# Patient Record
Sex: Female | Born: 1945 | Race: Black or African American | Hispanic: No | Smoking: Former smoker
Health system: Southern US, Community
[De-identification: ages and names within clinical notes are randomized; demographics above are authoritative.]

## PROBLEM LIST (undated history)

## (undated) DIAGNOSIS — J40 Bronchitis, not specified as acute or chronic: Secondary | ICD-10-CM

## (undated) DIAGNOSIS — E785 Hyperlipidemia, unspecified: Secondary | ICD-10-CM

## (undated) DIAGNOSIS — N186 End stage renal disease: Secondary | ICD-10-CM

## (undated) DIAGNOSIS — Z531 Procedure and treatment not carried out because of patient's decision for reasons of belief and group pressure: Secondary | ICD-10-CM

## (undated) DIAGNOSIS — E039 Hypothyroidism, unspecified: Secondary | ICD-10-CM

## (undated) DIAGNOSIS — M79606 Pain in leg, unspecified: Secondary | ICD-10-CM

## (undated) DIAGNOSIS — C801 Malignant (primary) neoplasm, unspecified: Secondary | ICD-10-CM

## (undated) DIAGNOSIS — I10 Essential (primary) hypertension: Secondary | ICD-10-CM

## (undated) DIAGNOSIS — R58 Hemorrhage, not elsewhere classified: Secondary | ICD-10-CM

## (undated) DIAGNOSIS — IMO0001 Reserved for inherently not codable concepts without codable children: Secondary | ICD-10-CM

## (undated) DIAGNOSIS — E079 Disorder of thyroid, unspecified: Secondary | ICD-10-CM

## (undated) DIAGNOSIS — I739 Peripheral vascular disease, unspecified: Secondary | ICD-10-CM

## (undated) DIAGNOSIS — J449 Chronic obstructive pulmonary disease, unspecified: Secondary | ICD-10-CM

## (undated) DIAGNOSIS — I639 Cerebral infarction, unspecified: Secondary | ICD-10-CM

## (undated) DIAGNOSIS — R197 Diarrhea, unspecified: Secondary | ICD-10-CM

## (undated) DIAGNOSIS — M1990 Unspecified osteoarthritis, unspecified site: Secondary | ICD-10-CM

## (undated) DIAGNOSIS — Z992 Dependence on renal dialysis: Secondary | ICD-10-CM

## (undated) HISTORY — PX: DG AV DIALYSIS SHUNT ACCESS EXIST*R* OR: HXRAD911

## (undated) HISTORY — DX: Cerebral infarction, unspecified: I63.9

## (undated) HISTORY — DX: Bronchitis, not specified as acute or chronic: J40

## (undated) HISTORY — DX: Pain in leg, unspecified: M79.606

## (undated) HISTORY — PX: CHOLECYSTECTOMY: SHX55

## (undated) HISTORY — DX: Hyperlipidemia, unspecified: E78.5

---

## 2000-09-28 ENCOUNTER — Ambulatory Visit (HOSPITAL_COMMUNITY): Admission: RE | Admit: 2000-09-28 | Discharge: 2000-09-28 | Payer: Self-pay | Admitting: Internal Medicine

## 2000-09-28 ENCOUNTER — Encounter (INDEPENDENT_AMBULATORY_CARE_PROVIDER_SITE_OTHER): Payer: Self-pay | Admitting: Internal Medicine

## 2000-09-29 ENCOUNTER — Encounter: Payer: Self-pay | Admitting: Internal Medicine

## 2000-10-16 ENCOUNTER — Ambulatory Visit (HOSPITAL_COMMUNITY): Admission: RE | Admit: 2000-10-16 | Discharge: 2000-10-16 | Payer: Self-pay | Admitting: Internal Medicine

## 2000-10-16 ENCOUNTER — Encounter: Payer: Self-pay | Admitting: Internal Medicine

## 2001-07-31 ENCOUNTER — Encounter: Payer: Self-pay | Admitting: Internal Medicine

## 2001-07-31 ENCOUNTER — Ambulatory Visit (HOSPITAL_COMMUNITY): Admission: RE | Admit: 2001-07-31 | Discharge: 2001-07-31 | Payer: Self-pay | Admitting: Internal Medicine

## 2004-12-29 ENCOUNTER — Ambulatory Visit (HOSPITAL_COMMUNITY): Admission: RE | Admit: 2004-12-29 | Discharge: 2004-12-29 | Payer: Self-pay | Admitting: Ophthalmology

## 2005-05-30 ENCOUNTER — Encounter (INDEPENDENT_AMBULATORY_CARE_PROVIDER_SITE_OTHER): Payer: Self-pay | Admitting: Internal Medicine

## 2005-05-30 LAB — CONVERTED CEMR LAB: Pap Smear: NORMAL

## 2005-06-02 ENCOUNTER — Encounter (INDEPENDENT_AMBULATORY_CARE_PROVIDER_SITE_OTHER): Payer: Self-pay | Admitting: Internal Medicine

## 2005-06-02 ENCOUNTER — Ambulatory Visit (HOSPITAL_COMMUNITY): Admission: RE | Admit: 2005-06-02 | Discharge: 2005-06-02 | Payer: Self-pay | Admitting: Obstetrics & Gynecology

## 2005-06-14 ENCOUNTER — Encounter (INDEPENDENT_AMBULATORY_CARE_PROVIDER_SITE_OTHER): Payer: Self-pay | Admitting: Internal Medicine

## 2005-06-14 ENCOUNTER — Ambulatory Visit (HOSPITAL_COMMUNITY): Admission: RE | Admit: 2005-06-14 | Discharge: 2005-06-14 | Payer: Self-pay | Admitting: Nephrology

## 2005-06-16 ENCOUNTER — Ambulatory Visit: Payer: Self-pay | Admitting: *Deleted

## 2005-06-21 ENCOUNTER — Ambulatory Visit (HOSPITAL_COMMUNITY): Admission: RE | Admit: 2005-06-21 | Discharge: 2005-06-22 | Payer: Self-pay | Admitting: Ophthalmology

## 2005-06-21 ENCOUNTER — Ambulatory Visit: Payer: Self-pay | Admitting: Cardiovascular Disease

## 2005-06-21 ENCOUNTER — Encounter (INDEPENDENT_AMBULATORY_CARE_PROVIDER_SITE_OTHER): Payer: Self-pay | Admitting: Internal Medicine

## 2005-06-24 ENCOUNTER — Ambulatory Visit: Payer: Self-pay | Admitting: *Deleted

## 2005-06-24 ENCOUNTER — Encounter (INDEPENDENT_AMBULATORY_CARE_PROVIDER_SITE_OTHER): Payer: Self-pay | Admitting: Internal Medicine

## 2005-06-24 ENCOUNTER — Encounter (HOSPITAL_COMMUNITY): Admission: RE | Admit: 2005-06-24 | Discharge: 2005-07-24 | Payer: Self-pay | Admitting: *Deleted

## 2005-07-04 ENCOUNTER — Encounter (INDEPENDENT_AMBULATORY_CARE_PROVIDER_SITE_OTHER): Payer: Self-pay | Admitting: Internal Medicine

## 2005-07-07 ENCOUNTER — Ambulatory Visit: Payer: Self-pay | Admitting: *Deleted

## 2005-07-07 ENCOUNTER — Encounter (INDEPENDENT_AMBULATORY_CARE_PROVIDER_SITE_OTHER): Payer: Self-pay | Admitting: Internal Medicine

## 2005-07-13 ENCOUNTER — Ambulatory Visit: Payer: Self-pay | Admitting: Internal Medicine

## 2005-07-14 ENCOUNTER — Telehealth (INDEPENDENT_AMBULATORY_CARE_PROVIDER_SITE_OTHER): Payer: Self-pay | Admitting: Internal Medicine

## 2005-07-15 ENCOUNTER — Ambulatory Visit: Payer: Self-pay | Admitting: Internal Medicine

## 2005-07-18 ENCOUNTER — Ambulatory Visit: Payer: Self-pay | Admitting: Internal Medicine

## 2005-07-19 ENCOUNTER — Ambulatory Visit (HOSPITAL_COMMUNITY): Admission: RE | Admit: 2005-07-19 | Discharge: 2005-07-20 | Payer: Self-pay | Admitting: Ophthalmology

## 2005-07-28 ENCOUNTER — Ambulatory Visit: Payer: Self-pay | Admitting: Internal Medicine

## 2005-07-29 ENCOUNTER — Encounter (INDEPENDENT_AMBULATORY_CARE_PROVIDER_SITE_OTHER): Payer: Self-pay | Admitting: Internal Medicine

## 2005-08-02 ENCOUNTER — Encounter (INDEPENDENT_AMBULATORY_CARE_PROVIDER_SITE_OTHER): Payer: Self-pay | Admitting: Internal Medicine

## 2005-08-03 ENCOUNTER — Encounter (INDEPENDENT_AMBULATORY_CARE_PROVIDER_SITE_OTHER): Payer: Self-pay | Admitting: Internal Medicine

## 2005-08-11 ENCOUNTER — Ambulatory Visit: Payer: Self-pay | Admitting: Internal Medicine

## 2005-08-12 ENCOUNTER — Encounter (INDEPENDENT_AMBULATORY_CARE_PROVIDER_SITE_OTHER): Payer: Self-pay | Admitting: Internal Medicine

## 2005-09-07 ENCOUNTER — Encounter (INDEPENDENT_AMBULATORY_CARE_PROVIDER_SITE_OTHER): Payer: Self-pay | Admitting: Internal Medicine

## 2005-09-08 ENCOUNTER — Ambulatory Visit: Payer: Self-pay | Admitting: Internal Medicine

## 2005-09-22 ENCOUNTER — Ambulatory Visit: Payer: Self-pay | Admitting: Internal Medicine

## 2005-09-27 ENCOUNTER — Encounter (INDEPENDENT_AMBULATORY_CARE_PROVIDER_SITE_OTHER): Payer: Self-pay | Admitting: Internal Medicine

## 2005-09-27 LAB — CONVERTED CEMR LAB: Hgb A1c MFr Bld: 8.8 %

## 2005-09-28 ENCOUNTER — Ambulatory Visit: Payer: Self-pay | Admitting: Internal Medicine

## 2005-09-28 ENCOUNTER — Encounter (INDEPENDENT_AMBULATORY_CARE_PROVIDER_SITE_OTHER): Payer: Self-pay | Admitting: Internal Medicine

## 2005-09-28 ENCOUNTER — Ambulatory Visit (HOSPITAL_COMMUNITY): Admission: RE | Admit: 2005-09-28 | Discharge: 2005-09-28 | Payer: Self-pay | Admitting: Internal Medicine

## 2005-10-06 ENCOUNTER — Ambulatory Visit: Payer: Self-pay | Admitting: Internal Medicine

## 2005-10-13 ENCOUNTER — Ambulatory Visit (HOSPITAL_COMMUNITY): Admission: RE | Admit: 2005-10-13 | Discharge: 2005-10-13 | Payer: Self-pay | Admitting: Internal Medicine

## 2005-10-13 ENCOUNTER — Ambulatory Visit: Payer: Self-pay | Admitting: Internal Medicine

## 2005-10-28 ENCOUNTER — Ambulatory Visit: Payer: Self-pay | Admitting: Internal Medicine

## 2005-11-01 ENCOUNTER — Ambulatory Visit: Payer: Self-pay | Admitting: Internal Medicine

## 2005-11-11 ENCOUNTER — Ambulatory Visit: Payer: Self-pay | Admitting: Internal Medicine

## 2005-11-25 ENCOUNTER — Ambulatory Visit: Payer: Self-pay | Admitting: Internal Medicine

## 2005-12-30 ENCOUNTER — Ambulatory Visit: Payer: Self-pay | Admitting: Internal Medicine

## 2006-01-03 ENCOUNTER — Encounter (INDEPENDENT_AMBULATORY_CARE_PROVIDER_SITE_OTHER): Payer: Self-pay | Admitting: Internal Medicine

## 2006-01-04 ENCOUNTER — Encounter (INDEPENDENT_AMBULATORY_CARE_PROVIDER_SITE_OTHER): Payer: Self-pay | Admitting: Internal Medicine

## 2006-01-27 ENCOUNTER — Ambulatory Visit: Payer: Self-pay | Admitting: Internal Medicine

## 2006-01-31 ENCOUNTER — Encounter (INDEPENDENT_AMBULATORY_CARE_PROVIDER_SITE_OTHER): Payer: Self-pay | Admitting: Internal Medicine

## 2006-02-03 ENCOUNTER — Encounter (INDEPENDENT_AMBULATORY_CARE_PROVIDER_SITE_OTHER): Payer: Self-pay | Admitting: Internal Medicine

## 2006-02-24 ENCOUNTER — Ambulatory Visit: Payer: Self-pay | Admitting: Internal Medicine

## 2006-03-08 ENCOUNTER — Encounter (INDEPENDENT_AMBULATORY_CARE_PROVIDER_SITE_OTHER): Payer: Self-pay | Admitting: Internal Medicine

## 2006-03-24 ENCOUNTER — Ambulatory Visit: Payer: Self-pay | Admitting: Internal Medicine

## 2006-03-30 ENCOUNTER — Ambulatory Visit: Payer: Self-pay | Admitting: Internal Medicine

## 2006-04-28 ENCOUNTER — Ambulatory Visit: Payer: Self-pay | Admitting: Internal Medicine

## 2006-05-03 ENCOUNTER — Ambulatory Visit (HOSPITAL_COMMUNITY): Admission: RE | Admit: 2006-05-03 | Discharge: 2006-05-03 | Payer: Self-pay | Admitting: Internal Medicine

## 2006-05-03 ENCOUNTER — Encounter: Payer: Self-pay | Admitting: Internal Medicine

## 2006-05-03 DIAGNOSIS — I10 Essential (primary) hypertension: Secondary | ICD-10-CM | POA: Insufficient documentation

## 2006-05-03 DIAGNOSIS — D509 Iron deficiency anemia, unspecified: Secondary | ICD-10-CM

## 2006-05-03 DIAGNOSIS — M129 Arthropathy, unspecified: Secondary | ICD-10-CM | POA: Insufficient documentation

## 2006-05-03 DIAGNOSIS — K5900 Constipation, unspecified: Secondary | ICD-10-CM | POA: Insufficient documentation

## 2006-05-03 DIAGNOSIS — R809 Proteinuria, unspecified: Secondary | ICD-10-CM | POA: Insufficient documentation

## 2006-05-03 DIAGNOSIS — I739 Peripheral vascular disease, unspecified: Secondary | ICD-10-CM | POA: Insufficient documentation

## 2006-05-03 DIAGNOSIS — G2581 Restless legs syndrome: Secondary | ICD-10-CM | POA: Insufficient documentation

## 2006-05-03 DIAGNOSIS — E042 Nontoxic multinodular goiter: Secondary | ICD-10-CM

## 2006-05-03 DIAGNOSIS — E785 Hyperlipidemia, unspecified: Secondary | ICD-10-CM | POA: Insufficient documentation

## 2006-05-03 DIAGNOSIS — Z8719 Personal history of other diseases of the digestive system: Secondary | ICD-10-CM

## 2006-05-26 ENCOUNTER — Ambulatory Visit: Payer: Self-pay | Admitting: Internal Medicine

## 2006-06-12 ENCOUNTER — Encounter (INDEPENDENT_AMBULATORY_CARE_PROVIDER_SITE_OTHER): Payer: Self-pay | Admitting: Internal Medicine

## 2006-06-16 ENCOUNTER — Encounter (INDEPENDENT_AMBULATORY_CARE_PROVIDER_SITE_OTHER): Payer: Self-pay | Admitting: Internal Medicine

## 2006-06-30 ENCOUNTER — Ambulatory Visit: Payer: Self-pay | Admitting: Internal Medicine

## 2006-07-01 ENCOUNTER — Encounter (INDEPENDENT_AMBULATORY_CARE_PROVIDER_SITE_OTHER): Payer: Self-pay | Admitting: Internal Medicine

## 2006-07-01 LAB — CONVERTED CEMR LAB
AST: 14 units/L (ref 0–37)
BUN: 30 mg/dL — ABNORMAL HIGH (ref 6–23)
Basophils Relative: 1 % (ref 0–1)
Calcium: 8.3 mg/dL — ABNORMAL LOW (ref 8.4–10.5)
Chloride: 103 meq/L (ref 96–112)
Creatinine, Ser: 1.48 mg/dL — ABNORMAL HIGH (ref 0.40–1.20)
Eosinophils Relative: 2 % (ref 0–5)
Ferritin: 82 ng/mL (ref 10–291)
HCT: 33.9 % — ABNORMAL LOW (ref 36.0–46.0)
Hemoglobin: 10.1 g/dL — ABNORMAL LOW (ref 12.0–15.0)
MCHC: 29.8 g/dL — ABNORMAL LOW (ref 30.0–36.0)
MCV: 74.5 fL — ABNORMAL LOW (ref 78.0–100.0)
Monocytes Absolute: 0.5 10*3/uL (ref 0.2–0.7)
Monocytes Relative: 6 % (ref 3–11)
Neutro Abs: 5.2 10*3/uL (ref 1.7–7.7)
RBC: 4.55 M/uL (ref 3.87–5.11)

## 2006-07-28 ENCOUNTER — Ambulatory Visit: Payer: Self-pay | Admitting: Internal Medicine

## 2006-07-28 DIAGNOSIS — N6019 Diffuse cystic mastopathy of unspecified breast: Secondary | ICD-10-CM | POA: Insufficient documentation

## 2006-07-28 DIAGNOSIS — M25519 Pain in unspecified shoulder: Secondary | ICD-10-CM

## 2006-08-02 ENCOUNTER — Encounter (INDEPENDENT_AMBULATORY_CARE_PROVIDER_SITE_OTHER): Payer: Self-pay | Admitting: Internal Medicine

## 2006-08-07 ENCOUNTER — Ambulatory Visit (HOSPITAL_COMMUNITY): Admission: RE | Admit: 2006-08-07 | Discharge: 2006-08-07 | Payer: Self-pay | Admitting: Internal Medicine

## 2006-08-08 ENCOUNTER — Encounter (INDEPENDENT_AMBULATORY_CARE_PROVIDER_SITE_OTHER): Payer: Self-pay | Admitting: Internal Medicine

## 2006-08-08 DIAGNOSIS — M719 Bursopathy, unspecified: Secondary | ICD-10-CM

## 2006-08-08 DIAGNOSIS — M67919 Unspecified disorder of synovium and tendon, unspecified shoulder: Secondary | ICD-10-CM | POA: Insufficient documentation

## 2006-08-14 ENCOUNTER — Ambulatory Visit (HOSPITAL_COMMUNITY): Admission: RE | Admit: 2006-08-14 | Discharge: 2006-08-14 | Payer: Self-pay | Admitting: Ophthalmology

## 2006-08-16 ENCOUNTER — Encounter (INDEPENDENT_AMBULATORY_CARE_PROVIDER_SITE_OTHER): Payer: Self-pay | Admitting: Internal Medicine

## 2006-08-17 ENCOUNTER — Ambulatory Visit: Payer: Self-pay | Admitting: Orthopedic Surgery

## 2006-08-22 ENCOUNTER — Encounter (HOSPITAL_COMMUNITY): Admission: RE | Admit: 2006-08-22 | Discharge: 2006-09-21 | Payer: Self-pay | Admitting: Orthopedic Surgery

## 2006-09-07 ENCOUNTER — Encounter (INDEPENDENT_AMBULATORY_CARE_PROVIDER_SITE_OTHER): Payer: Self-pay | Admitting: Internal Medicine

## 2006-09-20 ENCOUNTER — Encounter: Payer: Self-pay | Admitting: Internal Medicine

## 2006-09-22 ENCOUNTER — Ambulatory Visit: Payer: Self-pay | Admitting: Internal Medicine

## 2006-09-22 DIAGNOSIS — J209 Acute bronchitis, unspecified: Secondary | ICD-10-CM | POA: Insufficient documentation

## 2006-09-26 ENCOUNTER — Encounter (HOSPITAL_COMMUNITY): Admission: RE | Admit: 2006-09-26 | Discharge: 2006-10-26 | Payer: Self-pay | Admitting: Orthopedic Surgery

## 2006-10-05 ENCOUNTER — Ambulatory Visit: Payer: Self-pay | Admitting: Orthopedic Surgery

## 2006-10-05 ENCOUNTER — Encounter (INDEPENDENT_AMBULATORY_CARE_PROVIDER_SITE_OTHER): Payer: Self-pay | Admitting: Internal Medicine

## 2006-10-20 ENCOUNTER — Ambulatory Visit: Payer: Self-pay | Admitting: Internal Medicine

## 2006-10-20 DIAGNOSIS — R609 Edema, unspecified: Secondary | ICD-10-CM

## 2006-11-17 ENCOUNTER — Ambulatory Visit: Payer: Self-pay | Admitting: Internal Medicine

## 2006-12-29 ENCOUNTER — Ambulatory Visit: Payer: Self-pay | Admitting: Internal Medicine

## 2007-01-08 ENCOUNTER — Ambulatory Visit (HOSPITAL_COMMUNITY): Admission: RE | Admit: 2007-01-08 | Discharge: 2007-01-08 | Payer: Self-pay | Admitting: Ophthalmology

## 2007-02-14 ENCOUNTER — Ambulatory Visit: Payer: Self-pay | Admitting: Internal Medicine

## 2007-02-16 ENCOUNTER — Encounter (INDEPENDENT_AMBULATORY_CARE_PROVIDER_SITE_OTHER): Payer: Self-pay | Admitting: Internal Medicine

## 2007-02-16 DIAGNOSIS — N189 Chronic kidney disease, unspecified: Secondary | ICD-10-CM | POA: Insufficient documentation

## 2007-02-16 LAB — CONVERTED CEMR LAB
Albumin: 3.2 g/dL — ABNORMAL LOW (ref 3.5–5.2)
CO2: 24 meq/L (ref 19–32)
Cholesterol: 189 mg/dL (ref 0–200)
Glucose, Bld: 161 mg/dL — ABNORMAL HIGH (ref 70–99)
Iron: 46 ug/dL (ref 42–145)
LDL Cholesterol: 101 mg/dL — ABNORMAL HIGH (ref 0–99)
Lymphocytes Relative: 39 % (ref 12–46)
Lymphs Abs: 2.2 10*3/uL (ref 0.7–3.3)
Neutro Abs: 3 10*3/uL (ref 1.7–7.7)
Neutrophils Relative %: 54 % (ref 43–77)
Platelets: 224 10*3/uL (ref 150–400)
Potassium: 4.3 meq/L (ref 3.5–5.3)
Saturation Ratios: 17 % — ABNORMAL LOW (ref 20–55)
Sodium: 141 meq/L (ref 135–145)
Total Protein: 6.3 g/dL (ref 6.0–8.3)
Triglycerides: 264 mg/dL — ABNORMAL HIGH (ref <150)
UIBC: 223 ug/dL
WBC: 5.6 10*3/uL (ref 4.0–10.5)

## 2007-02-19 ENCOUNTER — Telehealth (INDEPENDENT_AMBULATORY_CARE_PROVIDER_SITE_OTHER): Payer: Self-pay | Admitting: *Deleted

## 2007-02-26 ENCOUNTER — Encounter (INDEPENDENT_AMBULATORY_CARE_PROVIDER_SITE_OTHER): Payer: Self-pay | Admitting: Internal Medicine

## 2007-02-28 LAB — CONVERTED CEMR LAB
BUN: 32 mg/dL — ABNORMAL HIGH (ref 6–23)
Chloride: 110 meq/L (ref 96–112)
Glucose, Bld: 112 mg/dL — ABNORMAL HIGH (ref 70–99)
Phosphorus: 4.8 mg/dL — ABNORMAL HIGH (ref 2.3–4.6)
Potassium: 3.9 meq/L (ref 3.5–5.3)

## 2007-03-14 ENCOUNTER — Telehealth (INDEPENDENT_AMBULATORY_CARE_PROVIDER_SITE_OTHER): Payer: Self-pay | Admitting: *Deleted

## 2007-03-21 ENCOUNTER — Ambulatory Visit: Payer: Self-pay | Admitting: Internal Medicine

## 2007-03-30 ENCOUNTER — Encounter (INDEPENDENT_AMBULATORY_CARE_PROVIDER_SITE_OTHER): Payer: Self-pay | Admitting: Internal Medicine

## 2007-04-02 ENCOUNTER — Telehealth (INDEPENDENT_AMBULATORY_CARE_PROVIDER_SITE_OTHER): Payer: Self-pay | Admitting: *Deleted

## 2007-04-02 ENCOUNTER — Ambulatory Visit: Payer: Self-pay | Admitting: Internal Medicine

## 2007-04-02 DIAGNOSIS — R9431 Abnormal electrocardiogram [ECG] [EKG]: Secondary | ICD-10-CM

## 2007-04-02 DIAGNOSIS — R252 Cramp and spasm: Secondary | ICD-10-CM

## 2007-04-02 LAB — CONVERTED CEMR LAB
CO2: 26 meq/L (ref 19–32)
Chloride: 97 meq/L (ref 96–112)
Collection Interval-CRCL: 24 hr
Creatinine 24 HR UR: 712 mg/24hr (ref 700–1800)
Creatinine Clearance: 25 mL/min — ABNORMAL LOW (ref 75–115)
Creatinine, Ser: 2.03 mg/dL — ABNORMAL HIGH (ref 0.40–1.20)
Creatinine, Urine: 43.2 mg/dL
Potassium: 4.1 meq/L (ref 3.5–5.3)
Protein, Ur: 3614 mg/24hr — ABNORMAL HIGH (ref 50–100)

## 2007-04-06 ENCOUNTER — Ambulatory Visit: Payer: Self-pay | Admitting: Cardiovascular Disease

## 2007-04-09 ENCOUNTER — Ambulatory Visit: Payer: Self-pay | Admitting: Cardiology

## 2007-04-09 ENCOUNTER — Ambulatory Visit (HOSPITAL_COMMUNITY): Admission: RE | Admit: 2007-04-09 | Discharge: 2007-04-09 | Payer: Self-pay | Admitting: Cardiovascular Disease

## 2007-04-09 ENCOUNTER — Ambulatory Visit: Payer: Self-pay | Admitting: Internal Medicine

## 2007-04-09 DIAGNOSIS — R55 Syncope and collapse: Secondary | ICD-10-CM | POA: Insufficient documentation

## 2007-04-10 ENCOUNTER — Encounter: Payer: Self-pay | Admitting: Internal Medicine

## 2007-04-14 ENCOUNTER — Other Ambulatory Visit: Payer: Self-pay | Admitting: Emergency Medicine

## 2007-04-14 ENCOUNTER — Inpatient Hospital Stay (HOSPITAL_COMMUNITY): Admission: AD | Admit: 2007-04-14 | Discharge: 2007-04-17 | Payer: Self-pay | Admitting: Cardiology

## 2007-04-14 ENCOUNTER — Other Ambulatory Visit: Payer: Self-pay

## 2007-04-14 ENCOUNTER — Ambulatory Visit: Payer: Self-pay | Admitting: *Deleted

## 2007-04-14 ENCOUNTER — Encounter (INDEPENDENT_AMBULATORY_CARE_PROVIDER_SITE_OTHER): Payer: Self-pay | Admitting: Internal Medicine

## 2007-04-14 LAB — CONVERTED CEMR LAB
Lymphs Abs: 2.1 10*3/uL
Monocytes Relative: 5 %
Neutro Abs: 3.7 10*3/uL
Neutrophils Relative %: 60 %
Platelets: 187 10*3/uL
RBC: 5.1 M/uL
WBC: 6.3 10*3/uL

## 2007-04-16 ENCOUNTER — Encounter: Payer: Self-pay | Admitting: Cardiology

## 2007-04-16 ENCOUNTER — Encounter (INDEPENDENT_AMBULATORY_CARE_PROVIDER_SITE_OTHER): Payer: Self-pay | Admitting: Internal Medicine

## 2007-04-17 ENCOUNTER — Encounter (INDEPENDENT_AMBULATORY_CARE_PROVIDER_SITE_OTHER): Payer: Self-pay | Admitting: Internal Medicine

## 2007-04-17 LAB — CONVERTED CEMR LAB
BUN: 63 mg/dL
Calcium: 8.6 mg/dL
Creatinine, Ser: 1.85 mg/dL
Glucose, Bld: 232 mg/dL

## 2007-04-18 ENCOUNTER — Ambulatory Visit: Payer: Self-pay | Admitting: Internal Medicine

## 2007-04-19 ENCOUNTER — Ambulatory Visit: Payer: Self-pay

## 2007-04-19 ENCOUNTER — Encounter (INDEPENDENT_AMBULATORY_CARE_PROVIDER_SITE_OTHER): Payer: Self-pay | Admitting: Internal Medicine

## 2007-04-24 ENCOUNTER — Encounter (INDEPENDENT_AMBULATORY_CARE_PROVIDER_SITE_OTHER): Payer: Self-pay | Admitting: Internal Medicine

## 2007-05-02 ENCOUNTER — Ambulatory Visit: Payer: Self-pay | Admitting: Internal Medicine

## 2007-05-03 ENCOUNTER — Encounter (INDEPENDENT_AMBULATORY_CARE_PROVIDER_SITE_OTHER): Payer: Self-pay | Admitting: Internal Medicine

## 2007-05-03 LAB — CONVERTED CEMR LAB
Albumin: 3 g/dL — ABNORMAL LOW (ref 3.5–5.2)
Basophils Relative: 1 % (ref 0–1)
CO2: 22 meq/L (ref 19–32)
Calcium: 8.3 mg/dL — ABNORMAL LOW (ref 8.4–10.5)
Ferritin: 76 ng/mL (ref 10–291)
HCT: 31 % — ABNORMAL LOW (ref 36.0–46.0)
HDL: 34 mg/dL — ABNORMAL LOW (ref >39)
Iron: 45 ug/dL (ref 42–145)
LDL Cholesterol: 63 mg/dL (ref 0–99)
MCHC: 31.3 g/dL (ref 30.0–36.0)
Monocytes Absolute: 0.3 10*3/uL (ref 0.1–1.0)
Monocytes Relative: 5 % (ref 3–12)
Neutro Abs: 3.2 10*3/uL (ref 1.7–7.7)
Phosphorus: 5.5 mg/dL — ABNORMAL HIGH (ref 2.3–4.6)
Saturation Ratios: 17 % — ABNORMAL LOW (ref 20–55)
Sodium: 139 meq/L (ref 135–145)
TSH: 0.465 microintl units/mL (ref 0.350–5.50)
Total CHOL/HDL Ratio: 3.9
UIBC: 227 ug/dL
WBC: 6.1 10*3/uL (ref 4.0–10.5)

## 2007-05-09 ENCOUNTER — Ambulatory Visit: Payer: Self-pay | Admitting: Cardiovascular Disease

## 2007-05-10 ENCOUNTER — Ambulatory Visit: Payer: Self-pay

## 2007-05-28 ENCOUNTER — Telehealth (INDEPENDENT_AMBULATORY_CARE_PROVIDER_SITE_OTHER): Payer: Self-pay | Admitting: *Deleted

## 2007-05-28 ENCOUNTER — Encounter (INDEPENDENT_AMBULATORY_CARE_PROVIDER_SITE_OTHER): Payer: Self-pay | Admitting: Internal Medicine

## 2007-05-30 ENCOUNTER — Ambulatory Visit: Payer: Self-pay | Admitting: Internal Medicine

## 2007-05-30 DIAGNOSIS — E1165 Type 2 diabetes mellitus with hyperglycemia: Secondary | ICD-10-CM

## 2007-06-13 ENCOUNTER — Encounter (HOSPITAL_COMMUNITY): Admission: RE | Admit: 2007-06-13 | Discharge: 2007-09-11 | Payer: Self-pay | Admitting: Nephrology

## 2007-06-14 ENCOUNTER — Encounter (INDEPENDENT_AMBULATORY_CARE_PROVIDER_SITE_OTHER): Payer: Self-pay | Admitting: Internal Medicine

## 2007-06-18 ENCOUNTER — Ambulatory Visit: Payer: Self-pay | Admitting: Internal Medicine

## 2007-06-18 DIAGNOSIS — L02419 Cutaneous abscess of limb, unspecified: Secondary | ICD-10-CM

## 2007-06-18 DIAGNOSIS — L03119 Cellulitis of unspecified part of limb: Secondary | ICD-10-CM

## 2007-06-19 ENCOUNTER — Telehealth (INDEPENDENT_AMBULATORY_CARE_PROVIDER_SITE_OTHER): Payer: Self-pay | Admitting: Internal Medicine

## 2007-06-29 ENCOUNTER — Encounter (INDEPENDENT_AMBULATORY_CARE_PROVIDER_SITE_OTHER): Payer: Self-pay | Admitting: Internal Medicine

## 2007-07-11 ENCOUNTER — Ambulatory Visit: Payer: Self-pay | Admitting: Internal Medicine

## 2007-08-03 ENCOUNTER — Encounter (INDEPENDENT_AMBULATORY_CARE_PROVIDER_SITE_OTHER): Payer: Self-pay | Admitting: Internal Medicine

## 2007-08-09 ENCOUNTER — Encounter (INDEPENDENT_AMBULATORY_CARE_PROVIDER_SITE_OTHER): Payer: Self-pay | Admitting: Internal Medicine

## 2007-08-14 ENCOUNTER — Telehealth (INDEPENDENT_AMBULATORY_CARE_PROVIDER_SITE_OTHER): Payer: Self-pay | Admitting: Internal Medicine

## 2007-08-22 ENCOUNTER — Ambulatory Visit: Payer: Self-pay | Admitting: Internal Medicine

## 2007-08-23 ENCOUNTER — Telehealth (INDEPENDENT_AMBULATORY_CARE_PROVIDER_SITE_OTHER): Payer: Self-pay | Admitting: Internal Medicine

## 2007-08-29 ENCOUNTER — Ambulatory Visit (HOSPITAL_COMMUNITY): Admission: RE | Admit: 2007-08-29 | Discharge: 2007-08-29 | Payer: Self-pay | Admitting: Internal Medicine

## 2007-08-31 ENCOUNTER — Telehealth (INDEPENDENT_AMBULATORY_CARE_PROVIDER_SITE_OTHER): Payer: Self-pay | Admitting: *Deleted

## 2007-09-04 ENCOUNTER — Encounter (INDEPENDENT_AMBULATORY_CARE_PROVIDER_SITE_OTHER): Payer: Self-pay | Admitting: Internal Medicine

## 2007-09-14 ENCOUNTER — Ambulatory Visit: Payer: Self-pay | Admitting: Internal Medicine

## 2007-09-14 DIAGNOSIS — M545 Low back pain: Secondary | ICD-10-CM

## 2007-09-14 DIAGNOSIS — J310 Chronic rhinitis: Secondary | ICD-10-CM

## 2007-09-27 ENCOUNTER — Encounter (HOSPITAL_COMMUNITY): Admission: RE | Admit: 2007-09-27 | Discharge: 2007-12-26 | Payer: Self-pay | Admitting: Nephrology

## 2007-10-03 ENCOUNTER — Ambulatory Visit: Payer: Self-pay | Admitting: Internal Medicine

## 2007-10-03 DIAGNOSIS — R05 Cough: Secondary | ICD-10-CM

## 2007-10-03 DIAGNOSIS — R059 Cough, unspecified: Secondary | ICD-10-CM | POA: Insufficient documentation

## 2007-11-12 ENCOUNTER — Ambulatory Visit: Payer: Self-pay | Admitting: Internal Medicine

## 2007-11-12 DIAGNOSIS — R5381 Other malaise: Secondary | ICD-10-CM | POA: Insufficient documentation

## 2007-11-12 DIAGNOSIS — L851 Acquired keratosis [keratoderma] palmaris et plantaris: Secondary | ICD-10-CM

## 2007-12-04 ENCOUNTER — Encounter (INDEPENDENT_AMBULATORY_CARE_PROVIDER_SITE_OTHER): Payer: Self-pay | Admitting: Internal Medicine

## 2007-12-12 ENCOUNTER — Ambulatory Visit (HOSPITAL_COMMUNITY): Admission: RE | Admit: 2007-12-12 | Discharge: 2007-12-12 | Payer: Self-pay | Admitting: Internal Medicine

## 2007-12-12 ENCOUNTER — Ambulatory Visit: Payer: Self-pay | Admitting: Internal Medicine

## 2007-12-28 ENCOUNTER — Telehealth (INDEPENDENT_AMBULATORY_CARE_PROVIDER_SITE_OTHER): Payer: Self-pay | Admitting: *Deleted

## 2008-01-01 ENCOUNTER — Encounter (HOSPITAL_COMMUNITY): Admission: RE | Admit: 2008-01-01 | Discharge: 2008-03-31 | Payer: Self-pay | Admitting: Nephrology

## 2008-01-07 ENCOUNTER — Encounter (INDEPENDENT_AMBULATORY_CARE_PROVIDER_SITE_OTHER): Payer: Self-pay | Admitting: Internal Medicine

## 2008-02-15 ENCOUNTER — Telehealth (INDEPENDENT_AMBULATORY_CARE_PROVIDER_SITE_OTHER): Payer: Self-pay | Admitting: *Deleted

## 2008-03-04 ENCOUNTER — Ambulatory Visit: Payer: Self-pay | Admitting: Internal Medicine

## 2008-03-06 ENCOUNTER — Encounter (INDEPENDENT_AMBULATORY_CARE_PROVIDER_SITE_OTHER): Payer: Self-pay | Admitting: Internal Medicine

## 2008-04-02 ENCOUNTER — Ambulatory Visit: Payer: Self-pay | Admitting: Internal Medicine

## 2008-04-02 DIAGNOSIS — R109 Unspecified abdominal pain: Secondary | ICD-10-CM | POA: Insufficient documentation

## 2008-04-02 DIAGNOSIS — J069 Acute upper respiratory infection, unspecified: Secondary | ICD-10-CM | POA: Insufficient documentation

## 2008-04-03 ENCOUNTER — Telehealth (INDEPENDENT_AMBULATORY_CARE_PROVIDER_SITE_OTHER): Payer: Self-pay | Admitting: *Deleted

## 2008-04-03 ENCOUNTER — Encounter (INDEPENDENT_AMBULATORY_CARE_PROVIDER_SITE_OTHER): Payer: Self-pay | Admitting: Internal Medicine

## 2008-04-03 LAB — CONVERTED CEMR LAB
ALT: 17 units/L (ref 0–35)
AST: 15 units/L (ref 0–37)
BUN: 41 mg/dL — ABNORMAL HIGH (ref 6–23)
Basophils Absolute: 0 10*3/uL (ref 0.0–0.1)
Basophils Relative: 1 % (ref 0–1)
CO2: 22 meq/L (ref 19–32)
Creatinine, Ser: 2.13 mg/dL — ABNORMAL HIGH (ref 0.40–1.20)
Eosinophils Relative: 2 % (ref 0–5)
HCT: 36.2 % (ref 36.0–46.0)
Hemoglobin: 11.3 g/dL — ABNORMAL LOW (ref 12.0–15.0)
MCHC: 31.2 g/dL (ref 30.0–36.0)
Monocytes Absolute: 0.5 10*3/uL (ref 0.1–1.0)
RDW: 15.8 % — ABNORMAL HIGH (ref 11.5–15.5)
Total Bilirubin: 0.4 mg/dL (ref 0.3–1.2)

## 2008-04-08 LAB — CONVERTED CEMR LAB
Ferritin: 363 ng/mL — ABNORMAL HIGH (ref 10–291)
Iron: 47 ug/dL (ref 42–145)
Saturation Ratios: 18 % — ABNORMAL LOW (ref 20–55)
TIBC: 268 ug/dL (ref 250–470)

## 2008-04-09 ENCOUNTER — Encounter (INDEPENDENT_AMBULATORY_CARE_PROVIDER_SITE_OTHER): Payer: Self-pay | Admitting: Internal Medicine

## 2008-04-10 ENCOUNTER — Encounter (INDEPENDENT_AMBULATORY_CARE_PROVIDER_SITE_OTHER): Payer: Self-pay | Admitting: Internal Medicine

## 2008-04-14 ENCOUNTER — Ambulatory Visit: Payer: Self-pay | Admitting: Internal Medicine

## 2008-04-14 DIAGNOSIS — D631 Anemia in chronic kidney disease: Secondary | ICD-10-CM | POA: Insufficient documentation

## 2008-04-14 DIAGNOSIS — N039 Chronic nephritic syndrome with unspecified morphologic changes: Secondary | ICD-10-CM

## 2008-04-16 LAB — CONVERTED CEMR LAB
ALT: 17 units/L (ref 0–35)
AST: 13 units/L (ref 0–37)
Albumin: 3.2 g/dL — ABNORMAL LOW (ref 3.5–5.2)
Alkaline Phosphatase: 94 units/L (ref 39–117)
Basophils Absolute: 0 10*3/uL (ref 0.0–0.1)
Basophils Relative: 0 % (ref 0–1)
Eosinophils Absolute: 0.1 10*3/uL (ref 0.0–0.7)
Eosinophils Relative: 2 % (ref 0–5)
Glucose, Bld: 102 mg/dL — ABNORMAL HIGH (ref 70–99)
LDL Cholesterol: 61 mg/dL (ref 0–99)
Lymphs Abs: 2.1 10*3/uL (ref 0.7–4.0)
MCV: 75.7 fL — ABNORMAL LOW (ref 78.0–100.0)
Neutrophils Relative %: 54 % (ref 43–77)
Platelets: 184 10*3/uL (ref 150–400)
Potassium: 4.2 meq/L (ref 3.5–5.3)
RDW: 16 % — ABNORMAL HIGH (ref 11.5–15.5)
Sodium: 141 meq/L (ref 135–145)
Total Bilirubin: 0.3 mg/dL (ref 0.3–1.2)
Total Protein: 6.6 g/dL (ref 6.0–8.3)
VLDL: 74 mg/dL — ABNORMAL HIGH (ref 0–40)
WBC: 5.7 10*3/uL (ref 4.0–10.5)

## 2008-04-17 ENCOUNTER — Encounter (INDEPENDENT_AMBULATORY_CARE_PROVIDER_SITE_OTHER): Payer: Self-pay | Admitting: Internal Medicine

## 2008-04-18 ENCOUNTER — Encounter (INDEPENDENT_AMBULATORY_CARE_PROVIDER_SITE_OTHER): Payer: Self-pay | Admitting: Internal Medicine

## 2008-04-18 ENCOUNTER — Telehealth (INDEPENDENT_AMBULATORY_CARE_PROVIDER_SITE_OTHER): Payer: Self-pay | Admitting: *Deleted

## 2008-04-29 ENCOUNTER — Ambulatory Visit: Payer: Self-pay | Admitting: Internal Medicine

## 2008-04-29 LAB — CONVERTED CEMR LAB: Hemoglobin: 10.1 g/dL

## 2008-05-02 ENCOUNTER — Ambulatory Visit (HOSPITAL_COMMUNITY): Payer: Self-pay | Admitting: Internal Medicine

## 2008-05-02 ENCOUNTER — Encounter (HOSPITAL_COMMUNITY): Admission: RE | Admit: 2008-05-02 | Discharge: 2008-05-27 | Payer: Self-pay | Admitting: Internal Medicine

## 2008-05-19 ENCOUNTER — Encounter (INDEPENDENT_AMBULATORY_CARE_PROVIDER_SITE_OTHER): Payer: Self-pay | Admitting: Internal Medicine

## 2008-05-27 ENCOUNTER — Telehealth (INDEPENDENT_AMBULATORY_CARE_PROVIDER_SITE_OTHER): Payer: Self-pay | Admitting: Internal Medicine

## 2008-06-03 ENCOUNTER — Ambulatory Visit: Payer: Self-pay | Admitting: Internal Medicine

## 2008-06-09 ENCOUNTER — Encounter (INDEPENDENT_AMBULATORY_CARE_PROVIDER_SITE_OTHER): Payer: Self-pay | Admitting: Internal Medicine

## 2008-06-23 ENCOUNTER — Encounter (INDEPENDENT_AMBULATORY_CARE_PROVIDER_SITE_OTHER): Payer: Self-pay | Admitting: Internal Medicine

## 2008-07-01 ENCOUNTER — Ambulatory Visit: Payer: Self-pay | Admitting: Internal Medicine

## 2008-07-29 ENCOUNTER — Ambulatory Visit: Payer: Self-pay | Admitting: Internal Medicine

## 2008-07-29 DIAGNOSIS — N185 Chronic kidney disease, stage 5: Secondary | ICD-10-CM | POA: Insufficient documentation

## 2008-07-29 LAB — CONVERTED CEMR LAB
Blood Glucose, Fingerstick: 248
Hemoglobin: 11.8 g/dL

## 2008-08-21 ENCOUNTER — Encounter (INDEPENDENT_AMBULATORY_CARE_PROVIDER_SITE_OTHER): Payer: Self-pay | Admitting: Internal Medicine

## 2008-09-01 ENCOUNTER — Ambulatory Visit: Payer: Self-pay | Admitting: Internal Medicine

## 2008-09-01 ENCOUNTER — Telehealth (INDEPENDENT_AMBULATORY_CARE_PROVIDER_SITE_OTHER): Payer: Self-pay | Admitting: Internal Medicine

## 2008-09-04 ENCOUNTER — Encounter (INDEPENDENT_AMBULATORY_CARE_PROVIDER_SITE_OTHER): Payer: Self-pay | Admitting: Internal Medicine

## 2008-09-17 ENCOUNTER — Telehealth (INDEPENDENT_AMBULATORY_CARE_PROVIDER_SITE_OTHER): Payer: Self-pay | Admitting: Internal Medicine

## 2008-09-22 ENCOUNTER — Encounter (INDEPENDENT_AMBULATORY_CARE_PROVIDER_SITE_OTHER): Payer: Self-pay | Admitting: Internal Medicine

## 2008-09-29 ENCOUNTER — Ambulatory Visit: Payer: Self-pay | Admitting: Internal Medicine

## 2008-10-03 ENCOUNTER — Encounter (INDEPENDENT_AMBULATORY_CARE_PROVIDER_SITE_OTHER): Payer: Self-pay | Admitting: Internal Medicine

## 2008-10-07 ENCOUNTER — Encounter (INDEPENDENT_AMBULATORY_CARE_PROVIDER_SITE_OTHER): Payer: Self-pay | Admitting: Internal Medicine

## 2008-10-21 LAB — CONVERTED CEMR LAB
ALT: 17 units/L
Albumin: 3.6 g/dL
Alkaline Phosphatase: 100 units/L
CO2: 25 meq/L
Cholesterol: 214 mg/dL
Glucose, Bld: 184 mg/dL
HDL: 31 mg/dL
Lymphocytes Relative: 37 %
MCHC: 31.9 g/dL
MCV: 72 fL
Magnesium: 1.9 mg/dL
Neutrophils Relative %: 55 %
Phosphorus: 4 mg/dL
Platelets: 205 10*3/uL
Potassium: 4.6 meq/L
RBC: 42 M/uL
Saturation Ratios: 29 %
Sodium: 141 meq/L
TIBC: 248 ug/dL
Total Bilirubin: 0.2 mg/dL
Total Protein: 7.4 g/dL
Triglycerides: 514 mg/dL

## 2008-10-28 ENCOUNTER — Ambulatory Visit: Payer: Self-pay | Admitting: Internal Medicine

## 2008-10-28 LAB — CONVERTED CEMR LAB: Hgb A1c MFr Bld: 8 %

## 2008-11-05 ENCOUNTER — Encounter (INDEPENDENT_AMBULATORY_CARE_PROVIDER_SITE_OTHER): Payer: Self-pay | Admitting: Internal Medicine

## 2008-11-10 ENCOUNTER — Encounter (INDEPENDENT_AMBULATORY_CARE_PROVIDER_SITE_OTHER): Payer: Self-pay | Admitting: Internal Medicine

## 2008-11-17 ENCOUNTER — Telehealth (INDEPENDENT_AMBULATORY_CARE_PROVIDER_SITE_OTHER): Payer: Self-pay | Admitting: Internal Medicine

## 2008-11-25 ENCOUNTER — Encounter (INDEPENDENT_AMBULATORY_CARE_PROVIDER_SITE_OTHER): Payer: Self-pay | Admitting: Internal Medicine

## 2008-11-26 ENCOUNTER — Ambulatory Visit: Payer: Self-pay | Admitting: Internal Medicine

## 2008-12-24 ENCOUNTER — Ambulatory Visit: Payer: Self-pay | Admitting: Internal Medicine

## 2009-01-07 ENCOUNTER — Ambulatory Visit: Payer: Self-pay | Admitting: Internal Medicine

## 2009-01-07 LAB — CONVERTED CEMR LAB: Hemoglobin: 9.7 g/dL

## 2009-01-12 ENCOUNTER — Emergency Department (HOSPITAL_COMMUNITY): Admission: EM | Admit: 2009-01-12 | Discharge: 2009-01-12 | Payer: Self-pay | Admitting: Emergency Medicine

## 2009-02-03 ENCOUNTER — Encounter (INDEPENDENT_AMBULATORY_CARE_PROVIDER_SITE_OTHER): Payer: Self-pay | Admitting: Internal Medicine

## 2009-02-04 ENCOUNTER — Ambulatory Visit: Payer: Self-pay | Admitting: Internal Medicine

## 2009-02-04 LAB — CONVERTED CEMR LAB
Blood Glucose, Fingerstick: 84
Hemoglobin: 9.5 g/dL
Hgb A1c MFr Bld: 8.2 %

## 2009-02-06 ENCOUNTER — Telehealth (INDEPENDENT_AMBULATORY_CARE_PROVIDER_SITE_OTHER): Payer: Self-pay | Admitting: *Deleted

## 2009-02-12 ENCOUNTER — Encounter (INDEPENDENT_AMBULATORY_CARE_PROVIDER_SITE_OTHER): Payer: Self-pay | Admitting: Internal Medicine

## 2009-03-06 ENCOUNTER — Encounter (HOSPITAL_COMMUNITY): Admission: RE | Admit: 2009-03-06 | Discharge: 2009-06-04 | Payer: Self-pay | Admitting: Nephrology

## 2009-03-16 ENCOUNTER — Emergency Department (HOSPITAL_COMMUNITY): Admission: EM | Admit: 2009-03-16 | Discharge: 2009-03-16 | Payer: Self-pay | Admitting: Emergency Medicine

## 2009-04-15 ENCOUNTER — Ambulatory Visit (HOSPITAL_COMMUNITY): Admission: RE | Admit: 2009-04-15 | Discharge: 2009-04-15 | Payer: Self-pay | Admitting: Internal Medicine

## 2009-06-30 ENCOUNTER — Encounter (HOSPITAL_COMMUNITY): Admission: RE | Admit: 2009-06-30 | Discharge: 2009-09-28 | Payer: Self-pay | Admitting: Nephrology

## 2009-07-01 ENCOUNTER — Ambulatory Visit (HOSPITAL_COMMUNITY): Admission: RE | Admit: 2009-07-01 | Discharge: 2009-07-01 | Payer: Self-pay | Admitting: Nephrology

## 2009-07-23 ENCOUNTER — Ambulatory Visit: Payer: Self-pay | Admitting: Vascular Surgery

## 2009-07-31 ENCOUNTER — Ambulatory Visit: Payer: Self-pay | Admitting: Vascular Surgery

## 2009-07-31 ENCOUNTER — Ambulatory Visit (HOSPITAL_COMMUNITY): Admission: RE | Admit: 2009-07-31 | Discharge: 2009-07-31 | Payer: Self-pay | Admitting: Vascular Surgery

## 2009-08-19 ENCOUNTER — Emergency Department (HOSPITAL_COMMUNITY): Admission: EM | Admit: 2009-08-19 | Discharge: 2009-08-19 | Payer: Self-pay | Admitting: Emergency Medicine

## 2009-09-08 ENCOUNTER — Ambulatory Visit: Payer: Self-pay | Admitting: Vascular Surgery

## 2009-10-20 ENCOUNTER — Encounter (HOSPITAL_COMMUNITY): Admission: RE | Admit: 2009-10-20 | Discharge: 2010-01-18 | Payer: Self-pay | Admitting: Nephrology

## 2009-11-25 ENCOUNTER — Ambulatory Visit: Payer: Self-pay | Admitting: Vascular Surgery

## 2009-11-26 ENCOUNTER — Ambulatory Visit (HOSPITAL_COMMUNITY): Admission: RE | Admit: 2009-11-26 | Discharge: 2009-11-26 | Payer: Self-pay | Admitting: Vascular Surgery

## 2009-11-26 ENCOUNTER — Ambulatory Visit: Payer: Self-pay | Admitting: Vascular Surgery

## 2009-12-04 ENCOUNTER — Ambulatory Visit (HOSPITAL_COMMUNITY): Admission: RE | Admit: 2009-12-04 | Discharge: 2009-12-04 | Payer: Self-pay | Admitting: Vascular Surgery

## 2010-01-13 ENCOUNTER — Ambulatory Visit (HOSPITAL_COMMUNITY): Admission: RE | Admit: 2010-01-13 | Discharge: 2010-01-13 | Payer: Self-pay | Admitting: Nephrology

## 2010-01-14 ENCOUNTER — Ambulatory Visit: Payer: Self-pay | Admitting: Vascular Surgery

## 2010-01-26 ENCOUNTER — Ambulatory Visit: Payer: Self-pay | Admitting: Vascular Surgery

## 2010-01-26 ENCOUNTER — Ambulatory Visit (HOSPITAL_COMMUNITY)
Admission: RE | Admit: 2010-01-26 | Discharge: 2010-01-26 | Payer: Self-pay | Source: Home / Self Care | Admitting: Vascular Surgery

## 2010-02-11 ENCOUNTER — Ambulatory Visit: Payer: Self-pay | Admitting: Vascular Surgery

## 2010-04-08 ENCOUNTER — Ambulatory Visit (HOSPITAL_COMMUNITY): Admission: RE | Admit: 2010-04-08 | Discharge: 2010-04-08 | Payer: Self-pay | Admitting: Vascular Surgery

## 2010-04-29 ENCOUNTER — Ambulatory Visit (HOSPITAL_COMMUNITY)
Admission: RE | Admit: 2010-04-29 | Discharge: 2010-04-29 | Payer: Self-pay | Source: Home / Self Care | Admitting: Nephrology

## 2010-04-29 ENCOUNTER — Emergency Department (HOSPITAL_COMMUNITY)
Admission: EM | Admit: 2010-04-29 | Discharge: 2010-04-30 | Payer: Self-pay | Source: Home / Self Care | Admitting: Emergency Medicine

## 2010-05-11 ENCOUNTER — Ambulatory Visit: Payer: Self-pay | Admitting: Vascular Surgery

## 2010-05-18 ENCOUNTER — Ambulatory Visit (HOSPITAL_COMMUNITY)
Admission: RE | Admit: 2010-05-18 | Discharge: 2010-05-18 | Payer: Self-pay | Source: Home / Self Care | Attending: Vascular Surgery | Admitting: Vascular Surgery

## 2010-05-18 HISTORY — PX: AV FISTULA PLACEMENT: SHX1204

## 2010-06-17 ENCOUNTER — Ambulatory Visit: Admission: RE | Admit: 2010-06-17 | Discharge: 2010-06-17 | Payer: Self-pay | Source: Home / Self Care

## 2010-06-18 NOTE — Assessment & Plan Note (Signed)
OFFICE VISIT  Froning, Karisha S DOB:  [birth year]                                       06/17/2010 ZOXWR#:[id number]  HISTORY OF PRESENT ILLNESS:  The patient returns today for followup after placement of a right brachiocephalic AV fistula on 05/18/2010. She denies any symptoms of numbness, tingling or aching in her right hand.  She has had no fever or chills and states that the wound has healed.  PHYSICAL EXAM:  Blood pressure is 171/69 in the left arm, heart rate is 96 and regular.  Temperature is 98.2.  She has an easily palpable thrill over the upper arm fistula.  The vein is easily palpable throughout its entire course, has an audible bruit.  She currently is dialyzing Monday, Wednesday and Friday through a dialysis catheter.  Hopefully the fistula will be ready for use in March.  [name] continuing to exercise it.  She will follow up with me on an as-needed basis.    Janetta Hora. Fields, MD Electronically Signed  CEF/MEDQ  D:  06/17/2010  T:  06/18/2010  Job:  [id number]  cc:   [facility] Dyke Maes, M.D.

## 2010-06-19 ENCOUNTER — Encounter: Payer: Self-pay | Admitting: Nephrology

## 2010-06-20 ENCOUNTER — Encounter: Payer: Self-pay | Admitting: Nephrology

## 2010-06-27 LAB — CONVERTED CEMR LAB
Albumin: 2.7 g/dL — ABNORMAL LOW (ref 3.5–5.2)
BUN: 52 mg/dL — ABNORMAL HIGH (ref 6–23)
Blood Glucose, Fingerstick: 194
CO2: 28 meq/L (ref 19–32)
Calcium: 8.8 mg/dL (ref 8.4–10.5)
Chloride: 102 meq/L (ref 96–112)
Cholesterol: 169 mg/dL
Creatinine, Ser: 1.95 mg/dL — ABNORMAL HIGH (ref 0.40–1.20)
Glucose, Bld: 219 mg/dL — ABNORMAL HIGH (ref 70–99)
HDL: 38 mg/dL
Hemoglobin: 11.9 g/dL
Hgb A1c MFr Bld: 7.9 %
Hgb A1c MFr Bld: 9.1 %
LDL Cholesterol: 71 mg/dL
Phosphorus: 5.2 mg/dL — ABNORMAL HIGH (ref 2.3–4.6)
Potassium: 4.4 meq/L (ref 3.5–5.3)
RBC count: 4.6 10*6/uL
Sodium: 136 meq/L (ref 135–145)
Total CHOL/HDL Ratio: 4.4
Triglycerides: 298 mg/dL
VLDL: 60 mg/dL
WBC, blood: 4.6 10*3/uL

## 2010-08-09 LAB — BASIC METABOLIC PANEL
BUN: 44 mg/dL — ABNORMAL HIGH (ref 6–23)
Chloride: 94 mEq/L — ABNORMAL LOW (ref 96–112)
GFR calc non Af Amer: 6 mL/min — ABNORMAL LOW (ref >60)
Potassium: 3.6 mEq/L (ref 3.5–5.1)
Sodium: 138 mEq/L (ref 135–145)

## 2010-08-09 LAB — DIFFERENTIAL
Eosinophils Relative: 1 % (ref 0–5)
Lymphocytes Relative: 14 % (ref 12–46)
Lymphs Abs: 1.1 10*3/uL (ref 0.7–4.0)
Monocytes Absolute: 0.4 10*3/uL (ref 0.1–1.0)
Monocytes Relative: 5 % (ref 3–12)
Neutro Abs: 6.5 10*3/uL (ref 1.7–7.7)

## 2010-08-09 LAB — GLUCOSE, CAPILLARY

## 2010-08-09 LAB — POCT I-STAT 4, (NA,K, GLUC, HGB,HCT)
Potassium: 4.3 mEq/L (ref 3.5–5.1)
Sodium: 137 mEq/L (ref 135–145)

## 2010-08-09 LAB — SURGICAL PCR SCREEN: Staphylococcus aureus: NEGATIVE

## 2010-08-09 LAB — CBC
HCT: 35.9 % — ABNORMAL LOW (ref 36.0–46.0)
Hemoglobin: 11.4 g/dL — ABNORMAL LOW (ref 12.0–15.0)
MCV: 82 fL (ref 78.0–100.0)
RBC: 4.38 MIL/uL (ref 3.87–5.11)
RDW: 18.7 % — ABNORMAL HIGH (ref 11.5–15.5)
WBC: 8.2 10*3/uL (ref 4.0–10.5)

## 2010-08-13 LAB — POCT I-STAT 4, (NA,K, GLUC, HGB,HCT)
Glucose, Bld: 169 mg/dL — ABNORMAL HIGH (ref 70–99)
HCT: 44 % (ref 36.0–46.0)
Hemoglobin: 15 g/dL (ref 12.0–15.0)

## 2010-08-13 LAB — GLUCOSE, CAPILLARY: Glucose-Capillary: 199 mg/dL — ABNORMAL HIGH (ref 70–99)

## 2010-08-13 LAB — SURGICAL PCR SCREEN
MRSA, PCR: NEGATIVE
Staphylococcus aureus: NEGATIVE

## 2010-08-15 LAB — GLUCOSE, CAPILLARY: Glucose-Capillary: 135 mg/dL — ABNORMAL HIGH (ref 70–99)

## 2010-08-15 LAB — SURGICAL PCR SCREEN: Staphylococcus aureus: NEGATIVE

## 2010-08-15 LAB — FERRITIN
Ferritin: 350 ng/mL — ABNORMAL HIGH (ref 10–291)
Ferritin: 596 ng/mL — ABNORMAL HIGH (ref 10–291)

## 2010-08-15 LAB — IRON AND TIBC
Iron: 76 ug/dL (ref 42–135)
TIBC: 231 ug/dL — ABNORMAL LOW (ref 250–470)
TIBC: 229 ug/dL — ABNORMAL LOW (ref 250–470)
UIBC: 153 ug/dL

## 2010-08-15 LAB — POCT I-STAT 4, (NA,K, GLUC, HGB,HCT)
HCT: 34 % — ABNORMAL LOW (ref 36.0–46.0)
Hemoglobin: 11.6 g/dL — ABNORMAL LOW (ref 12.0–15.0)
Potassium: 4.1 mEq/L (ref 3.5–5.1)
Sodium: 142 mEq/L (ref 135–145)

## 2010-08-16 LAB — BASIC METABOLIC PANEL
CO2: 25 mEq/L (ref 19–32)
Chloride: 102 mEq/L (ref 96–112)
GFR calc Af Amer: 7 mL/min — ABNORMAL LOW (ref >60)
Glucose, Bld: 74 mg/dL (ref 70–99)
Sodium: 137 mEq/L (ref 135–145)

## 2010-08-17 LAB — POCT HEMOGLOBIN-HEMACUE: Hemoglobin: 9.7 g/dL — ABNORMAL LOW (ref 12.0–15.0)

## 2010-08-22 LAB — URINALYSIS, ROUTINE W REFLEX MICROSCOPIC
Glucose, UA: 100 mg/dL — AB
Leukocytes, UA: NEGATIVE
Nitrite: NEGATIVE
Specific Gravity, Urine: 1.02 (ref 1.005–1.030)
pH: 7 (ref 5.0–8.0)

## 2010-08-22 LAB — POCT I-STAT 4, (NA,K, GLUC, HGB,HCT)
Glucose, Bld: 208 mg/dL — ABNORMAL HIGH (ref 70–99)
HCT: 36 % (ref 36.0–46.0)
Hemoglobin: 12.2 g/dL (ref 12.0–15.0)
Potassium: 3.8 mEq/L (ref 3.5–5.1)
Sodium: 140 mEq/L (ref 135–145)

## 2010-08-22 LAB — DIFFERENTIAL
Basophils Absolute: 0 10*3/uL (ref 0.0–0.1)
Lymphocytes Relative: 35 % (ref 12–46)
Monocytes Absolute: 0.6 10*3/uL (ref 0.1–1.0)
Neutro Abs: 3.5 10*3/uL (ref 1.7–7.7)

## 2010-08-22 LAB — LIPASE, BLOOD: Lipase: 54 U/L (ref 11–59)

## 2010-08-22 LAB — URINE MICROSCOPIC-ADD ON

## 2010-08-22 LAB — POCT CARDIAC MARKERS
CKMB, poc: 4.3 ng/mL (ref 1.0–8.0)
Myoglobin, poc: >500 ng/mL (ref 12–200)

## 2010-08-22 LAB — CBC
MCHC: 33 g/dL (ref 30.0–36.0)
MCV: 71.9 fL — ABNORMAL LOW (ref 78.0–100.0)
Platelets: 203 10*3/uL (ref 150–400)
RDW: 18 % — ABNORMAL HIGH (ref 11.5–15.5)

## 2010-08-22 LAB — COMPREHENSIVE METABOLIC PANEL
Albumin: 2.6 g/dL — ABNORMAL LOW (ref 3.5–5.2)
BUN: 66 mg/dL — ABNORMAL HIGH (ref 6–23)
Chloride: 102 mEq/L (ref 96–112)
Creatinine, Ser: 5.43 mg/dL — ABNORMAL HIGH (ref 0.4–1.2)
GFR calc non Af Amer: 8 mL/min — ABNORMAL LOW (ref >60)
Total Bilirubin: 0.4 mg/dL (ref 0.3–1.2)

## 2010-08-22 LAB — FERRITIN: Ferritin: 450 ng/mL — ABNORMAL HIGH (ref 10–291)

## 2010-08-22 LAB — POCT HEMOGLOBIN-HEMACUE: Hemoglobin: 9.2 g/dL — ABNORMAL LOW (ref 12.0–15.0)

## 2010-08-31 LAB — POCT HEMOGLOBIN-HEMACUE: Hemoglobin: 11.3 g/dL — ABNORMAL LOW (ref 12.0–15.0)

## 2010-09-01 LAB — POCT HEMOGLOBIN-HEMACUE
Hemoglobin: 11.2 g/dL — ABNORMAL LOW (ref 12.0–15.0)
Hemoglobin: 10.7 g/dL — ABNORMAL LOW (ref 12.0–15.0)

## 2010-09-02 LAB — POCT HEMOGLOBIN-HEMACUE: Hemoglobin: 10.3 g/dL — ABNORMAL LOW (ref 12.0–15.0)

## 2010-09-04 LAB — GLUCOSE, CAPILLARY: Glucose-Capillary: 187 mg/dL — ABNORMAL HIGH (ref 70–99)

## 2010-09-04 LAB — URINE MICROSCOPIC-ADD ON

## 2010-09-04 LAB — URINALYSIS, ROUTINE W REFLEX MICROSCOPIC
Glucose, UA: NEGATIVE mg/dL
Ketones, ur: NEGATIVE mg/dL
Specific Gravity, Urine: 1.025 (ref 1.005–1.030)
pH: 5.5 (ref 5.0–8.0)

## 2010-09-04 LAB — BASIC METABOLIC PANEL
BUN: 100 mg/dL — ABNORMAL HIGH (ref 6–23)
Calcium: 8.1 mg/dL — ABNORMAL LOW (ref 8.4–10.5)
GFR calc non Af Amer: 10 mL/min — ABNORMAL LOW (ref >60)
Potassium: 3.6 mEq/L (ref 3.5–5.1)
Sodium: 133 mEq/L — ABNORMAL LOW (ref 135–145)

## 2010-09-04 LAB — POCT CARDIAC MARKERS
Myoglobin, poc: >500 ng/mL (ref 12–200)
Myoglobin, poc: >500 ng/mL (ref 12–200)
Troponin i, poc: <0.05 ng/mL (ref 0.00–0.09)

## 2010-09-04 LAB — DIFFERENTIAL
Eosinophils Relative: 1 % (ref 0–5)
Lymphocytes Relative: 25 % (ref 12–46)
Lymphs Abs: 1.5 10*3/uL (ref 0.7–4.0)
Neutro Abs: 3.9 10*3/uL (ref 1.7–7.7)

## 2010-09-04 LAB — URINE CULTURE

## 2010-09-04 LAB — CBC
HCT: 32 % — ABNORMAL LOW (ref 36.0–46.0)
Platelets: 166 10*3/uL (ref 150–400)
WBC: 5.9 10*3/uL (ref 4.0–10.5)

## 2010-09-16 ENCOUNTER — Encounter (INDEPENDENT_AMBULATORY_CARE_PROVIDER_SITE_OTHER): Payer: Medicare Other

## 2010-09-16 ENCOUNTER — Ambulatory Visit (INDEPENDENT_AMBULATORY_CARE_PROVIDER_SITE_OTHER): Payer: Medicare Other

## 2010-09-16 DIAGNOSIS — T82898A Other specified complication of vascular prosthetic devices, implants and grafts, initial encounter: Secondary | ICD-10-CM

## 2010-09-16 DIAGNOSIS — N186 End stage renal disease: Secondary | ICD-10-CM

## 2010-09-16 NOTE — H&P (Signed)
HISTORY AND PHYSICAL EXAMINATION  September 16, 2010  Re:  [name]               DOB:  April 27, 1946  HISTORY OF PRESENT ILLNESS:  This is a 65 year old woman who had a right brachiocephalic fistula placed on May 26, 2010.  They had begun to dialyze her through this fistula and were having trouble cannulating the area and she developed a large hematoma in the midportion of the AV fistula.  She was sent for evaluation of the fistula for possible superficialization.  PAST MEDICAL HISTORY:  Significant for diabetes, high blood pressure, end-stage renal disease, hypothyroidism, hypercholesterolemia.  SOCIAL HISTORY:  [name] married and has 2 children.  She smokes a half- pack of cigarettes per day.  She does not consume alcohol regularly.  PHYSICAL EXAM:  General:  This is a well-developed, well-nourished woman in no acute distress.  HEENT:  Grossly normal limits.  Her lungs are clear without wheezes, rales, or rhonchi.  Heart:  Rate and rhythm is regular without murmur or rub.  Bilateral upper extremities:  She has good and equal strength.  She has palpable radial pulses.  She has a well-healed scar in the right wrist as well as the right antecubital fossa.  She has a good thrill and bruit in the right upper arm brachiocephalic fistula.  She has a small hematoma lateral to the midportion of her AV fistula.  Vital signs:  Her heart rate was 92, her saturations were 94%, and her respiratory rate was 12.  Skin had no ulcers or rashes.  Musculoskeletal:  No major deformities or cyanosis.  VASCULAR LAB:  Fistula evaluation showed that the distal portion of the fistula was 1.4 cm deep and the upper portion of the fistula also shows she had thrombosed pseudoaneurysm.  ASSESSMENT/PLAN:  Functioning right upper arm arteriovenous fistula in this patient who has an indwelling tunneled catheter.  This has been difficult to cannulate especially in the upper portion or  more distal portion of the fistula which proved by ultrasound to be fairly deep in the arm.  The plan is to do a superficialization and revision of the brachial cephalic fistula on May 1st by Dr. Darrick Penna.  Della Goo, PA-C  Charles E. Fields, MD Electronically Signed  RR/MEDQ  D:  09/16/2010  T:  09/16/2010  Job:  [id number]

## 2010-09-23 NOTE — Procedures (Unsigned)
VASCULAR LAB EXAM  INDICATION:  Nonmaturing right AV fistula.  HISTORY: Diabetes: Cardiac: Hypertension:  EXAM:  Duplex of right brachiocephalic fistula.  IMPRESSION:  Patent fistula with what appears to be a thrombosed pseudoaneurysm observed in the mid upper arm with thrombus extending slightly into the vein causing elevated velocities of >700 cm/s.  Just proximal to this, there is a patent branch measuring 0.28 cm. Incidentally, the brachial arteries are duplicated in the arm.  The artery that becomes the ulnar is within normal limits.  The artery that becomes the radial artery which feeds the fistula is to/fro in the forearm.  ___________________________________________ Janetta Hora Fields, MD  LT/MEDQ  D:  09/16/2010  T:  09/16/2010  Job:  [id number]

## 2010-09-28 ENCOUNTER — Ambulatory Visit (HOSPITAL_COMMUNITY)
Admission: RE | Admit: 2010-09-28 | Discharge: 2010-09-28 | Disposition: A | Discharge status: Home / Self Care | Payer: Medicare Other | Source: Ambulatory Visit | Attending: Vascular Surgery | Admitting: Vascular Surgery

## 2010-09-28 DIAGNOSIS — Z01812 Encounter for preprocedural laboratory examination: Secondary | ICD-10-CM | POA: Insufficient documentation

## 2010-09-28 DIAGNOSIS — F17200 Nicotine dependence, unspecified, uncomplicated: Secondary | ICD-10-CM | POA: Insufficient documentation

## 2010-09-28 DIAGNOSIS — I721 Aneurysm of artery of upper extremity: Secondary | ICD-10-CM | POA: Insufficient documentation

## 2010-09-28 DIAGNOSIS — N186 End stage renal disease: Secondary | ICD-10-CM | POA: Insufficient documentation

## 2010-09-28 DIAGNOSIS — Z992 Dependence on renal dialysis: Secondary | ICD-10-CM | POA: Insufficient documentation

## 2010-09-28 DIAGNOSIS — T82898A Other specified complication of vascular prosthetic devices, implants and grafts, initial encounter: Secondary | ICD-10-CM

## 2010-09-28 DIAGNOSIS — T82598A Other mechanical complication of other cardiac and vascular devices and implants, initial encounter: Secondary | ICD-10-CM | POA: Insufficient documentation

## 2010-09-28 DIAGNOSIS — Y832 Surgical operation with anastomosis, bypass or graft as the cause of abnormal reaction of the patient, or of later complication, without mention of misadventure at the time of the procedure: Secondary | ICD-10-CM | POA: Insufficient documentation

## 2010-09-28 DIAGNOSIS — Z531 Procedure and treatment not carried out because of patient's decision for reasons of belief and group pressure: Secondary | ICD-10-CM | POA: Insufficient documentation

## 2010-09-28 DIAGNOSIS — I120 Hypertensive chronic kidney disease with stage 5 chronic kidney disease or end stage renal disease: Secondary | ICD-10-CM

## 2010-09-28 DIAGNOSIS — E119 Type 2 diabetes mellitus without complications: Secondary | ICD-10-CM | POA: Insufficient documentation

## 2010-09-28 LAB — SURGICAL PCR SCREEN
MRSA, PCR: NEGATIVE
Staphylococcus aureus: NEGATIVE

## 2010-09-28 LAB — POCT I-STAT 4, (NA,K, GLUC, HGB,HCT)
Hemoglobin: 12.6 g/dL (ref 12.0–15.0)
Sodium: 137 mEq/L (ref 135–145)

## 2010-09-28 LAB — GLUCOSE, CAPILLARY
Glucose-Capillary: 178 mg/dL — ABNORMAL HIGH (ref 70–99)
Glucose-Capillary: 154 mg/dL — ABNORMAL HIGH (ref 70–99)

## 2010-10-04 NOTE — Op Note (Signed)
NAMEDANARIA, LARSEN               ACCOUNT NO.:  [id number]  MEDICAL RECORD NO.:  [id number]           PATIENT TYPE:  O  LOCATION:  [facility]                         FACILITY:  [facility]  PHYSICIAN:  Ramadan Couey E. Jocob Dambach, MD  DATE OF BIRTH:  Mar 29, 1946  DATE OF PROCEDURE:  09/28/2010 DATE OF DISCHARGE:  09/28/2010                              OPERATIVE REPORT   PROCEDURE: 1. Superficialization on right brachiocephalic AV fistula. 2. Patch angioplasty of midsection of right brachiocephalic AV     fistula.  PREOPERATIVE DIAGNOSES: 1. Nonmaturing atrioventricular fistula, right brachiocephalic. 2. Pseudoaneurysm, right brachiocephalic atrioventricular fistula.  SURGEON:  Janetta Hora. Devonne Lalani, MD  ASSISTANT:  Della Goo, PA-C  ANESTHESIA:  General.  OPERATIVE FINDINGS: 1. Greater than 90% stenosis mid segment right brachiocephalic AV     fistula, repaired with bovine pericardial patch angioplasty. 2. Fairly uniform right brachiocephalic AV fistula excepting up to a 5-     mm dilator.  OPERATIVE DETAILS:  After obtaining informed consent, the patient was taken to the operating room.  The patient was placed in the supine position on the operating table.  After induction of general anesthesia, the patient's entire right upper extremity was prepped and draped in usual sterile fashion.  A longitudinal incision was made approximately 3 cm above the antecubital crease.  Incision was carried down through the subcutaneous tissues down the level of the preexisting right brachiocephalic AV fistula.  This has a palpable thrill within it. Fistula was dissected free circumferentially.  There were multiple side branches along the course of the fistula and these were all ligated and divided between silk ties.  The vein was of good quality.  The vein was circumferentially mobilized almost down to the level of the anastomosis. I then proceeded to mobilize this more proximally.  Preoperative  duplex imaging had shown a pseudoaneurysm in the midportion of the fistula and there were dense adhesions in this area.  Therefore, the incision was extended longitudinally more towards the shoulder to fully expose this area.  All the adhesions were taken down and there was a small pseudoaneurysm cavity which had been thrombosed.  This had also caused near total occlusion of the fistula.  However, there was some flow distally.  I then proceeded to mobilize the entire fistula through the lower portion of the incision which was approximately 8 cm in length.  An additional skip incision was then made just above this to fully mobilize the cephalic vein all the way to the level of the shoulder.  In the upper arm incision, the subcutaneous tissues were debrided away to raise skin flaps so that there was only a thin covering of skin over the upper portion of the fistula and this was closed with a running 4-0 Vicryl subcuticular stitch.  One 2-0 Vicryl stitch was placed posterior to the fistula for elevate this up to the level of the skin.  Attention was then turned to the midportion of the fistula.  The patient was given 5000 units of intravenous heparin.  Small bulldog clamps were used to control the fistula proximally and distally just above and below the level  of the pseudoaneurysm.  After 2 minutes of circulation time, the fistula was occluded and a longitudinal opening made in the fistula. There was a high-grade greater than 90% stenosis of the section of the fistula.  The venotomy was extended proximally and distally to a more normal segment of vein.  The segment of vein was probed with serial dilators and found to accept up to a 5-mm dilator.  A bovine pericardial patch was then brought up in the operative field and sewn on as a patch angioplasty.  The patch was approximately 4 cm in length.  This was done with running 6-0 Prolene suture.  Just prior to completion of anastomosis, this  was forebled, backbled, and thoroughly flushed. Anastomosis was secured, clamps were released, there was a palpable thrill in the fistula immediately.  Hemostasis was obtained with the assistance of 50 mg of protamine.  Again, more subcutaneous tissues were debrided away from the skin creating skin flaps that were fairly thin. Two 3-0 Vicryl tacking sutures were placed over the area of the patch angioplasty and then the entire skin was then closed with a running 4-0 Vicryl subcuticular stitch.  Dermabond was applied to all incisions. The patient tolerated the procedure well, and there were no complications.  The patient had a palpable thrill in the fistula at the end of the case.  The patient was taken to the recovery room in stable condition.     Janetta Hora. Modell Fendrick, MD     CEF/MEDQ  D:  09/28/2010  T:  09/29/2010  Job:  [id number]  Electronically Signed by Fabienne Bruns MD on 10/04/2010 07:58:53 AM

## 2010-10-05 ENCOUNTER — Other Ambulatory Visit (HOSPITAL_COMMUNITY): Payer: Self-pay | Admitting: Internal Medicine

## 2010-10-05 DIAGNOSIS — Z139 Encounter for screening, unspecified: Secondary | ICD-10-CM

## 2010-10-07 ENCOUNTER — Ambulatory Visit (HOSPITAL_COMMUNITY)
Admission: RE | Admit: 2010-10-07 | Discharge: 2010-10-07 | Disposition: A | Discharge status: Home / Self Care | Payer: Medicare Other | Source: Ambulatory Visit | Attending: Internal Medicine | Admitting: Internal Medicine

## 2010-10-07 DIAGNOSIS — Z1231 Encounter for screening mammogram for malignant neoplasm of breast: Secondary | ICD-10-CM | POA: Insufficient documentation

## 2010-10-07 DIAGNOSIS — Z139 Encounter for screening, unspecified: Secondary | ICD-10-CM

## 2010-10-12 NOTE — Assessment & Plan Note (Signed)
[facility]                       [facility] OFFICE NOTE   NAME:[name], [name]                      MRN:          [id number]  DATE:05/09/2007                            DOB:          January 28, 1946    [name] was seen today in follow-up.  [name] new to me.  She was  hospitalized from November 15-18 and seen by Dr. Jens Som.  The patient  was admitted for orthostasis and dehydration.  She has previously been  seen by Dr. Dorethea Clan back in 2007.  He saw her for primarily  hyperlipidemia, atypical chest pain and hypertension.  She has had two  nonischemic Myoviews.  The most recent was in January 2007 for atypical  chest pain.   The patient was on Demadex and Avapro when she was admitted.  She has  mild chronic renal insufficiency and is followed by Dr. Briant Cedar.   When she was admitted in November her BUN was 66 and her creatinine was  2.3.  On discharge her creatinine was 0.5 and her BUN was still  elevated.   She ruled out for myocardial infarction.  Her BNP was 252.  Her sugars  were not well-controlled with a hemoglobin A1c of 10.8.   I believe her primary care doctor is Dr. Despina Hidden, and she needs much better  control of her sugars.  She still has not been monitoring them regularly  at home.  We had a long discussion regarding this and the importance of  it.  I suspect that part of her dehydration was osmotic due to her  hyperglycemia.   The patient was discharged on half the normal dose of Demadex and  Avapro.  She feels stronger.  There has been no chest pain, PND or  orthopnea, no palpitations or presyncope.   It took me quite some time to read through her hospital notes and chart  as I had never seen her before.  Her review of systems is otherwise  negative.   PAST MEDICAL HISTORY:  1. Hyperlipidemia.  2. Diabetes.  3. Multinodular goiter.  4. Hypertension.  5. She had a carotid Doppler in January 2007, which showed no  significant stenosis.  Her echo in January 2007 showed an EF of 60-      65% with mild LVH and trace TR.   CURRENT MEDICATIONS:  1. Lipitor 80 mg a day.  2. An aspirin a day.  3. Lantus insulin 20 mg at night.  4. Avapro 150 mg a day.  5. Doxazosin 8 mg two tablets q.h.s.  6. Synthroid 75 mcg a day.  7. Glipizide 10 mg b.i.d.  8. Plavix 75 mg a day.  9. Furosemide 10 mg a day.  10.Iron.   EXAM:  Remarkable for an elderly black female in no distress.  Affect is  appropriate.  Blood pressure 140/70, pulse 72 and regular, weight 160, which is 10  pounds higher than November.  Respiratory rate 14.  Afebrile.  HEENT:  Unremarkable.  She has a left carotid bruit.  NECK:  Supple.  No lymphadenopathy, thyromegaly or JVP elevation.  LUNGS:  Clear with  good diaphragmatic motion, no wheezing.  There is an S1, S2, with normal heart sounds.  PMI is normal.  ABDOMEN:  Benign.  Bowel sounds positive.  No renal bruits.  No AAA.  No  tenderness.  No hepatosplenomegaly or hepatojugular reflux.  Distal pulses are intact.  No edema.  NEUROLOGIC:  Nonfocal.  No muscular weakness.  SKIN:  Warm and dry.   Baseline EKG shows sinus rhythm with voltage criteria for LVH.   IMPRESSION:  1. Hypertension in the setting of mild renal insufficiency.  Low-      protein, low-salt diet.  Follow up with Dr. Briant Cedar to check      BMET.  She has an appointment the first week in January.  Check      renal duplex to rule out renal artery stenosis.  2. Recent hospitalization for dehydration in the setting of      hyperglycemia.  She needs much better sugar control.  She      understands the osmotic properties of hyperglycemia now.  Her      furosemide and Avapro were cut back.  She will follow up with the      primary care MD for following up her sugars.  3. History of hypertension, currently well-controlled.  Continue      current dosages of medication with only mild left ventricular      hypertrophy on  echo.  4. Hyperlipidemia.  Continue Lipitor 80 mg a day.  Lipid and liver      profile in 6 months.  5. History of multinodular goiter with thyroid replacement.  TSH, T4      in 6 months.  6. Left carotid bruit.  Follow up carotid duplex.  Since she is having      a renal duplex tomorrow in [place], we will add this on to rule      out significant carotid disease.     Noralyn Pick. Eden Emms, MD, [facility]  Electronically Signed    PCN/MedQ  DD: 05/09/2007  DT: 05/10/2007  Job #: [id number]

## 2010-10-12 NOTE — Assessment & Plan Note (Signed)
OFFICE VISIT   Spinner, [name]  DOB:  [birth year]                                       01/14/2010  XLKGM#:[id number]   The patient returns for followup today.  She had a left brachiocephalic  AV fistula placed by Dr. Hart Rochester in March of 2011.  She has failed to  mature.  She recently had a fistulogram which showed at least one large  side branch at the proximal aspect of the fistula.  [name] currently  dialyzing via catheter.  She denies any steal symptoms in the arm.   PHYSICAL EXAM:  Today blood pressure is 180/64 in the right arm, heart  rate is 76 and regular.  Temperature is 98.1.  Left brachiocephalic AV  fistula is fairly deep in character overall.  There is at least one side  branch palpable in the proximal third.  There is an easily palpable  thrill and audible bruit throughout the fistula.   REVIEW OF SYSTEMS:  She denies any shortness of breath or chest pain.  Overall she seems to be tolerating dialysis fairly well.   In summary, I believe the best option for the patient at this point  would be to ligate any side branches in her left arm AV fistula.  I  believe also she would benefit from superficializing the fistula to make  it an easier target to stick from the skin if it does look mature  throughout its course at the time of expiration.  We have scheduled this  procedure for her on 01/26/2010 as she had a previous commitment next  Tuesday.     Janetta Hora. Fields, MD  Electronically Signed   CEF/MEDQ  D:  01/14/2010  T:  01/15/2010  Job:  [id number]   cc:   Dyke Maes, M.D.

## 2010-10-12 NOTE — Procedures (Signed)
CEPHALIC VEIN MAPPING   INDICATION:  Evidence for an AV fistula.   HISTORY:  Renal failure.   EXAM:  The right cephalic vein was compressible.   Diameter measurements ranged from 0.13 to 0.32 cm.   The basilic vein is compressible with diameter measurements ranging from  0.35 to 0.48 cm in the upper arm.   The left cephalic vein is compressible.   Diameter measurements ranged from 0.23 to 0.32 cm.   The basilic vein is compressible with diameter measurements from 0.22 to  0.58 cm in the upper arm.   See attached worksheet for all measurements.   IMPRESSION:  The patient's bilateral basilic and cephalic vein which are  of questionable diameter for use as dialysis access site.   ___________________________________________  Di Kindle. Edilia Bo, M.D.   CJ/MEDQ  D:  07/23/2009  T:  07/23/2009  Job:  [id number]

## 2010-10-12 NOTE — Procedures (Signed)
NAMEDAVINE, SWENEY               ACCOUNT NO.:  [id number]   MEDICAL RECORD NO.:  [id number]          PATIENT TYPE:  OUT   LOCATION:  RAD                           FACILITY:  [facility]   PHYSICIAN:  Gerrit Friends. Dietrich Pates, MD, FACCDATE OF BIRTH:  [birth year]   DATE OF PROCEDURE:  04/09/2007  DATE OF DISCHARGE:                                ECHOCARDIOGRAM   REFERRING:  Erle Crocker, MD, and Noralyn Pick. Eden Emms, MD   CLINICAL DATA:  A 65 year old woman with diabetes, hypertension, EKG  abnormalities and chest pain.   M-mode:  Aorta 2.4, left atrium 3.8, septum 1.4, posterior wall 1.3, LV  diastole 4.4, LV systole 3.6.   1. Technically adequate echocardiographic study.  2. Left atrial size at the upper limit of normal; normal right atrium.  3. Normal right ventricular size and function; borderline RVH.  4. Trileaflet aortic valve with minimal sclerosis and normal mobility.  5. Borderline mitral valve thickening; normal tricuspid and pulmonic      valve; normal proximal pulmonary artery.  6. Normal left ventricular size; mild hypertrophy; normal regional and      global function.  7. Normal IVC.  8. Comparison with prior study of June 24, 2005:  No significant      interval change.      Gerrit Friends. Dietrich Pates, MD, [facility]  Electronically Signed     RMR/MEDQ  D:  04/09/2007  T:  04/10/2007  Job:  [id number]

## 2010-10-12 NOTE — Assessment & Plan Note (Signed)
OFFICE VISIT   [name], Sheila ESPINOZA  DOB:  Feb 28, 1946                                       02/11/2010  ZDGLO#:[id number]   The patient returns for followup today.  She underwent  superficialization of her left brachiocephalic AV fistula with patch  angioplasty of the proximal anastomosis on 01/26/2010.  I also ligated  multiple side branches.  She returns today for further followup.   She denies any symptoms of steal in her left upper extremity.  She had  one area of an incision that was oozing some blood but this stopped  after a couple of days.   PHYSICAL EXAM:  Today blood pressure is 127/67 in the right arm, heart  rate is 89 and regular.  Temperature is 97.9.  She has an easily  palpable thrill and audible bruit in the left upper arm AV fistula.  The  incisions are healing well at this point.  The fistula is much easier to  palpate at this point as well.   The patient will follow up on an as-needed basis.  Her fistula should be  ready for cannulation in the next few weeks.  Hopefully this will be a  durable access for her.     Janetta Hora. Fields, MD  Electronically Signed   CEF/MEDQ  D:  02/11/2010  T:  02/12/2010  Job:  [id number]   cc:   Dyke Maes, M.D.

## 2010-10-12 NOTE — Assessment & Plan Note (Signed)
OFFICE VISIT   [name], [name]  DOB:  [birth year]                                       05/11/2010  ZOXWR#:[id number]   The patient is a 65 year old female with end-stage renal disease  currently on hemodialysis Monday, Wednesday, and Friday.  She had a left  upper arm AV fistula created by me at the end of March 2011.  This was  revised by Dr. Darrick Penna to bring it more superficially located in the arm  in August and the fistula had continued to have problems requiring  recent thrombolysis by the radiologist.  This revealed that the fistula  is non-revisable with very dense thickened sclerotic vein up to the  axilla.  He returns today for evaluation for vascular access.   CHRONIC MEDICAL PROBLEMS:  1. End-stage renal disease on hemodialysis.  2. Type 2 diabetes mellitus.  3. Hypertension.  4. Hypercholesterolemia.   FAMILY HISTORY:  No history of premature cardiovascular disease.   SOCIAL HISTORY:  [name] single, has 2 children, and smokes a half-pack  of cigarettes per day for 30-plus years.   REVIEW OF SYSTEMS:  Denies any chest pain, dyspnea on exertion, PND,  orthopnea.  Has had some weight gain.  No chronic cough, bronchitis.  Does have joint pain.  All other systems are negative review of systems.   PHYSICAL EXAMINATION:  Blood pressure 130/80, heart rate 84,  respirations 14, temperature 98.2.  Generally, a well-developed, well-  nourished female in no apparent distress, alert and oriented x3.  HEENT  exam is normal for age.  EOMs intact.  Lungs are clear to auscultation.  No rhonchi or wheezing.  Cardiovascular exam is regular rhythm, no  murmurs.  Carotid pulses are 3+.  There is a soft bruit on the left; no  bruit on the right.  Abdomen is obese.  No palpable masses.  Musculoskeletal exam is free of major deformities.  Lower extremity exam  reveals 3+ femoral pulses.  Neurologic exam is normal.  Skin is free of  rashes.  Upper extremity exam  reveals left arm to have an occluded AV  fistula in the left upper arm.  Right upper extremity exam reveals upper  arm cephalic and forearm cephalic vein to be of good caliber.   I have reviewed the previous vein mapping performed at our office on  February 2011 which reveals the right arm cephalic vein to be patent but  it does taper down near the wrist.  Today, I performed a bedside  SonoSite exam and it does appear that the forearm cephalic vein is of  good quality down to the last 6 cm.  I think she would be a good  candidate for right forearm AV fistula if this is not feasible at the  time surgery, then a right upper arm AV fistula.  Schedule that for  Tuesday, December 20th, by Dr. Darrick Penna at [facility] as an outpatient  and hopefully this will provide a satisfactory vascular access for this  nice lady.     [name] Rochester, M.D.  Electronically Signed   JDL/MEDQ  D:  05/11/2010  T:  05/12/2010  Job:  [id number]   cc:    [facility]

## 2010-10-12 NOTE — Assessment & Plan Note (Signed)
OFFICE VISIT   [name], Sheila GIANNOTTI  DOB:  Jun 24, 1945                                       09/08/2009  ZOXWR#:[id number]   This is a patient with end-stage renal disease not on hemodialysis who  had creation of left brachial artery to cephalic vein AV fistula  performed by me on March 4.  Fistula is functioning nicely with good  pulse and palpable thrill.  The vein was 3 to 3.5 mm in size at the time  of surgery.  She has a palpable radial pulse distally with no symptoms  or signs of distal steal.  Incision in the antecubital area has healed  nicely.   I think the fistula does have excellent flow and hopefully will be an  adequate site for access when and if she needs it for hemodialysis.  She  will return to see [place] on a p.r.n. basis.     Quita Skye Hart Rochester, M.D.  Electronically Signed   JDL/MEDQ  D:  09/08/2009  T:  09/09/2009  Job:  [id number]

## 2010-10-12 NOTE — H&P (Signed)
NAMEREMY, DIA               ACCOUNT NO.:  [id number]   MEDICAL RECORD NO.:  [id number]          PATIENT TYPE:  INP   LOCATION:  2033                         FACILITY:  [facility]   PHYSICIAN:  Satira Anis, MDDATE OF BIRTH:  Apr 15, 1946   DATE OF ADMISSION:  04/14/2007  DATE OF DISCHARGE:                              HISTORY & PHYSICAL   REFERRING PHYSICIAN:  Doug Sou, M.D.   HISTORY OF PRESENT ILLNESS:  The patient is a 65 year old female who  presented with generalized weakness, and she also has been having  episodes of nausea and neck pain and sweating.  The patient was  evaluated by the [facility] Cardiology team and an outpatient workup has  been scheduled for coronary artery disease.  However, in the interim she  presents to the ER with the above-mentioned complaints.  The patient  does not perceive any palpitations, near or frank syncope.  She has a  significant history of diabetes and chronic hypertension as well as  chronic renal insufficiency.  She does not offer any exertional chest  pain.  She does not have any orthopnea.  She had leg edema, which was  being treated with diuretics.  She does not complain of any calf  tenderness, though she has chronic leg cramps.   PAST MEDICAL HISTORY:  1. Chronic hypertension.  2. Diabetes.   PAST SURGICAL HISTORY:  1. Cholecystectomy.  2. Knee surgery.   SOCIAL HISTORY:  Nondrinker, no drug abuse.  Continues to smoke  cigarettes.  Lives with her relatives.   DRUG ALLERGIES:  NONE.   HOME MEDICATIONS:  1. Doxazosin 8 mg at bedtime.  2. Avapro 300 mg daily.  3. Synthroid 75 mcg every morning.  4. Aspirin 81 mg daily.  5. Glipizide 10 mg twice a day.  6. Torsemide 20 mg twice a day.  7. Lantus insulin 10 units at bedtime.   REVIEW OF SYSTEMS:  CONSTITUTIONAL:  Generalized weakness.  Negative for  weight loss or weight gain.  HEENT:  Negative for discharge.  CARDIOVASCULAR:  As mentioned in HPI.  RESPIRATORY:   Negative for cough  or hemoptysis.  GASTROINTESTINAL:  Negative for hematochezia or  hematemesis.  MUSCULOSKELETAL:  Leg cramps.  SKIN:  Negative for lumps  or bumps.  NEUROLOGICAL:  Negative.  PSYCHIATRIC:  Negative.  ENDOCRINE:  Diabetes.  No heat or cold intolerance.  All other systems were reviewed  and are negative.   FAMILY HISTORY:  Significant for CVA and CAD.   PHYSICAL EXAMINATION:  VITAL SIGNS:  Blood pressure is 140/85, pulse  rate is 77 and regular, respiratory rate is 12, temperature is 98.6.  HEAD/NECK:  Anicteric sclerae.  No pallor of the conjunctivae.  Mucous  membranes are moist.  JVP is normal.  No nuchal rigidity.  No spinal  tenderness in the area of the neck.  Carotid upstroke is brisk with  normal contour.  No bruit.  Thyroid is normal in size.  Trachea is  midline.  CHEST:  Symmetric equal air entry.  Normal breath sounds.  S1 and S2  regular.  No murmurs, no gallops.  No peripheral friction rub.  ABDOMEN:  Soft.  No abdominal bruit.  EXTREMITIES:  No edema.  Normal pulses.  No radio-femoral delay.  NEUROLOGICAL:  Nonfocal.  Motor is normal examination.  Grip strength is  normal in both upper extremities.  Sensory examination is grossly  normal.   LABORATORY STUDIES:  Electrocardiogram:  Normal ST-T segments.  Normal  sinus rhythm.  Essentially it is a normal EKG.   LABORATORY VALUES:  Sodium 135, potassium 4.1, BUN 66, creatinine 2.3.  WBCs 6300, hemoglobin 11.6, hematocrit 35.6%, platelet count is 187,000.  Troponin I is less than 0.05.  MB is 2.2.   ASSESSMENT AND PLAN:  This is a 65 year old African American female with  significant cardiovascular risk factors of diabetes mellitus, chronic  hypertension and chronic renal insufficiency presents with atypical  symptoms.  These may be considered anginal, equal; however, there is no  objective evidence of myocardial ischemia or injury by electrocardiogram  or enzymes.  We will obtain 3 sets of cardiac  enzymes and daily EKGs.  She was scheduled for an outpatient workup for coronary artery disease.  There is no clinical constellation of symptoms and signs to suggest any  other acute chest emergency.  This is likely acute pulmonary  thromboembolism or aortic dissection.  The patient is not in acute  cardiopulmonary distress.  She will be monitored in the telemetry unit.  All her home medications will be continued.  Further management as  decided by Dr. Jens Som and his colleagues.      Satira Anis, MD  Electronically Signed     RN/MEDQ  D:  04/14/2007  T:  04/14/2007  Job:  [id number]

## 2010-10-12 NOTE — Assessment & Plan Note (Signed)
OFFICE VISIT   [name], [name]  DOB:  1946/03/25                                       11/25/2009  SWNIO#:[id number]   The patient is a 65 year old female who recently had a left  brachiocephalic AV fistula created by Dr. Hart Rochester in March 2011.  She has  recently started dialysis.  They began to try to cannulate her fistula  yesterday but were unsuccessful.  She returns today for further  evaluation of her fistula.   She currently has no other dialysis access other than this fistula.   She states that they were able to get one needle in the proximal aspect  of the fistula, but they had difficulty placing the second needle.   CHRONIC MEDICAL PROBLEMS:  Diabetes, hypertension, elevated cholesterol.  These are all currently controlled and followed by [facility].   SOCIAL HISTORY:  [name] married and has 2 children.  Currently smokes a  half-pack of cigarettes per day.  She does not consume alcohol  regularly.   PHYSICAL EXAMINATION:  Blood pressure 162/73 in the right arm, heart  rate is 80 and regular, temperature is 97.9.  Left upper extremity:  She  has an easily palpable thrill, and the fistula is fairly deep and  difficult to palpate as you proceed up the arm.  She has a 1+ left  radial pulse.   I believe the best option for the patient at this point would be to get  a fistulogram to see whether or not there are any technical problems  with the fistula or side branches that might be fixable.  Otherwise we  may need to consider her for superficializing the fistula, as it is  fairly deep.  In the meanwhile, we will place a Diatek catheter in the  operating room tomorrow so that she will have temporary access.  She  will follow up with me after her fistulogram for further planning of her  left arm.     Janetta Hora. Fields, MD  Electronically Signed   CEF/MEDQ  D:  11/26/2009  T:  11/26/2009  Job:  [id number]   cc:   Zetta Bills, MD

## 2010-10-12 NOTE — Assessment & Plan Note (Signed)
HEALTHCARE                       [place] CARDIOLOGY OFFICE NOTE   NAME:[name], Sheila SLIGAR                      MRN:          [id number]  DATE:04/06/2007                            DOB:          January 03, 1946    [name] is a 65 year old patient previously seen by Dr. Dorethea Clan back  in February 2007.   The patient has coronary risk factors including hypertension,  hyperlipidemia, diabetes.  She had a nonischemic Myoview in June 24, 2005 with an EF of 63%.  At that time, she also had an echo, which  showed mild LVH with good EF, and no valvular disease.  Last week she  had some chest pain.  It was atypical.  It radiated to the neck.  It  actually sounded more like a cervical spine problem.  There was  associated dyspnea and diaphoresis.  This seems to have cleared.   She works at [employer].  She has been on vacation this week.   Apparently, she saw Dr. Jen Mow, who was concerned about her EKG.  I  reviewed her EKG, and saw no significant abnormalities.  She had some  minor T wave inversion in lead 3, which is nonspecific, and poor R wave  progression due lead placement.  There were no acute changes.  There was  borderline limb voltage for LVH.   In talking to the patient, she currently is not having significant chest  pain.  There have been no palpitations or syncope.  Her review of  systems, otherwise, negative.   CURRENT MEDICATIONS:  Include:  1. Glipizide 10 b.i.d.  2. Synthroid 75 mcg daily.  3. Coreg 12.5 b.i.d.  4. Furosemide 20 b.i.d.  5. Metolazone 5 mg a day.  6. Doxazosin 16 mg nightly.  7. Avapro 300 a day.  8. Aspirin a day.  9. Lipitor 80 a day.   EXAMINATION:  Remarkable for a healthy-appearing middle-aged black  female in no distress.  Weight is 150.  Blood pressure is 130/66.  Pulse 84 and regular.  Afebrile.  Respiratory rate 14.  Affect appropriate.  HEENT:  Unremarkable.  No carotid bruits.  No lymphadenopathy,  thyromegaly, or JVP elevation.  LUNGS:  Clear with good diaphragmatic motion.  No wheezing.  S1 and S2 with no murmurs.  PMI normal.  ABDOMEN:  Benign.  Bowel sounds positive.  No tenderness.  No  hepatosplenomegaly.  No hepatojugular reflux.  No AAA.  Femorals were +2 bilaterally without bruits.  PTs are +2.  There is  trace lower extremity edema.   IMPRESSION:  1. Atypical chest pain.  Previously normal Myoview in January.  I do      not think she needs further workup for coronary disease at this      time.  She will continue aspirin and beta blocker.  2. Question abnormal EKG.  In my mind, this is just poor R wave      progression with lead placement, and an isolated T wave inversion      in lead 3.  We will check a 2-D echocardiogram since we have a  baseline, and make sure there is no new regional wall motion      abnormalities.  3. Hypertension.  Currently well controlled on quite a lot of      medicine.  I will have to look back through her chart and make sure      that she has had renal artery stenosis excluded.  4. Questionable history of peripheral vascular disease.  Patient has      no limitations on walking right now.  I hear no femoral bruits.  In      the future, if she were to develop symptoms, she could have ABIs.      I suspect that part of her cramping in the past has been due to her      diuretics, and possible restless leg syndrome.  5. Hyperlipidemia in the setting of multiple coronary risk factors and      diabetes.  Continue Lipitor 80 mg a day.  Follow up lipid and liver      profile in 6 months.  6. Diabetes.  Hemoglobin A1c quarterly per Dr. Jen Mow.  Patient has no      chronic renal insufficiency or retinal problems.  7. 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  Continue replacement.  TSH and      T4 in 6 months.     [name]. Eden Emms, MD, [facility]  Electronically Signed    PCN/MedQ  DD: 04/06/2007  DT: 04/06/2007  Job #: [id number]

## 2010-10-12 NOTE — Discharge Summary (Signed)
[name], [name]               ACCOUNT NO.:  [id number]   MEDICAL RECORD NO.:  [id number]          PATIENT TYPE:  INP   LOCATION:  2033                         FACILITY:  [facility]   PHYSICIAN:  Noralyn Pick. Eden Emms, MD, FACCDATE OF BIRTH:  1945-07-18   DATE OF ADMISSION:  04/14/2007  DATE OF DISCHARGE:  04/17/2007                               DISCHARGE SUMMARY   ADDENDUM:  [id number]   Please refer to that dictation for complete details.   The patient has informed me that she lives in [place] and prefers to  follow up there.  We have therefore changed her cardiology follow-up  appointment to Dr. Charlton Haws in our regional office on May 09, 2007, at 2:15 p.m.      [name], ANP      Noralyn Pick. Eden Emms, MD, [facility]  Electronically Signed    CB/MEDQ  D:  04/17/2007  T:  04/17/2007  Job:  [id number]

## 2010-10-12 NOTE — Consult Note (Signed)
[name], [name]               ACCOUNT NO.:  [id number]   MEDICAL RECORD NO.:  [id number]          PATIENT TYPE:  INP   LOCATION:  2033                         FACILITY:  [facility]   PHYSICIAN:  Dyke Maes, M.D.DATE OF BIRTH:  10/24/1945   DATE OF CONSULTATION:  04/17/2007  DATE OF DISCHARGE:  04/17/2007                                 CONSULTATION   REFERRING PHYSICIAN:  Theron Arista C. Eden Emms, MD.   REASON FOR CONSULTATION:  Chronic renal insufficiency.   HISTORY OF PRESENT ILLNESS:  A 65 year old black female admitted on  April 14, 2007, for nausea, neck pain and diaphoresis, with thoughts  that this could be an anginal equivalent.  Cardiac workup has been  unremarkable with an echo that showed normal EF and negative enzymes.  [name] on her way to be discharged today but a renal consult was called  prior to discharge so she could be followed up as an outpatient.  She  has a history of chronic kidney disease and has seen Dr. Kristian Covey in  [place] before, approximately a year ago.  The details of this are  unknown.  She had a renal ultrasound in February 2007 as per this  workup, which showed the right kidney 11.5, the left kidney at 12.5 cm,  with a cyst on the left.  Serum creatinines in February 2007 were 1.33,  March 2008 1.1, August 2008 1.4, November 17 2.1, November 18 1.8.  Her  blood pressure was low on admission though it is better now.  She says  she was on lisinopril and Avapro at home, though this is not mentioned  in the admitting H&P.  She has had diabetes for 30 years, complicated by  retinopathy and neuropathy, has had high blood pressure as well of  uncertain duration.  She denies any history of gross hematuria, kidney  stones or use of nonsteroidal medications.   PAST MEDICAL HISTORY:  1. Diabetes x30 years, complicated by retinopathy and neuropathy.  2. Hypertension of uncertain duration.  3. History of hypothyroidism.  4. Status post  cholecystectomy.   ALLERGIES:  None.   Medications currently include:  1. Aspirin 81 mg a day.  2. Cardura 8 mg q.h.s.  3. Glipizide 10 mg b.i.d.  4. Lantus 20 units q.h.s.  5. Avapro 300 mg a day, though this was held yesterday and resumed      today at 150 mg a day.  6. Synthroid 75 mcg a day.  7. Demadex 20 mg b.i.d.   SOCIAL HISTORY:  One pack per day smoker for 40 years and she continues  to smoke.  She denies alcohol use.  [name] married and lives with her  husband in [place].  She has two children.   FAMILY HISTORY:  Mother died of complications of diabetes.  Father died  of an MI.  No family history of renal disease.   REVIEW OF SYSTEMS:  Appetite is good.  Energy level is good.  No  shortness of breath, PND, orthopnea.  No chest pains or chest pressures.  No new arthritic complaints.  She has numbness  in her hands and feet.  No dysuria.  No obstructive-type symptoms.   PHYSICAL EXAM:  Blood pressure 135/71, pulse 68, temperature 97.7.  GENERAL:  A 65 year old black female in no acute stress.  HEENT:  Sclerae are nonicteric.  Extraocular muscles are intact.  NECK:  No JVD.  No lymphadenopathy.  No bruits.  LUNGS:  A few crackles in the bases.  HEART:  Regular rate and rhythm without murmur, rub or gallop.  ABDOMEN:  Positive bowel sounds, nontender, nondistended.  No  hepatosplenomegaly.  I do hear a soft bruit over the right renal artery.  I do not appreciate one on the left side, though her bowel sounds were  active.  EXTREMITIES:  No clubbing, cyanosis or edema.  Pulses are 2/4 in the  carotids, radials, femorals, 1/4 in the dorsalis pedis, 0/4 in the  posterior tibial.  NEUROLOGIC:  The cranial nerves are intact.  Decreased sensation in both  feet.  Motor intact.  No asterixis.   LABORATORY:  Sodium 137, potassium 3.7, bicarb 27, BUN 63, creatinine  1.8.  Hemoglobin is 11.6, white count 6.3, platelet count 187,000.  Hemoglobin A1c was 10.8.   IMPRESSION:   1. Chronic kidney disease, most likely secondary to a combination of      diabetes and hypertension with a slight increase in serum      creatinine related to her hypotension, which is now improving.  2. Possible renal artery stenosis.  3. Diabetes.  4. Hypertension.   PLAN:  1. Will check a urinalysis and serum protein electrophoresis prior to      discharge.  I agree with obtaining renal artery Dopplers as an      outpatient, and I would appreciate getting a copy of those.  2. I would continue the Avapro but would hold off on the lisinopril.  3. There is some confusion in regards to her outpatient medicines.      Her son will fax me a list of her medications.  4. [name] okay to go from my standpoint.  I will make arrangements for      her to follow up with me in 1-2 months.   Thank you very much for the consult.  I will follow the patient with  you.           ______________________________  Dyke Maes, M.D.     MTM/MEDQ  D:  04/17/2007  T:  04/18/2007  Job:  [id number]

## 2010-10-12 NOTE — Discharge Summary (Signed)
[name], [name]               ACCOUNT NO.:  [id number]   MEDICAL RECORD NO.:  [id number]          PATIENT TYPE:  INP   LOCATION:  2033                         FACILITY:  [facility]   PHYSICIAN:  Sheila Pick. Eden Emms, MD, FACCDATE OF BIRTH:  19-Mar-1946   DATE OF ADMISSION:  04/14/2007  DATE OF DISCHARGE:  04/17/2007                               DISCHARGE SUMMARY   PRIMARY CARDIOLOGIST:  Sheila Frieze. Jens Som, MD, [facility]   NEPHROLOGIST:  [name], M.D.   DISCHARGE DIAGNOSIS:  Orthostasis in the setting of dehydration.   SECONDARY DIAGNOSES:  1. Acute on chronic renal insufficiency.  2. Hypertension.  3. Type 2 diabetes mellitus.  4. Generalized weakness.  5. Status post cholecystectomy.  6. History of knee surgery.   ALLERGIES:  NO KNOWN DRUG ALLERGIES.   PROCEDURES:  None.   CONSULTANT:  Nephrology, Dr. Briant [name].   HISTORY OF PRESENT ILLNESS:  A 65 year old female who presented to the  emergency room on April 14, 2007, with complaints of generalized  weakness, diaphoresis, nausea and neck pain.  Symptoms were most likely  to occur while standing.  In the ED, her ECG showed no acute changes and  cardiac markers were negative.  She was noted to have elevated BUN and  creatinine 66 and 2.3 which was significantly above baseline.  She was  admitted for further evaluation.   HOSPITAL COURSE:  The patient ruled out for MI.  She was felt to be  dehydrated with prerenal azotemia.  Therefore her ARB and diuretics were  held while she was gently hydrated.  With hydration, she had symptomatic  improvement as well as improvement in BUN and creatinine.  Nephrology  was consulted and she was seen by Dr. Briant [name] who recommended  urinalysis and SPEP prior to discharge along with outpatient nephrology  follow-up.  We have arranged for outpatient renal artery ultrasound to  rule out stenosis as well as an outpatient adenosine Myoview and follow-  up with Dr. Jens [name].  Patient is  being discharged home today in  satisfactory condition.   DISCHARGE LABORATORY DATA:  Hemoglobin 11.6, hematocrit 35.6, WBC 6.3,  platelets 187, MCV 69.8, neutrophils 60.  Sodium 137, potassium  3.7,  chloride 104, CO2 27, BUN 63, creatinine 0.85, glucose 232.  Cardiac  markers negative x3.  Calcium 8.6. BNP 252.  Hemoglobin A1c 10.8.  SPEP  and urinalysis are pending.   DISPOSITION:  The patient is being discharged home today in good  condition.   FOLLOW-UP PLANS AND APPOINTMENTS:  She has an adenosine Myoview  scheduled for April 19, 2007, at 11:45 a.m. at our [place] office.  She will have a renal artery ultrasound on May 10, 2007, at 10:00  a.m.  Finally, she was to follow up with Dr. Jens [name] on May 11, 2007, at 9:45 a.m.  She will also follow up with Dr. Briant [name], per  nephrology arrangements.   DISCHARGE MEDICATIONS:  1. Avapro 150 mg daily.  2. Demadex 10 mg daily.  3. Cardura 8 mg nightly.  4. Glipizide 10 mg b.i.d.  5. Synthroid 75 mcg daily.  6. Lantus 10 units nightly.  7. Aspirin 81 mg daily.   OUTSTANDING LABORATORY STUDIES:  None.   DURATION OF DISCHARGE ENCOUNTER:  40 minutes including physician time.      [name], ANP      Sheila Pick. Eden Emms, MD, [facility]  Electronically Signed    CB/MEDQ  D:  04/17/2007  T:  04/18/2007  Job:  [id number]   cc:   [name], M.D.

## 2010-10-12 NOTE — Op Note (Signed)
NAME:  [name], [name]               ACCOUNT NO.:  [id number]   MEDICAL RECORD NO.:  [id number]          PATIENT TYPE:  AMB   LOCATION:  SDS                          FACILITY:  [facility]   PHYSICIAN:  Jillyn Hidden A. Rankin, M.D.   DATE OF BIRTH:  [birth year]   DATE OF PROCEDURE:  01/08/2007  DATE OF DISCHARGE:                               OPERATIVE REPORT   PREOPERATIVE DIAGNOSES:  1. 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  2. Clinically insignificant macular edema of the right eye.  3. History of proliferative diabetic retinopathy, right eye.   POSTOPERATIVE DIAGNOSES:  1. 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  2. Clinically insignificant macular edema of the right eye.  3. History of proliferative diabetic retinopathy, right eye.  4. Macular pucker, internal limiting membrane.   PROCEDURES:  1. Posterior vitrectomy and membrane peel, 25 gauge, internal limiting      membrane, with topographic distortion release.  2. Endolaser panphotocoagulation, right eye.   SURGEON:  Alford Highland. Rankin, M.D.   ANESTHESIA:  Local retrobulbar with monitored anesthetic control.   INDICATIONS FOR PROCEDURE:  The patient is a 65 year old woman, who has  profound blurred vision in the right eye on the basis of chronic cystoid  macular edema, which has persisted despite maximally tolerated medical  therapy.  The patient understands this is an attempt to release the  tractional forces of vitreomacular traction syndrome, as well as the  hyaloid base, which contributes to progression of this disorder in this  setting.  The patient understands the risks of anesthesia, including the  rare occurrence of death, but also to the eye from the condition, as  well as the surgical repair, including but not limited to hemorrhage,  infection, scarring, need for further surgery, no change in vision, loss  of vision and progression of disease despite intervention.  Appropriate  signed consent was obtained and  the patient was taken to the operating  room.   DESCRIPTION OF PROCEDURES:  In the operating room, appropriate  monitoring was followed by mild sedation.  Marcaine 0.75% was delivered,  5 mL retrobulbar, followed by an additional 5 mL laterally in the  fashion of a modified [employer]  The right  periocular region was  sterilely prepped and draped in the usual ophthalmic fashion.  A lid  speculum was applied.  A 25-gauge trocar was placed in the  inferotemporal quadrant.  The infusion was then turned on.  A superior  trocar was applied.  Core vitrectomy was then begun.  Physician-induced  posterior vitreous detachment was created after core vitrectomy was  completed.  The hyaloid was moved off the posterior pole and anterior to  the equator 360 degrees.  Thereafter, the vitreous base was trimmed.  Endolaser photocoagulation was placed peripherally, and then in areas  that had previously not been treated.  Forceps were then used to engage  the internal limiting membrane, which was moved off the parafoveal  region.   No complications occurred.  Significant CME was noted.   At this time, the instruments were removed from the eye.  The trocars  were removed.  Subconjunctival Decadron applied.  A sterile patch and a  Fox shield were applied.  The patient was taken to the Short Stay area  and discharged home as an outpatient.      Alford Highland Rankin, M.D.  Electronically Signed     GAR/MEDQ  D:  01/08/2007  T:  01/08/2007  Job:  [id number]

## 2010-10-12 NOTE — Consult Note (Signed)
NEW PATIENT CONSULTATION   [name], Sheila S  DOB:  June 06, 1945                                       07/23/2009  ZOXWR#:[id number]   I saw the patient in the office today in consultation for hemodialysis  access.  She was referred by Dr. Briant Cedar.  This is a pleasant 65-year-  old right-handed woman with progressive chronic renal insufficiency who  is not yet on dialysis.  I believe her renal insufficiency is secondary  to diabetes and hypertension.  She has had no recent uremic symptoms  except for some generalized fluid retention and weight gain and  occasional shortness of breath with exertion.  She has had no nausea or  vomiting.   PAST MEDICAL HISTORY:  1. Significant for chronic kidney disease.  2. Type 2 diabetes which has been stable on her current medications.  3. Hypertension.  4. Hypercholesterolemia, both of which are stable on her current      medications.   Her past medical history is otherwise unremarkable.  She denies any  history of previous myocardial infarction, history of congestive heart  failure.   FAMILY HISTORY:  There is no history of premature cardiovascular  disease.   SOCIAL HISTORY:  [name] single.  She has two children.  She smokes half  a pack per day of cigarettes and has been smoking for 30 years.   MEDICATIONS:  Medications are documented on the medical history form in  her chart.   ALLERGIES:  No known drug allergies.   REVIEW OF SYSTEMS:  GENERAL:  She has had some weight gain.  [name] 195  pounds.  She has had no loss of appetite or fever.  CARDIOVASCULAR:  She has had no chest pain, chest pressure, palpitations  or arrhythmias.  She does admit to some dyspnea on exertion.  She has  had no claudication, rest pain or nonhealing ulcers.  She has had no  history of stroke or TIAs.  She has had no history of DVT or phlebitis.  GI:  She has had occasional constipation.  She has had no other change  in her bowel habits  and has no history of peptic ulcer disease.  PULMONARY:  She has had occasional wheezing.  MUSCULOSKELETAL:  She has had joint pain.  GU, neurologic, psychiatric, ENT, hematologic and integumentary review  of systems is unremarkable and is documented on the medical history form  in her chart.   PHYSICAL EXAMINATION:  General:  This is a pleasant 65 year old woman  who appears her stated age.  Vital signs:  Her blood pressure is 138/66,  heart rate is 76, temperature is 98.  HEENT:  Unremarkable.  Lungs:  Clear bilaterally to auscultation without rales, rhonchi or wheezing.  Cardiovascular:  I do not detect any carotid bruits.  She has a regular  rate and rhythm.  She has some mild peripheral edema.  Both feet are  warm and well-perfused without ischemic ulcers.  Abdomen:  Soft and  nontender with normal pitched bowel sounds.  No masses appreciated.  Musculoskeletal:  There are no major deformities or cyanosis.  Neurological:  She has no focal weakness or paresthesias.  Skin:  There  are no ulcers or rashes.   I did independently interpret her vein mapping today which shows that  her forearm cephalic vein bilaterally is fairly small  at the wrist but  the upper arm looks reasonable size.  The basilic vein is patent  bilaterally and is also reasonable size.   I have also reviewed her labs which show a parathyroid hormone of 179.  Hemoglobin is 11.4.   IMPRESSION:  I have recommended that we explore her left forearm  cephalic vein and if this is adequate place a Cimino fistula.  If this  is not adequate we would explore upper arm cephalic vein on the left for  an upper arm brachial cephalic fistula.  If neither are adequate she  could potentially be a candidate for basilic transposition.  Finally if  there was no chance for a fistula on the left side we have discussed  placing an AV graft.  I have discussed the indications for surgery and  the potential complications including but not  limited to failure of the  fistula to mature, graft thrombosis, graft infection and steal syndrome.  I have also discussed the risk of wound healing problems and arm  swelling.  All of her questions were answered and she is agreeable to  proceed.  Her surgery has been scheduled for 07/31/2009.     Di Kindle. Edilia Bo, M.D.  Electronically Signed   CSD/MEDQ  D:  07/23/2009  T:  07/24/2009  Job:  [id number]   cc:   Dyke Maes, M.D.

## 2010-10-14 ENCOUNTER — Ambulatory Visit: Payer: Medicare Other | Admitting: Vascular Surgery

## 2010-10-15 NOTE — Op Note (Signed)
NAME:  [name], [name]               ACCOUNT NO.:  [id number]   MEDICAL RECORD NO.:  [id number]          PATIENT TYPE:  AMB   LOCATION:  DAY                           FACILITY:  [facility]   PHYSICIAN:  R. Roetta Sessions, M.D. DATE OF BIRTH:  Dec 15, 1945   DATE OF PROCEDURE:  09/28/2005  DATE OF DISCHARGE:                                 OPERATIVE REPORT   PROCEDURE:  Diagnostic colonoscopy.   INDICATION FOR PROCEDURE:  The patient is a 65 year old lady with a mixed  anemia of chronic disease and iron deficiency with no known GI bleeding.  [name] pretty much devoid of any GI symptoms.  She reports having a  colonoscopy many years ago without significant findings.  She has been  referred by Dr. Jen Mow for colonoscopy at this time.  There is no family  history of colorectal neoplasia.  Colonoscopy is now being done.  This  approach has been discussed with the patient at length, the potential risks,  benefits, and alternatives have been reviewed, questions answered.  [name]  agreeable.  Please see the documentation in the medical record.   PROCEDURE NOTE:  O2 saturation, blood pressure, pulse, and respiration were  monitored throughout the entire procedure.   CONSCIOUS SEDATION:  Versed 4 mg IV, Demerol 75 mg IV in divided doses.   INSTRUMENT USED:  Olympus video chip system.   FINDINGS:  Digital rectal exam revealed no abnormalities.  Endoscopic  findings:  Prep was adequate.   Rectum:  Examination of the rectal mucosa including retroflexed view of the  anal verge revealed some internal hemorrhoids.   Colon:  The colonic mucosa was surveyed from the rectosigmoid junction  through the left, transverse and right colon to the area of the appendiceal  orifice, ileocecal valve and cecum.  These structures were well-seen and  photographed for the record.  From this level the scope was slowly withdrawn  and all previously-mentioned mucosal surfaces were again seen.  The patient  had scattered  left-sided diverticula.  The remainder of the colonic mucosa  appeared normal.  The patient tolerated the procedure well and was reacted  in endoscopy.   IMPRESSION:  1.  Internal hemorrhoids, otherwise normal rectum.  2.  Scattered left-sided diverticula.  The remainder of the colonic mucosa      appeared normal.   RECOMMENDATIONS:  1.  Diverticulosis literature given to [name].  2.  Should this lady have future GI symptoms, we will be happy to see her in      consultation.  3.  Recommend repeat screening colonoscopy 10 years.      Jonathon Bellows, M.D.  Electronically Signed     RMR/MEDQ  D:  09/28/2005  T:  09/29/2005  Job:  [id number]   cc:   Dr. Jen Mow

## 2010-10-15 NOTE — Op Note (Signed)
[name], [name]               ACCOUNT NO.:  [id number]   MEDICAL RECORD NO.:  [id number]          PATIENT TYPE:  AMB   LOCATION:  DAY                           FACILITY:  [facility]   PHYSICIAN:  Trish Fountain, MD    DATE OF BIRTH:  07-06-1945   DATE OF PROCEDURE:  12/29/2004  DATE OF DISCHARGE:                                 OPERATIVE REPORT   /PREOPERATIVE DIAGNOSIS:  Cataract, left eye.   POSTOPERATIVE DIAGNOSIS:  Cataract, left eye.   SURGERY:  Kelman phacoemulsification, left eye, with posterior chamber  intraocular lens, left eye.   ANESTHESIA:  MAC with topical anesthesia of the left eye.   SURGEON:  Trish Fountain, MD   SPECIMENS:  None.   COMPLICATIONS:  None.   HISTORY:  This is a 65 year old female who has progressive decrease in  vision in the left eye.   Lens model is AMO CLRFLXC 22.0 diopter lens, serial # [id number].   DESCRIPTION OF PROCEDURE:  In the preoperative area, the patient had  Cyclogyl and Neo-Synephrine drops in the left eye in order to dilate the eye  along with Tetracaine to help anesthetize the eye.  Once the patient's left  eye was dilated, the patient was taken to the operating room and prepped.  The left eye was prepped and draped in the usual sterile manner.  A lid  speculum was placed in the left eye, and 2% Xylocaine jelly was placed in  the left eye as well.  A paracentesis was made through clear cornea at the  limbus at approximately the 5 o'clock position of the left eye.  Nonpreserved Xylocaine 1% 1 cc was placed into the anterior chamber for one  minute.  Viscoat was then used to fill the anterior chamber.  Using a 2.75  mm blade at the 3 o'clock position, an incision into the anterior chamber  was made through clear cornea near the limbus.  Viscoat was again used to  reform the anterior chamber.  A 25 gauge bent capsulotomy needle was used to  begin the capsulorrhexis through the anterior capsule of the lens.  Utrata  forceps  were used to make a 360 degree anterior capsulorrhexis.  A Chang 27  gauge irrigating cannula was used to hydrodissect and hydrodelineate the  nucleus.  Once hydrodissection and hydrodelineation was carried out, [facility]  phacoemulsification was used to make a deep groove in the lens nucleus.  The  lens was rotated 360 degrees and divided into four quadrants using deep  grooves made by phacoemulsification with the [facility] phacoemulsification tip.  The nucleus was then divided using the phaco tip and the nucleus  manipulator.  The nuclear quadrants were then removed using  phacoemulsification.  The irrigation aspiration was then used to remove most  of the remainder of the cortex, but as the cortex beneath the incision site  was being removed a small tear occurred in the posterior capsule.  Viscoat  was used to plug this tear and as much cortex was removed as could safely be  removed with the irrigation in the lowest position.  A small amount of  cortical material remained at the 3:00 and 9:00 positions.  The anterior  chamber and posterior capsule was filled with Provisc, and the 3 o'clock  position incision was slightly widened, using the same 2.75 mm blade that  was initially used to make the incision.  An intraocular lens was placed in  the shooter, and this was placed in the eye. Using the [place], the haptics  were placed in the sulcus, on top of the intact anterior capsule. The  intraocular lens was well-centered. Miochol was placed in the anterior  chamber to constrict the pupil and the wound was checked with Weck-cels to  make certain that no vitreous was present in the anterior chamber or in the  wound.  Irrigation/aspiration was then used to remove Provisc from the  anterior chamber.  Two 10-0 nylon sutures were used to close the 3:00  incision site.  The incision site was then checked for water tightness,  using a Weck-cel.  Half-strength Betadine solution was placed, 1 drop, in   the inner canthus, and 1 drop in the outer canthus.  After one minute, this  was rinsed from the eye.  Drops were placed in the eye, Vigamox, followed by  Nevanac followed by Econopred.  A shield was placed over the patient's left  eye, and the patient was sent to the recovery room in satisfactory  condition.       PVK/MEDQ  D:  12/30/2004  T:  12/31/2004  Job:  [id number]

## 2010-10-15 NOTE — Procedures (Signed)
[name], [name]               ACCOUNT NO.:  [id number]   MEDICAL RECORD NO.:  [id number]          PATIENT TYPE:  OUT   LOCATION:  RAD                           FACILITY:  [facility]   PHYSICIAN:  Vida Roller, M.D.   DATE OF BIRTH:  1946-05-16   DATE OF PROCEDURE:  06/24/2005  DATE OF DISCHARGE:                                  ECHOCARDIOGRAM   Tape number [id number], tape count 0 through 397.   This is a 65 year old female with chest pain and hypertension. Technical  quality of this study is adequate.   M-MODE TRACINGS:  Aorta is 25 mm.   Left atrium is 39 mm.   The septum is 13 mm.   Posterior wall is 14 mm.   Left ventricular diastolic dimension is 38 mm.   Left ventricular systolic dimension is 30 mm.   TWO-D AND DOPPLER IMAGING:  The left ventricle is normal size with normal  systolic function. Estimated ejection fraction is 60 to 65%. There is mild  concentric left ventricular hypertrophy. There are no wall motion  abnormalities seen.   The right ventricle is normal size with normal systolic function.   Both atria appear to be normal size.   The aortic valve is without stenosis or regurgitation.   The mitral valve is without stenosis or regurgitation.   The tricuspid valve has trace regurgitation.   There is no pericardial effusion.   The ascending aorta and inferior vena cava appear to be normal size.      Vida Roller, M.D.  Electronically Signed     JH/MEDQ  D:  06/24/2005  T:  06/24/2005  Job:  [id number]

## 2010-10-15 NOTE — Op Note (Signed)
[name], [name]               ACCOUNT NO.:  [id number]   MEDICAL RECORD NO.:  [id number]          PATIENT TYPE:  OIB   LOCATION:  5707                         FACILITY:  [facility]   PHYSICIAN:  Alford Highland. Rankin, M.D.   DATE OF BIRTH:  02-08-1946   DATE OF PROCEDURE:  07/19/2005  DATE OF DISCHARGE:                                 OPERATIVE REPORT   PREOPERATIVE DIAGNOSES:  1.  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  2.  Progressive clinically significant macular edema of left eye.  3.  Progressive proliferative diabetic retinopathy, left eye.   POSTOPERATIVE DIAGNOSES:  1.  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  2.  Progressive clinically significant macular edema of left eye.  3.  Progressive proliferative diabetic retinopathy, left eye.   PROCEDURES:  1.  Posterior vitrectomy with panretinal photocoagulation, left eye, with      #2.  2.  Membrane peel, left eye.   SURGEON:  Alford Highland. Rankin, M.D.   ANESTHESIA:  General endotracheal anesthesia.   INDICATION FOR PROCEDURE:  The patient is a 65 year old woman who has  profound vision and visual difficulties on the basis of recurrent macular  edema as well as vitreo-macular interface issues.  The patient understands  the risks of anesthesia and the rare occurrence of death, but also to the  eye including, but not limited to, hemorrhage, infection, scarring, need for  further surgery, no change in vision, loss of vision and progression of  disease despite intervention.  Appropriate signed consent was obtained and  the patient was taken to the operating room.   DESCRIPTION OF PROCEDURE:  Initially, appropriate monitoring was then  followed by mild sedation.  Successful placement of __________  retrobulbar  was applied.  The patient, however, had some difficulty with persistent  awake, but nonetheless coughing and on discussion with the patient, decision  was made to place general anesthesia.  At this time, general  endotracheal  anesthesia was instituted without difficulty.  The left periocular region  was sterilely prepped and draped in the usual ophthalmic fashion.  A lid  speculum was applied.  A 25-gauge vitrectomy was then carried out with the  infusion placed in the inferotemporal quadrant.  Superior trocars were  applied.  Core vitrectomy was then begun with a BIOM guidance using the  microscope.   At this time, a posterior vitreous detachment was created with active  suctioning nasal to the optic nerve.  Vitreous was then elevated into the  equator 360 degrees and trimmed.  Endolaser photocoagulation was placed  peripherally and posteriorly.  At this time, no complications occurred.  The  superior trocars were removed under infusion pressures.  The infusion wound  was similarly closed.  Subconjunctival injections of Decadron and antibiotic  were  applied.  A sterile patch and Fox shield were applied to the left eye.  The  patient tolerated the procedure without complication.  The patient was  awakened from anesthesia and taken to the recovery room initially in good  and stable condition.      Alford Highland Rankin,  M.D.  Electronically Signed     GAR/MEDQ  D:  07/19/2005  T:  07/20/2005  Job:  [id number]

## 2010-10-15 NOTE — Consult Note (Signed)
NAMEANTHA, NIDAY NO.:  [id number]   MEDICAL RECORD NO.:  [id number]          PATIENT TYPE:  OIB   LOCATION:  5707                         FACILITY:  [facility]   PHYSICIAN:  Hettie Holstein, D.O.    DATE OF BIRTH:  Oct 13, 1945   DATE OF CONSULTATION:  07/19/2005  DATE OF DISCHARGE:                                   CONSULTATION   PRIMARY CARE PHYSICIAN:  Valla Leaver, Ph.D.   REFERRING PHYSICIAN:  Jillyn Hidden A. Rankin, M.D.   REASON FOR CONSULTATION:  Postoperative/post general anesthesia persistent  sedation, obtundation.   HISTORY OF PRESENT ILLNESS:  Ms. Koranda is a 65 year old female who resides  in [place] who has just undergone a pars vitrectomy for retinal  detachment and macular edema, who had to be transitioned to general  anesthesia during the procedure today due to inability to sedate her with  noninvasive means. In any event, we were asked to see in consultation  because post anesthesia she continued to be obtunded in postanesthesia care  unit requiring a period of continued bagging. She did receive neostigmine,  Narcan, and Romazicon. Subsequently, she did become fully awake and alert.  She had gone down to the CT scanner to rule out acute bleed. Now this was  negative. There was no evidence of bleed in the postanesthesia care unit.  [name] now, at my assessment at 11:45 p.m., fully alert, awake, answering  questions appropriately, moving all four extremities without difficulty and  in no respiratory distress.   On review of the operative and perioperative record, hemodynamically she did  have labile episodes with a drop in the heart rates, blood pressures, and  administration of ephedrine with increase of blood pressure to 220/140.  Postoperatively she received labetalol with adequate control. Dr. Katrinka Blazing  followed her post anesthesia and as noted above received the reversal agents  with slow response. In any event, she appears to be doing fairly  well at  this time.   PAST MEDICAL HISTORY:  Longstanding diabetes with retinopathy as well as  proteinuria and nephropathy.  She does have history of longstanding  hypertension. She has a prior history of multinodular goiter, carotid  stenosis, hypercholesterolemia, hypertriglyceridemia. She does have a  cardiologist and has undergone cardiac evaluation in January due to an  episode of bradycardia that postponed her surgery at that time. Her ejection  fraction on that study revealed an EF of 60% to 65% with mild concentric LVH  most probably consistent with diastolic dysfunction.   MEDICATIONS AT HOME:  1.  Metformin 1 mg b.i.d.  2.  Glipizide 10 mg p.o. b.i.d.  3.  Lipitor 80 mg daily.  4.  Aspirin 81 mg daily.  5.  Iron sulfate 325 mg daily.  6.  Lisinopril 20 mg daily.  7.  Doxycycline 100 mg p.o. b.i.d.  8.  Hyzaar 100/12.5 mg daily.   ALLERGIES:  No known drug allergies.   SOCIAL HISTORY:  The patient does not smoke cigarettes. She does not abuse  alcohol.   FAMILY HISTORY:  Noncontributory.   REVIEW OF SYSTEMS:  Noncontributory.   PHYSICAL EXAMINATION:  VITAL SIGNS: In the PACU the patient's vital signs  were stable with blood pressure 100/55, heart rate in the 80s, and she was  afebrile.  GENERAL: [name] she is alert and oriented, moving all four extremities. She  is answering questions appropriately.  CARDIOVASCULAR: Normal S1 and S2.  LUNGS: Clear to auscultation.  ABDOMEN: Nontender.  EXTREMITIES: No edema.  NEUROLOGIC: As noted above. The patient is alert and oriented times three.  Moving all four extremities. Answering questions appropriately. [name] quite  cranky, but appropriate.   LABORATORY DATA:  From yesterday revealed white blood cell count of 9.3,  hemoglobin 9.2, platelet count 302,000. Sodium 137, potassium 3.4, BUN 25,  creatinine 1.3, blood glucose 175.   CT of the head was negative.   ASSESSMENT:  1.  Post general anesthesia  sedation/obtundation with slow response to      reversal agents. It is not clearly understood why this has occurred. It      is a possibility that it may be related to her other agents which she is      currently taking at baseline including Lipitor and metformin.  2.  Labile hypertension.  3.  Diabetes mellitus, longstanding with retinopathy and nephropathy.  4.  Postoperative pars vitrectomy.  5.  Low potassium.   PLAN:  At this time we are going to continue to follow in the PACU until she  is ambulatory and fully recovered. Would recommend continuing her home  medications that she was on prior to discharge and supervision over the next  one or two days. Would recommend follow up with primary care physician this  week as her blood pressure was a bit labile, but currently stable.      Hettie Holstein, D.O.  Electronically Signed     ESS/MEDQ  D:  07/19/2005  T:  07/19/2005  Job:  [id number]   cc:   Valla Leaver, Ph.D.   Alford Highland. Rankin, M.D.  Fax: [phone]

## 2010-10-15 NOTE — Procedures (Signed)
[name], [name]               ACCOUNT NO.:  [id number]   MEDICAL RECORD NO.:  [id number]          PATIENT TYPE:  OUT   LOCATION:  RAD                           FACILITY:  [facility]   PHYSICIAN:  Vida Roller, M.D.   DATE OF BIRTH:  1945/07/06   DATE OF PROCEDURE:  06/24/2005  DATE OF DISCHARGE:                                    STRESS TEST   PRIMARY CARE PHYSICIAN:  Dr. Leona Carry.   HISTORY OF PRESENT ILLNESS:  [name] is a 65 year old female with no  known coronary disease. Cardiac risk factors include hypertension, diabetes,  hyperlipidemia, tobacco abuse and family history. She presented to Dr.  Dorethea Clan with complaints of lower extremity edema and weakness and an abnormal  electrocardiogram.   BASELINE DATA:  Electrocardiogram reveals a sinus rhythm at 82 beats per  minute. Poor R-wave progression. Blood pressure is 174/80.   Patient exercised for a total of 3 minutes and 43 seconds and 4.6 METS.  Maximal heart rate was 136 beats per minute which is 84% of predicted  maximum. Maximal blood pressure is 218/88, resolved down to 180/82 in  recovery. The patient reported tightness in the backs of her bilateral lower  extremities. EKG reveals no ischemic changes. Few PVCs were noted.   Final images and results are pending M.D. review. The patient's hypertension  at rest was discussed with Dr. Dorethea Clan. He is recommended to increase  lisinopril to 20 mg daily. The patient has been instructed of this change.  She will follow up with Dr. Dorethea Clan February 5 for repeat blood pressure  checked and the results of her test.      Jae Dire, P.A. [facility]      Vida Roller, M.D.  Electronically Signed    AB/MEDQ  D:  06/24/2005  T:  06/24/2005  Job:  [id number]

## 2010-10-15 NOTE — Op Note (Signed)
NAMEJOHNYE, KIST               ACCOUNT NO.:  [id number]   MEDICAL RECORD NO.:  [id number]          PATIENT TYPE:  OIB   LOCATION:  4707                         FACILITY:  [facility]   PHYSICIAN:  Alford Highland. Rankin, M.D.   DATE OF BIRTH:  06-12-1945   DATE OF PROCEDURE:  06/21/2005  DATE OF DISCHARGE:                                 OPERATIVE REPORT   PREOPERATIVE DIAGNOSIS:  Dense vitreous hemorrhage, left eye.   POSTOPERATIVE DIAGNOSES:  1.  Dense vitreous hemorrhage, left eye.  2.  Bradycardia event causing a halt and aborting of the procedure to be      done.   PROCEDURE PERFORMED:  No procedure was done.   SURGEON:  Alford Highland. Rankin, M.D.   ANESTHESIA:  Local retrobulbar with monitored anesthesia control was  initiated and 0.75% Marcaine was delivered 5 mL retrobulbar followed by an  additional 5 mL laterally in the fashion of modified Darel Hong.  Soon after  delivery of this anesthetic under mild sedation, the patient developed  bradycardic event requiring pharmacological intervention.  She never lost  consciousness.  She complained of nausea and she had some backwash into her  oral cavity which was aspirated without difficulty with suction device.  This event was over in a few moments and prompted discussion that the  patient's procedure not be done.  A sterile patch and Fox shield were  applied to left eye.  The patient was taken to PACU to be observed and to  seek medical consultation.      Alford Highland Rankin, M.D.  Electronically Signed     GAR/MEDQ  D:  06/21/2005  T:  06/22/2005  Job:  [id number]

## 2010-10-15 NOTE — Consult Note (Signed)
NAME:  [name], [name]               ACCOUNT NO.:  [id number]   MEDICAL RECORD NO.:  [id number]          PATIENT TYPE:  AMB   LOCATION:                                FACILITY:  [facility]   PHYSICIAN:  R. Roetta Sessions, M.D. DATE OF BIRTH:  [birth year]   DATE OF CONSULTATION:  09/22/2005  DATE OF DISCHARGE:                                   CONSULTATION   REQUESTING PHYSICIAN:  Dr. Erle Crocker, [facility] in  [place], [place].   REASON FOR CONSULTATION:  Iron deficiency anemia.   HISTORY OF PRESENT ILLNESS:  [name] is a 65 year old lady, a patient of  Dr. Erle Crocker, who presents for further evaluation of iron deficiency  anemia.  She had labs on August 02, 2005 which revealed a hemoglobin of 10.4,  hematocrit of 33.8, MCV 73.5, iron 40, TIBC 269 and iron saturation of 15%  and ferritin was 135.  She has been seeing Dr. Kristian Covey recently and they  are contemplating a renal biopsy due to a significant proteinuria.  She has  diabetes mellitus as well.  She also is being followed by Dr. Patrecia Pace for  thyroid disease.  She denies any fresh blood per rectum, hematochezia,  melena, constipation, diarrhea, abdominal pain, nausea or vomiting,  heartburn, dysuria, dysphagia, odynophagia, or unintentional weight loss.   CURRENT MEDICATIONS:  1.  Lisinopril 40 mg daily.  2.  Glipizide 10 mg two tablets b.i.d.  3.  Caduet 5/180 mg daily.  4.  Metformin 1000 mg b.i.d.  5.  Aspirin 81 mg daily.  6.  Synthroid 75 mg daily.   ALLERGIES:  No known drug allergies.   PAST MEDICAL HISTORY:  1.  Diabetes mellitus.  2.  Hypertension.  3.  Anemia.  4.  Right olecranon bursitis.  5.  Thyroid disease.  6.  Dyspnea on exertion.  7.  Left eye surgery twice, initially for cataract and then for scar tissue      over the cornea.  8.  Cholecystectomy.  9.  Left knee arthroscopy for arthritis.   FAMILY HISTORY:  Mother deceased at age 60, had diabetes.  Father  deceased  at 56, had MI.  No family history of chronic GI illnesses, liver disease or  colorectal cancer.   SOCIAL HISTORY:  [name] married and has 2 children.  [name] employed with  [employer].  She quit smoking 6 years ago and denies any alcohol use.   REVIEW OF SYSTEMS:  GI:  See HPI.  CONSTITUTIONAL:  See HPI.  CARDIOPULMONARY:  Denies any chest pain or shortness of breath at this time.  GENITOURINARY:  Denies any dysuria.   PHYSICAL EXAMINATION:  VITAL SIGNS:  Weight 155, height 5 feet 3 inches.  Temperature 98.2, blood pressure 160/68, pulse 88.  GENERAL:  A pleasant, well-nourished, well-developed black female in no  acute distress.  SKIN:  Warm and dry, no jaundice.  HEENT:  Pupils equal, round and reactive to light.  Conjunctivae are pink.  Sclerae anicteric.  Oropharyngeal mucosa moist and pink, no lesions,  erythema or exudate.  NECK:  No lymphadenopathy or thyromegaly.  LUNGS:  Clear to auscultation.  CARDIAC:  Exam reveals a regular rate and rhythm, normal S1 and S2, no  murmurs, rubs, or gallops.  ABDOMEN:  Positive bowel sounds.  Soft, nontender and non-distended.  No  organomegaly or masses.  No rebound tenderness or guarding.  No abdominal  bruits or hernias.  EXTREMITIES:  No edema.   IMPRESSION:  [name] is a 65 year old lady with microcytic anemia with  decreased iron and iron saturations in the setting of a normal ferritin.  Total iron binding capacity is also normal.  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  She has never had a  colonoscopy.  I would recommend one at this time for further evaluation of  iron deficiency anemia.  We will also have her collect Hemoccults in the  interim.   PLAN:  1.  Begin with colonoscopy in the near future.  2.  Hemoccult stool x3.  3.  Further recommendations to follow.      Tana Coast, P.AJonathon Bellows, M.D.  Electronically Signed    LL/MEDQ  D:  09/22/2005   T:  09/23/2005  Job:  [id number]

## 2010-10-15 NOTE — Op Note (Signed)
NAME:  [name], [name]               ACCOUNT NO.:  [id number]   MEDICAL RECORD NO.:  [id number]          PATIENT TYPE:  AMB   LOCATION:  SDS                          FACILITY:  [facility]   PHYSICIAN:  Jillyn Hidden A. Rankin, M.D.   DATE OF BIRTH:  06/11/1945   DATE OF PROCEDURE:  08/14/2006  DATE OF DISCHARGE:  08/14/2006                               OPERATIVE REPORT   PREOPERATIVE DIAGNOSIS:  Nuclear sclerotic cataract, right eye.   POSTOPERATIVE DIAGNOSIS:  Nuclear sclerotic cataract, right eye.   PROCEDURE:  Phaco extracapsular cataract extraction with intraocular  lens placement - small incision, right eye.  Lens used was an [employer], power +22.0 into the capsular bag.   ANESTHESIA:  Local retrobulbar anesthesia control.   INDICATIONS FOR PROCEDURE:  The patient is a 65 year old woman who has  significant visual loss.  Activities of daily living hampered because of  the right eye cataract.  The patient understands this is an attempt to  correct her visual function with cataract surgery and intraocular lens  placement.  She understands the risks of anesthesia including the risk  of loss of the eye including, but not limited to, hemorrhage, infection,  scarring, need for further surgery, no change in vision, loss of vision,  progressive disease despite intervention.  Appropriate signed consent  was obtained.   DESCRIPTION OF PROCEDURE:  Patient was taken to the operating room.  In  the operating room, appropriate monitoring was applied followed by  moderate sedation.  A retrobulbar injection of anesthetic was applied, 5  mL, followed by an additional 5 mL laterally in fashion modified Darel Hong.  The right periocular region was sterilely prepped and draped in  the usual fashion.  Lid speculum was applied.  A biplanar clear corneal  incision was then carried out with the keratome.  The anterior chamber  was deepened with viscoelastic.  Capsular forceps were then used to  engage  the capsule and this was removed in a continuous circular  fashion.   At this time hydrodissection was then carried out to allow mobilization  of the cortex for the capsule.  Thereafter a phacofragmentation was then  carried out using bimanual technique.  A super blade was then used to  create a paracentesis incision.   Using a phacochop technique, the nucleus and the perinuclear material  was removed.  At this time cortical cleanup was then carried out without  difficulty.  No complications occurred.  The intraocular lens was  inserted through the small incision into the capsular bag with excellent  placement of the haptics.   At this time viscoelastic was irrigated from the anterior chamber.  A  single 10-0 nylon suture was then used for safety purposes.  At this  time subconjunctival Decadron applied.  Subconjunctival Fox shield  applied.  The patient tolerated the procedure without complication.      Alford Highland Rankin, M.D.  Electronically Signed     GAR/MEDQ  D:  10/29/2006  T:  10/29/2006  Job:  [id number]

## 2010-10-15 NOTE — Consult Note (Signed)
[name], [name]               ACCOUNT NO.:  [id number]   MEDICAL RECORD NO.:  [id number]          PATIENT TYPE:  OIB   LOCATION:  4707                         FACILITY:  [facility]   PHYSICIAN:  Jackie Plum, M.D.DATE OF BIRTH:  Sep 05, 1945   DATE OF CONSULTATION:  DATE OF DISCHARGE:                                   CONSULTATION   DATE OF INTERNAL MEDICINE CONSULTATION:  June 21, 2005.   REASON FOR CONSULTATION:  Evaluation of bradycardia.   HISTORY OF PRESENT ILLNESS:  The patient is a 65 year old, African-American  lady with a history of diabetes complicated by retinopathy/cataracts, a  history of hypertension, multinodular goiter, aortic stenosis, anemia, and  dyslipidemia.  The patient was admitted by Dr. Luciana Axe today to the hospital.  Apparently the patient had come to see Dr. Luciana Axe for elective eye surgery,  and during anesthesia the patient was later bradied down to below a 20 heart  rate.  Surgery was consulted, patient was admitted to a telemetry bed for  closer monitoring, and medical consultation was requested.  The patient  denied any antecedent chest pain or shortness of breath.  She indicated that  she felt a little bit nauseated but is feeling great now and does not have  any other significant cardiopulmonary or neurologic symptomatology.  The  patient was seen by Dr. Dorethea Clan of Cardiology for abnormal EKG last Friday,  June 16, 2005.  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  There was no left axis deviation, and the _________ekg is not  available for my review.  During this evaluation, patient was noted to have  multiple cardiac risk factors and abnormal EKG myocardial imaging, and the  plan was to get a two-D echocardiogram to evaluate her left ventricular  systolic function as well as  a stress test.  She was started on Lipitor 80  mg daily and aspirin at the time.  Cardiology also recommended  discontinuation of Verapamil.   PAST MEDICAL HISTORY:  As noted above.   ALLERGIES:  The patient does not have any medication allergies.   CURRENT MEDICATIONS:  Verapamil, Lipitor, aspirin, HCTZ, and Lisinopril.  She also takes Glipizide for her diabetes mellitus, and eye drops.   SOCIAL HISTORY:  The patient smokes cigarettes and does not abuse alcohol.   REVIEW OF SYSTEMS:  As listed above, otherwise unremarkable.  Essentially,  her only complaint was nausea earlier, which had resolved by the time of my  evaluation.   PHYSICAL EXAMINATION:  GENERAL:  The patient was sitting up in bed.  She was  __________ and in no acute distress.  HEENT:  She had an eye patch on her left eye.  The right eye was positive  for mild scleral __________ icterus.  Pupils were round, about 3 mm on the  right and reactive to light.  LUNGS:  No adventitious sounds.  CARDIAC:  The patient does not have any bradycardia.  There were no gallops.  ABDOMEN:  Soft and nontender.  EXTREMITIES:  No cyanosis, trace edema noted.  CNS EXAM:  Nonfocal.   LABORATORY DATA:  X-ray of the chest showed chronic interstitial lung  disease without any acute infiltrate.  WBC count 6.4, hemoglobin 11.2,  hematocrit 33.9, MCV 71.6, platelet count 236.  Sodium 139, potassium 3.7,  chloride 103, CO2 39, glucose 112, BUN 24, creatinine 1.1, calcium 8.8.   IMPRESSION:  Bradycardia in a patient with multiple coronary artery disease  risk factors and I am also __________ plan for cardiac workup.  This is  unlikely to be vasovagal because patient was not hypertensive on account of  the episode of bradycardia and we will need to rule out a cardiac cause of  this.  The patient has seen a cardiologist previously and he had planned for  a cardiac workup.  Since the patient's main issue is primarily cardiac, we  will call  Cardiology.  Dr. Dorethea Clan is actually not on call tonight so we will  call Dr. Daleen Squibb of Cardiology, who will be seeing the patient.  In this  regard, I have taken the privilege to go ahead and order a cardiac workup,  i.e., echocardiogram to have done for her.  I have also made the patient NPO  just in case she is made to do a stress test in the morning if she rules  out, which I think she would.  The patient is no more bradycardic, her heart  rate is 86.  She will be continued on telemetry monitoring.  We will start  the patient on sliding scale insulin for her diabetes.  I have asked that  the patient be on Verapamil and this will be discontinued as was planned by  Dr. Dorethea Clan.  The patient will be continued on her ACE inhibitor, Lipitor,  and HCTZ.  At this point, we will also order a 12-lead electrocardiogram and  TSH with __________, and I will be happy to get involved in the patient's  care should the patient need any other workup especially if it is  noncardiac.   Thank you very much for this consultation.      Jackie Plum, M.D.  Electronically Signed     GO/MEDQ  D:  06/21/2005  T:  06/21/2005  Job:  [id number]   cc:   Vida Roller, M.D.  Fax: [phone]   Jesse Sans. Wall, M.D.  [address]  [address]  [place]  [place] [address]

## 2010-10-21 ENCOUNTER — Ambulatory Visit (INDEPENDENT_AMBULATORY_CARE_PROVIDER_SITE_OTHER): Payer: Medicare Other | Admitting: Vascular Surgery

## 2010-10-21 DIAGNOSIS — N186 End stage renal disease: Secondary | ICD-10-CM

## 2010-10-22 NOTE — Assessment & Plan Note (Signed)
OFFICE VISIT  [name], Sheila S DOB:  [birth year]                                       10/21/2010 EAVWU#:[id number]  HISTORY OF PRESENT ILLNESS:  The patient returns for followup today. She had superficialization of a right brachiocephalic AV fistula and patch angioplasty of the mid section of her fistula with a bovine pericardial patch.  The patient reports no symptoms of numbness or tingling in her hand.  However, she has had some increased drainage and some redness around the central aspect of her fistula over the area where the patch angioplasty was performed.  They are currently not using the fistula.  [name] using a dialysis catheter without difficulty.  She dialyzes on Monday, Wednesday and Friday.  PHYSICAL EXAM:  Blood pressure is 144/65 in the left arm, temperature is 98.4, heart rate 80 and regular.  Right upper extremity:  There is an easily palpable thrill and audible bruit in the fistula.  At the central segment there is some maceration of the skin and some surrounding erythema.  I probed this today and there was some bloody drainage. There was no purulent material.  The fistula is fairly superficial so I did not probe this deeply.  [name] currently on IV antibiotics with dialysis.  I believe the best option at this point would be to continue her intravenous antibiotics.  I gave her instructions today on placing a Vaseline gauze dressing over the fistula with Kerlix to protect the wound.  Hopefully, this will continue to heal over the next few weeks. However, there is certainly a possibility that the patch could be infected; if the wound continues to deteriorate this may need reexploration and possible patch removal.  She will follow up next week. If the wound has continued to deteriorate we will consider removing the patch.  However, if the wound is improved will continue close observation.    Janetta Hora. Sheila Rayos, MD Electronically  Signed  CEF/MEDQ  D:  10/21/2010  T:  10/22/2010  Job:  [id number]  cc:   Mindi Slicker. Lowell Guitar, M.D. [facility]

## 2010-10-28 ENCOUNTER — Ambulatory Visit (INDEPENDENT_AMBULATORY_CARE_PROVIDER_SITE_OTHER): Payer: Medicare Other | Admitting: Vascular Surgery

## 2010-10-28 DIAGNOSIS — N186 End stage renal disease: Secondary | ICD-10-CM

## 2010-10-28 NOTE — Assessment & Plan Note (Signed)
OFFICE VISIT  [name], [name] DOB:  August 19, 1945                                       10/28/2010 [id number]#:[id number]  Patient returns for follow-up today.  She had superficialization of her right brachiocephalic AV fistula and patch angioplasty of the mid section of her fistula with a bovine pericardial patch on Sep 28, 2010. She was last seen on May 24th and was having some drainage from the central aspect of her fistula.  She returns today for further follow-up. [name] currently using a catheter for dialysis and dialyzes Monday, Wednesday, and Friday.  PHYSICAL EXAMINATION:  Blood pressure is 107/61 in the left arm, heart rate is 77 and regular.  Temperature is 97.8.  Right upper extremity, she has two 1-cm openings with serous drainage out of the corners of the previous incision over the area of the patch.  She still has an audible bruit in the fistula.  The swelling in her arm is significantly decreased, and the ulcerations are drier today and appear to be healing at this point, although the drainage is still worrisome.  In summary, the patient has a patent fistula but is still having some difficulty healing up the central wound, although there are some signs of progressive healing at this point.  She will continue to take her antibiotics.  She will follow up in 3 weeks' time or sooner if the wounds continue to deteriorate.  I did discuss with her today that she still may need removal of the patch if the wounds do not heal, but hopefully we will not need to do this and try to salvage the fistula in her right arm.    Janetta Hora. Fields, MD Electronically Signed  CEF/MEDQ  D:  10/28/2010  T:  10/28/2010  Job:  [id number]  cc:   Mindi Slicker. Lowell Guitar, M.D. [facility]

## 2010-11-18 ENCOUNTER — Other Ambulatory Visit (INDEPENDENT_AMBULATORY_CARE_PROVIDER_SITE_OTHER): Payer: Medicare Other

## 2010-11-18 ENCOUNTER — Ambulatory Visit (INDEPENDENT_AMBULATORY_CARE_PROVIDER_SITE_OTHER): Payer: Medicare Other | Admitting: Vascular Surgery

## 2010-11-18 DIAGNOSIS — N186 End stage renal disease: Secondary | ICD-10-CM

## 2010-11-18 DIAGNOSIS — Z48812 Encounter for surgical aftercare following surgery on the circulatory system: Secondary | ICD-10-CM

## 2010-11-18 DIAGNOSIS — T82598A Other mechanical complication of other cardiac and vascular devices and implants, initial encounter: Secondary | ICD-10-CM

## 2010-11-19 ENCOUNTER — Other Ambulatory Visit (HOSPITAL_COMMUNITY): Payer: Self-pay | Admitting: Nephrology

## 2010-11-19 DIAGNOSIS — N186 End stage renal disease: Secondary | ICD-10-CM

## 2010-11-19 NOTE — Assessment & Plan Note (Signed)
OFFICE VISIT  [name], Sheila SAFFRAN DOB:  1946-02-05                                       11/18/2010 EAVWU#:[id number]  The patient returns for followup today.  She previously had a right brachiocephalic AV fistula placed in December of 2011.  She subsequently underwent superficialization of her fistula with a patch angioplasty of the mid section of this in May of 2012.  She had some difficulty healing up the incision and returns for further followup today.  She was last seen on 10/28/2010.  [name] currently using a catheter for dialysis. She dialyzes Monday, Wednesday, Friday.  The patient reports that the incision is completely healed at this point.  PHYSICAL EXAM:  Blood pressure is 161/66 in the left arm, heart rate 79 and regular, respirations 24.  Right upper extremity has a well-healed incision throughout the entire upper arm.  There is an easily palpable thrill in the fistula.  The hand is warm and well-perfused with no sensory deficit.  She had a duplex ultrasound today which showed that the fistula is between 63 and 93 mm in diameter throughout its course.  There may be one small area of narrowing near the previous patch but otherwise this looks good.  I believe the best option at this point would be to give her a few more weeks to continue to heal up the incision and then we will try to start cannulating her fistula in July.  If she has any problems she will return for followup.  Otherwise she will follow up on an as-needed basis.    Janetta Hora. Fields, MD Electronically Signed  CEF/MEDQ  D:  11/18/2010  T:  11/19/2010  Job:  [id number]  cc:   Mindi Slicker. Lowell Guitar, M.D.

## 2010-11-20 ENCOUNTER — Ambulatory Visit (HOSPITAL_COMMUNITY)
Admission: RE | Admit: 2010-11-20 | Discharge: 2010-11-20 | Disposition: A | Discharge status: Home / Self Care | Payer: Medicare Other | Source: Ambulatory Visit | Attending: Nephrology | Admitting: Nephrology

## 2010-11-20 DIAGNOSIS — E039 Hypothyroidism, unspecified: Secondary | ICD-10-CM | POA: Insufficient documentation

## 2010-11-20 DIAGNOSIS — E1129 Type 2 diabetes mellitus with other diabetic kidney complication: Secondary | ICD-10-CM | POA: Insufficient documentation

## 2010-11-20 DIAGNOSIS — I120 Hypertensive chronic kidney disease with stage 5 chronic kidney disease or end stage renal disease: Secondary | ICD-10-CM | POA: Insufficient documentation

## 2010-11-20 DIAGNOSIS — Y832 Surgical operation with anastomosis, bypass or graft as the cause of abnormal reaction of the patient, or of later complication, without mention of misadventure at the time of the procedure: Secondary | ICD-10-CM | POA: Insufficient documentation

## 2010-11-20 DIAGNOSIS — T82898A Other specified complication of vascular prosthetic devices, implants and grafts, initial encounter: Secondary | ICD-10-CM | POA: Insufficient documentation

## 2010-11-20 DIAGNOSIS — N2581 Secondary hyperparathyroidism of renal origin: Secondary | ICD-10-CM | POA: Insufficient documentation

## 2010-11-20 DIAGNOSIS — N186 End stage renal disease: Secondary | ICD-10-CM | POA: Insufficient documentation

## 2010-11-20 DIAGNOSIS — D649 Anemia, unspecified: Secondary | ICD-10-CM | POA: Insufficient documentation

## 2010-11-26 NOTE — Procedures (Unsigned)
VASCULAR LAB EXAM  INDICATION:  A nonmaturing right arteriovenous fistula.  HISTORY: Diabetes:  Yes. Cardiac:  No. Hypertension:  Yes.  EXAM:  Right brachiocephalic Cimino fistula.  IMPRESSION:  An area of narrowing within the cephalic vein at mid humerus level measuring 0.19 to 0.25 cm with increased velocities at 6.36/3.70 meters per second.  I discussed these findings with Dr. Darrick Penna.  ___________________________________________ Janetta Hora Fields, MD  OD/MEDQ  D:  11/19/2010  T:  11/19/2010  Job:  [id number]

## 2010-12-02 ENCOUNTER — Ambulatory Visit: Payer: Medicare Other

## 2010-12-26 ENCOUNTER — Emergency Department (HOSPITAL_COMMUNITY)
Admission: EM | Admit: 2010-12-26 | Discharge: 2010-12-26 | Disposition: A | Discharge status: Home / Self Care | Payer: Medicare Other | Attending: Emergency Medicine | Admitting: Emergency Medicine

## 2010-12-26 DIAGNOSIS — R05 Cough: Secondary | ICD-10-CM | POA: Insufficient documentation

## 2010-12-26 DIAGNOSIS — S139XXA Sprain of joints and ligaments of unspecified parts of neck, initial encounter: Secondary | ICD-10-CM | POA: Insufficient documentation

## 2010-12-26 DIAGNOSIS — S161XXA Strain of muscle, fascia and tendon at neck level, initial encounter: Secondary | ICD-10-CM

## 2010-12-26 DIAGNOSIS — F17200 Nicotine dependence, unspecified, uncomplicated: Secondary | ICD-10-CM | POA: Insufficient documentation

## 2010-12-26 DIAGNOSIS — J069 Acute upper respiratory infection, unspecified: Secondary | ICD-10-CM | POA: Insufficient documentation

## 2010-12-26 DIAGNOSIS — M542 Cervicalgia: Secondary | ICD-10-CM | POA: Insufficient documentation

## 2010-12-26 DIAGNOSIS — R059 Cough, unspecified: Secondary | ICD-10-CM | POA: Insufficient documentation

## 2010-12-26 DIAGNOSIS — X58XXXA Exposure to other specified factors, initial encounter: Secondary | ICD-10-CM | POA: Insufficient documentation

## 2010-12-26 HISTORY — DX: Chronic obstructive pulmonary disease, unspecified: J44.9

## 2010-12-26 HISTORY — DX: Disorder of thyroid, unspecified: E07.9

## 2010-12-26 HISTORY — DX: Essential (primary) hypertension: I10

## 2010-12-26 MED ORDER — FEXOFENADINE-PSEUDOEPHED ER 60-120 MG PO TB12: 1.0000 | ORAL_TABLET | Freq: Two times a day (BID) | ORAL | Status: AC

## 2010-12-26 MED ORDER — HYDROCOD POLST-CHLORPHEN POLST 10-8 MG/5ML PO LQCR
5.0000 mL | Freq: Once | ORAL | Status: AC
  Administered 2010-12-26: 5 mL via ORAL
  Filled 2010-12-26: qty 5

## 2010-12-26 NOTE — ED Provider Notes (Signed)
History     Chief Complaint  Patient presents with  . Neck Pain   Patient is a 65 y.o. female presenting with neck pain. The history is provided by the patient.  Neck Pain  This is a new problem. The current episode started more than 1 week ago. The problem occurs daily. The problem has been gradually worsening. Associated with: cough and congestion. There has been no fever. The pain is present in the right side. Quality: "like sore throat" Radiates to: right ear. The pain is at a severity of 2/10. Pertinent negatives include no photophobia and no chest pain. Associated symptoms comments: Cough and nasal congestion.    Past Medical History  Diagnosis Date  . Renal disorder   . Dialysis care   . COPD (chronic obstructive pulmonary disease)   . Diabetes mellitus   . Hypertension   . Thyroid disease     Past Surgical History  Procedure Date  . Cholecystectomy     History reviewed. No pertinent family history.  History  Substance Use Topics  . Smoking status: Current Everyday Smoker -- 0.5 packs/day    Types: Cigarettes  . Smokeless tobacco: Not on file  . Alcohol Use: No    OB History    Grav Para Term Preterm Abortions TAB SAB Ect Mult Living                  Review of Systems  Constitutional: Negative for activity change.       All ROS Neg except as noted in HPI  HENT: Positive for neck pain and sinus pressure. Negative for nosebleeds.   Eyes: Negative for photophobia and discharge.  Respiratory: Positive for cough. Negative for shortness of breath and wheezing.   Cardiovascular: Negative for chest pain and palpitations.  Gastrointestinal: Negative for abdominal pain and blood in stool.  Genitourinary: Negative for dysuria, frequency and hematuria.  Musculoskeletal: Negative for back pain and arthralgias.  Skin: Negative.   Neurological: Negative for dizziness, seizures and speech difficulty.  Psychiatric/Behavioral: Negative for hallucinations and confusion.     Physical Exam  BP 169/71  Pulse 78  Temp(Src) 98.7 F (37.1 C) (Oral)  Resp 20  Ht 5\' 3"  (1.6 m)  Wt 172 lb (78.019 kg)  BMI 30.47 kg/m2  SpO2 97%  Physical Exam  Nursing note and vitals reviewed. Constitutional: [name] oriented to person, place, and time. She appears well-developed and well-nourished.  Non-toxic appearance.  HENT:  Head: Normocephalic.  Right Ear: Tympanic membrane and external ear normal.  Left Ear: Tympanic membrane and external ear normal.       Mild to mod nasal congestion. Pt has mild to mod hoarseness.  Eyes: EOM and lids are normal. Pupils are equal, round, and reactive to light.  Neck: Normal range of motion. Neck supple. Carotid bruit is not present.  Cardiovascular: Normal rate, regular rhythm, normal heart sounds, intact distal pulses and normal pulses.   Pulmonary/Chest: Breath sounds normal. No respiratory distress.       Dialysis cath in the right upper chest. No evidence of infection.  Abdominal: Soft. Bowel sounds are normal. There is no tenderness. There is no guarding.  Musculoskeletal: Normal range of motion.  Lymphadenopathy:       Head (right side): No submandibular adenopathy present.       Head (left side): No submandibular adenopathy present.    She has no cervical adenopathy.  Neurological: [name] alert and oriented to person, place, and time. She has  normal strength. No cranial nerve deficit or sensory deficit.  Skin: Skin is warm and dry.  Psychiatric: She has a normal mood and affect. Her speech is normal.    ED Course  Procedures  MDM I have reviewed nursing notes, vital signs, and all appropriate lab and imaging results for this patient.      Kathie Dike, [place] 12/26/10 1137

## 2010-12-26 NOTE — ED Notes (Signed)
Pt states has had neck soreness for approximately 2 weeks.  Denies fever, n/v, however does get SOB when up and moving about.  Pt is dialysis and is diabetic.  Spouse has also been  Sick.  Pt states has seen PCP to which she received " cough syrup, antibiotic and inhaler. States has no relief from any of these.

## 2010-12-26 NOTE — ED Provider Notes (Signed)
  I performed a history and physical examination of [name] and discussed her management with Loney Laurence, PA.  I agree with the history, physical, assessment, and plan of care, with the following exceptions: None  I was present for the following procedures: None Time Spent in Critical Care of the patient: None Time spent in discussions with the patient and family: None  Brooke Dare, Roney Mans, MD 12/26/10 1806

## 2010-12-26 NOTE — ED Notes (Signed)
Pt presents with right sided neck pain that radiates into right ear. Pt states she has been coughing a lot this week ad that is what made the pain start.

## 2010-12-27 ENCOUNTER — Other Ambulatory Visit (HOSPITAL_COMMUNITY): Payer: Self-pay | Admitting: Nephrology

## 2010-12-27 DIAGNOSIS — N186 End stage renal disease: Secondary | ICD-10-CM

## 2010-12-30 ENCOUNTER — Ambulatory Visit (HOSPITAL_COMMUNITY): Payer: Medicare Other

## 2010-12-31 ENCOUNTER — Ambulatory Visit (HOSPITAL_COMMUNITY)
Admission: RE | Admit: 2010-12-31 | Discharge: 2010-12-31 | Disposition: A | Discharge status: Home / Self Care | Payer: Medicare Other | Source: Ambulatory Visit | Attending: Vascular Surgery | Admitting: Vascular Surgery

## 2010-12-31 DIAGNOSIS — Z91041 Radiographic dye allergy status: Secondary | ICD-10-CM | POA: Insufficient documentation

## 2010-12-31 DIAGNOSIS — T82898A Other specified complication of vascular prosthetic devices, implants and grafts, initial encounter: Secondary | ICD-10-CM | POA: Insufficient documentation

## 2010-12-31 DIAGNOSIS — Z5309 Procedure and treatment not carried out because of other contraindication: Secondary | ICD-10-CM | POA: Insufficient documentation

## 2010-12-31 LAB — POCT I-STAT, CHEM 8
Calcium, Ion: 1.06 mmol/L — ABNORMAL LOW (ref 1.12–1.32)
Chloride: 97 mEq/L (ref 96–112)
Glucose, Bld: 191 mg/dL — ABNORMAL HIGH (ref 70–99)
HCT: 31 % — ABNORMAL LOW (ref 36.0–46.0)
Hemoglobin: 10.5 g/dL — ABNORMAL LOW (ref 12.0–15.0)
TCO2: 30 mmol/L (ref 0–100)

## 2010-12-31 LAB — NO BLOOD PRODUCTS

## 2011-01-03 ENCOUNTER — Other Ambulatory Visit (HOSPITAL_COMMUNITY): Payer: Self-pay | Admitting: Nephrology

## 2011-01-03 DIAGNOSIS — R609 Edema, unspecified: Secondary | ICD-10-CM

## 2011-01-06 ENCOUNTER — Other Ambulatory Visit (HOSPITAL_COMMUNITY): Payer: Self-pay | Admitting: Nephrology

## 2011-01-06 ENCOUNTER — Ambulatory Visit (HOSPITAL_COMMUNITY)
Admission: RE | Admit: 2011-01-06 | Discharge: 2011-01-06 | Disposition: A | Discharge status: Home / Self Care | Payer: Medicare Other | Source: Ambulatory Visit | Attending: Nephrology | Admitting: Nephrology

## 2011-01-06 DIAGNOSIS — R609 Edema, unspecified: Secondary | ICD-10-CM

## 2011-01-06 DIAGNOSIS — R220 Localized swelling, mass and lump, head: Secondary | ICD-10-CM | POA: Insufficient documentation

## 2011-01-06 DIAGNOSIS — N186 End stage renal disease: Secondary | ICD-10-CM | POA: Insufficient documentation

## 2011-01-06 DIAGNOSIS — Y832 Surgical operation with anastomosis, bypass or graft as the cause of abnormal reaction of the patient, or of later complication, without mention of misadventure at the time of the procedure: Secondary | ICD-10-CM | POA: Insufficient documentation

## 2011-01-06 DIAGNOSIS — T82898A Other specified complication of vascular prosthetic devices, implants and grafts, initial encounter: Secondary | ICD-10-CM | POA: Insufficient documentation

## 2011-01-06 MED ORDER — IOHEXOL 300 MG/ML  SOLN
150.0000 mL | Freq: Once | INTRAMUSCULAR | Status: AC | PRN
  Administered 2011-01-06: 120 mL via INTRAVENOUS

## 2011-01-24 ENCOUNTER — Other Ambulatory Visit (HOSPITAL_COMMUNITY): Payer: Self-pay | Admitting: Nephrology

## 2011-01-24 DIAGNOSIS — N186 End stage renal disease: Secondary | ICD-10-CM

## 2011-01-27 ENCOUNTER — Ambulatory Visit (HOSPITAL_COMMUNITY)
Admission: RE | Admit: 2011-01-27 | Discharge: 2011-01-27 | Disposition: A | Discharge status: Home / Self Care | Payer: Medicare Other | Source: Ambulatory Visit | Attending: Nephrology | Admitting: Nephrology

## 2011-01-27 DIAGNOSIS — Z452 Encounter for adjustment and management of vascular access device: Secondary | ICD-10-CM | POA: Insufficient documentation

## 2011-01-27 DIAGNOSIS — N186 End stage renal disease: Secondary | ICD-10-CM

## 2011-02-10 ENCOUNTER — Emergency Department (HOSPITAL_COMMUNITY): Payer: Medicare Other

## 2011-02-10 ENCOUNTER — Other Ambulatory Visit: Payer: Self-pay

## 2011-02-10 ENCOUNTER — Inpatient Hospital Stay (HOSPITAL_COMMUNITY)
Admission: EM | Admit: 2011-02-10 | Discharge: 2011-02-14 | DRG: 070 | Disposition: A | Discharge status: Home / Self Care | Payer: Medicare Other | Attending: Internal Medicine | Admitting: Internal Medicine

## 2011-02-10 ENCOUNTER — Encounter (HOSPITAL_COMMUNITY): Payer: Self-pay | Admitting: Oncology

## 2011-02-10 DIAGNOSIS — E119 Type 2 diabetes mellitus without complications: Secondary | ICD-10-CM | POA: Diagnosis present

## 2011-02-10 DIAGNOSIS — R4701 Aphasia: Secondary | ICD-10-CM | POA: Diagnosis present

## 2011-02-10 DIAGNOSIS — G9341 Metabolic encephalopathy: Principal | ICD-10-CM | POA: Diagnosis present

## 2011-02-10 DIAGNOSIS — J449 Chronic obstructive pulmonary disease, unspecified: Secondary | ICD-10-CM | POA: Diagnosis present

## 2011-02-10 DIAGNOSIS — I120 Hypertensive chronic kidney disease with stage 5 chronic kidney disease or end stage renal disease: Secondary | ICD-10-CM | POA: Diagnosis present

## 2011-02-10 DIAGNOSIS — E039 Hypothyroidism, unspecified: Secondary | ICD-10-CM | POA: Diagnosis present

## 2011-02-10 DIAGNOSIS — J4489 Other specified chronic obstructive pulmonary disease: Secondary | ICD-10-CM | POA: Diagnosis present

## 2011-02-10 DIAGNOSIS — Z992 Dependence on renal dialysis: Secondary | ICD-10-CM

## 2011-02-10 DIAGNOSIS — N186 End stage renal disease: Secondary | ICD-10-CM | POA: Diagnosis present

## 2011-02-10 DIAGNOSIS — Z9981 Dependence on supplemental oxygen: Secondary | ICD-10-CM

## 2011-02-10 DIAGNOSIS — R4182 Altered mental status, unspecified: Secondary | ICD-10-CM

## 2011-02-10 LAB — DIFFERENTIAL
Eosinophils Absolute: 0 10*3/uL (ref 0.0–0.7)
Lymphocytes Relative: 24 % (ref 12–46)
Lymphs Abs: 1.5 10*3/uL (ref 0.7–4.0)
Neutrophils Relative %: 69 % (ref 43–77)

## 2011-02-10 LAB — RAPID URINE DRUG SCREEN, HOSP PERFORMED
Amphetamines: NOT DETECTED
Benzodiazepines: NOT DETECTED
Opiates: NOT DETECTED
Tetrahydrocannabinol: NOT DETECTED

## 2011-02-10 LAB — URINALYSIS, ROUTINE W REFLEX MICROSCOPIC
Nitrite: NEGATIVE
Protein, ur: 100 mg/dL — AB
Specific Gravity, Urine: 1.02 (ref 1.005–1.030)
Urobilinogen, UA: 0.2 mg/dL (ref 0.0–1.0)

## 2011-02-10 LAB — GLUCOSE, CAPILLARY: Glucose-Capillary: 223 mg/dL — ABNORMAL HIGH (ref 70–99)

## 2011-02-10 LAB — COMPREHENSIVE METABOLIC PANEL
ALT: 14 U/L (ref 0–35)
AST: 20 U/L (ref 0–37)
Alkaline Phosphatase: 107 U/L (ref 39–117)
CO2: 31 mEq/L (ref 19–32)
GFR calc Af Amer: 12 mL/min — ABNORMAL LOW (ref >60)
Glucose, Bld: 170 mg/dL — ABNORMAL HIGH (ref 70–99)
Potassium: 4.4 mEq/L (ref 3.5–5.1)
Sodium: 138 mEq/L (ref 135–145)
Total Protein: 8.2 g/dL (ref 6.0–8.3)

## 2011-02-10 LAB — CARDIAC PANEL(CRET KIN+CKTOT+MB+TROPI): Total CK: 40 U/L (ref 7–177)

## 2011-02-10 LAB — BLOOD GAS, ARTERIAL
Bicarbonate: 30.3 mEq/L — ABNORMAL HIGH (ref 20.0–24.0)
FIO2: 0.21 %
pCO2 arterial: 47.1 mmHg — ABNORMAL HIGH (ref 35.0–45.0)
pH, Arterial: 7.424 — ABNORMAL HIGH (ref 7.350–7.400)
pO2, Arterial: 46.7 mmHg — ABNORMAL LOW (ref 80.0–100.0)

## 2011-02-10 LAB — CBC
Platelets: 179 10*3/uL (ref 150–400)
RBC: 5.05 MIL/uL (ref 3.87–5.11)
WBC: 6.2 10*3/uL (ref 4.0–10.5)

## 2011-02-10 MED ORDER — LABETALOL HCL 5 MG/ML IV SOLN
20.0000 mg | Freq: Once | INTRAVENOUS | Status: AC
  Administered 2011-02-10: 20 mg via INTRAVENOUS
  Filled 2011-02-10: qty 4

## 2011-02-10 MED ORDER — DOXAZOSIN MESYLATE 8 MG PO TABS
8.0000 mg | ORAL_TABLET | Freq: Every day | ORAL | Status: DC
  Administered 2011-02-10 – 2011-02-13 (×3): 8 mg via ORAL
  Filled 2011-02-10: qty 1
  Filled 2011-02-10 (×2): qty 4
  Filled 2011-02-10: qty 1

## 2011-02-10 MED ORDER — HYDROMORPHONE HCL 1 MG/ML IJ SOLN
1.0000 mg | Freq: Four times a day (QID) | INTRAMUSCULAR | Status: DC | PRN
Start: 2011-02-10 — End: 2011-02-14
  Administered 2011-02-10 – 2011-02-11 (×2): 1 mg via INTRAVENOUS
  Filled 2011-02-10 (×2): qty 1

## 2011-02-10 MED ORDER — NIACIN ER (ANTIHYPERLIPIDEMIC) 500 MG PO TBCR
500.0000 mg | EXTENDED_RELEASE_TABLET | Freq: Every day | ORAL | Status: DC
  Administered 2011-02-10 – 2011-02-13 (×3): 500 mg via ORAL
  Filled 2011-02-10 (×6): qty 1

## 2011-02-10 MED ORDER — INSULIN ASPART PROT & ASPART (70-30 MIX) 100 UNIT/ML ~~LOC~~ SUSP
15.0000 [IU] | Freq: Every day | SUBCUTANEOUS | Status: DC
  Administered 2011-02-10: 15 [IU] via SUBCUTANEOUS

## 2011-02-10 MED ORDER — ENOXAPARIN SODIUM 30 MG/0.3ML ~~LOC~~ SOLN
30.0000 mg | SUBCUTANEOUS | Status: DC
  Administered 2011-02-11 – 2011-02-13 (×3): 30 mg via SUBCUTANEOUS
  Filled 2011-02-10 (×3): qty 0.3

## 2011-02-10 MED ORDER — ALBUTEROL SULFATE (5 MG/ML) 0.5% IN NEBU
5.0000 mg | INHALATION_SOLUTION | Freq: Once | RESPIRATORY_TRACT | Status: AC
  Administered 2011-02-10: 5 mg via RESPIRATORY_TRACT
  Filled 2011-02-10: qty 1

## 2011-02-10 MED ORDER — GLIPIZIDE 5 MG PO TABS
10.0000 mg | ORAL_TABLET | Freq: Two times a day (BID) | ORAL | Status: DC
  Administered 2011-02-11 – 2011-02-14 (×6): 10 mg via ORAL
  Filled 2011-02-10 (×6): qty 2
  Filled 2011-02-10: qty 1

## 2011-02-10 MED ORDER — ALBUTEROL SULFATE HFA 108 (90 BASE) MCG/ACT IN AERS
2.0000 | INHALATION_SPRAY | Freq: Four times a day (QID) | RESPIRATORY_TRACT | Status: DC | PRN
  Filled 2011-02-10: qty 6.7

## 2011-02-10 MED ORDER — LORAZEPAM 2 MG/ML IJ SOLN
1.0000 mg | INTRAMUSCULAR | Status: DC | PRN
  Administered 2011-02-10 – 2011-02-12 (×5): 1 mg via INTRAVENOUS
  Filled 2011-02-10 (×5): qty 1

## 2011-02-10 MED ORDER — SODIUM CHLORIDE 0.9 % IJ SOLN
INTRAMUSCULAR | Status: AC
  Filled 2011-02-10: qty 10

## 2011-02-10 MED ORDER — INSULIN ASPART PROT & ASPART (70-30 MIX) 100 UNIT/ML ~~LOC~~ SUSP
35.0000 [IU] | Freq: Every day | SUBCUTANEOUS | Status: DC
  Filled 2011-02-10: qty 3

## 2011-02-10 MED ORDER — HYDROMORPHONE HCL 1 MG/ML PO LIQD: 1.0000 mg | Freq: Four times a day (QID) | ORAL | Status: DC | PRN

## 2011-02-10 MED ORDER — ALBUTEROL SULFATE HFA 108 (90 BASE) MCG/ACT IN AERS
INHALATION_SPRAY | RESPIRATORY_TRACT | Status: AC
  Filled 2011-02-10: qty 6.7

## 2011-02-10 MED ORDER — ONDANSETRON HCL 4 MG/2ML IJ SOLN
4.0000 mg | Freq: Once | INTRAMUSCULAR | Status: AC
  Administered 2011-02-10: 4 mg via INTRAVENOUS
  Filled 2011-02-10: qty 2

## 2011-02-10 MED ORDER — LEVOTHYROXINE SODIUM 75 MCG PO TABS
75.0000 ug | ORAL_TABLET | Freq: Every day | ORAL | Status: DC
  Administered 2011-02-11 – 2011-02-14 (×5): 75 ug via ORAL
  Filled 2011-02-10 (×4): qty 1

## 2011-02-10 MED ORDER — ENOXAPARIN SODIUM 100 MG/ML ~~LOC~~ SOLN
1.0000 mg/kg | Freq: Once | SUBCUTANEOUS | Status: AC
  Administered 2011-02-10: 80 mg via SUBCUTANEOUS
  Filled 2011-02-10: qty 1

## 2011-02-10 MED ORDER — FOLIC ACID 1 MG PO TABS
1.0000 mg | ORAL_TABLET | Freq: Every day | ORAL | Status: DC
  Administered 2011-02-11 – 2011-02-14 (×4): 1 mg via ORAL
  Filled 2011-02-10 (×4): qty 1

## 2011-02-10 NOTE — ED Notes (Signed)
Pt's son states pt was last seen normal last night at 2300.  Pt's son reports being awakened by his mother at 0800 this morning - states she was in the bathroom and was "making a lot of noise."  Pt's son reports that when he went to check on her that she wasn't speaking understandably and was having difficulty ambulating.  Pt's family report patient is normally able to speak clearly and converse per normal; also is normally able to independently perform her ADLs.

## 2011-02-10 NOTE — ED Notes (Signed)
Dr. [name] re-paged.  Not in the office.

## 2011-02-10 NOTE — ED Notes (Signed)
EDP notified of pt vomiting (once in CT and once on return to ED); orders rec'd.

## 2011-02-10 NOTE — ED Notes (Signed)
Pt pulled iv in left hand out,

## 2011-02-10 NOTE — ED Notes (Signed)
EDP notified of pt's BP of 217/110.

## 2011-02-10 NOTE — ED Notes (Signed)
Transported to radiology 

## 2011-02-10 NOTE — ED Notes (Signed)
Resting quietly in no distress; respirations even, non-labored.

## 2011-02-10 NOTE — Progress Notes (Signed)
Paged Dr. [name] at 18:25regarding clarification on pt's level of care.  No return call from Dr. [name] therefore paged Dr. Renard Matter at 18:40.  Dr. Renard Matter returned call at 18:42.  Informed Dr. Renard Matter that pt was admitted to ICU and Dr. [name] had given telephone orders in the ED for pt to be admitted to telemetry.  Requested to use Novolog 70/30 in place of Humalog 75/25 as ordered per Dr. [name].  Also reported pt's blood pressure, which is currently 193/86 and requested pain medication.  Pt has expressive aphagia and is lethargic but arouseable to voice; she remains restless, pt able to follow some commands and give "yes or no" response to questions.  Family is at the bedside with pt.

## 2011-02-10 NOTE — ED Notes (Signed)
Returned from CT; placed back on monitor.  Lab at bs for blood draw.

## 2011-02-10 NOTE — ED Notes (Signed)
Placed on O2 2L/min per [place]-SpO2 99%.  Resting quietly; respirations even, non-labored. Family members x 2 at bedside.

## 2011-02-10 NOTE — ED Provider Notes (Signed)
History   Scribed for Hilario Quarry, MD, the patient was seen in room APA02/APA02. This chart was scribed by Clarita Crane. This patient's care was started at 10:12AM  CSN: [id number] Arrival date & time: 02/10/2011 10:02 AM  Chief Complaint  Patient presents with  . Altered Mental Status   History provided by: the son. The history is limited by the condition of the patient.  A level 5 Caveat applies due to Altered Mental Status [name] is a 65 y.o. female who presents to the Emergency Department accompanied by her 2 sons complaining of altered mental status with associated aphasia and weakness first observed 2 hours ago. Son states patient has been struggling to speak and ambulate since symptoms first observed. States patient will attempt to communicate but her words are unintelligible. Patient's son states patient was last seen normal 11 hours ago. Per son, patient speaks fluently and coherently at baseline and denies any history of similar symptoms. Patient with h/o diabetes, renal disorder on dialysis, COPD, hypertension, thyroid disorder.  HPI ELEMENTS: Onset: 2 hours ago Duration: persistent since onset  Timing: constant   Context:  as above  Associated symptoms: +aphasia, weakness   PAST MEDICAL HISTORY:  Past Medical History  Diagnosis Date  . Renal disorder   . Dialysis care     M-W-F dialysis at [facility]  . COPD (chronic obstructive pulmonary disease)   . Diabetes mellitus   . Hypertension   . Thyroid disease     PAST SURGICAL HISTORY:  Past Surgical History  Procedure Date  . Cholecystectomy   . Dg av dialysis shunt access exist*r* or     working right HD catheter    MEDICATIONS:  Previous Medications   ALBUTEROL (VENTOLIN HFA) 108 (90 BASE) MCG/ACT INHALER    Inhale 2 puffs into the lungs every 6 (six) hours as needed. For shortness of breath    AMOXICILLIN-CLAVULANATE (AUGMENTIN) 500-125 MG PER TABLET    Take 1 tablet by mouth 2 (two)  times daily. Started 12/24/10 for 10 days    B COMPLEX-C-FOLIC ACID (VOL-CARE RX) 1 MG TABS    Take 1 tablet by mouth daily.     DOXAZOSIN (CARDURA) 8 MG TABLET    Take 8 mg by mouth at bedtime.     FEXOFENADINE-PSEUDOEPHEDRINE (ALLEGRA-D) 60-120 MG PER TABLET    Take 1 tablet by mouth every 12 (twelve) hours.   GLIPIZIDE (GLUCOTROL) 10 MG TABLET    Take 10 mg by mouth 2 (two) times daily before a meal.     GUAIFENESIN-CODEINE (CHERATUSSIN AC) 100-10 MG/5ML SYRUP    Take 5 mLs by mouth 4 (four) times daily as needed. For coughing     INSULIN LISPRO PROTAMINE-INSULIN LISPRO (HUMALOG 75/25) (75-25) 100 UNIT/ML SUSP    Inject 15-35 Units into the skin. Patient takes 35 units every morning and 15 units in the evening    LEVOTHYROXINE (SYNTHROID, LEVOTHROID) 75 MCG TABLET    Take 75 mcg by mouth daily.     NIACIN (NIASPAN) 500 MG CR TABLET    Take 500 mg by mouth at bedtime.       ALLERGIES:  Allergies as of 02/10/2011 - Review Complete 02/10/2011  Allergen Reaction Noted  . Contrast media (iodinated diagnostic agents)  11/20/2010     FAMILY HISTORY:  No family history on file.   SOCIAL HISTORY: History   Social History  . Marital Status: Married    Spouse Name: N/A    Number  of Children: N/A  . Years of Education: N/A   Social History Main Topics  . Smoking status: Current Everyday Smoker -- 0.5 packs/day    Types: Cigarettes  . Smokeless tobacco: None  . Alcohol Use: No  . Drug Use: No  . Sexually Active:    Other Topics Concern  . None   Social History Narrative  . None     Review of Systems  Unable to perform ROS: Mental status change    Physical Exam  BP 217/89  Pulse 106  Temp(Src) 98.9 F (37.2 C) (Oral)  Resp 20  SpO2 100%  Physical Exam  Nursing note and vitals reviewed. Constitutional: She appears well-developed and well-nourished.  HENT:  Head: Normocephalic and atraumatic.  Eyes: Conjunctivae are normal. Pupils are equal, round, and reactive to  light.  Neck: Neck supple.  Cardiovascular: Regular rhythm.  Tachycardia present.  Exam reveals no gallop and no friction rub.   No murmur heard. Pulmonary/Chest: Effort normal. She has no wheezes.       Healing site in right chest where port-a-cath was. Decreased breath sounds bilaterally.   Abdominal: Soft. Bowel sounds are normal. She exhibits no distension. There is no tenderness.  Musculoskeletal: Normal range of motion. She exhibits no edema.       Shunt/graft in right upper arm.   Neurological: No sensory deficit.       Able to move upper extremities against gravity. Speech abnormal. Increased weakness to right sided extremities as compared to left side extremities.   Skin: Skin is warm and dry.       Abscess to right upper arm.   Psychiatric: [name] noncommunicative.    ED Course  CRITICAL CARE Performed by: Hilario Quarry Authorized by: Hilario Quarry Total critical care time: 45 minutes Critical care time was exclusive of separately billable procedures and treating other patients. Critical care was necessary to treat or prevent imminent or life-threatening deterioration of the following conditions: CNS failure or compromise. Critical care was time spent personally by me on the following activities: development of treatment plan with patient or surrogate, discussions with consultants, discussions with primary provider, evaluation of patient's response to treatment, examination of patient, obtaining history from patient or surrogate, ordering and review of laboratory studies, ordering and review of radiographic studies, re-evaluation of patient's condition and review of old charts.    OTHER DATA REVIEWED: Nursing notes, vital signs, and past medical records reviewed. Lab results reviewed and considered Imaging results reviewed and considered  DIAGNOSTIC STUDIES:  LABS / RADIOLOGY: Results for orders placed during the hospital encounter of 02/10/11  GLUCOSE, CAPILLARY       Component Value Range   Glucose-Capillary 155 (*) 70 - 99 (mg/dL)  CBC      Component Value Range   WBC 6.2  4.0 - 10.5 (K/uL)   RBC 5.05  3.87 - 5.11 (MIL/uL)   Hemoglobin 12.8  12.0 - 15.0 (g/dL)   HCT 40.9  81.1 - 91.4 (%)   MCV 85.0  78.0 - 100.0 (fL)   MCH 25.3 (*) 26.0 - 34.0 (pg)   MCHC 29.8 (*) 30.0 - 36.0 (g/dL)   RDW 78.2 (*) 95.6 - 15.5 (%)   Platelets 179  150 - 400 (K/uL)  DIFFERENTIAL      Component Value Range   Neutrophils Relative 69  43 - 77 (%)   Neutro Abs 4.3  1.7 - 7.7 (K/uL)   Lymphocytes Relative 24  12 - 46 (%)  Lymphs Abs 1.5  0.7 - 4.0 (K/uL)   Monocytes Relative 6  3 - 12 (%)   Monocytes Absolute 0.4  0.1 - 1.0 (K/uL)   Eosinophils Relative 1  0 - 5 (%)   Eosinophils Absolute 0.0  0.0 - 0.7 (K/uL)   Basophils Relative 0  0 - 1 (%)   Basophils Absolute 0.0  0.0 - 0.1 (K/uL)  COMPREHENSIVE METABOLIC PANEL      Component Value Range   Sodium 138  135 - 145 (mEq/L)   Potassium 4.4  3.5 - 5.1 (mEq/L)   Chloride 95 (*) 96 - 112 (mEq/L)   CO2 31  19 - 32 (mEq/L)   Glucose, Bld 170 (*) 70 - 99 (mg/dL)   BUN 26 (*) 6 - 23 (mg/dL)   Creatinine, Ser 6.57 (*) 0.50 - 1.10 (mg/dL)   Calcium 9.8  8.4 - 84.6 (mg/dL)   Total Protein 8.2  6.0 - 8.3 (g/dL)   Albumin 3.9  3.5 - 5.2 (g/dL)   AST 20  0 - 37 (U/L)   ALT 14  0 - 35 (U/L)   Alkaline Phosphatase 107  39 - 117 (U/L)   Total Bilirubin 0.4  0.3 - 1.2 (mg/dL)   GFR calc non Af Amer 10 (*) >60 (mL/min)   GFR calc Af Amer 12 (*) >60 (mL/min)  PROTIME-INR      Component Value Range   Prothrombin Time 13.3  11.6 - 15.2 (seconds)   INR 0.99  0.00 - 1.49   APTT      Component Value Range   aPTT 28  24 - 37 (seconds)  URINE RAPID DRUG SCREEN (HOSP PERFORMED)      Component Value Range   Opiates NONE DETECTED  NONE DETECTED    Cocaine NONE DETECTED  NONE DETECTED    Benzodiazepines NONE DETECTED  NONE DETECTED    Amphetamines NONE DETECTED  NONE DETECTED    Tetrahydrocannabinol NONE DETECTED  NONE  DETECTED    Barbiturates NONE DETECTED  NONE DETECTED   ETHANOL      Component Value Range   Alcohol, Ethyl (B) <11  0 - 11 (mg/dL)  CARDIAC PANEL(CRET KIN+CKTOT+MB+TROPI)      Component Value Range   Total CK 40  7 - 177 (U/L)   CK, MB 2.0  0.3 - 4.0 (ng/mL)   Troponin I <0.30  <0.30 (ng/mL)   Relative Index RELATIVE INDEX IS INVALID  0.0 - 2.5   BLOOD GAS, ARTERIAL      Component Value Range   FIO2 0.21     pH, Arterial 7.424 (*) 7.350 - 7.400    pCO2 arterial 47.1 (*) 35.0 - 45.0 (mmHg)   pO2, Arterial 46.7 (*) 80.0 - 100.0 (mmHg)   Bicarbonate 30.3 (*) 20.0 - 24.0 (mEq/L)   TCO2 27.4  0 - 100 (mmol/L)   Acid-Base Excess 5.9 (*) 0.0 - 2.0 (mmol/L)   O2 Saturation 84.3     Patient temperature 37.0     Collection site LEFT BRACHIAL     Drawn by COLLECTED BY RT     Sample type ARTERIAL DRAW     Allens test (pass/fail) PASS  PASS   URINALYSIS, ROUTINE W REFLEX MICROSCOPIC      Component Value Range   Color, Urine YELLOW  YELLOW    Appearance CLEAR  CLEAR    Specific Gravity, Urine 1.020  1.005 - 1.030    pH 8.5 (*) 5.0 - 8.0    Glucose,  UA 100 (*) NEGATIVE (mg/dL)   Hgb urine dipstick SMALL (*) NEGATIVE    Bilirubin Urine NEGATIVE  NEGATIVE    Ketones, ur TRACE (*) NEGATIVE (mg/dL)   Protein, ur 454 (*) NEGATIVE (mg/dL)   Urobilinogen, UA 0.2  0.0 - 1.0 (mg/dL)   Nitrite NEGATIVE  NEGATIVE    Leukocytes, UA NEGATIVE  NEGATIVE   URINE MICROSCOPIC-ADD ON      Component Value Range   RBC / HPF 3-6  <3 (RBC/hpf)  D-DIMER, QUANTITATIVE      Component Value Range   D-Dimer, Quant 1.15 (*) 0.00 - 0.48 (ug/mL-FEU)   Dg Chest 1 View  02/10/2011  *RADIOLOGY REPORT*  Clinical Data: Altered mental status  CHEST - 1 VIEW  Comparison: None.  Findings: Normal mediastinum and cardiac silhouette.  Aorta is calcified.  No effusion, infiltrate, or pneumothorax.  No acute bony abnormality.  IMPRESSION: No acute cardiopulmonary process.  Original Report Authenticated By: Genevive Bi,  M.D.   Ct Head Wo Contrast  02/10/2011  *RADIOLOGY REPORT*  Clinical Data: Altered mental status.  CT HEAD WITHOUT CONTRAST  Technique:  Contiguous axial images were obtained from the base of the skull through the vertex without contrast.  Comparison: None.  Findings: The brain shows mild cortical atrophy.  There is no evidence of infarction, hemorrhage, mass effect, hydrocephalus, extra-axial fluid collection, abnormal edema or mass lesion.  The skull is unremarkable.  IMPRESSION: No acute findings.  Mild atrophy.  Original Report Authenticated By: Reola Calkins, M.D.    PROCEDURES:  ED COURSE / COORDINATION OF CARE: Orders Placed This Encounter  Procedures  . CT Head Wo Contrast  . DG Chest 1 View  . Glucose, capillary  . CBC  . Differential  . Comprehensive metabolic panel  . Protime-INR  . APTT  . Urine rapid drug screen (hosp performed)  . Ethanol  . Cardiac panel(cret kin+cktot+mb+tropi)  . Blood gas, arterial  . Urinalysis with microscopic  . Urine microscopic-add on  . D-dimer, quantitative  . Cardiac monitoring  . Swallow Screen (NPO until completed)  . Insert foley catheter  . Consult to hospitalist  . Consult to internal medicine  . Pulse oximetry, continuous  . Oxygen therapy Keep 02 saturation: greater than 91%; Liters Per Minute: 2; Mode or (Route): Nasal cannula  . ED EKG  2:00PM- Patient's family informed of current lab and imaging results. Explained possible causes being considered but inconclusive at this time. Explained recommendation to admit to hospital. Family agrees with recommendation at this time. Will consult with Internal medicine regarding admission. 3:25PM- Consult complete with Dr. [name]. Patient case explained and discussed. Dr. [name] agrees to admit and evaluate patient.   MDM:  PLAN: Admit  CONDITION ON DISCHARGE: Stable   MEDICATIONS GIVEN IN THE E.D.  Medications  ondansetron (ZOFRAN) injection 4 mg (4 mg Intravenous Given 02/10/11  1205)  labetalol (NORMODYNE,TRANDATE) injection 20 mg (20 mg Intravenous Given 02/10/11 1235)  albuterol (PROVENTIL) (5 MG/ML) 0.5% nebulizer solution 5 mg (5 mg Nebulization Given 02/10/11 1436)      I personally performed the services described in this documentation, which was scribed in my presence. The recorded information has been reviewed and considered. Hilario Quarry, MD      Hilario Quarry, MD 02/10/11 [phone]

## 2011-02-11 ENCOUNTER — Inpatient Hospital Stay (HOSPITAL_COMMUNITY): Payer: Medicare Other

## 2011-02-11 ENCOUNTER — Inpatient Hospital Stay (HOSPITAL_COMMUNITY)
Admit: 2011-02-11 | Discharge: 2011-02-11 | Disposition: A | Discharge status: Home / Self Care | Payer: Medicare Other | Attending: Neurology | Admitting: Neurology

## 2011-02-11 DIAGNOSIS — I059 Rheumatic mitral valve disease, unspecified: Secondary | ICD-10-CM

## 2011-02-11 LAB — GLUCOSE, CAPILLARY
Glucose-Capillary: 89 mg/dL (ref 70–99)
Glucose-Capillary: 96 mg/dL (ref 70–99)

## 2011-02-11 LAB — VITAMIN B12: Vitamin B-12: 1185 pg/mL — ABNORMAL HIGH (ref 211–911)

## 2011-02-11 MED ORDER — INSULIN ASPART 100 UNIT/ML ~~LOC~~ SOLN: 0.0000 [IU] | Freq: Three times a day (TID) | SUBCUTANEOUS | Status: DC

## 2011-02-11 MED ORDER — INSULIN ASPART 100 UNIT/ML ~~LOC~~ SOLN
0.0000 [IU] | Freq: Three times a day (TID) | SUBCUTANEOUS | Status: DC
  Administered 2011-02-12 (×2): 2 [IU] via SUBCUTANEOUS
  Administered 2011-02-12 – 2011-02-13 (×2): 5 [IU] via SUBCUTANEOUS
  Administered 2011-02-13: 2 [IU] via SUBCUTANEOUS
  Administered 2011-02-13: 5 [IU] via SUBCUTANEOUS
  Administered 2011-02-14: 2 [IU] via SUBCUTANEOUS
  Filled 2011-02-11: qty 3

## 2011-02-11 MED ORDER — NEPRO/CARBSTEADY PO LIQD: 237.0000 mL | ORAL | Status: DC | PRN

## 2011-02-11 MED ORDER — DEXTROSE 50 % IV SOLN
INTRAVENOUS | Status: AC
  Administered 2011-02-11: 25 mL
  Filled 2011-02-11: qty 50

## 2011-02-11 MED ORDER — SODIUM CHLORIDE 0.9 % IJ SOLN
INTRAMUSCULAR | Status: AC
  Filled 2011-02-11: qty 20

## 2011-02-11 MED ORDER — HEPARIN SODIUM (PORCINE) 1000 UNIT/ML DIALYSIS
500.0000 [IU]/h | INTRAMUSCULAR | Status: DC | PRN
  Administered 2011-02-11 – 2011-02-14 (×5): 500 [IU]/h via INTRAVENOUS_CENTRAL
  Filled 2011-02-11: qty 1

## 2011-02-11 MED ORDER — SODIUM CHLORIDE 0.9 % IV SOLN: 100.0000 mL | INTRAVENOUS | Status: DC | PRN

## 2011-02-11 MED ORDER — ASPIRIN 325 MG PO TABS
325.0000 mg | ORAL_TABLET | Freq: Every day | ORAL | Status: DC
  Administered 2011-02-11 – 2011-02-14 (×4): 325 mg via ORAL
  Filled 2011-02-11 (×4): qty 1

## 2011-02-11 MED ORDER — INSULIN ASPART 100 UNIT/ML ~~LOC~~ SOLN
0.0000 [IU] | Freq: Every day | SUBCUTANEOUS | Status: DC
  Administered 2011-02-12: 2 [IU] via SUBCUTANEOUS
  Administered 2011-02-13: 3 [IU] via SUBCUTANEOUS

## 2011-02-11 MED ORDER — HEPARIN SODIUM (PORCINE) 1000 UNIT/ML DIALYSIS
2000.0000 [IU] | INTRAMUSCULAR | Status: DC | PRN
  Administered 2011-02-11 – 2011-02-14 (×2): 2000 [IU] via INTRAVENOUS_CENTRAL
  Filled 2011-02-11: qty 2

## 2011-02-11 NOTE — Plan of Care (Signed)
Outpatient plan of care on chart / reviewed  Pt. Ed. Controlling phosphorus provided to family.

## 2011-02-11 NOTE — Consult Note (Signed)
Reason for Consult: End-stage renal disease Referring Physician: Dr. Windell Hummingbird [name] is an 65 y.o. female.  HPI: Patient wheeze her history of end-stage renal disease on maintenance hemodialysis Monday Wednesday Friday presently was brought because of her increased confusion altered mental status change a difficulty in talking. Presently her patient says that she's feeling better. She does have any nausea vomiting she denies any also pain and a difficulty increasing.  Past Medical History  Diagnosis Date  . Renal disorder   . Dialysis care     M-W-F dialysis at [facility]  . COPD (chronic obstructive pulmonary disease)   . Diabetes mellitus   . Hypertension   . Thyroid disease     Past Surgical History  Procedure Date  . Cholecystectomy   . Dg av dialysis shunt access exist*r* or     working right HD catheter    No family history on file.  Social History:  reports that she has been smoking Cigarettes.  She has been smoking about .5 packs per day. She does not have any smokeless tobacco history on file. She reports that she does not drink alcohol or use illicit drugs.  Allergies:  Allergies  Allergen Reactions  . Contrast Media (Iodinated Diagnostic Agents)     Medications: I have reviewed the patient's current medications.  Results for orders placed during the hospital encounter of 02/10/11 (from the past 48 hour(s))  GLUCOSE, CAPILLARY     Status: Abnormal   Collection Time   02/10/11 10:14 AM      Component Value Range Comment   Glucose-Capillary 155 (*) 70 - 99 (mg/dL)   CBC     Status: Abnormal   Collection Time   02/10/11 11:00 AM      Component Value Range Comment   WBC 6.2  4.0 - 10.5 (K/uL)    RBC 5.05  3.87 - 5.11 (MIL/uL)    Hemoglobin 12.8  12.0 - 15.0 (g/dL)    HCT 14.7  82.9 - 56.2 (%)    MCV 85.0  78.0 - 100.0 (fL)    MCH 25.3 (*) 26.0 - 34.0 (pg)    MCHC 29.8 (*) 30.0 - 36.0 (g/dL)    RDW 13.0 (*) 86.5 - 15.5 (%)    Platelets 179   150 - 400 (K/uL)   DIFFERENTIAL     Status: Normal   Collection Time   02/10/11 11:00 AM      Component Value Range Comment   Neutrophils Relative 69  43 - 77 (%)    Neutro Abs 4.3  1.7 - 7.7 (K/uL)    Lymphocytes Relative 24  12 - 46 (%)    Lymphs Abs 1.5  0.7 - 4.0 (K/uL)    Monocytes Relative 6  3 - 12 (%)    Monocytes Absolute 0.4  0.1 - 1.0 (K/uL)    Eosinophils Relative 1  0 - 5 (%)    Eosinophils Absolute 0.0  0.0 - 0.7 (K/uL)    Basophils Relative 0  0 - 1 (%)    Basophils Absolute 0.0  0.0 - 0.1 (K/uL)   PROTIME-INR     Status: Normal   Collection Time   02/10/11 11:00 AM      Component Value Range Comment   Prothrombin Time 13.3  11.6 - 15.2 (seconds)    INR 0.99  0.00 - 1.49    APTT     Status: Normal   Collection Time   02/10/11 11:00 AM  Component Value Range Comment   aPTT 28  24 - 37 (seconds)   ETHANOL     Status: Normal   Collection Time   02/10/11 11:00 AM      Component Value Range Comment   Alcohol, Ethyl (B) <11  0 - 11 (mg/dL)   D-DIMER, QUANTITATIVE     Status: Abnormal   Collection Time   02/10/11 11:00 AM      Component Value Range Comment   D-Dimer, Quant 1.15 (*) 0.00 - 0.48 (ug/mL-FEU)   COMPREHENSIVE METABOLIC PANEL     Status: Abnormal   Collection Time   02/10/11 11:10 AM      Component Value Range Comment   Sodium 138  135 - 145 (mEq/L)    Potassium 4.4  3.5 - 5.1 (mEq/L)    Chloride 95 (*) 96 - 112 (mEq/L)    CO2 31  19 - 32 (mEq/L)    Glucose, Bld 170 (*) 70 - 99 (mg/dL)    BUN 26 (*) 6 - 23 (mg/dL)    Creatinine, Ser 4.09 (*) 0.50 - 1.10 (mg/dL)    Calcium 9.8  8.4 - 10.5 (mg/dL)    Total Protein 8.2  6.0 - 8.3 (g/dL)    Albumin 3.9  3.5 - 5.2 (g/dL)    AST 20  0 - 37 (U/L)    ALT 14  0 - 35 (U/L)    Alkaline Phosphatase 107  39 - 117 (U/L)    Total Bilirubin 0.4  0.3 - 1.2 (mg/dL)    GFR calc non Af Amer 10 (*) >60 (mL/min)    GFR calc Af Amer 12 (*) >60 (mL/min)   CARDIAC PANEL(CRET KIN+CKTOT+MB+TROPI)     Status: Normal    Collection Time   02/10/11 11:10 AM      Component Value Range Comment   Total CK 40  7 - 177 (U/L)    CK, MB 2.0  0.3 - 4.0 (ng/mL)    Troponin I <0.30  <0.30 (ng/mL)    Relative Index RELATIVE INDEX IS INVALID  0.0 - 2.5    URINE RAPID DRUG SCREEN (HOSP PERFORMED)     Status: Normal   Collection Time   02/10/11 11:52 AM      Component Value Range Comment   Opiates NONE DETECTED  NONE DETECTED     Cocaine NONE DETECTED  NONE DETECTED     Benzodiazepines NONE DETECTED  NONE DETECTED     Amphetamines NONE DETECTED  NONE DETECTED     Tetrahydrocannabinol NONE DETECTED  NONE DETECTED     Barbiturates NONE DETECTED  NONE DETECTED    URINALYSIS, ROUTINE W REFLEX MICROSCOPIC     Status: Abnormal   Collection Time   02/10/11 11:52 AM      Component Value Range Comment   Color, Urine YELLOW  YELLOW     Appearance CLEAR  CLEAR     Specific Gravity, Urine 1.020  1.005 - 1.030     pH 8.5 (*) 5.0 - 8.0     Glucose, UA 100 (*) NEGATIVE (mg/dL)    Hgb urine dipstick SMALL (*) NEGATIVE     Bilirubin Urine NEGATIVE  NEGATIVE     Ketones, ur TRACE (*) NEGATIVE (mg/dL)    Protein, ur 811 (*) NEGATIVE (mg/dL)    Urobilinogen, UA 0.2  0.0 - 1.0 (mg/dL)    Nitrite NEGATIVE  NEGATIVE     Leukocytes, UA NEGATIVE  NEGATIVE    URINE MICROSCOPIC-ADD ON  Status: Normal   Collection Time   02/10/11 11:52 AM      Component Value Range Comment   RBC / HPF 3-6  <3 (RBC/hpf)   BLOOD GAS, ARTERIAL     Status: Abnormal   Collection Time   02/10/11 12:35 PM      Component Value Range Comment   FIO2 0.21      pH, Arterial 7.424 (*) 7.350 - 7.400     pCO2 arterial 47.1 (*) 35.0 - 45.0 (mmHg)    pO2, Arterial 46.7 (*) 80.0 - 100.0 (mmHg)    Bicarbonate 30.3 (*) 20.0 - 24.0 (mEq/L)    TCO2 27.4  0 - 100 (mmol/L)    Acid-Base Excess 5.9 (*) 0.0 - 2.0 (mmol/L)    O2 Saturation 84.3      Patient temperature 37.0      Collection site LEFT BRACHIAL      Drawn by COLLECTED BY RT      Sample type ARTERIAL DRAW       Allens test (pass/fail) PASS  PASS    MRSA PCR SCREENING     Status: Normal   Collection Time   02/10/11  5:20 PM      Component Value Range Comment   MRSA by PCR NEGATIVE  NEGATIVE    GLUCOSE, CAPILLARY     Status: Abnormal   Collection Time   02/10/11  6:08 PM      Component Value Range Comment   Glucose-Capillary 223 (*) 70 - 99 (mg/dL)    Comment 1 Notify RN      Comment 2 Documented in Chart     GLUCOSE, CAPILLARY     Status: Normal   Collection Time   02/11/11  7:19 AM      Component Value Range Comment   Glucose-Capillary 96  70 - 99 (mg/dL)    Comment 1 Notify RN      Comment 2 Documented in Chart       Dg Chest 1 View  02/10/2011  *RADIOLOGY REPORT*  Clinical Data: Altered mental status  CHEST - 1 VIEW  Comparison: None.  Findings: Normal mediastinum and cardiac silhouette.  Aorta is calcified.  No effusion, infiltrate, or pneumothorax.  No acute bony abnormality.  IMPRESSION: No acute cardiopulmonary process.  Original Report Authenticated By: Genevive Bi, M.D.   Ct Head Wo Contrast  02/10/2011  *RADIOLOGY REPORT*  Clinical Data: Altered mental status.  CT HEAD WITHOUT CONTRAST  Technique:  Contiguous axial images were obtained from the base of the skull through the vertex without contrast.  Comparison: None.  Findings: The brain shows mild cortical atrophy.  There is no evidence of infarction, hemorrhage, mass effect, hydrocephalus, extra-axial fluid collection, abnormal edema or mass lesion.  The skull is unremarkable.  IMPRESSION: No acute findings.  Mild atrophy.  Original Report Authenticated By: Reola Calkins, M.D.    Review of Systems  Respiratory: Negative for shortness of breath.   Cardiovascular: Negative for chest pain, palpitations, orthopnea and PND.  Gastrointestinal: Negative for nausea and vomiting.  Neurological: Positive for weakness. Negative for seizures.  Psychiatric/Behavioral: Positive for memory loss.   Blood pressure 172/77, pulse 88,  temperature 98.2 F (36.8 C), temperature source Oral, resp. rate 24, height 5\' 3"  (1.6 m), weight 73.1 kg (161 lb 2.5 oz), SpO2 98.00%. Physical Exam  Constitutional: [name] oriented to person, place, and time.  Eyes: No scleral icterus.  Cardiovascular: Normal rate, regular rhythm and normal heart sounds.  Exam reveals  no gallop.   No murmur heard. Respiratory: No respiratory distress. She has no wheezes. She has no rales.  GI: She exhibits no distension.  Musculoskeletal: She exhibits no edema.  Neurological: [name] alert and oriented to person, place, and time.    Assessment/Plan: Problem #1 end-stage renal disease she status post hemodialysis on Wednesday and pending creatinine is stable normal potassium she is due for dialysis today. Problem 2 history of altered mental status etiology as this moment not clear presently she is going to have MRI of the brain as the CT scan didn't show any acute change. Problem #3 history of hypertension her blood pressure slightly high but overall controlled. Problem #4 history of COPD she is on inhaler. Problem #5 history of hypothyroidism she is on Synthroid. Problem #6 history of anemia and compression of iron deficiency and anemia of chronic disease H&H is stable Problem #7 history of peripheral vascular disease Problem #8 history of hyperlipidemia Problem #9 history of diabetes Problem #10 history of degenerative joint disease.  Recommendation we'll make arrangements for patient to get dialysis today. We'll use a 2k/ 2.5 calcium bath and we'll try to get possibly about 2 to 21/2 liters depending on her blood pressure. We'll check her phosphorus level in the morning and we'll adjust a binder. This is end of her consult thank you  [facility] S 02/11/2011, 10:02 AM

## 2011-02-11 NOTE — Progress Notes (Signed)
Patient has a blood sugar of 78  Patient is on hd  Will not be eating for a few more hours.  D50 25mg  thur hd machine.  Will recheck sugar in 15 minutes.

## 2011-02-11 NOTE — H&P (Signed)
NAMECHRISTEEN, LAI               ACCOUNT NO.:  [id number]  MEDICAL RECORD NO.:  [id number]  LOCATION:  IC03                          FACILITY:  [facility]  PHYSICIAN:  Porschia Willbanks D. [name], MD   DATE OF BIRTH:  05-08-1946  DATE OF ADMISSION:  02/10/2011 DATE OF DISCHARGE:  LH                             HISTORY & PHYSICAL   CHIEF COMPLAINT:  Change in mental status, generalized weakness, and aphasia.  HISTORY OF PRESENT ILLNESS:  This is a 65 year old female patient who was brought to the emergency room by her family due to change in mental status, aphasia, and weakness.  The patient is a known case of renal failure on hemodialysis who has been taking her dialysis regularly. According to her son, the patient was in her bathroom and she was found to be weak and confused.  She had difficult to speak clearly.  The patient was brought to the emergency room.  Baseline studies were done which showed sign of renal failure.  Her CT scan of the head was negative.  The etiology of her symptoms were not clear.  The patient had uncontrolled blood pressure which was treated in the emergency room and her blood pressure was controlled.  She was admitted under telemetry for further evaluation.  REVIEW OF SYSTEMS:  The patient is not able to give history at this time.  According to the family, the patient did not complain of headache, fever, chills, cough, chest pain, nausea, or vomiting.  PAST MEDICAL HISTORY: 1. End-stage renal failure, on hemodialysis. 2. Diabetes mellitus. 3. Hypertension. 4. History of thyroid disease. 5. Chronic obstructive pulmonary disease.  CURRENT MEDICATIONS: 1. Albuterol inhaler 2 puffs q.6 h. p.r.n. 2. Multivitamin with folic acid 1 tablet p.o. daily. 3. Allegra 120 mg daily. 4. Glipizide 10 mg daily. 5. Humalog 75/25, 35 units in the morning and 50 units in the p.m. 6. Levothyroxine 75 mcg daily. 7. Niacin 500 mg daily. 8. Cardura 8 mg p.o. daily.  SOCIAL  HISTORY:  The patient smokes about 1/2 pack of cigarettes per day.  She lives with her son.  No history of alcohol or substance abuse.  FAMILY HISTORY:  Not available at this time.  PHYSICAL EXAMINATION:  GENERAL:  The patient is awake, but somewhat confused and disoriented. VITAL SIGNS:  Blood pressure 136/56, pulse 76, respiratory rate 16, and temperature 98.5 degrees Fahrenheit. HEENT:  Pupils are equal and reactive. NECK:  Supple. CHEST:  Decreased air entry, few rhonchi. CARDIOVASCULAR:  First and second heart sounds heard.  No murmur.  No gallop. ABDOMEN:  Soft and lax.  Bowel sound is positive.  No mass or organomegaly. EXTREMITIES:  No leg edema.  LABORATORY DATA ON ADMISSION:  Sodium 138, potassium 4.4, chloride 95, carbon dioxide 31, glucose 140, BUN 26, creatinine 4.4, calcium 9.8, total protein 8.2, albumin 3.9.  CBC:  WBC 6.2, hemoglobin 12.8, hematocrit 42.9, and platelets 179.  CT scan of the head showed no acute finding.  ASSESSMENT: 1. Change in mental status, etiology not clear. 2. Generalized weakness with mild aphasia, could be secondary to     metabolic encephalopathy, however, we will rule out any stroke in  evolution. 3. Chronic obstructive pulmonary disease. 4. Hypertension. 5. End-stage renal failure.  PLAN:  We will continue the patient on telemetry.  We will do carotid Doppler.  We will do an neurology consult.  We will do nephrology consult.  Continue her regular medications.     Siedah Sedor D. [name], MD     TDF/MEDQ  D:  02/11/2011  T:  02/11/2011  Job:  [id number]

## 2011-02-11 NOTE — Progress Notes (Signed)
*  PRELIMINARY RESULTS* Echocardiogram 2D Echocardiogram has been performed.  [name] 02/11/2011, 2:52 PM

## 2011-02-11 NOTE — Progress Notes (Signed)
NAMECARRERA, KIESEL               ACCOUNT NO.:  [id number]  MEDICAL RECORD NO.:  [id number]  LOCATION:  IC03                          FACILITY:  [facility]  PHYSICIAN:  Hokulani Rogel D. [name], MD   DATE OF BIRTH:  03-Sep-1945  DATE OF PROCEDURE:  02/11/2011 DATE OF DISCHARGE:                                PROGRESS NOTE   SUBJECTIVE:  The patient feels slightly better.  Her speech is improving.  However, the patient remained weak and deconditioned.  PHYSICAL EXAMINATION:  GENERAL:  The patient is alert, awake, but sick looking and somewhat confused. VITAL SIGNS:  Blood pressure 136/56, pulse 79, respiratory rate 16, temperature 98.5 degrees Fahrenheit. CHEST:  Decreased air entry, few rhonchi. CARDIOVASCULAR SYSTEM:  First and second heart sounds heard.  No murmur. No gallop. ABDOMEN:  Soft and lax.  Bowel sounds positive.  No mass or organomegaly. EXTREMITIES:  No leg edema.  ASSESSMENT: 1. Change in mental status, etiology not clear. 2. Generalized weakness. 3. End-stage renal failure on hemodialysis. 4. Chronic obstructive pulmonary disease.  PLAN:  We will continue telemetry.  We will continue regular medications.  The patient will have a hemodialysis.  Neurology consult is pending.     Garion Wempe D. [name], MD     TDF/MEDQ  D:  02/11/2011  T:  02/11/2011  Job:  [id number]

## 2011-02-11 NOTE — Progress Notes (Signed)
UR Chart Review Completed  

## 2011-02-11 NOTE — Progress Notes (Signed)
Attempted SLP eval, unable to perform due to patient having dialysis

## 2011-02-11 NOTE — Consult Note (Signed)
Reason for Consult:AMS Referring Physician: KIMM SIDER is an 65 y.o. female.  HPI: The patient is a 65 year old black female who presents with the acute onset of mental status changes on yesterday. The history is obtained from the son. The patient's baseline is that she walks and talks in his generally able to take care of all her ADLs. She does have a lot of chronic illnesses including end-stage kidney disease. However, she is lucid and current baseline. One of her other sons lives with the patient. Apparently, the patient woke up at 8 AM in the morning on yesterday and was not talking at all. The nurse reported the patient complains of headaches today. No focal deficits are reported. No fevers are reported.   Past Medical History  Diagnosis Date  . Renal disorder   . Dialysis care     M-W-F dialysis at [facility]  . COPD (chronic obstructive pulmonary disease)   . Diabetes mellitus   . Hypertension   . Thyroid disease     Past Surgical History  Procedure Date  . Cholecystectomy   . Dg av dialysis shunt access exist*r* or     working right HD catheter    No family history on file.  Social History:  reports that she has been smoking Cigarettes.  She has been smoking about .5 packs per day. She does not have any smokeless tobacco history on file. She reports that she does not drink alcohol or use illicit drugs.  Allergies:  Allergies  Allergen Reactions  . Contrast Media (Iodinated Diagnostic Agents)     Medications:  Prior to Admission medications   Medication Sig Start Date End Date Taking? Authorizing Provider  albuterol (VENTOLIN HFA) 108 (90 BASE) MCG/ACT inhaler Inhale 2 puffs into the lungs every 6 (six) hours as needed. For shortness of breath    Yes Historical Provider, MD  B Complex-C-Folic Acid (VOL-CARE RX) 1 MG TABS Take 1 tablet by mouth daily.     Yes Historical Provider, MD  fexofenadine-pseudoephedrine (ALLEGRA-D) 60-120 MG per tablet  Take 1 tablet by mouth every 12 (twelve) hours. 12/26/10 12/26/11 Yes Kathie Dike, PA  glipiZIDE (GLUCOTROL) 10 MG tablet Take 10 mg by mouth 2 (two) times daily before a meal.     Yes Historical Provider, MD  guaiFENesin-codeine (CHERATUSSIN AC) 100-10 MG/5ML syrup Take 5 mLs by mouth 4 (four) times daily as needed. For coughing     Yes Historical Provider, MD  insulin lispro protamine-insulin lispro (HUMALOG 75/25) (75-25) 100 UNIT/ML SUSP Inject 15-35 Units into the skin. Patient takes 35 units every morning and 15 units in the evening    Yes Historical Provider, MD  levothyroxine (SYNTHROID, LEVOTHROID) 75 MCG tablet Take 75 mcg by mouth daily.     Yes Historical Provider, MD  niacin (NIASPAN) 500 MG CR tablet Take 500 mg by mouth at bedtime.     Yes Historical Provider, MD  amoxicillin-clavulanate (AUGMENTIN) 500-125 MG per tablet Take 1 tablet by mouth 2 (two) times daily. Started 12/24/10 for 10 days     Historical Provider, MD  doxazosin (CARDURA) 8 MG tablet Take 8 mg by mouth at bedtime.      Historical Provider, MD   Scheduled Meds:   . aspirin  325 mg Oral Daily  . doxazosin  8 mg Oral QHS  . enoxaparin  30 mg Subcutaneous Q24H  . folic acid  1 mg Oral Daily  . glipiZIDE  10 mg Oral BID AC  .  insulin aspart  0-5 Units Subcutaneous QHS  . insulin aspart  0-9 Units Subcutaneous TID WC  . labetalol  20 mg Intravenous Once  . levothyroxine  75 mcg Oral QAC breakfast  . niacin  500 mg Oral QHS  . sodium chloride      . sodium chloride      . DISCONTD: insulin aspart  0-9 Units Subcutaneous TID WC & HS  . DISCONTD: insulin aspart protamine-insulin aspart  15 Units Subcutaneous Q supper  . DISCONTD: insulin aspart protamine-insulin aspart  35 Units Subcutaneous Q breakfast   Continuous Infusions:  PRN Meds:.sodium chloride, albuterol, feeding supplement, heparin, heparin, HYDROmorphone, LORazepam, DISCONTD: HYDROmorphone HCl   Results for orders placed during the hospital  encounter of 02/10/11 (from the past 48 hour(s))  GLUCOSE, CAPILLARY     Status: Abnormal   Collection Time   02/10/11 10:14 AM      Component Value Range Comment   Glucose-Capillary 155 (*) 70 - 99 (mg/dL)   CBC     Status: Abnormal   Collection Time   02/10/11 11:00 AM      Component Value Range Comment   WBC 6.2  4.0 - 10.5 (K/uL)    RBC 5.05  3.87 - 5.11 (MIL/uL)    Hemoglobin 12.8  12.0 - 15.0 (g/dL)    HCT 62.1  30.8 - 65.7 (%)    MCV 85.0  78.0 - 100.0 (fL)    MCH 25.3 (*) 26.0 - 34.0 (pg)    MCHC 29.8 (*) 30.0 - 36.0 (g/dL)    RDW 84.6 (*) 96.2 - 15.5 (%)    Platelets 179  150 - 400 (K/uL)   DIFFERENTIAL     Status: Normal   Collection Time   02/10/11 11:00 AM      Component Value Range Comment   Neutrophils Relative 69  43 - 77 (%)    Neutro Abs 4.3  1.7 - 7.7 (K/uL)    Lymphocytes Relative 24  12 - 46 (%)    Lymphs Abs 1.5  0.7 - 4.0 (K/uL)    Monocytes Relative 6  3 - 12 (%)    Monocytes Absolute 0.4  0.1 - 1.0 (K/uL)    Eosinophils Relative 1  0 - 5 (%)    Eosinophils Absolute 0.0  0.0 - 0.7 (K/uL)    Basophils Relative 0  0 - 1 (%)    Basophils Absolute 0.0  0.0 - 0.1 (K/uL)   PROTIME-INR     Status: Normal   Collection Time   02/10/11 11:00 AM      Component Value Range Comment   Prothrombin Time 13.3  11.6 - 15.2 (seconds)    INR 0.99  0.00 - 1.49    APTT     Status: Normal   Collection Time   02/10/11 11:00 AM      Component Value Range Comment   aPTT 28  24 - 37 (seconds)   ETHANOL     Status: Normal   Collection Time   02/10/11 11:00 AM      Component Value Range Comment   Alcohol, Ethyl (B) <11  0 - 11 (mg/dL)   D-DIMER, QUANTITATIVE     Status: Abnormal   Collection Time   02/10/11 11:00 AM      Component Value Range Comment   D-Dimer, Quant 1.15 (*) 0.00 - 0.48 (ug/mL-FEU)   COMPREHENSIVE METABOLIC PANEL     Status: Abnormal   Collection Time   02/10/11 11:10 AM  Component Value Range Comment   Sodium 138  135 - 145 (mEq/L)    Potassium 4.4   3.5 - 5.1 (mEq/L)    Chloride 95 (*) 96 - 112 (mEq/L)    CO2 31  19 - 32 (mEq/L)    Glucose, Bld 170 (*) 70 - 99 (mg/dL)    BUN 26 (*) 6 - 23 (mg/dL)    Creatinine, Ser 8.29 (*) 0.50 - 1.10 (mg/dL)    Calcium 9.8  8.4 - 10.5 (mg/dL)    Total Protein 8.2  6.0 - 8.3 (g/dL)    Albumin 3.9  3.5 - 5.2 (g/dL)    AST 20  0 - 37 (U/L)    ALT 14  0 - 35 (U/L)    Alkaline Phosphatase 107  39 - 117 (U/L)    Total Bilirubin 0.4  0.3 - 1.2 (mg/dL)    GFR calc non Af Amer 10 (*) >60 (mL/min)    GFR calc Af Amer 12 (*) >60 (mL/min)   CARDIAC PANEL(CRET KIN+CKTOT+MB+TROPI)     Status: Normal   Collection Time   02/10/11 11:10 AM      Component Value Range Comment   Total CK 40  7 - 177 (U/L)    CK, MB 2.0  0.3 - 4.0 (ng/mL)    Troponin I <0.30  <0.30 (ng/mL)    Relative Index RELATIVE INDEX IS INVALID  0.0 - 2.5    URINE RAPID DRUG SCREEN (HOSP PERFORMED)     Status: Normal   Collection Time   02/10/11 11:52 AM      Component Value Range Comment   Opiates NONE DETECTED  NONE DETECTED     Cocaine NONE DETECTED  NONE DETECTED     Benzodiazepines NONE DETECTED  NONE DETECTED     Amphetamines NONE DETECTED  NONE DETECTED     Tetrahydrocannabinol NONE DETECTED  NONE DETECTED     Barbiturates NONE DETECTED  NONE DETECTED    URINALYSIS, ROUTINE W REFLEX MICROSCOPIC     Status: Abnormal   Collection Time   02/10/11 11:52 AM      Component Value Range Comment   Color, Urine YELLOW  YELLOW     Appearance CLEAR  CLEAR     Specific Gravity, Urine 1.020  1.005 - 1.030     pH 8.5 (*) 5.0 - 8.0     Glucose, UA 100 (*) NEGATIVE (mg/dL)    Hgb urine dipstick SMALL (*) NEGATIVE     Bilirubin Urine NEGATIVE  NEGATIVE     Ketones, ur TRACE (*) NEGATIVE (mg/dL)    Protein, ur 562 (*) NEGATIVE (mg/dL)    Urobilinogen, UA 0.2  0.0 - 1.0 (mg/dL)    Nitrite NEGATIVE  NEGATIVE     Leukocytes, UA NEGATIVE  NEGATIVE    URINE MICROSCOPIC-ADD ON     Status: Normal   Collection Time   02/10/11 11:52 AM       Component Value Range Comment   RBC / HPF 3-6  <3 (RBC/hpf)   BLOOD GAS, ARTERIAL     Status: Abnormal   Collection Time   02/10/11 12:35 PM      Component Value Range Comment   FIO2 0.21      pH, Arterial 7.424 (*) 7.350 - 7.400     pCO2 arterial 47.1 (*) 35.0 - 45.0 (mmHg)    pO2, Arterial 46.7 (*) 80.0 - 100.0 (mmHg)    Bicarbonate 30.3 (*) 20.0 - 24.0 (mEq/L)    TCO2  27.4  0 - 100 (mmol/L)    Acid-Base Excess 5.9 (*) 0.0 - 2.0 (mmol/L)    O2 Saturation 84.3      Patient temperature 37.0      Collection site LEFT BRACHIAL      Drawn by COLLECTED BY RT      Sample type ARTERIAL DRAW      Allens test (pass/fail) PASS  PASS    MRSA PCR SCREENING     Status: Normal   Collection Time   02/10/11  5:20 PM      Component Value Range Comment   MRSA by PCR NEGATIVE  NEGATIVE    GLUCOSE, CAPILLARY     Status: Abnormal   Collection Time   02/10/11  6:08 PM      Component Value Range Comment   Glucose-Capillary 223 (*) 70 - 99 (mg/dL)    Comment 1 Notify RN      Comment 2 Documented in Chart     GLUCOSE, CAPILLARY     Status: Normal   Collection Time   02/11/11  7:19 AM      Component Value Range Comment   Glucose-Capillary 96  70 - 99 (mg/dL)    Comment 1 Notify RN      Comment 2 Documented in Chart       Dg Chest 1 View  02/10/2011  *RADIOLOGY REPORT*  Clinical Data: Altered mental status  CHEST - 1 VIEW  Comparison: None.  Findings: Normal mediastinum and cardiac silhouette.  Aorta is calcified.  No effusion, infiltrate, or pneumothorax.  No acute bony abnormality.  IMPRESSION: No acute cardiopulmonary process.  Original Report Authenticated By: Genevive Bi, M.D.   Ct Head Wo Contrast  02/10/2011  *RADIOLOGY REPORT*  Clinical Data: Altered mental status.  CT HEAD WITHOUT CONTRAST  Technique:  Contiguous axial images were obtained from the base of the skull through the vertex without contrast.  Comparison: None.  Findings: The brain shows mild cortical atrophy.  There is no  evidence of infarction, hemorrhage, mass effect, hydrocephalus, extra-axial fluid collection, abnormal edema or mass lesion.  The skull is unremarkable.  IMPRESSION: No acute findings.  Mild atrophy.  Original Report Authenticated By: Reola Calkins, M.D.    Review of Systems  Unable to perform ROS  Blood pressure 166/69, pulse 81, temperature 98.9 F (37.2 C), temperature source Oral, resp. rate 19, height 5\' 3"  (1.6 m), weight 73.1 kg (161 lb 2.5 oz), SpO2 95.00%. Physical Exam GENERAL: Obese. [name] in no acute distress.  HEENT: Supple.  ABDOMEN: soft  EXTREMITIES: No edema   BACK: Unremarkable.  SKIN: Normal by inspection.    MENTAL STATUS: The patient is sleeping. Sternal rub is required for awakening. [name] essentially mute and has difficulty comprehending. She does not follow commands.  CRANIAL NERVES: Pupils are equal, round and reactive to light and accomodation; extra ocular movements are full, there is no significant nystagmus; visual fields are full grossly on bedside confrontation direct testing; upper and lower facial muscles are normal in strength and symmetric, there is no flattening of the nasolabial folds; tongue is midline; uvula is midline.  MOTOR: Normal tone, bulk and strength ; no pronator drift.  COORDINATION: Left finger to nose is normal, right finger to nose is normal, No rest tremor; no intention tremor; no postural tremor; no bradykinesia.  REFLEXES: Deep tendon reflexes are symmetrical and normal. Babinski reflexes are equivocal bilaterally.   SENSATION: Normal pain.   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 The patient will be worked up as if she had a stroke. A MRI will be obtained. A echo and carotid Doppler loss of the pain. Lab will be obtained for thyroid function tests, homocysteine level and 11 do to level. Also consider RPR. Also consider lipid profile if not done. The EEG may be done.    Addendum: Results of the  MRI came back and no acute infarcts are seen. 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 The carotid duplex Doppler shows evidence of a thrombosed right internal jugular vein. This was read as an incidental finding possibly from catheter placement. The nurse reports to me that the family seems concerned about this finding. Given that everything else is coming a negative, we will go ahead and obtain a MRV to evaluate for cerebral venous thrombosis. EEG will also be obtained.  [name] 02/11/2011, 8:03 AM

## 2011-02-12 LAB — BASIC METABOLIC PANEL
BUN: 15 mg/dL (ref 6–23)
CO2: 29 mEq/L (ref 19–32)
Chloride: 91 mEq/L — ABNORMAL LOW (ref 96–112)
GFR calc Af Amer: 17 mL/min — ABNORMAL LOW (ref >60)
Potassium: 4.3 mEq/L (ref 3.5–5.1)

## 2011-02-12 LAB — CBC
HCT: 42.1 % (ref 36.0–46.0)
Hemoglobin: 12 g/dL (ref 12.0–15.0)
MCHC: 28.5 g/dL — ABNORMAL LOW (ref 30.0–36.0)
MCV: 85.2 fL (ref 78.0–100.0)

## 2011-02-12 NOTE — Progress Notes (Signed)
Subjective: Interval History: has no complaint of nausea or vomiting. She denies also any her shortness of breath. Overall her patient isn't that she is feeling better. Her yesterday she doesn't remember what is going on but today  she said  everything seems to be her clear .Marland Kitchen  Objective: Vital signs in last 24 hours: Temp:  [97.9 F (36.6 C)-99.6 F (37.6 C)] 98.3 F (36.8 C) (09/15 0800) Pulse Rate:  [84-100] 100  (09/15 0900) Resp:  [14-27] 23  (09/15 0800) BP: (85-176)/(49-126) 136/73 mmHg (09/15 0800) SpO2:  [95 %-100 %] 99 % (09/15 0900) Weight:  [69.3 kg (152 lb 12.5 oz)-74.8 kg (164 lb 14.5 oz)] 152 lb 12.5 oz (69.3 kg) (09/15 0400) Weight change: -3.672 kg (-8 lb 1.5 oz)  Intake/Output from previous day: 09/14 0701 - 09/15 0700 In: 60 [P.O.:60] Out: 2850 [Urine:350] Intake/Output this shift: Total I/O In: 240 [P.O.:240] Out: 1 [Stool:1]  General appearance: alert Resp: clear to auscultation bilaterally Cardio: regular rate and rhythm, S1, S2 normal, no murmur, click, rub or gallop Extremities: extremities normal, atraumatic, no cyanosis or edema  Lab Results:  Basename 02/12/11 0028 02/10/11 1100  WBC 5.6 6.2  HGB 12.0 12.8  HCT 42.1 42.9  PLT 194 179   BMET:  Basename 02/12/11 0028 02/10/11 1110  NA 135 138  K 4.3 4.4  CL 91* 95*  CO2 29 31  GLUCOSE 145* 170*  BUN 15 26*  CREATININE 3.29* 4.41*  CALCIUM 9.0 9.8   No results found for this basename: PTH:2 in the last 72 hours Iron Studies: No results found for this basename: IRON,TIBC,TRANSFERRIN,FERRITIN in the last 72 hours  Studies/Results: Dg Chest 1 View  02/10/2011  *RADIOLOGY REPORT*  Clinical Data: Altered mental status  CHEST - 1 VIEW  Comparison: None.  Findings: Normal mediastinum and cardiac silhouette.  Aorta is calcified.  No effusion, infiltrate, or pneumothorax.  No acute bony abnormality.  IMPRESSION: No acute cardiopulmonary process.  Original Report Authenticated By: Genevive Bi,  M.D.   Ct Head Wo Contrast  02/10/2011  *RADIOLOGY REPORT*  Clinical Data: Altered mental status.  CT HEAD WITHOUT CONTRAST  Technique:  Contiguous axial images were obtained from the base of the skull through the vertex without contrast.  Comparison: None.  Findings: The brain shows mild cortical atrophy.  There is no evidence of infarction, hemorrhage, mass effect, hydrocephalus, extra-axial fluid collection, abnormal edema or mass lesion.  The skull is unremarkable.  IMPRESSION: No acute findings.  Mild atrophy.  Original Report Authenticated By: Reola Calkins, M.D.   Mr Brain Wo Contrast  02/11/2011  *RADIOLOGY REPORT*  Clinical Data: Slurred speech and altered mental status for past day.  Dialysis.  MRI HEAD WITHOUT CONTRAST  Technique:  Multiplanar, multiecho pulse sequences of the brain and surrounding structures were obtained according to standard protocol without intravenous contrast.  Comparison: 02/10/2011 [facility].  No comparison MR.  Findings: No acute infarct.  No intracranial hemorrhage.  No intracranial mass lesion detected on this unenhanced motion degraded exam.  Major intracranial vascular structures are patent.  Mild to moderate small vessel disease type changes.  No hydrocephalus.  Exophthalmos.  IMPRESSION: No acute infarct.  Small vessel disease type changes.  Original Report Authenticated By: Fuller Canada, M.D.   Mr [place]  02/11/2011  *RADIOLOGY REPORT*  Clinical Data: 65 year old female with headaches, altered mental status.  MR HEAD VENOGRAM  Comparison: Brain MRI without contrast from the same day.  Findings: Study  is moderately degraded by motion artifact despite repeated imaging attempts.  Grossly preserved flow signal throughout the superior sagittal sinus.  Grossly preserved flow signal in the straight sinus and internal cerebral veins.  Grossly preserved flow signal in the basal veins of [place].  Grossly preserved flow signal throughout the  torcula, bilateral transverse sinuses, bilateral sigmoid sinuses, and internal jugular bulbs.  Small occipital sinus is present, anatomic variant.  IMPRESSION: Motion degraded exam but no convincing evidence of dural venous thrombosis.  Original Report Authenticated By: Harley Hallmark, M.D.   US Carotid Duplex Bilateral  02/11/2011  *RADIOLOGY REPORT*  Clinical Data: Possible cerebral infarction, hypertension, syncope, visual disturbance, diabetes and history of end-stage renal disease with prior dialysis catheter and graft placement.  BILATERAL CAROTID DUPLEX ULTRASOUND  Technique: Wallace Cullens scale imaging, color Doppler and duplex ultrasound was performed of bilateral carotid and vertebral arteries in the neck.  Comparison:  06/02/2005  Criteria:  Quantification of carotid stenosis is based on velocity parameters that correlate the residual internal carotid diameter with NASCET-based stenosis levels, using the diameter of the distal internal carotid lumen as the denominator for stenosis measurement.  The following velocity measurements were obtained:                   PEAK SYSTOLIC/END DIASTOLIC RIGHT ICA:                        74/9cm/sec CCA:                        70/8cm/sec SYSTOLIC ICA/CCA RATIO:     1.1 DIASTOLIC ICA/CCA RATIO:    1.1 ECA:                        155cm/sec  LEFT ICA:                        57/14cm/sec CCA:                        58/19cm/sec SYSTOLIC ICA/CCA RATIO:     0.98 DIASTOLIC ICA/CCA RATIO:    0.74 ECA:                        61cm/sec  Findings:  RIGHT CAROTID ARTERY: Mild to moderate amount of partially calcified plaque is noted at the level of the carotid bulb and extending up towards the ICA origin.  Estimated ICA stenosis is less than 50%.  RIGHT VERTEBRAL ARTERY:  Antegrade flow with normal wave form.  LEFT CAROTID ARTERY: Mild amount calcified plaque at the level of the carotid bulb and proximal ICA.  Estimated left ICA stenosis is less than 50%.  LEFT VERTEBRAL ARTERY:  Antegrade  flow with normal wave form.  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  Also again noted are thyroid nodules which are more prominent in the left lobe and have been previously characterized by thyroid ultrasound.  IMPRESSION:  1.  No significant carotid stenosis identified with estimated bilateral ICA stenoses of less than 50%.  Mild to moderate amount of plaque is present bilaterally. 2.  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 3.  Multinodular thyroid goiter with largest nodules in the left lobe again noted.  These are not fully characterized on today's study.  Original Report Authenticated By: Reola Calkins, M.D.    I have reviewed the patient's current medications.  Assessment/Plan: Her problem #1 end-stage renal disease she status post hemodialysis yesterday pending creatinine and potassium seems to be with a reasonable range. Problem #2 history of her altered mental status etiology as this moment not clear patient seems to be however improving her. Problem #3 history of anemia this is thought to be secondary to end-stage renal disease her her H&H has this moment seems to be high and the patient's not on Epogen. Problem #4 history of hypertension blood pressure seems to be controlled very well. Problem #5 history of her diabetes Problem #6 history of her multinodular goiter . Her Problem #7 history of her peripheral vascular disease Problem #8 history of her restless  leg syndrome and. Her Recommendation we'll check her basic metabolic panel in the morning also her phosphorus and CBC I will make  another arrangements for dialysis on Monday which is her regular schedule.   LOS: 2 days   Anas Reister S 02/12/2011,10:51 AM

## 2011-02-12 NOTE — Progress Notes (Signed)
Report given to Berneice Heinrich, RN she verbalizes understand and voices no further complaints or concerns at this time.  The pt was transferred via w/c with chart and medications in stable condition to room 330.

## 2011-02-13 LAB — BASIC METABOLIC PANEL
CO2: 30 mEq/L (ref 19–32)
Glucose, Bld: 159 mg/dL — ABNORMAL HIGH (ref 70–99)
Potassium: 4.2 mEq/L (ref 3.5–5.1)
Sodium: 129 mEq/L — ABNORMAL LOW (ref 135–145)

## 2011-02-13 LAB — CBC
Hemoglobin: 12 g/dL (ref 12.0–15.0)
MCH: 24.7 pg — ABNORMAL LOW (ref 26.0–34.0)
MCV: 82.7 fL (ref 78.0–100.0)
RBC: 4.86 MIL/uL (ref 3.87–5.11)

## 2011-02-13 MED ORDER — SODIUM CHLORIDE 0.9 % IV SOLN: 100.0000 mL | INTRAVENOUS | Status: DC | PRN

## 2011-02-13 NOTE — Progress Notes (Signed)
Subjective: Interval History: has no complaint of shortness of breath orthopnea or paroxysmal nocturnal dyspnea. Patient her as this moment also her her says that her appetite is good and there she doesn't have any nausea no vomiting  patient seems to be more alert and oriented..  Objective: Vital signs in last 24 hours: Temp:  [97.8 F (36.6 C)-98.5 F (36.9 C)] 97.8 F (36.6 C) (09/16 0615) Pulse Rate:  [75-100] 82  (09/16 0615) Resp:  [16] 16  (09/16 0615) BP: (110-148)/(70-79) 137/74 mmHg (09/16 0615) SpO2:  [91 %-99 %] 94 % (09/16 0615) Weight change:   Intake/Output from previous day: 09/15 0701 - 09/16 0700 In: 680 [P.O.:680] Out: 26 [Urine:25; Stool:1] Intake/Output this shift:    General appearance: alert Resp: clear to auscultation bilaterally Cardio: regular rate and rhythm, S1, S2 normal, no murmur, click, rub or gallop GI: soft, non-tender; bowel sounds normal; no masses,  no organomegaly Extremities: extremities normal, atraumatic, no cyanosis or edema  Lab Results:  [facility] 02/13/11 0716 02/12/11 0028  WBC 4.8 5.6  HGB 12.0 12.0  HCT 40.2 42.1  PLT 193 194   BMET:  Basename 02/13/11 0716 02/12/11 0028  NA 129* 135  K 4.2 4.3  CL 88* 91*  CO2 30 29  GLUCOSE 159* 145*  BUN 41* 15  CREATININE 6.33* 3.29*  CALCIUM 8.6 9.0   No results found for this basename: PTH:2 in the last 72 hours Iron Studies: No results found for this basename: IRON,TIBC,TRANSFERRIN,FERRITIN in the last 72 hours  Studies/Results: Mr Brain Wo Contrast  02/11/2011  *RADIOLOGY REPORT*  Clinical Data: Slurred speech and altered mental status for past day.  Dialysis.  MRI HEAD WITHOUT CONTRAST  Technique:  Multiplanar, multiecho pulse sequences of the brain and surrounding structures were obtained according to standard protocol without intravenous contrast.  Comparison: 02/10/2011 [facility].  No comparison MR.  Findings: No acute infarct.  No intracranial hemorrhage.  No  intracranial mass lesion detected on this unenhanced motion degraded exam.  Major intracranial vascular structures are patent.  Mild to moderate small vessel disease type changes.  No hydrocephalus.  Exophthalmos.  IMPRESSION: No acute infarct.  Small vessel disease type changes.  Original Report Authenticated By: Fuller Canada, M.D.   Mr [place]  02/11/2011  *RADIOLOGY REPORT*  Clinical Data: 65 year old female with headaches, altered mental status.  MR HEAD VENOGRAM  Comparison: Brain MRI without contrast from the same day.  Findings: Study is moderately degraded by motion artifact despite repeated imaging attempts.  Grossly preserved flow signal throughout the superior sagittal sinus.  Grossly preserved flow signal in the straight sinus and internal cerebral veins.  Grossly preserved flow signal in the basal veins of Rosenthal.  Grossly preserved flow signal throughout the torcula, bilateral transverse sinuses, bilateral sigmoid sinuses, and internal jugular bulbs.  Small occipital sinus is present, anatomic variant.  IMPRESSION: Motion degraded exam but no convincing evidence of dural venous thrombosis.  Original Report Authenticated By: Harley Hallmark, M.D.   US Carotid Duplex Bilateral  02/11/2011  *RADIOLOGY REPORT*  Clinical Data: Possible cerebral infarction, hypertension, syncope, visual disturbance, diabetes and history of end-stage renal disease with prior dialysis catheter and graft placement.  BILATERAL CAROTID DUPLEX ULTRASOUND  Technique: Wallace Cullens scale imaging, color Doppler and duplex ultrasound was performed of bilateral carotid and vertebral arteries in the neck.  Comparison:  06/02/2005  Criteria:  Quantification of carotid stenosis is based on velocity parameters that correlate the residual internal carotid diameter  with NASCET-based stenosis levels, using the diameter of the distal internal carotid lumen as the denominator for stenosis measurement.  The following velocity  measurements were obtained:                   PEAK SYSTOLIC/END DIASTOLIC RIGHT ICA:                        74/9cm/sec CCA:                        70/8cm/sec SYSTOLIC ICA/CCA RATIO:     1.1 DIASTOLIC ICA/CCA RATIO:    1.1 ECA:                        155cm/sec  LEFT ICA:                        57/14cm/sec CCA:                        58/19cm/sec SYSTOLIC ICA/CCA RATIO:     0.98 DIASTOLIC ICA/CCA RATIO:    0.74 ECA:                        61cm/sec  Findings:  RIGHT CAROTID ARTERY: Mild to moderate amount of partially calcified plaque is noted at the level of the carotid bulb and extending up towards the ICA origin.  Estimated ICA stenosis is less than 50%.  RIGHT VERTEBRAL ARTERY:  Antegrade flow with normal wave form.  LEFT CAROTID ARTERY: Mild amount calcified plaque at the level of the carotid bulb and proximal ICA.  Estimated left ICA stenosis is less than 50%.  LEFT VERTEBRAL ARTERY:  Antegrade flow with normal wave form.  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  Also again noted are thyroid nodules which are more prominent in the left lobe and have been previously characterized by thyroid ultrasound.  IMPRESSION:  1.  No significant carotid stenosis identified with estimated bilateral ICA stenoses of less than 50%.  Mild to moderate amount of plaque is present bilaterally. 2.  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 3.  Multinodular thyroid goiter with largest nodules in the left lobe again noted.  These are not fully characterized on today'[name] study.  Original Report Authenticated By: Reola Calkins, M.D.    I have reviewed the patient'[name] current medications.  Assessment/Plan: Problem #1 end-stage renal disease she status post hemodialysis on Friday pending creatinine is was in acceptable range she has normal potassium. Problem #2 history of  diabetes she is on hypoglycemic agent Problem #3 her history of anemia this is secondary to chronic renal failure she is on Epogen H&H is stable.  Problem #4 history of hypertension blood pressure seems to control very well Problem #5 history of her altered mental status at this moment seems to be acute patient has this moment alert oriented and. Her Problem #6 history of her peripheral vascular disease Problem #7 history of her hyperlipidemia. Problem #8 history of arthritis. Recommendation we'll make arrangements for patient to get dialysis tomorrow we'll check her basic metabolic panel and CBC. We'll continue his other medications. And if her patient is going to be discharged she'll go to her regular unit as  outpatient.    LOS: 3 days   [name] 02/13/2011,8:51 AM

## 2011-02-13 NOTE — Progress Notes (Unsigned)
[name], [name]               ACCOUNT NO.:  [id number]  MEDICAL RECORD NO.:  [id number]  LOCATION:                                 FACILITY:  PHYSICIAN:  Evalette Montrose D. [name], MD   DATE OF BIRTH:  [birth year]  DATE OF PROCEDURE:  02/12/2011 DATE OF DISCHARGE:                                PROGRESS NOTE   SUBJECTIVE:  The patient is somewhat better.  However, she remained confused and disoriented.  OBJECTIVE:  GENERAL:  The patient is alert, awake, confused and disoriented. VITAL SIGNS:  Blood pressure 85/56, pulse 84, respiratory rate 14, temperature 97.5 degrees Fahrenheit. CHEST:  Decreased air entry.  Few rhonchi. CARDIOVASCULAR SYSTEM:  First and second heart sounds heard.  No murmur. No gallop. ABDOMEN:  Soft and lax.  Bowel sound is positive.  No mass or organomegaly.  EXTREMITIES:  No leg edema.  MRA of the brain showed no significant findings.  ASSESSMENT: 1. Change in mental status, etiology not clear. 2. End-stage renal failure on hemodialysis. 3. Chronic obstructive pulmonary disease. 4. Hypertension.  PLAN:  We will continue the patient on neuro check.  Continue current treatment according to Nephrology and Neurology recommendation. Continue supportive care.     [place] D. [name], MD     TDF/MEDQ  D:  02/12/2011  T:  02/12/2011  Job:  [id number]

## 2011-02-14 ENCOUNTER — Inpatient Hospital Stay (HOSPITAL_COMMUNITY): Payer: Medicare Other

## 2011-02-14 LAB — GLUCOSE, CAPILLARY
Glucose-Capillary: 165 mg/dL — ABNORMAL HIGH (ref 70–99)
Glucose-Capillary: 294 mg/dL — ABNORMAL HIGH (ref 70–99)
Glucose-Capillary: 220 mg/dL — ABNORMAL HIGH (ref 70–99)

## 2011-02-14 NOTE — Progress Notes (Signed)
Outpatient plan of care on chart, reviewed  Pt. Ed. controliing fluid intake provided

## 2011-02-14 NOTE — Progress Notes (Signed)
Speech Language/Pathology  SLP attempted to see patient for a third attempt and pt in dialysis. Tameka, RN reports that pt has been consuming food, liquids, pills without incident. SLP spoke with pt's son who also confirms that his mother is having no difficulty swallowing. He did voice concerns about possible thrombosed right internal jugular vein. He was encouraged to discuss with Dr. [name] (relayed this to RN as well). Pt is set for discharge later today per RN. Please reconsult SLP if swallow evaluation is indicated. Thank you, Havery Moros, CCC-SLP

## 2011-02-14 NOTE — Progress Notes (Signed)
Pt ready to be discharged discharge instructions given and pt and son verbalized understanding.  The pt left the floor via w/c with staff and family in stable condition.

## 2011-02-14 NOTE — Progress Notes (Signed)
[name], [name]               ACCOUNT NO.:  [id number]  MEDICAL RECORD NO.:  [id number]  LOCATION:                                 FACILITY:  PHYSICIAN:  Kyrene Longan D. [name], MD   DATE OF BIRTH:  [birth year]  DATE OF PROCEDURE:  02/13/2011 DATE OF DISCHARGE:                                PROGRESS NOTE   SUBJECTIVE:  The patient feels much better.  [name] able to ambulate. Her memory and mental status are improving.  OBJECTIVE:  GENERAL:  The patient is more alert, awake, and communicating well.  Not in any formal distress.  Her mental status has improved. VITAL SIGNS:  Blood pressure 110/70, pulse 75, respiratory rate 16, temperature 97 degrees Fahrenheit. CHEST:  Clear lung fields, good air entry. CARDIOVASCULAR:  First and second heart sounds heard.  No murmur.  No gallop. ABDOMEN:  Soft and lax.  Bowel sounds positive.  No mass or organomegaly. EXTREMITIES:  No leg edema.  ASSESSMENT: 1. Change in mental status, etiology unclear. 2. End-stage renal failure, on hemodialysis. 3. Hypertension.  PLAN:  Continue the patient on neuro checks.  Continue current medications.  We will continue hemodialysis and supportive care.     Emmons Toth D. [name], MD     TDF/MEDQ  D:  02/13/2011  T:  02/13/2011  Job:  [id number]

## 2011-02-14 NOTE — Procedures (Signed)
[name], [name]               ACCOUNT NO.:  [id number]  MEDICAL RECORD NO.:  [id number]  LOCATION:  EE                           FACILITY:  [facility]  PHYSICIAN:  Juliane Guest A. Gerilyn Pilgrim, M.D. DATE OF BIRTH:  1945-08-17  DATE OF PROCEDURE:  02/11/2011 DATE OF DISCHARGE:  02/11/2011                             EEG INTERPRETATION   INDICATIONS:  This is a 66 year old who presents with confusion, drowsiness, and altered mentation.  This study has been done to evaluate for nonconvulsive seizures.  MEDICATIONS:  Augmentin, folic acid, Cardura, Allegra-D, Glucotrol, Robitussin, insulin, Synthroid, and niacin.  ANALYSIS:  A 16-channel recording is conducted for 20 minutes.  There is posterior low-voltage electrocortical activity of 15 Hz intermixed with the outflow background activity of 9 Hz.  Sleep activity is observed. Stage II sleep with K-complexes and sleep spindles are recorded.  Photic stimulation is carried out without abnormal changes in the background activity.  Hyperventilation is not carried out.  There is no focal or lateralized slowing.  There is no epileptiform activity observed.  IMPRESSION:  Unremarkable recording of awake and sleep states.     Alaynna Kerwood A. Gerilyn Pilgrim, M.D.     KAD/MEDQ  D:  02/14/2011  T:  02/14/2011  Job:  [id number]

## 2011-02-14 NOTE — Progress Notes (Signed)
Subjective: Interval History: has no complaint of nausea or vomiting . presently patient seems to be feeling better. She denies any cough fever or sweating. She denies also any her shortness of breath..  Objective: Vital signs in last 24 hours: Temp:  [97.3 F (36.3 C)-98 F (36.7 C)] 98 F (36.7 C) (09/17 0547) Pulse Rate:  [82-92] 82  (09/17 0547) Resp:  [16-20] 16  (09/17 0547) BP: (133-164)/(73-84) 133/73 mmHg (09/17 0547) SpO2:  [93 %-98 %] 94 % (09/17 0547) Weight change:   Intake/Output from previous day: 09/16 0701 - 09/17 0700 In: 720 [P.O.:720] Out: -  Intake/Output this shift:    General appearance: alert Resp: clear to auscultation bilaterally Chest wall: no tenderness GI: soft, non-tender; bowel sounds normal; no masses,  no organomegaly Extremities: extremities normal, atraumatic, no cyanosis or edema  Lab Results:  [facility] 02/13/11 0716 02/12/11 0028  WBC 4.8 5.6  HGB 12.0 12.0  HCT 40.2 42.1  PLT 193 194   BMET:  Basename 02/13/11 0716 02/12/11 0028  NA 129* 135  K 4.2 4.3  CL 88* 91*  CO2 30 29  GLUCOSE 159* 145*  BUN 41* 15  CREATININE 6.33* 3.29*  CALCIUM 8.6 9.0   No results found for this basename: PTH:2 in the last 72 hours Iron Studies: No results found for this basename: IRON,TIBC,TRANSFERRIN,FERRITIN in the last 72 hours  Studies/Results: No results found.  I have reviewed the patient's current medications.  Assessment/Plan: Problem #1 end-stage renal disease patient is status post hemodialysis on Friday pending creatinine was in acceptable range normal potassium. Problem #2 history of hypertension her blood pressure seems to be controlled very well. Problem #3 history of her anemia her hemoglobin is the stable she is off the Epogen and. Problem #4 history of altered mental status etiology as this moment her her is not clear but patient overall has improved.  Her problem #5 history of her degenerative joint disease. Problem #6  history of peripheral vascular disease. Number problem #7 history of her bronchitis has this moment patient doesn't have any cough and no fever and her white blood cell count is normal. Recommendation we'll do hemodialysis today and I if patient is discharged should go for her next dialysis as an outpatient her to her regular unit. His end of her followup thank you to   LOS: 4 days   Jayr Lupercio S 02/14/2011,8:12 AM

## 2011-02-14 NOTE — Discharge Summary (Signed)
NAMECIPRIANA, BILLER               ACCOUNT NO.:  [id number]  MEDICAL RECORD NO.:  [id number]  LOCATION:  A330                          FACILITY:  [facility]  PHYSICIAN:  Zyria Fiscus D. [name], MD   DATE OF BIRTH:  [birth year]  DATE OF ADMISSION:  02/10/2011 DATE OF DISCHARGE:  09/17/2012LH                              DISCHARGE SUMMARY   DISCHARGE DIAGNOSES: 1. Change in mental status, etiology unclear. 2. End-stage renal failure on hemodialysis. 3. Chronic obstructive pulmonary disease. 4. Hypertension. 5. Diabetes mellitus. 6. Hypothyroidism.  MEDICATIONS: 1. Cardura 8 mg p.o. daily. 2. Allegra 120 mg p.o. b.i.d. 3. Glyburide 10 mg b.i.d. 4. Humalog insulin 75/25, 35 units in morning and 50 units at bedtime. 5. Levothyroxine 75 mg daily. 6. Niacin 50 mg daily. 7. Albuterol inhaler 2 puffs q.6 p.r.n. 8. Vol-Care Rx 1 mg p.o. daily.  DISPOSITION:  The patient will be discharged home in stable condition.  DISCHARGE INSTRUCTIONS:  The patient will be followed in the office in 1 week.  The patient will continue her maintenance hemodialysis as per her regular schedule.  LABS ON DISCHARGE:  Sodium 129, potassium 4.2, chloride 88, carbon dioxide 30, glucose of 59, BUN 41, creatinine 6.33, calcium 8.6.  CBC: WBC 4.8, hemoglobin 12.0, hematocrit 40.2, and platelets 193,000.  HOSPITAL COURSE:  This is a 65 year old female patient with history of multiple medical illnesses who was admitted due to change in mental status.  She had also difficulty in speaking and generalized weakness. Her CT scan of the head was negative.  The patient was admitted under telemetry and neuro check was done.  Neurology consult was initiated.  MRI of the brain was done after admission.  Her MRI was negative for any acute changes.  Over the hospital stay, the patient gradually improved.  She got her hemodialysis.  At this time the patient is back to her baseline.  She will be discharged to home in stable condition  to be followed in the office in 1 week duration.     Sarahmarie Leavey D. [name], MD     TDF/MEDQ  D:  02/14/2011  T:  02/14/2011  Job:  [id number]

## 2011-02-14 NOTE — Progress Notes (Signed)
Faxed results of this am ABG and intervention to Dr. Juanetta Gosling office.  Verified with Becky at the office that the results were receive.

## 2011-02-14 NOTE — Progress Notes (Signed)
Subjective: Interval History:  It appears that the patient has improved significantly since she was last seen. [name] up sitting in her breakfast. She's had a rather extensive workup by the workup has been unrevealing. She has been afebrile. MRI of the brain showed no acute infarct as initially suspected. She presented with marked drowsiness and what appears to be a globally aphasia. Echocardiography showed ejection fraction of 65% with mild left ventricular hypertrophy. No thrombus seen. Carotid duplex Doppler shows thrombosis of the right IJ but no hemodynamic stenosis of the carotid artery. The patient did go on to have a MRV because of the suspicion of the right IJ causing her symptoms. The MRV however did not show any intracranial venous thrombosis.  Objective: Vital signs in last 24 hours: Temp:  [97.3 F (36.3 C)-98 F (36.7 C)] 98 F (36.7 C) (09/17 0547) Pulse Rate:  [82-92] 82  (09/17 0547) Resp:  [16-20] 16  (09/17 0547) BP: (133-164)/(73-84) 133/73 mmHg (09/17 0547) SpO2:  [93 %-98 %] 94 % (09/17 0547)  Intake/Output from previous day: 09/16 0701 - 09/17 0700 In: 720 [P.O.:720] Out: -  Intake/Output this shift:   Nutritional status: Carb Control  PE GENERAL: [name] in no acute distress. [name] sitting up eating breakfast.  HEENT: Supple. Atraumatic normocephalic.   ABDOMEN: soft  EXTREMITIES: No edema   BACK: Normal.  SKIN: Normal by inspection.    MENTAL STATUS: Alert and oriented to time (to the year and month) although she recorded a little prompting. Speech and language are good. She knows she is in the hospital and states that she guess that she is in [place].   CRANIAL NERVES: Pupils are equal, round and reactive to light and accomodation; extra ocular movements are full, there is no significant nystagmus; visual fields are full; upper and lower facial muscles are normal in strength and symmetric, there is no flattening of the nasolabial folds; tongue is  midline; uvula is midline; shoulder elevation is normal.  MOTOR: Normal tone, bulk and strength; no pronator drift.  COORDINATION: Left finger to nose is normal, right finger to nose is normal, No rest tremor; no intention tremor; no postural tremor; no bradykinesia.  REFLEXES: Deep tendon reflexes are symmetrical and normal.   Lab Results:  Basename 02/13/11 0716 02/12/11 0028  WBC 4.8 5.6  HGB 12.0 12.0  HCT 40.2 42.1  PLT 193 194  NA 129* 135  K 4.2 4.3  CL 88* 91*  CO2 30 29  GLUCOSE 159* 145*  BUN 41* 15  CREATININE 6.33* 3.29*  CALCIUM 8.6 9.0  LABA1C -- --   Lipid Panel No results found for this basename: CHOL,TRIG,HDL,CHOLHDL,VLDL,LDLCALC in the last 72 hours  Studies/Results: No results found.  Medications:  Scheduled Meds:   . aspirin  325 mg Oral Daily  . doxazosin  8 mg Oral QHS  . enoxaparin  30 mg Subcutaneous Q24H  . folic acid  1 mg Oral Daily  . glipiZIDE  10 mg Oral BID AC  . insulin aspart  0-5 Units Subcutaneous QHS  . insulin aspart  0-9 Units Subcutaneous TID WC  . levothyroxine  75 mcg Oral QAC breakfast  . niacin  500 mg Oral QHS   Continuous Infusions:  PRN Meds:.sodium chloride, albuterol, feeding supplement, heparin, heparin, HYDROmorphone, LORazepam, DISCONTD: sodium chloride   Assessment/Plan: Altered mental status/acute encephalopathy of unclear etiology. She has improved spontaneously resolve possibility that this could have been a seizure given the negative imaging of the brain.  EEG be normal does not rule out seizures however. Note metabolic or toxic etiologies uncovered. Continue with current care.   LOS: 4 days   Diora Bellizzi

## 2011-02-21 LAB — IRON AND TIBC
Iron: 28 — ABNORMAL LOW
TIBC: 270
UIBC: 242

## 2011-02-21 LAB — FERRITIN: Ferritin: 66 (ref 10–291)

## 2011-02-21 LAB — POCT HEMOGLOBIN-HEMACUE: Hemoglobin: 11.9 — ABNORMAL LOW

## 2011-02-22 LAB — CBC
HCT: 44.5
Hemoglobin: 14.5
Platelets: 188
Platelets: 192
RBC: 6.17 — ABNORMAL HIGH
WBC: 5.8
WBC: 8.2

## 2011-02-24 LAB — CBC
HCT: 35.6 — ABNORMAL LOW
MCV: 71.7 — ABNORMAL LOW
RBC: 4.96
WBC: 5.8

## 2011-02-24 NOTE — ED Provider Notes (Signed)
Addendum to my ed note of 02/10/2011 I personally performed the services described in this documentation, which was scribed in my presence. The recorded information has been reviewed and considered. Holli Humbles, MD 02/24/11 [phone]

## 2011-02-25 LAB — HEMOGLOBIN AND HEMATOCRIT, BLOOD: Hemoglobin: 11.9 — ABNORMAL LOW

## 2011-02-28 LAB — POCT HEMOGLOBIN-HEMACUE: Hemoglobin: 11.1 — ABNORMAL LOW

## 2011-03-02 LAB — IRON AND TIBC
Iron: 91
UIBC: 178

## 2011-03-08 LAB — BASIC METABOLIC PANEL
CO2: 29
Calcium: 8.7
Chloride: 103
Chloride: 104
Creatinine, Ser: 1.85 — ABNORMAL HIGH
GFR calc Af Amer: 26 — ABNORMAL LOW
GFR calc Af Amer: 28 — ABNORMAL LOW
GFR calc Af Amer: 34 — ABNORMAL LOW
GFR calc non Af Amer: 22 — ABNORMAL LOW
GFR calc non Af Amer: 23 — ABNORMAL LOW
GFR calc non Af Amer: 28 — ABNORMAL LOW
Potassium: 3.7
Potassium: 3.8
Sodium: 135

## 2011-03-08 LAB — URINE MICROSCOPIC-ADD ON

## 2011-03-08 LAB — DIFFERENTIAL
Basophils Relative: 1
Eosinophils Relative: 1
Lymphocytes Relative: 33
Monocytes Relative: 5
Neutro Abs: 3.7

## 2011-03-08 LAB — URINALYSIS, ROUTINE W REFLEX MICROSCOPIC
Nitrite: NEGATIVE
Protein, ur: 100 — AB
Specific Gravity, Urine: 1.016
Urobilinogen, UA: 0.2

## 2011-03-08 LAB — CARDIAC PANEL(CRET KIN+CKTOT+MB+TROPI)
CK, MB: 1.4
Relative Index: INVALID
Total CK: 77

## 2011-03-08 LAB — B-NATRIURETIC PEPTIDE (CONVERTED LAB): Pro B Natriuretic peptide (BNP): 252 — ABNORMAL HIGH

## 2011-03-08 LAB — HEMOGLOBIN A1C
Hgb A1c MFr Bld: 10.8 — ABNORMAL HIGH
Mean Plasma Glucose: 307

## 2011-03-08 LAB — CBC
Hemoglobin: 11.6 — ABNORMAL LOW
MCHC: 32.5
RBC: 5.1

## 2011-03-08 LAB — PROTEIN ELECTROPH W RFLX QUANT IMMUNOGLOBULINS
Alpha-2-Globulin: 15.6 — ABNORMAL HIGH
M-Spike, %: NOT DETECTED
Total Protein ELP: 6.4

## 2011-03-08 LAB — CK TOTAL AND CKMB (NOT AT ARMC): CK, MB: 1.4

## 2011-03-08 LAB — POCT CARDIAC MARKERS: Myoglobin, poc: 330

## 2011-03-10 ENCOUNTER — Emergency Department (HOSPITAL_COMMUNITY): Payer: Medicare Other

## 2011-03-10 ENCOUNTER — Emergency Department (HOSPITAL_COMMUNITY)
Admit: 2011-03-10 | Discharge: 2011-03-10 | Disposition: A | Discharge status: Home / Self Care | Payer: Medicare Other | Attending: Vascular Surgery | Admitting: Vascular Surgery

## 2011-03-10 ENCOUNTER — Emergency Department (HOSPITAL_COMMUNITY)
Admission: EM | Admit: 2011-03-10 | Discharge: 2011-03-10 | Disposition: A | Discharge status: Home / Self Care | Payer: Medicare Other | Source: Home / Self Care | Attending: Emergency Medicine | Admitting: Emergency Medicine

## 2011-03-10 ENCOUNTER — Ambulatory Visit (HOSPITAL_COMMUNITY)
Admission: RE | Admit: 2011-03-10 | Discharge: 2011-03-10 | Disposition: A | Discharge status: Nursing Home (Includes ICF) | Payer: Medicare Other | Source: Ambulatory Visit | Attending: Vascular Surgery | Admitting: Vascular Surgery

## 2011-03-10 DIAGNOSIS — Z794 Long term (current) use of insulin: Secondary | ICD-10-CM | POA: Insufficient documentation

## 2011-03-10 DIAGNOSIS — N186 End stage renal disease: Secondary | ICD-10-CM | POA: Insufficient documentation

## 2011-03-10 DIAGNOSIS — E039 Hypothyroidism, unspecified: Secondary | ICD-10-CM | POA: Insufficient documentation

## 2011-03-10 DIAGNOSIS — I120 Hypertensive chronic kidney disease with stage 5 chronic kidney disease or end stage renal disease: Secondary | ICD-10-CM | POA: Insufficient documentation

## 2011-03-10 DIAGNOSIS — E875 Hyperkalemia: Secondary | ICD-10-CM | POA: Insufficient documentation

## 2011-03-10 DIAGNOSIS — R110 Nausea: Secondary | ICD-10-CM | POA: Insufficient documentation

## 2011-03-10 DIAGNOSIS — E669 Obesity, unspecified: Secondary | ICD-10-CM | POA: Insufficient documentation

## 2011-03-10 DIAGNOSIS — R197 Diarrhea, unspecified: Secondary | ICD-10-CM | POA: Insufficient documentation

## 2011-03-10 DIAGNOSIS — Z992 Dependence on renal dialysis: Secondary | ICD-10-CM | POA: Insufficient documentation

## 2011-03-10 DIAGNOSIS — E119 Type 2 diabetes mellitus without complications: Secondary | ICD-10-CM | POA: Insufficient documentation

## 2011-03-10 DIAGNOSIS — N189 Chronic kidney disease, unspecified: Secondary | ICD-10-CM | POA: Insufficient documentation

## 2011-03-10 LAB — POCT I-STAT, CHEM 8
BUN: 59 mg/dL — ABNORMAL HIGH (ref 6–23)
BUN: 58 mg/dL — ABNORMAL HIGH (ref 6–23)
Calcium, Ion: 1.1 mmol/L — ABNORMAL LOW (ref 1.12–1.32)
Calcium, Ion: 1.11 mmol/L — ABNORMAL LOW (ref 1.12–1.32)
Chloride: 105 mEq/L (ref 96–112)
Chloride: 105 mEq/L (ref 96–112)
Creatinine, Ser: 7.9 mg/dL — ABNORMAL HIGH (ref 0.50–1.10)
Glucose, Bld: 247 mg/dL — ABNORMAL HIGH (ref 70–99)
HCT: 46 % (ref 36.0–46.0)
HCT: 43 % (ref 36.0–46.0)
Hemoglobin: 14.6 g/dL (ref 12.0–15.0)
Potassium: 6.6 mEq/L (ref 3.5–5.1)
Potassium: 5.7 mEq/L — ABNORMAL HIGH (ref 3.5–5.1)
Sodium: 137 mEq/L (ref 135–145)
TCO2: 25 mmol/L (ref 0–100)

## 2011-03-10 LAB — GLUCOSE, CAPILLARY: Glucose-Capillary: 342 mg/dL — ABNORMAL HIGH (ref 70–99)

## 2011-03-10 LAB — COMPREHENSIVE METABOLIC PANEL
Alkaline Phosphatase: 99 U/L (ref 39–117)
BUN: 60 mg/dL — ABNORMAL HIGH (ref 6–23)
CO2: 25 mEq/L (ref 19–32)
Chloride: 98 mEq/L (ref 96–112)
Creatinine, Ser: 7.51 mg/dL — ABNORMAL HIGH (ref 0.50–1.10)
GFR calc non Af Amer: 5 mL/min — ABNORMAL LOW (ref >90)
Total Bilirubin: 0.2 mg/dL — ABNORMAL LOW (ref 0.3–1.2)

## 2011-03-10 LAB — CBC
HCT: 39.4 % (ref 36.0–46.0)
Hemoglobin: 12.4 g/dL (ref 12.0–15.0)
MCV: 80.9 fL (ref 78.0–100.0)
RBC: 4.87 MIL/uL (ref 3.87–5.11)
WBC: 4.8 10*3/uL (ref 4.0–10.5)

## 2011-03-10 LAB — URINALYSIS, ROUTINE W REFLEX MICROSCOPIC
Ketones, ur: NEGATIVE mg/dL
Leukocytes, UA: NEGATIVE
Protein, ur: >300 mg/dL — AB
Urobilinogen, UA: 0.2 mg/dL (ref 0.0–1.0)

## 2011-03-10 LAB — URINE MICROSCOPIC-ADD ON

## 2011-03-11 ENCOUNTER — Ambulatory Visit (HOSPITAL_COMMUNITY)
Admission: RE | Admit: 2011-03-11 | Discharge: 2011-03-11 | Disposition: A | Discharge status: Home / Self Care | Payer: Medicare Other | Source: Ambulatory Visit | Attending: Vascular Surgery | Admitting: Vascular Surgery

## 2011-03-11 ENCOUNTER — Ambulatory Visit (HOSPITAL_COMMUNITY): Payer: Medicare Other

## 2011-03-11 DIAGNOSIS — N186 End stage renal disease: Secondary | ICD-10-CM

## 2011-03-11 DIAGNOSIS — Z452 Encounter for adjustment and management of vascular access device: Secondary | ICD-10-CM | POA: Insufficient documentation

## 2011-03-11 DIAGNOSIS — Z992 Dependence on renal dialysis: Secondary | ICD-10-CM | POA: Insufficient documentation

## 2011-03-11 DIAGNOSIS — I120 Hypertensive chronic kidney disease with stage 5 chronic kidney disease or end stage renal disease: Secondary | ICD-10-CM | POA: Insufficient documentation

## 2011-03-11 LAB — POCT I-STAT, CHEM 8
Calcium, Ion: 1.08 mmol/L — ABNORMAL LOW (ref 1.12–1.32)
HCT: 45 % (ref 36.0–46.0)
Hemoglobin: 15.3 g/dL — ABNORMAL HIGH (ref 12.0–15.0)
Sodium: 140 mEq/L (ref 135–145)
TCO2: 27 mmol/L (ref 0–100)

## 2011-03-11 LAB — GLUCOSE, CAPILLARY
Glucose-Capillary: 98 mg/dL (ref 70–99)
Glucose-Capillary: 70 mg/dL (ref 70–99)
Glucose-Capillary: 114 mg/dL — ABNORMAL HIGH (ref 70–99)

## 2011-03-14 LAB — BASIC METABOLIC PANEL
BUN: 27 — ABNORMAL HIGH
CO2: 26
Calcium: 8.8
GFR calc non Af Amer: 37 — ABNORMAL LOW
Glucose, Bld: 84
Sodium: 141

## 2011-03-14 LAB — POCT I-STAT, CHEM 8
BUN: 85 mg/dL — ABNORMAL HIGH (ref 6–23)
Calcium, Ion: 1.08 mmol/L — ABNORMAL LOW (ref 1.12–1.32)
Chloride: 104 mEq/L (ref 96–112)
HCT: 46 % (ref 36.0–46.0)
Potassium: 8.1 mEq/L (ref 3.5–5.1)

## 2011-03-14 LAB — CBC
Hemoglobin: 10.1 — ABNORMAL LOW
MCHC: 32.3
Platelets: 221
RDW: 16.8 — ABNORMAL HIGH

## 2011-03-21 NOTE — Op Note (Signed)
  [name], [name]               ACCOUNT NO.:  [id number]  MEDICAL RECORD NO.:  [id number]  LOCATION:  [facility]                         FACILITY:  [facility]  PHYSICIAN:  Quita Skye. Hart Rochester, M.D.  DATE OF BIRTH:  24-Dec-1945  DATE OF PROCEDURE:  03/11/2011 DATE OF DISCHARGE:  03/11/2011                              OPERATIVE REPORT   PREOPERATIVE DIAGNOSIS:  End-stage renal disease.  POSTOPERATIVE DIAGNOSIS:  End-stage renal disease.  OPERATION: 1. Removal of temporary hemodialysis catheter, right internal jugular     vein. 2. Insertion of a new Diatek hemodialysis cuffed catheter via right     internal jugular vein.  SURGEON:  Quita Skye. Hart Rochester, MD  FIRST ASSISTANT:  Nurse.  ANESTHESIA:  Local.  PROCEDURE:  The patient was taken to the operating room, placed in the supine position, at which time the upper chest and neck were exposed. There was a temporary noncuffed catheter in the right internal jugular vein, which had been placed 24 hours earlier.  The catheter was prepped and draped into the field.  After removal of the sutures, the catheter was clamped with a hemostat and transected and a guidewire passed through the central port into the right atrium under fluoroscopic guidance.  The catheter was removed over the guidewire, the tract redilated, and a new 19-cm Diatek hemodialysis catheter was passed through a peel-away sheath, positioned in the right atrium, tunneled peripherally, and secured with nylon sutures.  The wound was then closed with Vicryl in a subcuticular fashion.  Sterile dressing applied, and the patient taken to recovery room in stable condition.     Quita Skye Hart Rochester, M.D.     JDL/MEDQ  D:  03/11/2011  T:  03/11/2011  Job:  [id number]  Electronically Signed by Josephina Gip M.D. on 03/21/2011 10:08:25 AM

## 2011-03-29 ENCOUNTER — Encounter: Payer: Self-pay | Admitting: Vascular Surgery

## 2011-03-30 ENCOUNTER — Encounter: Payer: Self-pay | Admitting: Vascular Surgery

## 2011-03-31 ENCOUNTER — Ambulatory Visit: Payer: Medicare Other | Admitting: Vascular Surgery

## 2011-05-10 ENCOUNTER — Ambulatory Visit: Payer: Self-pay | Admitting: Vascular Surgery

## 2011-05-18 ENCOUNTER — Ambulatory Visit: Payer: Self-pay | Admitting: Vascular Surgery

## 2011-06-01 DIAGNOSIS — N2581 Secondary hyperparathyroidism of renal origin: Secondary | ICD-10-CM | POA: Diagnosis not present

## 2011-06-01 DIAGNOSIS — N186 End stage renal disease: Secondary | ICD-10-CM | POA: Diagnosis not present

## 2011-06-01 DIAGNOSIS — D631 Anemia in chronic kidney disease: Secondary | ICD-10-CM | POA: Diagnosis not present

## 2011-06-09 DIAGNOSIS — N186 End stage renal disease: Secondary | ICD-10-CM | POA: Diagnosis not present

## 2011-06-09 DIAGNOSIS — E119 Type 2 diabetes mellitus without complications: Secondary | ICD-10-CM | POA: Diagnosis not present

## 2011-06-09 DIAGNOSIS — I10 Essential (primary) hypertension: Secondary | ICD-10-CM | POA: Diagnosis not present

## 2011-06-09 DIAGNOSIS — T82898A Other specified complication of vascular prosthetic devices, implants and grafts, initial encounter: Secondary | ICD-10-CM | POA: Diagnosis not present

## 2011-06-16 ENCOUNTER — Ambulatory Visit: Payer: Self-pay | Admitting: Vascular Surgery

## 2011-06-16 DIAGNOSIS — E785 Hyperlipidemia, unspecified: Secondary | ICD-10-CM | POA: Diagnosis not present

## 2011-06-16 DIAGNOSIS — T82598A Other mechanical complication of other cardiac and vascular devices and implants, initial encounter: Secondary | ICD-10-CM | POA: Diagnosis not present

## 2011-06-16 DIAGNOSIS — Z9889 Other specified postprocedural states: Secondary | ICD-10-CM | POA: Diagnosis not present

## 2011-06-16 DIAGNOSIS — I129 Hypertensive chronic kidney disease with stage 1 through stage 4 chronic kidney disease, or unspecified chronic kidney disease: Secondary | ICD-10-CM | POA: Diagnosis not present

## 2011-06-16 DIAGNOSIS — E119 Type 2 diabetes mellitus without complications: Secondary | ICD-10-CM | POA: Diagnosis not present

## 2011-06-16 DIAGNOSIS — N186 End stage renal disease: Secondary | ICD-10-CM | POA: Diagnosis not present

## 2011-06-16 LAB — POTASSIUM: Potassium: 4.6 mmol/L (ref 3.5–5.1)

## 2011-06-16 LAB — PROTIME-INR: INR: 0.9

## 2011-06-21 ENCOUNTER — Ambulatory Visit: Payer: Medicare Other | Admitting: Gastroenterology

## 2011-06-22 DIAGNOSIS — E1129 Type 2 diabetes mellitus with other diabetic kidney complication: Secondary | ICD-10-CM | POA: Diagnosis not present

## 2011-06-30 DIAGNOSIS — N186 End stage renal disease: Secondary | ICD-10-CM | POA: Diagnosis not present

## 2011-07-01 DIAGNOSIS — N186 End stage renal disease: Secondary | ICD-10-CM | POA: Diagnosis not present

## 2011-07-01 DIAGNOSIS — D509 Iron deficiency anemia, unspecified: Secondary | ICD-10-CM | POA: Diagnosis not present

## 2011-07-01 DIAGNOSIS — D631 Anemia in chronic kidney disease: Secondary | ICD-10-CM | POA: Diagnosis not present

## 2011-07-01 DIAGNOSIS — N039 Chronic nephritic syndrome with unspecified morphologic changes: Secondary | ICD-10-CM | POA: Diagnosis not present

## 2011-07-01 DIAGNOSIS — N2581 Secondary hyperparathyroidism of renal origin: Secondary | ICD-10-CM | POA: Diagnosis not present

## 2011-07-01 DIAGNOSIS — Z23 Encounter for immunization: Secondary | ICD-10-CM | POA: Diagnosis not present

## 2011-07-04 DIAGNOSIS — Z23 Encounter for immunization: Secondary | ICD-10-CM | POA: Diagnosis not present

## 2011-07-04 DIAGNOSIS — D509 Iron deficiency anemia, unspecified: Secondary | ICD-10-CM | POA: Diagnosis not present

## 2011-07-04 DIAGNOSIS — D631 Anemia in chronic kidney disease: Secondary | ICD-10-CM | POA: Diagnosis not present

## 2011-07-04 DIAGNOSIS — N2581 Secondary hyperparathyroidism of renal origin: Secondary | ICD-10-CM | POA: Diagnosis not present

## 2011-07-04 DIAGNOSIS — N186 End stage renal disease: Secondary | ICD-10-CM | POA: Diagnosis not present

## 2011-07-06 DIAGNOSIS — D509 Iron deficiency anemia, unspecified: Secondary | ICD-10-CM | POA: Diagnosis not present

## 2011-07-06 DIAGNOSIS — N039 Chronic nephritic syndrome with unspecified morphologic changes: Secondary | ICD-10-CM | POA: Diagnosis not present

## 2011-07-06 DIAGNOSIS — N2581 Secondary hyperparathyroidism of renal origin: Secondary | ICD-10-CM | POA: Diagnosis not present

## 2011-07-06 DIAGNOSIS — N186 End stage renal disease: Secondary | ICD-10-CM | POA: Diagnosis not present

## 2011-07-06 DIAGNOSIS — Z23 Encounter for immunization: Secondary | ICD-10-CM | POA: Diagnosis not present

## 2011-07-08 DIAGNOSIS — N186 End stage renal disease: Secondary | ICD-10-CM | POA: Diagnosis not present

## 2011-07-08 DIAGNOSIS — D509 Iron deficiency anemia, unspecified: Secondary | ICD-10-CM | POA: Diagnosis not present

## 2011-07-08 DIAGNOSIS — Z23 Encounter for immunization: Secondary | ICD-10-CM | POA: Diagnosis not present

## 2011-07-08 DIAGNOSIS — N2581 Secondary hyperparathyroidism of renal origin: Secondary | ICD-10-CM | POA: Diagnosis not present

## 2011-07-08 DIAGNOSIS — N039 Chronic nephritic syndrome with unspecified morphologic changes: Secondary | ICD-10-CM | POA: Diagnosis not present

## 2011-07-11 DIAGNOSIS — N039 Chronic nephritic syndrome with unspecified morphologic changes: Secondary | ICD-10-CM | POA: Diagnosis not present

## 2011-07-11 DIAGNOSIS — N2581 Secondary hyperparathyroidism of renal origin: Secondary | ICD-10-CM | POA: Diagnosis not present

## 2011-07-11 DIAGNOSIS — D509 Iron deficiency anemia, unspecified: Secondary | ICD-10-CM | POA: Diagnosis not present

## 2011-07-11 DIAGNOSIS — Z23 Encounter for immunization: Secondary | ICD-10-CM | POA: Diagnosis not present

## 2011-07-11 DIAGNOSIS — N186 End stage renal disease: Secondary | ICD-10-CM | POA: Diagnosis not present

## 2011-07-13 DIAGNOSIS — H524 Presbyopia: Secondary | ICD-10-CM | POA: Diagnosis not present

## 2011-07-13 DIAGNOSIS — Z23 Encounter for immunization: Secondary | ICD-10-CM | POA: Diagnosis not present

## 2011-07-13 DIAGNOSIS — H35349 Macular cyst, hole, or pseudohole, unspecified eye: Secondary | ICD-10-CM | POA: Diagnosis not present

## 2011-07-13 DIAGNOSIS — N186 End stage renal disease: Secondary | ICD-10-CM | POA: Diagnosis not present

## 2011-07-13 DIAGNOSIS — D631 Anemia in chronic kidney disease: Secondary | ICD-10-CM | POA: Diagnosis not present

## 2011-07-13 DIAGNOSIS — E119 Type 2 diabetes mellitus without complications: Secondary | ICD-10-CM | POA: Diagnosis not present

## 2011-07-13 DIAGNOSIS — N2581 Secondary hyperparathyroidism of renal origin: Secondary | ICD-10-CM | POA: Diagnosis not present

## 2011-07-13 DIAGNOSIS — E039 Hypothyroidism, unspecified: Secondary | ICD-10-CM | POA: Diagnosis not present

## 2011-07-13 DIAGNOSIS — D509 Iron deficiency anemia, unspecified: Secondary | ICD-10-CM | POA: Diagnosis not present

## 2011-07-15 DIAGNOSIS — N2581 Secondary hyperparathyroidism of renal origin: Secondary | ICD-10-CM | POA: Diagnosis not present

## 2011-07-15 DIAGNOSIS — Z23 Encounter for immunization: Secondary | ICD-10-CM | POA: Diagnosis not present

## 2011-07-15 DIAGNOSIS — D509 Iron deficiency anemia, unspecified: Secondary | ICD-10-CM | POA: Diagnosis not present

## 2011-07-15 DIAGNOSIS — N186 End stage renal disease: Secondary | ICD-10-CM | POA: Diagnosis not present

## 2011-07-15 DIAGNOSIS — D631 Anemia in chronic kidney disease: Secondary | ICD-10-CM | POA: Diagnosis not present

## 2011-07-18 DIAGNOSIS — N186 End stage renal disease: Secondary | ICD-10-CM | POA: Diagnosis not present

## 2011-07-18 DIAGNOSIS — D631 Anemia in chronic kidney disease: Secondary | ICD-10-CM | POA: Diagnosis not present

## 2011-07-18 DIAGNOSIS — Z23 Encounter for immunization: Secondary | ICD-10-CM | POA: Diagnosis not present

## 2011-07-18 DIAGNOSIS — N2581 Secondary hyperparathyroidism of renal origin: Secondary | ICD-10-CM | POA: Diagnosis not present

## 2011-07-18 DIAGNOSIS — D509 Iron deficiency anemia, unspecified: Secondary | ICD-10-CM | POA: Diagnosis not present

## 2011-07-20 DIAGNOSIS — N039 Chronic nephritic syndrome with unspecified morphologic changes: Secondary | ICD-10-CM | POA: Diagnosis not present

## 2011-07-20 DIAGNOSIS — N2581 Secondary hyperparathyroidism of renal origin: Secondary | ICD-10-CM | POA: Diagnosis not present

## 2011-07-20 DIAGNOSIS — Z7721 Contact with and (suspected) exposure to potentially hazardous body fluids: Secondary | ICD-10-CM | POA: Diagnosis not present

## 2011-07-20 DIAGNOSIS — Z23 Encounter for immunization: Secondary | ICD-10-CM | POA: Diagnosis not present

## 2011-07-20 DIAGNOSIS — N186 End stage renal disease: Secondary | ICD-10-CM | POA: Diagnosis not present

## 2011-07-20 DIAGNOSIS — D509 Iron deficiency anemia, unspecified: Secondary | ICD-10-CM | POA: Diagnosis not present

## 2011-07-22 DIAGNOSIS — Z23 Encounter for immunization: Secondary | ICD-10-CM | POA: Diagnosis not present

## 2011-07-22 DIAGNOSIS — D509 Iron deficiency anemia, unspecified: Secondary | ICD-10-CM | POA: Diagnosis not present

## 2011-07-22 DIAGNOSIS — N186 End stage renal disease: Secondary | ICD-10-CM | POA: Diagnosis not present

## 2011-07-22 DIAGNOSIS — N2581 Secondary hyperparathyroidism of renal origin: Secondary | ICD-10-CM | POA: Diagnosis not present

## 2011-07-22 DIAGNOSIS — D631 Anemia in chronic kidney disease: Secondary | ICD-10-CM | POA: Diagnosis not present

## 2011-07-25 DIAGNOSIS — N186 End stage renal disease: Secondary | ICD-10-CM | POA: Diagnosis not present

## 2011-07-25 DIAGNOSIS — Z23 Encounter for immunization: Secondary | ICD-10-CM | POA: Diagnosis not present

## 2011-07-25 DIAGNOSIS — N2581 Secondary hyperparathyroidism of renal origin: Secondary | ICD-10-CM | POA: Diagnosis not present

## 2011-07-25 DIAGNOSIS — D631 Anemia in chronic kidney disease: Secondary | ICD-10-CM | POA: Diagnosis not present

## 2011-07-25 DIAGNOSIS — D509 Iron deficiency anemia, unspecified: Secondary | ICD-10-CM | POA: Diagnosis not present

## 2011-07-27 DIAGNOSIS — D509 Iron deficiency anemia, unspecified: Secondary | ICD-10-CM | POA: Diagnosis not present

## 2011-07-27 DIAGNOSIS — N186 End stage renal disease: Secondary | ICD-10-CM | POA: Diagnosis not present

## 2011-07-27 DIAGNOSIS — Z23 Encounter for immunization: Secondary | ICD-10-CM | POA: Diagnosis not present

## 2011-07-27 DIAGNOSIS — N2581 Secondary hyperparathyroidism of renal origin: Secondary | ICD-10-CM | POA: Diagnosis not present

## 2011-07-27 DIAGNOSIS — N039 Chronic nephritic syndrome with unspecified morphologic changes: Secondary | ICD-10-CM | POA: Diagnosis not present

## 2011-07-28 DIAGNOSIS — N186 End stage renal disease: Secondary | ICD-10-CM | POA: Diagnosis not present

## 2011-07-29 DIAGNOSIS — D631 Anemia in chronic kidney disease: Secondary | ICD-10-CM | POA: Diagnosis not present

## 2011-07-29 DIAGNOSIS — D509 Iron deficiency anemia, unspecified: Secondary | ICD-10-CM | POA: Diagnosis not present

## 2011-07-29 DIAGNOSIS — N186 End stage renal disease: Secondary | ICD-10-CM | POA: Diagnosis not present

## 2011-08-01 DIAGNOSIS — D509 Iron deficiency anemia, unspecified: Secondary | ICD-10-CM | POA: Diagnosis not present

## 2011-08-01 DIAGNOSIS — N186 End stage renal disease: Secondary | ICD-10-CM | POA: Diagnosis not present

## 2011-08-01 DIAGNOSIS — N039 Chronic nephritic syndrome with unspecified morphologic changes: Secondary | ICD-10-CM | POA: Diagnosis not present

## 2011-08-03 DIAGNOSIS — N039 Chronic nephritic syndrome with unspecified morphologic changes: Secondary | ICD-10-CM | POA: Diagnosis not present

## 2011-08-03 DIAGNOSIS — N186 End stage renal disease: Secondary | ICD-10-CM | POA: Diagnosis not present

## 2011-08-03 DIAGNOSIS — D509 Iron deficiency anemia, unspecified: Secondary | ICD-10-CM | POA: Diagnosis not present

## 2011-08-03 DIAGNOSIS — D631 Anemia in chronic kidney disease: Secondary | ICD-10-CM | POA: Diagnosis not present

## 2011-08-04 ENCOUNTER — Ambulatory Visit: Payer: Self-pay | Admitting: Vascular Surgery

## 2011-08-04 DIAGNOSIS — Z79899 Other long term (current) drug therapy: Secondary | ICD-10-CM | POA: Diagnosis not present

## 2011-08-04 DIAGNOSIS — E785 Hyperlipidemia, unspecified: Secondary | ICD-10-CM | POA: Diagnosis not present

## 2011-08-04 DIAGNOSIS — T82898A Other specified complication of vascular prosthetic devices, implants and grafts, initial encounter: Secondary | ICD-10-CM | POA: Diagnosis not present

## 2011-08-04 DIAGNOSIS — Z9889 Other specified postprocedural states: Secondary | ICD-10-CM | POA: Diagnosis not present

## 2011-08-04 DIAGNOSIS — J449 Chronic obstructive pulmonary disease, unspecified: Secondary | ICD-10-CM | POA: Diagnosis not present

## 2011-08-04 DIAGNOSIS — I120 Hypertensive chronic kidney disease with stage 5 chronic kidney disease or end stage renal disease: Secondary | ICD-10-CM | POA: Diagnosis not present

## 2011-08-04 DIAGNOSIS — N186 End stage renal disease: Secondary | ICD-10-CM | POA: Diagnosis not present

## 2011-08-04 DIAGNOSIS — I10 Essential (primary) hypertension: Secondary | ICD-10-CM | POA: Diagnosis not present

## 2011-08-04 DIAGNOSIS — Z992 Dependence on renal dialysis: Secondary | ICD-10-CM | POA: Diagnosis not present

## 2011-08-04 DIAGNOSIS — E119 Type 2 diabetes mellitus without complications: Secondary | ICD-10-CM | POA: Diagnosis not present

## 2011-08-04 LAB — BASIC METABOLIC PANEL
Anion Gap: 12 (ref 7–16)
Calcium, Total: 9.2 mg/dL (ref 8.5–10.1)
Chloride: 100 mmol/L (ref 98–107)
Co2: 26 mmol/L (ref 21–32)
Osmolality: 285 (ref 275–301)
Potassium: 4.7 mmol/L (ref 3.5–5.1)

## 2011-08-05 DIAGNOSIS — D631 Anemia in chronic kidney disease: Secondary | ICD-10-CM | POA: Diagnosis not present

## 2011-08-05 DIAGNOSIS — D509 Iron deficiency anemia, unspecified: Secondary | ICD-10-CM | POA: Diagnosis not present

## 2011-08-05 DIAGNOSIS — N186 End stage renal disease: Secondary | ICD-10-CM | POA: Diagnosis not present

## 2011-08-08 DIAGNOSIS — D631 Anemia in chronic kidney disease: Secondary | ICD-10-CM | POA: Diagnosis not present

## 2011-08-08 DIAGNOSIS — D509 Iron deficiency anemia, unspecified: Secondary | ICD-10-CM | POA: Diagnosis not present

## 2011-08-08 DIAGNOSIS — N186 End stage renal disease: Secondary | ICD-10-CM | POA: Diagnosis not present

## 2011-08-10 DIAGNOSIS — N186 End stage renal disease: Secondary | ICD-10-CM | POA: Diagnosis not present

## 2011-08-10 DIAGNOSIS — D509 Iron deficiency anemia, unspecified: Secondary | ICD-10-CM | POA: Diagnosis not present

## 2011-08-10 DIAGNOSIS — N039 Chronic nephritic syndrome with unspecified morphologic changes: Secondary | ICD-10-CM | POA: Diagnosis not present

## 2011-08-12 DIAGNOSIS — N186 End stage renal disease: Secondary | ICD-10-CM | POA: Diagnosis not present

## 2011-08-12 DIAGNOSIS — D631 Anemia in chronic kidney disease: Secondary | ICD-10-CM | POA: Diagnosis not present

## 2011-08-12 DIAGNOSIS — D509 Iron deficiency anemia, unspecified: Secondary | ICD-10-CM | POA: Diagnosis not present

## 2011-08-15 DIAGNOSIS — N186 End stage renal disease: Secondary | ICD-10-CM | POA: Diagnosis not present

## 2011-08-15 DIAGNOSIS — D509 Iron deficiency anemia, unspecified: Secondary | ICD-10-CM | POA: Diagnosis not present

## 2011-08-15 DIAGNOSIS — N039 Chronic nephritic syndrome with unspecified morphologic changes: Secondary | ICD-10-CM | POA: Diagnosis not present

## 2011-08-17 DIAGNOSIS — N186 End stage renal disease: Secondary | ICD-10-CM | POA: Diagnosis not present

## 2011-08-17 DIAGNOSIS — D509 Iron deficiency anemia, unspecified: Secondary | ICD-10-CM | POA: Diagnosis not present

## 2011-08-17 DIAGNOSIS — D631 Anemia in chronic kidney disease: Secondary | ICD-10-CM | POA: Diagnosis not present

## 2011-08-18 ENCOUNTER — Ambulatory Visit: Payer: Self-pay | Admitting: Vascular Surgery

## 2011-08-18 DIAGNOSIS — I10 Essential (primary) hypertension: Secondary | ICD-10-CM | POA: Diagnosis not present

## 2011-08-18 DIAGNOSIS — Z01810 Encounter for preprocedural cardiovascular examination: Secondary | ICD-10-CM | POA: Diagnosis not present

## 2011-08-18 DIAGNOSIS — Z992 Dependence on renal dialysis: Secondary | ICD-10-CM | POA: Diagnosis not present

## 2011-08-18 DIAGNOSIS — T82598A Other mechanical complication of other cardiac and vascular devices and implants, initial encounter: Secondary | ICD-10-CM | POA: Diagnosis not present

## 2011-08-18 DIAGNOSIS — Z01812 Encounter for preprocedural laboratory examination: Secondary | ICD-10-CM | POA: Diagnosis not present

## 2011-08-18 DIAGNOSIS — N186 End stage renal disease: Secondary | ICD-10-CM | POA: Diagnosis not present

## 2011-08-18 LAB — CBC
HGB: 11.1 g/dL — ABNORMAL LOW (ref 12.0–16.0)
MCH: 26.8 pg (ref 26.0–34.0)
MCHC: 32 g/dL (ref 32.0–36.0)
RDW: 17.1 % — ABNORMAL HIGH (ref 11.5–14.5)

## 2011-08-18 LAB — BASIC METABOLIC PANEL
Anion Gap: 10 (ref 7–16)
BUN: 45 mg/dL — ABNORMAL HIGH (ref 7–18)
Calcium, Total: 8.5 mg/dL (ref 8.5–10.1)
Co2: 32 mmol/L (ref 21–32)
EGFR (African American): 8 — ABNORMAL LOW
EGFR (Non-African Amer.): 7 — ABNORMAL LOW
Osmolality: 289 (ref 275–301)
Potassium: 4.4 mmol/L (ref 3.5–5.1)

## 2011-08-19 DIAGNOSIS — N186 End stage renal disease: Secondary | ICD-10-CM | POA: Diagnosis not present

## 2011-08-19 DIAGNOSIS — D631 Anemia in chronic kidney disease: Secondary | ICD-10-CM | POA: Diagnosis not present

## 2011-08-19 DIAGNOSIS — D509 Iron deficiency anemia, unspecified: Secondary | ICD-10-CM | POA: Diagnosis not present

## 2011-08-22 DIAGNOSIS — D631 Anemia in chronic kidney disease: Secondary | ICD-10-CM | POA: Diagnosis not present

## 2011-08-22 DIAGNOSIS — N186 End stage renal disease: Secondary | ICD-10-CM | POA: Diagnosis not present

## 2011-08-22 DIAGNOSIS — D509 Iron deficiency anemia, unspecified: Secondary | ICD-10-CM | POA: Diagnosis not present

## 2011-08-24 DIAGNOSIS — D631 Anemia in chronic kidney disease: Secondary | ICD-10-CM | POA: Diagnosis not present

## 2011-08-24 DIAGNOSIS — N186 End stage renal disease: Secondary | ICD-10-CM | POA: Diagnosis not present

## 2011-08-24 DIAGNOSIS — D509 Iron deficiency anemia, unspecified: Secondary | ICD-10-CM | POA: Diagnosis not present

## 2011-08-25 ENCOUNTER — Ambulatory Visit: Payer: Self-pay | Admitting: Vascular Surgery

## 2011-08-25 DIAGNOSIS — N186 End stage renal disease: Secondary | ICD-10-CM | POA: Diagnosis not present

## 2011-08-25 DIAGNOSIS — E119 Type 2 diabetes mellitus without complications: Secondary | ICD-10-CM | POA: Diagnosis not present

## 2011-08-25 DIAGNOSIS — E785 Hyperlipidemia, unspecified: Secondary | ICD-10-CM | POA: Diagnosis not present

## 2011-08-25 DIAGNOSIS — E079 Disorder of thyroid, unspecified: Secondary | ICD-10-CM | POA: Diagnosis not present

## 2011-08-25 DIAGNOSIS — F17200 Nicotine dependence, unspecified, uncomplicated: Secondary | ICD-10-CM | POA: Diagnosis not present

## 2011-08-25 DIAGNOSIS — Z794 Long term (current) use of insulin: Secondary | ICD-10-CM | POA: Diagnosis not present

## 2011-08-25 DIAGNOSIS — I10 Essential (primary) hypertension: Secondary | ICD-10-CM | POA: Diagnosis not present

## 2011-08-25 DIAGNOSIS — M5137 Other intervertebral disc degeneration, lumbosacral region: Secondary | ICD-10-CM | POA: Diagnosis not present

## 2011-08-25 DIAGNOSIS — Z79899 Other long term (current) drug therapy: Secondary | ICD-10-CM | POA: Diagnosis not present

## 2011-08-25 DIAGNOSIS — Z7982 Long term (current) use of aspirin: Secondary | ICD-10-CM | POA: Diagnosis not present

## 2011-08-25 DIAGNOSIS — Z992 Dependence on renal dialysis: Secondary | ICD-10-CM | POA: Diagnosis not present

## 2011-08-25 DIAGNOSIS — T82598A Other mechanical complication of other cardiac and vascular devices and implants, initial encounter: Secondary | ICD-10-CM | POA: Diagnosis not present

## 2011-08-25 DIAGNOSIS — E109 Type 1 diabetes mellitus without complications: Secondary | ICD-10-CM | POA: Diagnosis not present

## 2011-08-25 DIAGNOSIS — J449 Chronic obstructive pulmonary disease, unspecified: Secondary | ICD-10-CM | POA: Diagnosis not present

## 2011-08-25 DIAGNOSIS — Z8249 Family history of ischemic heart disease and other diseases of the circulatory system: Secondary | ICD-10-CM | POA: Diagnosis not present

## 2011-08-25 DIAGNOSIS — Z833 Family history of diabetes mellitus: Secondary | ICD-10-CM | POA: Diagnosis not present

## 2011-08-25 LAB — POTASSIUM: Potassium: 4.4 mmol/L (ref 3.5–5.1)

## 2011-08-26 DIAGNOSIS — N186 End stage renal disease: Secondary | ICD-10-CM | POA: Diagnosis not present

## 2011-08-26 DIAGNOSIS — D509 Iron deficiency anemia, unspecified: Secondary | ICD-10-CM | POA: Diagnosis not present

## 2011-08-26 DIAGNOSIS — N039 Chronic nephritic syndrome with unspecified morphologic changes: Secondary | ICD-10-CM | POA: Diagnosis not present

## 2011-08-28 DIAGNOSIS — N186 End stage renal disease: Secondary | ICD-10-CM | POA: Diagnosis not present

## 2011-08-29 DIAGNOSIS — D631 Anemia in chronic kidney disease: Secondary | ICD-10-CM | POA: Diagnosis not present

## 2011-08-29 DIAGNOSIS — N2581 Secondary hyperparathyroidism of renal origin: Secondary | ICD-10-CM | POA: Diagnosis not present

## 2011-08-29 DIAGNOSIS — D509 Iron deficiency anemia, unspecified: Secondary | ICD-10-CM | POA: Diagnosis not present

## 2011-08-29 DIAGNOSIS — N186 End stage renal disease: Secondary | ICD-10-CM | POA: Diagnosis not present

## 2011-09-06 DIAGNOSIS — N186 End stage renal disease: Secondary | ICD-10-CM | POA: Diagnosis not present

## 2011-09-06 DIAGNOSIS — I70219 Atherosclerosis of native arteries of extremities with intermittent claudication, unspecified extremity: Secondary | ICD-10-CM | POA: Diagnosis not present

## 2011-09-06 DIAGNOSIS — I739 Peripheral vascular disease, unspecified: Secondary | ICD-10-CM | POA: Diagnosis not present

## 2011-09-06 DIAGNOSIS — T82898A Other specified complication of vascular prosthetic devices, implants and grafts, initial encounter: Secondary | ICD-10-CM | POA: Diagnosis not present

## 2011-09-21 DIAGNOSIS — E1129 Type 2 diabetes mellitus with other diabetic kidney complication: Secondary | ICD-10-CM | POA: Diagnosis not present

## 2011-09-27 DIAGNOSIS — T859XXA Unspecified complication of internal prosthetic device, implant and graft, initial encounter: Secondary | ICD-10-CM | POA: Diagnosis not present

## 2011-09-27 DIAGNOSIS — N186 End stage renal disease: Secondary | ICD-10-CM | POA: Diagnosis not present

## 2011-09-27 DIAGNOSIS — I10 Essential (primary) hypertension: Secondary | ICD-10-CM | POA: Diagnosis not present

## 2011-09-27 DIAGNOSIS — T82898A Other specified complication of vascular prosthetic devices, implants and grafts, initial encounter: Secondary | ICD-10-CM | POA: Diagnosis not present

## 2011-09-28 DIAGNOSIS — D509 Iron deficiency anemia, unspecified: Secondary | ICD-10-CM | POA: Diagnosis not present

## 2011-09-28 DIAGNOSIS — N2581 Secondary hyperparathyroidism of renal origin: Secondary | ICD-10-CM | POA: Diagnosis not present

## 2011-09-28 DIAGNOSIS — N186 End stage renal disease: Secondary | ICD-10-CM | POA: Diagnosis not present

## 2011-09-28 DIAGNOSIS — D631 Anemia in chronic kidney disease: Secondary | ICD-10-CM | POA: Diagnosis not present

## 2011-10-13 ENCOUNTER — Ambulatory Visit: Payer: Self-pay | Admitting: Vascular Surgery

## 2011-10-13 DIAGNOSIS — I120 Hypertensive chronic kidney disease with stage 5 chronic kidney disease or end stage renal disease: Secondary | ICD-10-CM | POA: Diagnosis not present

## 2011-10-13 DIAGNOSIS — N186 End stage renal disease: Secondary | ICD-10-CM | POA: Diagnosis not present

## 2011-10-13 DIAGNOSIS — E119 Type 2 diabetes mellitus without complications: Secondary | ICD-10-CM | POA: Diagnosis not present

## 2011-10-13 DIAGNOSIS — I10 Essential (primary) hypertension: Secondary | ICD-10-CM | POA: Diagnosis not present

## 2011-10-13 DIAGNOSIS — J449 Chronic obstructive pulmonary disease, unspecified: Secondary | ICD-10-CM | POA: Diagnosis not present

## 2011-10-13 DIAGNOSIS — T82898A Other specified complication of vascular prosthetic devices, implants and grafts, initial encounter: Secondary | ICD-10-CM | POA: Diagnosis not present

## 2011-10-13 DIAGNOSIS — Z794 Long term (current) use of insulin: Secondary | ICD-10-CM | POA: Diagnosis not present

## 2011-10-13 DIAGNOSIS — Z79899 Other long term (current) drug therapy: Secondary | ICD-10-CM | POA: Diagnosis not present

## 2011-10-13 DIAGNOSIS — E785 Hyperlipidemia, unspecified: Secondary | ICD-10-CM | POA: Diagnosis not present

## 2011-10-13 DIAGNOSIS — Z992 Dependence on renal dialysis: Secondary | ICD-10-CM | POA: Diagnosis not present

## 2011-10-13 LAB — BASIC METABOLIC PANEL
Calcium, Total: 8.6 mg/dL (ref 8.5–10.1)
Chloride: 99 mmol/L (ref 98–107)
EGFR (Non-African Amer.): 8 — ABNORMAL LOW
Glucose: 118 mg/dL — ABNORMAL HIGH (ref 65–99)
Osmolality: 282 (ref 275–301)
Potassium: 4.8 mmol/L (ref 3.5–5.1)
Sodium: 136 mmol/L (ref 136–145)

## 2011-10-13 LAB — CBC
HCT: 38.3 % (ref 35.0–47.0)
HGB: 11.9 g/dL — ABNORMAL LOW (ref 12.0–16.0)
MCHC: 31.1 g/dL — ABNORMAL LOW (ref 32.0–36.0)
Platelet: 139 10*3/uL — ABNORMAL LOW (ref 150–440)
WBC: 5 10*3/uL (ref 3.6–11.0)

## 2011-10-28 DIAGNOSIS — N186 End stage renal disease: Secondary | ICD-10-CM | POA: Diagnosis not present

## 2011-10-31 DIAGNOSIS — D631 Anemia in chronic kidney disease: Secondary | ICD-10-CM | POA: Diagnosis not present

## 2011-10-31 DIAGNOSIS — N186 End stage renal disease: Secondary | ICD-10-CM | POA: Diagnosis not present

## 2011-10-31 DIAGNOSIS — D509 Iron deficiency anemia, unspecified: Secondary | ICD-10-CM | POA: Diagnosis not present

## 2011-10-31 DIAGNOSIS — N2581 Secondary hyperparathyroidism of renal origin: Secondary | ICD-10-CM | POA: Diagnosis not present

## 2011-11-01 ENCOUNTER — Other Ambulatory Visit (HOSPITAL_COMMUNITY): Payer: Self-pay | Admitting: Internal Medicine

## 2011-11-01 DIAGNOSIS — IMO0001 Reserved for inherently not codable concepts without codable children: Secondary | ICD-10-CM

## 2011-11-02 DIAGNOSIS — D509 Iron deficiency anemia, unspecified: Secondary | ICD-10-CM | POA: Diagnosis not present

## 2011-11-02 DIAGNOSIS — D631 Anemia in chronic kidney disease: Secondary | ICD-10-CM | POA: Diagnosis not present

## 2011-11-02 DIAGNOSIS — N186 End stage renal disease: Secondary | ICD-10-CM | POA: Diagnosis not present

## 2011-11-02 DIAGNOSIS — N2581 Secondary hyperparathyroidism of renal origin: Secondary | ICD-10-CM | POA: Diagnosis not present

## 2011-11-02 DIAGNOSIS — N039 Chronic nephritic syndrome with unspecified morphologic changes: Secondary | ICD-10-CM | POA: Diagnosis not present

## 2011-11-04 DIAGNOSIS — D631 Anemia in chronic kidney disease: Secondary | ICD-10-CM | POA: Diagnosis not present

## 2011-11-04 DIAGNOSIS — N2581 Secondary hyperparathyroidism of renal origin: Secondary | ICD-10-CM | POA: Diagnosis not present

## 2011-11-04 DIAGNOSIS — N186 End stage renal disease: Secondary | ICD-10-CM | POA: Diagnosis not present

## 2011-11-04 DIAGNOSIS — D509 Iron deficiency anemia, unspecified: Secondary | ICD-10-CM | POA: Diagnosis not present

## 2011-11-07 DIAGNOSIS — N2581 Secondary hyperparathyroidism of renal origin: Secondary | ICD-10-CM | POA: Diagnosis not present

## 2011-11-07 DIAGNOSIS — N186 End stage renal disease: Secondary | ICD-10-CM | POA: Diagnosis not present

## 2011-11-07 DIAGNOSIS — D509 Iron deficiency anemia, unspecified: Secondary | ICD-10-CM | POA: Diagnosis not present

## 2011-11-07 DIAGNOSIS — D631 Anemia in chronic kidney disease: Secondary | ICD-10-CM | POA: Diagnosis not present

## 2011-11-09 DIAGNOSIS — N2581 Secondary hyperparathyroidism of renal origin: Secondary | ICD-10-CM | POA: Diagnosis not present

## 2011-11-09 DIAGNOSIS — D509 Iron deficiency anemia, unspecified: Secondary | ICD-10-CM | POA: Diagnosis not present

## 2011-11-09 DIAGNOSIS — N186 End stage renal disease: Secondary | ICD-10-CM | POA: Diagnosis not present

## 2011-11-09 DIAGNOSIS — N039 Chronic nephritic syndrome with unspecified morphologic changes: Secondary | ICD-10-CM | POA: Diagnosis not present

## 2011-11-10 ENCOUNTER — Ambulatory Visit (HOSPITAL_COMMUNITY)
Admission: RE | Admit: 2011-11-10 | Discharge: 2011-11-10 | Disposition: A | Discharge status: Home / Self Care | Payer: Medicare Other | Source: Ambulatory Visit | Attending: Internal Medicine | Admitting: Internal Medicine

## 2011-11-10 DIAGNOSIS — Z1231 Encounter for screening mammogram for malignant neoplasm of breast: Secondary | ICD-10-CM | POA: Insufficient documentation

## 2011-11-10 DIAGNOSIS — IMO0001 Reserved for inherently not codable concepts without codable children: Secondary | ICD-10-CM

## 2011-11-11 DIAGNOSIS — N039 Chronic nephritic syndrome with unspecified morphologic changes: Secondary | ICD-10-CM | POA: Diagnosis not present

## 2011-11-11 DIAGNOSIS — N2581 Secondary hyperparathyroidism of renal origin: Secondary | ICD-10-CM | POA: Diagnosis not present

## 2011-11-11 DIAGNOSIS — D509 Iron deficiency anemia, unspecified: Secondary | ICD-10-CM | POA: Diagnosis not present

## 2011-11-11 DIAGNOSIS — N186 End stage renal disease: Secondary | ICD-10-CM | POA: Diagnosis not present

## 2011-11-14 DIAGNOSIS — D509 Iron deficiency anemia, unspecified: Secondary | ICD-10-CM | POA: Diagnosis not present

## 2011-11-14 DIAGNOSIS — N2581 Secondary hyperparathyroidism of renal origin: Secondary | ICD-10-CM | POA: Diagnosis not present

## 2011-11-14 DIAGNOSIS — N186 End stage renal disease: Secondary | ICD-10-CM | POA: Diagnosis not present

## 2011-11-14 DIAGNOSIS — N039 Chronic nephritic syndrome with unspecified morphologic changes: Secondary | ICD-10-CM | POA: Diagnosis not present

## 2011-11-14 DIAGNOSIS — D631 Anemia in chronic kidney disease: Secondary | ICD-10-CM | POA: Diagnosis not present

## 2011-11-16 DIAGNOSIS — N186 End stage renal disease: Secondary | ICD-10-CM | POA: Diagnosis not present

## 2011-11-16 DIAGNOSIS — D631 Anemia in chronic kidney disease: Secondary | ICD-10-CM | POA: Diagnosis not present

## 2011-11-16 DIAGNOSIS — E039 Hypothyroidism, unspecified: Secondary | ICD-10-CM | POA: Diagnosis not present

## 2011-11-16 DIAGNOSIS — N2581 Secondary hyperparathyroidism of renal origin: Secondary | ICD-10-CM | POA: Diagnosis not present

## 2011-11-16 DIAGNOSIS — D509 Iron deficiency anemia, unspecified: Secondary | ICD-10-CM | POA: Diagnosis not present

## 2011-11-18 DIAGNOSIS — N186 End stage renal disease: Secondary | ICD-10-CM | POA: Diagnosis not present

## 2011-11-18 DIAGNOSIS — D509 Iron deficiency anemia, unspecified: Secondary | ICD-10-CM | POA: Diagnosis not present

## 2011-11-18 DIAGNOSIS — N039 Chronic nephritic syndrome with unspecified morphologic changes: Secondary | ICD-10-CM | POA: Diagnosis not present

## 2011-11-18 DIAGNOSIS — N2581 Secondary hyperparathyroidism of renal origin: Secondary | ICD-10-CM | POA: Diagnosis not present

## 2011-11-21 DIAGNOSIS — N186 End stage renal disease: Secondary | ICD-10-CM | POA: Diagnosis not present

## 2011-11-21 DIAGNOSIS — N2581 Secondary hyperparathyroidism of renal origin: Secondary | ICD-10-CM | POA: Diagnosis not present

## 2011-11-21 DIAGNOSIS — D631 Anemia in chronic kidney disease: Secondary | ICD-10-CM | POA: Diagnosis not present

## 2011-11-21 DIAGNOSIS — D509 Iron deficiency anemia, unspecified: Secondary | ICD-10-CM | POA: Diagnosis not present

## 2011-11-23 DIAGNOSIS — D631 Anemia in chronic kidney disease: Secondary | ICD-10-CM | POA: Diagnosis not present

## 2011-11-23 DIAGNOSIS — D509 Iron deficiency anemia, unspecified: Secondary | ICD-10-CM | POA: Diagnosis not present

## 2011-11-23 DIAGNOSIS — N2581 Secondary hyperparathyroidism of renal origin: Secondary | ICD-10-CM | POA: Diagnosis not present

## 2011-11-23 DIAGNOSIS — N186 End stage renal disease: Secondary | ICD-10-CM | POA: Diagnosis not present

## 2011-11-25 DIAGNOSIS — N186 End stage renal disease: Secondary | ICD-10-CM | POA: Diagnosis not present

## 2011-11-25 DIAGNOSIS — D509 Iron deficiency anemia, unspecified: Secondary | ICD-10-CM | POA: Diagnosis not present

## 2011-11-25 DIAGNOSIS — N2581 Secondary hyperparathyroidism of renal origin: Secondary | ICD-10-CM | POA: Diagnosis not present

## 2011-11-25 DIAGNOSIS — N039 Chronic nephritic syndrome with unspecified morphologic changes: Secondary | ICD-10-CM | POA: Diagnosis not present

## 2011-11-27 DIAGNOSIS — N186 End stage renal disease: Secondary | ICD-10-CM | POA: Diagnosis not present

## 2011-11-28 DIAGNOSIS — N2581 Secondary hyperparathyroidism of renal origin: Secondary | ICD-10-CM | POA: Diagnosis not present

## 2011-11-28 DIAGNOSIS — D509 Iron deficiency anemia, unspecified: Secondary | ICD-10-CM | POA: Diagnosis not present

## 2011-11-28 DIAGNOSIS — N186 End stage renal disease: Secondary | ICD-10-CM | POA: Diagnosis not present

## 2011-11-28 DIAGNOSIS — D631 Anemia in chronic kidney disease: Secondary | ICD-10-CM | POA: Diagnosis not present

## 2011-11-29 DIAGNOSIS — I10 Essential (primary) hypertension: Secondary | ICD-10-CM | POA: Diagnosis not present

## 2011-11-29 DIAGNOSIS — N186 End stage renal disease: Secondary | ICD-10-CM | POA: Diagnosis not present

## 2011-11-29 DIAGNOSIS — T82898A Other specified complication of vascular prosthetic devices, implants and grafts, initial encounter: Secondary | ICD-10-CM | POA: Diagnosis not present

## 2011-12-06 DIAGNOSIS — E119 Type 2 diabetes mellitus without complications: Secondary | ICD-10-CM | POA: Diagnosis not present

## 2011-12-06 DIAGNOSIS — E1149 Type 2 diabetes mellitus with other diabetic neurological complication: Secondary | ICD-10-CM | POA: Diagnosis not present

## 2011-12-08 ENCOUNTER — Ambulatory Visit: Payer: Self-pay | Admitting: Vascular Surgery

## 2011-12-08 DIAGNOSIS — T82898A Other specified complication of vascular prosthetic devices, implants and grafts, initial encounter: Secondary | ICD-10-CM | POA: Diagnosis not present

## 2011-12-08 DIAGNOSIS — I129 Hypertensive chronic kidney disease with stage 1 through stage 4 chronic kidney disease, or unspecified chronic kidney disease: Secondary | ICD-10-CM | POA: Diagnosis not present

## 2011-12-08 DIAGNOSIS — Z79899 Other long term (current) drug therapy: Secondary | ICD-10-CM | POA: Diagnosis not present

## 2011-12-08 DIAGNOSIS — I739 Peripheral vascular disease, unspecified: Secondary | ICD-10-CM | POA: Diagnosis not present

## 2011-12-08 DIAGNOSIS — N186 End stage renal disease: Secondary | ICD-10-CM | POA: Diagnosis not present

## 2011-12-08 DIAGNOSIS — I10 Essential (primary) hypertension: Secondary | ICD-10-CM | POA: Diagnosis not present

## 2011-12-08 DIAGNOSIS — Z9889 Other specified postprocedural states: Secondary | ICD-10-CM | POA: Diagnosis not present

## 2011-12-08 DIAGNOSIS — T82598A Other mechanical complication of other cardiac and vascular devices and implants, initial encounter: Secondary | ICD-10-CM | POA: Diagnosis not present

## 2011-12-08 DIAGNOSIS — E119 Type 2 diabetes mellitus without complications: Secondary | ICD-10-CM | POA: Diagnosis not present

## 2011-12-13 DIAGNOSIS — T82898A Other specified complication of vascular prosthetic devices, implants and grafts, initial encounter: Secondary | ICD-10-CM | POA: Diagnosis not present

## 2011-12-13 DIAGNOSIS — I10 Essential (primary) hypertension: Secondary | ICD-10-CM | POA: Diagnosis not present

## 2011-12-13 DIAGNOSIS — N186 End stage renal disease: Secondary | ICD-10-CM | POA: Diagnosis not present

## 2011-12-13 DIAGNOSIS — I70219 Atherosclerosis of native arteries of extremities with intermittent claudication, unspecified extremity: Secondary | ICD-10-CM | POA: Diagnosis not present

## 2011-12-21 DIAGNOSIS — E039 Hypothyroidism, unspecified: Secondary | ICD-10-CM | POA: Diagnosis not present

## 2011-12-21 DIAGNOSIS — E1129 Type 2 diabetes mellitus with other diabetic kidney complication: Secondary | ICD-10-CM | POA: Diagnosis not present

## 2011-12-28 DIAGNOSIS — N186 End stage renal disease: Secondary | ICD-10-CM | POA: Diagnosis not present

## 2011-12-30 DIAGNOSIS — N2581 Secondary hyperparathyroidism of renal origin: Secondary | ICD-10-CM | POA: Diagnosis not present

## 2011-12-30 DIAGNOSIS — N186 End stage renal disease: Secondary | ICD-10-CM | POA: Diagnosis not present

## 2011-12-30 DIAGNOSIS — D509 Iron deficiency anemia, unspecified: Secondary | ICD-10-CM | POA: Diagnosis not present

## 2011-12-30 DIAGNOSIS — D631 Anemia in chronic kidney disease: Secondary | ICD-10-CM | POA: Diagnosis not present

## 2012-01-18 ENCOUNTER — Ambulatory Visit: Payer: Self-pay | Admitting: Vascular Surgery

## 2012-01-18 DIAGNOSIS — K449 Diaphragmatic hernia without obstruction or gangrene: Secondary | ICD-10-CM | POA: Diagnosis not present

## 2012-01-18 DIAGNOSIS — E109 Type 1 diabetes mellitus without complications: Secondary | ICD-10-CM | POA: Diagnosis not present

## 2012-01-18 DIAGNOSIS — J449 Chronic obstructive pulmonary disease, unspecified: Secondary | ICD-10-CM | POA: Diagnosis not present

## 2012-01-18 DIAGNOSIS — Z8249 Family history of ischemic heart disease and other diseases of the circulatory system: Secondary | ICD-10-CM | POA: Diagnosis not present

## 2012-01-18 DIAGNOSIS — H3530 Unspecified macular degeneration: Secondary | ICD-10-CM | POA: Diagnosis not present

## 2012-01-18 DIAGNOSIS — Z992 Dependence on renal dialysis: Secondary | ICD-10-CM | POA: Diagnosis not present

## 2012-01-18 DIAGNOSIS — IMO0002 Reserved for concepts with insufficient information to code with codable children: Secondary | ICD-10-CM | POA: Diagnosis not present

## 2012-01-18 DIAGNOSIS — I120 Hypertensive chronic kidney disease with stage 5 chronic kidney disease or end stage renal disease: Secondary | ICD-10-CM | POA: Diagnosis not present

## 2012-01-18 DIAGNOSIS — I739 Peripheral vascular disease, unspecified: Secondary | ICD-10-CM | POA: Diagnosis not present

## 2012-01-18 DIAGNOSIS — T82898A Other specified complication of vascular prosthetic devices, implants and grafts, initial encounter: Secondary | ICD-10-CM | POA: Diagnosis not present

## 2012-01-18 DIAGNOSIS — Z794 Long term (current) use of insulin: Secondary | ICD-10-CM | POA: Diagnosis not present

## 2012-01-18 DIAGNOSIS — Z6831 Body mass index (BMI) 31.0-31.9, adult: Secondary | ICD-10-CM | POA: Diagnosis not present

## 2012-01-18 DIAGNOSIS — F17200 Nicotine dependence, unspecified, uncomplicated: Secondary | ICD-10-CM | POA: Diagnosis not present

## 2012-01-18 DIAGNOSIS — I770 Arteriovenous fistula, acquired: Secondary | ICD-10-CM | POA: Diagnosis not present

## 2012-01-18 DIAGNOSIS — E119 Type 2 diabetes mellitus without complications: Secondary | ICD-10-CM | POA: Diagnosis not present

## 2012-01-18 DIAGNOSIS — E785 Hyperlipidemia, unspecified: Secondary | ICD-10-CM | POA: Diagnosis not present

## 2012-01-18 DIAGNOSIS — R002 Palpitations: Secondary | ICD-10-CM | POA: Diagnosis not present

## 2012-01-18 DIAGNOSIS — Z79899 Other long term (current) drug therapy: Secondary | ICD-10-CM | POA: Diagnosis not present

## 2012-01-18 DIAGNOSIS — E039 Hypothyroidism, unspecified: Secondary | ICD-10-CM | POA: Diagnosis not present

## 2012-01-18 DIAGNOSIS — Z833 Family history of diabetes mellitus: Secondary | ICD-10-CM | POA: Diagnosis not present

## 2012-01-18 DIAGNOSIS — N186 End stage renal disease: Secondary | ICD-10-CM | POA: Diagnosis not present

## 2012-01-18 LAB — BASIC METABOLIC PANEL
Anion Gap: 12 (ref 7–16)
BUN: 89 mg/dL — ABNORMAL HIGH (ref 7–18)
Creatinine: 8.23 mg/dL — ABNORMAL HIGH (ref 0.60–1.30)
EGFR (African American): 5 — ABNORMAL LOW
EGFR (Non-African Amer.): 5 — ABNORMAL LOW
Glucose: 181 mg/dL — ABNORMAL HIGH (ref 65–99)
Osmolality: 302 (ref 275–301)

## 2012-01-18 LAB — CBC
HGB: 10.6 g/dL — ABNORMAL LOW (ref 12.0–16.0)
MCH: 26.8 pg (ref 26.0–34.0)
MCHC: 32.5 g/dL (ref 32.0–36.0)
MCV: 83 fL (ref 80–100)
RBC: 3.95 10*6/uL (ref 3.80–5.20)

## 2012-01-28 DIAGNOSIS — N186 End stage renal disease: Secondary | ICD-10-CM | POA: Diagnosis not present

## 2012-01-30 DIAGNOSIS — Z23 Encounter for immunization: Secondary | ICD-10-CM | POA: Diagnosis not present

## 2012-01-30 DIAGNOSIS — T82598A Other mechanical complication of other cardiac and vascular devices and implants, initial encounter: Secondary | ICD-10-CM | POA: Diagnosis not present

## 2012-01-30 DIAGNOSIS — T827XXA Infection and inflammatory reaction due to other cardiac and vascular devices, implants and grafts, initial encounter: Secondary | ICD-10-CM | POA: Diagnosis not present

## 2012-01-30 DIAGNOSIS — D631 Anemia in chronic kidney disease: Secondary | ICD-10-CM | POA: Diagnosis not present

## 2012-01-30 DIAGNOSIS — N2581 Secondary hyperparathyroidism of renal origin: Secondary | ICD-10-CM | POA: Diagnosis not present

## 2012-01-30 DIAGNOSIS — D509 Iron deficiency anemia, unspecified: Secondary | ICD-10-CM | POA: Diagnosis not present

## 2012-01-30 DIAGNOSIS — N186 End stage renal disease: Secondary | ICD-10-CM | POA: Diagnosis not present

## 2012-02-02 ENCOUNTER — Ambulatory Visit: Payer: Self-pay | Admitting: Vascular Surgery

## 2012-02-02 DIAGNOSIS — Z833 Family history of diabetes mellitus: Secondary | ICD-10-CM | POA: Diagnosis not present

## 2012-02-02 DIAGNOSIS — Z87891 Personal history of nicotine dependence: Secondary | ICD-10-CM | POA: Diagnosis not present

## 2012-02-02 DIAGNOSIS — Z794 Long term (current) use of insulin: Secondary | ICD-10-CM | POA: Diagnosis not present

## 2012-02-02 DIAGNOSIS — Z992 Dependence on renal dialysis: Secondary | ICD-10-CM | POA: Diagnosis not present

## 2012-02-02 DIAGNOSIS — E669 Obesity, unspecified: Secondary | ICD-10-CM | POA: Diagnosis not present

## 2012-02-02 DIAGNOSIS — Z6831 Body mass index (BMI) 31.0-31.9, adult: Secondary | ICD-10-CM | POA: Diagnosis not present

## 2012-02-02 DIAGNOSIS — Z8249 Family history of ischemic heart disease and other diseases of the circulatory system: Secondary | ICD-10-CM | POA: Diagnosis not present

## 2012-02-02 DIAGNOSIS — E079 Disorder of thyroid, unspecified: Secondary | ICD-10-CM | POA: Diagnosis not present

## 2012-02-02 DIAGNOSIS — R002 Palpitations: Secondary | ICD-10-CM | POA: Diagnosis not present

## 2012-02-02 DIAGNOSIS — I739 Peripheral vascular disease, unspecified: Secondary | ICD-10-CM | POA: Diagnosis not present

## 2012-02-02 DIAGNOSIS — J449 Chronic obstructive pulmonary disease, unspecified: Secondary | ICD-10-CM | POA: Diagnosis not present

## 2012-02-02 DIAGNOSIS — Z79899 Other long term (current) drug therapy: Secondary | ICD-10-CM | POA: Diagnosis not present

## 2012-02-02 DIAGNOSIS — R209 Unspecified disturbances of skin sensation: Secondary | ICD-10-CM | POA: Diagnosis not present

## 2012-02-02 DIAGNOSIS — IMO0002 Reserved for concepts with insufficient information to code with codable children: Secondary | ICD-10-CM | POA: Diagnosis not present

## 2012-02-02 DIAGNOSIS — E109 Type 1 diabetes mellitus without complications: Secondary | ICD-10-CM | POA: Diagnosis not present

## 2012-02-02 DIAGNOSIS — N186 End stage renal disease: Secondary | ICD-10-CM | POA: Diagnosis not present

## 2012-02-02 DIAGNOSIS — I10 Essential (primary) hypertension: Secondary | ICD-10-CM | POA: Diagnosis not present

## 2012-02-02 DIAGNOSIS — Z91041 Radiographic dye allergy status: Secondary | ICD-10-CM | POA: Diagnosis not present

## 2012-02-02 DIAGNOSIS — I120 Hypertensive chronic kidney disease with stage 5 chronic kidney disease or end stage renal disease: Secondary | ICD-10-CM | POA: Diagnosis not present

## 2012-02-02 LAB — POTASSIUM: Potassium: 3.8 mmol/L (ref 3.5–5.1)

## 2012-02-09 DIAGNOSIS — E119 Type 2 diabetes mellitus without complications: Secondary | ICD-10-CM | POA: Diagnosis not present

## 2012-02-09 DIAGNOSIS — R5383 Other fatigue: Secondary | ICD-10-CM | POA: Diagnosis not present

## 2012-02-09 DIAGNOSIS — R5381 Other malaise: Secondary | ICD-10-CM | POA: Diagnosis not present

## 2012-02-09 DIAGNOSIS — N19 Unspecified kidney failure: Secondary | ICD-10-CM | POA: Diagnosis not present

## 2012-02-10 ENCOUNTER — Encounter (HOSPITAL_COMMUNITY): Payer: Self-pay | Admitting: *Deleted

## 2012-02-10 ENCOUNTER — Emergency Department (HOSPITAL_COMMUNITY): Payer: Medicare Other

## 2012-02-10 ENCOUNTER — Emergency Department (HOSPITAL_COMMUNITY)
Admission: EM | Admit: 2012-02-10 | Discharge: 2012-02-11 | Disposition: A | Discharge status: Home / Self Care | Payer: Medicare Other | Attending: Emergency Medicine | Admitting: Emergency Medicine

## 2012-02-10 DIAGNOSIS — R739 Hyperglycemia, unspecified: Secondary | ICD-10-CM

## 2012-02-10 DIAGNOSIS — N289 Disorder of kidney and ureter, unspecified: Secondary | ICD-10-CM | POA: Insufficient documentation

## 2012-02-10 DIAGNOSIS — R0989 Other specified symptoms and signs involving the circulatory and respiratory systems: Secondary | ICD-10-CM | POA: Diagnosis not present

## 2012-02-10 DIAGNOSIS — R5381 Other malaise: Secondary | ICD-10-CM | POA: Diagnosis not present

## 2012-02-10 DIAGNOSIS — Z7982 Long term (current) use of aspirin: Secondary | ICD-10-CM | POA: Diagnosis not present

## 2012-02-10 DIAGNOSIS — F17200 Nicotine dependence, unspecified, uncomplicated: Secondary | ICD-10-CM | POA: Insufficient documentation

## 2012-02-10 DIAGNOSIS — R509 Fever, unspecified: Secondary | ICD-10-CM | POA: Diagnosis not present

## 2012-02-10 DIAGNOSIS — I10 Essential (primary) hypertension: Secondary | ICD-10-CM | POA: Insufficient documentation

## 2012-02-10 DIAGNOSIS — Z794 Long term (current) use of insulin: Secondary | ICD-10-CM | POA: Insufficient documentation

## 2012-02-10 DIAGNOSIS — J069 Acute upper respiratory infection, unspecified: Secondary | ICD-10-CM | POA: Diagnosis not present

## 2012-02-10 DIAGNOSIS — E119 Type 2 diabetes mellitus without complications: Secondary | ICD-10-CM | POA: Diagnosis not present

## 2012-02-10 DIAGNOSIS — Z91041 Radiographic dye allergy status: Secondary | ICD-10-CM | POA: Diagnosis not present

## 2012-02-10 DIAGNOSIS — R531 Weakness: Secondary | ICD-10-CM

## 2012-02-10 DIAGNOSIS — R5383 Other fatigue: Secondary | ICD-10-CM | POA: Insufficient documentation

## 2012-02-10 MED ORDER — ACETAMINOPHEN 325 MG PO TABS
650.0000 mg | ORAL_TABLET | Freq: Once | ORAL | Status: AC
  Administered 2012-02-10: 650 mg via ORAL
  Filled 2012-02-10 (×2): qty 2

## 2012-02-10 NOTE — ED Notes (Signed)
Feels weak today after dialysis today,vomiting, No diarrhea.No pain.

## 2012-02-10 NOTE — ED Provider Notes (Signed)
History     CSN: [id number]  Arrival date & time 02/10/12  2253   First MD Initiated Contact with Patient 02/10/12 2321      Chief Complaint  Patient presents with  . Weakness    (Consider location/radiation/quality/duration/timing/severity/associated sxs/prior treatment) HPI  [name] is a 66 y.o. female  With a h/o ESRD on dialysis M-W-F, DM, COPD, HTN, who presents to the Emergency Department complaining of loss of appetite and generalized weakness today since having dialysis this morning. Per family members she has been a little more confused to day at times. Has had nausea, vomiting x 1.   PCP Dr. [name]   Past Medical History  Diagnosis Date  . Renal disorder   . Dialysis care     M-W-F dialysis at [facility]  . COPD (chronic obstructive pulmonary disease)   . Diabetes mellitus   . Hypertension   . Thyroid disease   . Hyperlipidemia   . Bronchitis   . Leg pain     Past Surgical History  Procedure Date  . Cholecystectomy   . Dg av dialysis shunt access exist*r* or     working right HD catheter  . Av fistula placement 05/18/2010    History reviewed. No pertinent family history.  History  Substance Use Topics  . Smoking status: Current Every Day Smoker -- 0.5 packs/day for 30 years    Types: Cigarettes  . Smokeless tobacco: Not on file  . Alcohol Use: No    OB History    Grav Para Term Preterm Abortions TAB SAB Ect Mult Living            2      Review of Systems  Constitutional: Positive for fatigue. Negative for fever.       10 Systems reviewed and are negative for acute change except as noted in the HPI.  HENT: Negative for congestion.   Eyes: Negative for discharge and redness.  Respiratory: Negative for cough and shortness of breath.   Cardiovascular: Negative for chest pain.  Gastrointestinal: Positive for vomiting. Negative for abdominal pain.  Musculoskeletal: Negative for back pain.  Skin: Negative for rash.    Neurological: Negative for syncope, numbness and headaches.  Psychiatric/Behavioral:       No behavior change.    Allergies  Contrast media  Home Medications   Current Outpatient Rx  Name Route Sig Dispense Refill  . ALBUTEROL SULFATE HFA 108 (90 BASE) MCG/ACT IN AERS Inhalation Inhale 2 puffs into the lungs every 6 (six) hours as needed. For shortness of breath     . AMLODIPINE BESYLATE 5 MG PO TABS Oral Take 5 mg by mouth daily.      . ASPIRIN EC 81 MG PO TBEC Oral Take 81 mg by mouth daily.      . ATORVASTATIN CALCIUM 40 MG PO TABS Oral Take 40 mg by mouth daily.      Marland Kitchen VOL-CARE RX 1 MG PO TABS Oral Take 1 tablet by mouth daily.      Marland Kitchen DOXAZOSIN MESYLATE 8 MG PO TABS Oral Take 8 mg by mouth at bedtime.      . OMEGA-3 FATTY ACIDS 1000 MG PO CAPS Oral Take 2 g by mouth daily.      Marland Kitchen GLIPIZIDE 10 MG PO TABS Oral Take 10 mg by mouth 2 (two) times daily before a meal.      . GUAIFENESIN-CODEINE 100-10 MG/5ML PO SYRP Oral Take 5 mLs by mouth 4 (four)  times daily as needed. For coughing      . INSULIN LISPRO PROT & LISPRO (75-25) 100 UNIT/ML [place] SUSP Subcutaneous Inject 15-35 Units into the skin. Patient takes 35 units every morning and 15 units in the evening     . LEVOTHYROXINE SODIUM 75 MCG PO TABS Oral Take 75 mcg by mouth daily.      Marland Kitchen NIACIN ER (ANTIHYPERLIPIDEMIC) 500 MG PO TBCR Oral Take 500 mg by mouth at bedtime.        BP 128/62  Pulse 100  Temp 103.1 F (39.5 C) (Oral)  Resp 20  Ht 5\' 3"  (1.6 m)  Wt 157 lb (71.215 kg)  BMI 27.81 kg/m2  SpO2 97%  Physical Exam  Nursing note and vitals reviewed. Constitutional: [name] oriented to person, place, and time. She appears well-developed and well-nourished.       Awake, alert, nontoxic appearance.  HENT:  Head: Atraumatic.  Eyes: Right eye exhibits no discharge. Left eye exhibits no discharge.  Neck: Neck supple.  Cardiovascular: Normal heart sounds.   Pulmonary/Chest: Effort normal. She has wheezes. She exhibits no  tenderness.       Crackles at both bases, end expiratory wheezing on right side  Abdominal: Soft. Bowel sounds are normal. There is no tenderness. There is no rebound.  Musculoskeletal: Normal range of motion. She exhibits no tenderness.       Baseline ROM, no obvious new focal weakness.  Neurological: [name] alert and oriented to person, place, and time.       Mental status and motor strength appears baseline for patient and situation.  Skin: No rash noted.  Psychiatric: She has a normal mood and affect.    ED Course  Procedures (including critical care time) Results for orders placed during the hospital encounter of 02/10/12  CBC WITH DIFFERENTIAL      Component Value Range   WBC 8.2  4.0 - 10.5 K/uL   RBC 3.58 (*) 3.87 - 5.11 MIL/uL   Hemoglobin 9.8 (*) 12.0 - 15.0 g/dL   HCT 16.1 (*) 09.6 - 04.5 %   MCV 87.4  78.0 - 100.0 fL   MCH 27.4  26.0 - 34.0 pg   MCHC 31.3  30.0 - 36.0 g/dL   RDW 40.9 (*) 81.1 - 91.4 %   Platelets 182  150 - 400 K/uL   Neutrophils Relative 87 (*) 43 - 77 %   Neutro Abs 7.1  1.7 - 7.7 K/uL   Lymphocytes Relative 8 (*) 12 - 46 %   Lymphs Abs 0.7  0.7 - 4.0 K/uL   Monocytes Relative 5  3 - 12 %   Monocytes Absolute 0.4  0.1 - 1.0 K/uL   Eosinophils Relative 0  0 - 5 %   Eosinophils Absolute 0.0  0.0 - 0.7 K/uL   Basophils Relative 0  0 - 1 %   Basophils Absolute 0.0  0.0 - 0.1 K/uL  COMPREHENSIVE METABOLIC PANEL      Component Value Range   Sodium 131 (*) 135 - 145 mEq/L   Potassium 4.3  3.5 - 5.1 mEq/L   Chloride 89 (*) 96 - 112 mEq/L   CO2 31  19 - 32 mEq/L   Glucose, Bld 230 (*) 70 - 99 mg/dL   BUN 22  6 - 23 mg/dL   Creatinine, Ser 7.82 (*) 0.50 - 1.10 mg/dL   Calcium 8.6  8.4 - 95.6 mg/dL   Total Protein 7.4  6.0 - 8.3 g/dL  Albumin 3.1 (*) 3.5 - 5.2 g/dL   AST 27  0 - 37 U/L   ALT 9  0 - 35 U/L   Alkaline Phosphatase 74  39 - 117 U/L   Total Bilirubin 0.5  0.3 - 1.2 mg/dL   GFR calc non Af Amer 8 (*) >90 mL/min   GFR calc Af Amer 10  (*) >90 mL/min  CULTURE, BLOOD (ROUTINE X 2)      Component Value Range   Specimen Description Blood RIGHT ANTECUBITAL     Special Requests BOTTLES DRAWN AEROBIC AND ANAEROBIC 7CC     Culture PENDING     Report Status PENDING    LACTIC ACID, PLASMA      Component Value Range   Lactic Acid, Venous 0.7  0.5 - 2.2 mmol/L  PROCALCITONIN      Component Value Range   Procalcitonin 7.85    CULTURE, BLOOD (ROUTINE X 2)      Component Value Range   Specimen Description BLOOD BLOOD RIGHT ARM     Special Requests       Value: BOTTLES DRAWN AEROBIC AND ANAEROBIC 6CC DRAWN BY RN   Culture PENDING     Report Status PENDING      Dg Chest 1 View  02/11/2012  *RADIOLOGY REPORT*  Clinical Data: Weakness, vomiting.  CHEST - 1 VIEW  Comparison: 03/11/2011  Findings: Cardiomegaly. Large bore right-sided central venous catheter.  Central vascular congestion. Mild infrahilar interstitial prominence.  Aortic arch atherosclerosis.  No definite pleural effusion.  No pneumothorax.  No acute osseous finding. Left shoulder degenerative change.  IMPRESSION: Cardiomegaly with central vascular congestion. Interstitial prominence may reflect pulmonary edema or atypical infection.   Original Report Authenticated By: Waneta Martins, M.D.     No diagnosis found.    MDM  Patient with c/o weakness following dialysis. Temperature 103. Does not make urine. Chest xray with possible atypical infection. Patient give IVF. PO fluids, tylenol, ibuprofen with good response. Patient feels better. Initiated antibiotics. Dx testing d/w pt and family.  Questions answered.  Verb understanding, agreeable to d/c home with outpt f/u. Pt feels improved after observation and/or treatment in ED.Pt stable in ED with no significant deterioration in condition.The patient appears reasonably screened and/or stabilized for discharge and I doubt any other medical condition or other [facility] requiring further screening, evaluation, or treatment in the  ED at this time prior to discharge.  MDM Reviewed: nursing note and vitals Interpretation: labs and x-ray            Nicoletta Dress. Colon Branch, MD 02/11/12 [phone]

## 2012-02-11 DIAGNOSIS — R0989 Other specified symptoms and signs involving the circulatory and respiratory systems: Secondary | ICD-10-CM | POA: Diagnosis not present

## 2012-02-11 LAB — COMPREHENSIVE METABOLIC PANEL
ALT: 9 U/L (ref 0–35)
Albumin: 3.1 g/dL — ABNORMAL LOW (ref 3.5–5.2)
Alkaline Phosphatase: 74 U/L (ref 39–117)
BUN: 22 mg/dL (ref 6–23)
Chloride: 89 mEq/L — ABNORMAL LOW (ref 96–112)
Glucose, Bld: 230 mg/dL — ABNORMAL HIGH (ref 70–99)
Potassium: 4.3 mEq/L (ref 3.5–5.1)
Total Bilirubin: 0.5 mg/dL (ref 0.3–1.2)

## 2012-02-11 LAB — CBC WITH DIFFERENTIAL/PLATELET
Basophils Relative: 0 % (ref 0–1)
Eosinophils Absolute: 0 10*3/uL (ref 0.0–0.7)
Hemoglobin: 9.8 g/dL — ABNORMAL LOW (ref 12.0–15.0)
Lymphs Abs: 0.7 10*3/uL (ref 0.7–4.0)
MCH: 27.4 pg (ref 26.0–34.0)
MCHC: 31.3 g/dL (ref 30.0–36.0)
Neutro Abs: 7.1 10*3/uL (ref 1.7–7.7)
Neutrophils Relative %: 87 % — ABNORMAL HIGH (ref 43–77)
Platelets: 182 10*3/uL (ref 150–400)
RBC: 3.58 MIL/uL — ABNORMAL LOW (ref 3.87–5.11)

## 2012-02-11 LAB — LACTIC ACID, PLASMA: Lactic Acid, Venous: 0.7 mmol/L (ref 0.5–2.2)

## 2012-02-11 LAB — PROCALCITONIN: Procalcitonin: 3.48 ng/mL

## 2012-02-11 MED ORDER — IBUPROFEN 400 MG PO TABS
400.0000 mg | ORAL_TABLET | Freq: Once | ORAL | Status: AC
  Administered 2012-02-11: 400 mg via ORAL
  Filled 2012-02-11: qty 1

## 2012-02-11 MED ORDER — MOXIFLOXACIN HCL 400 MG PO TABS: 400.0000 mg | ORAL_TABLET | Freq: Every day | ORAL | Status: AC

## 2012-02-11 MED ORDER — LEVOFLOXACIN 500 MG PO TABS
500.0000 mg | ORAL_TABLET | Freq: Once | ORAL | Status: AC
  Administered 2012-02-11: 500 mg via ORAL
  Filled 2012-02-11: qty 1

## 2012-02-11 MED ORDER — MOXIFLOXACIN HCL IN NACL 400 MG/250ML IV SOLN: 400.0000 mg | Freq: Once | INTRAVENOUS | Status: DC

## 2012-02-11 NOTE — ED Notes (Signed)
Lab called w/ a corrected procalcitonin result at 3.48 . EDP notified

## 2012-02-12 NOTE — Progress Notes (Signed)
Notified that blood cultures grew gram negative rods. Patient was discharged on Avelox which should cover organism.

## 2012-02-14 LAB — CULTURE, BLOOD (ROUTINE X 2)

## 2012-02-16 DIAGNOSIS — E119 Type 2 diabetes mellitus without complications: Secondary | ICD-10-CM | POA: Diagnosis not present

## 2012-02-16 DIAGNOSIS — N19 Unspecified kidney failure: Secondary | ICD-10-CM | POA: Diagnosis not present

## 2012-02-16 DIAGNOSIS — R5381 Other malaise: Secondary | ICD-10-CM | POA: Diagnosis not present

## 2012-02-16 DIAGNOSIS — E039 Hypothyroidism, unspecified: Secondary | ICD-10-CM | POA: Diagnosis not present

## 2012-02-16 LAB — CULTURE, BLOOD (ROUTINE X 2)

## 2012-02-26 ENCOUNTER — Encounter (HOSPITAL_COMMUNITY): Payer: Self-pay | Admitting: Emergency Medicine

## 2012-02-26 ENCOUNTER — Emergency Department (HOSPITAL_COMMUNITY): Payer: Medicare Other

## 2012-02-26 ENCOUNTER — Emergency Department (HOSPITAL_COMMUNITY)
Admission: EM | Admit: 2012-02-26 | Discharge: 2012-02-27 | Disposition: A | Discharge status: Home / Self Care | Payer: Medicare Other | Attending: Emergency Medicine | Admitting: Emergency Medicine

## 2012-02-26 DIAGNOSIS — E785 Hyperlipidemia, unspecified: Secondary | ICD-10-CM | POA: Diagnosis not present

## 2012-02-26 DIAGNOSIS — R7881 Bacteremia: Secondary | ICD-10-CM | POA: Diagnosis not present

## 2012-02-26 DIAGNOSIS — Z794 Long term (current) use of insulin: Secondary | ICD-10-CM | POA: Diagnosis not present

## 2012-02-26 DIAGNOSIS — F17200 Nicotine dependence, unspecified, uncomplicated: Secondary | ICD-10-CM | POA: Diagnosis not present

## 2012-02-26 DIAGNOSIS — J4489 Other specified chronic obstructive pulmonary disease: Secondary | ICD-10-CM | POA: Insufficient documentation

## 2012-02-26 DIAGNOSIS — L03114 Cellulitis of left upper limb: Secondary | ICD-10-CM

## 2012-02-26 DIAGNOSIS — T82898A Other specified complication of vascular prosthetic devices, implants and grafts, initial encounter: Secondary | ICD-10-CM | POA: Diagnosis not present

## 2012-02-26 DIAGNOSIS — R509 Fever, unspecified: Secondary | ICD-10-CM

## 2012-02-26 DIAGNOSIS — Z79899 Other long term (current) drug therapy: Secondary | ICD-10-CM | POA: Diagnosis not present

## 2012-02-26 DIAGNOSIS — I10 Essential (primary) hypertension: Secondary | ICD-10-CM | POA: Insufficient documentation

## 2012-02-26 DIAGNOSIS — E119 Type 2 diabetes mellitus without complications: Secondary | ICD-10-CM | POA: Insufficient documentation

## 2012-02-26 DIAGNOSIS — Z992 Dependence on renal dialysis: Secondary | ICD-10-CM | POA: Insufficient documentation

## 2012-02-26 DIAGNOSIS — N289 Disorder of kidney and ureter, unspecified: Secondary | ICD-10-CM | POA: Insufficient documentation

## 2012-02-26 DIAGNOSIS — E079 Disorder of thyroid, unspecified: Secondary | ICD-10-CM | POA: Insufficient documentation

## 2012-02-26 DIAGNOSIS — L03211 Cellulitis of face: Secondary | ICD-10-CM | POA: Insufficient documentation

## 2012-02-26 DIAGNOSIS — J449 Chronic obstructive pulmonary disease, unspecified: Secondary | ICD-10-CM | POA: Insufficient documentation

## 2012-02-26 DIAGNOSIS — L0201 Cutaneous abscess of face: Secondary | ICD-10-CM | POA: Insufficient documentation

## 2012-02-26 DIAGNOSIS — IMO0002 Reserved for concepts with insufficient information to code with codable children: Secondary | ICD-10-CM | POA: Diagnosis not present

## 2012-02-26 DIAGNOSIS — R0989 Other specified symptoms and signs involving the circulatory and respiratory systems: Secondary | ICD-10-CM | POA: Diagnosis not present

## 2012-02-26 MED ORDER — VANCOMYCIN HCL IN DEXTROSE 1-5 GM/200ML-% IV SOLN
1000.0000 mg | Freq: Once | INTRAVENOUS | Status: AC
  Administered 2012-02-27: 1000 mg via INTRAVENOUS

## 2012-02-26 MED ORDER — PIPERACILLIN-TAZOBACTAM 3.375 G IVPB
3.3750 g | Freq: Once | INTRAVENOUS | Status: AC
  Administered 2012-02-27: 3.375 g via INTRAVENOUS
  Filled 2012-02-26: qty 50

## 2012-02-26 MED ORDER — ACETAMINOPHEN 325 MG PO TABS
975.0000 mg | ORAL_TABLET | Freq: Once | ORAL | Status: AC
  Administered 2012-02-27: 975 mg via ORAL
  Filled 2012-02-26: qty 3

## 2012-02-26 NOTE — ED Provider Notes (Signed)
History  This chart was scribed for Sheila Co, MD by Erskine Emery. This patient was seen in room APA12/APA12 and the patient's care was started at 23:31.   CSN: [id number]  Arrival date & time 02/26/12  2306   First MD Initiated Contact with Patient 02/26/12 2331      Chief Complaint  Patient presents with  . Shaking  . Chills    The history is provided by the patient and a relative. No language interpreter was used.  [name] is a 66 y.o. female who presents to the Emergency Department complaining of fever (103.0 in Triage), chills, nausea, and dry heaving since this afternoon as well as a cough since yesterday. Pt denies any associated emesis, diarrhea, congestion, SOB, dysuria, or abdominal pain and she knows of no sick contacts. Pt also reports an infected fistula in her left arm for the past 3 weeks, since a new shunt placement. Pt is a dialysis (Monday, Wednesday, Friday) and has recently been receiving IV antibiotics at every treatment. Pt still makes urine. Pt has a catheter placed in her right chest.  Dr. Festus Barren in [place] is the pt's vascular surgeon. Pt gets her dialysis on [address] in [place]. [facility] follows the pt for her kidney issues.  Past Medical History  Diagnosis Date  . Renal disorder   . Dialysis care     M-W-F dialysis at [facility]  . COPD (chronic obstructive pulmonary disease)   . Diabetes mellitus   . Hypertension   . Thyroid disease   . Hyperlipidemia   . Bronchitis   . Leg pain     Past Surgical History  Procedure Date  . Cholecystectomy   . Dg av dialysis shunt access exist*r* or     working right HD catheter  . Av fistula placement 05/18/2010    History reviewed. No pertinent family history.  History  Substance Use Topics  . Smoking status: Current Every Day Smoker -- 0.5 packs/day for 30 years    Types: Cigarettes  . Smokeless tobacco: Not on file  . Alcohol Use: No    OB  History    Grav Para Term Preterm Abortions TAB SAB Ect Mult Living            2      Review of Systems A complete 10 system review of systems was obtained and all systems are negative except as noted in the HPI and PMH.    Allergies  Contrast media  Home Medications   Current Outpatient Rx  Name Route Sig Dispense Refill  . ALBUTEROL SULFATE HFA 108 (90 BASE) MCG/ACT IN AERS Inhalation Inhale 2 puffs into the lungs every 6 (six) hours as needed. For shortness of breath     . AMLODIPINE BESYLATE 5 MG PO TABS Oral Take 5 mg by mouth daily.      . ASPIRIN EC 81 MG PO TBEC Oral Take 81 mg by mouth daily.      . ATORVASTATIN CALCIUM 40 MG PO TABS Oral Take 40 mg by mouth daily.      Marland Kitchen VOL-CARE RX 1 MG PO TABS Oral Take 1 tablet by mouth daily.      Marland Kitchen DOXAZOSIN MESYLATE 8 MG PO TABS Oral Take 8 mg by mouth at bedtime.      . OMEGA-3 FATTY ACIDS 1000 MG PO CAPS Oral Take 2 g by mouth daily.      Marland Kitchen GLIPIZIDE 10 MG PO TABS  Oral Take 10 mg by mouth 2 (two) times daily before a meal.      . GUAIFENESIN-CODEINE 100-10 MG/5ML PO SYRP Oral Take 5 mLs by mouth 4 (four) times daily as needed. For coughing      . INSULIN LISPRO PROT & LISPRO (75-25) 100 UNIT/ML [place] SUSP Subcutaneous Inject 15-35 Units into the skin. Patient takes 35 units every morning and 15 units in the evening     . LEVOTHYROXINE SODIUM 75 MCG PO TABS Oral Take 75 mcg by mouth daily.      Marland Kitchen NIACIN ER (ANTIHYPERLIPIDEMIC) 500 MG PO TBCR Oral Take 500 mg by mouth at bedtime.        There were no vitals taken for this visit.  Physical Exam  Nursing note and vitals reviewed. Constitutional: [name] oriented to person, place, and time. She appears well-developed and well-nourished.  HENT:  Head: Normocephalic.  Eyes: EOM are normal.  Neck: Normal range of motion.  Cardiovascular: Normal rate, regular rhythm and normal heart sounds.   Pulmonary/Chest: Effort normal.       Right subclavian vascular catheter with no signs of  surrounding infection.  Abdominal: Soft. She exhibits no distension. There is no tenderness.  Musculoskeletal: Normal range of motion.  Neurological: [name] alert and oriented to person, place, and time.  Skin:       Left anterior forearm fistula is warm and shows evidence of good thrill. There's surrounding erythema and possiblity of some fluctuants.  Psychiatric: She has a normal mood and affect.    ED Course  Procedures (including critical care time) DIAGNOSTIC STUDIES: Oxygen Saturation is 91% on room air, adequate by my interpretation.    COORDINATION OF CARE: 23:40--I evaluated the patient and we discussed a treatment plan including antibiotics, Tylenol, a chest x-ray, and urinalysis to which the pt and her family agreed. I informed the family that the pt needs to be hospitalized, preferably at [facility].    Labs Reviewed  GLUCOSE, CAPILLARY - Abnormal; Notable for the following:    Glucose-Capillary 327 (*)     All other components within normal limits  CBC WITH DIFFERENTIAL - Abnormal; Notable for the following:    RBC 3.28 (*)     Hemoglobin 8.9 (*)     HCT 29.2 (*)     RDW 20.7 (*)     Neutrophils Relative 94 (*)     Lymphocytes Relative 4 (*)     Lymphs Abs 0.3 (*)     All other components within normal limits  BASIC METABOLIC PANEL - Abnormal; Notable for the following:    Sodium 130 (*)     Chloride 87 (*)     Glucose, Bld 400 (*)     BUN 60 (*)     Creatinine, Ser 8.53 (*)     GFR calc non Af Amer 4 (*)     GFR calc Af Amer 5 (*)     All other components within normal limits  LACTIC ACID, PLASMA - Abnormal; Notable for the following:    Lactic Acid, Venous 2.4 (*)     All other components within normal limits  URINALYSIS, ROUTINE W REFLEX MICROSCOPIC - Abnormal; Notable for the following:    Glucose, UA 250 (*)     Hgb urine dipstick SMALL (*)     Protein, ur 100 (*)     Leukocytes, UA SMALL (*)     All other components within normal limits  URINE  MICROSCOPIC-ADD ON -  Abnormal; Notable for the following:    Squamous Epithelial / LPF FEW (*)     Bacteria, UA FEW (*)     All other components within normal limits  TROPONIN I  CULTURE, BLOOD (ROUTINE X 2)  CULTURE, BLOOD (ROUTINE X 2)  URINE CULTURE   Dg Chest 2 View  02/27/2012  *RADIOLOGY REPORT*  Clinical Data: Shaking and chills.  CHEST - 2 VIEW  Comparison: 02/11/2012  Findings: 0108 hours.  Lung volumes are low. The cardiopericardial silhouette is enlarged. There is pulmonary vascular congestion without overt pulmonary edema.  The right IJ dialysis catheter has its distal tip at the distal SVC level.  The proximal lumen has a somewhat angled configuration but on the lateral film does not project posteriorly to suggest that it is in the azygos vein. Bones are diffusely demineralized.  IMPRESSION: Low volume film with cardiomegaly and vascular congestion.  No substantial interval change.   Original Report Authenticated By: ERIC A. MANSELL, M.D.     I personally reviewed the imaging tests through PACS system  I reviewed available ER/hospitalization records thought the EMR   1. Cellulitis of left forearm   2. Fever       MDM  Question cellulitis versus abscess of her pseudoaneurysm of her recent left AV fistula.  The patient is well-appearing nontoxic.  We'll obtain labs.   her case will need to be discussed with her vascular surgeon.  vancomycin and Zosyn given.blood cultures pending  1:47 AM Spoke with Dr Wyn Quaker, her vascular surgeon who would like to see her in his office in the morning and she will call the office at 8am. She has been given vanc and zosyn now. [name] nontoxic. Her WBC count is normal. Repeat vitals normal except for her fever which is improving. Follow up is in approx 6 hours. i think this is reasonable. The pt will be kept NPO.   I personally performed the services described in this documentation, which was scribed in my presence. The recorded information has  been reviewed and considered.      Sheila Co, MD 02/27/12 [phone]

## 2012-02-26 NOTE — ED Notes (Signed)
Patient complaining of shaking and chills that started tonight. Dialysis patient, elevated blood glucose.

## 2012-02-27 ENCOUNTER — Encounter (HOSPITAL_COMMUNITY): Payer: Self-pay | Admitting: *Deleted

## 2012-02-27 ENCOUNTER — Emergency Department (HOSPITAL_COMMUNITY)
Admission: EM | Admit: 2012-02-27 | Discharge: 2012-02-28 | Disposition: A | Discharge status: Home / Self Care | Payer: Medicare Other | Attending: Emergency Medicine | Admitting: Emergency Medicine

## 2012-02-27 ENCOUNTER — Telehealth (HOSPITAL_COMMUNITY): Payer: Self-pay | Admitting: *Deleted

## 2012-02-27 DIAGNOSIS — Y849 Medical procedure, unspecified as the cause of abnormal reaction of the patient, or of later complication, without mention of misadventure at the time of the procedure: Secondary | ICD-10-CM | POA: Insufficient documentation

## 2012-02-27 DIAGNOSIS — T829XXA Unspecified complication of cardiac and vascular prosthetic device, implant and graft, initial encounter: Secondary | ICD-10-CM

## 2012-02-27 DIAGNOSIS — F17200 Nicotine dependence, unspecified, uncomplicated: Secondary | ICD-10-CM | POA: Diagnosis not present

## 2012-02-27 DIAGNOSIS — T82898A Other specified complication of vascular prosthetic devices, implants and grafts, initial encounter: Secondary | ICD-10-CM | POA: Insufficient documentation

## 2012-02-27 DIAGNOSIS — E119 Type 2 diabetes mellitus without complications: Secondary | ICD-10-CM | POA: Diagnosis not present

## 2012-02-27 DIAGNOSIS — R7881 Bacteremia: Secondary | ICD-10-CM

## 2012-02-27 DIAGNOSIS — Z79899 Other long term (current) drug therapy: Secondary | ICD-10-CM | POA: Diagnosis not present

## 2012-02-27 DIAGNOSIS — J449 Chronic obstructive pulmonary disease, unspecified: Secondary | ICD-10-CM | POA: Insufficient documentation

## 2012-02-27 DIAGNOSIS — R509 Fever, unspecified: Secondary | ICD-10-CM | POA: Insufficient documentation

## 2012-02-27 DIAGNOSIS — N289 Disorder of kidney and ureter, unspecified: Secondary | ICD-10-CM | POA: Diagnosis not present

## 2012-02-27 DIAGNOSIS — I10 Essential (primary) hypertension: Secondary | ICD-10-CM | POA: Diagnosis not present

## 2012-02-27 DIAGNOSIS — R110 Nausea: Secondary | ICD-10-CM | POA: Insufficient documentation

## 2012-02-27 DIAGNOSIS — E079 Disorder of thyroid, unspecified: Secondary | ICD-10-CM | POA: Diagnosis not present

## 2012-02-27 DIAGNOSIS — J4489 Other specified chronic obstructive pulmonary disease: Secondary | ICD-10-CM | POA: Insufficient documentation

## 2012-02-27 DIAGNOSIS — N189 Chronic kidney disease, unspecified: Secondary | ICD-10-CM | POA: Insufficient documentation

## 2012-02-27 DIAGNOSIS — Z992 Dependence on renal dialysis: Secondary | ICD-10-CM | POA: Diagnosis not present

## 2012-02-27 DIAGNOSIS — R0989 Other specified symptoms and signs involving the circulatory and respiratory systems: Secondary | ICD-10-CM | POA: Diagnosis not present

## 2012-02-27 DIAGNOSIS — Z794 Long term (current) use of insulin: Secondary | ICD-10-CM | POA: Diagnosis not present

## 2012-02-27 DIAGNOSIS — N186 End stage renal disease: Secondary | ICD-10-CM | POA: Diagnosis not present

## 2012-02-27 DIAGNOSIS — E785 Hyperlipidemia, unspecified: Secondary | ICD-10-CM | POA: Diagnosis not present

## 2012-02-27 DIAGNOSIS — L0201 Cutaneous abscess of face: Secondary | ICD-10-CM | POA: Diagnosis not present

## 2012-02-27 LAB — BASIC METABOLIC PANEL
BUN: 60 mg/dL — ABNORMAL HIGH (ref 6–23)
Calcium: 8.4 mg/dL (ref 8.4–10.5)
Creatinine, Ser: 8.53 mg/dL — ABNORMAL HIGH (ref 0.50–1.10)
GFR calc Af Amer: 5 mL/min — ABNORMAL LOW (ref >90)
GFR calc non Af Amer: 4 mL/min — ABNORMAL LOW (ref >90)
Glucose, Bld: 400 mg/dL — ABNORMAL HIGH (ref 70–99)

## 2012-02-27 LAB — CBC WITH DIFFERENTIAL/PLATELET
Basophils Relative: 0 % (ref 0–1)
Eosinophils Absolute: 0 10*3/uL (ref 0.0–0.7)
Eosinophils Relative: 0 % (ref 0–5)
HCT: 29.2 % — ABNORMAL LOW (ref 36.0–46.0)
Hemoglobin: 8.9 g/dL — ABNORMAL LOW (ref 12.0–15.0)
Lymphs Abs: 0.3 10*3/uL — ABNORMAL LOW (ref 0.7–4.0)
MCH: 27.1 pg (ref 26.0–34.0)
MCHC: 30.5 g/dL (ref 30.0–36.0)
MCV: 89 fL (ref 78.0–100.0)
Monocytes Absolute: 0.2 10*3/uL (ref 0.1–1.0)
Monocytes Relative: 3 % (ref 3–12)
Neutrophils Relative %: 94 % — ABNORMAL HIGH (ref 43–77)

## 2012-02-27 LAB — LACTIC ACID, PLASMA: Lactic Acid, Venous: 2.4 mmol/L — ABNORMAL HIGH (ref 0.5–2.2)

## 2012-02-27 LAB — URINALYSIS, ROUTINE W REFLEX MICROSCOPIC
Bilirubin Urine: NEGATIVE
Nitrite: NEGATIVE
Protein, ur: 100 mg/dL — AB
Specific Gravity, Urine: 1.015 (ref 1.005–1.030)
Urobilinogen, UA: 0.2 mg/dL (ref 0.0–1.0)

## 2012-02-27 LAB — URINE MICROSCOPIC-ADD ON

## 2012-02-27 MED ORDER — VANCOMYCIN HCL IN DEXTROSE 1-5 GM/200ML-% IV SOLN
INTRAVENOUS | Status: AC
  Filled 2012-02-27: qty 200

## 2012-02-27 NOTE — ED Notes (Signed)
Pt presents to ED with generalized weakness, was treated here yesterday and was called asking her to return to ED secondary to positive blood cultures. Pt denies pain at this time. Remains afebrile. Pt has noted swelling and redness in left antecubital area after dialysis shunt placed 3 weeks prior. Pt reports has had dialysis today via "port in chest".  NAD noted. Family at bedside.

## 2012-02-27 NOTE — ED Notes (Addendum)
  Gram - rods  Blood Culture called from [facility] to [place] [facility]. Marland Kitchen Report faxed to [facility].

## 2012-02-27 NOTE — ED Notes (Addendum)
Seen here yesterday, called to return for positive blood culture.  Pt had cellulitis of lt arm at graft site

## 2012-02-28 ENCOUNTER — Inpatient Hospital Stay: Payer: Self-pay | Admitting: Internal Medicine

## 2012-02-28 DIAGNOSIS — Z833 Family history of diabetes mellitus: Secondary | ICD-10-CM | POA: Diagnosis not present

## 2012-02-28 DIAGNOSIS — N2581 Secondary hyperparathyroidism of renal origin: Secondary | ICD-10-CM | POA: Diagnosis not present

## 2012-02-28 DIAGNOSIS — E669 Obesity, unspecified: Secondary | ICD-10-CM | POA: Diagnosis present

## 2012-02-28 DIAGNOSIS — E119 Type 2 diabetes mellitus without complications: Secondary | ICD-10-CM | POA: Diagnosis not present

## 2012-02-28 DIAGNOSIS — Z79899 Other long term (current) drug therapy: Secondary | ICD-10-CM | POA: Diagnosis not present

## 2012-02-28 DIAGNOSIS — D696 Thrombocytopenia, unspecified: Secondary | ICD-10-CM | POA: Diagnosis present

## 2012-02-28 DIAGNOSIS — D631 Anemia in chronic kidney disease: Secondary | ICD-10-CM | POA: Diagnosis present

## 2012-02-28 DIAGNOSIS — T82898A Other specified complication of vascular prosthetic devices, implants and grafts, initial encounter: Secondary | ICD-10-CM | POA: Diagnosis not present

## 2012-02-28 DIAGNOSIS — Z992 Dependence on renal dialysis: Secondary | ICD-10-CM | POA: Diagnosis not present

## 2012-02-28 DIAGNOSIS — R7881 Bacteremia: Secondary | ICD-10-CM | POA: Diagnosis not present

## 2012-02-28 DIAGNOSIS — N039 Chronic nephritic syndrome with unspecified morphologic changes: Secondary | ICD-10-CM | POA: Diagnosis not present

## 2012-02-28 DIAGNOSIS — I10 Essential (primary) hypertension: Secondary | ICD-10-CM | POA: Diagnosis not present

## 2012-02-28 DIAGNOSIS — R110 Nausea: Secondary | ICD-10-CM | POA: Diagnosis not present

## 2012-02-28 DIAGNOSIS — Q279 Congenital malformation of peripheral vascular system, unspecified: Secondary | ICD-10-CM | POA: Diagnosis not present

## 2012-02-28 DIAGNOSIS — E039 Hypothyroidism, unspecified: Secondary | ICD-10-CM | POA: Diagnosis not present

## 2012-02-28 DIAGNOSIS — B965 Pseudomonas (aeruginosa) (mallei) (pseudomallei) as the cause of diseases classified elsewhere: Secondary | ICD-10-CM | POA: Diagnosis present

## 2012-02-28 DIAGNOSIS — J449 Chronic obstructive pulmonary disease, unspecified: Secondary | ICD-10-CM | POA: Diagnosis present

## 2012-02-28 DIAGNOSIS — R509 Fever, unspecified: Secondary | ICD-10-CM | POA: Diagnosis not present

## 2012-02-28 DIAGNOSIS — T827XXA Infection and inflammatory reaction due to other cardiac and vascular devices, implants and grafts, initial encounter: Secondary | ICD-10-CM | POA: Diagnosis not present

## 2012-02-28 DIAGNOSIS — N189 Chronic kidney disease, unspecified: Secondary | ICD-10-CM | POA: Diagnosis not present

## 2012-02-28 DIAGNOSIS — Z7982 Long term (current) use of aspirin: Secondary | ICD-10-CM | POA: Diagnosis not present

## 2012-02-28 DIAGNOSIS — F17200 Nicotine dependence, unspecified, uncomplicated: Secondary | ICD-10-CM | POA: Diagnosis present

## 2012-02-28 DIAGNOSIS — Z794 Long term (current) use of insulin: Secondary | ICD-10-CM | POA: Diagnosis not present

## 2012-02-28 DIAGNOSIS — A419 Sepsis, unspecified organism: Secondary | ICD-10-CM | POA: Diagnosis not present

## 2012-02-28 DIAGNOSIS — I120 Hypertensive chronic kidney disease with stage 5 chronic kidney disease or end stage renal disease: Secondary | ICD-10-CM | POA: Diagnosis present

## 2012-02-28 DIAGNOSIS — N390 Urinary tract infection, site not specified: Secondary | ICD-10-CM | POA: Diagnosis present

## 2012-02-28 DIAGNOSIS — N186 End stage renal disease: Secondary | ICD-10-CM | POA: Diagnosis not present

## 2012-02-28 LAB — CBC WITH DIFFERENTIAL/PLATELET
Eosinophils Absolute: 0 10*3/uL (ref 0.0–0.7)
HCT: 29.5 % — ABNORMAL LOW (ref 36.0–46.0)
Hemoglobin: 9.1 g/dL — ABNORMAL LOW (ref 12.0–15.0)
Lymphs Abs: 1.3 10*3/uL (ref 0.7–4.0)
MCH: 27.2 pg (ref 26.0–34.0)
Monocytes Absolute: 0.4 10*3/uL (ref 0.1–1.0)
Monocytes Relative: 8 % (ref 3–12)
Neutro Abs: 2.8 10*3/uL (ref 1.7–7.7)
Neutrophils Relative %: 63 % (ref 43–77)
RBC: 3.35 MIL/uL — ABNORMAL LOW (ref 3.87–5.11)

## 2012-02-28 LAB — URINE CULTURE: Culture: NO GROWTH

## 2012-02-28 LAB — BASIC METABOLIC PANEL
BUN: 32 mg/dL — ABNORMAL HIGH (ref 6–23)
Chloride: 95 mEq/L — ABNORMAL LOW (ref 96–112)
GFR calc non Af Amer: 7 mL/min — ABNORMAL LOW (ref >90)
Glucose, Bld: 56 mg/dL — ABNORMAL LOW (ref 70–99)
Potassium: 3.6 mEq/L (ref 3.5–5.1)
Sodium: 137 mEq/L (ref 135–145)

## 2012-02-28 LAB — PLATELET COUNT: Platelet: 132 10*3/uL — ABNORMAL LOW (ref 150–440)

## 2012-02-28 MED ORDER — VANCOMYCIN HCL IN DEXTROSE 1-5 GM/200ML-% IV SOLN
1000.0000 mg | Freq: Once | INTRAVENOUS | Status: AC
  Administered 2012-02-28: 1000 mg via INTRAVENOUS
  Filled 2012-02-28: qty 200

## 2012-02-28 MED ORDER — PIPERACILLIN-TAZOBACTAM 3.375 G IVPB
3.3750 g | Freq: Once | INTRAVENOUS | Status: AC
Start: 2012-02-28 — End: 2012-02-28
  Administered 2012-02-28: 3.375 g via INTRAVENOUS
  Filled 2012-02-28: qty 50

## 2012-02-28 NOTE — ED Provider Notes (Signed)
Medical screening examination/treatment/procedure(s) were conducted as a shared visit with non-physician practitioner(s) and myself.  I personally evaluated the patient during the encounter  Dialysis fistula left proximal forearm with pulse, thrill, tenderness, warmth and erythema. Awaiting transport to [facility].  Hanley Seamen, MD 02/28/12 [phone]

## 2012-02-28 NOTE — ED Provider Notes (Signed)
History     CSN: [id number]  Arrival date & time 02/27/12  2054   First MD Initiated Contact with Patient 02/27/12 2320      Chief Complaint  Patient presents with  . Weakness    (Consider location/radiation/quality/duration/timing/severity/associated sxs/prior treatment) HPI Comments: [name] presents with generalized weakness,  Fever to 103.0 yesterday and anorexia.  She did have nausea with dry heaves yesterday which has improved today.  She denies vomiting, diarrhea, shortness of breath and abdominal pain.  She was seen here last evening for the same symptoms at which time it was felt she may have an infected fistula at her new left antecubital shunt (3 weeks ago by vascular surgeon Dr. Wyn Quaker in [place]).  She saw him this am at which time plans for revision of this graft was scheduled for October 2 (Wednesday).  However,  Her blood cultures drawn last night have both come up positive for gram neg rods and she was contacted to return here for further treatment.  She did receive IV vanc and zosyn during her ed stay here last night and denies fevers today, but has felt increasingly weak with persistent anorexia.  She also has a catheter in her right chest for which she obtains dialysis on Monday, Wednesday and Friday.  The history is provided by the patient.    Past Medical History  Diagnosis Date  . Dialysis care     M-W-F dialysis at [facility]  . COPD (chronic obstructive pulmonary disease)   . Diabetes mellitus   . Hypertension   . Thyroid disease   . Hyperlipidemia   . Bronchitis   . Leg pain   . Renal disorder     Past Surgical History  Procedure Date  . Cholecystectomy   . Dg av dialysis shunt access exist*r* or     working right HD catheter  . Av fistula placement 05/18/2010    History reviewed. No pertinent family history.  History  Substance Use Topics  . Smoking status: Current Every Day Smoker -- 0.5 packs/day for 30 years    Types:  Cigarettes  . Smokeless tobacco: Not on file  . Alcohol Use: No    OB History    Grav Para Term Preterm Abortions TAB SAB Ect Mult Living            2      Review of Systems  Constitutional: Positive for fever and fatigue.  HENT: Negative for congestion, sore throat and neck pain.   Eyes: Negative.   Respiratory: Negative for chest tightness and shortness of breath.   Cardiovascular: Negative for chest pain.  Gastrointestinal: Negative for nausea and abdominal pain.  Genitourinary: Negative.   Musculoskeletal: Negative for joint swelling and arthralgias.  Skin: Negative for rash and wound.  Neurological: Positive for weakness. Negative for dizziness, light-headedness, numbness and headaches.  Hematological: Negative.   Psychiatric/Behavioral: Negative.     Allergies  Contrast media  Home Medications   Current Outpatient Rx  Name Route Sig Dispense Refill  . DOXAZOSIN MESYLATE 8 MG PO TABS Oral Take 8 mg by mouth at bedtime.      Marland Kitchen GLIPIZIDE 10 MG PO TABS Oral Take 10 mg by mouth 2 (two) times daily before a meal.      . INSULIN LISPRO PROT & LISPRO (75-25) 100 UNIT/ML [place] SUSP Subcutaneous Inject 15-35 Units into the skin. Patient takes 35 units every morning and 15 units in the evening     .  LEVOTHYROXINE SODIUM 75 MCG PO TABS Oral Take 75 mcg by mouth daily.      Marland Kitchen LISINOPRIL 20 MG PO TABS Oral Take 1 mg by mouth daily.    Marland Kitchen RENA-VITE PO TABS Oral Take 1 tablet by mouth daily.    Marland Kitchen NIACIN ER (ANTIHYPERLIPIDEMIC) 500 MG PO TBCR Oral Take 500 mg by mouth at bedtime.        BP 123/54  Pulse 81  Temp 98.7 F (37.1 C) (Oral)  Resp 18  Ht 5\' 3"  (1.6 m)  Wt 175 lb (79.379 kg)  BMI 31.00 kg/m2  SpO2 91%  Physical Exam  Nursing note and vitals reviewed. Constitutional: She appears well-developed and well-nourished.  HENT:  Head: Normocephalic and atraumatic.  Eyes: Conjunctivae normal are normal.  Neck: Normal range of motion.  Cardiovascular: Normal rate, regular  rhythm, normal heart sounds and intact distal pulses.   Pulmonary/Chest: Effort normal and breath sounds normal. She has no wheezes.       Right subclavian  Catheter clean,  No surrounding erythema.  Abdominal: Soft. Bowel sounds are normal. There is no tenderness.  Musculoskeletal: Normal range of motion.  Neurological: [name] alert.  Skin: Skin is warm and dry.       Left anterior forearm fistula warm, indurated, thrill present, mild surrounding erythema,    Psychiatric: She has a normal mood and affect.    ED Course  Procedures (including critical care time)  Labs Reviewed  BASIC METABOLIC PANEL - Abnormal; Notable for the following:    Chloride 95 (*)  DELTA CHECK NOTED   Glucose, Bld 56 (*)     BUN 32 (*)  DELTA CHECK NOTED   Creatinine, Ser 5.89 (*)     GFR calc non Af Amer 7 (*)     GFR calc Af Amer 8 (*)     All other components within normal limits  CBC WITH DIFFERENTIAL - Abnormal; Notable for the following:    RBC 3.35 (*)     Hemoglobin 9.1 (*)     HCT 29.5 (*)     RDW 20.7 (*)     Platelets 146 (*)     All other components within normal limits   Dg Chest 2 View  02/27/2012  *RADIOLOGY REPORT*  Clinical Data: Shaking and chills.  CHEST - 2 VIEW  Comparison: 02/11/2012  Findings: 0108 hours.  Lung volumes are low. The cardiopericardial silhouette is enlarged. There is pulmonary vascular congestion without overt pulmonary edema.  The right IJ dialysis catheter has its distal tip at the distal SVC level.  The proximal lumen has a somewhat angled configuration but on the lateral film does not project posteriorly to suggest that it is in the azygos vein. Bones are diffusely demineralized.  IMPRESSION: Low volume film with cardiomegaly and vascular congestion.  No substantial interval change.   Original Report Authenticated By: ERIC A. MANSELL, M.D.      No diagnosis found.  Spoke with Dr Rito Ehrlich with [facility] - felt pt would be better candidate for transfer to  [place] since bacteremia appears to be a vascular shunt related issue.  2:17 AM Spoke with Dr. Wyn Quaker with vascular surgery - [place].  Agrees with admission at [facility] given pt need for shunt intervention.  Doubts shunt is infected given gram neg rods on culture.  May need to have catheter removed.  Recommends pt come to [place] ed. May need medical admission.    2:25 AM Spoke with Dr. Dolores Frame with  [place] ed - agrees with need for transfer.  Arrangements pending.  3:20 AM Spoke with Dr. Randol Kern with Hospitalist group at [facility].  Accepts pt for transfer.  MDM  Bacteremia with complication of newly placed left antecubital shunt,  Pyuria per UA completed at visit 2 days ago,  Also with longstanding dialysis catheter.  Pt to be transferred to [facility] for medical care and anticipated left shunt revision.        Burgess Amor, [place] 02/28/12 [phone]

## 2012-02-29 ENCOUNTER — Ambulatory Visit: Payer: Self-pay | Admitting: Vascular Surgery

## 2012-02-29 LAB — COMPREHENSIVE METABOLIC PANEL
Albumin: 2.6 g/dL — ABNORMAL LOW (ref 3.4–5.0)
Alkaline Phosphatase: 98 U/L (ref 50–136)
Anion Gap: 14 (ref 7–16)
BUN: 48 mg/dL — ABNORMAL HIGH (ref 7–18)
Bilirubin,Total: 0.6 mg/dL (ref 0.2–1.0)
Calcium, Total: 7.8 mg/dL — ABNORMAL LOW (ref 8.5–10.1)
Chloride: 95 mmol/L — ABNORMAL LOW (ref 98–107)
Co2: 27 mmol/L (ref 21–32)
Creatinine: 8.15 mg/dL — ABNORMAL HIGH (ref 0.60–1.30)
EGFR (African American): 5 — ABNORMAL LOW
EGFR (Non-African Amer.): 5 — ABNORMAL LOW
Glucose: 84 mg/dL (ref 65–99)
Osmolality: 284 (ref 275–301)
Potassium: 3.9 mmol/L (ref 3.5–5.1)
SGOT(AST): 44 U/L — ABNORMAL HIGH (ref 15–37)
SGPT (ALT): 40 U/L (ref 12–78)
Sodium: 136 mmol/L (ref 136–145)
Total Protein: 6.8 g/dL (ref 6.4–8.2)

## 2012-02-29 LAB — CBC WITH DIFFERENTIAL/PLATELET
Basophil #: 0 10*3/uL (ref 0.0–0.1)
Basophil #: 0 10*3/uL (ref 0.0–0.1)
Basophil %: 0.5 %
Basophil %: 0.9 %
Eosinophil #: 0.2 10*3/uL (ref 0.0–0.7)
Eosinophil #: 0.1 10*3/uL (ref 0.0–0.7)
Eosinophil %: 3.2 %
Eosinophil %: 3.1 %
HCT: 27.2 % — ABNORMAL LOW (ref 35.0–47.0)
HCT: 24.9 % — ABNORMAL LOW (ref 35.0–47.0)
HGB: 8.9 g/dL — ABNORMAL LOW (ref 12.0–16.0)
HGB: 7.9 g/dL — ABNORMAL LOW (ref 12.0–16.0)
Lymphocyte #: 1.4 10*3/uL (ref 1.0–3.6)
Lymphocyte #: 1.4 10*3/uL (ref 1.0–3.6)
Lymphocyte %: 29.6 %
Lymphocyte %: 36.1 %
MCH: 28.4 pg (ref 26.0–34.0)
MCH: 27.6 pg (ref 26.0–34.0)
MCHC: 32.9 g/dL (ref 32.0–36.0)
MCHC: 31.9 g/dL — ABNORMAL LOW (ref 32.0–36.0)
MCV: 86 fL (ref 80–100)
MCV: 87 fL (ref 80–100)
Monocyte #: 0.7 x10 3/mm (ref 0.2–0.9)
Monocyte #: 0.6 x10 3/mm (ref 0.2–0.9)
Monocyte %: 15.2 %
Monocyte %: 14.3 %
Neutrophil #: 2.5 10*3/uL (ref 1.4–6.5)
Neutrophil #: 1.8 10*3/uL (ref 1.4–6.5)
Neutrophil %: 51.5 %
Neutrophil %: 45.6 %
Platelet: 125 10*3/uL — ABNORMAL LOW (ref 150–440)
Platelet: 125 10*3/uL — ABNORMAL LOW (ref 150–440)
RBC: 3.15 10*6/uL — ABNORMAL LOW (ref 3.80–5.20)
RBC: 2.87 10*6/uL — ABNORMAL LOW (ref 3.80–5.20)
RDW: 21.1 % — ABNORMAL HIGH (ref 11.5–14.5)
RDW: 20.7 % — ABNORMAL HIGH (ref 11.5–14.5)
WBC: 4.8 10*3/uL (ref 3.6–11.0)
WBC: 3.9 10*3/uL (ref 3.6–11.0)

## 2012-03-01 LAB — CULTURE, BLOOD (ROUTINE X 2)

## 2012-03-02 LAB — CBC WITH DIFFERENTIAL/PLATELET
Basophil #: 0.1 10*3/uL (ref 0.0–0.1)
Eosinophil #: 0.3 10*3/uL (ref 0.0–0.7)
Lymphocyte #: 1.5 10*3/uL (ref 1.0–3.6)
MCH: 27 pg (ref 26.0–34.0)
MCHC: 31.4 g/dL — ABNORMAL LOW (ref 32.0–36.0)
Monocyte #: 0.6 x10 3/mm (ref 0.2–0.9)
Neutrophil %: 44.5 %
Platelet: 136 10*3/uL — ABNORMAL LOW (ref 150–440)
RBC: 2.87 10*6/uL — ABNORMAL LOW (ref 3.80–5.20)
RDW: 20.3 % — ABNORMAL HIGH (ref 11.5–14.5)

## 2012-03-04 LAB — WOUND CULTURE

## 2012-03-05 LAB — PHOSPHORUS: Phosphorus: 5.1 mg/dL — ABNORMAL HIGH (ref 2.5–4.9)

## 2012-03-07 DIAGNOSIS — D631 Anemia in chronic kidney disease: Secondary | ICD-10-CM | POA: Diagnosis not present

## 2012-03-07 DIAGNOSIS — N2581 Secondary hyperparathyroidism of renal origin: Secondary | ICD-10-CM | POA: Diagnosis not present

## 2012-03-07 DIAGNOSIS — N186 End stage renal disease: Secondary | ICD-10-CM | POA: Diagnosis not present

## 2012-03-07 DIAGNOSIS — D509 Iron deficiency anemia, unspecified: Secondary | ICD-10-CM | POA: Diagnosis not present

## 2012-03-07 LAB — CULTURE, BLOOD (SINGLE)

## 2012-03-09 DIAGNOSIS — N2581 Secondary hyperparathyroidism of renal origin: Secondary | ICD-10-CM | POA: Diagnosis not present

## 2012-03-09 DIAGNOSIS — D631 Anemia in chronic kidney disease: Secondary | ICD-10-CM | POA: Diagnosis not present

## 2012-03-09 DIAGNOSIS — N186 End stage renal disease: Secondary | ICD-10-CM | POA: Diagnosis not present

## 2012-03-09 DIAGNOSIS — D509 Iron deficiency anemia, unspecified: Secondary | ICD-10-CM | POA: Diagnosis not present

## 2012-03-12 DIAGNOSIS — N039 Chronic nephritic syndrome with unspecified morphologic changes: Secondary | ICD-10-CM | POA: Diagnosis not present

## 2012-03-12 DIAGNOSIS — N2581 Secondary hyperparathyroidism of renal origin: Secondary | ICD-10-CM | POA: Diagnosis not present

## 2012-03-12 DIAGNOSIS — N186 End stage renal disease: Secondary | ICD-10-CM | POA: Diagnosis not present

## 2012-03-12 DIAGNOSIS — D509 Iron deficiency anemia, unspecified: Secondary | ICD-10-CM | POA: Diagnosis not present

## 2012-03-14 DIAGNOSIS — N186 End stage renal disease: Secondary | ICD-10-CM | POA: Diagnosis not present

## 2012-03-14 DIAGNOSIS — D509 Iron deficiency anemia, unspecified: Secondary | ICD-10-CM | POA: Diagnosis not present

## 2012-03-14 DIAGNOSIS — N039 Chronic nephritic syndrome with unspecified morphologic changes: Secondary | ICD-10-CM | POA: Diagnosis not present

## 2012-03-14 DIAGNOSIS — N2581 Secondary hyperparathyroidism of renal origin: Secondary | ICD-10-CM | POA: Diagnosis not present

## 2012-03-16 DIAGNOSIS — N2581 Secondary hyperparathyroidism of renal origin: Secondary | ICD-10-CM | POA: Diagnosis not present

## 2012-03-16 DIAGNOSIS — D509 Iron deficiency anemia, unspecified: Secondary | ICD-10-CM | POA: Diagnosis not present

## 2012-03-16 DIAGNOSIS — D631 Anemia in chronic kidney disease: Secondary | ICD-10-CM | POA: Diagnosis not present

## 2012-03-16 DIAGNOSIS — N186 End stage renal disease: Secondary | ICD-10-CM | POA: Diagnosis not present

## 2012-03-19 DIAGNOSIS — N186 End stage renal disease: Secondary | ICD-10-CM | POA: Diagnosis not present

## 2012-03-19 DIAGNOSIS — N2581 Secondary hyperparathyroidism of renal origin: Secondary | ICD-10-CM | POA: Diagnosis not present

## 2012-03-19 DIAGNOSIS — N039 Chronic nephritic syndrome with unspecified morphologic changes: Secondary | ICD-10-CM | POA: Diagnosis not present

## 2012-03-19 DIAGNOSIS — D509 Iron deficiency anemia, unspecified: Secondary | ICD-10-CM | POA: Diagnosis not present

## 2012-03-21 DIAGNOSIS — N186 End stage renal disease: Secondary | ICD-10-CM | POA: Diagnosis not present

## 2012-03-21 DIAGNOSIS — E1129 Type 2 diabetes mellitus with other diabetic kidney complication: Secondary | ICD-10-CM | POA: Diagnosis not present

## 2012-03-21 DIAGNOSIS — N039 Chronic nephritic syndrome with unspecified morphologic changes: Secondary | ICD-10-CM | POA: Diagnosis not present

## 2012-03-21 DIAGNOSIS — N2581 Secondary hyperparathyroidism of renal origin: Secondary | ICD-10-CM | POA: Diagnosis not present

## 2012-03-21 DIAGNOSIS — E039 Hypothyroidism, unspecified: Secondary | ICD-10-CM | POA: Diagnosis not present

## 2012-03-21 DIAGNOSIS — D509 Iron deficiency anemia, unspecified: Secondary | ICD-10-CM | POA: Diagnosis not present

## 2012-03-23 DIAGNOSIS — D509 Iron deficiency anemia, unspecified: Secondary | ICD-10-CM | POA: Diagnosis not present

## 2012-03-23 DIAGNOSIS — N186 End stage renal disease: Secondary | ICD-10-CM | POA: Diagnosis not present

## 2012-03-23 DIAGNOSIS — N2581 Secondary hyperparathyroidism of renal origin: Secondary | ICD-10-CM | POA: Diagnosis not present

## 2012-03-23 DIAGNOSIS — N039 Chronic nephritic syndrome with unspecified morphologic changes: Secondary | ICD-10-CM | POA: Diagnosis not present

## 2012-03-26 DIAGNOSIS — N2581 Secondary hyperparathyroidism of renal origin: Secondary | ICD-10-CM | POA: Diagnosis not present

## 2012-03-26 DIAGNOSIS — D631 Anemia in chronic kidney disease: Secondary | ICD-10-CM | POA: Diagnosis not present

## 2012-03-26 DIAGNOSIS — N186 End stage renal disease: Secondary | ICD-10-CM | POA: Diagnosis not present

## 2012-03-26 DIAGNOSIS — D509 Iron deficiency anemia, unspecified: Secondary | ICD-10-CM | POA: Diagnosis not present

## 2012-03-28 DIAGNOSIS — N186 End stage renal disease: Secondary | ICD-10-CM | POA: Diagnosis not present

## 2012-03-28 DIAGNOSIS — D509 Iron deficiency anemia, unspecified: Secondary | ICD-10-CM | POA: Diagnosis not present

## 2012-03-28 DIAGNOSIS — N039 Chronic nephritic syndrome with unspecified morphologic changes: Secondary | ICD-10-CM | POA: Diagnosis not present

## 2012-03-28 DIAGNOSIS — N2581 Secondary hyperparathyroidism of renal origin: Secondary | ICD-10-CM | POA: Diagnosis not present

## 2012-03-29 DIAGNOSIS — N186 End stage renal disease: Secondary | ICD-10-CM | POA: Diagnosis not present

## 2012-03-30 DIAGNOSIS — D631 Anemia in chronic kidney disease: Secondary | ICD-10-CM | POA: Diagnosis not present

## 2012-03-30 DIAGNOSIS — N2581 Secondary hyperparathyroidism of renal origin: Secondary | ICD-10-CM | POA: Diagnosis not present

## 2012-03-30 DIAGNOSIS — N186 End stage renal disease: Secondary | ICD-10-CM | POA: Diagnosis not present

## 2012-03-30 DIAGNOSIS — D509 Iron deficiency anemia, unspecified: Secondary | ICD-10-CM | POA: Diagnosis not present

## 2012-03-30 DIAGNOSIS — T82598A Other mechanical complication of other cardiac and vascular devices and implants, initial encounter: Secondary | ICD-10-CM | POA: Diagnosis not present

## 2012-04-28 DIAGNOSIS — N186 End stage renal disease: Secondary | ICD-10-CM | POA: Diagnosis not present

## 2012-04-30 DIAGNOSIS — T82598A Other mechanical complication of other cardiac and vascular devices and implants, initial encounter: Secondary | ICD-10-CM | POA: Diagnosis not present

## 2012-04-30 DIAGNOSIS — N2581 Secondary hyperparathyroidism of renal origin: Secondary | ICD-10-CM | POA: Diagnosis not present

## 2012-04-30 DIAGNOSIS — N186 End stage renal disease: Secondary | ICD-10-CM | POA: Diagnosis not present

## 2012-04-30 DIAGNOSIS — D509 Iron deficiency anemia, unspecified: Secondary | ICD-10-CM | POA: Diagnosis not present

## 2012-05-02 DIAGNOSIS — N2581 Secondary hyperparathyroidism of renal origin: Secondary | ICD-10-CM | POA: Diagnosis not present

## 2012-05-02 DIAGNOSIS — N186 End stage renal disease: Secondary | ICD-10-CM | POA: Diagnosis not present

## 2012-05-02 DIAGNOSIS — T82598A Other mechanical complication of other cardiac and vascular devices and implants, initial encounter: Secondary | ICD-10-CM | POA: Diagnosis not present

## 2012-05-02 DIAGNOSIS — D509 Iron deficiency anemia, unspecified: Secondary | ICD-10-CM | POA: Diagnosis not present

## 2012-05-04 DIAGNOSIS — N2581 Secondary hyperparathyroidism of renal origin: Secondary | ICD-10-CM | POA: Diagnosis not present

## 2012-05-04 DIAGNOSIS — N186 End stage renal disease: Secondary | ICD-10-CM | POA: Diagnosis not present

## 2012-05-04 DIAGNOSIS — T82598A Other mechanical complication of other cardiac and vascular devices and implants, initial encounter: Secondary | ICD-10-CM | POA: Diagnosis not present

## 2012-05-04 DIAGNOSIS — D509 Iron deficiency anemia, unspecified: Secondary | ICD-10-CM | POA: Diagnosis not present

## 2012-05-07 DIAGNOSIS — N186 End stage renal disease: Secondary | ICD-10-CM | POA: Diagnosis not present

## 2012-05-07 DIAGNOSIS — T82598A Other mechanical complication of other cardiac and vascular devices and implants, initial encounter: Secondary | ICD-10-CM | POA: Diagnosis not present

## 2012-05-07 DIAGNOSIS — D509 Iron deficiency anemia, unspecified: Secondary | ICD-10-CM | POA: Diagnosis not present

## 2012-05-07 DIAGNOSIS — N2581 Secondary hyperparathyroidism of renal origin: Secondary | ICD-10-CM | POA: Diagnosis not present

## 2012-05-09 DIAGNOSIS — D509 Iron deficiency anemia, unspecified: Secondary | ICD-10-CM | POA: Diagnosis not present

## 2012-05-09 DIAGNOSIS — N186 End stage renal disease: Secondary | ICD-10-CM | POA: Diagnosis not present

## 2012-05-09 DIAGNOSIS — N2581 Secondary hyperparathyroidism of renal origin: Secondary | ICD-10-CM | POA: Diagnosis not present

## 2012-05-09 DIAGNOSIS — T82598A Other mechanical complication of other cardiac and vascular devices and implants, initial encounter: Secondary | ICD-10-CM | POA: Diagnosis not present

## 2012-05-11 DIAGNOSIS — D509 Iron deficiency anemia, unspecified: Secondary | ICD-10-CM | POA: Diagnosis not present

## 2012-05-11 DIAGNOSIS — B999 Unspecified infectious disease: Secondary | ICD-10-CM | POA: Diagnosis not present

## 2012-05-11 DIAGNOSIS — R300 Dysuria: Secondary | ICD-10-CM | POA: Diagnosis not present

## 2012-05-11 DIAGNOSIS — N186 End stage renal disease: Secondary | ICD-10-CM | POA: Diagnosis not present

## 2012-05-11 DIAGNOSIS — T82598A Other mechanical complication of other cardiac and vascular devices and implants, initial encounter: Secondary | ICD-10-CM | POA: Diagnosis not present

## 2012-05-11 DIAGNOSIS — N2581 Secondary hyperparathyroidism of renal origin: Secondary | ICD-10-CM | POA: Diagnosis not present

## 2012-05-14 ENCOUNTER — Other Ambulatory Visit (HOSPITAL_COMMUNITY): Payer: Self-pay | Admitting: Nephrology

## 2012-05-14 DIAGNOSIS — T82598A Other mechanical complication of other cardiac and vascular devices and implants, initial encounter: Secondary | ICD-10-CM | POA: Diagnosis not present

## 2012-05-14 DIAGNOSIS — N2581 Secondary hyperparathyroidism of renal origin: Secondary | ICD-10-CM | POA: Diagnosis not present

## 2012-05-14 DIAGNOSIS — N186 End stage renal disease: Secondary | ICD-10-CM

## 2012-05-14 DIAGNOSIS — D509 Iron deficiency anemia, unspecified: Secondary | ICD-10-CM | POA: Diagnosis not present

## 2012-05-15 ENCOUNTER — Inpatient Hospital Stay (HOSPITAL_COMMUNITY): Admission: RE | Admit: 2012-05-15 | Payer: Medicare Other | Source: Ambulatory Visit

## 2012-05-16 DIAGNOSIS — D509 Iron deficiency anemia, unspecified: Secondary | ICD-10-CM | POA: Diagnosis not present

## 2012-05-16 DIAGNOSIS — N186 End stage renal disease: Secondary | ICD-10-CM | POA: Diagnosis not present

## 2012-05-16 DIAGNOSIS — N2581 Secondary hyperparathyroidism of renal origin: Secondary | ICD-10-CM | POA: Diagnosis not present

## 2012-05-16 DIAGNOSIS — T82598A Other mechanical complication of other cardiac and vascular devices and implants, initial encounter: Secondary | ICD-10-CM | POA: Diagnosis not present

## 2012-05-17 ENCOUNTER — Other Ambulatory Visit (HOSPITAL_COMMUNITY): Payer: Self-pay | Admitting: Nephrology

## 2012-05-17 ENCOUNTER — Emergency Department (HOSPITAL_COMMUNITY): Payer: Medicare Other

## 2012-05-17 ENCOUNTER — Ambulatory Visit (HOSPITAL_COMMUNITY)
Admission: RE | Admit: 2012-05-17 | Discharge: 2012-05-17 | Disposition: A | Discharge status: Home / Self Care | Payer: Medicare Other | Source: Ambulatory Visit | Attending: Nephrology | Admitting: Nephrology

## 2012-05-17 ENCOUNTER — Emergency Department (HOSPITAL_COMMUNITY)
Admission: EM | Admit: 2012-05-17 | Discharge: 2012-05-18 | Disposition: A | Discharge status: Home / Self Care | Payer: Medicare Other | Source: Home / Self Care | Attending: Emergency Medicine | Admitting: Emergency Medicine

## 2012-05-17 ENCOUNTER — Encounter (HOSPITAL_COMMUNITY): Payer: Self-pay | Admitting: *Deleted

## 2012-05-17 VITALS — BP 160/82 | HR 94 | Resp 16

## 2012-05-17 DIAGNOSIS — E785 Hyperlipidemia, unspecified: Secondary | ICD-10-CM | POA: Insufficient documentation

## 2012-05-17 DIAGNOSIS — K625 Hemorrhage of anus and rectum: Secondary | ICD-10-CM

## 2012-05-17 DIAGNOSIS — F17200 Nicotine dependence, unspecified, uncomplicated: Secondary | ICD-10-CM | POA: Insufficient documentation

## 2012-05-17 DIAGNOSIS — N186 End stage renal disease: Secondary | ICD-10-CM

## 2012-05-17 DIAGNOSIS — E079 Disorder of thyroid, unspecified: Secondary | ICD-10-CM | POA: Insufficient documentation

## 2012-05-17 DIAGNOSIS — N185 Chronic kidney disease, stage 5: Secondary | ICD-10-CM | POA: Diagnosis not present

## 2012-05-17 DIAGNOSIS — R739 Hyperglycemia, unspecified: Secondary | ICD-10-CM

## 2012-05-17 DIAGNOSIS — I10 Essential (primary) hypertension: Secondary | ICD-10-CM

## 2012-05-17 DIAGNOSIS — R112 Nausea with vomiting, unspecified: Secondary | ICD-10-CM | POA: Insufficient documentation

## 2012-05-17 DIAGNOSIS — R109 Unspecified abdominal pain: Secondary | ICD-10-CM | POA: Insufficient documentation

## 2012-05-17 DIAGNOSIS — E119 Type 2 diabetes mellitus without complications: Secondary | ICD-10-CM | POA: Insufficient documentation

## 2012-05-17 DIAGNOSIS — K5790 Diverticulosis of intestine, part unspecified, without perforation or abscess without bleeding: Secondary | ICD-10-CM

## 2012-05-17 DIAGNOSIS — Z79899 Other long term (current) drug therapy: Secondary | ICD-10-CM | POA: Insufficient documentation

## 2012-05-17 DIAGNOSIS — J4489 Other specified chronic obstructive pulmonary disease: Secondary | ICD-10-CM | POA: Insufficient documentation

## 2012-05-17 DIAGNOSIS — J449 Chronic obstructive pulmonary disease, unspecified: Secondary | ICD-10-CM | POA: Insufficient documentation

## 2012-05-17 DIAGNOSIS — Z992 Dependence on renal dialysis: Secondary | ICD-10-CM | POA: Insufficient documentation

## 2012-05-17 DIAGNOSIS — I120 Hypertensive chronic kidney disease with stage 5 chronic kidney disease or end stage renal disease: Secondary | ICD-10-CM | POA: Insufficient documentation

## 2012-05-17 DIAGNOSIS — K5900 Constipation, unspecified: Secondary | ICD-10-CM | POA: Diagnosis not present

## 2012-05-17 DIAGNOSIS — K5730 Diverticulosis of large intestine without perforation or abscess without bleeding: Secondary | ICD-10-CM | POA: Insufficient documentation

## 2012-05-17 DIAGNOSIS — T82898A Other specified complication of vascular prosthetic devices, implants and grafts, initial encounter: Secondary | ICD-10-CM | POA: Insufficient documentation

## 2012-05-17 DIAGNOSIS — E1169 Type 2 diabetes mellitus with other specified complication: Secondary | ICD-10-CM | POA: Insufficient documentation

## 2012-05-17 DIAGNOSIS — Z794 Long term (current) use of insulin: Secondary | ICD-10-CM | POA: Insufficient documentation

## 2012-05-17 DIAGNOSIS — Y849 Medical procedure, unspecified as the cause of abnormal reaction of the patient, or of later complication, without mention of misadventure at the time of the procedure: Secondary | ICD-10-CM | POA: Insufficient documentation

## 2012-05-17 LAB — CBC WITH DIFFERENTIAL/PLATELET
Basophils Absolute: 0 10*3/uL (ref 0.0–0.1)
Basophils Relative: 0 % (ref 0–1)
Eosinophils Absolute: 0 10*3/uL (ref 0.0–0.7)
Eosinophils Relative: 0 % (ref 0–5)
Lymphs Abs: 0.4 10*3/uL — ABNORMAL LOW (ref 0.7–4.0)
MCH: 26 pg (ref 26.0–34.0)
MCHC: 31.6 g/dL (ref 30.0–36.0)
MCV: 82.5 fL (ref 78.0–100.0)
Neutrophils Relative %: 94 % — ABNORMAL HIGH (ref 43–77)
Platelets: 123 10*3/uL — ABNORMAL LOW (ref 150–400)
RDW: 17.1 % — ABNORMAL HIGH (ref 11.5–15.5)

## 2012-05-17 LAB — TYPE AND SCREEN: ABO/RH(D): A POS

## 2012-05-17 LAB — COMPREHENSIVE METABOLIC PANEL
AST: 25 U/L (ref 0–37)
Alkaline Phosphatase: 196 U/L — ABNORMAL HIGH (ref 39–117)
Creatinine, Ser: 7.76 mg/dL — ABNORMAL HIGH (ref 0.50–1.10)
GFR calc non Af Amer: 5 mL/min — ABNORMAL LOW (ref >90)
Glucose, Bld: 636 mg/dL (ref 70–99)
Potassium: 4.7 mEq/L (ref 3.5–5.1)
Sodium: 124 mEq/L — ABNORMAL LOW (ref 135–145)
Total Bilirubin: 0.2 mg/dL — ABNORMAL LOW (ref 0.3–1.2)

## 2012-05-17 LAB — LIPASE, BLOOD: Lipase: 80 U/L — ABNORMAL HIGH (ref 11–59)

## 2012-05-17 LAB — GLUCOSE, CAPILLARY: Glucose-Capillary: 390 mg/dL — ABNORMAL HIGH (ref 70–99)

## 2012-05-17 LAB — LACTIC ACID, PLASMA: Lactic Acid, Venous: 3.5 mmol/L — ABNORMAL HIGH (ref 0.5–2.2)

## 2012-05-17 MED ORDER — MIDAZOLAM HCL 2 MG/2ML IJ SOLN
INTRAMUSCULAR | Status: DC | PRN
  Administered 2012-05-17: 1 mg via INTRAVENOUS

## 2012-05-17 MED ORDER — CEFAZOLIN SODIUM 1-5 GM-% IV SOLN
INTRAVENOUS | Status: AC
  Administered 2012-05-17: 1000 mg
  Filled 2012-05-17: qty 50

## 2012-05-17 MED ORDER — SODIUM CHLORIDE 0.9 % IV BOLUS (SEPSIS)
500.0000 mL | Freq: Once | INTRAVENOUS | Status: AC
  Administered 2012-05-17: 500 mL via INTRAVENOUS

## 2012-05-17 MED ORDER — FENTANYL CITRATE 0.05 MG/ML IJ SOLN
INTRAMUSCULAR | Status: DC | PRN
  Administered 2012-05-17: 50 ug via INTRAVENOUS

## 2012-05-17 MED ORDER — MIDAZOLAM HCL 2 MG/2ML IJ SOLN
INTRAMUSCULAR | Status: AC
  Filled 2012-05-17: qty 2

## 2012-05-17 MED ORDER — INSULIN ASPART PROT & ASPART (70-30 MIX) 100 UNIT/ML ~~LOC~~ SUSP
SUBCUTANEOUS | Status: AC
  Filled 2012-05-17: qty 10

## 2012-05-17 MED ORDER — IOHEXOL 300 MG/ML  SOLN
50.0000 mL | Freq: Once | INTRAMUSCULAR | Status: AC | PRN
  Administered 2012-05-17: 40 mL via INTRAVENOUS

## 2012-05-17 MED ORDER — HEPARIN SODIUM (PORCINE) 1000 UNIT/ML IJ SOLN
INTRAMUSCULAR | Status: AC
  Filled 2012-05-17: qty 1

## 2012-05-17 MED ORDER — INSULIN ASPART PROT & ASPART (70-30 MIX) 100 UNIT/ML ~~LOC~~ SUSP
15.0000 [IU] | SUBCUTANEOUS | Status: AC
  Administered 2012-05-17: 15 [IU] via SUBCUTANEOUS
  Filled 2012-05-17: qty 10

## 2012-05-17 MED ORDER — BISACODYL 5 MG PO TBEC: 5.0000 mg | DELAYED_RELEASE_TABLET | Freq: Two times a day (BID) | ORAL | Status: DC

## 2012-05-17 MED ORDER — FENTANYL CITRATE 0.05 MG/ML IJ SOLN
INTRAMUSCULAR | Status: AC
  Filled 2012-05-17: qty 2

## 2012-05-17 MED ORDER — CHLORHEXIDINE GLUCONATE 4 % EX LIQD
CUTANEOUS | Status: AC
  Filled 2012-05-17: qty 30

## 2012-05-17 NOTE — ED Notes (Signed)
Ice chips given

## 2012-05-17 NOTE — ED Notes (Signed)
MD at bedside. 

## 2012-05-17 NOTE — ED Notes (Addendum)
Pressure and pain in rectal area, 2 episode of rectal bleeding today.  Seen [facility] today for  Problems with dialysis catheter.    Dialyisis on MWF

## 2012-05-17 NOTE — ED Notes (Signed)
Patient transported to CT 

## 2012-05-17 NOTE — ED Notes (Addendum)
CRITICAL VALUE ALERT  Critical value received:  Glucose 636  Date of notification:  05/17/12  Time of notification:  1953  Critical value read back: Yes  Nurse who received alert:  Rudene Anda, RN  MD notified (1st page):  Dr. Rulon Abide @1953 

## 2012-05-17 NOTE — Procedures (Signed)
Exchange L IJ tunneled HD CVC No complication No blood loss. See complete dictation in [facility]

## 2012-05-17 NOTE — ED Provider Notes (Signed)
History   This chart was scribed for Sheila Skene, MD by [name], ED Scribe. The patient was seen in room APA06/APA06 and the patient's care was started at 6:17PM.     CSN: [id number]  Arrival date & time 05/17/12  1807   First MD Initiated Contact with Patient 05/17/12 1817      Chief Complaint  Patient presents with  . Rectal Bleeding    (Consider location/radiation/quality/duration/timing/severity/associated sxs/prior treatment) HPI  [name] is a 66 y.o. female , with a hx of colonoscopy (Performed four years ago) and dialysis care patient (Perfmored at [facility] on M/W/F), who presents to the Emergency Department complaining of sudden, progressively worsening, rectal bleeding, onset today (05/17/12).  Associated symptoms include abdominal cramping located at the lower abdomen. The pt reports her rectal bleeding is relatively small in amount (about a teaspoon) and is accompanied by an abdominal cramping sensation. Patient had some vomiting earlier which she attributed to her dialysis, but she did start feeling better was able to eat prior to coming to the emergency department. She says she typically takes her insulin before that, but did not take her insulin so after she eats.  The pt denies any familial hx of colon problems. Additionally, the pt denies any hx of diverticulitis.    The pt is a current everyday smoker (0.5 packs/day), however, she does not drink alcohol.   PCP is Dr. [name].   Past Medical History  Diagnosis Date  . Dialysis care     M-W-F dialysis at [facility]  . COPD (chronic obstructive pulmonary disease)   . Diabetes mellitus   . Hypertension   . Thyroid disease   . Hyperlipidemia   . Bronchitis   . Leg pain   . Renal disorder     Past Surgical History  Procedure Date  . Cholecystectomy   . Dg av dialysis shunt access exist*r* or     working right HD catheter  . Av fistula placement 05/18/2010    History reviewed.  No pertinent family history.  History  Substance Use Topics  . Smoking status: Current Every Day Smoker -- 0.5 packs/day for 30 years    Types: Cigarettes  . Smokeless tobacco: Not on file  . Alcohol Use: No    OB History    Grav Para Term Preterm Abortions TAB SAB Ect Mult Living            2      Review of Systems  At least 10pt or greater review of systems completed and are negative except where specified in the HPI.   Allergies  Contrast media  Home Medications   Current Outpatient Rx  Name  Route  Sig  Dispense  Refill  . DOXAZOSIN MESYLATE 8 MG PO TABS   Oral   Take 8 mg by mouth at bedtime.           Marland Kitchen GLIPIZIDE 10 MG PO TABS   Oral   Take 10 mg by mouth 2 (two) times daily before a meal.           . INSULIN LISPRO PROT & LISPRO (75-25) 100 UNIT/ML  SUSP   Subcutaneous   Inject 15-35 Units into the skin. Patient takes 35 units every morning and 15 units in the evening          . LEVOTHYROXINE SODIUM 75 MCG PO TABS   Oral   Take 75 mcg by mouth daily.           Marland Kitchen  LISINOPRIL 20 MG PO TABS   Oral   Take 1 mg by mouth daily.         Marland Kitchen RENA-VITE PO TABS   Oral   Take 1 tablet by mouth daily.         Marland Kitchen NIACIN ER (ANTIHYPERLIPIDEMIC) 500 MG PO TBCR   Oral   Take 500 mg by mouth at bedtime.             BP 202/72  Pulse 111  Temp 98 F (36.7 C) (Oral)  Resp 20  Ht 5\' 3"  (1.6 m)  Wt 178 lb (80.74 kg)  BMI 31.53 kg/m2  SpO2 98%  Physical Exam  Nursing notes reviewed.  Electronic medical record reviewed. VITAL SIGNS:   Filed Vitals:   05/17/12 1811 05/17/12 2219 05/17/12 2323  BP: 202/72 166/72   Pulse: 111 99   Temp: 98 F (36.7 C)  97.7 F (36.5 C)  TempSrc: Oral  Oral  Resp: 20 22   Height: 5\' 3"  (1.6 m)    Weight: 178 lb (80.74 kg)    SpO2: 98% 95%    CONSTITUTIONAL: Awake, oriented, appears non-toxic HENT: Atraumatic, normocephalic, oral mucosa pink and moist, airway patent. Nares patent without drainage. External  ears normal. EYES: Conjunctiva clear, EOMI, PERRLA NECK: Trachea midline, non-tender, supple CARDIOVASCULAR: Normal heart rate, Normal rhythm, No murmurs, rubs, gallops PULMONARY/CHEST: Faint wheezing left lung fields are otherwise clear. Non-tender. Dialysis catheter left upper chest. ABDOMINAL: Non-distended, soft, non-tender - no rebound or guarding.  BS normal. NEUROLOGIC: Non-focal, moving all four extremities, no gross sensory or motor deficits. EXTREMITIES: No clubbing, cyanosis, or edema. Patient has well-healed scars on the right antecubital fossa and right forearm there is no thrill or pulsations felt a dislocation. Celso 3 scars on the left forearm without thrill or pulsations either. Patient has a well-healed scar to the radial aspect of the right wrist. SKIN: Warm, Dry, No erythema, No rash  Scars on the right cubital fossa and right forearm. No thrill. No pulsations. 3 scars on left forearm. No thrill. No pulsations. Well healed scar to the radial aspect of right wrist. Dialysis catheter in the left upper chest. Faint wheezing detected at the left lung fields.   ED Course  Procedures (including critical care time)  DIAGNOSTIC STUDIES: Oxygen Saturation is 98% on room air, normal by my interpretation.    COORDINATION OF CARE:  6:47 PM- Treatment plan discussed with patient. Pt agrees with treatment.   Results for orders placed during the hospital encounter of 05/17/12  CBC WITH DIFFERENTIAL      Component Value Range   WBC 10.7 (*) 4.0 - 10.5 K/uL   RBC 4.34  3.87 - 5.11 MIL/uL   Hemoglobin 11.3 (*) 12.0 - 15.0 g/dL   HCT 16.1 (*) 09.6 - 04.5 %   MCV 82.5  78.0 - 100.0 fL   MCH 26.0  26.0 - 34.0 pg   MCHC 31.6  30.0 - 36.0 g/dL   RDW 40.9 (*) 81.1 - 91.4 %   Platelets 123 (*) 150 - 400 K/uL   Neutrophils Relative 94 (*) 43 - 77 %   Neutro Abs 10.1 (*) 1.7 - 7.7 K/uL   Lymphocytes Relative 4 (*) 12 - 46 %   Lymphs Abs 0.4 (*) 0.7 - 4.0 K/uL   Monocytes Relative 2 (*)  3 - 12 %   Monocytes Absolute 0.2  0.1 - 1.0 K/uL   Eosinophils Relative 0  0 - 5 %  Eosinophils Absolute 0.0  0.0 - 0.7 K/uL   Basophils Relative 0  0 - 1 %   Basophils Absolute 0.0  0.0 - 0.1 K/uL  COMPREHENSIVE METABOLIC PANEL      Component Value Range   Sodium 124 (*) 135 - 145 mEq/L   Potassium 4.7  3.5 - 5.1 mEq/L   Chloride 83 (*) 96 - 112 mEq/L   CO2 22  19 - 32 mEq/L   Glucose, Bld 636 (*) 70 - 99 mg/dL   BUN 54 (*) 6 - 23 mg/dL   Creatinine, Ser 0.98 (*) 0.50 - 1.10 mg/dL   Calcium 8.3 (*) 8.4 - 10.5 mg/dL   Total Protein 7.7  6.0 - 8.3 g/dL   Albumin 3.1 (*) 3.5 - 5.2 g/dL   AST 25  0 - 37 U/L   ALT 28  0 - 35 U/L   Alkaline Phosphatase 196 (*) 39 - 117 U/L   Total Bilirubin 0.2 (*) 0.3 - 1.2 mg/dL   GFR calc non Af Amer 5 (*) >90 mL/min   GFR calc Af Amer 6 (*) >90 mL/min  LACTIC ACID, PLASMA      Component Value Range   Lactic Acid, Venous 3.5 (*) 0.5 - 2.2 mmol/L  LIPASE, BLOOD      Component Value Range   Lipase 80 (*) 11 - 59 U/L  APTT      Component Value Range   aPTT 27  24 - 37 seconds  PROTIME-INR      Component Value Range   Prothrombin Time 13.6  11.6 - 15.2 seconds   INR 1.05  0.00 - 1.49  TYPE AND SCREEN      Component Value Range   ABO/RH(D) A POS     Antibody Screen NEG     Sample Expiration 05/20/2012     Ct Abdomen Pelvis Wo Contrast  05/17/2012  *RADIOLOGY REPORT*  Clinical Data: Rectal bleeding and lower abdominal pain.  CT ABDOMEN AND PELVIS WITHOUT CONTRAST  Technique:  Multidetector CT imaging of the abdomen and pelvis was performed following the standard protocol without intravenous contrast.  Comparison: CT abdomen pelvis 08/19/2009  Findings: At the lung base, heavy coronary artery atherosclerotic calcification is noted.  The heart is not completely imaged.  The visualized lungs are clear.  The noncontrast appearance of the liver, spleen, adrenal glands, and pancreas is within normal limits.  A 4.5 x 3.7 centimeter water density  lesion in the mid pole of the left kidney statistically most likely a renal cyst, but not completely characterized on noncontrast CT.  There are multiple hyperdense rounded lesions associated with the left renal cortex, measuring in the range of 7- 8 mm, which may be hyperdense cysts.  A few calcifications associated with both kidneys are favored to be vascular calcifications.  There is no hydronephrosis.  A hyperdense lesion associated the lower pole of the right kidney measures 10 mm.  There is extensive atherosclerotic calcification of the aortoiliac vasculature.  There is infrarenal abdominal aortic ectasia measuring up to 2.2 cm AP diameter.  Negative for aneurysm.  The appendix is identified, and courses posterior to the cecum. Proximally, the appendix measures 8-9 mm in diameter, mildly prominent.  The distal tip of the appendix measures 5 mm and contains a small locule of gas within its lumen.  The proximal aspect of the appendix does not contain gas.  No periappendiceal inflammatory stranding is appreciated.  There is extensive diverticulosis of the sigmoid colon.  There  is some sigmoid wall thickening (for example image number 58).  No discrete inflammatory stranding is seen in association with the diverticula.  There is a moderate amount of stool in the colon. There is no evidence of bowel obstruction.  The uterus is retroverted and contains a few coarse calcifications, suggesting the presence of uterine fibroids.  Urinary bladder is unremarkable.  There is degenerative disc disease at L5-S1.  Vertebral bodies are normal in height and alignment.  IMPRESSION:  1.  Early appendicitis cannot be excluded, but is not definite. The proximal appendix measures prominent in caliber, up to 8 to 9 mm, but it is noted that the proximal appendix measured approximately 8 mm on the prior CT of March 2011.  No periappendiceal inflammatory stranding is seen.  Suggest clinical correlation, and follow-up imaging as  warranted. 2.  Diverticulosis of the sigmoid colon without definite CT evidence of acute diverticulitis.  In the region of the diverticula, there is suggestion of sigmoid colon wall thickening, which may be secondary to muscular hypertrophy related to diverticulosis.  Acute colitis or underlying neoplasm cannot be excluded. 3.  4.6 cm water density left renal mass.  This is likely renal cyst.  This could be confirmed with renal ultrasound is desired. 4.  Several small bilateral, left greater than right, high density renal lesions likely reflect complex renal cysts.  These cannot be completely characterized on noncontrast CT. 5.  Aortoiliac atherosclerosis with ectasia of the infrarenal abdominal aorta.   Original Report Authenticated By: Britta Mccreedy, M.D.    Ir Fluoro Guide Cv Line Right  05/17/2012  *RADIOLOGY REPORT*  Clinical data:  End-stage renal disease.  Poor function of tunneled left IJ hemodialysis catheter.  CATHETER INJECTION UNDER FLUOROSCOPY EXCHANGE OF TUNNELED HEMODIALYSIS CENTRAL VENOUS CATHETER  Technique and findings:  Initially, water-soluble contrast was injected through both lumens of the previously placed tunneled left IJ hemodialysis catheter sequentially.  This demonstrated position of the tip of the catheter in the IVC near hepatic vein inflows.  There is occlusion of the distal aspect of the blue venous return lumen.  The catheter and surrounding skin were then prepped and draped in usual sterile fashion. Maximal barrier sterile technique was utilized including caps, mask, sterile gowns, sterile gloves, sterile drape, hand hygiene and skin antiseptic.  As antibiotic prophylaxis, cefazolin 1 gram was ordered pre- procedure and administered intravenously within one hour of incision.  Intravenous Fentanyl and Versed were administered as conscious sedation during continuous cardiorespiratory monitoring by the radiology RN, with a total moderate sedation time of 12 minutes.  The previously  placed catheter was dissected free of the underlying subcutaneous tissues and removed partially over an angled stiff glide wire.  Contrast injection demonstrated no significant fibrin sheath or SVC thrombus/stenosis.  The catheter was then exchanged over two parallel stiff glide wires for a new 19 cm Equistream hemodialysis catheter, placed with the tips in the mid right atrium.  Spot chest radiograph confirms appropriate catheter position.  Both lumens aspirated and flushed easily.  Both lumens were flushed per protocol.  The catheter was secured externally with O-Prolene sutures and a sterile dressing applied. The patient tolerated the procedure well.  No immediate complication.  IMPRESSION: 1.  Low position of the tips of the previously placed hemodialysis catheter, in the intrahepatic segment of the IVC. 2.  Technically successful exchange and repositioning of tunneled left IJ hemodialysis catheter, okay for routine use.   Original Report Authenticated By: D. Andria Rhein, MD  Ir Cv Line Injection  05/17/2012  *RADIOLOGY REPORT*  Clinical data:  End-stage renal disease.  Poor function of tunneled left IJ hemodialysis catheter.  CATHETER INJECTION UNDER FLUOROSCOPY EXCHANGE OF TUNNELED HEMODIALYSIS CENTRAL VENOUS CATHETER  Technique and findings:  Initially, water-soluble contrast was injected through both lumens of the previously placed tunneled left IJ hemodialysis catheter sequentially.  This demonstrated position of the tip of the catheter in the IVC near hepatic vein inflows.  There is occlusion of the distal aspect of the blue venous return lumen.  The catheter and surrounding skin were then prepped and draped in usual sterile fashion. Maximal barrier sterile technique was utilized including caps, mask, sterile gowns, sterile gloves, sterile drape, hand hygiene and skin antiseptic.  As antibiotic prophylaxis, cefazolin 1 gram was ordered pre- procedure and administered intravenously within one hour of  incision.  Intravenous Fentanyl and Versed were administered as conscious sedation during continuous cardiorespiratory monitoring by the radiology RN, with a total moderate sedation time of 12 minutes.  The previously placed catheter was dissected free of the underlying subcutaneous tissues and removed partially over an angled stiff glide wire.  Contrast injection demonstrated no significant fibrin sheath or SVC thrombus/stenosis.  The catheter was then exchanged over two parallel stiff glide wires for a new 19 cm Equistream hemodialysis catheter, placed with the tips in the mid right atrium.  Spot chest radiograph confirms appropriate catheter position.  Both lumens aspirated and flushed easily.  Both lumens were flushed per protocol.  The catheter was secured externally with O-Prolene sutures and a sterile dressing applied. The patient tolerated the procedure well.  No immediate complication.  IMPRESSION: 1.  Low position of the tips of the previously placed hemodialysis catheter, in the intrahepatic segment of the IVC. 2.  Technically successful exchange and repositioning of tunneled left IJ hemodialysis catheter, okay for routine use.   Original Report Authenticated By: D. Andria Rhein, MD       Ir Horace Porteous Guide Cv Line Right  05/17/2012  *RADIOLOGY REPORT*  Clinical data:  End-stage renal disease.  Poor function of tunneled left IJ hemodialysis catheter.  CATHETER INJECTION UNDER FLUOROSCOPY EXCHANGE OF TUNNELED HEMODIALYSIS CENTRAL VENOUS CATHETER  Technique and findings:  Initially, water-soluble contrast was injected through both lumens of the previously placed tunneled left IJ hemodialysis catheter sequentially.  This demonstrated position of the tip of the catheter in the IVC near hepatic vein inflows.  There is occlusion of the distal aspect of the blue venous return lumen.  The catheter and surrounding skin were then prepped and draped in usual sterile fashion. Maximal barrier sterile technique was  utilized including caps, mask, sterile gowns, sterile gloves, sterile drape, hand hygiene and skin antiseptic.  As antibiotic prophylaxis, cefazolin 1 gram was ordered pre- procedure and administered intravenously within one hour of incision.  Intravenous Fentanyl and Versed were administered as conscious sedation during continuous cardiorespiratory monitoring by the radiology RN, with a total moderate sedation time of 12 minutes.  The previously placed catheter was dissected free of the underlying subcutaneous tissues and removed partially over an angled stiff glide wire.  Contrast injection demonstrated no significant fibrin sheath or SVC thrombus/stenosis.  The catheter was then exchanged over two parallel stiff glide wires for a new 19 cm Equistream hemodialysis catheter, placed with the tips in the mid right atrium.  Spot chest radiograph confirms appropriate catheter position.  Both lumens aspirated and flushed easily.  Both lumens were flushed per protocol.  The catheter was secured  externally with O-Prolene sutures and a sterile dressing applied. The patient tolerated the procedure well.  No immediate complication.  IMPRESSION: 1.  Low position of the tips of the previously placed hemodialysis catheter, in the intrahepatic segment of the IVC. 2.  Technically successful exchange and repositioning of tunneled left IJ hemodialysis catheter, okay for routine use.   Original Report Authenticated By: D. Andria Rhein, MD    Ir Cv Line Injection  05/17/2012  *RADIOLOGY REPORT*  Clinical data:  End-stage renal disease.  Poor function of tunneled left IJ hemodialysis catheter.  CATHETER INJECTION UNDER FLUOROSCOPY EXCHANGE OF TUNNELED HEMODIALYSIS CENTRAL VENOUS CATHETER  Technique and findings:  Initially, water-soluble contrast was injected through both lumens of the previously placed tunneled left IJ hemodialysis catheter sequentially.  This demonstrated position of the tip of the catheter in the IVC near  hepatic vein inflows.  There is occlusion of the distal aspect of the blue venous return lumen.  The catheter and surrounding skin were then prepped and draped in usual sterile fashion. Maximal barrier sterile technique was utilized including caps, mask, sterile gowns, sterile gloves, sterile drape, hand hygiene and skin antiseptic.  As antibiotic prophylaxis, cefazolin 1 gram was ordered pre- procedure and administered intravenously within one hour of incision.  Intravenous Fentanyl and Versed were administered as conscious sedation during continuous cardiorespiratory monitoring by the radiology RN, with a total moderate sedation time of 12 minutes.  The previously placed catheter was dissected free of the underlying subcutaneous tissues and removed partially over an angled stiff glide wire.  Contrast injection demonstrated no significant fibrin sheath or SVC thrombus/stenosis.  The catheter was then exchanged over two parallel stiff glide wires for a new 19 cm Equistream hemodialysis catheter, placed with the tips in the mid right atrium.  Spot chest radiograph confirms appropriate catheter position.  Both lumens aspirated and flushed easily.  Both lumens were flushed per protocol.  The catheter was secured externally with O-Prolene sutures and a sterile dressing applied. The patient tolerated the procedure well.  No immediate complication.  IMPRESSION: 1.  Low position of the tips of the previously placed hemodialysis catheter, in the intrahepatic segment of the IVC. 2.  Technically successful exchange and repositioning of tunneled left IJ hemodialysis catheter, okay for routine use.   Original Report Authenticated By: D. Andria Rhein, MD      1. RENAL DISEASE, CHRONIC, STAGE V   2. Abdominal pain   3. Constipation   4. Nausea and vomiting   5. Rectal bleeding   6. Diverticulosis   7. HYPERTENSION   8. Diabetes   9. Hyperglycemia       MDM  [name] is a 66 y.o. female presenting with  rectal bleeding. Patient has extensive medical history, patient is not had a colonoscopy for approximately 4 years - consider AVM is bleeding versus diverticular bleed versus malignancy. On physical exam no active bleeding and no gross blood at this time. She does have occult blood positive stools.  Laboratory data shows that the patient is likely dehydrated from some vomiting -liver a small amount of saline. Glucose was elevated, she does relate a history of not taking her insulin as she typically does today. We've given her her nighttime dose of insulin. White count was slightly elevated at 10.6 however this is nonspecific and I think likely secondary to vomiting. Lactate was also marginally elevated at 3.5 again with elevated glucose in the context of this patient-I think this is likely due to  insufficient insulin. Patient is not acidotic, she is nontoxic,  She's not tachycardic - no she did have one tachycardic reading, blood pressure is a bit elevated but she's not taking her p.m. medicines. She feels absolutely fine and would like to leave the hospital. I do not see any reason to admit this patient in the hospital at this time, she can followup and have dialysis tomorrow and take her regular home medicines. Patient knows to followup with gastroenterology to have a followup colonoscopy to further evaluate a thickened wall of her sigmoid colon. In addition patient's diverticulosis was explained to her in detail-also put her on stool softeners to help her move her bowels more regularly to avoid causing diverticular bleeding or diverticulitis. I told her to talk to her nephrologist concerning a bowel regimen compatible with her dialysis.  I explained the diagnosis and have given explicit precautions to return to the ER including any other new or worsening symptoms. The patient understands and accepts the medical plan as it's been dictated and I have answered their questions. Discharge instructions concerning  home care and prescriptions have been given.  The patient is STABLE and is discharged to home in good condition.       I personally performed the services described in this documentation, which was scribed in my presence. The recorded information has been reviewed and is accurate. [name], M.D.     Sheila Skene, MD 05/19/12 [phone]

## 2012-05-17 NOTE — H&P (Signed)
[name] is an 66 y.o. female.   Chief Complaint: poor flows HD cath HPI: Placed in September. Poor flows x 2 weeks. Fluoro shows tip in hepatic vein, needs revision.  Past Medical History  Diagnosis Date  . Dialysis care     M-W-F dialysis at [facility]  . COPD (chronic obstructive pulmonary disease)   . Diabetes mellitus   . Hypertension   . Thyroid disease   . Hyperlipidemia   . Bronchitis   . Leg pain   . Renal disorder     Past Surgical History  Procedure Date  . Cholecystectomy   . Dg av dialysis shunt access exist*r* or     working right HD catheter  . Av fistula placement 05/18/2010    No family history on file. Social History:  reports that she has been smoking Cigarettes.  She has a 15 pack-year smoking history. She does not have any smokeless tobacco history on file. She reports that she does not drink alcohol or use illicit drugs.  Allergies:  Allergies  Allergen Reactions  . Contrast Media (Iodinated Diagnostic Agents)      (Not in a hospital admission)  No results found for this or any previous visit (from the past 48 hour(s)). No results found.  ROS No MI, no CVA, no bleed diathesis. Tolerated anesth.  There were no vitals taken for this visit. Physical Exam see presedation section  Assessment/Plan Tunneled CVC exchange  Adonna Horsley III,DAYNE Adie Vilar 05/17/2012, 11:12 AM

## 2012-05-18 DIAGNOSIS — T82598A Other mechanical complication of other cardiac and vascular devices and implants, initial encounter: Secondary | ICD-10-CM | POA: Diagnosis not present

## 2012-05-18 DIAGNOSIS — N2581 Secondary hyperparathyroidism of renal origin: Secondary | ICD-10-CM | POA: Diagnosis not present

## 2012-05-18 DIAGNOSIS — N186 End stage renal disease: Secondary | ICD-10-CM | POA: Diagnosis not present

## 2012-05-18 DIAGNOSIS — D509 Iron deficiency anemia, unspecified: Secondary | ICD-10-CM | POA: Diagnosis not present

## 2012-05-18 LAB — OCCULT BLOOD, POC DEVICE: Fecal Occult Bld: POSITIVE — AB

## 2012-05-19 ENCOUNTER — Emergency Department (HOSPITAL_COMMUNITY): Payer: Medicare Other

## 2012-05-19 ENCOUNTER — Inpatient Hospital Stay (HOSPITAL_COMMUNITY)
Admission: EM | Admit: 2012-05-19 | Discharge: 2012-06-04 | DRG: 480 | Disposition: A | Discharge status: Skilled Nursing Facility | Payer: Medicare Other | Attending: Internal Medicine | Admitting: Internal Medicine

## 2012-05-19 ENCOUNTER — Encounter (HOSPITAL_COMMUNITY): Payer: Self-pay

## 2012-05-19 DIAGNOSIS — N2581 Secondary hyperparathyroidism of renal origin: Secondary | ICD-10-CM | POA: Diagnosis not present

## 2012-05-19 DIAGNOSIS — J4489 Other specified chronic obstructive pulmonary disease: Secondary | ICD-10-CM | POA: Diagnosis present

## 2012-05-19 DIAGNOSIS — R109 Unspecified abdominal pain: Secondary | ICD-10-CM

## 2012-05-19 DIAGNOSIS — N039 Chronic nephritic syndrome with unspecified morphologic changes: Secondary | ICD-10-CM | POA: Diagnosis present

## 2012-05-19 DIAGNOSIS — N6019 Diffuse cystic mastopathy of unspecified breast: Secondary | ICD-10-CM

## 2012-05-19 DIAGNOSIS — M6281 Muscle weakness (generalized): Secondary | ICD-10-CM | POA: Diagnosis not present

## 2012-05-19 DIAGNOSIS — S7290XA Unspecified fracture of unspecified femur, initial encounter for closed fracture: Principal | ICD-10-CM

## 2012-05-19 DIAGNOSIS — D649 Anemia, unspecified: Secondary | ICD-10-CM | POA: Diagnosis not present

## 2012-05-19 DIAGNOSIS — Z992 Dependence on renal dialysis: Secondary | ICD-10-CM | POA: Diagnosis not present

## 2012-05-19 DIAGNOSIS — E119 Type 2 diabetes mellitus without complications: Secondary | ICD-10-CM | POA: Diagnosis present

## 2012-05-19 DIAGNOSIS — J9600 Acute respiratory failure, unspecified whether with hypoxia or hypercapnia: Secondary | ICD-10-CM

## 2012-05-19 DIAGNOSIS — J069 Acute upper respiratory infection, unspecified: Secondary | ICD-10-CM

## 2012-05-19 DIAGNOSIS — S7292XA Unspecified fracture of left femur, initial encounter for closed fracture: Secondary | ICD-10-CM

## 2012-05-19 DIAGNOSIS — D631 Anemia in chronic kidney disease: Secondary | ICD-10-CM

## 2012-05-19 DIAGNOSIS — I120 Hypertensive chronic kidney disease with stage 5 chronic kidney disease or end stage renal disease: Secondary | ICD-10-CM | POA: Diagnosis present

## 2012-05-19 DIAGNOSIS — D62 Acute posthemorrhagic anemia: Secondary | ICD-10-CM | POA: Diagnosis not present

## 2012-05-19 DIAGNOSIS — I10 Essential (primary) hypertension: Secondary | ICD-10-CM

## 2012-05-19 DIAGNOSIS — Z452 Encounter for adjustment and management of vascular access device: Secondary | ICD-10-CM | POA: Diagnosis not present

## 2012-05-19 DIAGNOSIS — G9341 Metabolic encephalopathy: Secondary | ICD-10-CM | POA: Diagnosis not present

## 2012-05-19 DIAGNOSIS — J310 Chronic rhinitis: Secondary | ICD-10-CM

## 2012-05-19 DIAGNOSIS — K625 Hemorrhage of anus and rectum: Secondary | ICD-10-CM | POA: Diagnosis not present

## 2012-05-19 DIAGNOSIS — R509 Fever, unspecified: Secondary | ICD-10-CM | POA: Diagnosis not present

## 2012-05-19 DIAGNOSIS — M545 Low back pain, unspecified: Secondary | ICD-10-CM

## 2012-05-19 DIAGNOSIS — M25519 Pain in unspecified shoulder: Secondary | ICD-10-CM

## 2012-05-19 DIAGNOSIS — L03119 Cellulitis of unspecified part of limb: Secondary | ICD-10-CM

## 2012-05-19 DIAGNOSIS — J449 Chronic obstructive pulmonary disease, unspecified: Secondary | ICD-10-CM | POA: Diagnosis present

## 2012-05-19 DIAGNOSIS — A0472 Enterocolitis due to Clostridium difficile, not specified as recurrent: Secondary | ICD-10-CM

## 2012-05-19 DIAGNOSIS — T148XXA Other injury of unspecified body region, initial encounter: Secondary | ICD-10-CM | POA: Diagnosis not present

## 2012-05-19 DIAGNOSIS — R9431 Abnormal electrocardiogram [ECG] [EKG]: Secondary | ICD-10-CM

## 2012-05-19 DIAGNOSIS — W19XXXA Unspecified fall, initial encounter: Secondary | ICD-10-CM

## 2012-05-19 DIAGNOSIS — R262 Difficulty in walking, not elsewhere classified: Secondary | ICD-10-CM | POA: Diagnosis not present

## 2012-05-19 DIAGNOSIS — S72453A Displaced supracondylar fracture without intracondylar extension of lower end of unspecified femur, initial encounter for closed fracture: Secondary | ICD-10-CM | POA: Diagnosis not present

## 2012-05-19 DIAGNOSIS — F17200 Nicotine dependence, unspecified, uncomplicated: Secondary | ICD-10-CM | POA: Diagnosis not present

## 2012-05-19 DIAGNOSIS — R6520 Severe sepsis without septic shock: Secondary | ICD-10-CM

## 2012-05-19 DIAGNOSIS — I739 Peripheral vascular disease, unspecified: Secondary | ICD-10-CM

## 2012-05-19 DIAGNOSIS — R609 Edema, unspecified: Secondary | ICD-10-CM | POA: Diagnosis not present

## 2012-05-19 DIAGNOSIS — E1165 Type 2 diabetes mellitus with hyperglycemia: Secondary | ICD-10-CM

## 2012-05-19 DIAGNOSIS — S8990XA Unspecified injury of unspecified lower leg, initial encounter: Secondary | ICD-10-CM | POA: Diagnosis not present

## 2012-05-19 DIAGNOSIS — R809 Proteinuria, unspecified: Secondary | ICD-10-CM

## 2012-05-19 DIAGNOSIS — I498 Other specified cardiac arrhythmias: Secondary | ICD-10-CM | POA: Diagnosis not present

## 2012-05-19 DIAGNOSIS — Z8719 Personal history of other diseases of the digestive system: Secondary | ICD-10-CM

## 2012-05-19 DIAGNOSIS — E042 Nontoxic multinodular goiter: Secondary | ICD-10-CM

## 2012-05-19 DIAGNOSIS — N189 Chronic kidney disease, unspecified: Secondary | ICD-10-CM | POA: Diagnosis not present

## 2012-05-19 DIAGNOSIS — R059 Cough, unspecified: Secondary | ICD-10-CM

## 2012-05-19 DIAGNOSIS — R05 Cough: Secondary | ICD-10-CM

## 2012-05-19 DIAGNOSIS — L851 Acquired keratosis [keratoderma] palmaris et plantaris: Secondary | ICD-10-CM

## 2012-05-19 DIAGNOSIS — Z794 Long term (current) use of insulin: Secondary | ICD-10-CM | POA: Diagnosis not present

## 2012-05-19 DIAGNOSIS — D638 Anemia in other chronic diseases classified elsewhere: Secondary | ICD-10-CM | POA: Diagnosis present

## 2012-05-19 DIAGNOSIS — N186 End stage renal disease: Secondary | ICD-10-CM

## 2012-05-19 DIAGNOSIS — J690 Pneumonitis due to inhalation of food and vomit: Secondary | ICD-10-CM

## 2012-05-19 DIAGNOSIS — S72409A Unspecified fracture of lower end of unspecified femur, initial encounter for closed fracture: Secondary | ICD-10-CM | POA: Diagnosis not present

## 2012-05-19 DIAGNOSIS — K5289 Other specified noninfective gastroenteritis and colitis: Secondary | ICD-10-CM | POA: Diagnosis not present

## 2012-05-19 DIAGNOSIS — A419 Sepsis, unspecified organism: Secondary | ICD-10-CM | POA: Diagnosis not present

## 2012-05-19 DIAGNOSIS — R6521 Severe sepsis with septic shock: Secondary | ICD-10-CM

## 2012-05-19 DIAGNOSIS — Y92009 Unspecified place in unspecified non-institutional (private) residence as the place of occurrence of the external cause: Secondary | ICD-10-CM

## 2012-05-19 DIAGNOSIS — R5381 Other malaise: Secondary | ICD-10-CM | POA: Diagnosis not present

## 2012-05-19 DIAGNOSIS — M25569 Pain in unspecified knee: Secondary | ICD-10-CM | POA: Diagnosis not present

## 2012-05-19 DIAGNOSIS — R55 Syncope and collapse: Secondary | ICD-10-CM

## 2012-05-19 DIAGNOSIS — M129 Arthropathy, unspecified: Secondary | ICD-10-CM

## 2012-05-19 DIAGNOSIS — Z79899 Other long term (current) drug therapy: Secondary | ICD-10-CM

## 2012-05-19 DIAGNOSIS — Z4682 Encounter for fitting and adjustment of non-vascular catheter: Secondary | ICD-10-CM | POA: Diagnosis not present

## 2012-05-19 DIAGNOSIS — S72309A Unspecified fracture of shaft of unspecified femur, initial encounter for closed fracture: Secondary | ICD-10-CM | POA: Diagnosis not present

## 2012-05-19 DIAGNOSIS — K5900 Constipation, unspecified: Secondary | ICD-10-CM

## 2012-05-19 DIAGNOSIS — S298XXA Other specified injuries of thorax, initial encounter: Secondary | ICD-10-CM | POA: Diagnosis not present

## 2012-05-19 DIAGNOSIS — R252 Cramp and spasm: Secondary | ICD-10-CM

## 2012-05-19 DIAGNOSIS — N185 Chronic kidney disease, stage 5: Secondary | ICD-10-CM | POA: Diagnosis present

## 2012-05-19 DIAGNOSIS — IMO0002 Reserved for concepts with insufficient information to code with codable children: Secondary | ICD-10-CM

## 2012-05-19 DIAGNOSIS — E785 Hyperlipidemia, unspecified: Secondary | ICD-10-CM

## 2012-05-19 DIAGNOSIS — G2581 Restless legs syndrome: Secondary | ICD-10-CM

## 2012-05-19 DIAGNOSIS — M67919 Unspecified disorder of synovium and tendon, unspecified shoulder: Secondary | ICD-10-CM

## 2012-05-19 DIAGNOSIS — R488 Other symbolic dysfunctions: Secondary | ICD-10-CM | POA: Diagnosis not present

## 2012-05-19 DIAGNOSIS — R0902 Hypoxemia: Secondary | ICD-10-CM | POA: Diagnosis not present

## 2012-05-19 DIAGNOSIS — J811 Chronic pulmonary edema: Secondary | ICD-10-CM | POA: Diagnosis not present

## 2012-05-19 DIAGNOSIS — J209 Acute bronchitis, unspecified: Secondary | ICD-10-CM

## 2012-05-19 DIAGNOSIS — W010XXA Fall on same level from slipping, tripping and stumbling without subsequent striking against object, initial encounter: Secondary | ICD-10-CM | POA: Diagnosis present

## 2012-05-19 DIAGNOSIS — D509 Iron deficiency anemia, unspecified: Secondary | ICD-10-CM

## 2012-05-19 HISTORY — DX: Unspecified osteoarthritis, unspecified site: M19.90

## 2012-05-19 HISTORY — DX: End stage renal disease: Z99.2

## 2012-05-19 HISTORY — DX: Reserved for inherently not codable concepts without codable children: IMO0001

## 2012-05-19 HISTORY — DX: Hypothyroidism, unspecified: E03.9

## 2012-05-19 HISTORY — DX: End stage renal disease: N18.6

## 2012-05-19 HISTORY — DX: Procedure and treatment not carried out because of patient's decision for reasons of belief and group pressure: Z53.1

## 2012-05-19 HISTORY — DX: Peripheral vascular disease, unspecified: I73.9

## 2012-05-19 LAB — COMPREHENSIVE METABOLIC PANEL
Alkaline Phosphatase: 165 U/L — ABNORMAL HIGH (ref 39–117)
BUN: 35 mg/dL — ABNORMAL HIGH (ref 6–23)
Calcium: 8 mg/dL — ABNORMAL LOW (ref 8.4–10.5)
GFR calc Af Amer: 6 mL/min — ABNORMAL LOW (ref >90)
Glucose, Bld: 48 mg/dL — ABNORMAL LOW (ref 70–99)
Potassium: 3.4 mEq/L — ABNORMAL LOW (ref 3.5–5.1)
Total Protein: 7 g/dL (ref 6.0–8.3)

## 2012-05-19 LAB — CBC WITH DIFFERENTIAL/PLATELET
Basophils Absolute: 0 10*3/uL (ref 0.0–0.1)
Eosinophils Absolute: 0 10*3/uL (ref 0.0–0.7)
HCT: 36.2 % (ref 36.0–46.0)
Lymphs Abs: 1.3 10*3/uL (ref 0.7–4.0)
MCH: 25.9 pg — ABNORMAL LOW (ref 26.0–34.0)
MCHC: 31.2 g/dL (ref 30.0–36.0)
MCV: 83 fL (ref 78.0–100.0)
Monocytes Absolute: 1.2 10*3/uL — ABNORMAL HIGH (ref 0.1–1.0)
Neutro Abs: 16.7 10*3/uL — ABNORMAL HIGH (ref 1.7–7.7)
RDW: 17.4 % — ABNORMAL HIGH (ref 11.5–15.5)

## 2012-05-19 LAB — GLUCOSE, CAPILLARY: Glucose-Capillary: 99 mg/dL (ref 70–99)

## 2012-05-19 LAB — PROTIME-INR: Prothrombin Time: 13.7 seconds (ref 11.6–15.2)

## 2012-05-19 MED ORDER — SODIUM CHLORIDE 0.9 % IJ SOLN
3.0000 mL | Freq: Two times a day (BID) | INTRAMUSCULAR | Status: DC
  Administered 2012-05-20: 3 mL via INTRAVENOUS

## 2012-05-19 MED ORDER — DEXTROSE-NACL 5-0.45 % IV SOLN
INTRAVENOUS | Status: DC
  Administered 2012-05-19: 20:00:00 via INTRAVENOUS

## 2012-05-19 MED ORDER — DOXAZOSIN MESYLATE 8 MG PO TABS
8.0000 mg | ORAL_TABLET | Freq: Every day | ORAL | Status: DC
  Administered 2012-05-20 (×2): 8 mg via ORAL
  Filled 2012-05-19 (×3): qty 1

## 2012-05-19 MED ORDER — ONDANSETRON HCL 4 MG/2ML IJ SOLN
4.0000 mg | Freq: Once | INTRAMUSCULAR | Status: AC
  Administered 2012-05-19: 4 mg via INTRAVENOUS
  Filled 2012-05-19: qty 2

## 2012-05-19 MED ORDER — NIACIN ER (ANTIHYPERLIPIDEMIC) 500 MG PO TBCR
500.0000 mg | EXTENDED_RELEASE_TABLET | Freq: Every day | ORAL | Status: DC
  Administered 2012-05-22 – 2012-06-04 (×13): 500 mg via ORAL
  Filled 2012-05-19 (×16): qty 1

## 2012-05-19 MED ORDER — HYDROMORPHONE HCL PF 1 MG/ML IJ SOLN
1.0000 mg | Freq: Once | INTRAMUSCULAR | Status: AC
  Administered 2012-05-19: 1 mg via INTRAVENOUS
  Filled 2012-05-19: qty 1

## 2012-05-19 MED ORDER — SODIUM CHLORIDE 0.9 % IJ SOLN: 3.0000 mL | INTRAMUSCULAR | Status: DC | PRN

## 2012-05-19 MED ORDER — HYDROMORPHONE HCL PF 1 MG/ML IJ SOLN
1.0000 mg | Freq: Once | INTRAMUSCULAR | Status: AC
  Administered 2012-05-19: 1 mg via INTRAVENOUS

## 2012-05-19 MED ORDER — HYDROMORPHONE HCL PF 1 MG/ML IJ SOLN
1.0000 mg | INTRAMUSCULAR | Status: DC | PRN
  Filled 2012-05-19: qty 1

## 2012-05-19 MED ORDER — DEXTROSE 50 % IV SOLN
INTRAVENOUS | Status: AC
  Filled 2012-05-19: qty 50

## 2012-05-19 MED ORDER — ONDANSETRON HCL 4 MG PO TABS: 4.0000 mg | ORAL_TABLET | Freq: Four times a day (QID) | ORAL | Status: DC | PRN

## 2012-05-19 MED ORDER — LEVOTHYROXINE SODIUM 75 MCG PO TABS
75.0000 ug | ORAL_TABLET | Freq: Every day | ORAL | Status: DC
  Administered 2012-05-21 – 2012-06-04 (×14): 75 ug via ORAL
  Filled 2012-05-19 (×18): qty 1

## 2012-05-19 MED ORDER — DEXTROSE 50 % IV SOLN
50.0000 mL | Freq: Once | INTRAVENOUS | Status: AC
  Administered 2012-05-19: 50 mL via INTRAVENOUS

## 2012-05-19 MED ORDER — HYDROMORPHONE HCL PF 1 MG/ML IJ SOLN: 2.0000 mg | INTRAMUSCULAR | Status: DC | PRN

## 2012-05-19 MED ORDER — SODIUM CHLORIDE 0.9 % IJ SOLN: 3.0000 mL | Freq: Two times a day (BID) | INTRAMUSCULAR | Status: DC

## 2012-05-19 MED ORDER — SODIUM CHLORIDE 0.9 % IV SOLN: 250.0000 mL | INTRAVENOUS | Status: DC | PRN

## 2012-05-19 MED ORDER — ONDANSETRON HCL 4 MG/2ML IJ SOLN
4.0000 mg | Freq: Once | INTRAMUSCULAR | Status: AC
  Administered 2012-05-19: 4 mg via INTRAVENOUS

## 2012-05-19 MED ORDER — MORPHINE SULFATE 4 MG/ML IJ SOLN
4.0000 mg | Freq: Once | INTRAMUSCULAR | Status: AC
  Administered 2012-05-19: 4 mg via INTRAVENOUS
  Filled 2012-05-19: qty 1

## 2012-05-19 MED ORDER — ONDANSETRON HCL 4 MG/2ML IJ SOLN
4.0000 mg | Freq: Three times a day (TID) | INTRAMUSCULAR | Status: DC | PRN
  Filled 2012-05-19: qty 2

## 2012-05-19 MED ORDER — ONDANSETRON HCL 4 MG/2ML IJ SOLN
4.0000 mg | Freq: Four times a day (QID) | INTRAMUSCULAR | Status: DC | PRN
  Administered 2012-05-20: 4 mg via INTRAVENOUS
  Filled 2012-05-19: qty 2

## 2012-05-19 NOTE — Consult Note (Signed)
Dx: left supracondylar femur fracture, thrombocytopenia  Films and history reviewed. D/w Dr. Manus Gunning Full consult to be performed later this evening on patient's arrival or first consult in am depending on other factors.  Plan: Retrograde IM nailing in am if no further measures required to optimize medical condition.  Myrene Galas, MD [facility] [phone] (p)

## 2012-05-19 NOTE — H&P (Signed)
PCP:   FANTA,TESFAYE, MD   Chief Complaint:  fall  HPI: 66 yo female esrd hd m/w/f and sometimes sat was getting up out of bed tonight when she tripped and fell and hurt her left leg.  Presented to [facility] ED and found to have femur fracture so transferred here for further care.  Ortho surg was called here at [facility] (no ortho coverage at [facility] on weekend)who requested transfer.  Pt is in tele bed and her pain is not well controlled, her left leg is hurting really bad.  No prior illnesses.  Last dialysis Friday.  Review of Systems:  O/w neg  Past Medical History: Past Medical History  Diagnosis Date  . Dialysis care     M-W-F dialysis at [facility]  . COPD (chronic obstructive pulmonary disease)   . Diabetes mellitus   . Hypertension   . Thyroid disease   . Hyperlipidemia   . Bronchitis   . Leg pain   . Renal disorder    Past Surgical History  Procedure Date  . Cholecystectomy   . Dg av dialysis shunt access exist*r* or     working right HD catheter  . Av fistula placement 05/18/2010    Medications: Prior to Admission medications   Medication Sig Start Date End Date Taking? Authorizing Provider  bisacodyl (DULCOLAX) 5 MG EC tablet Take 1 tablet (5 mg total) by mouth 2 (two) times daily. 05/17/12  Yes John-Adam Bonk, MD  doxazosin (CARDURA) 8 MG tablet Take 8 mg by mouth at bedtime.     Yes Historical Provider, MD  fluticasone (FLONASE) 50 MCG/ACT nasal spray Place 2 sprays into the nose Daily. 05/02/12  Yes Historical Provider, MD  glipiZIDE (GLUCOTROL) 10 MG tablet Take 10 mg by mouth 2 (two) times daily before a meal.     Yes Historical Provider, MD  insulin lispro protamine-insulin lispro (HUMALOG 75/25) (75-25) 100 UNIT/ML SUSP Inject 15-35 Units into the skin. Patient takes 35 units every morning and 15 units in the evening    Yes Historical Provider, MD  levothyroxine (SYNTHROID, LEVOTHROID) 75 MCG tablet Take 75 mcg by mouth daily.     Yes Historical Provider, MD   lisinopril (PRINIVIL,ZESTRIL) 20 MG tablet Take 20 mg by mouth every evening.    Yes Historical Provider, MD  multivitamin (RENA-VIT) TABS tablet Take 1 tablet by mouth daily.   Yes Historical Provider, MD  niacin (NIASPAN) 500 MG CR tablet Take 500 mg by mouth daily.    Yes Historical Provider, MD    Allergies:   Allergies  Allergen Reactions  . Contrast Media (Iodinated Diagnostic Agents) Itching    Social History:  reports that she has been smoking Cigarettes.  She has a 15 pack-year smoking history. She does not have any smokeless tobacco history on file. She reports that she does not drink alcohol or use illicit drugs.  Family History: Neg  Physical Exam: Filed Vitals:   05/19/12 1935 05/19/12 2000 05/19/12 2053 05/19/12 2216  BP: 111/63 103/48  101/72  Pulse: 108 102  100  Temp:    99.2 F (37.3 C)  TempSrc:    Oral  Resp: 20     Height:    5\' 3"  (1.6 m)  Weight:    79 kg (174 lb 2.6 oz)  SpO2: 100% 100% 100% 94%   General appearance: alert, cooperative and mild distress Neck: no JVD and supple, symmetrical, trachea midline Lungs: clear to auscultation bilaterally Heart: regular rate and rhythm, S1,  S2 normal, no murmur, click, rub or gallop Abdomen: soft, non-tender; bowel sounds normal; no masses,  no organomegaly Extremities: extremities normal, atraumatic, no cyanosis or edema  Left leg splinted/immobolized, distal pulses intact Pulses: 2+ and symmetric Skin: Skin color, texture, turgor normal. No rashes or lesions Neurologic: Grossly normal    Labs on Admission:   [facility] 05/19/12 1746 05/17/12 1910  NA 135 124*  K 3.4* 4.7  CL 96 83*  CO2 26 22  GLUCOSE 48* 636*  BUN 35* 54*  CREATININE 6.97* 7.76*  CALCIUM 8.0* 8.3*  MG -- --  PHOS -- --    Basename 05/19/12 1746 05/17/12 1910  AST 19 25  ALT 13 28  ALKPHOS 165* 196*  BILITOT 0.3 0.2*  PROT 7.0 7.7  ALBUMIN 2.8* 3.1*    Basename 05/17/12 1910  LIPASE 80*  AMYLASE --    Basename  05/19/12 1746 05/17/12 1910  WBC 19.2* 10.7*  NEUTROABS 16.7* 10.1*  HGB 11.3* 11.3*  HCT 36.2 35.8*  MCV 83.0 82.5  PLT 127* 123*    Radiological Exams on Admission:  Dg Chest 1 View  05/19/2012  *RADIOLOGY REPORT*  Clinical Data: Larey Seat from bed  CHEST - 1 VIEW  Comparison: 02/27/2012  Findings: Left jugular dual-lumen central venous catheter tip projecting over cavoatrial junction. Enlargement of cardiac silhouette with pulmonary vascular congestion. Mild diffuse increased interstitial markings in both lungs question minimal edema. No segmental consolidation, pleural effusion or pneumothorax. Bones demineralized.  IMPRESSION: Enlargement of cardiac silhouette with pulmonary vascular congestion. Question minimal pulmonary edema.   Original Report Authenticated By: Ulyses Southward, M.D.    Dg Femur Left  05/19/2012  *RADIOLOGY REPORT*  Clinical Data: Fall  LEFT FEMUR - 2 VIEW  Comparison: None.  Findings: Three views of the left femur submitted.  There is displaced comminuted fracture in distal left femoral shaft.  IMPRESSION: Displaced comminuted fracture distal left femoral shaft.   Original Report Authenticated By: Natasha Mead, M.D.    Dg Tibia/fibula Left  05/19/2012  *RADIOLOGY REPORT*  Clinical Data: Fall  LEFT TIBIA AND FIBULA - 2 VIEW  Comparison: None.  Findings: Two views of the left tibia-fibula submitted.  No acute fracture or subluxation.  Atherosclerotic vascular calcifications are noted.  IMPRESSION: No acute fracture or subluxation.   Original Report Authenticated By: Natasha Mead, M.D.    Dg Knee Complete 4 Views Left  05/19/2012  *RADIOLOGY REPORT*  Clinical Data: Fall  LEFT KNEE - COMPLETE 4+ VIEW  Comparison: Left femur same day  Findings: Two views of the left knee submitted.  No knee fracture or subluxation.  Again noted displaced comminuted fracture distal left femoral shaft.  IMPRESSION: No knee fracture or subluxation.  Again noted displaced comminuted fracture distal left  femoral shaft.   Original Report Authenticated By: Natasha Mead, M.D.     Assessment/Plan 66 yo female esrd dialysis dependent with mechanical fall and left distal femur fracture  Principal Problem:  *Femur fracture, left Active Problems:  ANEMIA OF RENAL FAILURE  HYPERTENSION  RENAL DISEASE, CHRONIC, STAGE V  Fall at home  Notify ortho of arrival.  Inc her dilaudid for better pain control.  neurovasc cks overnight of her leg.  Hold anticoag in case goes to OR in am.  Called nephro dialysis unit to notify of pt here.  Will need to be evaluated and followed by nephro for her dialysis needs perioperatively.  No ivf overnight due to esrd status.    Bennet Kujawa A 05/19/2012, 11:03 PM

## 2012-05-19 NOTE — Consult Note (Signed)
Orthopaedic Trauma Service   Reason for Consult: Left distal femur fracture Referring Physician: Glynn Octave, MD    HPI: Patient is a 66 year old African American female with end-stage renal disease COPD, diabetes, hypertension, hyperlipidemia who was at home earlier this morning attempting to get out of bed when she has some sort of a fall resulting in a left distal femur fracture. The patient believes that she did in fact pass out well which did result in a fall. She did come to a short time later. She was unable to get up and ambulate. She was brought to [facility] where workup didn't show a supracondylar left distal femur fracture. There was no orthopedic coverage up at that hospital and orthopedics was consulted here at [facility] for definitive treatment. given her numerous medical comorbidities she was admitted to the medical and orthopedic consultation.  Of note patient does dialysis Monday, Wednesday, Friday she did not go this Friday do to holiday schedule per her report  Currently patient is in 3 west 27, is uncomfortable appearing. Frail and ill-appearing 66 year old female. Complains only of a left knee and thigh pain,  this pain is exacerbated with movement and relieved with rest. Denies any new onset numbness or tingling in her lower extremities. Denies any chest pain no shortness of breath, no nausea vomiting or diarrhea. No abdominal pain. No headaches. No palpitations at this current time.  Past Medical History  Diagnosis Date  . Dialysis care     M-W-F dialysis at [facility]  . COPD (chronic obstructive pulmonary disease)   . Diabetes mellitus   . Hypertension   . Thyroid disease   . Hyperlipidemia   . Bronchitis   . Leg pain   . Renal disorder     Past Surgical History  Procedure Date  . Cholecystectomy   . Dg av dialysis shunt access exist*r* or     working right HD catheter  . Av fistula placement 05/18/2010    No family history  on file.  Social History:  reports that she has been smoking Cigarettes.  She has a 15 pack-year smoking history. She does not have any smokeless tobacco history on file. She reports that she does not drink alcohol or use illicit drugs. The patient lives in [place]. Lives in an apartment. Does not use any assistive devices to help with ambulation.  Allergies:  Allergies  Allergen Reactions  . Contrast Media (Iodinated Diagnostic Agents) Itching    Medications:  I have reviewed the patient's current medications. Prior to Admission:  Prescriptions prior to admission  Medication Sig Dispense Refill  . bisacodyl (DULCOLAX) 5 MG EC tablet Take 1 tablet (5 mg total) by mouth 2 (two) times daily.  14 tablet  0  . doxazosin (CARDURA) 8 MG tablet Take 8 mg by mouth at bedtime.        . fluticasone (FLONASE) 50 MCG/ACT nasal spray Place 2 sprays into the nose Daily.      Marland Kitchen glipiZIDE (GLUCOTROL) 10 MG tablet Take 10 mg by mouth 2 (two) times daily before a meal.        . insulin lispro protamine-insulin lispro (HUMALOG 75/25) (75-25) 100 UNIT/ML SUSP Inject 15-35 Units into the skin. Patient takes 35 units every morning and 15 units in the evening       . levothyroxine (SYNTHROID, LEVOTHROID) 75 MCG tablet Take 75 mcg by mouth daily.        Marland Kitchen lisinopril (PRINIVIL,ZESTRIL) 20 MG tablet Take 20  mg by mouth every evening.       . multivitamin (RENA-VIT) TABS tablet Take 1 tablet by mouth daily.      . niacin (NIASPAN) 500 MG CR tablet Take 500 mg by mouth daily.        Scheduled:   . dextrose      . doxazosin  8 mg Oral QHS  . levothyroxine  75 mcg Oral QAC breakfast  . niacin  500 mg Oral Daily  . sodium chloride  3 mL Intravenous Q12H  . sodium chloride  3 mL Intravenous Q12H   Continuous:  ION:GEXBMW chloride, HYDROmorphone (DILAUDID) injection, ondansetron (ZOFRAN) IV, ondansetron, sodium chloride  Results for orders placed during the hospital encounter of 05/19/12 (from the past 48  hour(s))  CBC WITH DIFFERENTIAL     Status: Abnormal   Collection Time   05/19/12  5:46 PM      Component Value Range Comment   WBC 19.2 (*) 4.0 - 10.5 K/uL    RBC 4.36  3.87 - 5.11 MIL/uL    Hemoglobin 11.3 (*) 12.0 - 15.0 g/dL    HCT 41.3  24.4 - 01.0 %    MCV 83.0  78.0 - 100.0 fL    MCH 25.9 (*) 26.0 - 34.0 pg    MCHC 31.2  30.0 - 36.0 g/dL    RDW 27.2 (*) 53.6 - 15.5 %    Platelets 127 (*) 150 - 400 K/uL    Neutrophils Relative 87 (*) 43 - 77 %    Lymphocytes Relative 7 (*) 12 - 46 %    Monocytes Relative 6  3 - 12 %    Eosinophils Relative 0  0 - 5 %    Basophils Relative 0  0 - 1 %    Neutro Abs 16.7 (*) 1.7 - 7.7 K/uL    Lymphs Abs 1.3  0.7 - 4.0 K/uL    Monocytes Absolute 1.2 (*) 0.1 - 1.0 K/uL    Eosinophils Absolute 0.0  0.0 - 0.7 K/uL    Basophils Absolute 0.0  0.0 - 0.1 K/uL    RBC Morphology TARGET CELLS   SPHEROCYTES   WBC Morphology TOXIC GRANULATION     COMPREHENSIVE METABOLIC PANEL     Status: Abnormal   Collection Time   05/19/12  5:46 PM      Component Value Range Comment   Sodium 135  135 - 145 mEq/L DELTA CHECK NOTED   Potassium 3.4 (*) 3.5 - 5.1 mEq/L DELTA CHECK NOTED   Chloride 96  96 - 112 mEq/L DELTA CHECK NOTED   CO2 26  19 - 32 mEq/L    Glucose, Bld 48 (*) 70 - 99 mg/dL    BUN 35 (*) 6 - 23 mg/dL DELTA CHECK NOTED   Creatinine, Ser 6.97 (*) 0.50 - 1.10 mg/dL    Calcium 8.0 (*) 8.4 - 10.5 mg/dL    Total Protein 7.0  6.0 - 8.3 g/dL    Albumin 2.8 (*) 3.5 - 5.2 g/dL    AST 19  0 - 37 U/L    ALT 13  0 - 35 U/L    Alkaline Phosphatase 165 (*) 39 - 117 U/L    Total Bilirubin 0.3  0.3 - 1.2 mg/dL    GFR calc non Af Amer 5 (*) >90 mL/min    GFR calc Af Amer 6 (*) >90 mL/min   PROTIME-INR     Status: Normal   Collection Time   05/19/12  5:46  PM      Component Value Range Comment   Prothrombin Time 13.7  11.6 - 15.2 seconds    INR 1.06  0.00 - 1.49   GLUCOSE, CAPILLARY     Status: Abnormal   Collection Time   05/19/12  7:14 PM      Component  Value Range Comment   Glucose-Capillary 44 (*) 70 - 99 mg/dL   GLUCOSE, CAPILLARY     Status: Normal   Collection Time   05/19/12  7:29 PM      Component Value Range Comment   Glucose-Capillary 99  70 - 99 mg/dL   GLUCOSE, CAPILLARY     Status: Abnormal   Collection Time   05/19/12  8:33 PM      Component Value Range Comment   Glucose-Capillary 137 (*) 70 - 99 mg/dL   GLUCOSE, CAPILLARY     Status: Abnormal   Collection Time   05/19/12 10:27 PM      Component Value Range Comment   Glucose-Capillary 128 (*) 70 - 99 mg/dL     Dg Chest 1 View  16/02/9603  *RADIOLOGY REPORT*  Clinical Data: Larey Seat from bed  CHEST - 1 VIEW  Comparison: 02/27/2012  Findings: Left jugular dual-lumen central venous catheter tip projecting over cavoatrial junction. Enlargement of cardiac silhouette with pulmonary vascular congestion. Mild diffuse increased interstitial markings in both lungs question minimal edema. No segmental consolidation, pleural effusion or pneumothorax. Bones demineralized.  IMPRESSION: Enlargement of cardiac silhouette with pulmonary vascular congestion. Question minimal pulmonary edema.   Original Report Authenticated By: Ulyses Southward, M.D.    Dg Femur Left  05/19/2012  *RADIOLOGY REPORT*  Clinical Data: Fall  LEFT FEMUR - 2 VIEW  Comparison: None.  Findings: Three views of the left femur submitted.  There is displaced comminuted fracture in distal left femoral shaft.  IMPRESSION: Displaced comminuted fracture distal left femoral shaft.   Original Report Authenticated By: Natasha Mead, M.D.    Dg Tibia/fibula Left  05/19/2012  *RADIOLOGY REPORT*  Clinical Data: Fall  LEFT TIBIA AND FIBULA - 2 VIEW  Comparison: None.  Findings: Two views of the left tibia-fibula submitted.  No acute fracture or subluxation.  Atherosclerotic vascular calcifications are noted.  IMPRESSION: No acute fracture or subluxation.   Original Report Authenticated By: Natasha Mead, M.D.    Dg Knee Complete 4 Views  Left  05/19/2012  *RADIOLOGY REPORT*  Clinical Data: Fall  LEFT KNEE - COMPLETE 4+ VIEW  Comparison: Left femur same day  Findings: Two views of the left knee submitted.  No knee fracture or subluxation.  Again noted displaced comminuted fracture distal left femoral shaft.  IMPRESSION: No knee fracture or subluxation.  Again noted displaced comminuted fracture distal left femoral shaft.   Original Report Authenticated By: Natasha Mead, M.D.     Review of Systems  Constitutional: Negative for fever and chills.  Eyes: Negative for blurred vision and double vision.  Respiratory: Negative for shortness of breath.   Cardiovascular: Negative for chest pain and palpitations.  Gastrointestinal: Negative for nausea, vomiting and abdominal pain.  Genitourinary: Negative for dysuria.  Musculoskeletal:       + L knee pain   Neurological: Negative for sensory change, speech change and headaches.   Blood pressure 101/72, pulse 100, temperature 99.2 F (37.3 C), temperature source Oral, resp. rate 20, height 5\' 3"  (1.6 m), weight 79 kg (174 lb 2.6 oz), SpO2 94.00%. Physical Exam  Constitutional: [name] oriented to person, place, and time.  Elderly appearing female. Alert and awake. Cooperative. Mild distress. Appears uncomfortable  HENT:  Head: Normocephalic.  Mouth/Throat: Oropharynx is clear and moist.  Cardiovascular: S1 normal and S2 normal.   Respiratory:       Occasional wheezing left chest at the base  GI:       Soft, nontender, + bowel sounds  Musculoskeletal:       Bilateral upper extremities   No acute findings   Motor and sensory exam intact   Palpable pulses noted.    Right lower extremity   No acute findings Motor and sensory functions are grossly intact  Palpable dorsalis pedis pulse noted.   Profound vascular changes noted to the distal extremities.   Palpable pulse noted   Extremity is warm  Left lower extremity   Patient is in a long-leg splint   Ace wrap applied    Peripheral vascular changes noted to the distal extremity   The patient is able to perform active toe flexion and extension without significant difficulty.   Reports intact sensation along the deep peroneal nerve, superficial peroneal nerve and tibial nerve.   Palpable pulse noted   Do not remove Ace wrap or dressing to evaluate soft tissue   Hip is unremarkable      Neurological: [name] alert and oriented to person, place, and time.  Psychiatric:       The patient appear to be in normal state. She does appear to be uncomfortable as a result of her acute injury.    Assessment/Plan:  66 year old female status post ground level fall with multiple medical comorbidities  1. Ground-level fall  Highly suspicious for her syncopal episode as patient does not recall the event  Defer workup to medicine but suspect the patient will need an echo during her hospitalization, the patient did have an echo in 2012 results are listed below. Again patient may need an updated echo in the setting of an acute fall with fracture to evaluate for any  contributory causes for possible syncopal episode   Do not see that an EKG has been done I will also order this before surgery       Patient did have an echo back in 2012 with the results as follows:    Study Conclusions  - Left ventricle: The cavity size was normal. Wall thickness was   increased increased in a pattern of mild to moderate LVH. Systolic   function was normal. The estimated ejection fraction was in the   range of 60% to 65%. Wall motion was normal; there were no   regional wall motion abnormalities. Doppler parameters are   consistent with abnormal left ventricular relaxation (grade 1   diastolic dysfunction). - Aortic valve: Mildly calcified annulus. Trileaflet; mildly   calcified leaflets. No significant regurgitation. - Mitral valve: Calcified annulus. Mild regurgitation. - Left atrium: The atrium was at the upper limits of normal in  size. - Tricuspid valve: Trivial regurgitation. - Pericardium, extracardiac: A trivial pericardial effusion was   identified.  2.  left supracondylar distal femur fracture  OR tomorrow for intramedullary nailing  Patient will have unrestricted range of motion of surgery  given her end-stage renal disease, COPD, thyroid disease along with their associated medications and treatments the patient likely has poor bone quality and I would expect healing to take at least 12 weeks.   patient will be nonweightbearing on her left leg for 8 weeks or so.  PT OT consults after surgery  Patient will likely  need nursing facility care after her acute hospital stay  Ice as needed  Continue with bedrest and splint for now  3. metabolic bone workup  Again poor bone quality likely achievable to her numerous comorbidities particularly end-stage renal disease. She may in fact have a component of renal osteodystrophy. Further supplementation and treatment should be followed by nephrologist.  We will check her vitamin D and PTH levels I loss she is in the hospital as well as her TSH levels.  4. Pain  IV Tylenol scheduled  IV Dilaudid prn  Oxycodone prn  5. FEN  N.p.o. 6. DVT and PE prophylaxis  SCDs/foot pumps for now  Pharmacologic prophylaxis after surgery  Coumadin postoperatively as patient has very poor creatinine clearance 7. Medical issues  Per primary team  Pt will need dialysis perioperatively, preferably before, if she did indeed miss her appointment for dialysis this past Friday, may need to delay case later on in the day. Will f/u later this am  8. Disposition  OR tomorrow for IM nailing of  left distal femur  check routine preoperative labs including urinalysis, type and screen, PT/INR, repeat CBC and cmet in the morning   Mearl Latin, PA-C [facility] [phone] (P) 05/19/2012, 11:31 PM

## 2012-05-19 NOTE — ED Provider Notes (Signed)
History   This chart was scribed for Sheila Octave, MD by Leone Payor, ED Scribe. This patient was seen in room APA04/APA04 and the patient's care was started at 1626.   CSN: [id number]  Arrival date & time 05/19/12  1622   First MD Initiated Contact with Patient 05/19/12 1626      Chief Complaint  Patient presents with  . Fall     The history is provided by the patient. No language interpreter was used.    [name] is a 66 y.o. female brought in by ambulance, who presents to the Emergency Department complaining of a new, sudden fall this morning with associated left knee pain and swelling. Pt denies hitting her head or LOC and she states she cannot walk due to pain in the knee. Pt denies any stomach pain, chest pain, neck pain, back pain, vomiting. She does not have any heart or lung problems.    Pt has h/o DM, HTN, thyroid disease, dialysis care.  Pt is a current everyday smoker but denies alcohol use. Past Medical History  Diagnosis Date  . Dialysis care     M-W-F dialysis at [facility]  . COPD (chronic obstructive pulmonary disease)   . Diabetes mellitus   . Hypertension   . Thyroid disease   . Hyperlipidemia   . Bronchitis   . Leg pain   . Renal disorder     Past Surgical History  Procedure Date  . Cholecystectomy   . Dg av dialysis shunt access exist*r* or     working right HD catheter  . Av fistula placement 05/18/2010    No family history on file.  History  Substance Use Topics  . Smoking status: Current Every Day Smoker -- 0.5 packs/day for 30 years    Types: Cigarettes  . Smokeless tobacco: Not on file  . Alcohol Use: No    No OB history provided.   Review of Systems  A complete 10 system review of systems was obtained and all systems are negative except as noted in the HPI and PMH.    Allergies  Contrast media  Home Medications   Current Outpatient Rx  Name  Route  Sig  Dispense  Refill  . BISACODYL 5 MG PO  TBEC   Oral   Take 1 tablet (5 mg total) by mouth 2 (two) times daily.   14 tablet   0   . DOXAZOSIN MESYLATE 8 MG PO TABS   Oral   Take 8 mg by mouth at bedtime.           Marland Kitchen FLUTICASONE PROPIONATE 50 MCG/ACT NA SUSP   Nasal   Place 2 sprays into the nose Daily.         Marland Kitchen GLIPIZIDE 10 MG PO TABS   Oral   Take 10 mg by mouth 2 (two) times daily before a meal.           . INSULIN LISPRO PROT & LISPRO (75-25) 100 UNIT/ML [place] SUSP   Subcutaneous   Inject 15-35 Units into the skin. Patient takes 35 units every morning and 15 units in the evening          . LEVOTHYROXINE SODIUM 75 MCG PO TABS   Oral   Take 75 mcg by mouth daily.           Marland Kitchen LISINOPRIL 20 MG PO TABS   Oral   Take 20 mg by mouth every evening.          Marland Kitchen  RENA-VITE PO TABS   Oral   Take 1 tablet by mouth daily.         Marland Kitchen NIACIN ER (ANTIHYPERLIPIDEMIC) 500 MG PO TBCR   Oral   Take 500 mg by mouth daily.            BP 112/48  Pulse 96  Temp 98 F (36.7 C)  Resp 16  Ht 5\' 3"  (1.6 m)  Wt 178 lb (80.74 kg)  BMI 31.53 kg/m2  SpO2 98%  Physical Exam  Nursing note and vitals reviewed. Constitutional: She appears well-developed and well-nourished.  HENT:  Head: Normocephalic and atraumatic.  Eyes: Conjunctivae normal are normal. Pupils are equal, round, and reactive to light.  Neck: Neck supple. No tracheal deviation present. No thyromegaly present.  Cardiovascular: Normal rate and regular rhythm.   No murmur heard.      2+ DP and PT pulses.   Pulmonary/Chest: Effort normal and breath sounds normal.       Pt has dialysis catheter to the left chest.   Abdominal: Soft. Bowel sounds are normal. She exhibits no distension. There is no tenderness.  Musculoskeletal: Normal range of motion. She exhibits no edema and no tenderness.       No pain to palpation of ankle. Swelling and tenderness over patella and distal femur with medial deformity. Reduced ROM of knee.   Neurological: [name] alert.  Coordination normal.  Skin: Skin is warm and dry. No rash noted.  Psychiatric: She has a normal mood and affect.    ED Course  Procedures (including critical care time)  DIAGNOSTIC STUDIES: Oxygen Saturation is 93% on room air, adequate by my interpretation.    COORDINATION OF CARE:  4:39 PM Discussed treatment plan which includes x-ray with pt at bedside and pt agreed to plan.    Labs Reviewed  CBC WITH DIFFERENTIAL - Abnormal; Notable for the following:    WBC 19.2 (*)     Hemoglobin 11.3 (*)     MCH 25.9 (*)     RDW 17.4 (*)     Platelets 127 (*)     All other components within normal limits  COMPREHENSIVE METABOLIC PANEL - Abnormal; Notable for the following:    Potassium 3.4 (*)  DELTA CHECK NOTED   Glucose, Bld 48 (*)     BUN 35 (*)  DELTA CHECK NOTED   Creatinine, Ser 6.97 (*)     Calcium 8.0 (*)     Albumin 2.8 (*)     Alkaline Phosphatase 165 (*)     GFR calc non Af Amer 5 (*)     GFR calc Af Amer 6 (*)     All other components within normal limits  PROTIME-INR   Ct Abdomen Pelvis Wo Contrast  05/17/2012  *RADIOLOGY REPORT*  Clinical Data: Rectal bleeding and lower abdominal pain.  CT ABDOMEN AND PELVIS WITHOUT CONTRAST  Technique:  Multidetector CT imaging of the abdomen and pelvis was performed following the standard protocol without intravenous contrast.  Comparison: CT abdomen pelvis 08/19/2009  Findings: At the lung base, heavy coronary artery atherosclerotic calcification is noted.  The heart is not completely imaged.  The visualized lungs are clear.  The noncontrast appearance of the liver, spleen, adrenal glands, and pancreas is within normal limits.  A 4.5 x 3.7 centimeter water density lesion in the mid pole of the left kidney statistically most likely a renal cyst, but not completely characterized on noncontrast CT.  There are multiple hyperdense rounded lesions  associated with the left renal cortex, measuring in the range of 7- 8 mm, which may be  hyperdense cysts.  A few calcifications associated with both kidneys are favored to be vascular calcifications.  There is no hydronephrosis.  A hyperdense lesion associated the lower pole of the right kidney measures 10 mm.  There is extensive atherosclerotic calcification of the aortoiliac vasculature.  There is infrarenal abdominal aortic ectasia measuring up to 2.2 cm AP diameter.  Negative for aneurysm.  The appendix is identified, and courses posterior to the cecum. Proximally, the appendix measures 8-9 mm in diameter, mildly prominent.  The distal tip of the appendix measures 5 mm and contains a small locule of gas within its lumen.  The proximal aspect of the appendix does not contain gas.  No periappendiceal inflammatory stranding is appreciated.  There is extensive diverticulosis of the sigmoid colon.  There is some sigmoid wall thickening (for example image number 58).  No discrete inflammatory stranding is seen in association with the diverticula.  There is a moderate amount of stool in the colon. There is no evidence of bowel obstruction.  The uterus is retroverted and contains a few coarse calcifications, suggesting the presence of uterine fibroids.  Urinary bladder is unremarkable.  There is degenerative disc disease at L5-S1.  Vertebral bodies are normal in height and alignment.  IMPRESSION:  1.  Early appendicitis cannot be excluded, but is not definite. The proximal appendix measures prominent in caliber, up to 8 to 9 mm, but it is noted that the proximal appendix measured approximately 8 mm on the prior CT of March 2011.  No periappendiceal inflammatory stranding is seen.  Suggest clinical correlation, and follow-up imaging as warranted. 2.  Diverticulosis of the sigmoid colon without definite CT evidence of acute diverticulitis.  In the region of the diverticula, there is suggestion of sigmoid colon wall thickening, which may be secondary to muscular hypertrophy related to diverticulosis.  Acute  colitis or underlying neoplasm cannot be excluded. 3.  4.6 cm water density left renal mass.  This is likely renal cyst.  This could be confirmed with renal ultrasound is desired. 4.  Several small bilateral, left greater than right, high density renal lesions likely reflect complex renal cysts.  These cannot be completely characterized on noncontrast CT. 5.  Aortoiliac atherosclerosis with ectasia of the infrarenal abdominal aorta.   Original Report Authenticated By: Britta Mccreedy, M.D.    Dg Chest 1 View  05/19/2012  *RADIOLOGY REPORT*  Clinical Data: Larey Seat from bed  CHEST - 1 VIEW  Comparison: 02/27/2012  Findings: Left jugular dual-lumen central venous catheter tip projecting over cavoatrial junction. Enlargement of cardiac silhouette with pulmonary vascular congestion. Mild diffuse increased interstitial markings in both lungs question minimal edema. No segmental consolidation, pleural effusion or pneumothorax. Bones demineralized.  IMPRESSION: Enlargement of cardiac silhouette with pulmonary vascular congestion. Question minimal pulmonary edema.   Original Report Authenticated By: Ulyses Southward, M.D.    Dg Femur Left  05/19/2012  *RADIOLOGY REPORT*  Clinical Data: Fall  LEFT FEMUR - 2 VIEW  Comparison: None.  Findings: Three views of the left femur submitted.  There is displaced comminuted fracture in distal left femoral shaft.  IMPRESSION: Displaced comminuted fracture distal left femoral shaft.   Original Report Authenticated By: Natasha Mead, M.D.    Dg Tibia/fibula Left  05/19/2012  *RADIOLOGY REPORT*  Clinical Data: Fall  LEFT TIBIA AND FIBULA - 2 VIEW  Comparison: None.  Findings: Two views of the left tibia-fibula  submitted.  No acute fracture or subluxation.  Atherosclerotic vascular calcifications are noted.  IMPRESSION: No acute fracture or subluxation.   Original Report Authenticated By: Natasha Mead, M.D.    Dg Knee Complete 4 Views Left  05/19/2012  *RADIOLOGY REPORT*  Clinical Data: Fall   LEFT KNEE - COMPLETE 4+ VIEW  Comparison: Left femur same day  Findings: Two views of the left knee submitted.  No knee fracture or subluxation.  Again noted displaced comminuted fracture distal left femoral shaft.  IMPRESSION: No knee fracture or subluxation.  Again noted displaced comminuted fracture distal left femoral shaft.   Original Report Authenticated By: Natasha Mead, M.D.      1. Femur fracture       MDM  Fall from bed onto left knee. Did not hit head or lose consciousness. Pain and swelling to distal femur and knee joint. Unable to bear weight. last dialysis yesterday.  Patient has pain to palpation over left distal femur and knee. She has palpable distal pulses. Patient seen 2 days ago for abdominal pain and had CT that questioned early appendicitis.   X-ray shows distal femur fracture. Discussed with orthopedist on call at [facility] Dr. Carola Frost, who would like hospitalist admit the patient. Patient denies abdominal pain, vomiting.  WBC 19 from 12, possibly from stress of acute fracture. No orthopedics at [facility] this weekend.   CRITICAL CARE Performed by: [name]   Total critical care time: 30  Critical care time was exclusive of separately billable procedures and treating other patients.  Critical care was necessary to treat or prevent imminent or life-threatening deterioration.  Critical care was time spent personally by me on the following activities: development of treatment plan with patient and/or surrogate as well as nursing, discussions with consultants, evaluation of patient's response to treatment, examination of patient, obtaining history from patient or surrogate, ordering and performing treatments and interventions, ordering and review of laboratory studies, ordering and review of radiographic studies, pulse oximetry and re-evaluation of patient's condition.  I personally performed the services described in this documentation, which was scribed in  my presence. The recorded information has been reviewed and is accurate.    Sheila Octave, MD 05/19/12 [phone]

## 2012-05-19 NOTE — ED Notes (Signed)
Meal offered to pt, who declines.

## 2012-05-19 NOTE — ED Notes (Signed)
Pt via EMS from home after fall this morning. Pt denies hitting head only complaint of left knee pain Swelling noted to left knee immoblized with splint.

## 2012-05-20 ENCOUNTER — Inpatient Hospital Stay (HOSPITAL_COMMUNITY): Payer: Medicare Other

## 2012-05-20 ENCOUNTER — Encounter (HOSPITAL_COMMUNITY)
Admission: EM | Disposition: A | Discharge status: Skilled Nursing Facility | Payer: Self-pay | Source: Home / Self Care | Attending: Internal Medicine

## 2012-05-20 ENCOUNTER — Encounter (HOSPITAL_COMMUNITY): Payer: Self-pay | Admitting: Anesthesiology

## 2012-05-20 ENCOUNTER — Inpatient Hospital Stay (HOSPITAL_COMMUNITY): Payer: Medicare Other | Admitting: Anesthesiology

## 2012-05-20 ENCOUNTER — Encounter (HOSPITAL_COMMUNITY): Payer: Self-pay | Admitting: *Deleted

## 2012-05-20 HISTORY — PX: FEMUR IM NAIL: SHX1597

## 2012-05-20 LAB — CBC
MCH: 24.9 pg — ABNORMAL LOW (ref 26.0–34.0)
MCV: 83.1 fL (ref 78.0–100.0)
Platelets: 114 10*3/uL — ABNORMAL LOW (ref 150–400)
RBC: 3.97 MIL/uL (ref 3.87–5.11)

## 2012-05-20 LAB — BASIC METABOLIC PANEL
BUN: 37 mg/dL — ABNORMAL HIGH (ref 6–23)
Chloride: 92 mEq/L — ABNORMAL LOW (ref 96–112)
Creatinine, Ser: 7.31 mg/dL — ABNORMAL HIGH (ref 0.50–1.10)
GFR calc Af Amer: 6 mL/min — ABNORMAL LOW (ref >90)
GFR calc non Af Amer: 5 mL/min — ABNORMAL LOW (ref >90)

## 2012-05-20 LAB — GLUCOSE, CAPILLARY: Glucose-Capillary: 205 mg/dL — ABNORMAL HIGH (ref 70–99)

## 2012-05-20 LAB — PROTIME-INR: Prothrombin Time: 13.5 seconds (ref 11.6–15.2)

## 2012-05-20 SURGERY — INSERTION, INTRAMEDULLARY ROD, FEMUR, RETROGRADE
Anesthesia: General | Site: Leg Upper | Laterality: Left | Wound class: Clean

## 2012-05-20 MED ORDER — ONDANSETRON HCL 4 MG/2ML IJ SOLN
4.0000 mg | Freq: Four times a day (QID) | INTRAMUSCULAR | Status: DC | PRN
  Administered 2012-05-20: 4 mg via INTRAVENOUS
  Filled 2012-05-20: qty 2

## 2012-05-20 MED ORDER — LIDOCAINE HCL (CARDIAC) 20 MG/ML IV SOLN
INTRAVENOUS | Status: DC | PRN
  Administered 2012-05-20: 40 mg via INTRAVENOUS

## 2012-05-20 MED ORDER — ONDANSETRON HCL 4 MG/2ML IJ SOLN
INTRAMUSCULAR | Status: DC | PRN
  Administered 2012-05-20: 4 mg via INTRAVENOUS

## 2012-05-20 MED ORDER — HYDROMORPHONE HCL PF 1 MG/ML IJ SOLN
INTRAMUSCULAR | Status: AC
  Filled 2012-05-20: qty 1

## 2012-05-20 MED ORDER — PHENYLEPHRINE HCL 10 MG/ML IJ SOLN
10.0000 mg | INTRAVENOUS | Status: DC | PRN
  Administered 2012-05-20: 25 ug/min via INTRAVENOUS

## 2012-05-20 MED ORDER — METHOCARBAMOL 500 MG PO TABS
500.0000 mg | ORAL_TABLET | Freq: Four times a day (QID) | ORAL | Status: DC | PRN
  Administered 2012-05-20: 500 mg via ORAL
  Filled 2012-05-20 (×3): qty 1

## 2012-05-20 MED ORDER — ACETAMINOPHEN 10 MG/ML IV SOLN
INTRAVENOUS | Status: AC
  Filled 2012-05-20: qty 100

## 2012-05-20 MED ORDER — MENTHOL 3 MG MT LOZG
1.0000 | LOZENGE | OROMUCOSAL | Status: DC | PRN
  Filled 2012-05-20: qty 9

## 2012-05-20 MED ORDER — ACETAMINOPHEN 10 MG/ML IV SOLN: 1000.0000 mg | Freq: Four times a day (QID) | INTRAVENOUS | Status: DC

## 2012-05-20 MED ORDER — ACETAMINOPHEN 10 MG/ML IV SOLN
1000.0000 mg | Freq: Four times a day (QID) | INTRAVENOUS | Status: AC
  Administered 2012-05-20 (×4): 1000 mg via INTRAVENOUS
  Filled 2012-05-20 (×4): qty 100

## 2012-05-20 MED ORDER — ALBUMIN NICU 25% IV SOLUTION: 2.0000 mL/kg | Freq: Once | INTRAVENOUS | Status: DC

## 2012-05-20 MED ORDER — CEFAZOLIN SODIUM-DEXTROSE 2-3 GM-% IV SOLR
INTRAVENOUS | Status: DC | PRN
  Administered 2012-05-20: 2 g via INTRAVENOUS

## 2012-05-20 MED ORDER — ARTIFICIAL TEARS OP OINT
TOPICAL_OINTMENT | OPHTHALMIC | Status: DC | PRN
  Administered 2012-05-20: 1 via OPHTHALMIC

## 2012-05-20 MED ORDER — GLYCOPYRROLATE 0.2 MG/ML IJ SOLN
INTRAMUSCULAR | Status: DC | PRN
  Administered 2012-05-20: .8 mg via INTRAVENOUS

## 2012-05-20 MED ORDER — OXYCODONE HCL 5 MG PO TABS
5.0000 mg | ORAL_TABLET | ORAL | Status: DC | PRN
  Administered 2012-05-20: 10 mg via ORAL
  Filled 2012-05-20: qty 2

## 2012-05-20 MED ORDER — NEOSTIGMINE METHYLSULFATE 1 MG/ML IJ SOLN
INTRAMUSCULAR | Status: DC | PRN
  Administered 2012-05-20: 5 mg via INTRAVENOUS

## 2012-05-20 MED ORDER — OXYCODONE HCL 5 MG/5ML PO SOLN
5.0000 mg | Freq: Once | ORAL | Status: DC | PRN
Start: 2012-05-20 — End: 2012-05-20

## 2012-05-20 MED ORDER — OXYCODONE HCL 5 MG PO TABS: 5.0000 mg | ORAL_TABLET | Freq: Once | ORAL | Status: DC | PRN

## 2012-05-20 MED ORDER — ETOMIDATE 2 MG/ML IV SOLN
INTRAVENOUS | Status: DC | PRN
  Administered 2012-05-20: 18 mg via INTRAVENOUS

## 2012-05-20 MED ORDER — DOCUSATE SODIUM 100 MG PO CAPS
100.0000 mg | ORAL_CAPSULE | Freq: Two times a day (BID) | ORAL | Status: DC
  Administered 2012-05-20 – 2012-05-23 (×5): 100 mg via ORAL
  Filled 2012-05-20 (×11): qty 1

## 2012-05-20 MED ORDER — FENTANYL CITRATE 0.05 MG/ML IJ SOLN
INTRAMUSCULAR | Status: DC | PRN
  Administered 2012-05-20 (×2): 50 ug via INTRAVENOUS
  Administered 2012-05-20: 100 ug via INTRAVENOUS
  Administered 2012-05-20 (×2): 50 ug via INTRAVENOUS

## 2012-05-20 MED ORDER — DEXTROSE 5 % IV SOLN
500.0000 mg | Freq: Four times a day (QID) | INTRAVENOUS | Status: DC | PRN
  Filled 2012-05-20 (×2): qty 5

## 2012-05-20 MED ORDER — ONDANSETRON HCL 4 MG PO TABS
4.0000 mg | ORAL_TABLET | Freq: Four times a day (QID) | ORAL | Status: DC | PRN
  Administered 2012-05-21 – 2012-05-30 (×3): 4 mg via ORAL
  Filled 2012-05-20 (×3): qty 1

## 2012-05-20 MED ORDER — ONDANSETRON HCL 4 MG/2ML IJ SOLN: 4.0000 mg | Freq: Once | INTRAMUSCULAR | Status: DC | PRN

## 2012-05-20 MED ORDER — METOCLOPRAMIDE HCL 5 MG/ML IJ SOLN
5.0000 mg | Freq: Three times a day (TID) | INTRAMUSCULAR | Status: DC | PRN
  Filled 2012-05-20: qty 2

## 2012-05-20 MED ORDER — METOCLOPRAMIDE HCL 5 MG PO TABS
5.0000 mg | ORAL_TABLET | Freq: Three times a day (TID) | ORAL | Status: DC | PRN
  Filled 2012-05-20 (×2): qty 2

## 2012-05-20 MED ORDER — ROCURONIUM BROMIDE 100 MG/10ML IV SOLN
INTRAVENOUS | Status: DC | PRN
  Administered 2012-05-20: 50 mg via INTRAVENOUS

## 2012-05-20 MED ORDER — HYDROMORPHONE HCL PF 1 MG/ML IJ SOLN
0.2500 mg | INTRAMUSCULAR | Status: DC | PRN
  Administered 2012-05-20: 0.25 mg via INTRAVENOUS
  Administered 2012-05-20: 0.5 mg via INTRAVENOUS
  Administered 2012-05-20: 0.25 mg via INTRAVENOUS

## 2012-05-20 MED ORDER — FENTANYL CITRATE 0.05 MG/ML IJ SOLN
INTRAMUSCULAR | Status: AC
  Filled 2012-05-20: qty 2

## 2012-05-20 MED ORDER — CEFAZOLIN SODIUM 1-5 GM-% IV SOLN
1.0000 g | Freq: Two times a day (BID) | INTRAVENOUS | Status: AC
  Administered 2012-05-20: 1 g via INTRAVENOUS
  Filled 2012-05-20: qty 50

## 2012-05-20 MED ORDER — POLYETHYLENE GLYCOL 3350 17 G PO PACK
17.0000 g | PACK | Freq: Every day | ORAL | Status: DC
  Administered 2012-05-22 – 2012-05-23 (×2): 17 g via ORAL
  Filled 2012-05-20 (×7): qty 1

## 2012-05-20 MED ORDER — CEFAZOLIN SODIUM 1-5 GM-% IV SOLN
1.0000 g | Freq: Once | INTRAVENOUS | Status: AC
  Administered 2012-05-20: 1 g via INTRAVENOUS
  Filled 2012-05-20: qty 50

## 2012-05-20 MED ORDER — HYDROMORPHONE HCL PF 1 MG/ML IJ SOLN
1.0000 mg | INTRAMUSCULAR | Status: DC | PRN
  Administered 2012-05-20 – 2012-05-21 (×3): 1 mg via INTRAVENOUS
  Filled 2012-05-20: qty 1
  Filled 2012-05-20: qty 40
  Filled 2012-05-20 (×2): qty 1

## 2012-05-20 MED ORDER — PHENOL 1.4 % MT LIQD
1.0000 | OROMUCOSAL | Status: DC | PRN
  Filled 2012-05-20 (×2): qty 177

## 2012-05-20 MED ORDER — ALBUMIN HUMAN 25 % IV SOLN
25.0000 g | INTRAVENOUS | Status: AC | PRN
  Administered 2012-05-20 (×2): via INTRAVENOUS
  Filled 2012-05-20: qty 100

## 2012-05-20 MED ORDER — SODIUM CHLORIDE 0.9 % IV SOLN
INTRAVENOUS | Status: DC | PRN
  Administered 2012-05-20: 07:00:00 via INTRAVENOUS

## 2012-05-20 MED ORDER — DEXTROSE 5 % IV SOLN
INTRAVENOUS | Status: DC | PRN
  Administered 2012-05-20: 09:00:00 via INTRAVENOUS

## 2012-05-20 SURGICAL SUPPLY — 68 items
BANDAGE ELASTIC 4 VELCRO ST LF (GAUZE/BANDAGES/DRESSINGS) ×2 IMPLANT
BANDAGE ELASTIC 6 VELCRO ST LF (GAUZE/BANDAGES/DRESSINGS) ×2 IMPLANT
BANDAGE ESMARK 6X9 LF (GAUZE/BANDAGES/DRESSINGS) IMPLANT
BANDAGE GAUZE ELAST BULKY 4 IN (GAUZE/BANDAGES/DRESSINGS) ×2 IMPLANT
BIT DRILL CALIBRATED 4.3MMX365 (DRILL) ×1 IMPLANT
BIT DRILL CROWE PNT TWST 4.5MM (DRILL) ×1 IMPLANT
BNDG COHESIVE 4X5 TAN STRL (GAUZE/BANDAGES/DRESSINGS) ×2 IMPLANT
BNDG ESMARK 6X9 LF (GAUZE/BANDAGES/DRESSINGS)
BRUSH SCRUB DISP (MISCELLANEOUS) ×8 IMPLANT
CLOTH BEACON ORANGE TIMEOUT ST (SAFETY) ×2 IMPLANT
COVER MAYO STAND STRL (DRAPES) ×2 IMPLANT
COVER SURGICAL LIGHT HANDLE (MISCELLANEOUS) ×4 IMPLANT
DRAPE C-ARM 42X72 X-RAY (DRAPES) ×2 IMPLANT
DRAPE C-ARMOR (DRAPES) ×2 IMPLANT
DRAPE INCISE IOBAN 66X45 STRL (DRAPES) ×2 IMPLANT
DRAPE ORTHO SPLIT 77X108 STRL (DRAPES) ×2
DRAPE SURG ORHT 6 SPLT 77X108 (DRAPES) ×2 IMPLANT
DRAPE U-SHAPE 47X51 STRL (DRAPES) ×2 IMPLANT
DRILL CALIBRATED 4.3MMX365 (DRILL) ×2
DRILL CROWE POINT TWIST 4.5MM (DRILL) ×2
DRSG ADAPTIC 3X8 NADH LF (GAUZE/BANDAGES/DRESSINGS) ×2 IMPLANT
DRSG PAD ABDOMINAL 8X10 ST (GAUZE/BANDAGES/DRESSINGS) ×2 IMPLANT
ELECT REM PT RETURN 9FT ADLT (ELECTROSURGICAL) ×2
ELECTRODE REM PT RTRN 9FT ADLT (ELECTROSURGICAL) ×1 IMPLANT
EVACUATOR 1/8 PVC DRAIN (DRAIN) IMPLANT
GAUZE XEROFORM 1X8 LF (GAUZE/BANDAGES/DRESSINGS) ×2 IMPLANT
GLOVE BIO SURGEON STRL SZ7 (GLOVE) ×2 IMPLANT
GLOVE BIO SURGEON STRL SZ7.5 (GLOVE) ×4 IMPLANT
GLOVE BIO SURGEON STRL SZ8 (GLOVE) ×2 IMPLANT
GLOVE BIOGEL PI IND STRL 6.5 (GLOVE) ×1 IMPLANT
GLOVE BIOGEL PI IND STRL 7.5 (GLOVE) ×1 IMPLANT
GLOVE BIOGEL PI IND STRL 8 (GLOVE) ×1 IMPLANT
GLOVE BIOGEL PI INDICATOR 6.5 (GLOVE) ×1
GLOVE BIOGEL PI INDICATOR 7.5 (GLOVE) ×1
GLOVE BIOGEL PI INDICATOR 8 (GLOVE) ×1
GLOVE SURG SS PI 6.5 STRL IVOR (GLOVE) ×2 IMPLANT
GOWN PREVENTION PLUS XLARGE (GOWN DISPOSABLE) ×2 IMPLANT
GOWN STRL NON-REIN LRG LVL3 (GOWN DISPOSABLE) ×4 IMPLANT
GUIDEPIN 3.2X17.5 THRD DISP (PIN) ×2 IMPLANT
GUIDEWIRE BEAD TIP (WIRE) ×2 IMPLANT
KIT BASIN OR (CUSTOM PROCEDURE TRAY) ×2 IMPLANT
KIT ROOM TURNOVER OR (KITS) ×2 IMPLANT
NAIL FEM RETRO 12X380 (Nail) ×2 IMPLANT
PACK ORTHO EXTREMITY (CUSTOM PROCEDURE TRAY) ×2 IMPLANT
PAD ARMBOARD 7.5X6 YLW CONV (MISCELLANEOUS) ×4 IMPLANT
PADDING CAST ABS 4INX4YD NS (CAST SUPPLIES) ×1
PADDING CAST ABS COTTON 4X4 ST (CAST SUPPLIES) ×1 IMPLANT
REAMER ONE STEP 12.2MM (TRAUMA) ×2 IMPLANT
SCREW CORT TI DBL LEAD 5X30 (Screw) ×2 IMPLANT
SCREW CORT TI DBL LEAD 5X36 (Screw) ×2 IMPLANT
SCREW CORT TI DBL LEAD 5X50 (Screw) ×2 IMPLANT
SCREW CORT TI DBL LEAD 5X65 (Screw) ×2 IMPLANT
SCREW CORT TI DBL LEAD 5X75 (Screw) ×2 IMPLANT
SPONGE GAUZE 4X4 12PLY (GAUZE/BANDAGES/DRESSINGS) ×2 IMPLANT
SPONGE LAP 18X18 X RAY DECT (DISPOSABLE) ×2 IMPLANT
STAPLER VISISTAT 35W (STAPLE) ×4 IMPLANT
STOCKINETTE IMPERVIOUS LG (DRAPES) ×2 IMPLANT
SUT ETHILON 3 0 PS 1 (SUTURE) ×2 IMPLANT
SUT PROLENE 3 0 PS 2 (SUTURE) IMPLANT
SUT VIC AB 0 CT1 27 (SUTURE) ×1
SUT VIC AB 0 CT1 27XBRD ANBCTR (SUTURE) ×1 IMPLANT
SUT VIC AB 2-0 CT1 27 (SUTURE) ×1
SUT VIC AB 2-0 CT1 TAPERPNT 27 (SUTURE) ×1 IMPLANT
SUT VIC AB 2-0 CT3 27 (SUTURE) IMPLANT
TOWEL OR 17X24 6PK STRL BLUE (TOWEL DISPOSABLE) ×2 IMPLANT
TOWEL OR 17X26 10 PK STRL BLUE (TOWEL DISPOSABLE) ×2 IMPLANT
TUBE CONNECTING 12X1/4 (SUCTIONS) ×2 IMPLANT
YANKAUER SUCT BULB TIP NO VENT (SUCTIONS) ×2 IMPLANT

## 2012-05-20 NOTE — Consult Note (Signed)
I have seen and examined the patient. I agree with the findings above.  I discussed with the patient the risks and benefits of surgery, including the possibility of infection, nerve injury, vessel injury, wound breakdown, arthritis, symptomatic hardware, DVT/ PE, loss of motion, malunion, nonunion, and need for further surgery among others.  We also specifically discussed the elevated risk of complications because of her refusal of blood products.  She understood these risks and wished to proceed.   Budd Palmer, MD 05/20/2012

## 2012-05-20 NOTE — Brief Op Note (Signed)
05/19/2012 - 05/20/2012  11:12 AM  PATIENT:  [name]  66 y.o. female  PRE-OPERATIVE DIAGNOSIS:  left femoral supracondylar fracture  POST-OPERATIVE DIAGNOSIS:  left femoral supracondylar fracture  PROCEDURE:  Procedure(s) (LRB) with comments: INTRAMEDULLARY (IM) RETROGRADE FEMORAL NAILING (Left) with [employer] [place] 12.5 x 380 mm statically locked  SURGEON:  Surgeon(s) and Role:    * Budd Palmer, MD - Primary  ASSISTANTS: none   ANESTHESIA:   general  EBL:  Total I/O In: 300 [I.V.:200; IV Piggyback:100] Out: 25 [Blood:25]  BLOOD ADMINISTERED:none  DRAINS: none   LOCAL MEDICATIONS USED:  NONE  SPECIMEN:  No Specimen  DISPOSITION OF SPECIMEN:  N/A  COUNTS:  YES  TOURNIQUET:  * No tourniquets in log *  DICTATION: .Other Dictation: Dictation Number [id number]  PLAN OF CARE: Admit to inpatient   PATIENT DISPOSITION:  PACU - hemodynamically stable.   Delay start of Pharmacological VTE agent (>24hrs) due to surgical blood loss or risk of bleeding: no

## 2012-05-20 NOTE — Anesthesia Procedure Notes (Signed)
Procedure Name: Intubation Date/Time: 05/20/2012 8:41 AM Performed by: Tyrone Nine Pre-anesthesia Checklist: Patient identified, Timeout performed, Emergency Drugs available, Suction available and Patient being monitored Patient Re-evaluated:Patient Re-evaluated prior to inductionOxygen Delivery Method: Circle system utilized Preoxygenation: Pre-oxygenation with 100% oxygen Intubation Type: IV induction Ventilation: Mask ventilation without difficulty Laryngoscope Size: Mac and 3 Grade View: Grade II Tube type: Oral Tube size: 7.5 mm Number of attempts: 1 Airway Equipment and Method: Stylet Placement Confirmation: ETT inserted through vocal cords under direct vision,  breath sounds checked- equal and bilateral and positive ETCO2 Secured at: 22 cm Tube secured with: Tape Dental Injury: Teeth and Oropharynx as per pre-operative assessment

## 2012-05-20 NOTE — Progress Notes (Signed)
Patient has been in surgery. Here with a fractured hip following a mechanical fall. Will await PT recs but will likely need SNF.  Peggye Pitt, MD [facility] Pager: [phone]

## 2012-05-20 NOTE — Anesthesia Preprocedure Evaluation (Addendum)
Anesthesia Evaluation  Patient identified by MRN, date of birth, ID band Patient awake    Reviewed: Allergy & Precautions, H&P , NPO status , Patient's Chart, lab work & pertinent test results  Airway Mallampati: I TM Distance: >3 FB Neck ROM: Limited  Mouth opening: Limited Mouth Opening  Dental  (+) Poor Dentition, Edentulous Upper and Dental Advisory Given   Pulmonary asthma , COPD COPD inhaler,  breath sounds clear to auscultation        Cardiovascular hypertension, Pt. on medications + Peripheral Vascular Disease Rhythm:Regular Rate:Normal  20-May-2012 06:07:00 [facility] ROUTINE RECORD Normal sinus rhythm Normal ECG  - Left ventricle: The cavity size was normal. Wall thickness was   increased increased in a pattern of mild to moderate LVH. Systolic   function was normal. The estimated ejection fraction was in the   range of 60% to 65%. Wall motion was normal; there were no   regional wall motion abnormalities. Doppler parameters are   consistent with abnormal left ventricular relaxation (grade 1   diastolic dysfunction). - Aortic valve: Mildly calcified annulus. Trileaflet; mildly   calcified leaflets. No significant regurgitation. - Mitral valve: Calcified annulus. Mild regurgitation. - Left atrium: The atrium was at the upper limits of normal in size. - Tricuspid valve: Trivial regurgitation. - Pericardium, extracardiac: A trivial pericardial effusion was   identified. Transthoracic echocardiography. M-mode, complete 2D, spectral Doppler, and color Doppler. Height: Height: 160cm. Height: 63in. Weight: Weight: 73kg. Weight: 160.7lb. Body mass index: BMI: 28.5kg/m^2. Body surface area 02/10/12   Neuro/Psych    GI/Hepatic   Endo/Other  diabetes, Well Controlled, Type 2  Renal/GU ESRFRenal diseaseDialysis M-W-F  Last dialysis this past Wed. Thru Diatek cath.       Musculoskeletal  (+)  Arthritis -, Osteoarthritis,    Abdominal   Peds  Hematology   Anesthesia Other Findings Pt wishes not to receive Blood for Religious Beliefs  Reproductive/Obstetrics                      Anesthesia Physical Anesthesia Plan  ASA: IV  Anesthesia Plan: General   Post-op Pain Management:    Induction: Intravenous  Airway Management Planned: Oral ETT  Additional Equipment:   Intra-op Plan:   Post-operative Plan: Extubation in OR  Informed Consent: I have reviewed the patients History and Physical, chart, labs and discussed the procedure including the risks, benefits and alternatives for the proposed anesthesia with the patient or authorized representative who has indicated his/her understanding and acceptance.   Dental advisory given  Plan Discussed with: CRNA, Anesthesiologist and Surgeon  Anesthesia Plan Comments:         Anesthesia Quick Evaluation

## 2012-05-20 NOTE — Preoperative (Signed)
Beta Blockers   Reason not to administer Beta Blockers:Not Applicable 

## 2012-05-20 NOTE — Progress Notes (Signed)
PT Cancellation Note  Patient Details Name: [name] MRN: [id number] DOB: [birth year]   Cancelled Treatment:    Reason Eval/Treat Not Completed: Other (comment) (surgery today)   Milana Kidney 05/20/2012, 2:28 PM

## 2012-05-20 NOTE — Anesthesia Postprocedure Evaluation (Signed)
  Anesthesia Post-op Note  Patient: [name]  Procedure(s) Performed: Procedure(s) (LRB) with comments: INTRAMEDULLARY (IM) RETROGRADE FEMORAL NAILING (Left)  Patient Location: PACU  Anesthesia Type:General  Level of Consciousness: awake, alert  and oriented  Airway and Oxygen Therapy: Patient Spontanous Breathing and Patient connected to nasal cannula oxygen  Post-op Pain: mild  Post-op Assessment: Post-op Vital signs reviewed  Post-op Vital Signs: Reviewed  Complications: No apparent anesthesia complications

## 2012-05-20 NOTE — Progress Notes (Signed)
Ortho Addendum  Pt was seen about 3 days ago [facility] for rectal bleed.  This plus her renal disease limit pharmacologic dvt/pe prophylaxis.  Will defer to medicine on agent.  Will place on mechanical pumps for now.  Pt would only need 10-14 day course of prophylaxis as well.    Mearl Latin, PA-C [facility] [phone] (P) 05/20/2012 12:00 PM

## 2012-05-20 NOTE — Transfer of Care (Signed)
Immediate Anesthesia Transfer of Care Note  Patient: [name]  Procedure(s) Performed: Procedure(s) (LRB) with comments: INTRAMEDULLARY (IM) RETROGRADE FEMORAL NAILING (Left)  Patient Location: PACU  Anesthesia Type:General  Level of Consciousness: awake, alert , oriented and patient cooperative  Airway & Oxygen Therapy: Patient Spontanous Breathing and Patient connected to face mask oxygen  Post-op Assessment: Report given to PACU RN and Post -op Vital signs reviewed and stable  Post vital signs: Reviewed and stable  Complications: No apparent anesthesia complications

## 2012-05-20 NOTE — Progress Notes (Signed)
Orthopedic Tech Progress Note Patient Details:  [name] 05/02/1946 [id number]  Patient ID: [name], female   DOB: 1945/06/12, 66 y.o.   MRN: [id number] Trapeze bar patient helper  Nikki Dom 05/20/2012, 2:14 PM

## 2012-05-21 ENCOUNTER — Encounter (HOSPITAL_COMMUNITY): Payer: Self-pay | Admitting: Orthopedic Surgery

## 2012-05-21 LAB — BASIC METABOLIC PANEL
CO2: 22 mEq/L (ref 19–32)
Calcium: 7.5 mg/dL — ABNORMAL LOW (ref 8.4–10.5)
Creatinine, Ser: 10.77 mg/dL — ABNORMAL HIGH (ref 0.50–1.10)
GFR calc non Af Amer: 3 mL/min — ABNORMAL LOW (ref >90)

## 2012-05-21 LAB — GLUCOSE, CAPILLARY
Glucose-Capillary: 256 mg/dL — ABNORMAL HIGH (ref 70–99)
Glucose-Capillary: 116 mg/dL — ABNORMAL HIGH (ref 70–99)

## 2012-05-21 LAB — HEMOGLOBIN A1C: Mean Plasma Glucose: 166 mg/dL — ABNORMAL HIGH (ref <117)

## 2012-05-21 LAB — CBC
MCH: 25.8 pg — ABNORMAL LOW (ref 26.0–34.0)
MCHC: 32 g/dL (ref 30.0–36.0)
MCV: 80.6 fL (ref 78.0–100.0)
Platelets: 117 10*3/uL — ABNORMAL LOW (ref 150–400)
RBC: 3.45 MIL/uL — ABNORMAL LOW (ref 3.87–5.11)
RDW: 17.4 % — ABNORMAL HIGH (ref 11.5–15.5)

## 2012-05-21 LAB — RENAL FUNCTION PANEL
CO2: 21 mEq/L (ref 19–32)
Chloride: 90 mEq/L — ABNORMAL LOW (ref 96–112)
Creatinine, Ser: 10.69 mg/dL — ABNORMAL HIGH (ref 0.50–1.10)
GFR calc Af Amer: 4 mL/min — ABNORMAL LOW (ref >90)
GFR calc non Af Amer: 3 mL/min — ABNORMAL LOW (ref >90)
Glucose, Bld: 287 mg/dL — ABNORMAL HIGH (ref 70–99)

## 2012-05-21 LAB — PTH, INTACT AND CALCIUM
Calcium, Total (PTH): 8.5 mg/dL (ref 8.4–10.5)
PTH: 317 pg/mL — ABNORMAL HIGH (ref 14.0–72.0)

## 2012-05-21 MED ORDER — ACETAMINOPHEN 10 MG/ML IV SOLN
1000.0000 mg | Freq: Four times a day (QID) | INTRAVENOUS | Status: AC
  Administered 2012-05-21: 1000 mg via INTRAVENOUS
  Filled 2012-05-21 (×4): qty 100

## 2012-05-21 MED ORDER — INSULIN GLARGINE 100 UNIT/ML ~~LOC~~ SOLN
10.0000 [IU] | Freq: Every day | SUBCUTANEOUS | Status: DC
  Administered 2012-05-21 – 2012-05-23 (×3): 10 [IU] via SUBCUTANEOUS

## 2012-05-21 MED ORDER — INSULIN ASPART 100 UNIT/ML ~~LOC~~ SOLN
0.0000 [IU] | Freq: Three times a day (TID) | SUBCUTANEOUS | Status: DC
  Administered 2012-05-21: 5 [IU] via SUBCUTANEOUS
  Administered 2012-05-21: 7 [IU] via SUBCUTANEOUS
  Administered 2012-05-22: 2 [IU] via SUBCUTANEOUS
  Administered 2012-05-22: 3 [IU] via SUBCUTANEOUS
  Administered 2012-05-22: 5 [IU] via SUBCUTANEOUS
  Administered 2012-05-23: 2 [IU] via SUBCUTANEOUS
  Administered 2012-05-23: 1 [IU] via SUBCUTANEOUS
  Administered 2012-05-23: 2 [IU] via SUBCUTANEOUS

## 2012-05-21 MED ORDER — HALOPERIDOL LACTATE 5 MG/ML IJ SOLN
2.0000 mg | Freq: Four times a day (QID) | INTRAMUSCULAR | Status: DC | PRN
  Filled 2012-05-21: qty 0.4

## 2012-05-21 MED ORDER — HYDROMORPHONE HCL PF 1 MG/ML IJ SOLN: 1.0000 mg | INTRAMUSCULAR | Status: DC | PRN

## 2012-05-21 MED ORDER — HYDROMORPHONE HCL PF 1 MG/ML IJ SOLN
1.0000 mg | INTRAMUSCULAR | Status: DC | PRN
  Administered 2012-05-22: 1 mg via INTRAMUSCULAR
  Filled 2012-05-21: qty 1

## 2012-05-21 MED ORDER — DARBEPOETIN ALFA-POLYSORBATE 100 MCG/0.5ML IJ SOLN
INTRAMUSCULAR | Status: AC
  Administered 2012-05-21: 100 ug via INTRAVENOUS
  Filled 2012-05-21: qty 0.5

## 2012-05-21 MED ORDER — SODIUM CHLORIDE 0.9 % IV SOLN
125.0000 mg | INTRAVENOUS | Status: DC
  Administered 2012-05-21 – 2012-06-04 (×5): 125 mg via INTRAVENOUS
  Filled 2012-05-21 (×13): qty 10

## 2012-05-21 MED ORDER — OXYCODONE HCL 5 MG PO TABS
5.0000 mg | ORAL_TABLET | ORAL | Status: DC | PRN
  Administered 2012-05-21 – 2012-06-02 (×7): 5 mg via ORAL
  Filled 2012-05-21 (×6): qty 1

## 2012-05-21 MED ORDER — INSULIN ASPART 100 UNIT/ML ~~LOC~~ SOLN: 0.0000 [IU] | Freq: Every day | SUBCUTANEOUS | Status: DC

## 2012-05-21 MED ORDER — INSULIN ASPART 100 UNIT/ML ~~LOC~~ SOLN
3.0000 [IU] | Freq: Three times a day (TID) | SUBCUTANEOUS | Status: DC
  Administered 2012-05-22: 3 [IU] via SUBCUTANEOUS

## 2012-05-21 MED ORDER — PARICALCITOL 5 MCG/ML IV SOLN
1.0000 ug | INTRAVENOUS | Status: DC
  Administered 2012-05-21 – 2012-06-04 (×6): 1 ug via INTRAVENOUS
  Filled 2012-05-21 (×6): qty 0.2

## 2012-05-21 MED ORDER — DARBEPOETIN ALFA-POLYSORBATE 100 MCG/0.5ML IJ SOLN
100.0000 ug | INTRAMUSCULAR | Status: DC
  Administered 2012-05-21 – 2012-05-28 (×2): 100 ug via INTRAVENOUS
  Filled 2012-05-21: qty 0.5

## 2012-05-21 MED ORDER — PARICALCITOL 5 MCG/ML IV SOLN
INTRAVENOUS | Status: AC
  Administered 2012-05-21: 1 ug via INTRAVENOUS
  Filled 2012-05-21: qty 1

## 2012-05-21 MED ORDER — RENA-VITE PO TABS
1.0000 | ORAL_TABLET | Freq: Every day | ORAL | Status: DC
  Administered 2012-05-22 – 2012-06-04 (×13): 1 via ORAL
  Filled 2012-05-21 (×15): qty 1

## 2012-05-21 MED ORDER — LORAZEPAM 2 MG/ML IJ SOLN
1.0000 mg | Freq: Once | INTRAMUSCULAR | Status: AC
  Administered 2012-05-21: 1 mg via INTRAVENOUS

## 2012-05-21 MED ORDER — INSULIN ASPART 100 UNIT/ML ~~LOC~~ SOLN: 0.0000 [IU] | Freq: Three times a day (TID) | SUBCUTANEOUS | Status: DC

## 2012-05-21 MED ORDER — LORAZEPAM 2 MG/ML IJ SOLN
INTRAMUSCULAR | Status: AC
  Administered 2012-05-21: 1 mg via INTRAVENOUS
  Filled 2012-05-21: qty 1

## 2012-05-21 NOTE — Progress Notes (Signed)
[facility] PROGRESS NOTE Assessment/Plan: Femur fracture, left (05/19/2012) - s/p retrograde intramedullary nailing of left femur 12/22 per Dr. Carola Frost. - PT/ot consult pending. - worked with PT rec pnding. pateint in a lot of pain. Continue robaxin and narcotics.   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  HYPERTENSION (05/03/2006) - continue to hold Bp meds.  RENAL DISEASE, CHRONIC, STAGE V (07/29/2008) - [facility]; last HD on 12/20; K 4.3 yesterday. Hd today and again tomorrow per holiday schedule. - appreciate renal assistance.  Fall at home (05/19/2012) - no history of LOC.   Code Status: full Family Communication: none  Disposition Plan: SNF in am   Consultants:  ortho  Procedures: -  Intramedullary nailing of left femur 12/22 per Dr. Carola Frost   Antibiotics: Cefazolin one dose  12.22.2013  HPI/Subjective: Complaining of pain. After working with PT.  Objective: Filed Vitals:   05/21/12 0000 05/21/12 0100 05/21/12 0400 05/21/12 0500  BP:  93/46  99/61  Pulse:  108  107  Temp:  98.5 F (36.9 C)  99.6 F (37.6 C)  TempSrc:  Oral  Oral  Resp: 20 20 20 20   Height:      Weight:      SpO2: 93% 95% 95% 93%    Intake/Output Summary (Last 24 hours) at 05/21/12 1056 Last data filed at 05/20/12 1228  Gross per 24 hour  Intake    350 ml  Output      0 ml  Net    350 ml   Filed Weights   05/19/12 1634 05/19/12 2216  Weight: 80.74 kg (178 lb) 79 kg (174 lb 2.6 oz)    Exam:  General: Alert, awake, oriented x2, in pain HEENT: No bruits, no goiter.  Heart: Regular rate and rhythm, without murmurs, rubs, gallops.  Lungs: Good air movement, bilateral air movement.  Abdomen: Soft, nontender, nondistended, positive bowel sounds.    Data Reviewed: Basic Metabolic Panel:  Lab 05/20/12 1610 05/19/12 1746 05/17/12 1910  NA 132* 135 124*  K 4.3 3.4* 4.7  CL 92* 96 83*  CO2 22 26 22   GLUCOSE 113* 48* 636*  BUN 37* 35* 54*   CREATININE 7.31* 6.97* 7.76*  CALCIUM 8.4 8.0* 8.3*  MG -- -- --  PHOS -- -- --   Liver Function Tests:  Lab 05/19/12 1746 05/17/12 1910  AST 19 25  ALT 13 28  ALKPHOS 165* 196*  BILITOT 0.3 0.2*  PROT 7.0 7.7  ALBUMIN 2.8* 3.1*    Lab 05/17/12 1910  LIPASE 80*  AMYLASE --   No results found for this basename: AMMONIA:5 in the last 168 hours CBC:  Lab 05/20/12 0520 05/19/12 1746 05/17/12 1910  WBC 22.0* 19.2* 10.7*  NEUTROABS -- 16.7* 10.1*  HGB 9.9* 11.3* 11.3*  HCT 33.0* 36.2 35.8*  MCV 83.1 83.0 82.5  PLT 114* 127* 123*   Cardiac Enzymes: No results found for this basename: CKTOTAL:5,CKMB:5,CKMBINDEX:5,TROPONINI:5 in the last 168 hours BNP (last 3 results) No results found for this basename: PROBNP:3 in the last 8760 hours CBG:  Lab 05/21/12 0746 05/20/12 2121 05/20/12 1654 05/20/12 1128 05/19/12 2227  GLUCAP 330* 255* 205* 136* 128*    Recent Results (from the past 240 hour(s))  SURGICAL PCR SCREEN     Status: Normal   Collection Time   05/20/12  1:45 AM      Component Value Range Status Comment   MRSA, PCR NEGATIVE  NEGATIVE Final  Staphylococcus aureus NEGATIVE  NEGATIVE Final      Studies: Dg Chest 1 View  05/19/2012  *RADIOLOGY REPORT*  Clinical Data: Larey Seat from bed  CHEST - 1 VIEW  Comparison: 02/27/2012  Findings: Left jugular dual-lumen central venous catheter tip projecting over cavoatrial junction. Enlargement of cardiac silhouette with pulmonary vascular congestion. Mild diffuse increased interstitial markings in both lungs question minimal edema. No segmental consolidation, pleural effusion or pneumothorax. Bones demineralized.  IMPRESSION: Enlargement of cardiac silhouette with pulmonary vascular congestion. Question minimal pulmonary edema.   Original Report Authenticated By: Ulyses Southward, M.D.    Dg Femur Left  05/20/2012  *RADIOLOGY REPORT*  Clinical Data: Post retrograde line and nail of the left femur  DG C-ARM 61-120 MIN, LEFT FEMUR - 2  VIEW  Comparison:  Left femur radiographs - 05/19/2012  Findings:  Six spot intraoperative fluoroscopic images of the left femur are provided for review.  Images demonstrate retrograde intramedullary nail fixation of previously noted distal diaphyseal/metaphyseal comminuted femur fracture. Alignment is much improved.  The distal end of the intramedullary rod is transfixed with three cancellous screws.  The proximal end of the femoral rods transfixed with two cancellous screws.  Skin staples overlying the proximal thigh. No radiopaque foreign body.  IMPRESSION: Post intramedullary rod fixation of comminuted distal diaphyseal/metaphyseal femoral fracture.   Original Report Authenticated By: Tacey Ruiz, MD    Dg Femur Left  05/19/2012  *RADIOLOGY REPORT*  Clinical Data: Fall  LEFT FEMUR - 2 VIEW  Comparison: None.  Findings: Three views of the left femur submitted.  There is displaced comminuted fracture in distal left femoral shaft.  IMPRESSION: Displaced comminuted fracture distal left femoral shaft.   Original Report Authenticated By: Natasha Mead, M.D.    Dg Tibia/fibula Left  05/19/2012  *RADIOLOGY REPORT*  Clinical Data: Fall  LEFT TIBIA AND FIBULA - 2 VIEW  Comparison: None.  Findings: Two views of the left tibia-fibula submitted.  No acute fracture or subluxation.  Atherosclerotic vascular calcifications are noted.  IMPRESSION: No acute fracture or subluxation.   Original Report Authenticated By: Natasha Mead, M.D.    Dg Femur Left Port  05/20/2012  *RADIOLOGY REPORT*  Clinical Data: Status post ORIF.  PORTABLE LEFT FEMUR - 2 VIEW  Comparison: Intraoperative fluoro spot images 05/20/2012.  Findings: The patient is status post placement of an intramedullary rod in the left femur.  Three distal and two proximal interlocking screws are in place.  There is slight anterior displacement of approximately one cortex width of the distal comminuted fracture. A moderate sized joint effusion is present.   Atherosclerotic calcifications are present within the femoral artery.  IMPRESSION:  1.  Near anatomic reduction of the comminuted distal femur fracture status post ORIF. 2.  No radiographic evidence for complication. 3.  Atherosclerosis.   Original Report Authenticated By: Marin Roberts, M.D.    Dg Knee Complete 4 Views Left  05/19/2012  *RADIOLOGY REPORT*  Clinical Data: Fall  LEFT KNEE - COMPLETE 4+ VIEW  Comparison: Left femur same day  Findings: Two views of the left knee submitted.  No knee fracture or subluxation.  Again noted displaced comminuted fracture distal left femoral shaft.  IMPRESSION: No knee fracture or subluxation.  Again noted displaced comminuted fracture distal left femoral shaft.   Original Report Authenticated By: Natasha Mead, M.D.    Dg C-arm 61-120 Min  05/20/2012  *RADIOLOGY REPORT*  Clinical Data: Post retrograde line and nail of the left femur  DG C-ARM 61-120 MIN,  LEFT FEMUR - 2 VIEW  Comparison:  Left femur radiographs - 05/19/2012  Findings:  Six spot intraoperative fluoroscopic images of the left femur are provided for review.  Images demonstrate retrograde intramedullary nail fixation of previously noted distal diaphyseal/metaphyseal comminuted femur fracture. Alignment is much improved.  The distal end of the intramedullary rod is transfixed with three cancellous screws.  The proximal end of the femoral rods transfixed with two cancellous screws.  Skin staples overlying the proximal thigh. No radiopaque foreign body.  IMPRESSION: Post intramedullary rod fixation of comminuted distal diaphyseal/metaphyseal femoral fracture.   Original Report Authenticated By: Tacey Ruiz, MD     Scheduled Meds:   . acetaminophen  1,000 mg Intravenous Q6H  . darbepoetin (ARANESP) injection - DIALYSIS  100 mcg Intravenous Q Mon-HD  . docusate sodium  100 mg Oral BID  . ferric gluconate (FERRLECIT/NULECIT) IV  125 mg Intravenous Q M,W,F-HD  . insulin aspart  0-5 Units Subcutaneous  QHS  . insulin aspart  0-9 Units Subcutaneous TID WC  . insulin aspart  3 Units Subcutaneous TID WC  . insulin glargine  10 Units Subcutaneous QHS  . levothyroxine  75 mcg Oral QAC breakfast  . multivitamin  1 tablet Oral Daily  . niacin  500 mg Oral Daily  . paricalcitol  1 mcg Intravenous 3 times weekly  . polyethylene glycol  17 g Oral Daily  . sodium chloride  3 mL Intravenous Q12H  . sodium chloride  3 mL Intravenous Q12H   Continuous Infusions:    Marinda Elk  [facility] Pager [phone]. If 8PM-8AM, please contact night-coverage at [email or web address], password [facility] 05/21/2012, 10:56 AM  LOS: 2 days

## 2012-05-21 NOTE — Op Note (Signed)
[name], [name]               ACCOUNT NO.:  [id number]  MEDICAL RECORD NO.:  [id number]  LOCATION:                                 FACILITY:  PHYSICIAN:  Doralee Albino. Carola Frost, M.D. DATE OF BIRTH:  November 01, 1945  DATE OF PROCEDURE:  05/20/2012 DATE OF DISCHARGE:                              OPERATIVE REPORT   PREOPERATIVE DIAGNOSIS:  Left supracondylar femur fracture.  POSTOPERATIVE DIAGNOSIS:  Left supracondylar femur fracture.  PROCEDURE:  Retrograde intramedullary nailing of the left femur using a [employer] 12.5 x 380 mm statically locked nail.  SURGEON:  Doralee Albino. Carola Frost, M.D.  ASSISTANT:  None.  ANESTHESIA:  General.  COMPLICATIONS:  None.  ESTIMATED BLOOD LOSS:  Minimal of reamings.  DISPOSITION:  PACU.  CONDITION:  Stable.  BRIEF SUMMARY OF INDICATION FOR PROCEDURE:  [name] is a very pleasant 66 year old female who sustained a ground level fall resulting in a displaced comminuted metaphyseal fracture of the left femur.  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 We did discuss with her preoperative risks and benefits of surgical repair including the possibility of further blood loss could lead to heart attack, stroke, or other complications.  She understood that as well as the additional risks that were discussed including DVT, PE, malunion, nonunion, loss of fixation given her anticipated osteoporosis and many others and again she did wish to proceed.  BRIEF SUMMARY OF PROCEDURE:  [name] was given preoperative antibiotics, taken to the operating room where general anesthesia was induced.  Her left lower extremity was carefully prepped and draped in the usual sterile fashion.  No tourniquet was used during the procedure. Radiolucent triangle was positioned under the left femur and bumps used to achieve reduction.  The canal was sequentially reamed and then a 12.5 x 380 mm nail placed.  The 36 was felt to be too short  and was concerned this could lead to subtrochanteric stress and the possibility of subsequent fracture there and a 380 trended toward being slightly longer such that should she have a femoral neck fracture that may require removal.  In the end, we felt that the highest priority was repaired now without creating a subtroch and stress riser consequently I selected the 380 mm nail.  The reduction maneuver to prevent excessive valgus was performed during placement of the distal locking bolts and then perfect circle technique use of the two proximal bolts.  All wounds were irrigated quite thoroughly including the knee joint at the conclusion of procedure and then standard layered closure with 0 Vicryl, 2-0 Vicryl, and 3-0 nylon performed.  The patient was then awakened anesthesia and transported to PACU in stable condition.  She did have A-line monitoring during the procedure and did not receive any blood products nor was there any significant blood loss incurred.  I did minimally reamed the canal to further reduce loss during the procedure.  PROGNOSIS:  [name] will be nonweightbearing on the left lower extremity for the next 4-6 weeks with graduated weightbearing thereafter as she begins to develop healing clinically and radiographically.  She will have unrestricted range of motion of the hip and knee.  [name] certainly increased risk and complications given her significant medical comorbidities and renal osteodystrophy.     Doralee Albino. Carola Frost, M.D.     MHH/MEDQ  D:  05/20/2012  T:  05/20/2012  Job:  [id number]

## 2012-05-21 NOTE — Clinical Social Work Placement (Addendum)
    Clinical Social Work Department CLINICAL SOCIAL WORK PLACEMENT NOTE 05/21/2012  Patient:  YESSENIA, MAILLET  Account Number:  [id number] Admit date:  05/19/2012  Clinical Social Worker:  Margaree Mackintosh  Date/time:  05/21/2012 12:00 M  Clinical Social Work is seeking post-discharge placement for this patient at the following level of care:   SKILLED NURSING   (*CSW will update this form in Epic as items are completed)   05/21/2012  Patient/family provided with [facility] Department of Clinical Social Work's list of facilities offering this level of care within the geographic area requested by the patient (or if unable, by the patient's family).  05/21/2012  Patient/family informed of their freedom to choose among providers that offer the needed level of care, that participate in Medicare, Medicaid or managed care program needed by the patient, have an available bed and are willing to accept the patient.  05/21/2012  Patient/family informed of [facility]' ownership interest in [facility], as well as of the fact that they are under no obligation to receive care at this facility.  PASARR submitted to EDS on 05/21/2012 PASARR number received from [employer] on 05/21/2012  FL2 transmitted to all facilities in geographic area requested by pt/family on  05/21/2012 FL2 transmitted to all facilities within larger geographic area on   Patient informed that his/her managed care company has contracts with or will negotiate with  certain facilities, including the following:     Patient/family informed of bed offers received:  06/02/2011 Patient chooses bed at [facility] Physician recommends and patient chooses bed at    Patient to be transferred to  on   Patient to be transferred to facility by [facility]  The following physician request were entered in Epic:   Additional Comments:

## 2012-05-21 NOTE — Consult Note (Signed)
[facility] Renal Consultation Note  Indication for Consultation:  Management of ESRD/hemodialysis; anemia, hypertension/volume and secondary hyperparathyroidism  HPI: [name] is a 66 y.o. female with ESRD on dialysis on MWF in [place] who presented to [facility] ER on 12/21 for pain and swelling at the left upper leg and knee associated with a fall and was transferred to [facility], where x-ray showed displaced comminuted fracture of the distal left femoral shaft.  Yesterday she had surgical repair with intramedullary retrograde femoral nailing by Dr. Myrene Galas.  She currently has no complaints of pain, but does not recall what happened and was unaware that she had surgery.   Dialysis Orders: Center: [facility] on MWF. EDW 76.5 kg   HD Bath 2K/2.25Ca  Time 4 hrs  Heparin 7600-U bolus, 2000-U mid HD. Access Left IJ catheter BFR 400  DFR 800    Zemplar 1 mcg IV/HD   Epogen 0 Units IV/HD  Venofer 100 mg per HD.  Past Medical History  Diagnosis Date  . Dialysis care     M-W-F dialysis at [facility]  . COPD (chronic obstructive pulmonary disease)   . Diabetes mellitus   . Hypertension   . Thyroid disease   . Hyperlipidemia   . Bronchitis   . Leg pain   . Renal disorder    Past Surgical History  Procedure Date  . Cholecystectomy   . Dg av dialysis shunt access exist*r* or     working right HD catheter  . Av fistula placement 05/18/2010   No family history on file.  Social History She reports that she has been smoking Cigarettes.  She has a 15 pack-year smoking history. She reports that she does not drink alcohol or use illicit drugs.    Allergies  Allergen Reactions  . Contrast Media (Iodinated Diagnostic Agents) Itching   Prior to Admission medications   Medication Sig Start Date End Date Taking? Authorizing Provider  bisacodyl (DULCOLAX) 5 MG EC tablet Take 1 tablet (5 mg total) by mouth 2 (two) times daily. 05/17/12  Yes  John-Adam Bonk, MD  doxazosin (CARDURA) 8 MG tablet Take 8 mg by mouth at bedtime.     Yes Historical Provider, MD  fluticasone (FLONASE) 50 MCG/ACT nasal spray Place 2 sprays into the nose Daily. 05/02/12  Yes Historical Provider, MD  glipiZIDE (GLUCOTROL) 10 MG tablet Take 10 mg by mouth 2 (two) times daily before a meal.     Yes Historical Provider, MD  insulin lispro protamine-insulin lispro (HUMALOG 75/25) (75-25) 100 UNIT/ML SUSP Inject 15-35 Units into the skin. Patient takes 35 units every morning and 15 units in the evening    Yes Historical Provider, MD  levothyroxine (SYNTHROID, LEVOTHROID) 75 MCG tablet Take 75 mcg by mouth daily.     Yes Historical Provider, MD  lisinopril (PRINIVIL,ZESTRIL) 20 MG tablet Take 20 mg by mouth every evening.    Yes Historical Provider, MD  multivitamin (RENA-VIT) TABS tablet Take 1 tablet by mouth daily.   Yes Historical Provider, MD  niacin (NIASPAN) 500 MG CR tablet Take 500 mg by mouth daily.    Yes Historical Provider, MD   Labs:  Results for orders placed during the hospital encounter of 05/19/12 (from the past 48 hour(s))  CBC WITH DIFFERENTIAL     Status: Abnormal   Collection Time   05/19/12  5:46 PM      Component Value Range Comment   WBC 19.2 (*) 4.0 - 10.5 K/uL  RBC 4.36  3.87 - 5.11 MIL/uL    Hemoglobin 11.3 (*) 12.0 - 15.0 g/dL    HCT 16.1  09.6 - 04.5 %    MCV 83.0  78.0 - 100.0 fL    MCH 25.9 (*) 26.0 - 34.0 pg    MCHC 31.2  30.0 - 36.0 g/dL    RDW 40.9 (*) 81.1 - 15.5 %    Platelets 127 (*) 150 - 400 K/uL    Neutrophils Relative 87 (*) 43 - 77 %    Lymphocytes Relative 7 (*) 12 - 46 %    Monocytes Relative 6  3 - 12 %    Eosinophils Relative 0  0 - 5 %    Basophils Relative 0  0 - 1 %    Neutro Abs 16.7 (*) 1.7 - 7.7 K/uL    Lymphs Abs 1.3  0.7 - 4.0 K/uL    Monocytes Absolute 1.2 (*) 0.1 - 1.0 K/uL    Eosinophils Absolute 0.0  0.0 - 0.7 K/uL    Basophils Absolute 0.0  0.0 - 0.1 K/uL    RBC Morphology TARGET CELLS    SPHEROCYTES   WBC Morphology TOXIC GRANULATION     COMPREHENSIVE METABOLIC PANEL     Status: Abnormal   Collection Time   05/19/12  5:46 PM      Component Value Range Comment   Sodium 135  135 - 145 mEq/L DELTA CHECK NOTED   Potassium 3.4 (*) 3.5 - 5.1 mEq/L DELTA CHECK NOTED   Chloride 96  96 - 112 mEq/L DELTA CHECK NOTED   CO2 26  19 - 32 mEq/L    Glucose, Bld 48 (*) 70 - 99 mg/dL    BUN 35 (*) 6 - 23 mg/dL DELTA CHECK NOTED   Creatinine, Ser 6.97 (*) 0.50 - 1.10 mg/dL    Calcium 8.0 (*) 8.4 - 10.5 mg/dL    Total Protein 7.0  6.0 - 8.3 g/dL    Albumin 2.8 (*) 3.5 - 5.2 g/dL    AST 19  0 - 37 U/L    ALT 13  0 - 35 U/L    Alkaline Phosphatase 165 (*) 39 - 117 U/L    Total Bilirubin 0.3  0.3 - 1.2 mg/dL    GFR calc non Af Amer 5 (*) >90 mL/min    GFR calc Af Amer 6 (*) >90 mL/min   PROTIME-INR     Status: Normal   Collection Time   05/19/12  5:46 PM      Component Value Range Comment   Prothrombin Time 13.7  11.6 - 15.2 seconds    INR 1.06  0.00 - 1.49   GLUCOSE, CAPILLARY     Status: Abnormal   Collection Time   05/19/12  7:14 PM      Component Value Range Comment   Glucose-Capillary 44 (*) 70 - 99 mg/dL   GLUCOSE, CAPILLARY     Status: Normal   Collection Time   05/19/12  7:29 PM      Component Value Range Comment   Glucose-Capillary 99  70 - 99 mg/dL   GLUCOSE, CAPILLARY     Status: Abnormal   Collection Time   05/19/12  8:33 PM      Component Value Range Comment   Glucose-Capillary 137 (*) 70 - 99 mg/dL   GLUCOSE, CAPILLARY     Status: Abnormal   Collection Time   05/19/12 10:27 PM      Component Value Range Comment  Glucose-Capillary 128 (*) 70 - 99 mg/dL   NO BLOOD PRODUCTS     Status: Normal   Collection Time   05/20/12 12:26 AM      Component Value Range Comment   Transfuse no blood products        Value: TRANSFUSE NO BLOOD PRODUCTS, VERIFIED BY KAREN YOUNG RN  PROTIME-INR     Status: Normal   Collection Time   05/20/12 12:28 AM      Component Value  Range Comment   Prothrombin Time 13.5  11.6 - 15.2 seconds    INR 1.04  0.00 - 1.49   BASIC METABOLIC PANEL     Status: Abnormal   Collection Time   05/20/12 12:29 AM      Component Value Range Comment   Sodium 132 (*) 135 - 145 mEq/L    Potassium 4.3  3.5 - 5.1 mEq/L    Chloride 92 (*) 96 - 112 mEq/L    CO2 22  19 - 32 mEq/L    Glucose, Bld 113 (*) 70 - 99 mg/dL    BUN 37 (*) 6 - 23 mg/dL    Creatinine, Ser 1.19 (*) 0.50 - 1.10 mg/dL    Calcium 8.4  8.4 - 14.7 mg/dL    GFR calc non Af Amer 5 (*) >90 mL/min    GFR calc Af Amer 6 (*) >90 mL/min   SURGICAL PCR SCREEN     Status: Normal   Collection Time   05/20/12  1:45 AM      Component Value Range Comment   MRSA, PCR NEGATIVE  NEGATIVE    Staphylococcus aureus NEGATIVE  NEGATIVE   CBC     Status: Abnormal   Collection Time   05/20/12  5:20 AM      Component Value Range Comment   WBC 22.0 (*) 4.0 - 10.5 K/uL    RBC 3.97  3.87 - 5.11 MIL/uL    Hemoglobin 9.9 (*) 12.0 - 15.0 g/dL    HCT 82.9 (*) 56.2 - 46.0 %    MCV 83.1  78.0 - 100.0 fL    MCH 24.9 (*) 26.0 - 34.0 pg    MCHC 30.0  30.0 - 36.0 g/dL    RDW 13.0 (*) 86.5 - 15.5 %    Platelets 114 (*) 150 - 400 K/uL PLATELET COUNT CONFIRMED BY SMEAR  GLUCOSE, CAPILLARY     Status: Abnormal   Collection Time   05/20/12 11:28 AM      Component Value Range Comment   Glucose-Capillary 136 (*) 70 - 99 mg/dL   GLUCOSE, CAPILLARY     Status: Abnormal   Collection Time   05/20/12  4:54 PM      Component Value Range Comment   Glucose-Capillary 205 (*) 70 - 99 mg/dL   GLUCOSE, CAPILLARY     Status: Abnormal   Collection Time   05/20/12  9:21 PM      Component Value Range Comment   Glucose-Capillary 255 (*) 70 - 99 mg/dL   GLUCOSE, CAPILLARY     Status: Abnormal   Collection Time   05/21/12  7:46 AM      Component Value Range Comment   Glucose-Capillary 330 (*) 70 - 99 mg/dL    Constitutional: negative for chills, fatigue, fevers and sweats Ears, nose, mouth, throat, and  face: negative for hearing loss, hoarseness, nasal congestion and sore throat Respiratory: negative for cough, dyspnea on exertion, hemoptysis and sputum Cardiovascular: negative for chest pain, dyspnea, orthopnea  and palpitations Gastrointestinal: negative for abdominal pain, change in bowel habits, nausea and vomiting Genitourinary:negative, oliguric Musculoskeletal:negative for arthralgias, back pain and neck pain, currently denies any left leg pain. Neurological: negative for dizziness, headaches, seizures and tremors  Physical Exam: Filed Vitals:   05/21/12 0500  BP: 99/61  Pulse: 107  Temp: 99.6 F (37.6 C)  Resp: 20     General appearance: alert, cooperative, no distress and oriented only to person Head: Normocephalic, without obvious abnormality, atraumatic Neck: no adenopathy, no carotid bruit, no JVD and supple, symmetrical, trachea midline Resp: clear to auscultation bilaterally Cardio: regular rate and rhythm, S1, S2 normal, no murmur, click, rub or gallop GI: soft, non-tender; bowel sounds normal; no masses,  no organomegaly Extremities: no edema on right, entire left leg wrapped s/p surgery yesterday Neurologic: Grossly normal Dialysis Access: left IJ catheter   Assessment/Plan: 1. Left supracondylar femur fracture - s/p retrograde intramedullary nailing of left femur 12/22 per Dr. Carola Frost; pain controlled on meds. 2. ESRD -  HD on MWF @ [facility]; last HD on 12/20; K 4.3 yesterday.  Hd today and again tomorrow per holiday schedule. 3. Hypertension/volume  - BP 99/61 most recently on outpatient Amlodipine 5 mg qd, Dozazosin 8 mg qhs, and Lisinopril 20 mg qd; wt 79 kg on 12/21 with EDW 76.5 (post-HD wt 76.9 on 12/20).  No meds here, UF goal of 3 L today. 4. Anemia  - Hgb 9.9 pre-surgery yesterday; no outpatient Epogen, but Venofer qHD; pt is [employer].  Check CBC, Aranesp 100 mg today. 5. Metabolic bone disease -  Ca 8.4 (9.4 corrected), last P 4.1 on 11/20, last iPTH 190  on 11/20; on Zemplar 1 mcg, on Tums Ultra 1 with meals. 6. Nutrition - Alb 2.8, high protein renal diet. 7. DM Type 2 - on insulin. 8. Hypothyroidsim - on Synthroid.  LYLES,CHARLES 05/21/2012, 9:38 AM   Attending Nephrologist:  Primitivo Gauze, MD I have seen and examined this patient and agree with plan per Gerome Apley.  SP repair of lt hip fx.  [name] moaning and "I don't know why"  Husband says she has been like that since the fracture.  Will plan HD today and then again on thur and sat and get back to MWF schedule next week.  Hold BP meds for now.  Resume renal vitamin and EPO.Jamyria Ozanich T,MD 05/21/2012 10:42 AM

## 2012-05-21 NOTE — Clinical Social Work Psychosocial (Signed)
     Clinical Social Work Department BRIEF PSYCHOSOCIAL ASSESSMENT 05/21/2012  Patient:  [name], [name]     Account Number:  [id number]     Admit date:  05/19/2012  Clinical Social Worker:  Margaree Mackintosh  Date/Time:  05/21/2012 12:00 M  Referred by:  Physician  Date Referred:  05/21/2012 Referred for  SNF Placement   Other Referral:   Interview type:  Family Other interview type:   Pt currently unable to fully participate due to medical condition.    PSYCHOSOCIAL DATA Living Status:  FAMILY Admitted from facility:   Level of care:   Primary support name:  [name]: [phone] Primary support relationship to patient:  SPOUSE Degree of support available:   Adequate.    CURRENT CONCERNS Current Concerns  Post-Acute Placement   Other Concerns:    SOCIAL WORK ASSESSMENT / PLAN Clinical Social Worker recieved referral for potential SNF at dc.  CSW reviewed chart and met with pt and spouse at bedside.  CSW introduced self, explained role, and provided support.  CSW provided active listening.  CSW reviewed potential need for SNF at dc; spouse agreeable to facility search in [place] area.  CSW to submit clinicals for SNF search.  CSW to continue to follow and assist as needed.   Assessment/plan status:  Information/Referral to [facility] Other assessment/ plan:   Information/referral to community resources:   SNF.    PATIENTS/FAMILYS RESPONSE TO PLAN OF CARE: Pt currently unable to fully particpiate in assessment due to medical condition. Spouse was pleasant and engaged in conversation.  Spouse thanked CSW fo rinterveniton.

## 2012-05-21 NOTE — Progress Notes (Signed)
IV team unable to get peripheral access. Notified [facility] on-call who asked to consult with Nephrology prior to changing meds from IV to PO. Both MDs made aware.  Harless Litten, RN 05/21/12

## 2012-05-21 NOTE — Procedures (Signed)
Pt seen on HD. Ap 150  Vp 130  No problems

## 2012-05-21 NOTE — Progress Notes (Signed)
[facility] (OTS)  Subjective: 1 Day Post-Op Procedure(s) (LRB): INTRAMEDULLARY (IM) RETROGRADE FEMORAL NAILING (Left)  Pt doing ok Quite somnolent Easily aroused  Objective: Current Vitals Blood pressure 99/61, pulse 107, temperature 99.6 F (37.6 C), temperature source Oral, resp. rate 20, height 5\' 3"  (1.6 m), weight 79 kg (174 lb 2.6 oz), SpO2 93.00%. Vital signs in last 24 hours: Temp:  [98 F (36.7 C)-99.6 F (37.6 C)] 99.6 F (37.6 C) (12/23 0500) Pulse Rate:  [94-109] 107  (12/23 0500) Resp:  [16-24] 20  (12/23 0500) BP: (93-156)/(46-106) 99/61 mmHg (12/23 0500) SpO2:  [92 %-100 %] 93 % (12/23 0500)  Intake/Output from previous day: 12/22 0701 - 12/23 0700 In: 650 [I.V.:550; IV Piggyback:100] Out: 25 [Blood:25] Intake/Output      12/22 0701 - 12/23 0700 12/23 0701 - 12/24 0700   I.V. (mL/kg) 550 (7)    IV Piggyback 100    Total Intake(mL/kg) 650 (8.2)    Blood 25    Total Output 25    Net +625            LABS  Basename 05/20/12 0520 05/19/12 1746  HGB 9.9* 11.3*    Basename 05/20/12 0520 05/19/12 1746  WBC 22.0* 19.2*  RBC 3.97 4.36  HCT 33.0* 36.2  PLT 114* 127*    Basename 05/20/12 0029 05/19/12 1746  NA 132* 135  K 4.3 3.4*  CL 92* 96  CO2 22 26  BUN 37* 35*  CREATININE 7.31* 6.97*  GLUCOSE 113* 48*  CALCIUM 8.4 8.0*    Basename 05/20/12 0028 05/19/12 1746  LABPT -- --  INR 1.04 1.06     Physical Exam  Gen: somnolent but arousable Lungs: unlabored Cardiac:s1 and s2 Abd: + BS Ext:      Left lower extremity   Dressing c/d/i  Distal motor and sensory functions intact  Ext warm  Swelling stable   Assessment/Plan: 1 Day Post-Op Procedure(s) (LRB): INTRAMEDULLARY (IM) RETROGRADE FEMORAL NAILING (Left)  66 y/o female s/p ground level fall  1. Fall 2. L supracondylar distal femur fracture POD 1  NWB x 6-8 weeks  Unrestricted ROM L knee  PT/OT evals  No pillows under knee at rest, can place under  calf  Ice and elevate 3. Metabolic bone disease workup  Labs pending  Poor bone quality likely attributable to ESRD 4. Pain  Minimize narcs, pt somnolent today  Tylenol 5. DVT/PE prophylaxis  Defer to medicine service re: pharmacologics as pt with very low CrCl and recently seen at hospital for rectal bleed  SCD's 6. FEN  Advance diet as tolerated 7. ESRD  ? Is somnolence related to this, pt normally goes to dialysis on mondays  Pt will need dialysis  Renal panel pending 8. ABL anemia  Recheck CBC this am 9. Dispo  Therapies  Will likely need snf     Mearl Latin, PA-C [facility] [phone] (P) 05/21/2012, 10:02 AM

## 2012-05-21 NOTE — Evaluation (Signed)
Physical Therapy Evaluation Patient Details Name: [name] MRN: [id number] DOB: [birth year] Today's Date: 05/21/2012 Time: 4098-1191 PT Time Calculation (min): 43 min  PT Assessment / Plan / Recommendation Clinical Impression  66 y.o. female s/p fall who had L femur IM Nail and is now NWB.  [name] POD #1 and very confused and lethargic.  Not sure of home situation or family suppport due to pt unable to give history.  She will likely need SNF placement due to NWB status of left leg.      PT Assessment  Patient needs continued PT services    Follow Up Recommendations  SNF    Does the patient have the potential to tolerate intense rehabilitation    NA  Barriers to Discharge Decreased caregiver support unsure of family support    Equipment Recommendations  Rolling walker with 5" wheels;Wheelchair cushion (measurements PT);Wheelchair (measurements PT) (18x18 with left elevating leg rest, basic cushion)    Recommendations for Other Services   none  Frequency Min 3X/week    Precautions / Restrictions Precautions Precautions: Fall Restrictions LLE Weight Bearing: Non weight bearing   Pertinent Vitals/Pain Faces pain scale 10/10, RN pre medicated with IV pain meds at beginning of session      Mobility  Bed Mobility Bed Mobility: Supine to Sit;Sitting - Scoot to Edge of Bed Supine to Sit: 2: Max assist;With rails;HOB elevated Sitting - Scoot to [place] of Bed: 2: Max assist Sit to Supine: 1: +2 Total assist;With rail Sit to Supine: Patient Percentage: 10% Details for Bed Mobility Assistance: max assist to get pt to sitting using bed for help and manual assist for hand placment.  Therapist moving bil legs, hips to EOB and then supporting trunk.  Pt pulling on bedrail to help.  To return to supine pt threw herself straight backwards onto the bed as PT was helping with legs bil, had to call RN to get pt positioned in the bed straight.  Pt screaming in pain despite premedicated by RN  with IV pain meds at beginning of session.   Transfers Transfers: Not assessed (Due to cognition and pt headed to HD this AM) Ambulation/Gait Ambulation/Gait Assistance: Not tested (comment) (unable at this time)       Exercises Total Joint Exercises Ankle Circles/Pumps: PROM;Both;10 reps;Seated Long Arc Quad: PROM;Both;10 reps;Seated   PT Diagnosis: Difficulty walking;Abnormality of gait;Generalized weakness;Acute pain;Altered mental status  PT Problem List: Decreased strength;Decreased range of motion;Decreased activity tolerance;Decreased mobility;Decreased balance;Decreased cognition;Decreased knowledge of use of DME;Decreased safety awareness;Decreased knowledge of precautions;Pain;Obesity PT Treatment Interventions: DME instruction;Gait training;Functional mobility training;Therapeutic activities;Therapeutic exercise;Neuromuscular re-education;Balance training;Cognitive remediation;Patient/family education;Wheelchair mobility training;Modalities   PT Goals Acute Rehab PT Goals PT Goal Formulation: Patient unable to participate in goal setting Time For Goal Achievement: 06/04/12 Potential to Achieve Goals: Good Pt will go Supine/Side to Sit: with min assist PT Goal: Supine/Side to Sit - Progress: Goal set today Pt will go Sit to Supine/Side: with min assist PT Goal: Sit to Supine/Side - Progress: Goal set today Pt will go Sit to Stand: with mod assist PT Goal: Sit to Stand - Progress: Goal set today Pt will go Stand to Sit: with mod assist PT Goal: Stand to Sit - Progress: Goal set today Pt will Transfer Bed to Chair/Chair to Bed: with mod assist PT Transfer Goal: Bed to Chair/Chair to Bed - Progress: Goal set today  Visit Information  Last PT Received On: 05/21/12 Assistance Needed: +2    Subjective Data  Subjective: Pt lethargic,  confused.     Prior Functioning  Home Living Lives With: Alone Type of Home: Apartment Home Access: Level entry Home Layout: One  level Home Adaptive Equipment: Walker - rolling Additional Comments: Difficult to assess due to confusion.  HD normally M-W-Fri Prior Function Driving: No    Cognition  Overall Cognitive Status: Impaired Area of Impairment: Memory;Awareness of deficits;Attention Arousal/Alertness: Lethargic Orientation Level: Disoriented X4 Behavior During Session: Lethargic Current Attention Level: Sustained;Focused Attention - Other Comments: very little sustained Memory Deficits: decreased memory of events surrounding admission and since admission Cognition - Other Comments: did not realize she broke her leg.  Did not know she had surgery    Extremity/Trunk Assessment Right Lower Extremity Assessment RLE ROM/Strength/Tone: Deficits RLE ROM/Strength/Tone Deficits: grossly 3-/5, difficult to assess due to cognition/lethargy Left Lower Extremity Assessment LLE ROM/Strength/Tone: Deficits;Due to pain;Due to impaired cognition LLE ROM/Strength/Tone Deficits: ankle 2/5, knee1/5, hip 1/5 LLE Sensation: Deficits LLE Sensation Deficits: unable to feel toes per pt   Balance Static Sitting Balance Static Sitting - Balance Support: Right upper extremity supported;Left upper extremity supported;Feet supported Static Sitting - Level of Assistance: 5: Stand by assistance Static Sitting - Comment/# of Minutes: supervision x 10 mins while attempting functional task of eating breakfast.  Pt unable to feed herself? why due to the fact that she lifted one or the other hand at some point during sitting EOB.  When prompted to take the fork and feed herself she would say "yeah" and then stare into space.    End of Session PT - End of Session Activity Tolerance: Patient limited by pain Patient left: in bed;with call bell/phone within reach Nurse Communication: Weight bearing status    Annelie Boak B. Eldwin Volkov, PT, DPT [phone]   05/21/2012, 9:54 AM

## 2012-05-22 DIAGNOSIS — R509 Fever, unspecified: Secondary | ICD-10-CM | POA: Diagnosis present

## 2012-05-22 DIAGNOSIS — R609 Edema, unspecified: Secondary | ICD-10-CM

## 2012-05-22 DIAGNOSIS — N189 Chronic kidney disease, unspecified: Secondary | ICD-10-CM

## 2012-05-22 LAB — CBC
MCH: 25.1 pg — ABNORMAL LOW (ref 26.0–34.0)
MCV: 82.1 fL (ref 78.0–100.0)
Platelets: 133 10*3/uL — ABNORMAL LOW (ref 150–400)
RDW: 17.4 % — ABNORMAL HIGH (ref 11.5–15.5)

## 2012-05-22 LAB — GLUCOSE, CAPILLARY: Glucose-Capillary: 194 mg/dL — ABNORMAL HIGH (ref 70–99)

## 2012-05-22 LAB — BASIC METABOLIC PANEL
CO2: 23 mEq/L (ref 19–32)
Calcium: 8.3 mg/dL — ABNORMAL LOW (ref 8.4–10.5)
Creatinine, Ser: 5.17 mg/dL — ABNORMAL HIGH (ref 0.50–1.10)

## 2012-05-22 MED ORDER — HEPARIN SODIUM (PORCINE) 1000 UNIT/ML IJ SOLN
2000.0000 [IU] | Freq: Once | INTRAMUSCULAR | Status: AC
  Administered 2012-05-22: 2000 [IU] via INTRAVENOUS

## 2012-05-22 MED ORDER — SORBITOL 70 % SOLN
30.0000 mL | Freq: Two times a day (BID) | Status: DC | PRN
  Filled 2012-05-22: qty 30

## 2012-05-22 MED ORDER — VANCOMYCIN HCL 1000 MG IV SOLR
750.0000 mg | INTRAVENOUS | Status: DC
  Administered 2012-05-24: 750 mg via INTRAVENOUS
  Filled 2012-05-22 (×3): qty 750

## 2012-05-22 MED ORDER — HEPARIN SODIUM (PORCINE) 5000 UNIT/ML IJ SOLN: 5000.0000 [IU] | Freq: Three times a day (TID) | INTRAMUSCULAR | Status: DC

## 2012-05-22 MED ORDER — VANCOMYCIN HCL 10 G IV SOLR
1500.0000 mg | Freq: Once | INTRAVENOUS | Status: AC
  Administered 2012-05-22: 1500 mg via INTRAVENOUS
  Filled 2012-05-22: qty 1500

## 2012-05-22 MED ORDER — ACETAMINOPHEN 325 MG PO TABS
650.0000 mg | ORAL_TABLET | Freq: Four times a day (QID) | ORAL | Status: DC | PRN
  Administered 2012-05-22 – 2012-05-23 (×2): 650 mg via ORAL
  Filled 2012-05-22 (×3): qty 2

## 2012-05-22 NOTE — Evaluation (Signed)
Occupational Therapy Evaluation Patient Details Name: [name] MRN: [id number] DOB: 1945-08-11 Today's Date: 05/22/2012 Time: 4010-2725 OT Time Calculation (min): 33 min  OT Assessment / Plan / Recommendation Clinical Impression  Pt is a 66 yr old female admitted after fall resulting in left hip fracture.  Pt underwent ORIF via Dr. Carola Frost.  Pt now with sever limitations in selfcare independence and increased pain.  Will need acute OT services to help increase overall independence but will also need follw-up SNF for further therapy secondary to not having  24 hour assist.    OT Assessment  Patient needs continued OT Services    Follow Up Recommendations  SNF    Barriers to Discharge Decreased caregiver support    Equipment Recommendations  None recommended by OT       Frequency  Min 2X/week    Precautions / Restrictions Precautions Precautions: Fall Restrictions Weight Bearing Restrictions: Yes LLE Weight Bearing: Non weight bearing   Pertinent Vitals/Pain O2 sats 91-95% on 2Ls   Hr 115 BPM    ADL  Eating/Feeding: Simulated;Set up Where Assessed - Eating/Feeding: Chair Grooming: Simulated;Set up Where Assessed - Grooming: Supported sitting Upper Body Bathing: Simulated;Set up Where Assessed - Upper Body Bathing: Unsupported sitting Lower Body Bathing: Simulated;+2 Total assistance Lower Body Bathing: Patient Percentage: 10% Where Assessed - Lower Body Bathing: Supported sit to stand Upper Body Dressing: Simulated;Minimal assistance Where Assessed - Upper Body Dressing: Supported standing Lower Body Dressing: Simulated;+2 Total assistance Lower Body Dressing: Patient Percentage: 10% Where Assessed - Lower Body Dressing: Unsupported sit to stand Toilet Transfer: Simulated;+2 Total assistance Toilet Transfer: Patient Percentage: 10% Toilet Transfer Method: Stand pivot Acupuncturist: Other (comment) (to bedside toilet) Toileting - Clothing Manipulation  and Hygiene: Simulated;+2 Total assistance Toileting - Clothing Manipulation and Hygiene: Patient Percentage: 10% Where Assessed - Glass blower/designer Manipulation and Hygiene: Other (comment) (sit to stand from EOB) Tub/Shower Transfer Method: Not assessed Transfers/Ambulation Related to ADLs: Pt needs total assist +2 (pt 10%) for pivot transfer to the bedside chair. ADL Comments: Pt with limited participation secondary to lethargy.  Pt needs total +2 for all selfcare tasks.  Unable to follow any weightbearing precautions at this time.  Will need extensive rehab at SNF level.    OT Diagnosis: Generalized weakness;Acute pain;Altered mental status;Cognitive deficits  OT Problem List: Decreased strength;Decreased activity tolerance;Impaired balance (sitting and/or standing);Decreased knowledge of use of DME or AE;Decreased knowledge of precautions;Pain;Decreased safety awareness;Decreased cognition OT Treatment Interventions: Self-care/ADL training;Therapeutic activities;DME and/or AE instruction;Patient/family education;Balance training   OT Goals Acute Rehab OT Goals OT Goal Formulation: With patient/family Time For Goal Achievement: 06/05/12 Potential to Achieve Goals: Fair ADL Goals Pt Will Perform Lower Body Bathing: with mod assist;Sit to stand from bed;with adaptive equipment ADL Goal: Lower Body Bathing - Progress: Goal set today Pt Will Perform Lower Body Dressing: with mod assist;Supported;with adaptive equipment;Sit to stand from bed ADL Goal: Lower Body Dressing - Progress: Goal set today Pt Will Transfer to Toilet: with mod assist;Maintaining weight bearing status;with DME;3-in-1 ADL Goal: Toilet Transfer - Progress: Goal set today Pt Will Perform Toileting - Clothing Manipulation: with mod assist;Sitting on 3-in-1 or toilet;Standing ADL Goal: Toileting - Clothing Manipulation - Progress: Goal set today Pt Will Perform Toileting - Hygiene: with mod assist;Sit to stand from  3-in-1/toilet ADL Goal: Toileting - Hygiene - Progress: Goal set today Miscellaneous OT Goals Miscellaneous OT Goal #1: Pt will transfer from supine to sit EOB with mod assist in preparation for selfcare  tasks. OT Goal: Miscellaneous Goal #1 - Progress: Goal set today  Visit Information  Last OT Received On: 05/22/12 Assistance Needed: +2    Subjective Data  Subjective: I'm at [facility] Patient Stated Goal: Not stated during session.   Prior Functioning     Home Living Lives With: Alone Type of Home: Apartment Home Access: Level entry Home Layout: One level Bathroom Shower/Tub: Engineer, manufacturing systems: Standard Bathroom Accessibility: Yes Home Adaptive Equipment: Walker - rolling Additional Comments: Difficult to assess due to confusion.  HD normally M-W-Fri Prior Function Level of Independence: Independent Driving: No Vocation: On disability Communication Communication: No difficulties Dominant Hand: Right         Vision/Perception Vision - Assessment Vision Assessment: Vision not tested Perception Perception: Within Functional Limits Praxis Praxis: Intact   Cognition  Overall Cognitive Status: Impaired Area of Impairment: Attention;Memory;Safety/judgement;Following commands;Problem solving Orientation Level: Disoriented to;Place;Time Behavior During Session: Lethargic Current Attention Level: Other (comment) (decreased focused attention) Following Commands: Follows one step commands inconsistently Safety/Judgement: Decreased awareness of safety precautions    Extremity/Trunk Assessment Right Upper Extremity Assessment RUE ROM/Strength/Tone: WFL for tasks assessed;Unable to fully assess (Not formally assessed) RUE Sensation: WFL - Light Touch RUE Coordination: WFL - gross/fine motor Left Upper Extremity Assessment LUE ROM/Strength/Tone: WFL for tasks assessed;Unable to fully assess (Not formally assessed) LUE Sensation: WFL - Light Touch LUE  Coordination: WFL - gross/fine motor Trunk Assessment Trunk Assessment: Normal     Mobility Bed Mobility Bed Mobility: Supine to Sit Supine to Sit: 1: +2 Total assist;HOB flat Supine to Sit: Patient Percentage: 10% Details for Bed Mobility Assistance: Pt needed total assist with all aspects of bed mobility with max instructional cueing in order to initiate any LLE movment.           Balance Balance Balance Assessed: Yes Static Sitting Balance Static Sitting - Balance Support: Right upper extremity supported;Left upper extremity supported Static Sitting - Level of Assistance: 4: Min assist   End of Session OT - End of Session Activity Tolerance: Patient limited by pain Patient left: in chair;with call bell/phone within reach;with family/visitor present Nurse Communication: Mobility status;Need for lift equipment     Marvis Saefong OTR/L Pager number [phone] 05/22/2012, 11:29 AM

## 2012-05-22 NOTE — Progress Notes (Signed)
Utilization review completed.  

## 2012-05-22 NOTE — Progress Notes (Signed)
Spoke with Dr. Kathrene Bongo regarding accessing HD catheter for first dose of vancomycin tonight. Per her order ok to access for first dose and subsequent doses will be given in dialysis. Levonne Spiller, RN

## 2012-05-22 NOTE — Care Management Note (Signed)
    Page 1 of 1   05/22/2012     9:45:51 AM   CARE MANAGEMENT NOTE 05/22/2012  Patient:  [name], [name]   Account Number:  [id number]  Date Initiated:  05/22/2012  Documentation initiated by:  Donn Pierini  Subjective/Objective Assessment:   Pt admitted s/p fall with femur fx- s/p repair     Action/Plan:   PTA pt lived at home with family, PT eval- pt will need SNF- CSW consulted for placement needs   Anticipated DC Date:  05/22/2012   Anticipated DC Plan:  SKILLED NURSING FACILITY  In-house referral  Clinical Social Worker      DC Planning Services  CM consult      Choice offered to / List presented to:             Status of service:  In process, will continue to follow Medicare Important Message given?   (If response is "NO", the following Medicare IM given date fields will be blank) Date Medicare IM given:   Date Additional Medicare IM given:    Discharge Disposition:    Per UR Regulation:  Reviewed for med. necessity/level of care/duration of stay  If discussed at Long Length of Stay Meetings, dates discussed:    Comments:

## 2012-05-22 NOTE — Progress Notes (Signed)
  Echocardiogram 2D Echocardiogram has been performed.  Karandeep Resende FRANCES 05/22/2012, 11:25 AM

## 2012-05-22 NOTE — Progress Notes (Signed)
Bilateral:  No evidence of DVT, superficial thrombosis, or Baker's Cyst.   

## 2012-05-22 NOTE — Progress Notes (Signed)
S: Pain better this AM  Fever earlier this AM O:BP 172/71  Pulse 111  Temp 101 F (38.3 C) (Oral)  Resp 16  Ht 5\' 3"  (1.6 m)  Wt 75.7 kg (166 lb 14.2 oz)  BMI 29.56 kg/m2  SpO2 96%  Intake/Output Summary (Last 24 hours) at 05/22/12 0940 Last data filed at 05/21/12 1751  Gross per 24 hour  Intake      0 ml  Output      0 ml  Net      0 ml   Weight change:  QMV:HQION and alert CVS:RRR Resp:Basilar crackles Abd:+ BS NTND Ext:Lt leg wrapped NEURO:Ox2  Does not know the year or who is president      . acetaminophen  1,000 mg Intravenous Q6H  . darbepoetin (ARANESP) injection - DIALYSIS  100 mcg Intravenous Q Mon-HD  . docusate sodium  100 mg Oral BID  . ferric gluconate (FERRLECIT/NULECIT) IV  125 mg Intravenous Q M,W,F-HD  . insulin aspart  0-5 Units Subcutaneous QHS  . insulin aspart  0-9 Units Subcutaneous TID WC  . insulin aspart  3 Units Subcutaneous TID WC  . insulin glargine  10 Units Subcutaneous QHS  . levothyroxine  75 mcg Oral QAC breakfast  . multivitamin  1 tablet Oral Daily  . niacin  500 mg Oral Daily  . paricalcitol  1 mcg Intravenous 3 times weekly  . polyethylene glycol  17 g Oral Daily  . vancomycin  1,500 mg Intravenous Once  . vancomycin  750 mg Intravenous Q M,W,F-HD   Dg Femur Left  05/20/2012  *RADIOLOGY REPORT*  Clinical Data: Post retrograde line and nail of the left femur  DG C-ARM 61-120 MIN, LEFT FEMUR - 2 VIEW  Comparison:  Left femur radiographs - 05/19/2012  Findings:  Six spot intraoperative fluoroscopic images of the left femur are provided for review.  Images demonstrate retrograde intramedullary nail fixation of previously noted distal diaphyseal/metaphyseal comminuted femur fracture. Alignment is much improved.  The distal end of the intramedullary rod is transfixed with three cancellous screws.  The proximal end of the femoral rods transfixed with two cancellous screws.  Skin staples overlying the proximal thigh. No radiopaque foreign  body.  IMPRESSION: Post intramedullary rod fixation of comminuted distal diaphyseal/metaphyseal femoral fracture.   Original Report Authenticated By: Tacey Ruiz, MD    Dg Femur Left Port  05/20/2012  *RADIOLOGY REPORT*  Clinical Data: Status post ORIF.  PORTABLE LEFT FEMUR - 2 VIEW  Comparison: Intraoperative fluoro spot images 05/20/2012.  Findings: The patient is status post placement of an intramedullary rod in the left femur.  Three distal and two proximal interlocking screws are in place.  There is slight anterior displacement of approximately one cortex width of the distal comminuted fracture. A moderate sized joint effusion is present.  Atherosclerotic calcifications are present within the femoral artery.  IMPRESSION:  1.  Near anatomic reduction of the comminuted distal femur fracture status post ORIF. 2.  No radiographic evidence for complication. 3.  Atherosclerosis.   Original Report Authenticated By: Marin Roberts, M.D.    Dg C-arm 61-120 Min  05/20/2012  *RADIOLOGY REPORT*  Clinical Data: Post retrograde line and nail of the left femur  DG C-ARM 61-120 MIN, LEFT FEMUR - 2 VIEW  Comparison:  Left femur radiographs - 05/19/2012  Findings:  Six spot intraoperative fluoroscopic images of the left femur are provided for review.  Images demonstrate retrograde intramedullary nail fixation of previously noted distal diaphyseal/metaphyseal comminuted  femur fracture. Alignment is much improved.  The distal end of the intramedullary rod is transfixed with three cancellous screws.  The proximal end of the femoral rods transfixed with two cancellous screws.  Skin staples overlying the proximal thigh. No radiopaque foreign body.  IMPRESSION: Post intramedullary rod fixation of comminuted distal diaphyseal/metaphyseal femoral fracture.   Original Report Authenticated By: Tacey Ruiz, MD    BMET    Component Value Date/Time   NA 136 05/22/2012 0625   K 4.0 05/22/2012 0625   CL 91* 05/22/2012  0625   CO2 23 05/22/2012 0625   GLUCOSE 171* 05/22/2012 0625   BUN 23 05/22/2012 0625   CREATININE 5.17* 05/22/2012 0625   CALCIUM 8.3* 05/22/2012 0625   CALCIUM 8.5 05/20/2012 0028   GFRNONAA 8* 05/22/2012 0625   GFRAA 9* 05/22/2012 0625   CBC    Component Value Date/Time   WBC 24.0* 05/22/2012 0625   RBC 3.46* 05/22/2012 0625   HGB 8.7* 05/22/2012 0625   HCT 28.4* 05/22/2012 0625   PLT 133* 05/22/2012 0625   MCV 82.1 05/22/2012 0625   MCH 25.1* 05/22/2012 0625   MCHC 30.6 05/22/2012 0625   RDW 17.4* 05/22/2012 0625   LYMPHSABS 1.3 05/19/2012 1746   MONOABS 1.2* 05/19/2012 1746   EOSABS 0.0 05/19/2012 1746   BASOSABS 0.0 05/19/2012 1746     Assessment:  1. Lt femur fx SP repair 2. Fever ? Catheter vs hip 3. ESRD MWF, had HD yest 4. Anemia 5. Sec HPTH 6. DM 7. Confusion probably due to narcotics and fever Plan: 1. BCx2 2. Start vanco 3. Plan HD thurs and sat and then MWF next week   Jaydeen Darley T

## 2012-05-22 NOTE — Progress Notes (Signed)
Orthopaedic Trauma Service (OTS)  Subjective: 2 Days Post-Op Procedure(s) (LRB): INTRAMEDULLARY (IM) RETROGRADE FEMORAL NAILING (Left)  Appears more awake and alert today phlebotomy at bedside getting blood cx  Pt started on vanc  Pt does seem to be somewhat confused still  No new ortho issues  Objective: Current Vitals Blood pressure 172/71, pulse 111, temperature 101 F (38.3 C), temperature source Oral, resp. rate 16, height 5\' 3"  (1.6 m), weight 75.7 kg (166 lb 14.2 oz), SpO2 96.00%. Vital signs in last 24 hours: Temp:  [99.1 F (37.3 C)-101 F (38.3 C)] 101 F (38.3 C) (12/24 0615) Pulse Rate:  [91-111] 111  (12/24 0615) Resp:  [15-24] 16  (12/24 0750) BP: (76-172)/(40-71) 172/71 mmHg (12/24 0615) SpO2:  [95 %-100 %] 96 % (12/24 0750) Weight:  [75.7 kg (166 lb 14.2 oz)-77.4 kg (170 lb 10.2 oz)] 75.7 kg (166 lb 14.2 oz) (12/24 0615)  Intake/Output from previous day:    LABS  Basename 05/22/12 0625 05/21/12 1025 05/20/12 0520 05/19/12 1746  HGB 8.7* 8.9* 9.9* 11.3*    Basename 05/22/12 0625 05/21/12 1025  WBC 24.0* 23.0*  RBC 3.46* 3.45*  HCT 28.4* 27.8*  PLT 133* 117*    Basename 05/22/12 0625 05/21/12 1025  NA 136 133*  K 4.0 4.4  CL 91* 91*  CO2 23 22  BUN 23 62*  CREATININE 5.17* 10.77*  GLUCOSE 171* 294*  CALCIUM 8.3* 7.5*    Basename 05/20/12 0028 05/19/12 1746  LABPT -- --  INR 1.04 1.06   25 OH Vitamin D: 13 (l)  Physical Exam  Gen: awake, more alert than yesterday but still somewhat confused and not following all instructions Lungs:  Crackles at bases Abd:+ BS, NT Ext:  Left lower extremity    Dressing c/d/i    Wound looks good and stable   No signs of infection   Distal motor and sensory functions intact grossly, but pt not really following instructions   Ext warm    Swelling stable    I did reposition the pt as she had pillows directly under her knee, place them under her calf    Assessment/Plan: 2 Days Post-Op  Procedure(s) (LRB): INTRAMEDULLARY (IM) RETROGRADE FEMORAL NAILING (Left)  66 y/o female s/p ground level fall   1. Fall  2. L supracondylar distal femur fracture POD 2  NWB x 6-8 weeks   Unrestricted ROM L knee   PT/OT evals   No pillows under knee at rest, can place under calf   Ice and elevate   Dressing change tomorrow 3. Metabolic bone disease workup   Labs pending   Poor bone quality likely attributable to ESRD   Continue per nephrology 4. Pain   Tylenol   Minimize narcs 5. DVT/PE prophylaxis   Defer to medicine service re: pharmacologics as pt with very low CrCl and recently seen at hospital for rectal bleed   SCD's  6. FEN   Advance diet as tolerated  7. ESRD   Per nephrology 8. ABL anemia   stable 9. Fever  Pt is quite ill  Potentially due to cath tip, surgical site looks good now but can not r/o  This am is the first elevated temp recorded but significant white count, which has been on the rise over last several days  Continue per Primary team   10. Dispo  Continue with therapies as able to do so  Pt will need snf  Ortho issues stable   vanc and cultures  Will continue to follow    Mearl Latin, PA-C [facility] [phone] (P) 05/22/2012, 9:49 AM

## 2012-05-22 NOTE — Plan of Care (Signed)
Problem: Phase I Progression Outcomes Goal: Voiding-avoid urinary catheter unless indicated Outcome: Not Met (add Reason) Pt anuric due to CKD

## 2012-05-22 NOTE — Progress Notes (Signed)
Physical Therapy Treatment Patient Details Name: [name] MRN: [id number] DOB: Mar 09, 1946 Today's Date: 05/22/2012 Time: 7829-5621 PT Time Calculation (min): 29 min  PT Assessment / Plan / Recommendation Comments on Treatment Session  pt still unable to follow direction or focus on task to participate well    Follow Up Recommendations  SNF     Does the patient have the potential to tolerate intense rehabilitation     Barriers to Discharge        Equipment Recommendations  Rolling walker with 5" wheels;Wheelchair cushion (measurements PT);Wheelchair (measurements PT)    Recommendations for Other Services    Frequency Min 3X/week   Plan Discharge plan remains appropriate;Frequency remains appropriate    Precautions / Restrictions Precautions Precautions: Fall Restrictions Weight Bearing Restrictions: Yes LLE Weight Bearing: Non weight bearing   Pertinent Vitals/Pain     Mobility  Bed Mobility Bed Mobility: Sit to Supine Supine to Sit: 1: +2 Total assist;HOB flat Supine to Sit: Patient Percentage: 10% Sitting - Scoot to Edge of Bed: Other (comment);2: Max assist (unable to mobilize without assist) Sit to Supine: 1: +2 Total assist;HOB flat Sit to Supine: Patient Percentage: 0% Details for Bed Mobility Assistance: Step-by step direction not enough to allow pt to participate well Transfers Transfers: Sit to Stand;Stand to Sit;Stand Pivot Transfers Sit to Stand: 1: +2 Total assist;From chair/3-in-1 Sit to Stand: Patient Percentage: 10% Stand to Sit: 1: +2 Total assist;To chair/3-in-1 Stand to Sit: Patient Percentage: 0% Stand Pivot Transfers: 1: +2 Total assist Stand Pivot Transfers: Patient Percentage: 10% Details for Transfer Assistance: vc's for step by step direction, but pt unable to attend well Ambulation/Gait Ambulation/Gait Assistance: Not tested (comment) Stairs: No    Exercises General Exercises - Lower Extremity Heel Slides: PROM;AAROM;Right;Left;10  reps;Supine;Other (comment) (L Knee AA/PROM to 70 degrees)   PT Diagnosis:    PT Problem List:   PT Treatment Interventions:     PT Goals Acute Rehab PT Goals PT Goal Formulation: Patient unable to participate in goal setting Time For Goal Achievement: 06/04/12 Potential to Achieve Goals: Good PT Goal: Supine/Side to Sit - Progress: Not progressing PT Goal: Sit to Supine/Side - Progress: Not progressing PT Goal: Sit to Stand - Progress: Not progressing PT Goal: Stand to Sit - Progress: Not progressing PT Transfer Goal: Bed to Chair/Chair to Bed - Progress: Not progressing  Visit Information  Last PT Received On: 05/22/12 Assistance Needed: +2    Subjective Data  Subjective: Pt lethargic, confused.     Cognition  Overall Cognitive Status: Impaired Area of Impairment: Attention;Memory;Safety/judgement;Following commands;Problem solving Arousal/Alertness: Lethargic Orientation Level: Disoriented to;Place;Time Behavior During Session: Lethargic Current Attention Level: Other (comment) (decreased focused attention) Attention - Other Comments: very little sustained Following Commands: Follows one step commands inconsistently Safety/Judgement: Decreased awareness of safety precautions    Balance  Balance Balance Assessed: Yes Static Sitting Balance Static Sitting - Balance Support: Feet supported;Bilateral upper extremity supported Static Sitting - Level of Assistance: 4: Min assist  End of Session PT - End of Session Equipment Utilized During Treatment: Gait belt Activity Tolerance: Patient limited by pain;Other (comment) (and decr mentation) Patient left: in bed;with call bell/phone within reach Nurse Communication: Weight bearing status;Mobility status   GP     Tylor Gambrill, Eliseo Gum 05/22/2012, 3:16 PM  05/22/2012  Chandler Bing, PT [phone] (pager)

## 2012-05-22 NOTE — Progress Notes (Signed)
[facility] PROGRESS NOTE Assessment/Plan: Femur fracture, left (05/19/2012) - s/p retrograde intramedullary nailing of left femur 12/22 per Dr. Carola Frost. - PT/ot consult SNF. - worked with PT rec pnding. pateint in a lot of pain. Continue robaxin and narcotics.   Fever: - Question of bacteremia secondary to a study catheter versus recent surgery. Surgical site and catheter area are not erythematous with no purulent drainage or tender to touch. - Overlapping of blood cultures x2, started on vancomycin every 48 hours empirically after dialysis.  - Tylenol for fevers, no swelling of legs, she is on SCD's.  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 - monitor Hbg.  HYPERTENSION (05/03/2006) - continue to hold Bp meds.  RENAL DISEASE, CHRONIC, STAGE V (07/29/2008) - [facility]; last HD on 12/20; K 4.3 yesterday. Hd today and again tomorrow per holiday schedule. - appreciate renal assistance.  Fall at home (05/19/2012) - no history of LOC.   Code Status: full Family Communication: none  Disposition Plan: SNF in am   Consultants:  ortho  Procedures: -  Intramedullary nailing of left femur 12/22 per Dr. Carola Frost   Antibiotics: Cefazolin one dose  12.22.2013  HPI/Subjective: Complaining of pain.  but much improved than yesterday.   Objective: Filed Vitals:   05/22/12 0000 05/22/12 0400 05/22/12 0615 05/22/12 0750  BP:   172/71   Pulse:   111   Temp:   101 F (38.3 C)   TempSrc:   Oral   Resp: 20 18 18 16   Height:      Weight:   75.7 kg (166 lb 14.2 oz)   SpO2: 97% 95% 95% 96%    Intake/Output Summary (Last 24 hours) at 05/22/12 1004 Last data filed at 05/21/12 1751  Gross per 24 hour  Intake      0 ml  Output      0 ml  Net      0 ml   Filed Weights   05/21/12 1331 05/21/12 1751 05/22/12 0615  Weight: 77.4 kg (170 lb 10.2 oz) 76.9 kg (169 lb 8.5 oz) 75.7 kg (166 lb 14.2 oz)    Exam:  General: Alert, awake, oriented x2. HEENT:  No bruits, no goiter.  Heart: Regular rate and rhythm, without murmurs, rubs, gallops.  Lungs: Good air movement, bilateral air movement.  Abdomen: Soft, nontender, nondistended, positive bowel sounds.   skin:No erythema, not tender to touch, no purulent drainage, either onto the surgical site or the diatek catheter.  Data Reviewed: Basic Metabolic Panel:  Lab 05/22/12 7829 05/21/12 1025 05/21/12 1020 05/20/12 0029 05/20/12 0028  NA 136 133* 133* 132* --  K 4.0 4.4 4.3 4.3 --  CL 91* 91* 90* 92* --  CO2 23 22 21 22  --  GLUCOSE 171* 294* 287* 113* --  BUN 23 62* 62* 37* --  CREATININE 5.17* 10.77* 10.69* 7.31* --  CALCIUM 8.3* 7.5* 7.6* 8.4 8.5  MG -- -- -- -- --  PHOS -- -- 6.2* -- --   Liver Function Tests:  Lab 05/21/12 1020 05/19/12 1746 05/17/12 1910  AST -- 19 25  ALT -- 13 28  ALKPHOS -- 165* 196*  BILITOT -- 0.3 0.2*  PROT -- 7.0 7.7  ALBUMIN 2.6* 2.8* 3.1*    Lab 05/17/12 1910  LIPASE 80*  AMYLASE --   No results found for this basename: AMMONIA:5 in the last 168 hours CBC:  Lab 05/22/12 0625 05/21/12 1025 05/20/12 0520 05/19/12 1746 05/17/12 1910  WBC 24.0* 23.0* 22.0* 19.2* 10.7*  NEUTROABS -- -- -- 16.7* 10.1*  HGB 8.7* 8.9* 9.9* 11.3* 11.3*  HCT 28.4* 27.8* 33.0* 36.2 35.8*  MCV 82.1 80.6 83.1 83.0 82.5  PLT 133* 117* 114* 127* 123*   Cardiac Enzymes: No results found for this basename: CKTOTAL:5,CKMB:5,CKMBINDEX:5,TROPONINI:5 in the last 168 hours BNP (last 3 results) No results found for this basename: PROBNP:3 in the last 8760 hours CBG:  Lab 05/22/12 0730 05/21/12 2132 05/21/12 1826 05/21/12 1131 05/21/12 0746  GLUCAP 187* 137* 116* 256* 330*    Recent Results (from the past 240 hour(s))  SURGICAL PCR SCREEN     Status: Normal   Collection Time   05/20/12  1:45 AM      Component Value Range Status Comment   MRSA, PCR NEGATIVE  NEGATIVE Final    Staphylococcus aureus NEGATIVE  NEGATIVE Final      Studies: Dg Femur Left  05/20/2012   *RADIOLOGY REPORT*  Clinical Data: Post retrograde line and nail of the left femur  DG C-ARM 61-120 MIN, LEFT FEMUR - 2 VIEW  Comparison:  Left femur radiographs - 05/19/2012  Findings:  Six spot intraoperative fluoroscopic images of the left femur are provided for review.  Images demonstrate retrograde intramedullary nail fixation of previously noted distal diaphyseal/metaphyseal comminuted femur fracture. Alignment is much improved.  The distal end of the intramedullary rod is transfixed with three cancellous screws.  The proximal end of the femoral rods transfixed with two cancellous screws.  Skin staples overlying the proximal thigh. No radiopaque foreign body.  IMPRESSION: Post intramedullary rod fixation of comminuted distal diaphyseal/metaphyseal femoral fracture.   Original Report Authenticated By: Tacey Ruiz, MD    Dg Femur Left Port  05/20/2012  *RADIOLOGY REPORT*  Clinical Data: Status post ORIF.  PORTABLE LEFT FEMUR - 2 VIEW  Comparison: Intraoperative fluoro spot images 05/20/2012.  Findings: The patient is status post placement of an intramedullary rod in the left femur.  Three distal and two proximal interlocking screws are in place.  There is slight anterior displacement of approximately one cortex width of the distal comminuted fracture. A moderate sized joint effusion is present.  Atherosclerotic calcifications are present within the femoral artery.  IMPRESSION:  1.  Near anatomic reduction of the comminuted distal femur fracture status post ORIF. 2.  No radiographic evidence for complication. 3.  Atherosclerosis.   Original Report Authenticated By: Marin Roberts, M.D.    Dg C-arm 61-120 Min  05/20/2012  *RADIOLOGY REPORT*  Clinical Data: Post retrograde line and nail of the left femur  DG C-ARM 61-120 MIN, LEFT FEMUR - 2 VIEW  Comparison:  Left femur radiographs - 05/19/2012  Findings:  Six spot intraoperative fluoroscopic images of the left femur are provided for review.  Images  demonstrate retrograde intramedullary nail fixation of previously noted distal diaphyseal/metaphyseal comminuted femur fracture. Alignment is much improved.  The distal end of the intramedullary rod is transfixed with three cancellous screws.  The proximal end of the femoral rods transfixed with two cancellous screws.  Skin staples overlying the proximal thigh. No radiopaque foreign body.  IMPRESSION: Post intramedullary rod fixation of comminuted distal diaphyseal/metaphyseal femoral fracture.   Original Report Authenticated By: Tacey Ruiz, MD     Scheduled Meds:    . acetaminophen  1,000 mg Intravenous Q6H  . darbepoetin (ARANESP) injection - DIALYSIS  100 mcg Intravenous Q Mon-HD  . docusate sodium  100 mg Oral BID  . ferric gluconate (FERRLECIT/NULECIT) IV  125 mg Intravenous Q M,W,F-HD  . insulin aspart  0-5 Units Subcutaneous QHS  . insulin aspart  0-9 Units Subcutaneous TID WC  . insulin aspart  3 Units Subcutaneous TID WC  . insulin glargine  10 Units Subcutaneous QHS  . levothyroxine  75 mcg Oral QAC breakfast  . multivitamin  1 tablet Oral Daily  . niacin  500 mg Oral Daily  . paricalcitol  1 mcg Intravenous 3 times weekly  . polyethylene glycol  17 g Oral Daily  . vancomycin  1,500 mg Intravenous Once  . vancomycin  750 mg Intravenous Q M,W,F-HD   Continuous Infusions:    Marinda Elk  [facility] Pager [phone]. If 8PM-8AM, please contact night-coverage at [email or web address], password [facility] 05/22/2012, 10:04 AM  LOS: 3 days

## 2012-05-22 NOTE — Progress Notes (Signed)
ANTIBIOTIC CONSULT NOTE - INITIAL  Pharmacy Consult for Vancomycin Indication: empiric s/p femur nailing  Allergies  Allergen Reactions  . Contrast Media (Iodinated Diagnostic Agents) Itching    Patient Measurements: Height: 5\' 3"  (160 cm) Weight: 166 lb 14.2 oz (75.7 kg) IBW/kg (Calculated) : 52.4   Labs:  Basename 05/22/12 0625 05/21/12 1025 05/21/12 1020 05/20/12 0520  WBC 24.0* 23.0* -- 22.0*  HGB 8.7* 8.9* -- 9.9*  PLT 133* 117* -- 114*  LABCREA -- -- -- --  CREATININE 5.17* 10.77* 10.69* --   Estimated Creatinine Clearance: 10.4 ml/min (by C-G formula based on Cr of 5.17). No results found for this basename: VANCOTROUGH:2,VANCOPEAK:2,VANCORANDOM:2,GENTTROUGH:2,GENTPEAK:2,GENTRANDOM:2,TOBRATROUGH:2,TOBRAPEAK:2,TOBRARND:2,AMIKACINPEAK:2,AMIKACINTROU:2,AMIKACIN:2, in the last 72 hours   Microbiology: Recent Results (from the past 720 hour(s))  SURGICAL PCR SCREEN     Status: Normal   Collection Time   05/20/12  1:45 AM      Component Value Range Status Comment   MRSA, PCR NEGATIVE  NEGATIVE Final    Staphylococcus aureus NEGATIVE  NEGATIVE Final     Medical History: Past Medical History  Diagnosis Date  . Dialysis care     M-W-F dialysis at [facility]  . COPD (chronic obstructive pulmonary disease)   . Diabetes mellitus   . Hypertension   . Thyroid disease   . Hyperlipidemia   . Bronchitis   . Leg pain   . Renal disorder    Assessment: 66 year old female with ESRD (HD MWF) s/p femur fracture nailing 12/22.  Now with ongoing fevers and increased WBC.  Pharmacy to begin IV Vancomycin  Goal of Therapy:  Appropriate vancomycin levels  Plan:  1) Vancomycin 1500 mg iv x 1 dose now (loading dose) 2) Vancomycin 750 mg iv Q HD ( MWF) 3) Follow up cultures, progress  Thank you. Okey Regal, PharmD [phone]  05/22/2012,9:21 AM

## 2012-05-23 DIAGNOSIS — I10 Essential (primary) hypertension: Secondary | ICD-10-CM

## 2012-05-23 LAB — CBC
HCT: 26.9 % — ABNORMAL LOW (ref 36.0–46.0)
Hemoglobin: 8.5 g/dL — ABNORMAL LOW (ref 12.0–15.0)
MCHC: 31.6 g/dL (ref 30.0–36.0)
MCV: 81.3 fL (ref 78.0–100.0)
RDW: 17.2 % — ABNORMAL HIGH (ref 11.5–15.5)
WBC: 24.5 10*3/uL — ABNORMAL HIGH (ref 4.0–10.5)

## 2012-05-23 LAB — GLUCOSE, CAPILLARY
Glucose-Capillary: 173 mg/dL — ABNORMAL HIGH (ref 70–99)
Glucose-Capillary: 153 mg/dL — ABNORMAL HIGH (ref 70–99)

## 2012-05-23 NOTE — Progress Notes (Signed)
S: no significant hip pain. O:BP 137/64  Pulse 106  Temp 102.6 F (39.2 C) (Oral)  Resp 20  Ht 5\' 3"  (1.6 m)  Wt 76.749 kg (169 lb 3.2 oz)  BMI 29.97 kg/m2  SpO2 90%  Intake/Output Summary (Last 24 hours) at 05/23/12 0804 Last data filed at 05/23/12 0865  Gross per 24 hour  Intake    360 ml  Output      0 ml  Net    360 ml   Weight change: -0.651 kg (-1 lb 7 oz) HQI:ONGEX and alert but moans for no reason CVS:RRR Resp:Basilar crackles Abd:+ BS NTND Ext: no edema NEURO:Ox2  Does not know the year or who is president      . darbepoetin (ARANESP) injection - DIALYSIS  100 mcg Intravenous Q Mon-HD  . docusate sodium  100 mg Oral BID  . ferric gluconate (FERRLECIT/NULECIT) IV  125 mg Intravenous Q M,W,F-HD  . insulin aspart  0-5 Units Subcutaneous QHS  . insulin aspart  0-9 Units Subcutaneous TID WC  . insulin aspart  3 Units Subcutaneous TID WC  . insulin glargine  10 Units Subcutaneous QHS  . levothyroxine  75 mcg Oral QAC breakfast  . multivitamin  1 tablet Oral Daily  . niacin  500 mg Oral Daily  . paricalcitol  1 mcg Intravenous 3 times weekly  . polyethylene glycol  17 g Oral Daily  . vancomycin  750 mg Intravenous Q M,W,F-HD   No results found. BMET    Component Value Date/Time   NA 136 05/22/2012 0625   K 4.0 05/22/2012 0625   CL 91* 05/22/2012 0625   CO2 23 05/22/2012 0625   GLUCOSE 171* 05/22/2012 0625   BUN 23 05/22/2012 0625   CREATININE 5.17* 05/22/2012 0625   CALCIUM 8.3* 05/22/2012 0625   CALCIUM 8.5 05/20/2012 0028   GFRNONAA 8* 05/22/2012 0625   GFRAA 9* 05/22/2012 0625   CBC    Component Value Date/Time   WBC 24.5* 05/23/2012 0510   RBC 3.31* 05/23/2012 0510   HGB 8.5* 05/23/2012 0510   HCT 26.9* 05/23/2012 0510   PLT 145* 05/23/2012 0510   MCV 81.3 05/23/2012 0510   MCH 25.7* 05/23/2012 0510   MCHC 31.6 05/23/2012 0510   RDW 17.2* 05/23/2012 0510   LYMPHSABS 1.3 05/19/2012 1746   MONOABS 1.2* 05/19/2012 1746   EOSABS 0.0  05/19/2012 1746   BASOSABS 0.0 05/19/2012 1746     Assessment:  1. Lt femur fx SP repair 2. Fever ? Catheter vs hip,  BC neg so far, on vanco 3. ESRD MWF, had HD yest 4. Anemia 5. Sec HPTH 6. DM 7. Confusion probably due to narcotics and fever Plan: 1.Plan HD in AM.  If BC + for gm + cocci and fevers don't improve then will need cath removal 2. Recheck labs in AM  [name] T

## 2012-05-23 NOTE — Progress Notes (Signed)
[facility] PROGRESS NOTE Assessment/Plan: Femur fracture, left (05/19/2012) - s/p retrograde intramedullary nailing of left femur 12/22 per Dr. Carola Frost. - PT/ot consult SNF. - worked with PT rec pnding. pateint in a lot of pain. Continue robaxin and narcotics.   Fever: - Continues to spike fevers. High WBC. patient continues to be mildly confused. - Question of bacteremia secondary to  catheter versus recent surgery. Surgical site and HD catheter area are not erythematous with no purulent drainage or tender to touch. -  Blood cultures x2 negative till date, started 12.24.2013 vancomycin every 48 hours empirically after dialysis.  - Tylenol for fevers, no swelling of legs, she is on SCD's.  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 - monitor Hbg.  HYPERTENSION (05/03/2006) - continue to hold Bp meds.  RENAL DISEASE, CHRONIC, STAGE V (07/29/2008) - [facility]; last HD on 12/20; K 4.3 yesterday. Hd today and again tomorrow per holiday schedule. - appreciate renal assistance.  Fall at home (05/19/2012) - no history of LOC.   Code Status: full Family Communication: none  Disposition Plan: SNF in am   Consultants:  ortho  Procedures: -  Intramedullary nailing of left femur 12/22 per Dr. Carola Frost - Echo: ejection fraction was in the range of 60% to 70%. - Doppler lower ext.: no DVT  Antibiotics: Cefazolin one dose  12.22.2013  HPI/Subjective: Complaining of pain.   Objective: Filed Vitals:   05/22/12 1400 05/22/12 1659 05/22/12 2100 05/23/12 0500  BP: 132/64  151/63 137/64  Pulse: 107  101 106  Temp: 102.5 F (39.2 C) 100.9 F (38.3 C) 99.1 F (37.3 C) 102.6 F (39.2 C)  TempSrc: Oral Oral    Resp: 20  20 20   Height:      Weight:    76.749 kg (169 lb 3.2 oz)  SpO2: 93%  96% 90%    Intake/Output Summary (Last 24 hours) at 05/23/12 0919 Last data filed at 05/23/12 0650  Gross per 24 hour  Intake    120 ml  Output      0 ml  Net     120 ml   Filed Weights   05/21/12 1751 05/22/12 0615 05/23/12 0500  Weight: 76.9 kg (169 lb 8.5 oz) 75.7 kg (166 lb 14.2 oz) 76.749 kg (169 lb 3.2 oz)    Exam:  General: Alert, awake, oriented x2. HEENT: No bruits, no goiter.  Heart: Regular rate and rhythm, without murmurs, rubs, gallops.  Lungs: Good air movement, bilateral air movement.  Abdomen: Soft, nontender, nondistended, positive bowel sounds.   skin:No erythema, not tender to touch, no purulent drainage, either onto the surgical site or the diatek catheter.  Data Reviewed: Basic Metabolic Panel:  Lab 05/22/12 1610 05/21/12 1025 05/21/12 1020 05/20/12 0029 05/20/12 0028  NA 136 133* 133* 132* --  K 4.0 4.4 4.3 4.3 --  CL 91* 91* 90* 92* --  CO2 23 22 21 22  --  GLUCOSE 171* 294* 287* 113* --  BUN 23 62* 62* 37* --  CREATININE 5.17* 10.77* 10.69* 7.31* --  CALCIUM 8.3* 7.5* 7.6* 8.4 8.5  MG -- -- -- -- --  PHOS -- -- 6.2* -- --   Liver Function Tests:  Lab 05/21/12 1020 05/19/12 1746 05/17/12 1910  AST -- 19 25  ALT -- 13 28  ALKPHOS -- 165* 196*  BILITOT -- 0.3 0.2*  PROT -- 7.0 7.7  ALBUMIN 2.6* 2.8* 3.1*    Lab 05/17/12 1910  LIPASE 80*  AMYLASE --   No results found for this basename: AMMONIA:5 in the last 168 hours CBC:  Lab 05/23/12 0510 05/22/12 0625 05/21/12 1025 05/20/12 0520 05/19/12 1746 05/17/12 1910  WBC 24.5* 24.0* 23.0* 22.0* 19.2* --  NEUTROABS -- -- -- -- 16.7* 10.1*  HGB 8.5* 8.7* 8.9* 9.9* 11.3* --  HCT 26.9* 28.4* 27.8* 33.0* 36.2 --  MCV 81.3 82.1 80.6 83.1 83.0 --  PLT 145* 133* 117* 114* 127* --   Cardiac Enzymes: No results found for this basename: CKTOTAL:5,CKMB:5,CKMBINDEX:5,TROPONINI:5 in the last 168 hours BNP (last 3 results) No results found for this basename: PROBNP:3 in the last 8760 hours CBG:  Lab 05/23/12 0714 05/22/12 2051 05/22/12 1658 05/22/12 1133 05/22/12 0730  GLUCAP 187* 194* 215* 229* 187*    Recent Results (from the past 240 hour(s))  SURGICAL PCR  SCREEN     Status: Normal   Collection Time   05/20/12  1:45 AM      Component Value Range Status Comment   MRSA, PCR NEGATIVE  NEGATIVE Final    Staphylococcus aureus NEGATIVE  NEGATIVE Final   CULTURE, BLOOD (ROUTINE X 2)     Status: Normal (Preliminary result)   Collection Time   05/22/12  9:34 AM      Component Value Range Status Comment   Specimen Description BLOOD HAND RIGHT   Final    Special Requests BOTTLES DRAWN AEROBIC AND ANAEROBIC 10CC EACH   Final    Culture  Setup Time 05/22/2012 18:15   Final    Culture     Final    Value:        BLOOD CULTURE RECEIVED NO GROWTH TO DATE CULTURE WILL BE HELD FOR 5 DAYS BEFORE ISSUING A FINAL NEGATIVE REPORT   Report Status PENDING   Incomplete   CULTURE, BLOOD (ROUTINE X 2)     Status: Normal (Preliminary result)   Collection Time   05/22/12  9:42 AM      Component Value Range Status Comment   Specimen Description BLOOD HAND RIGHT   Final    Special Requests BOTTLES DRAWN AEROBIC AND ANAEROBIC 10CC EACH   Final    Culture  Setup Time 05/22/2012 18:15   Final    Culture     Final    Value:        BLOOD CULTURE RECEIVED NO GROWTH TO DATE CULTURE WILL BE HELD FOR 5 DAYS BEFORE ISSUING A FINAL NEGATIVE REPORT   Report Status PENDING   Incomplete      Studies: No results found.  Scheduled Meds:    . darbepoetin (ARANESP) injection - DIALYSIS  100 mcg Intravenous Q Mon-HD  . docusate sodium  100 mg Oral BID  . ferric gluconate (FERRLECIT/NULECIT) IV  125 mg Intravenous Q M,W,F-HD  . insulin aspart  0-5 Units Subcutaneous QHS  . insulin aspart  0-9 Units Subcutaneous TID WC  . insulin aspart  3 Units Subcutaneous TID WC  . insulin glargine  10 Units Subcutaneous QHS  . levothyroxine  75 mcg Oral QAC breakfast  . multivitamin  1 tablet Oral Daily  . niacin  500 mg Oral Daily  . paricalcitol  1 mcg Intravenous 3 times weekly  . polyethylene glycol  17 g Oral Daily  . vancomycin  750 mg Intravenous Q M,W,F-HD   Continuous  Infusions:    Marinda Elk  [facility] Pager [phone]. If 8PM-8AM, please contact night-coverage at [email or web address], password [facility] 05/23/2012, 9:19 AM  LOS: 4 days

## 2012-05-23 NOTE — Progress Notes (Signed)
Patients temp=103 2 tylenol given to patient by Duanne Limerick, RN.  I notified Dr. Robb Matar.  No new orders received will continue to monitor.

## 2012-05-23 NOTE — Progress Notes (Signed)
Patient ID: [name], female   DOB: January 23, 1946, 66 y.o.   MRN: [id number]     Subjective:  Patient reports pain as mild to moderate.  Patient is alert but was unaware of time and place follow commands.  Objective:   VITALS:   Filed Vitals:   05/22/12 1400 05/22/12 1659 05/22/12 2100 05/23/12 0500  BP: 132/64  151/63 137/64  Pulse: 107  101 106  Temp: 102.5 F (39.2 C) 100.9 F (38.3 C) 99.1 F (37.3 C) 102.6 F (39.2 C)  TempSrc: Oral Oral    Resp: 20  20 20   Height:      Weight:    76.749 kg (169 lb 3.2 oz)  SpO2: 93%  96% 90%    ABD soft Sensation intact distally Dorsiflexion/Plantar flexion intact Incision: dressing C/D/I and no drainage EHL/FHL firing but not on command Patient reports sensation intact  LABS  Results for orders placed during the hospital encounter of 05/19/12 (from the past 24 hour(s))  GLUCOSE, CAPILLARY     Status: Abnormal   Collection Time   05/22/12 11:33 AM      Component Value Range   Glucose-Capillary 229 (*) 70 - 99 mg/dL   Comment 1 Notify RN    GLUCOSE, CAPILLARY     Status: Abnormal   Collection Time   05/22/12  4:58 PM      Component Value Range   Glucose-Capillary 215 (*) 70 - 99 mg/dL   Comment 1 Notify RN    GLUCOSE, CAPILLARY     Status: Abnormal   Collection Time   05/22/12  8:51 PM      Component Value Range   Glucose-Capillary 194 (*) 70 - 99 mg/dL  CBC     Status: Abnormal   Collection Time   05/23/12  5:10 AM      Component Value Range   WBC 24.5 (*) 4.0 - 10.5 K/uL   RBC 3.31 (*) 3.87 - 5.11 MIL/uL   Hemoglobin 8.5 (*) 12.0 - 15.0 g/dL   HCT 40.9 (*) 81.1 - 91.4 %   MCV 81.3  78.0 - 100.0 fL   MCH 25.7 (*) 26.0 - 34.0 pg   MCHC 31.6  30.0 - 36.0 g/dL   RDW 78.2 (*) 95.6 - 21.3 %   Platelets 145 (*) 150 - 400 K/uL  GLUCOSE, CAPILLARY     Status: Abnormal   Collection Time   05/23/12  7:14 AM      Component Value Range   Glucose-Capillary 187 (*) 70 - 99 mg/dL   Comment 1 Notify RN      No  results found.  Assessment/Plan: 3 Days Post-Op   Principal Problem:  *Fever Active Problems:  ANEMIA OF RENAL FAILURE  HYPERTENSION  RENAL DISEASE, CHRONIC, STAGE V  Femur fracture, left  Fall at home   Advance diet Up with therapy Continue plan per Medicine   [name] 05/23/2012, 10:47 AM   Teryl Lucy, MD Cell [phone] Pager [phone]

## 2012-05-24 ENCOUNTER — Inpatient Hospital Stay (HOSPITAL_COMMUNITY): Payer: Medicare Other

## 2012-05-24 DIAGNOSIS — G9341 Metabolic encephalopathy: Secondary | ICD-10-CM

## 2012-05-24 DIAGNOSIS — A0472 Enterocolitis due to Clostridium difficile, not specified as recurrent: Secondary | ICD-10-CM | POA: Diagnosis present

## 2012-05-24 DIAGNOSIS — A419 Sepsis, unspecified organism: Secondary | ICD-10-CM | POA: Diagnosis present

## 2012-05-24 DIAGNOSIS — R6520 Severe sepsis without septic shock: Secondary | ICD-10-CM

## 2012-05-24 DIAGNOSIS — E118 Type 2 diabetes mellitus with unspecified complications: Secondary | ICD-10-CM

## 2012-05-24 DIAGNOSIS — E1165 Type 2 diabetes mellitus with hyperglycemia: Secondary | ICD-10-CM

## 2012-05-24 DIAGNOSIS — R6521 Severe sepsis with septic shock: Secondary | ICD-10-CM | POA: Diagnosis present

## 2012-05-24 DIAGNOSIS — R509 Fever, unspecified: Secondary | ICD-10-CM

## 2012-05-24 DIAGNOSIS — J9600 Acute respiratory failure, unspecified whether with hypoxia or hypercapnia: Secondary | ICD-10-CM | POA: Diagnosis present

## 2012-05-24 DIAGNOSIS — N185 Chronic kidney disease, stage 5: Secondary | ICD-10-CM

## 2012-05-24 LAB — RENAL FUNCTION PANEL
Albumin: 2.1 g/dL — ABNORMAL LOW (ref 3.5–5.2)
Calcium: 8.3 mg/dL — ABNORMAL LOW (ref 8.4–10.5)
Creatinine, Ser: 10.96 mg/dL — ABNORMAL HIGH (ref 0.50–1.10)
GFR calc non Af Amer: 3 mL/min — ABNORMAL LOW (ref >90)
Phosphorus: 6.6 mg/dL — ABNORMAL HIGH (ref 2.3–4.6)

## 2012-05-24 LAB — BASIC METABOLIC PANEL
BUN: 32 mg/dL — ABNORMAL HIGH (ref 6–23)
Chloride: 93 mEq/L — ABNORMAL LOW (ref 96–112)
GFR calc Af Amer: 8 mL/min — ABNORMAL LOW (ref >90)
Potassium: 4.4 mEq/L (ref 3.5–5.1)
Sodium: 139 mEq/L (ref 135–145)

## 2012-05-24 LAB — POCT I-STAT 3, ART BLOOD GAS (G3+)
Acid-base deficit: 3 mmol/L — ABNORMAL HIGH (ref 0.0–2.0)
Bicarbonate: 22.7 mEq/L (ref 20.0–24.0)
pCO2 arterial: 42.7 mmHg (ref 35.0–45.0)
pO2, Arterial: 325 mmHg — ABNORMAL HIGH (ref 80.0–100.0)

## 2012-05-24 LAB — MRSA PCR SCREENING: MRSA by PCR: NEGATIVE

## 2012-05-24 LAB — GLUCOSE, CAPILLARY: Glucose-Capillary: 250 mg/dL — ABNORMAL HIGH (ref 70–99)

## 2012-05-24 LAB — BLOOD GAS, ARTERIAL
Acid-base deficit: 2.4 mmol/L — ABNORMAL HIGH (ref 0.0–2.0)
Bicarbonate: 21.5 mEq/L (ref 20.0–24.0)
O2 Saturation: 99.4 %
pCO2 arterial: 34.4 mmHg — ABNORMAL LOW (ref 35.0–45.0)
pO2, Arterial: 197 mmHg — ABNORMAL HIGH (ref 80.0–100.0)

## 2012-05-24 LAB — CBC WITH DIFFERENTIAL/PLATELET
Basophils Absolute: 0 10*3/uL (ref 0.0–0.1)
Eosinophils Absolute: 0 10*3/uL (ref 0.0–0.7)
Lymphs Abs: 0.7 10*3/uL (ref 0.7–4.0)
MCH: 25.3 pg — ABNORMAL LOW (ref 26.0–34.0)
MCHC: 30.9 g/dL (ref 30.0–36.0)
MCV: 82 fL (ref 78.0–100.0)
Monocytes Absolute: 2 10*3/uL — ABNORMAL HIGH (ref 0.1–1.0)
Monocytes Relative: 6 % (ref 3–12)
Platelets: 169 10*3/uL (ref 150–400)
RDW: 17.6 % — ABNORMAL HIGH (ref 11.5–15.5)
WBC: 33.1 10*3/uL — ABNORMAL HIGH (ref 4.0–10.5)

## 2012-05-24 LAB — CORTISOL: Cortisol, Plasma: 33.2 ug/dL

## 2012-05-24 LAB — PROCALCITONIN: Procalcitonin: 12.17 ng/mL

## 2012-05-24 LAB — CLOSTRIDIUM DIFFICILE BY PCR: Toxigenic C. Difficile by PCR: POSITIVE — AB

## 2012-05-24 LAB — LACTIC ACID, PLASMA: Lactic Acid, Venous: 1.1 mmol/L (ref 0.5–2.2)

## 2012-05-24 MED ORDER — SODIUM CHLORIDE 0.9 % IV BOLUS (SEPSIS)
500.0000 mL | Freq: Once | INTRAVENOUS | Status: AC
  Administered 2012-05-24: 500 mL via INTRAVENOUS

## 2012-05-24 MED ORDER — ETOMIDATE 2 MG/ML IV SOLN
10.0000 mg | Freq: Once | INTRAVENOUS | Status: AC
  Administered 2012-05-24: 10 mg via INTRAVENOUS

## 2012-05-24 MED ORDER — FAMOTIDINE IN NACL 20-0.9 MG/50ML-% IV SOLN
20.0000 mg | INTRAVENOUS | Status: DC
  Administered 2012-05-24 – 2012-05-25 (×2): 20 mg via INTRAVENOUS
  Filled 2012-05-24 (×3): qty 50

## 2012-05-24 MED ORDER — HEPARIN SODIUM (PORCINE) 1000 UNIT/ML DIALYSIS
1000.0000 [IU] | INTRAMUSCULAR | Status: DC | PRN
  Administered 2012-05-24: 1600 [IU] via INTRAVENOUS_CENTRAL
  Filled 2012-05-24: qty 4
  Filled 2012-05-24: qty 1

## 2012-05-24 MED ORDER — PIPERACILLIN-TAZOBACTAM IN DEX 2-0.25 GM/50ML IV SOLN
2.2500 g | Freq: Three times a day (TID) | INTRAVENOUS | Status: DC
  Administered 2012-05-24 – 2012-05-25 (×3): 2.25 g via INTRAVENOUS
  Filled 2012-05-24 (×7): qty 50

## 2012-05-24 MED ORDER — FENTANYL CITRATE 0.05 MG/ML IJ SOLN
25.0000 ug | INTRAMUSCULAR | Status: DC | PRN
  Administered 2012-05-24: 50 ug via INTRAVENOUS

## 2012-05-24 MED ORDER — VASOPRESSIN 20 UNIT/ML IJ SOLN
0.0300 [IU]/min | INTRAVENOUS | Status: DC
  Administered 2012-05-24: 0.03 [IU]/min via INTRAVENOUS
  Administered 2012-05-26: 0.02 [IU]/min via INTRAVENOUS
  Administered 2012-05-26: 0.03 [IU]/min via INTRAVENOUS
  Filled 2012-05-24 (×3): qty 2.5

## 2012-05-24 MED ORDER — CHLORHEXIDINE GLUCONATE 0.12 % MT SOLN
15.0000 mL | Freq: Two times a day (BID) | OROMUCOSAL | Status: DC
  Administered 2012-05-24 – 2012-05-25 (×3): 15 mL via OROMUCOSAL
  Filled 2012-05-24 (×3): qty 15

## 2012-05-24 MED ORDER — NOREPINEPHRINE BITARTRATE 1 MG/ML IJ SOLN
2.0000 ug/min | INTRAVENOUS | Status: AC
  Administered 2012-05-24: 5 ug/min via INTRAVENOUS
  Filled 2012-05-24: qty 4

## 2012-05-24 MED ORDER — SODIUM CHLORIDE 0.9 % IV SOLN
10.0000 ug/h | INTRAVENOUS | Status: DC
  Administered 2012-05-24: 50 ug/h via INTRAVENOUS
  Administered 2012-05-25: 150 ug/h via INTRAVENOUS
  Filled 2012-05-24 (×2): qty 50

## 2012-05-24 MED ORDER — METRONIDAZOLE IN NACL 5-0.79 MG/ML-% IV SOLN
500.0000 mg | Freq: Three times a day (TID) | INTRAVENOUS | Status: DC
  Administered 2012-05-24 – 2012-05-26 (×6): 500 mg via INTRAVENOUS
  Filled 2012-05-24 (×8): qty 100

## 2012-05-24 MED ORDER — FENTANYL CITRATE 0.05 MG/ML IJ SOLN
50.0000 ug | Freq: Once | INTRAMUSCULAR | Status: AC
  Administered 2012-05-24: 50 ug via INTRAVENOUS

## 2012-05-24 MED ORDER — SODIUM CHLORIDE 0.9 % IV SOLN
INTRAVENOUS | Status: DC
  Administered 2012-05-24 – 2012-05-30 (×3): via INTRAVENOUS

## 2012-05-24 MED ORDER — SODIUM CHLORIDE 0.9 % IV BOLUS (SEPSIS): 250.0000 mL | Freq: Once | INTRAVENOUS | Status: DC

## 2012-05-24 MED ORDER — DEXTROSE 5 % IV SOLN
2.0000 ug/min | INTRAVENOUS | Status: DC
  Administered 2012-05-24: 25 ug/min via INTRAVENOUS
  Administered 2012-05-25: 18 ug/min via INTRAVENOUS
  Filled 2012-05-24 (×3): qty 16

## 2012-05-24 MED ORDER — ALTEPLASE 2 MG IJ SOLR: 2.0000 mg | Freq: Once | INTRAMUSCULAR | Status: AC | PRN

## 2012-05-24 MED ORDER — MIDAZOLAM HCL 2 MG/2ML IJ SOLN
1.0000 mg | Freq: Once | INTRAMUSCULAR | Status: AC
  Administered 2012-05-24: 1 mg via INTRAVENOUS

## 2012-05-24 MED ORDER — NEPRO/CARBSTEADY PO LIQD: 237.0000 mL | ORAL | Status: DC | PRN

## 2012-05-24 MED ORDER — PANTOPRAZOLE SODIUM 40 MG IV SOLR
40.0000 mg | INTRAVENOUS | Status: DC
  Administered 2012-05-24: 40 mg via INTRAVENOUS
  Filled 2012-05-24 (×2): qty 40

## 2012-05-24 MED ORDER — MIDAZOLAM HCL 2 MG/2ML IJ SOLN
INTRAMUSCULAR | Status: AC
  Filled 2012-05-24: qty 4

## 2012-05-24 MED ORDER — SODIUM CHLORIDE 0.9 % IV SOLN: 100.0000 mL | INTRAVENOUS | Status: DC | PRN

## 2012-05-24 MED ORDER — BIOTENE DRY MOUTH MT LIQD
15.0000 mL | Freq: Four times a day (QID) | OROMUCOSAL | Status: DC
  Administered 2012-05-24 – 2012-05-25 (×3): 15 mL via OROMUCOSAL

## 2012-05-24 MED ORDER — INSULIN ASPART 100 UNIT/ML ~~LOC~~ SOLN
0.0000 [IU] | SUBCUTANEOUS | Status: DC
  Administered 2012-05-24: 2 [IU] via SUBCUTANEOUS
  Administered 2012-05-24: 3 [IU] via SUBCUTANEOUS
  Administered 2012-05-24: 5 [IU] via SUBCUTANEOUS

## 2012-05-24 MED ORDER — SUCCINYLCHOLINE CHLORIDE 20 MG/ML IJ SOLN
60.0000 mg | Freq: Once | INTRAMUSCULAR | Status: AC
  Administered 2012-05-24: 60 mg via INTRAVENOUS
  Filled 2012-05-24: qty 3

## 2012-05-24 MED ORDER — LIDOCAINE-PRILOCAINE 2.5-2.5 % EX CREA: 1.0000 "application " | TOPICAL_CREAM | CUTANEOUS | Status: DC | PRN

## 2012-05-24 MED ORDER — PARICALCITOL 5 MCG/ML IV SOLN
INTRAVENOUS | Status: AC
  Administered 2012-05-24: 1 ug via INTRAVENOUS
  Filled 2012-05-24: qty 1

## 2012-05-24 MED ORDER — LIDOCAINE HCL (PF) 1 % IJ SOLN: 5.0000 mL | INTRAMUSCULAR | Status: DC | PRN

## 2012-05-24 MED ORDER — PENTAFLUOROPROP-TETRAFLUOROETH EX AERO: 1.0000 "application " | INHALATION_SPRAY | CUTANEOUS | Status: DC | PRN

## 2012-05-24 MED ORDER — FENTANYL CITRATE 0.05 MG/ML IJ SOLN
INTRAMUSCULAR | Status: AC
  Filled 2012-05-24: qty 4

## 2012-05-24 MED ORDER — MIDAZOLAM HCL 2 MG/2ML IJ SOLN
1.0000 mg | INTRAMUSCULAR | Status: DC | PRN
  Administered 2012-05-24: 1 mg via INTRAVENOUS
  Administered 2012-05-24: 2 mg via INTRAVENOUS
  Filled 2012-05-24: qty 2

## 2012-05-24 MED ORDER — VANCOMYCIN 50 MG/ML ORAL SOLUTION
500.0000 mg | Freq: Four times a day (QID) | ORAL | Status: DC
  Administered 2012-05-24 – 2012-05-26 (×8): 500 mg
  Filled 2012-05-24 (×12): qty 10

## 2012-05-24 NOTE — Progress Notes (Signed)
Clinical Social Worker staffed case with MD.  CSW to continue to follow and assist as needed.   Angelia Mould, MSW, [place] [phone]

## 2012-05-24 NOTE — Progress Notes (Signed)
Hemodialysis-  Patient lethargic but arousible on arrival to unit.  About 1 hour into Hemodialysis treatment oxygen stats dropped into 70's Dr. Hyman Hopes notified, ordered to continue UF and get stat Chest x-ray, done. Decreased LOC during last 30 minutes of treatment, able to follow commands, temperature 100.3 upon arrival now 98.9. Spoke with Dr.Ortiz and Dr. Hyman Hopes ordered stat ABG and PC removal,  PA to schedule new insertion. Pt transferred to 2100 on non-rebreather, vitals stable. Report given to Thayer Ohm, [place]

## 2012-05-24 NOTE — Significant Event (Signed)
On arrival to ICU pt had progressive mental status decline, worsening hypotension, and increased respiratory distress with paradoxical breathing.  Discussed status with husband.  He is agreeable to aggressive measures including intubation and mechanical ventilation.  Patient was intubated.  Noted to have thick secretions from his airway.  Critical care time 60 minutes.  Coralyn Helling, MD [facility] 05/24/2012, 12:49 PM Pager:  [phone] After 3pm call: [phone]

## 2012-05-24 NOTE — Progress Notes (Signed)
eLink Physician-Brief Progress Note Patient Name: [name] DOB: July 09, 1945 MRN: [id number]  Date of Service  05/24/2012   HPI/Events of Note  Pt with C diff colitis and shock  eICU Interventions  See vasopressin order   Intervention Category Major Interventions: Shock - evaluation and management  Shan Levans 05/24/2012, 5:32 PM

## 2012-05-24 NOTE — Consult Note (Signed)
PULMONARY  / CRITICAL CARE MEDICINE  Name: [name] MRN: [id number] DOB: 06-18-1945    LOS: 5  REFERRING MD :  Dr. David Stall [facility])  CHIEF COMPLAINT:  Sepsis  BRIEF PATIENT DESCRIPTION: 66 y/o F admitted with L hip fracture s/p nailing 12/22.  ESRD withr ecent Perm Cath exchange 12/19.  12/26 noted to have fever of 103, tachycardia, dyspnea and change in mental status concerning for sepsis.  PCCM consulted for sepsis.     PMH:   recent fall with L Hip fracture s/p intramedullary nailing of L femur on 12/22 [facility]), ESRD on HD, ER visit for rectal bleeding 12/19  LINES / TUBES: 12/19 IJ Tunneled Cath (exchanged)>>>>  CULTURES: 12/24 MRSA PCR>>>neg 12/24 BCx2>>> 12/26 BCx2>>> 12/26 C diff PCR >>>   ANTIBIOTICS: 12/25 Vanco>>> 12/26 Zosyn>>>   SIGNIFICANT EVENTS:  12/19 - Admit post fall with L hip fracture 12/22 - L hip repair with intramedullary nailing of L femur (Dr. Carola Frost)  LEVEL OF CARE:  ICU PRIMARY SERVICE:  TRH -->PCCM CONSULTANTS:  Handy CODE STATUS: Full Code DIET:  NPO DVT Px: SCD's GI Px:  None indicated  HISTORY OF PRESENT ILLNESS:  66 y/o F with PMH of ESRD on HD, DM, HTN, Thyroid disease,  recent ER visit for rectal bleeding 12/19 admitted with L hip fracture s/p nailing 12/22.  Recent [employer] 12/19 over a wire per IR.  12/26 noted to have fever of 103, tachycardia, dyspnea and change in mental status concerning for sepsis.      PAST MEDICAL HISTORY :  Past Medical History  Diagnosis Date  . Dialysis care     M-W-F dialysis at [facility]  . COPD (chronic obstructive pulmonary disease)   . Diabetes mellitus   . Hypertension   . Thyroid disease   . Hyperlipidemia   . Bronchitis   . Leg pain   . Renal disorder    Past Surgical History  Procedure Date  . Cholecystectomy   . Dg av dialysis shunt access exist*r* or     working right HD catheter  . Av fistula placement 05/18/2010  . Femur im nail 05/20/2012   Procedure: INTRAMEDULLARY (IM) RETROGRADE FEMORAL NAILING;  Surgeon: Budd Palmer, MD;  Location: [facility] OR;  Service: Orthopedics;  Laterality: Left;   Prior to Admission medications   Medication Sig Start Date End Date Taking? Authorizing Provider  bisacodyl (DULCOLAX) 5 MG EC tablet Take 1 tablet (5 mg total) by mouth 2 (two) times daily. 05/17/12  Yes John-Adam Bonk, MD  doxazosin (CARDURA) 8 MG tablet Take 8 mg by mouth at bedtime.     Yes Historical Provider, MD  fluticasone (FLONASE) 50 MCG/ACT nasal spray Place 2 sprays into the nose Daily. 05/02/12  Yes Historical Provider, MD  glipiZIDE (GLUCOTROL) 10 MG tablet Take 10 mg by mouth 2 (two) times daily before a meal.     Yes Historical Provider, MD  insulin lispro protamine-insulin lispro (HUMALOG 75/25) (75-25) 100 UNIT/ML SUSP Inject 15-35 Units into the skin. Patient takes 35 units every morning and 15 units in the evening    Yes Historical Provider, MD  levothyroxine (SYNTHROID, LEVOTHROID) 75 MCG tablet Take 75 mcg by mouth daily.     Yes Historical Provider, MD  lisinopril (PRINIVIL,ZESTRIL) 20 MG tablet Take 20 mg by mouth every evening.    Yes Historical Provider, MD  multivitamin (RENA-VIT) TABS tablet Take 1 tablet by mouth daily.   Yes Historical Provider, MD  niacin (NIASPAN) 500 MG CR tablet Take 500 mg by mouth daily.    Yes Historical Provider, MD   Allergies  Allergen Reactions  . Contrast Media (Iodinated Diagnostic Agents) Itching    FAMILY HISTORY:  No family history on file. SOCIAL HISTORY:  reports that she has been smoking Cigarettes.  She has a 15 pack-year smoking history. She does not have any smokeless tobacco history on file. She reports that she does not drink alcohol or use illicit drugs.  REVIEW OF SYSTEMS:  Unable to complete as pt is altered   INTERVAL HISTORY: Elderly female, awake, lying on stretcher, tachypnea noted  VITAL SIGNS: Temp:  [98.1 F (36.7 C)-103 F (39.4 C)] 100.3 F (37.9 C)  (12/26 0700) Pulse Rate:  [93-121] 118  (12/26 1008) Resp:  [16-30] 30  (12/26 0700) BP: (77-155)/(32-73) 92/42 mmHg (12/26 1014) SpO2:  [90 %-97 %] 96 % (12/26 0700) Weight:  [166 lb 12.8 oz (75.66 kg)-167 lb 12.3 oz (76.1 kg)] 167 lb 12.3 oz (76.1 kg) (12/26 0700)  HEMODYNAMICS:    VENTILATOR SETTINGS:    INTAKE / OUTPUT: Intake/Output      12/25 0701 - 12/26 0700 12/26 0701 - 12/27 0700   P.O.     Total Intake(mL/kg)     Other  1229   Total Output  1229   Net  -1229        Stool Occurrence 1 x    Emesis Occurrence 1 x      PHYSICAL EXAMINATION: General: chronically ill female, mild respiratory distress Neuro:  Awakens, follows commands, slow to respond HEENT:  Mm pink/dry, no jvd Cardiovascular:  s1s2 rrr, no m/r/g Lungs:  Mild accessory muscle use, lungs bilaterally coarse Abdomen:  Round/soft, bsx4 hypoactive Musculoskeletal:  No acute deformities, L hip dressing c/d/i, L ankle wrapped with ace wrap Skin:  Warm/dry, no edema   LABS: Cbc  Lab 05/24/12 0729 05/23/12 0510 05/22/12 0625  WBC 33.1* -- --  HGB 8.0* 8.5* 8.7*  HCT 25.9* 26.9* 28.4*  PLT 169 145* 133*   Chemistry  Lab 05/24/12 0729 05/22/12 0625 05/21/12 1025 05/21/12 1020  NA 139 136 133* --  K 4.7 4.0 4.4 --  CL 94* 91* 91* --  CO2 21 23 22  --  BUN 66* 23 62* --  CREATININE 10.96* 5.17* 10.77* --  CALCIUM 8.3* 8.3* 7.5* --  MG -- -- -- --  PHOS 6.6* -- -- 6.2*  GLUCOSE 220* 171* 294* --   Liver fxn  Lab 05/24/12 0729 05/21/12 1020 05/19/12 1746 05/17/12 1910  AST -- -- 19 25  ALT -- -- 13 28  ALKPHOS -- -- 165* 196*  BILITOT -- -- 0.3 0.2*  PROT -- -- 7.0 7.7  ALBUMIN 2.1* 2.6* 2.8* --   coags  Lab 05/20/12 0028 05/19/12 1746 05/17/12 1910  APTT -- -- 27  INR 1.04 1.06 1.05   Sepsis markers  Lab 05/17/12 1910  LATICACIDVEN 3.5*  PROCALCITON --   Cardiac markers No results found for this basename: CKTOTAL:3,CKMB:3,TROPONINI:3 in the last 168 hours  BNP No results  found for this basename: PROBNP:3 in the last 168 hours  ABG No results found for this basename: PHART:3,PCO2ART:3,PO2ART:3,HCO3:3,TCO2:3 in the last 168 hours  CBG trend  Lab 05/23/12 2041 05/23/12 1700 05/23/12 1133 05/23/12 0714 05/22/12 2051  GLUCAP 153* 149* 173* 187* 194*    IMAGING: 12/19 CT ABD / PELVIS>>> ? Early appendicitis, diverticulosis of sigmoid colon without evidence of diverticulitis, renal cysts noted  12/26 CXR>>>pulmonary vascular congestion  ECG: 12/22 - reviewed, NSR  DIAGNOSES: Principal Problem:  *Acute respiratory failure Active Problems:  ANEMIA OF RENAL FAILURE  HYPERTENSION  RENAL DISEASE, CHRONIC, STAGE V  Femur fracture, left  Fall at home  Fever   ASSESSMENT / PLAN:  PULMONARY  ASSESSMENT: Acute Respiratory Distress - in setting of sepsis ? Hx COPD - no evidence of acute flare  PLAN:   -O2 to support sats > 90% -monitor resp status, concern she will need intubation -VQ scan to rule out PE in setting of dyspnea, recent L hip surgery, though low suspicion   CARDIOVASCULAR  ASSESSMENT:  Sinus Tachycardia  - in setting of sepsis, concern for infected perm cath. Hypotension  - mild, sbp 90's Hx HTN  PLAN:  -NS 250 ml bolus -may need additional volume -consider stress steroids pending cortisol  RENAL  ASSESSMENT:   Chronic Renal Failure  - HD M/W/F  PLAN:   -HD per Nephrology -will place temporary HD catheter, remove Perm Cath (discussed with Dr. Hyman Hopes) -f/u BMP in am  GASTROINTESTINAL  ASSESSMENT:   Rectal Bleeding - noted ER visit 12/19 with positive FOB, none noted this admit.  H/H stable  PLAN:   -monitor h/h -hold heparin products  HEMATOLOGIC  ASSESSMENT:   Anemia - in setting of chronic disease  PLAN:  -no indication for transfusion at this time -f/u BMP  INFECTIOUS  ASSESSMENT:   Severe Sepsis - concern for line (perm cath) related sepsis.  Noted spikes of fever during HD, rising WBC.  Line recently  changed over wire 12/19.   ? Appendicitis/sigmoid colon thickening  - noted on CT scan previous visit with rectal bleeding, rule out abdominal source Diarrhea with recent Abx use  PLAN:   -vanco / zosyn as above -blood cultures -repeat abd / pelvis CT to rule out abd source infection -check lactic acid, pct, cortisol -check stool for C diff PCR  ENDOCRINE  ASSESSMENT:   DM  PLAN:   -SSI  NEUROLOGIC  ASSESSMENT:   Acute Encephalopathy  - in setting of severe sepsis  PLAN:   -supportive care, correct underlying cause -monitor mental status    I have personally obtained a history, examined the patient, evaluated laboratory and imaging results, formulated the assessment and plan and placed orders.  CRITICAL CARE: The patient is critically ill with multiple organ systems failure and requires high complexity decision making for assessment and support, frequent evaluation and titration of therapies, application of advanced monitoring technologies and extensive interpretation of multiple databases. Critical Care Time devoted to patient care services described in this note is 40 minutes.     Canary Brim, NP-C Pulmonary and Critical Care Medicine [facility] Pager: [phone]  05/24/2012, 10:20 AM   Reviewed above, examined pt, and agree with assessment/plan.  66 yo female with altered mental status SIRS/Severe Sepsis likely related to HD catheter.  Of note is that she had recent episode of rectal bleeding and CT abd from 12/19 showed ?sigmoid colon thickening and ?early appendicitis.  Abdominal exam benign.  Having diarrhea.  Will check stool for C diff and repeat CT abd/pelvis with oral contrast.  Will change HD catheter site.  Add zosyn to vancomycin for abx coverage, and follow up culture results.  Will place on PCCM service while in ICU.  Coralyn Helling, MD [facility] Pulmonary/Critical Care 05/24/2012, 10:54 AM Pager:  [phone] After 3pm call: [phone]

## 2012-05-24 NOTE — Progress Notes (Signed)
eLink Physician-Brief Progress Note Patient Name: [name] DOB: 1945-09-17 MRN: [id number]  Date of Service  05/24/2012   HPI/Events of Note   C diff positive  eICU Interventions  Vanco per tube and IV flagyl ordered PPI changed to H2 blocker   Intervention Category Major Interventions: Infection - evaluation and management  Shan Levans 05/24/2012, 4:28 PM

## 2012-05-24 NOTE — Progress Notes (Signed)
PT Cancellation Note  Patient Details Name: [name] MRN: [id number] DOB: Dec 18, 1945   Cancelled Treatment:    Reason Eval/Treat Not Completed: Medical issues which prohibited therapy. Spoke with RN and MD, pt being transferred to 2100 for sepsis. Will attempt treatment tomorrow pending medical stability.   05/24/2012 Milana Kidney DPT PAGER: [phone] OFFICE: [phone]     Milana Kidney 05/24/2012, 10:26 AM

## 2012-05-24 NOTE — Progress Notes (Signed)
Patient to hemodialysis via bed with dialysis RNs.  [name]

## 2012-05-24 NOTE — Progress Notes (Signed)
[facility] PROGRESS NOTE Assessment/Plan: Acute respiratory failure: - Stat ABG, not on heparin or coumadin for DVT prophylaxis post hip repair,  as she had an episode of rectal bleed on 12.19.2013 with positive FOBT. She was placed on SCD's here in the hospital since admission. - Stat Ct angio to rule out PE 12.26.2013. - Patient is on a nonrebreather sating 85%. Using accessory muscle to breath and breathing 25-30 times per minutes. - will consult PCCM for evaluation. - Bp low give 250 cc bolus.  Fever: -  Fevers improved. High WBC. Patient lethargic - Question of bacteremia secondary to  catheter versus recent surgery.  - Surgical site and HD catheter area are not erythematous with no purulent drainage or tender to touch. -  Blood cultures x2 negative till date, started 12.24.2013 vancomycin, zosyn added on 12.26.2013. - Tylenol for fevers, no swelling of legs, she is on SCD's.  Femur fracture, left (05/19/2012) - s/p retrograde intramedullary nailing of left femur 12/22 per Dr. Carola Frost. - PT/ot consult SNF.  Encephalopathy: - worsening today. - Patient only oriented to person most likely 2/2 to hypoxia. - ABG pending.  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 - monitor Hbg.  HYPERTENSION (05/03/2006) - continue to hold Bp meds.  RENAL DISEASE, CHRONIC, STAGE V (07/29/2008) - [facility]; last HD on 12/20; K 4.3 yesterday. Hd today and again tomorrow per holiday schedule. - appreciate renal assistance.  Fall at home (05/19/2012) - no history of LOC.   Code Status: full Family Communication: none  Disposition Plan: SNF in am   Consultants:  ortho  Procedures: -  Intramedullary nailing of left femur 12/22 per Dr. Carola Frost - Echo: ejection fraction was in the range of 60% to 70%. - Doppler lower ext.: no DVT  Antibiotics: Cefazolin one dose  12.22.2013  HPI/Subjective: Patient is more confused today.   Objective: Filed Vitals:     05/24/12 0907 05/24/12 0924 05/24/12 0928 05/24/12 0942  BP: 77/37 86/36 94/37  82/32  Pulse: 117 112 111 106  Temp:      TempSrc:      Resp:      Height:      Weight:      SpO2:       No intake or output data in the 24 hours ending 05/24/12 0956 Filed Weights   05/23/12 0500 05/24/12 0500 05/24/12 0700  Weight: 76.749 kg (169 lb 3.2 oz) 75.66 kg (166 lb 12.8 oz) 76.1 kg (167 lb 12.3 oz)    Exam:  General: Alert, awake, oriented x1, using accessory muscle to breath HEENT: No bruits, no goiter. No JVD. Heart: Regular rate and rhythm, without murmurs, rubs, gallops.  Lungs: moderate air movement, bilateral air movement.  Abdomen: Soft, nontender, nondistended, positive bowel sounds.  Skin: No erythema, not tender to touch, no purulent drainage, either onto the surgical site or the diatek catheter.  Data Reviewed: Basic Metabolic Panel:  Lab 05/24/12 2956 05/22/12 0625 05/21/12 1025 05/21/12 1020 05/20/12 0029  NA 139 136 133* 133* 132*  K 4.7 4.0 4.4 4.3 4.3  CL 94* 91* 91* 90* 92*  CO2 21 23 22 21 22   GLUCOSE 220* 171* 294* 287* 113*  BUN 66* 23 62* 62* 37*  CREATININE 10.96* 5.17* 10.77* 10.69* 7.31*  CALCIUM 8.3* 8.3* 7.5* 7.6* 8.4  MG -- -- -- -- --  PHOS 6.6* -- -- 6.2* --   Liver Function Tests:  Lab 05/24/12 0729 05/21/12 1020 05/19/12 1746  05/17/12 1910  AST -- -- 19 25  ALT -- -- 13 28  ALKPHOS -- -- 165* 196*  BILITOT -- -- 0.3 0.2*  PROT -- -- 7.0 7.7  ALBUMIN 2.1* 2.6* 2.8* 3.1*    Lab 05/17/12 1910  LIPASE 80*  AMYLASE --   No results found for this basename: AMMONIA:5 in the last 168 hours CBC:  Lab 05/24/12 0729 05/23/12 0510 05/22/12 0625 05/21/12 1025 05/20/12 0520 05/19/12 1746 05/17/12 1910  WBC 33.1* 24.5* 24.0* 23.0* 22.0* -- --  NEUTROABS 30.4* -- -- -- -- 16.7* 10.1*  HGB 8.0* 8.5* 8.7* 8.9* 9.9* -- --  HCT 25.9* 26.9* 28.4* 27.8* 33.0* -- --  MCV 82.0 81.3 82.1 80.6 83.1 -- --  PLT 169 145* 133* 117* 114* -- --   Cardiac  Enzymes: No results found for this basename: CKTOTAL:5,CKMB:5,CKMBINDEX:5,TROPONINI:5 in the last 168 hours BNP (last 3 results) No results found for this basename: PROBNP:3 in the last 8760 hours CBG:  Lab 05/23/12 2041 05/23/12 1700 05/23/12 1133 05/23/12 0714 05/22/12 2051  GLUCAP 153* 149* 173* 187* 194*    Recent Results (from the past 240 hour(s))  SURGICAL PCR SCREEN     Status: Normal   Collection Time   05/20/12  1:45 AM      Component Value Range Status Comment   MRSA, PCR NEGATIVE  NEGATIVE Final    Staphylococcus aureus NEGATIVE  NEGATIVE Final   CULTURE, BLOOD (ROUTINE X 2)     Status: Normal (Preliminary result)   Collection Time   05/22/12  9:34 AM      Component Value Range Status Comment   Specimen Description BLOOD HAND RIGHT   Final    Special Requests BOTTLES DRAWN AEROBIC AND ANAEROBIC 10CC EACH   Final    Culture  Setup Time 05/22/2012 18:15   Final    Culture     Final    Value:        BLOOD CULTURE RECEIVED NO GROWTH TO DATE CULTURE WILL BE HELD FOR 5 DAYS BEFORE ISSUING A FINAL NEGATIVE REPORT   Report Status PENDING   Incomplete   CULTURE, BLOOD (ROUTINE X 2)     Status: Normal (Preliminary result)   Collection Time   05/22/12  9:42 AM      Component Value Range Status Comment   Specimen Description BLOOD HAND RIGHT   Final    Special Requests BOTTLES DRAWN AEROBIC AND ANAEROBIC 10CC EACH   Final    Culture  Setup Time 05/22/2012 18:15   Final    Culture     Final    Value:        BLOOD CULTURE RECEIVED NO GROWTH TO DATE CULTURE WILL BE HELD FOR 5 DAYS BEFORE ISSUING A FINAL NEGATIVE REPORT   Report Status PENDING   Incomplete      Studies: Dg Chest Port 1 View  05/24/2012  Garnetta Buddy, MD     05/24/2012  9:17 AM I have seen and examined this patient and agree with the plan of  care. Low blood pressure and short of breath. patient now being  ultrafiltered. Check CXR doubt that more fluid can be removed.  WEBB,MARTIN W 05/24/2012, 9:16 AM       Scheduled Meds:    . darbepoetin (ARANESP) injection - DIALYSIS  100 mcg Intravenous Q Mon-HD  . docusate sodium  100 mg Oral BID  . ferric gluconate (FERRLECIT/NULECIT) IV  125 mg Intravenous Q M,W,F-HD  . insulin  aspart  0-5 Units Subcutaneous QHS  . insulin aspart  0-9 Units Subcutaneous TID WC  . insulin aspart  3 Units Subcutaneous TID WC  . insulin glargine  10 Units Subcutaneous QHS  . levothyroxine  75 mcg Oral QAC breakfast  . multivitamin  1 tablet Oral Daily  . niacin  500 mg Oral Daily  . paricalcitol  1 mcg Intravenous 3 times weekly  . polyethylene glycol  17 g Oral Daily  . vancomycin  750 mg Intravenous Q M,W,F-HD   Continuous Infusions:    Marinda Elk  [facility] Pager [phone]. If 8PM-8AM, please contact night-coverage at [email or web address], password [facility] 05/24/2012, 9:56 AM  LOS: 5 days

## 2012-05-24 NOTE — Procedures (Signed)
Arterial Catheter Insertion Procedure Note [name] [id number] Aug 31, 1945  Procedure: Insertion of Arterial Catheter  Indications: Blood pressure monitoring  Procedure Details Consent: Risks of procedure as well as the alternatives and risks of each were explained to the (patient/caregiver).  Consent for procedure obtained. Time Out: Verified patient identification, verified procedure, site/side was marked, verified correct patient position, special equipment/implants available, medications/allergies/relevent history reviewed, required imaging and test results available.  Performed  Maximum sterile technique was used including antiseptics, cap, gloves, gown, hand hygiene and mask. Skin prep: Chlorhexidine; local anesthetic administered 20 gauge catheter was inserted into left radial artery using the Seldinger technique.  Evaluation Blood flow good; BP tracing good. Complications: No apparent complications.   Lysbeth Penner [facility] 05/24/2012

## 2012-05-24 NOTE — Progress Notes (Signed)
Patient unstable admission to ICU today and after stabilization this RN found several orders pertaining to ortho devices and made contact with Dr. Dion Saucier who gave recommendations to allow Ortho PA to follow up with this in the AM as the patient was not as mobile at this time.  Will readdress in the morning.

## 2012-05-24 NOTE — Progress Notes (Signed)
Pt belongings transported to 2111 with family members. Meds tubed to 2100. Spoke with receiving RN, no questions at this time. Levonne Spiller, RN

## 2012-05-24 NOTE — Procedures (Signed)
Intubation Procedure Note [name] [id number] 1945/06/03  Procedure: Intubation Indications: Respiratory insufficiency  Procedure Details Consent: Risks of procedure as well as the alternatives and risks of each were explained to the (patient/caregiver).  Consent for procedure obtained. Time Out: Verified patient identification, verified procedure, site/side was marked, verified correct patient position, special equipment/implants available, medications/allergies/relevent history reviewed, required imaging and test results available.  Performed  Maximum sterile technique was used including gloves, hand hygiene and mask.  MAC and 4    Evaluation Hemodynamic Status: Persistent hypotension treated with pressors; O2 sats: transiently fell during during procedure Patient's Current Condition: unstable Complications: No apparent complications Patient did tolerate procedure well. Chest X-ray ordered to verify placement.  CXR: pending.   Coralyn Helling, MD [facility] Pulmonary/Critical Care 05/24/2012, 12:46 PM Pager:  [phone] After 3pm call: [phone]

## 2012-05-24 NOTE — Progress Notes (Signed)
ANTIBIOTIC CONSULT NOTE   Pharmacy Consult for Vancomycin / Zosyn Indication: rule out sepsis s/p femur nailing  Allergies  Allergen Reactions  . Contrast Media (Iodinated Diagnostic Agents) Itching    Patient Measurements: Height: 5\' 3"  (160 cm) Weight: 167 lb 15.9 oz (76.2 kg) IBW/kg (Calculated) : 52.4   Labs:  Basename 05/24/12 0729 05/23/12 0510 05/22/12 0625  WBC 33.1* 24.5* 24.0*  HGB 8.0* 8.5* 8.7*  PLT 169 145* 133*  LABCREA -- -- --  CREATININE 10.96* -- 5.17*   Estimated Creatinine Clearance: 4.9 ml/min (by C-G formula based on Cr of 10.96). No results found for this basename: VANCOTROUGH:2,VANCOPEAK:2,VANCORANDOM:2,GENTTROUGH:2,GENTPEAK:2,GENTRANDOM:2,TOBRATROUGH:2,TOBRAPEAK:2,TOBRARND:2,AMIKACINPEAK:2,AMIKACINTROU:2,AMIKACIN:2, in the last 72 hours   Microbiology: Recent Results (from the past 720 hour(s))  SURGICAL PCR SCREEN     Status: Normal   Collection Time   05/20/12  1:45 AM      Component Value Range Status Comment   MRSA, PCR NEGATIVE  NEGATIVE Final    Staphylococcus aureus NEGATIVE  NEGATIVE Final   CULTURE, BLOOD (ROUTINE X 2)     Status: Normal (Preliminary result)   Collection Time   05/22/12  9:34 AM      Component Value Range Status Comment   Specimen Description BLOOD HAND RIGHT   Final    Special Requests BOTTLES DRAWN AEROBIC AND ANAEROBIC 10CC EACH   Final    Culture  Setup Time 05/22/2012 18:15   Final    Culture     Final    Value:        BLOOD CULTURE RECEIVED NO GROWTH TO DATE CULTURE WILL BE HELD FOR 5 DAYS BEFORE ISSUING A FINAL NEGATIVE REPORT   Report Status PENDING   Incomplete   CULTURE, BLOOD (ROUTINE X 2)     Status: Normal (Preliminary result)   Collection Time   05/22/12  9:42 AM      Component Value Range Status Comment   Specimen Description BLOOD HAND RIGHT   Final    Special Requests BOTTLES DRAWN AEROBIC AND ANAEROBIC 10CC EACH   Final    Culture  Setup Time 05/22/2012 18:15   Final    Culture     Final    Value:        BLOOD CULTURE RECEIVED NO GROWTH TO DATE CULTURE WILL BE HELD FOR 5 DAYS BEFORE ISSUING A FINAL NEGATIVE REPORT   Report Status PENDING   Incomplete     Medical History: Past Medical History  Diagnosis Date  . Dialysis care     M-W-F dialysis at [facility]  . COPD (chronic obstructive pulmonary disease)   . Diabetes mellitus   . Hypertension   . Thyroid disease   . Hyperlipidemia   . Bronchitis   . Leg pain   . Renal disorder    Assessment: 66 year old female with ESRD (HD MWF) s/p femur fracture nailing 12/22.  Now with ongoing fevers, dyspnea, and increased WBC concerning for sepsis  Blood cultures negative to date   Vancomycin 12/24>>  Adding Zosyn coverage today  Goal of Therapy:  Appropriate vancomycin levels Appropriate Zosyn dosing  Plan:  1) Continue Vancomycin 750 mg IV Q HD (doses given appropriately through Christmas -- given today and scheduled for tomorrow with normal HD session) 2) Add Zosyn 2.25 grams iv Q 8 hours 3) Continue to follow  Thank you. Okey Regal, PharmD [phone]  05/24/2012,11:27 AM

## 2012-05-24 NOTE — Procedures (Signed)
Central Venous Catheter Insertion Procedure Note ZOHAL RENY [id number] 01-27-1946  Procedure: Insertion of hemodialysis cather Indications: Hemodialysis  Procedure Details Consent: Risks of procedure as well as the alternatives and risks of each were explained to the (patient/caregiver).  Consent for procedure obtained. Time Out: Verified patient identification, verified procedure, site/side was marked, verified correct patient position, special equipment/implants available, medications/allergies/relevent history reviewed, required imaging and test results available.  Performed  Maximum sterile technique was used including antiseptics, cap, gloves, gown, hand hygiene, mask and sheet. Skin prep: Chlorhexidine; local anesthetic administered A antimicrobial bonded/coated triple lumen trialysis catheter was placed in the right femoral vein due to patient being a dialysis patient using the Seldinger technique.  Performed with ultrasound guidance.  Evaluation Blood flow good Complications: No apparent complications Patient did tolerate procedure well.  I was present for procedure.  Coralyn Helling, MD [facility] Pulmonary/Critical Care 05/24/2012, 12:31 PM Pager:  [phone] After 3pm call: [phone]

## 2012-05-24 NOTE — Procedures (Signed)
I have seen and examined this patient and agree with the plan of care. Low blood pressure and short of breath. patient now being ultrafiltered. Check CXR doubt that more fluid can be removed.  Allura Doepke W 05/24/2012, 9:16 AM

## 2012-05-25 DIAGNOSIS — A0472 Enterocolitis due to Clostridium difficile, not specified as recurrent: Secondary | ICD-10-CM

## 2012-05-25 DIAGNOSIS — A419 Sepsis, unspecified organism: Secondary | ICD-10-CM

## 2012-05-25 LAB — POCT I-STAT 3, ART BLOOD GAS (G3+)
Patient temperature: 97.6
pH, Arterial: 7.402 (ref 7.350–7.450)

## 2012-05-25 LAB — CBC
MCH: 25.5 pg — ABNORMAL LOW (ref 26.0–34.0)
MCHC: 31.3 g/dL (ref 30.0–36.0)
Platelets: 149 10*3/uL — ABNORMAL LOW (ref 150–400)

## 2012-05-25 LAB — GLUCOSE, CAPILLARY
Glucose-Capillary: 163 mg/dL — ABNORMAL HIGH (ref 70–99)
Glucose-Capillary: 175 mg/dL — ABNORMAL HIGH (ref 70–99)
Glucose-Capillary: 231 mg/dL — ABNORMAL HIGH (ref 70–99)
Glucose-Capillary: 257 mg/dL — ABNORMAL HIGH (ref 70–99)

## 2012-05-25 LAB — BASIC METABOLIC PANEL
BUN: 50 mg/dL — ABNORMAL HIGH (ref 6–23)
Calcium: 8.5 mg/dL (ref 8.4–10.5)
GFR calc non Af Amer: 5 mL/min — ABNORMAL LOW (ref >90)
Glucose, Bld: 189 mg/dL — ABNORMAL HIGH (ref 70–99)

## 2012-05-25 MED ORDER — ACETAMINOPHEN 325 MG PO TABS
650.0000 mg | ORAL_TABLET | Freq: Four times a day (QID) | ORAL | Status: DC | PRN
  Administered 2012-05-25: 650 mg via NASOGASTRIC
  Filled 2012-05-25: qty 2

## 2012-05-25 MED ORDER — INSULIN ASPART 100 UNIT/ML ~~LOC~~ SOLN
0.0000 [IU] | SUBCUTANEOUS | Status: DC
  Administered 2012-05-25: 5 [IU] via SUBCUTANEOUS
  Administered 2012-05-25: 2 [IU] via SUBCUTANEOUS
  Administered 2012-05-25: 5 [IU] via SUBCUTANEOUS
  Administered 2012-05-25: 8 [IU] via SUBCUTANEOUS
  Administered 2012-05-25: 2 [IU] via SUBCUTANEOUS
  Administered 2012-05-25 – 2012-05-26 (×5): 3 [IU] via SUBCUTANEOUS
  Administered 2012-05-27: 5 [IU] via SUBCUTANEOUS
  Administered 2012-05-27: 11 [IU] via SUBCUTANEOUS
  Administered 2012-05-27 (×2): 5 [IU] via SUBCUTANEOUS
  Administered 2012-05-27: 8 [IU] via SUBCUTANEOUS
  Administered 2012-05-27 – 2012-05-28 (×2): 3 [IU] via SUBCUTANEOUS
  Administered 2012-05-28: 2 [IU] via SUBCUTANEOUS
  Administered 2012-05-28: 8 [IU] via SUBCUTANEOUS
  Administered 2012-05-28: 100 [IU] via SUBCUTANEOUS
  Administered 2012-05-28: 2 [IU] via SUBCUTANEOUS
  Administered 2012-05-29: 100 [IU] via SUBCUTANEOUS
  Administered 2012-05-29 (×2): 3 [IU] via SUBCUTANEOUS
  Administered 2012-05-29: 2 [IU] via SUBCUTANEOUS
  Administered 2012-05-29: 5 [IU] via SUBCUTANEOUS
  Administered 2012-05-30: 8 [IU] via SUBCUTANEOUS
  Administered 2012-05-30: 3 [IU] via SUBCUTANEOUS
  Administered 2012-05-30: 2 [IU] via SUBCUTANEOUS
  Administered 2012-05-30: 5 [IU] via SUBCUTANEOUS
  Administered 2012-05-30: 2 [IU] via SUBCUTANEOUS
  Administered 2012-05-30: 5 [IU] via SUBCUTANEOUS
  Administered 2012-05-30: via SUBCUTANEOUS
  Administered 2012-05-31: 11 [IU] via SUBCUTANEOUS
  Administered 2012-05-31 (×2): 3 [IU] via SUBCUTANEOUS
  Administered 2012-05-31: 8 [IU] via SUBCUTANEOUS
  Administered 2012-05-31: 3 [IU] via SUBCUTANEOUS
  Administered 2012-06-01: 2 [IU] via SUBCUTANEOUS
  Administered 2012-06-01 (×2): 3 [IU] via SUBCUTANEOUS
  Administered 2012-06-01: 2 [IU] via SUBCUTANEOUS
  Administered 2012-06-01 – 2012-06-03 (×8): 3 [IU] via SUBCUTANEOUS
  Administered 2012-06-03: 15 [IU] via SUBCUTANEOUS
  Administered 2012-06-03: 3 [IU] via SUBCUTANEOUS
  Administered 2012-06-04: 0 [IU] via SUBCUTANEOUS
  Administered 2012-06-04: 3 [IU] via SUBCUTANEOUS

## 2012-05-25 MED ORDER — WHITE PETROLATUM GEL
Status: AC
  Administered 2012-05-25: 0.2
  Filled 2012-05-25: qty 5

## 2012-05-25 NOTE — Progress Notes (Signed)
Inpatient Diabetes Program Recommendations  AACE/ADA: New Consensus Statement on Inpatient Glycemic Control (2013)  Target Ranges:  Prepandial:   less than 140 mg/dL      Peak postprandial:   less than 180 mg/dL (1-2 hours)      Critically ill patients:  140 - 180 mg/dL   Results for JALENA, VANDERLINDEN (MRN [id number]) as of 05/25/2012 08:20  Ref. Range 05/23/2012 07:14 05/23/2012 11:33 05/23/2012 17:00 05/23/2012 20:41 05/24/2012 12:51 05/24/2012 16:03 05/24/2012 20:17 05/25/2012 00:27 05/25/2012 04:07  Glucose-Capillary Latest Range: 70-99 mg/dL 213 (H) 086 (H) 578 (H) 153 (H) 152 (H) 275 (H) 250 (H) 257 (H) 231 (H)    Inpatient Diabetes Program Recommendations Insulin - Basal: Please consider starting Lantus 10 units QHS.  Note: Please consider restarting basal insulin.  Blood glucose over the past 12 hours consistently greater than 200 mg/dl.  Recommend Lantus 10 units QHS.  Will continue to follow.  Thanks, Orlando Penner, RN, BSN, CCRN Diabetes Coordinator Inpatient Diabetes Program [phone]

## 2012-05-25 NOTE — Progress Notes (Signed)
Clinical Social Work  CSW received handoff from 3W CSW regarding patient. Patient has been faxed out for SNF. Patient extubated. CSW will continue to follow to assist with dc planning.  [place], [place] [phone]

## 2012-05-25 NOTE — Progress Notes (Signed)
eLink Physician-Brief Progress Note Patient Name: [name] DOB: 1946/02/11 MRN: [id number]  Date of Service  05/25/2012   HPI/Events of Note  Call from nurse reporting hyperglycemia in patient with ESRD on sensitive scale consistently running blood sugars greater than 200.   eICU Interventions  Plan: Change q4 hour SSI regimen to moderate scale     Intervention Category Intermediate Interventions: Hyperglycemia - evaluation and treatment  DETERDING,ELIZABETH 05/25/2012, 12:35 AM

## 2012-05-25 NOTE — Procedures (Addendum)
Extubation Procedure Note  Patient Details:   Name: [name] DOB: December 30, 1945 MRN: [id number]       Pt extubated to [facility] after successful SBT.  ABG wnl, Pt able to vocalize and has good cough.  Positive cuff leak noted.  Will continue to monitor.    Evaluation  O2 sats: stable throughout Complications: No apparent complications Patient did tolerate procedure well.  Suctioning: Airway Yes  Hareem Surowiec Apple 05/25/2012, 12:12 PM

## 2012-05-25 NOTE — Progress Notes (Signed)
Notified Dr Deterdine of CBGs in the 250 range and received order to change SSI from sensitive to moderate scale.

## 2012-05-25 NOTE — Progress Notes (Signed)
PT/OT Cancellation Note  Patient Details Name: [name] MRN: [id number] DOB: 28-Aug-1945   Cancelled Treatment:    Reason Eval/Treat Not Completed: Medical issues which prohibited therapy.  Pt currently on Pressors and to be extubated.  Will f/u another time.     Sunny Schlein, [place] [phone] 05/25/2012, 11:37 AM

## 2012-05-25 NOTE — Progress Notes (Signed)
Orthopaedic Trauma Service (OTS)  Subjective: 5 Days Post-Op Procedure(s) (LRB): INTRAMEDULLARY (IM) RETROGRADE FEMORAL NAILING (Left)  Pt now on vent In 2111 Intubated but awake   + C. Diff  Pt following some commands today  Objective: Current Vitals Blood pressure 118/50, pulse 90, temperature 97.6 F (36.4 C), temperature source Oral, resp. rate 18, height 5\' 3"  (1.6 m), weight 78.9 kg (173 lb 15.1 oz), SpO2 99.00%. Vital signs in last 24 hours: Temp:  [97.6 F (36.4 C)-101.7 F (38.7 C)] 97.6 F (36.4 C) (12/27 0815) Pulse Rate:  [32-144] 90  (12/27 0715) Resp:  [14-35] 18  (12/27 0715) BP: (65-164)/(26-83) 118/50 mmHg (12/27 0715) SpO2:  [42 %-100 %] 99 % (12/27 0715) FiO2 (%):  [40 %-100 %] 40 % (12/27 0810) Weight:  [76.2 kg (167 lb 15.9 oz)-78.9 kg (173 lb 15.1 oz)] 78.9 kg (173 lb 15.1 oz) (12/27 0448)   Intake/Output from previous day: 12/26 0701 - 12/27 0700 In: 2653.7 [I.V.:1193.7; NG/GT:600; IV Piggyback:860] Out: 1731 [Emesis/NG output:500; Stool:2] Intake/Output      12/26 0701 - 12/27 0700 12/27 0701 - 12/28 0700   I.V. (mL/kg) 1193.7 (15.1)    NG/GT 600    IV Piggyback 860    Total Intake(mL/kg) 2653.7 (33.6)    Emesis/NG output 500    Other 1229    Stool 2    Total Output 1731    Net +922.7         Stool Occurrence 2 x      LABS  Basename 05/24/12 0729 05/23/12 0510  HGB 8.0* 8.5*    Basename 05/24/12 0729 05/23/12 0510  WBC 33.1* 24.5*  RBC 3.16* 3.31*  HCT 25.9* 26.9*  PLT 169 145*    Basename 05/24/12 1530 05/24/12 0729  NA 139 139  K 4.4 4.7  CL 93* 94*  CO2 16* 21  BUN 32* 66*  CREATININE 5.67* 10.96*  GLUCOSE 256* 220*  CALCIUM 8.3* 8.3*   No results found for this basename: LABPT:2,INR:2 in the last 72 hours    Physical Exam  Gen: intubated but awake, following some commands this am Ext:      Left Lower Extremity  Incisions look pristine  No significant knee swelling  Pt with blister to anterior lower  leg  Swelling stable  Able to move toes today  Ext warm   + DP pulse   Imaging Ct Abdomen Pelvis Wo Contrast  05/24/2012  *RADIOLOGY REPORT*  Clinical Data: Rectal bleeding, recent abnormal CT with sigmoid wall thickening, new sepsis  CT ABDOMEN AND PELVIS WITHOUT CONTRAST  Technique:  Multidetector CT imaging of the abdomen and pelvis was performed following the standard protocol without intravenous contrast. Small amount of GI contrast is present.  Sagittal and coronal MPR images reconstructed from axial data set.  Comparison: 05/17/2012  Findings: Mild atelectasis left lung base. Right femoral line, tip IVC. Nasogastric tube in stomach. Minimal pericardial effusion Slightly increased. Streak artifacts from the patient's arms traverse the upper abdomen. Low to intermediate attenuation lesion within the anterior 5.1 x 4.0 cm image 30.  Within limits of a nonenhanced exam, no focal abnormalities of the liver, spleen, pancreas, kidneys or adrenal glands otherwise identified. Atherosclerotic calcifications without aneurysm. Large and small bowel loops are underdistended making assessment of wall thickness suboptimal. However there appears to be diffuse wall thickening of the colon with adjacent pericolic infiltration and multiple sites question colitis. Minimal sigmoid diverticulosis incidentally noted.  Bladder partially distended, cystocele noted. 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 Scattered normal-sized mesenteric nodes medial to right colon. No additional mass, adenopathy, free fluid, or free air. Bones appear diffusely demineralized.  IMPRESSION: Probable diffuse colitis; differential diagnosis includes infection, inflammatory bowel disease, and ischemia; when I called results to the patient's nurse, he indicated a new positive laboratory test for Clostridium difficile, which would account for this finding. Minimal pericardial effusion, increased. Remainder of exam unchanged from prior recent  CT.  Findings called to [facility] on 2100 on 05/24/2012 at 1653 hours.   Original Report Authenticated By: Ulyses Southward, M.D.    Dg Chest Port 1 View  05/24/2012  *RADIOLOGY REPORT*  Clinical Data: Evaluate nasogastric tube and endotracheal tube placements  PORTABLE CHEST - 1 VIEW  Comparison: Portable exam 1303 hours compared to 05/24/2012  Findings: Tip of endotracheal tube 1.5 cm above carina. Left jugular dual-lumen large-bore central venous catheter with tip projecting over the cavoatrial junction. Nasogastric tube extends into stomach. Upper normal-size of cardiac silhouette. Atherosclerotic calcification aorta. Mediastinal contours and pulmonary vascularity normal. No definite infiltrate, pleural effusion or pneumothorax. No acute osseous findings.  IMPRESSION: No acute abnormalities. Line and tube positions as above.   Original Report Authenticated By: Ulyses Southward, M.D.    Dg Chest Port 1 View  05/24/2012  **ADDENDUM** CREATED: 05/24/2012 10:14:16  Comparison exams from 05/19/2012 and 02/10/2011 are available.  Hilar prominence unchanged.  Pulmonary vascular congestion/mild pulmonary edema slightly more notable than on the prior exams.  **END ADDENDUM** SIGNED BY: Almedia Balls. Constance Goltz, M.D.   05/24/2012  *RADIOLOGY REPORT*  Clinical Data: Increase shortness of breath.  PORTABLE CHEST - 1 VIEW  Comparison: Currently, there are PACS problems and no comparison exams are available.  Findings: Left central line tip in the region of the distal superior vena cava/cavoatrial junction.  No gross pneumothorax.  Mild pulmonary edema.  Hilar prominence may be vascular in origin.  Stability can be confirmed on follow-up.  Heart size top normal.  Calcified slightly tortuous aorta.  No segmental consolidation.  IMPRESSION: No comparisons presently available.  Mild pulmonary edema.  Please see above.   Original Report Authenticated By: Lacy Duverney, M.D.    Dg Chest [facility]  05/24/2012  Garnetta Buddy, MD      05/24/2012  9:17 AM I have seen and examined this patient and agree with the plan of  care. Low blood pressure and short of breath. patient now being  ultrafiltered. Check CXR doubt that more fluid can be removed.  WEBB,MARTIN W 05/24/2012, 9:16 AM      Assessment/Plan: 5 Days Post-Op Procedure(s) (LRB): INTRAMEDULLARY (IM) RETROGRADE FEMORAL NAILING (Left)  66 y/o female s/p fall  1. Fall 2. L supracondylar distal femur fracture POD 5  NWB  Unrestricted L knee ROM     Resume therapies when able to do so  No pillows directly under knee, float/elevate leg by placing pillows under ankle  Float heels off bed, pt at risk for pressure ulcers  Dressing changes prn  No splinting or bracing necessary   Dressing to blister anterior tibia 3. Sepsis/ C.diff colitis  Per critical care  Concern for infected perm cath, plan to remove today 4. ESRD  Per nephrology 5. ABL anemia  Stable  Pt is a Jehov[employer]'s Witness 6. Dvt/pe prophylaxis  Holding pharmacologics due to + FOB on 12/19  SCD's 7. Dispo  Continue with ICU care  Will resume therapies when medically stable  if need assistance call weekend coverage   Call  [phone] and they will put you through   Mearl Latin, PA-C [facility] [phone] (P) 05/25/2012, 10:04 AM

## 2012-05-25 NOTE — Progress Notes (Signed)
PULMONARY  / CRITICAL CARE MEDICINE  Name: [name] MRN: [id number] DOB: Jun 10, 1945    LOS: 6  REFERRING MD :  [name] [facility])  CHIEF COMPLAINT:  Sepsis  BRIEF PATIENT DESCRIPTION: 66 y/o F admitted with L hip fracture s/p nailing 12/22.  ESRD withr ecent Perm Cath exchange 12/19.  12/26 noted to have fever of 103, tachycardia, dyspnea and change in mental status concerning for sepsis.  PCCM consulted for sepsis.  Found to have c diff colitis & septic shock requiring mechanical ventialtion   PMH:   recent fall with L Hip fracture s/p intramedullary nailing of L femur on 12/22 [facility]), ESRD on HD, ER visit for rectal bleeding 12/19  LINES / TUBES: 12/19 IJ Tunneled Cath (exchanged)>>>>  CULTURES: 12/24 MRSA PCR>>>neg 12/24 BCx2>>> 12/26 BCx2>>> 12/26 C diff PCR >>>POS   ANTIBIOTICS: 12/25 Vanco>>> 12/26 PO vanc >> 12/26  IV flagyl >> 12/26 Zosyn>>>   SIGNIFICANT EVENTS:  12/19 - Admit post fall with L hip fracture 12/22 - L hip repair with intramedullary nailing of L femur (Dr. Carola [name])  LEVEL OF CARE:  ICU PRIMARY SERVICE:  TRH -->PCCM CONSULTANTS:  [name] CODE STATUS: Full Code DIET:  NPO DVT Px: SCD's GI Px:  None indicated  HISTORY OF PRESENT ILLNESS:  66 y/o F with PMH of ESRD on HD, DM, HTN, Thyroid disease,  recent ER visit for rectal bleeding 12/19 admitted with L hip fracture s/p nailing 12/22.  Recent [employer] 12/19 over a wire per IR.  12/26 noted to have fever of 103, tachycardia, dyspnea and change in mental status concerning for sepsis.     INTERVAL HISTORY: Elderly female, awake, lying on stretcher, tachypnea noted  VITAL SIGNS: Temp:  [98.6 F (37 C)-101.7 F (38.7 C)] 98.6 F (37 C) (12/27 0408) Pulse Rate:  [32-144] 90  (12/27 0715) Resp:  [14-35] 18  (12/27 0715) BP: (65-164)/(26-83) 118/50 mmHg (12/27 0715) SpO2:  [42 %-100 %] 99 % (12/27 0715) FiO2 (%):  [40 %-100 %] 40 % (12/27 0810) Weight:  [76.2 kg (167 lb 15.9  oz)-78.9 kg (173 lb 15.1 oz)] 78.9 kg (173 lb 15.1 oz) (12/27 0448)  HEMODYNAMICS:    VENTILATOR SETTINGS: Vent Mode:  [-] PRVC FiO2 (%):  [40 %-100 %] 40 % Set Rate:  [18 bmp] 18 bmp Vt Set:  [440 mL] 440 mL PEEP:  [5 cmH20] 5 cmH20 Plateau Pressure:  [14 cmH20-18 cmH20] 14 cmH20  INTAKE / OUTPUT: Intake/Output      12/26 0701 - 12/27 0700 12/27 0701 - 12/28 0700   I.V. (mL/kg) 1193.7 (15.1)    NG/GT 600    IV Piggyback 860    Total Intake(mL/kg) 2653.7 (33.6)    Emesis/NG output 500    Other 1229    Stool 2    Total Output 1731    Net +922.7         Stool Occurrence 2 x      PHYSICAL EXAMINATION: General: chronically ill female, mild respiratory distress Neuro:  Awakens, follows commands, interactive HEENT:  Mm pink/dry, no jvd Cardiovascular:  s1s2 rrr, no m/r/g Lungs:  Mild accessory muscle use, lungs bilaterally coarse Abdomen:  Round/soft, bsx4 hypoactive Musculoskeletal:  No acute deformities, L hip dressing c/d/i, L ankle wrapped with ace wrap Skin:  Warm/dry, no edema   LABS: Cbc  Lab 05/24/12 0729 05/23/12 0510 05/22/12 0625  WBC 33.1* -- --  HGB 8.0* 8.5* 8.7*  HCT 25.9* 26.9* 28.4*  PLT 169 145* 133*   Chemistry  Lab 05/24/12 1530 05/24/12 0729 05/22/12 0625 05/21/12 1020  NA 139 139 136 --  K 4.4 4.7 4.0 --  CL 93* 94* 91* --  CO2 16* 21 23 --  BUN 32* 66* 23 --  CREATININE 5.67* 10.96* 5.17* --  CALCIUM 8.3* 8.3* 8.3* --  MG -- -- -- --  PHOS -- 6.6* -- 6.2*  GLUCOSE 256* 220* 171* --   Liver fxn  Lab 05/24/12 0729 05/21/12 1020 05/19/12 1746  AST -- -- 19  ALT -- -- 13  ALKPHOS -- -- 165*  BILITOT -- -- 0.3  PROT -- -- 7.0  ALBUMIN 2.1* 2.6* 2.8*   coags  Lab 05/20/12 0028 05/19/12 1746  APTT -- --  INR 1.04 1.06   Sepsis markers  Lab 05/25/12 0447 05/24/12 1538 05/24/12 1530  LATICACIDVEN -- 1.1 --  PROCALCITON 17.02 -- 12.17   Cardiac markers No results found for this basename: CKTOTAL:3,CKMB:3,TROPONINI:3 in the  last 168 hours  BNP No results found for this basename: PROBNP:3 in the last 168 hours  ABG  Lab 05/24/12 1509 05/24/12 1030  PHART 7.340* 7.412  PCO2ART 42.7 34.4*  PO2ART 325.0* 197.0*  HCO3 22.7 21.5  TCO2 24 22.5    CBG trend  Lab 05/25/12 0407 05/25/12 0027 05/24/12 2017 05/24/12 1603 05/24/12 1251  GLUCAP 231* 257* 250* 275* 152*    IMAGING: 12/19 CT ABD / PELVIS>>> ? Early appendicitis, diverticulosis of sigmoid colon without evidence of diverticulitis, renal cysts noted 12/26 CXR>>>pulmonary vascular congestion  ECG: 12/22 - reviewed, NSR  DIAGNOSES: Principal Problem:  *Acute respiratory failure Active Problems:  ANEMIA OF RENAL FAILURE  HYPERTENSION  RENAL DISEASE, CHRONIC, STAGE V  Femur fracture, left  Fall at home  Fever  Severe sepsis  Sepsis with metabolic encephalopathy  Clostridium difficile colitis  Septic shock(785.52)   ASSESSMENT / PLAN:  PULMONARY  ASSESSMENT: Acute Respiratory failure - in setting of sepsis ? Hx COPD - no evidence of acute flare  PLAN:   -O2 to support sats > 90% -SBts with goal extubate -defer VQ scan , recent L hip surgery, though low suspicion   CARDIOVASCULAR  ASSESSMENT:  Septic shock Sinus Tachycardia  - in setting of sepsis, concern for infected perm cath. Hypotension  - mild, sbp 90's Hx HTN  PLAN:  -may need additional volume -no need for stress steroids - cortisol 33  RENAL  ASSESSMENT:   Chronic Renal Failure  - HD M/W/F  PLAN:   -HD per Nephrology, hope to be off pressors in 24h, defer CVVH -placed temporary HD catheter, remove Perm Cath (discussed with [name]) -  GASTROINTESTINAL  ASSESSMENT:   Rectal Bleeding - noted ER visit 12/19 with positive FOB, none noted this admit.  H/H stable  PLAN:   -monitor h/h -hold heparin products -pepcid, avoid PPI  HEMATOLOGIC  ASSESSMENT:   Anemia - in setting of chronic disease  PLAN:  -no indication for transfusion at this  time -f/u BMP  INFECTIOUS  ASSESSMENT:   Severe Sepsis - concern for line (perm cath) related sepsis.  Noted spikes of fever during HD, rising WBC.  Line recently changed over wire 12/19.   ? Appendicitis/sigmoid colon thickening  - noted on CT scan previous visit with rectal bleeding, rule out abdominal source c diff colitis -diffuse colitis on  abd / pelvis CT    PLAN:   -vanco -fu  cultures    ENDOCRINE  ASSESSMENT:  DM  PLAN:   -SSI  NEUROLOGIC  ASSESSMENT:   Acute Encephalopathy  - in setting of severe sepsis  PLAN:   -supportive care, correct underlying cause -monitor mental status    I have personally obtained a history, examined the patient, evaluated laboratory and imaging results, formulated the assessment and plan and placed orders.  CRITICAL CARE: The patient is critically ill with multiple organ systems failure and requires high complexity decision making for assessment and support, frequent evaluation and titration of therapies, application of advanced monitoring technologies and extensive interpretation of multiple databases. Critical Care Time devoted to patient care services described in this note is 40 minutes.   Cyril Mourning MD. Tonny Bollman. [facility] Pager [phone] If no response call [phone]      05/25/2012, 8:54 AM

## 2012-05-25 NOTE — Progress Notes (Signed)
[name] ROUNDING NOTE   Subjective:   Interval History: now intubated and requiring pressors  Objective:  Vital signs in last 24 hours:  Temp:  [98.6 F (37 C)-101.7 F (38.7 C)] 98.6 F (37 C) (12/27 0408) Pulse Rate:  [32-144] 90  (12/27 0715) Resp:  [14-35] 18  (12/27 0715) BP: (65-164)/(26-83) 118/50 mmHg (12/27 0715) SpO2:  [42 %-100 %] 99 % (12/27 0715) FiO2 (%):  [40 %-100 %] 40 % (12/27 0810) Weight:  [76.2 kg (167 lb 15.9 oz)-78.9 kg (173 lb 15.1 oz)] 78.9 kg (173 lb 15.1 oz) (12/27 0448)  Weight change: 0.54 kg (1 lb 3.1 oz) Filed Weights   05/24/12 0700 05/24/12 1014 05/25/12 0448  Weight: 76.1 kg (167 lb 12.3 oz) 76.2 kg (167 lb 15.9 oz) 78.9 kg (173 lb 15.1 oz)    Intake/Output: I/O last 3 completed shifts: In: 2653.7 [I.V.:1193.7; NG/GT:600; IV Piggyback:860] Out: 1731 [Emesis/NG output:500; ZOXWR:6045; Stool:2]   Intake/Output this shift:    Awake and alert CVS- RRR RS- CTA intubated clear breath sounds ABD- hypoactive EXT- no edema   Basic Metabolic Panel:  Lab 05/24/12 4098 05/24/12 0729 05/22/12 0625 05/21/12 1025 05/21/12 1020  NA 139 139 136 133* 133*  K 4.4 4.7 4.0 4.4 4.3  CL 93* 94* 91* 91* 90*  CO2 16* 21 23 22 21   GLUCOSE 256* 220* 171* 294* 287*  BUN 32* 66* 23 62* 62*  CREATININE 5.67* 10.96* 5.17* 10.77* 10.69*  CALCIUM 8.3* 8.3* 8.3* -- --  MG -- -- -- -- --  PHOS -- 6.6* -- -- 6.2*    Liver Function Tests:  Lab 05/24/12 0729 05/21/12 1020 05/19/12 1746  AST -- -- 19  ALT -- -- 13  ALKPHOS -- -- 165*  BILITOT -- -- 0.3  PROT -- -- 7.0  ALBUMIN 2.1* 2.6* 2.8*   No results found for this basename: LIPASE:5,AMYLASE:5 in the last 168 hours No results found for this basename: AMMONIA:3 in the last 168 hours  CBC:  Lab 05/24/12 0729 05/23/12 0510 05/22/12 0625 05/21/12 1025 05/20/12 0520 05/19/12 1746  WBC 33.1* 24.5* 24.0* 23.0* 22.0* --  NEUTROABS 30.4* -- -- -- -- 16.7*  HGB 8.0* 8.5* 8.7* 8.9* 9.9*  --  HCT 25.9* 26.9* 28.4* 27.8* 33.0* --  MCV 82.0 81.3 82.1 80.6 83.1 --  PLT 169 145* 133* 117* 114* --    Cardiac Enzymes: No results found for this basename: CKTOTAL:5,CKMB:5,CKMBINDEX:5,TROPONINI:5 in the last 168 hours  BNP: No components found with this basename: POCBNP:5  CBG:  Lab 05/25/12 0407 05/25/12 0027 05/24/12 2017 05/24/12 1603 05/24/12 1251  GLUCAP 231* 257* 250* 275* 152*    Microbiology: Results for orders placed during the hospital encounter of 05/19/12  SURGICAL PCR SCREEN     Status: Normal   Collection Time   05/20/12  1:45 AM      Component Value Range Status Comment   MRSA, PCR NEGATIVE  NEGATIVE Final    Staphylococcus aureus NEGATIVE  NEGATIVE Final   CULTURE, BLOOD (ROUTINE X 2)     Status: Normal (Preliminary result)   Collection Time   05/22/12  9:34 AM      Component Value Range Status Comment   Specimen Description BLOOD HAND RIGHT   Final    Special Requests BOTTLES DRAWN AEROBIC AND ANAEROBIC 10CC EACH   Final    Culture  Setup Time 05/22/2012 18:15   Final    Culture     Final  Value:        BLOOD CULTURE RECEIVED NO GROWTH TO DATE CULTURE WILL BE HELD FOR 5 DAYS BEFORE ISSUING A FINAL NEGATIVE REPORT   Report Status PENDING   Incomplete   CULTURE, BLOOD (ROUTINE X 2)     Status: Normal (Preliminary result)   Collection Time   05/22/12  9:42 AM      Component Value Range Status Comment   Specimen Description BLOOD HAND RIGHT   Final    Special Requests BOTTLES DRAWN AEROBIC AND ANAEROBIC 10CC EACH   Final    Culture  Setup Time 05/22/2012 18:15   Final    Culture     Final    Value:        BLOOD CULTURE RECEIVED NO GROWTH TO DATE CULTURE WILL BE HELD FOR 5 DAYS BEFORE ISSUING A FINAL NEGATIVE REPORT   Report Status PENDING   Incomplete   MRSA PCR SCREENING     Status: Normal   Collection Time   05/24/12  2:50 PM      Component Value Range Status Comment   MRSA by PCR NEGATIVE  NEGATIVE Final   CLOSTRIDIUM DIFFICILE BY PCR      Status: Abnormal   Collection Time   05/24/12  2:53 PM      Component Value Range Status Comment   C difficile by pcr POSITIVE (*) NEGATIVE Final     Coagulation Studies: No results found for this basename: LABPROT:5,INR:5 in the last 72 hours  Urinalysis: No results found for this basename: COLORURINE:2,APPERANCEUR:2,LABSPEC:2,PHURINE:2,GLUCOSEU:2,HGBUR:2,BILIRUBINUR:2,KETONESUR:2,PROTEINUR:2,UROBILINOGEN:2,NITRITE:2,LEUKOCYTESUR:2 in the last 72 hours    Imaging: Ct Abdomen Pelvis Wo Contrast  05/24/2012  *RADIOLOGY REPORT*  Clinical Data: Rectal bleeding, recent abnormal CT with sigmoid wall thickening, new sepsis  CT ABDOMEN AND PELVIS WITHOUT CONTRAST  Technique:  Multidetector CT imaging of the abdomen and pelvis was performed following the standard protocol without intravenous contrast. Small amount of GI contrast is present.  Sagittal and coronal MPR images reconstructed from axial data set.  Comparison: 05/17/2012  Findings: Mild atelectasis left lung base. Right femoral line, tip IVC. Nasogastric tube in stomach. Minimal pericardial effusion Slightly increased. Streak artifacts from the patient's arms traverse the upper abdomen. Low to intermediate attenuation lesion within the anterior 5.1 x 4.0 cm image 30.  Within limits of a nonenhanced exam, no focal abnormalities of the liver, spleen, pancreas, kidneys or adrenal glands otherwise identified. Atherosclerotic calcifications without aneurysm. Large and small bowel loops are underdistended making assessment of wall thickness suboptimal. However there appears to be diffuse wall thickening of the colon with adjacent pericolic infiltration and multiple sites question colitis. Minimal sigmoid diverticulosis incidentally noted.  Bladder partially distended, cystocele noted. Probable 10 mm uterine leiomyoma at fundus. Scattered normal-sized mesenteric nodes medial to right colon. No additional mass, adenopathy, free fluid, or free air. Bones  appear diffusely demineralized.  IMPRESSION: Probable diffuse colitis; differential diagnosis includes infection, inflammatory bowel disease, and ischemia; when I called results to the patient's nurse, he indicated a new positive laboratory test for Clostridium difficile, which would account for this finding. Minimal pericardial effusion, increased. Remainder of exam unchanged from prior recent CT.  Findings called to [facility] on 2100 on 05/24/2012 at 1653 hours.   Original Report Authenticated By: Ulyses Southward, M.D.    Dg Chest Port 1 View  05/24/2012  *RADIOLOGY REPORT*  Clinical Data: Evaluate nasogastric tube and endotracheal tube placements  PORTABLE CHEST - 1 VIEW  Comparison: Portable exam 1303 hours compared to 05/24/2012  Findings: Tip of endotracheal tube 1.5 cm above carina. Left jugular dual-lumen large-bore central venous catheter with tip projecting over the cavoatrial junction. Nasogastric tube extends into stomach. Upper normal-size of cardiac silhouette. Atherosclerotic calcification aorta. Mediastinal contours and pulmonary vascularity normal. No definite infiltrate, pleural effusion or pneumothorax. No acute osseous findings.  IMPRESSION: No acute abnormalities. Line and tube positions as above.   Original Report Authenticated By: Ulyses Southward, M.D.    Dg Chest Port 1 View  05/24/2012  **ADDENDUM** CREATED: 05/24/2012 10:14:16  Comparison exams from 05/19/2012 and 02/10/2011 are available.  Hilar prominence unchanged.  Pulmonary vascular congestion/mild pulmonary edema slightly more notable than on the prior exams.  **END ADDENDUM** SIGNED BY: Almedia Balls. Constance Goltz, M.D.   05/24/2012  *RADIOLOGY REPORT*  Clinical Data: Increase shortness of breath.  PORTABLE CHEST - 1 VIEW  Comparison: Currently, there are PACS problems and no comparison exams are available.  Findings: Left central line tip in the region of the distal superior vena cava/cavoatrial junction.  No gross pneumothorax.  Mild pulmonary  edema.  Hilar prominence may be vascular in origin.  Stability can be confirmed on follow-up.  Heart size top normal.  Calcified slightly tortuous aorta.  No segmental consolidation.  IMPRESSION: No comparisons presently available.  Mild pulmonary edema.  Please see above.   Original Report Authenticated By: Lacy Duverney, M.D.    Dg Chest [facility]  05/24/2012  Garnetta Buddy, MD     05/24/2012  9:17 AM I have seen and examined this patient and agree with the plan of  care. Low blood pressure and short of breath. patient now being  ultrafiltered. Check CXR doubt that more fluid can be removed.  Kostantinos Tallman W 05/24/2012, 9:16 AM       Medications:      . sodium chloride 20 mL/hr at 05/24/12 1731  . fentaNYL infusion INTRAVENOUS 150 mcg/hr (05/25/12 0215)  . norepinephrine (LEVOPHED) Adult infusion 14 mcg/min (05/25/12 0639)  . vasopressin (PITRESSIN) infusion - *FOR SHOCK* 0.03 Units/min (05/24/12 1751)      . antiseptic oral rinse  15 mL Mouth Rinse QID  . chlorhexidine  15 mL Mouth Rinse BID  . darbepoetin (ARANESP) injection - DIALYSIS  100 mcg Intravenous Q Mon-HD  . famotidine (PEPCID) IV  20 mg Intravenous Q24H  . ferric gluconate (FERRLECIT/NULECIT) IV  125 mg Intravenous Q M,W,F-HD  . insulin aspart  0-15 Units Subcutaneous Q4H  . levothyroxine  75 mcg Oral QAC breakfast  . vancomycin  500 mg Per Tube Q6H   And  . metronidazole  500 mg Intravenous Q8H  . multivitamin  1 tablet Oral Daily  . niacin  500 mg Oral Daily  . paricalcitol  1 mcg Intravenous 3 times weekly  . piperacillin-tazobactam (ZOSYN)  IV  2.25 g Intravenous Q8H  . polyethylene glycol  17 g Oral Daily  . sodium chloride  250 mL Intravenous Once  . vancomycin  750 mg Intravenous Q M,W,F-HD   sodium chloride, sodium chloride, acetaminophen, feeding supplement (NEPRO CARB STEADY), fentaNYL, heparin, lidocaine, lidocaine-prilocaine, menthol-cetylpyridinium, methocarbamol, metoCLOPramide, midazolam, ondansetron,  oxyCODONE, pentafluoroprop-tetrafluoroeth, phenol, sorbitol  Assessment/ Plan:   ESRD- status post dialysis yesterday usually MWF  ANEMIA-stable ESA ansd Iron  MBD-stable  HTN/VOL-clear not much edema on chest x ray hypotensive on dialysis now requiring pressors  ACCESS-IJ to be removed today. Has temporary groin.  CT scan abdomen shows colitis with posive CDiff       LOS: 6 Alexiana Laverdure W @TODAY @8 :34 AM

## 2012-05-26 LAB — BASIC METABOLIC PANEL
GFR calc non Af Amer: 4 mL/min — ABNORMAL LOW (ref >90)
Glucose, Bld: 167 mg/dL — ABNORMAL HIGH (ref 70–99)
Potassium: 3.5 mEq/L (ref 3.5–5.1)
Sodium: 137 mEq/L (ref 135–145)

## 2012-05-26 LAB — VITAMIN D 1,25 DIHYDROXY
Vitamin D 1, 25 (OH)2 Total: <8 pg/mL — ABNORMAL LOW (ref 18–72)
Vitamin D2 1, 25 (OH)2: <8 pg/mL
Vitamin D3 1, 25 (OH)2: <8 pg/mL

## 2012-05-26 LAB — DIFFERENTIAL
Basophils Relative: 0 % (ref 0–1)
Eosinophils Relative: 0 % (ref 0–5)
Lymphocytes Relative: 5 % — ABNORMAL LOW (ref 12–46)
Monocytes Absolute: 1.6 10*3/uL — ABNORMAL HIGH (ref 0.1–1.0)
Monocytes Relative: 6 % (ref 3–12)
Neutrophils Relative %: 89 % — ABNORMAL HIGH (ref 43–77)

## 2012-05-26 LAB — GLUCOSE, CAPILLARY
Glucose-Capillary: 108 mg/dL — ABNORMAL HIGH (ref 70–99)
Glucose-Capillary: 111 mg/dL — ABNORMAL HIGH (ref 70–99)
Glucose-Capillary: 158 mg/dL — ABNORMAL HIGH (ref 70–99)
Glucose-Capillary: 171 mg/dL — ABNORMAL HIGH (ref 70–99)

## 2012-05-26 LAB — CBC
Hemoglobin: 7.2 g/dL — ABNORMAL LOW (ref 12.0–15.0)
MCH: 25.7 pg — ABNORMAL LOW (ref 26.0–34.0)
RBC: 2.8 MIL/uL — ABNORMAL LOW (ref 3.87–5.11)
WBC: 26.5 10*3/uL — ABNORMAL HIGH (ref 4.0–10.5)

## 2012-05-26 MED ORDER — ALTEPLASE 2 MG IJ SOLR
2.0000 mg | Freq: Once | INTRAMUSCULAR | Status: DC | PRN
  Filled 2012-05-26: qty 2

## 2012-05-26 MED ORDER — ACETAMINOPHEN 325 MG PO TABS
650.0000 mg | ORAL_TABLET | Freq: Four times a day (QID) | ORAL | Status: DC | PRN
  Administered 2012-05-29: 650 mg via ORAL

## 2012-05-26 MED ORDER — SODIUM CHLORIDE 0.9 % IV SOLN: 100.0000 mL | INTRAVENOUS | Status: DC | PRN

## 2012-05-26 MED ORDER — NEPRO/CARBSTEADY PO LIQD: 237.0000 mL | ORAL | Status: DC | PRN

## 2012-05-26 MED ORDER — VANCOMYCIN 50 MG/ML ORAL SOLUTION
500.0000 mg | Freq: Four times a day (QID) | ORAL | Status: DC
  Administered 2012-05-27 – 2012-06-04 (×33): 500 mg via ORAL
  Filled 2012-05-26 (×40): qty 10

## 2012-05-26 MED ORDER — HEPARIN SODIUM (PORCINE) 1000 UNIT/ML DIALYSIS: 1000.0000 [IU] | INTRAMUSCULAR | Status: DC | PRN

## 2012-05-26 MED ORDER — PENTAFLUOROPROP-TETRAFLUOROETH EX AERO: 1.0000 "application " | INHALATION_SPRAY | CUTANEOUS | Status: DC | PRN

## 2012-05-26 MED ORDER — LIDOCAINE HCL (PF) 1 % IJ SOLN: 5.0000 mL | INTRAMUSCULAR | Status: DC | PRN

## 2012-05-26 MED ORDER — LIDOCAINE-PRILOCAINE 2.5-2.5 % EX CREA: 1.0000 "application " | TOPICAL_CREAM | CUTANEOUS | Status: DC | PRN

## 2012-05-26 MED ORDER — PARICALCITOL 5 MCG/ML IV SOLN
INTRAVENOUS | Status: AC
  Administered 2012-05-26: 1 ug via INTRAVENOUS
  Filled 2012-05-26: qty 1

## 2012-05-26 MED ORDER — METRONIDAZOLE IN NACL 5-0.79 MG/ML-% IV SOLN
500.0000 mg | Freq: Three times a day (TID) | INTRAVENOUS | Status: DC
  Administered 2012-05-26 – 2012-05-27 (×2): 500 mg via INTRAVENOUS
  Filled 2012-05-26 (×4): qty 100

## 2012-05-26 MED ORDER — VANCOMYCIN HCL 1000 MG IV SOLR
750.0000 mg | INTRAVENOUS | Status: AC
  Administered 2012-05-26: 750 mg via INTRAVENOUS
  Filled 2012-05-26 (×2): qty 750

## 2012-05-26 NOTE — Progress Notes (Signed)
[facility] ADULT ICU REPLACEMENT PROTOCOL FOR AM LAB REPLACEMENT ONLY  The patient does not apply for the [facility] Adult ICU Electrolyte Replacment Protocol based on the criteria listed below:   1. Is GFR >/= 50 ml/min? no  Patient's GFR today is 5 ` 2. Is BUN < 30 mg/dL? no  Patient's BUN today is 57 3. Abnormal electrolyte(s): K-3.5 Physician:  Dr Everlene Other 05/26/2012 4:52 AM

## 2012-05-26 NOTE — Progress Notes (Signed)
[name] ROUNDING NOTE   Subjective:   Interval History:extubated and awake and alert  Objective:  Vital signs in last 24 hours:  Temp:  [97.6 F (36.4 C)-98.3 F (36.8 C)] 98.3 F (36.8 C) (12/28 0350) Pulse Rate:  [34-140] 85  (12/28 0545) Resp:  [10-29] 20  (12/28 0630) BP: (50-146)/(33-107) 121/44 mmHg (12/28 0630) SpO2:  [0 %-100 %] 99 % (12/28 0545) FiO2 (%):  [40 %] 40 % (12/27 0810) Weight:  [81.1 kg (178 lb 12.7 oz)] 81.1 kg (178 lb 12.7 oz) (12/28 0500)  Weight change: 4.9 kg (10 lb 12.8 oz) Filed Weights   05/24/12 1014 05/25/12 0448 05/26/12 0500  Weight: 76.2 kg (167 lb 15.9 oz) 78.9 kg (173 lb 15.1 oz) 81.1 kg (178 lb 12.7 oz)    Intake/Output: I/O last 3 completed shifts: In: 3642.5 [P.O.:240; I.V.:1542.5; NG/GT:750; IV Piggyback:1110] Out: 1731 [Emesis/NG output:500; UJWJX:9147; Stool:2]   Intake/Output this shift:  Total I/O In: 546.5 [P.O.:240; I.V.:206.5; IV Piggyback:100] Out: 0  Awake and alert CVS- RRR RS- CTA basal rales ABD- BS present soft non-distended EXT- no edema   Basic Metabolic Panel:  Lab 05/26/12 8295 05/25/12 1125 05/24/12 1530 05/24/12 0729 05/22/12 0625 05/21/12 1020  NA 137 140 139 139 136 --  K 3.5 3.9 4.4 4.7 4.0 --  CL 96 99 93* 94* 91* --  CO2 18* 21 16* 21 23 --  GLUCOSE 167* 189* 256* 220* 171* --  BUN 57* 50* 32* 66* 23 --  CREATININE 8.31* 7.23* 5.67* 10.96* 5.17* --  CALCIUM 8.1* 8.5 8.3* -- -- --  MG -- -- -- -- -- --  PHOS -- -- -- 6.6* -- 6.2*    Liver Function Tests:  Lab 05/24/12 0729 05/21/12 1020 05/19/12 1746  AST -- -- 19  ALT -- -- 13  ALKPHOS -- -- 165*  BILITOT -- -- 0.3  PROT -- -- 7.0  ALBUMIN 2.1* 2.6* 2.8*   No results found for this basename: LIPASE:5,AMYLASE:5 in the last 168 hours No results found for this basename: AMMONIA:3 in the last 168 hours  CBC:  Lab 05/26/12 0400 05/25/12 1125 05/24/12 0729 05/23/12 0510 05/22/12 0625 05/19/12 1746  WBC 26.5* 26.6*  33.1* 24.5* 24.0* --  NEUTROABS 23.6* -- 30.4* -- -- 16.7*  HGB 7.2* 7.1* 8.0* 8.5* 8.7* --  HCT 23.1* 22.7* 25.9* 26.9* 28.4* --  MCV 82.5 81.7 82.0 81.3 82.1 --  PLT 124* 149* 169 145* 133* --    Cardiac Enzymes: No results found for this basename: CKTOTAL:5,CKMB:5,CKMBINDEX:5,TROPONINI:5 in the last 168 hours  BNP: No components found with this basename: POCBNP:5  CBG:  Lab 05/26/12 0349 05/26/12 0024 05/25/12 2021 05/25/12 1631 05/25/12 1205  GLUCAP 161* 158* 163* 147* 175*    Microbiology: Results for orders placed during the hospital encounter of 05/19/12  SURGICAL PCR SCREEN     Status: Normal   Collection Time   05/20/12  1:45 AM      Component Value Range Status Comment   MRSA, PCR NEGATIVE  NEGATIVE Final    Staphylococcus aureus NEGATIVE  NEGATIVE Final   CULTURE, BLOOD (ROUTINE X 2)     Status: Normal (Preliminary result)   Collection Time   05/22/12  9:34 AM      Component Value Range Status Comment   Specimen Description BLOOD HAND RIGHT   Final    Special Requests BOTTLES DRAWN AEROBIC AND ANAEROBIC 10CC EACH   Final    Culture  Setup Time  05/22/2012 18:15   Final    Culture     Final    Value:        BLOOD CULTURE RECEIVED NO GROWTH TO DATE CULTURE WILL BE HELD FOR 5 DAYS BEFORE ISSUING A FINAL NEGATIVE REPORT   Report Status PENDING   Incomplete   CULTURE, BLOOD (ROUTINE X 2)     Status: Normal (Preliminary result)   Collection Time   05/22/12  9:42 AM      Component Value Range Status Comment   Specimen Description BLOOD HAND RIGHT   Final    Special Requests BOTTLES DRAWN AEROBIC AND ANAEROBIC 10CC EACH   Final    Culture  Setup Time 05/22/2012 18:15   Final    Culture     Final    Value:        BLOOD CULTURE RECEIVED NO GROWTH TO DATE CULTURE WILL BE HELD FOR 5 DAYS BEFORE ISSUING A FINAL NEGATIVE REPORT   Report Status PENDING   Incomplete   MRSA PCR SCREENING     Status: Normal   Collection Time   05/24/12  2:50 PM      Component Value Range  Status Comment   MRSA by PCR NEGATIVE  NEGATIVE Final   CLOSTRIDIUM DIFFICILE BY PCR     Status: Abnormal   Collection Time   05/24/12  2:53 PM      Component Value Range Status Comment   C difficile by pcr POSITIVE (*) NEGATIVE Final     Coagulation Studies: No results found for this basename: LABPROT:5,INR:5 in the last 72 hours  Urinalysis: No results found for this basename: COLORURINE:2,APPERANCEUR:2,LABSPEC:2,PHURINE:2,GLUCOSEU:2,HGBUR:2,BILIRUBINUR:2,KETONESUR:2,PROTEINUR:2,UROBILINOGEN:2,NITRITE:2,LEUKOCYTESUR:2 in the last 72 hours    Imaging: Ct Abdomen Pelvis Wo Contrast  05/24/2012  *RADIOLOGY REPORT*  Clinical Data: Rectal bleeding, recent abnormal CT with sigmoid wall thickening, new sepsis  CT ABDOMEN AND PELVIS WITHOUT CONTRAST  Technique:  Multidetector CT imaging of the abdomen and pelvis was performed following the standard protocol without intravenous contrast. Small amount of GI contrast is present.  Sagittal and coronal MPR images reconstructed from axial data set.  Comparison: 05/17/2012  Findings: Mild atelectasis left lung base. Right femoral line, tip IVC. Nasogastric tube in stomach. Minimal pericardial effusion Slightly increased. Streak artifacts from the patient's arms traverse the upper abdomen. Low to intermediate attenuation lesion within the anterior 5.1 x 4.0 cm image 30.  Within limits of a nonenhanced exam, no focal abnormalities of the liver, spleen, pancreas, kidneys or adrenal glands otherwise identified. Atherosclerotic calcifications without aneurysm. Large and small bowel loops are underdistended making assessment of wall thickness suboptimal. However there appears to be diffuse wall thickening of the colon with adjacent pericolic infiltration and multiple sites question colitis. Minimal sigmoid diverticulosis incidentally noted.  Bladder partially distended, cystocele noted. Probable 10 mm uterine leiomyoma at fundus. Scattered normal-sized mesenteric  nodes medial to right colon. No additional mass, adenopathy, free fluid, or free air. Bones appear diffusely demineralized.  IMPRESSION: Probable diffuse colitis; differential diagnosis includes infection, inflammatory bowel disease, and ischemia; when I called results to the patient's nurse, he indicated a new positive laboratory test for Clostridium difficile, which would account for this finding. Minimal pericardial effusion, increased. Remainder of exam unchanged from prior recent CT.  Findings called to [facility] on 2100 on 05/24/2012 at 1653 hours.   Original Report Authenticated By: Ulyses Southward, M.D.    Dg Chest Port 1 View  05/24/2012  *RADIOLOGY REPORT*  Clinical Data: Evaluate nasogastric tube and endotracheal  tube placements  PORTABLE CHEST - 1 VIEW  Comparison: Portable exam 1303 hours compared to 05/24/2012  Findings: Tip of endotracheal tube 1.5 cm above carina. Left jugular dual-lumen large-bore central venous catheter with tip projecting over the cavoatrial junction. Nasogastric tube extends into stomach. Upper normal-size of cardiac silhouette. Atherosclerotic calcification aorta. Mediastinal contours and pulmonary vascularity normal. No definite infiltrate, pleural effusion or pneumothorax. No acute osseous findings.  IMPRESSION: No acute abnormalities. Line and tube positions as above.   Original Report Authenticated By: Ulyses Southward, M.D.    Dg Chest Port 1 View  05/24/2012  **ADDENDUM** CREATED: 05/24/2012 10:14:16  Comparison exams from 05/19/2012 and 02/10/2011 are available.  Hilar prominence unchanged.  Pulmonary vascular congestion/mild pulmonary edema slightly more notable than on the prior exams.  **END ADDENDUM** SIGNED BY: Almedia Balls. Constance Goltz, M.D.   05/24/2012  *RADIOLOGY REPORT*  Clinical Data: Increase shortness of breath.  PORTABLE CHEST - 1 VIEW  Comparison: Currently, there are PACS problems and no comparison exams are available.  Findings: Left central line tip in the region of  the distal superior vena cava/cavoatrial junction.  No gross pneumothorax.  Mild pulmonary edema.  Hilar prominence may be vascular in origin.  Stability can be confirmed on follow-up.  Heart size top normal.  Calcified slightly tortuous aorta.  No segmental consolidation.  IMPRESSION: No comparisons presently available.  Mild pulmonary edema.  Please see above.   Original Report Authenticated By: Lacy Duverney, M.D.    Dg Chest [facility]  05/24/2012  Garnetta Buddy, MD     05/24/2012  9:17 AM I have seen and examined this patient and agree with the plan of  care. Low blood pressure and short of breath. patient now being  ultrafiltered. Check CXR doubt that more fluid can be removed.  Brad Lieurance W 05/24/2012, 9:16 AM       Medications:      . sodium chloride 20 mL/hr at 05/24/12 1731  . norepinephrine (LEVOPHED) Adult infusion 2 mcg/min (05/26/12 0316)  . vasopressin (PITRESSIN) infusion - *FOR SHOCK* 0.03 Units/min (05/26/12 0205)      . darbepoetin (ARANESP) injection - DIALYSIS  100 mcg Intravenous Q Mon-HD  . famotidine (PEPCID) IV  20 mg Intravenous Q24H  . ferric gluconate (FERRLECIT/NULECIT) IV  125 mg Intravenous Q M,W,F-HD  . insulin aspart  0-15 Units Subcutaneous Q4H  . levothyroxine  75 mcg Oral QAC breakfast  . vancomycin  500 mg Per Tube Q6H   And  . metronidazole  500 mg Intravenous Q8H  . multivitamin  1 tablet Oral Daily  . niacin  500 mg Oral Daily  . paricalcitol  1 mcg Intravenous 3 times weekly  . polyethylene glycol  17 g Oral Daily  . sodium chloride  250 mL Intravenous Once   sodium chloride, sodium chloride, acetaminophen, feeding supplement (NEPRO CARB STEADY), heparin, lidocaine, lidocaine-prilocaine, menthol-cetylpyridinium, methocarbamol, metoCLOPramide, ondansetron, oxyCODONE, pentafluoroprop-tetrafluoroeth, phenol, sorbitol  Assessment/ Plan:  ESRD- plan dialysis this morning  ANEMIA-stable ESA ansd Iron  MBD-stable  HTN/VOL-clear not much edema  on chest x ray hypotensive with dialysis now requiring pressors   ACCESS- Internal jugular permcath left for now  CT scan abdomen shows colitis with posive CDiff   LOS: 7 Bowyn Mercier W @TODAY @6 :50 AM

## 2012-05-26 NOTE — Progress Notes (Signed)
PULMONARY  / CRITICAL CARE MEDICINE  Name: [name] MRN: [id number] DOB: 08-Jan-1946    LOS: 7  REFERRING MD :  Dr. David Stall [facility])  CHIEF COMPLAINT:  Sepsis  BRIEF PATIENT DESCRIPTION: 66 y/o F admitted with L hip fracture s/p nailing 12/22.  ESRD withr ecent Perm Cath exchange 12/19.  12/26 noted to have fever of 103, tachycardia, dyspnea and change in mental status concerning for sepsis.  PCCM consulted for sepsis.  Found to have c diff colitis & septic shock requiring mechanical ventialtion   PMH:   recent fall with L Hip fracture s/p intramedullary nailing of L femur on 12/22 [facility]), ESRD on HD, ER visit for rectal bleeding 12/19  LINES / TUBES: 12/19 IJ Tunneled Cath (exchanged)>> 12/26 ETT >> 12/27 12/26 Rt femoral trialysis cath >>    CULTURES: 12/24 MRSA PCR>>>neg 12/24 BCx2>>> 12/26 BCx2>>> 12/26 C diff PCR >>>POS   ANTIBIOTICS: 12/25 Vanco>>> 12/26 PO vanc >> 12/26  flagyl >> 12/26 Zosyn>>>12/27   SIGNIFICANT EVENTS:  12/19 - Admit post fall with L hip fracture 12/22 - L hip repair with intramedullary nailing of L femur (Dr. Carola Frost)  LEVEL OF CARE:  ICU PRIMARY SERVICE:  TRH -->PCCM CONSULTANTS:  Handy, Renal CODE STATUS: Full Code DIET:  Full liquid DVT Px: SCD's GI Px:  None indicated   INTERVAL HISTORY:  Feels hungry.  Denies chest pain, dyspnea.  VITAL SIGNS: Temp:  [97.7 F (36.5 C)-98.8 F (37.1 C)] 98.3 F (36.8 C) (12/28 1546) Pulse Rate:  [34-111] 75  (12/28 1200) Resp:  [16-29] 23  (12/28 1200) BP: (90-146)/(35-107) 126/58 mmHg (12/28 1200) SpO2:  [69 %-100 %] 100 % (12/28 0908) Weight:  [173 lb 4.5 oz (78.6 kg)-178 lb 12.7 oz (81.1 kg)] 173 lb 4.5 oz (78.6 kg) (12/28 0843)  HEMODYNAMICS:    VENTILATOR SETTINGS:    INTAKE / OUTPUT: Intake/Output      12/27 0701 - 12/28 0700 12/28 0701 - 12/29 0700   P.O. 480    I.V. (mL/kg) 597.1 (7.4) 71.8 (0.9)   NG/GT 150    IV Piggyback 350 100   Total Intake(mL/kg) 1577.1  (19.4) 171.8 (2.2)   Emesis/NG output     Other     Stool     Total Output 0    Net +1577.1 +171.8        Stool Occurrence 5 x      PHYSICAL EXAMINATION: General: chronically ill female, no distress Neuro:  Awakens, follows commands, interactive HEENT:  Mm pink/dry, no jvd Cardiovascular:  s1s2 rrr, no m/r/g Lungs:  Scattered rhonchi Abdomen:  Round/soft, bsx4 hypoactive Musculoskeletal:  No acute deformities, L hip dressing c/d/i, L ankle wrapped with ace wrap Skin:  Warm/dry, no edema   LABS: Cbc  Lab 05/26/12 0400 05/25/12 1125 05/24/12 0729  WBC 26.5* -- --  HGB 7.2* 7.1* 8.0*  HCT 23.1* 22.7* 25.9*  PLT 124* 149* 169   Chemistry  Lab 05/26/12 0400 05/25/12 1125 05/24/12 1530 05/24/12 0729 05/21/12 1020  NA 137 140 139 -- --  K 3.5 3.9 4.4 -- --  CL 96 99 93* -- --  CO2 18* 21 16* -- --  BUN 57* 50* 32* -- --  CREATININE 8.31* 7.23* 5.67* -- --  CALCIUM 8.1* 8.5 8.3* -- --  MG -- -- -- -- --  PHOS -- -- -- 6.6* 6.2*  GLUCOSE 167* 189* 256* -- --   Liver fxn  Lab 05/24/12 0729 05/21/12 1020 05/19/12 1746  AST -- -- 19  ALT -- -- 13  ALKPHOS -- -- 165*  BILITOT -- -- 0.3  PROT -- -- 7.0  ALBUMIN 2.1* 2.6* 2.8*   coags  Lab 05/20/12 0028 05/19/12 1746  APTT -- --  INR 1.04 1.06   Sepsis markers  Lab 05/26/12 0400 05/25/12 0447 05/24/12 1538 05/24/12 1530  LATICACIDVEN -- -- 1.1 --  PROCALCITON 12.97 17.02 -- 12.17   Cardiac markers No results found for this basename: CKTOTAL:3,CKMB:3,TROPONINI:3 in the last 168 hours  BNP No results found for this basename: PROBNP:3 in the last 168 hours  ABG  Lab 05/25/12 1146 05/24/12 1509 05/24/12 1030  PHART 7.402 7.340* 7.412  PCO2ART 41.2 42.7 34.4*  PO2ART 96.0 325.0* 197.0*  HCO3 25.8* 22.7 21.5  TCO2 27 24 22.5    CBG trend  Lab 05/26/12 1527 05/26/12 1436 05/26/12 0735 05/26/12 0349 05/26/12 0024  GLUCAP 111* 108* 195* 161* 158*    IMAGING: 12/19 CT ABD / PELVIS>>> ? Early  appendicitis, diverticulosis of sigmoid colon without evidence of diverticulitis, renal cysts noted   DIAGNOSES: Principal Problem:  *Acute respiratory failure Active Problems:  ANEMIA OF RENAL FAILURE  HYPERTENSION  RENAL DISEASE, CHRONIC, STAGE V  Femur fracture, left  Fall at home  Fever  Severe sepsis  Sepsis with metabolic encephalopathy  Clostridium difficile colitis  Septic shock(785.52)   ASSESSMENT / PLAN:  PULMONARY  ASSESSMENT: Acute Respiratory failure - in setting of sepsis; resolved ? Hx COPD - no evidence of acute flare  PLAN:   -O2 to support sats > 90%   CARDIOVASCULAR  ASSESSMENT:  Septic shock Sinus Tachycardia  - in setting of sepsis, concern for infected perm cath. Hypotension  - mild, sbp 90's Hx HTN  PLAN:  -wean pressors to keep SBP > 90, MAP > 65  RENAL  ASSESSMENT:   Chronic Renal Failure  - HD M/W/F  PLAN:   -HD per renal -keep trialysis catheter in until off pressors  GASTROINTESTINAL  ASSESSMENT:   Rectal Bleeding - noted ER visit 12/19 with positive FOB, none noted this admit.  H/H stable.  PLAN:   -monitor h/h -hold heparin products -d/c pepcid >> SUP no longer indicated -full liquid diet and advance as tolerated  HEMATOLOGIC  ASSESSMENT:   Anemia - in setting of chronic disease  PLAN:  -no indication for transfusion at this time -f/u CBC  INFECTIOUS  ASSESSMENT:   Severe Sepsis - concern for line (perm cath) related sepsis (less likely) c diff colitis -diffuse colitis on  abd / pelvis CT   PLAN:   -continue IV vancomycin until blood cultures final -continue po flagyl, vancomycin for c diff    ENDOCRINE  ASSESSMENT:   DM  PLAN:   -SSI  NEUROLOGIC  ASSESSMENT:   Acute Encephalopathy  - in setting of severe sepsis; resolved  PLAN:   -supportive care -monitor mental status   Critical care time 35 minutes.  Coralyn Helling, MD [facility] Pulmonary/Critical Care 05/26/2012, 5:04 PM Pager:   [phone] After 3pm call: [phone]

## 2012-05-27 LAB — RENAL FUNCTION PANEL
Albumin: 1.9 g/dL — ABNORMAL LOW (ref 3.5–5.2)
Calcium: 8 mg/dL — ABNORMAL LOW (ref 8.4–10.5)
GFR calc Af Amer: 13 mL/min — ABNORMAL LOW (ref >90)
Glucose, Bld: 227 mg/dL — ABNORMAL HIGH (ref 70–99)
Phosphorus: 3.3 mg/dL (ref 2.3–4.6)
Sodium: 133 mEq/L — ABNORMAL LOW (ref 135–145)

## 2012-05-27 LAB — CBC
MCV: 82.5 fL (ref 78.0–100.0)
Platelets: 161 10*3/uL (ref 150–400)
RDW: 17.8 % — ABNORMAL HIGH (ref 11.5–15.5)
WBC: 22.6 10*3/uL — ABNORMAL HIGH (ref 4.0–10.5)

## 2012-05-27 LAB — GLUCOSE, CAPILLARY: Glucose-Capillary: 194 mg/dL — ABNORMAL HIGH (ref 70–99)

## 2012-05-27 MED ORDER — METRONIDAZOLE 500 MG PO TABS
500.0000 mg | ORAL_TABLET | Freq: Three times a day (TID) | ORAL | Status: DC
  Administered 2012-05-27 – 2012-05-30 (×9): 500 mg via ORAL
  Filled 2012-05-27 (×12): qty 1

## 2012-05-27 MED ORDER — HALOPERIDOL LACTATE 5 MG/ML IJ SOLN
5.0000 mg | Freq: Once | INTRAMUSCULAR | Status: AC
  Administered 2012-05-27: 5 mg via INTRAVENOUS

## 2012-05-27 MED ORDER — HALOPERIDOL LACTATE 5 MG/ML IJ SOLN
INTRAMUSCULAR | Status: AC
  Filled 2012-05-27: qty 1

## 2012-05-27 MED ORDER — HALOPERIDOL LACTATE 5 MG/ML IJ SOLN
1.0000 mg | Freq: Four times a day (QID) | INTRAMUSCULAR | Status: DC | PRN
  Administered 2012-05-27: 1 mg via INTRAVENOUS
  Filled 2012-05-27: qty 1

## 2012-05-27 MED ORDER — VANCOMYCIN HCL 1000 MG IV SOLR
750.0000 mg | Freq: Once | INTRAVENOUS | Status: DC
  Administered 2012-05-28: 750 mg via INTRAVENOUS
  Filled 2012-05-27 (×3): qty 750

## 2012-05-27 NOTE — Progress Notes (Signed)
PULMONARY  / CRITICAL CARE MEDICINE  Name: [name] MRN: [id number] DOB: 1945-09-22    LOS: 8  REFERRING MD :  Dr. David Stall [facility])  CHIEF COMPLAINT:  Sepsis  BRIEF PATIENT DESCRIPTION: 66 y/o F admitted with L hip fracture s/p nailing 12/22.  ESRD withr ecent Perm Cath exchange 12/19.  12/26 noted to have fever of 103, tachycardia, dyspnea and change in mental status concerning for sepsis.  PCCM consulted for sepsis.  Found to have c diff colitis & septic shock requiring mechanical ventialtion  PMH:   recent fall with L Hip fracture s/p intramedullary nailing of L femur on 12/22 [facility]), ESRD on HD, ER visit for rectal bleeding 12/19  LINES / TUBES: 12/19 IJ Tunneled Cath (exchanged)>> 12/26 ETT >> 12/27 12/26 Rt femoral trialysis cath >>    CULTURES: 12/24 MRSA PCR>>>neg 12/24 BCx2>>> 12/26 BCx2>>> 12/26 C diff PCR >>>POS   ANTIBIOTICS: 12/25 Vanco>>> 12/26 PO vanc >> 12/26  flagyl >> 12/26 Zosyn>>>12/27   SIGNIFICANT EVENTS:  12/19 - Admit post fall with L hip fracture 12/22 - L hip repair with intramedullary nailing of L femur (Dr. Carola Frost)  LEVEL OF CARE:  ICU PRIMARY SERVICE:  TRH -->PCCM CONSULTANTS:  Handy, Renal CODE STATUS: Full Code DIET:  Renal DVT Px: SCD's GI Px:  None indicated   INTERVAL HISTORY:  Denies chest/abd pain.  Confused.  VITAL SIGNS: Temp:  [97.7 F (36.5 C)-98.7 F (37.1 C)] 98.1 F (36.7 C) (12/29 0738) Pulse Rate:  [65-89] 88  (12/29 0500) Resp:  [15-28] 22  (12/29 0700) BP: (90-143)/(44-108) 143/53 mmHg (12/29 0700) SpO2:  [65 %-100 %] 98 % (12/29 0700) Weight:  [167 lb 15.9 oz (76.2 kg)-173 lb 15.1 oz (78.9 kg)] 173 lb 15.1 oz (78.9 kg) (12/29 0500)  INTAKE / OUTPUT: Intake/Output      12/28 0701 - 12/29 0700 12/29 0701 - 12/30 0700   P.O.     I.V. (mL/kg) 475 (6)    NG/GT     IV Piggyback 550    Total Intake(mL/kg) 1025 (13)    Other 1000    Stool 125    Total Output 1125    Net -100           PHYSICAL  EXAMINATION: General: chronically ill female, no distress Neuro:  Awakens, follows commands, interactive HEENT:  Mm pink/dry, no jvd Cardiovascular:  s1s2 rrr, no m/r/g Lungs:  Scattered rhonchi Abdomen:  Round/soft, bsx4 hypoactive Musculoskeletal:  No acute deformities, L hip dressing c/d/i, L ankle wrapped with ace wrap Skin:  Warm/dry, no edema   LABS: Cbc  Lab 05/27/12 0430 05/26/12 0400 05/25/12 1125  WBC 22.6* -- --  HGB 7.0* 7.2* 7.1*  HCT 22.7* 23.1* 22.7*  PLT 161 124* 149*   Chemistry  Lab 05/27/12 0430 05/26/12 0400 05/25/12 1125 05/24/12 0729 05/21/12 1020  NA 133* 137 140 -- --  K 3.7 3.5 3.9 -- --  CL 92* 96 99 -- --  CO2 19 18* 21 -- --  BUN 27* 57* 50* -- --  CREATININE 3.92* 8.31* 7.23* -- --  CALCIUM 8.0* 8.1* 8.5 -- --  MG -- -- -- -- --  PHOS 3.3 -- -- 6.6* 6.2*  GLUCOSE 227* 167* 189* -- --   Liver fxn  Lab 05/27/12 0430 05/24/12 0729 05/21/12 1020  AST -- -- --  ALT -- -- --  ALKPHOS -- -- --  BILITOT -- -- --  PROT -- -- --  ALBUMIN 1.9*  2.1* 2.6*   coags No results found for this basename: APTT:3,INR:3 in the last 168 hours Sepsis markers  Lab 05/26/12 0400 05/25/12 0447 05/24/12 1538 05/24/12 1530  LATICACIDVEN -- -- 1.1 --  PROCALCITON 12.97 17.02 -- 12.17   Cardiac markers No results found for this basename: CKTOTAL:3,CKMB:3,TROPONINI:3 in the last 168 hours  BNP No results found for this basename: PROBNP:3 in the last 168 hours  ABG  Lab 05/25/12 1146 05/24/12 1509 05/24/12 1030  PHART 7.402 7.340* 7.412  PCO2ART 41.2 42.7 34.4*  PO2ART 96.0 325.0* 197.0*  HCO3 25.8* 22.7 21.5  TCO2 27 24 22.5    CBG trend  Lab 05/27/12 0730 05/27/12 0413 05/27/12 0021 05/26/12 1934 05/26/12 1527  GLUCAP 194* 212* 284* 171* 111*    IMAGING: 12/19 CT ABD / PELVIS>>> ? Early appendicitis, diverticulosis of sigmoid colon without evidence of diverticulitis, renal cysts noted   DIAGNOSES: Principal Problem:  *Acute respiratory  failure Active Problems:  ANEMIA OF RENAL FAILURE  HYPERTENSION  RENAL DISEASE, CHRONIC, STAGE V  Femur fracture, left  Fall at home  Fever  Severe sepsis  Sepsis with metabolic encephalopathy  Clostridium difficile colitis  Septic shock(785.52)   ASSESSMENT / PLAN:  PULMONARY  ASSESSMENT: Acute Respiratory failure - in setting of sepsis; resolved ? Hx COPD - no evidence of acute flare  PLAN:   -O2 to support sats > 90%   CARDIOVASCULAR  ASSESSMENT:  Septic shock - resolved Sinus Tachycardia  - in setting of sepsis, concern for infected perm cath. Hypotension  - mild, sbp 90's Hx HTN  PLAN:  Off pressors 12/29  RENAL  ASSESSMENT:   Chronic Renal Failure  - HD M/W/F  PLAN:   -HD per renal -keep trialysis catheter in for now  GASTROINTESTINAL  ASSESSMENT:   Rectal Bleeding - noted ER visit 12/19 with positive FOB, none noted this admit.  H/H stable.  PLAN:   -monitor h/h -hold heparin products -renal diet  HEMATOLOGIC  ASSESSMENT:   Anemia - in setting of chronic disease PLAN:  -f/u CBC -transfuse 1 unit PRBC if SBP drops after pressors d/c'ed  INFECTIOUS  ASSESSMENT:   Severe Sepsis - concern for line (perm cath) related sepsis (less likely) c diff colitis -diffuse colitis on  abd / pelvis CT  PLAN:   -continue IV vancomycin until blood cultures final -continue po flagyl, vancomycin for c diff  ENDOCRINE  ASSESSMENT:   DM PLAN:   -SSI  NEUROLOGIC  ASSESSMENT:   Acute Encephalopathy  - in setting of severe sepsis; improved PLAN:   -supportive care -monitor mental status   Coralyn Helling, MD [facility] Pulmonary/Critical Care 05/27/2012, 9:01 AM Pager:  [phone] After 3pm call: [phone]

## 2012-05-27 NOTE — Progress Notes (Signed)
[facility] ROUNDING NOTE   Subjective:   Interval History: awake and alert but confused  Objective:  Vital signs in last 24 hours:  Temp:  [97.7 F (36.5 C)-98.8 F (37.1 C)] 98.7 F (37.1 C) (12/29 0435) Pulse Rate:  [65-89] 88  (12/29 0500) Resp:  [15-28] 23  (12/29 0608) BP: (90-141)/(44-108) 131/108 mmHg (12/29 0608) SpO2:  [65 %-100 %] 98 % (12/29 0608) Weight:  [76.2 kg (167 lb 15.9 oz)-78.9 kg (173 lb 15.1 oz)] 78.9 kg (173 lb 15.1 oz) (12/29 0500)  Weight change: -2.5 kg (-5 lb 8.2 oz) Filed Weights   05/26/12 0843 05/26/12 1320 05/27/12 0500  Weight: 78.6 kg (173 lb 4.5 oz) 76.2 kg (167 lb 15.9 oz) 78.9 kg (173 lb 15.1 oz)    Intake/Output: I/O last 3 completed shifts: In: 1594.3 [P.O.:240; I.V.:704.3; IV Piggyback:650] Out: 1125 [Other:1000; Stool:125]   Intake/Output this shift:     Awake and alert chronically ill female CVS- RRR  RS- CTA basal rales mild respiratory discomfort to conversation ABD- BS hypoactive but soft non-distended  EXT- no edema  L hip dressing c/d/i, L ankle wrapped with ace wrap     Basic Metabolic Panel:  Lab 05/27/12 1610 05/26/12 0400 05/25/12 1125 05/24/12 1530 05/24/12 0729 05/21/12 1020  NA 133* 137 140 139 139 --  K 3.7 3.5 3.9 4.4 4.7 --  CL 92* 96 99 93* 94* --  CO2 19 18* 21 16* 21 --  GLUCOSE 227* 167* 189* 256* 220* --  BUN 27* 57* 50* 32* 66* --  CREATININE 3.92* 8.31* 7.23* 5.67* 10.96* --  CALCIUM 8.0* 8.1* 8.5 -- -- --  MG -- -- -- -- -- --  PHOS 3.3 -- -- -- 6.6* 6.2*    Liver Function Tests:  Lab 05/27/12 0430 05/24/12 0729 05/21/12 1020  AST -- -- --  ALT -- -- --  ALKPHOS -- -- --  BILITOT -- -- --  PROT -- -- --  ALBUMIN 1.9* 2.1* 2.6*   No results found for this basename: LIPASE:5,AMYLASE:5 in the last 168 hours No results found for this basename: AMMONIA:3 in the last 168 hours  CBC:  Lab 05/27/12 0430 05/26/12 0400 05/25/12 1125 05/24/12 0729 05/23/12 0510  WBC 22.6*  26.5* 26.6* 33.1* 24.5*  NEUTROABS -- 23.6* -- 30.4* --  HGB 7.0* 7.2* 7.1* 8.0* 8.5*  HCT 22.7* 23.1* 22.7* 25.9* 26.9*  MCV 82.5 82.5 81.7 82.0 81.3  PLT 161 124* 149* 169 145*    Cardiac Enzymes: No results found for this basename: CKTOTAL:5,CKMB:5,CKMBINDEX:5,TROPONINI:5 in the last 168 hours  BNP: No components found with this basename: POCBNP:5  CBG:  Lab 05/27/12 0413 05/27/12 0021 05/26/12 1934 05/26/12 1527 05/26/12 1436  GLUCAP 212* 284* 171* 111* 108*    Microbiology: Results for orders placed during the hospital encounter of 05/19/12  SURGICAL PCR SCREEN     Status: Normal   Collection Time   05/20/12  1:45 AM      Component Value Range Status Comment   MRSA, PCR NEGATIVE  NEGATIVE Final    Staphylococcus aureus NEGATIVE  NEGATIVE Final   CULTURE, BLOOD (ROUTINE X 2)     Status: Normal (Preliminary result)   Collection Time   05/22/12  9:34 AM      Component Value Range Status Comment   Specimen Description BLOOD HAND RIGHT   Final    Special Requests BOTTLES DRAWN AEROBIC AND ANAEROBIC 10CC EACH   Final    Culture  Setup Time 05/22/2012 18:15   Final    Culture     Final    Value:        BLOOD CULTURE RECEIVED NO GROWTH TO DATE CULTURE WILL BE HELD FOR 5 DAYS BEFORE ISSUING A FINAL NEGATIVE REPORT   Report Status PENDING   Incomplete   CULTURE, BLOOD (ROUTINE X 2)     Status: Normal (Preliminary result)   Collection Time   05/22/12  9:42 AM      Component Value Range Status Comment   Specimen Description BLOOD HAND RIGHT   Final    Special Requests BOTTLES DRAWN AEROBIC AND ANAEROBIC 10CC EACH   Final    Culture  Setup Time 05/22/2012 18:15   Final    Culture     Final    Value:        BLOOD CULTURE RECEIVED NO GROWTH TO DATE CULTURE WILL BE HELD FOR 5 DAYS BEFORE ISSUING A FINAL NEGATIVE REPORT   Report Status PENDING   Incomplete   MRSA PCR SCREENING     Status: Normal   Collection Time   05/24/12  2:50 PM      Component Value Range Status Comment    MRSA by PCR NEGATIVE  NEGATIVE Final   CLOSTRIDIUM DIFFICILE BY PCR     Status: Abnormal   Collection Time   05/24/12  2:53 PM      Component Value Range Status Comment   C difficile by pcr POSITIVE (*) NEGATIVE Final     Coagulation Studies: No results found for this basename: LABPROT:5,INR:5 in the last 72 hours  Urinalysis: No results found for this basename: COLORURINE:2,APPERANCEUR:2,LABSPEC:2,PHURINE:2,GLUCOSEU:2,HGBUR:2,BILIRUBINUR:2,KETONESUR:2,PROTEINUR:2,UROBILINOGEN:2,NITRITE:2,LEUKOCYTESUR:2 in the last 72 hours    Imaging: No results found.   Medications:      . sodium chloride 20 mL/hr at 05/24/12 1731  . norepinephrine (LEVOPHED) Adult infusion 2 mcg/min (05/26/12 0316)  . vasopressin (PITRESSIN) infusion - *FOR SHOCK* 0.03 Units/min (05/26/12 2000)      . darbepoetin (ARANESP) injection - DIALYSIS  100 mcg Intravenous Q Mon-HD  . ferric gluconate (FERRLECIT/NULECIT) IV  125 mg Intravenous Q M,W,F-HD  . insulin aspart  0-15 Units Subcutaneous Q4H  . levothyroxine  75 mcg Oral QAC breakfast  . vancomycin  500 mg Oral Q6H   And  . metronidazole  500 mg Intravenous Q8H  . multivitamin  1 tablet Oral Daily  . niacin  500 mg Oral Daily  . paricalcitol  1 mcg Intravenous 3 times weekly  . sodium chloride  250 mL Intravenous Once   sodium chloride, sodium chloride, acetaminophen, feeding supplement (NEPRO CARB STEADY), haloperidol lactate, heparin, lidocaine, lidocaine-prilocaine, menthol-cetylpyridinium, methocarbamol, metoCLOPramide, ondansetron, oxyCODONE, pentafluoroprop-tetrafluoroeth, phenol, sorbitol  Assessment/ Plan:  ESRD- plan dialysis tomorrow  ANEMIA-stable ESA ansd Iron  MBD-stable  HTN/VOL-clear not much edema on chest x ray hypotensive with dialysis now requiring pressors  ACCESS- Internal jugular permcath left for now  CT scan abdomen shows colitis with posive CDiff   LOS: 8 [name] W @TODAY @7 :23 AM

## 2012-05-27 NOTE — Progress Notes (Signed)
ANTIBIOTIC CONSULT NOTE   Pharmacy Consult for Vancomycin Indication: rule out sepsis s/p femur nailing  Allergies  Allergen Reactions  . Contrast Media (Iodinated Diagnostic Agents) Itching    Patient Measurements: Height: 5\' 3"  (160 cm) Weight: 173 lb 15.1 oz (78.9 kg) IBW/kg (Calculated) : 52.4   Labs:  Basename 05/27/12 0430 05/26/12 0400 05/25/12 1125  WBC 22.6* 26.5* 26.6*  HGB 7.0* 7.2* 7.1*  PLT 161 124* 149*  LABCREA -- -- --  CREATININE 3.92* 8.31* 7.23*   Estimated Creatinine Clearance: 14 ml/min (by C-G formula based on Cr of 3.92). No results found for this basename: VANCOTROUGH:2,VANCOPEAK:2,VANCORANDOM:2,GENTTROUGH:2,GENTPEAK:2,GENTRANDOM:2,TOBRATROUGH:2,TOBRAPEAK:2,TOBRARND:2,AMIKACINPEAK:2,AMIKACINTROU:2,AMIKACIN:2, in the last 72 hours   Microbiology: Recent Results (from the past 720 hour(s))  SURGICAL PCR SCREEN     Status: Normal   Collection Time   05/20/12  1:45 AM      Component Value Range Status Comment   MRSA, PCR NEGATIVE  NEGATIVE Final    Staphylococcus aureus NEGATIVE  NEGATIVE Final   CULTURE, BLOOD (ROUTINE X 2)     Status: Normal (Preliminary result)   Collection Time   05/22/12  9:34 AM      Component Value Range Status Comment   Specimen Description BLOOD HAND RIGHT   Final    Special Requests BOTTLES DRAWN AEROBIC AND ANAEROBIC 10CC EACH   Final    Culture  Setup Time 05/22/2012 18:15   Final    Culture     Final    Value:        BLOOD CULTURE RECEIVED NO GROWTH TO DATE CULTURE WILL BE HELD FOR 5 DAYS BEFORE ISSUING A FINAL NEGATIVE REPORT   Report Status PENDING   Incomplete   CULTURE, BLOOD (ROUTINE X 2)     Status: Normal (Preliminary result)   Collection Time   05/22/12  9:42 AM      Component Value Range Status Comment   Specimen Description BLOOD HAND RIGHT   Final    Special Requests BOTTLES DRAWN AEROBIC AND ANAEROBIC 10CC EACH   Final    Culture  Setup Time 05/22/2012 18:15   Final    Culture     Final    Value:         BLOOD CULTURE RECEIVED NO GROWTH TO DATE CULTURE WILL BE HELD FOR 5 DAYS BEFORE ISSUING A FINAL NEGATIVE REPORT   Report Status PENDING   Incomplete   MRSA PCR SCREENING     Status: Normal   Collection Time   05/24/12  2:50 PM      Component Value Range Status Comment   MRSA by PCR NEGATIVE  NEGATIVE Final   CLOSTRIDIUM DIFFICILE BY PCR     Status: Abnormal   Collection Time   05/24/12  2:53 PM      Component Value Range Status Comment   C difficile by pcr POSITIVE (*) NEGATIVE Final     Medical History: Past Medical History  Diagnosis Date  . Dialysis care     M-W-F dialysis at [facility]  . COPD (chronic obstructive pulmonary disease)   . Diabetes mellitus   . Hypertension   . Thyroid disease   . Hyperlipidemia   . Bronchitis   . Leg pain   . Renal disorder    Assessment: 66 year old female with ESRD (HD MWF) s/p femur fracture nailing 12/22.  IV vanc started for fevers. Pt has positive c.diff. MD also want to r/o line infection so vanc has been continued until blood cx  are neg.   Goal of Therapy:  PreHD vanc = 15-25  Plan:   Vanc 750mg  IV x1 after HD 12/30

## 2012-05-28 ENCOUNTER — Encounter (HOSPITAL_COMMUNITY): Payer: Self-pay

## 2012-05-28 LAB — CBC
HCT: 22.6 % — ABNORMAL LOW (ref 36.0–46.0)
Hemoglobin: 7.2 g/dL — ABNORMAL LOW (ref 12.0–15.0)
MCH: 25.7 pg — ABNORMAL LOW (ref 26.0–34.0)
MCV: 80.7 fL (ref 78.0–100.0)
MCV: 81.2 fL (ref 78.0–100.0)
Platelets: 128 10*3/uL — ABNORMAL LOW (ref 150–400)
Platelets: 128 10*3/uL — ABNORMAL LOW (ref 150–400)
RBC: 2.8 MIL/uL — ABNORMAL LOW (ref 3.87–5.11)
RBC: 3.08 MIL/uL — ABNORMAL LOW (ref 3.87–5.11)
WBC: 13.5 10*3/uL — ABNORMAL HIGH (ref 4.0–10.5)

## 2012-05-28 LAB — RENAL FUNCTION PANEL
BUN: 38 mg/dL — ABNORMAL HIGH (ref 6–23)
CO2: 23 mEq/L (ref 19–32)
Chloride: 96 mEq/L (ref 96–112)
Glucose, Bld: 155 mg/dL — ABNORMAL HIGH (ref 70–99)
Potassium: 2.9 mEq/L — ABNORMAL LOW (ref 3.5–5.1)

## 2012-05-28 LAB — GLUCOSE, CAPILLARY
Glucose-Capillary: 144 mg/dL — ABNORMAL HIGH (ref 70–99)
Glucose-Capillary: 149 mg/dL — ABNORMAL HIGH (ref 70–99)
Glucose-Capillary: 194 mg/dL — ABNORMAL HIGH (ref 70–99)
Glucose-Capillary: 275 mg/dL — ABNORMAL HIGH (ref 70–99)

## 2012-05-28 LAB — CULTURE, BLOOD (ROUTINE X 2): Culture: NO GROWTH

## 2012-05-28 LAB — BASIC METABOLIC PANEL
CO2: 24 mEq/L (ref 19–32)
Chloride: 100 mEq/L (ref 96–112)
Creatinine, Ser: 2.58 mg/dL — ABNORMAL HIGH (ref 0.50–1.10)
Potassium: 3.6 mEq/L (ref 3.5–5.1)

## 2012-05-28 LAB — MAGNESIUM: Magnesium: 1.9 mg/dL (ref 1.5–2.5)

## 2012-05-28 MED ORDER — SODIUM CHLORIDE 0.9 % IV SOLN: 100.0000 mL | INTRAVENOUS | Status: DC | PRN

## 2012-05-28 MED ORDER — HEPARIN SODIUM (PORCINE) 1000 UNIT/ML DIALYSIS
2000.0000 [IU] | INTRAMUSCULAR | Status: DC | PRN
  Filled 2012-05-28: qty 2

## 2012-05-28 MED ORDER — PENTAFLUOROPROP-TETRAFLUOROETH EX AERO: 1.0000 "application " | INHALATION_SPRAY | CUTANEOUS | Status: DC | PRN

## 2012-05-28 MED ORDER — HEPARIN SODIUM (PORCINE) 1000 UNIT/ML DIALYSIS
1000.0000 [IU] | INTRAMUSCULAR | Status: DC | PRN
  Filled 2012-05-28: qty 1

## 2012-05-28 MED ORDER — VANCOMYCIN HCL 1000 MG IV SOLR
750.0000 mg | Freq: Once | INTRAVENOUS | Status: DC
  Filled 2012-05-28: qty 750

## 2012-05-28 MED ORDER — NEPRO/CARBSTEADY PO LIQD
237.0000 mL | ORAL | Status: DC | PRN
  Filled 2012-05-28: qty 237

## 2012-05-28 MED ORDER — HEPARIN SODIUM (PORCINE) 1000 UNIT/ML DIALYSIS
7600.0000 [IU] | Freq: Once | INTRAMUSCULAR | Status: AC
  Administered 2012-05-29: 7600 [IU] via INTRAVENOUS_CENTRAL
  Filled 2012-05-28: qty 8

## 2012-05-28 MED ORDER — ALTEPLASE 2 MG IJ SOLR
2.0000 mg | Freq: Once | INTRAMUSCULAR | Status: AC | PRN
  Filled 2012-05-28 (×3): qty 2

## 2012-05-28 MED ORDER — ALTEPLASE 2 MG IJ SOLR
2.0000 mg | Freq: Once | INTRAMUSCULAR | Status: AC | PRN
  Filled 2012-05-28: qty 2

## 2012-05-28 MED ORDER — NEPRO/CARBSTEADY PO LIQD: 237.0000 mL | ORAL | Status: DC | PRN

## 2012-05-28 MED ORDER — LIDOCAINE-PRILOCAINE 2.5-2.5 % EX CREA: 1.0000 "application " | TOPICAL_CREAM | CUTANEOUS | Status: DC | PRN

## 2012-05-28 MED ORDER — DARBEPOETIN ALFA-POLYSORBATE 100 MCG/0.5ML IJ SOLN
INTRAMUSCULAR | Status: AC
  Administered 2012-05-28: 100 ug via INTRAVENOUS
  Filled 2012-05-28: qty 0.5

## 2012-05-28 MED ORDER — LIDOCAINE HCL (PF) 1 % IJ SOLN: 5.0000 mL | INTRAMUSCULAR | Status: DC | PRN

## 2012-05-28 MED ORDER — PARICALCITOL 5 MCG/ML IV SOLN
INTRAVENOUS | Status: AC
  Administered 2012-05-28: 1 ug via INTRAVENOUS
  Filled 2012-05-28: qty 1

## 2012-05-28 NOTE — Progress Notes (Signed)
     [facility] (OTS)  Subjective: 8 Days Post-Op Procedure(s) (LRB): INTRAMEDULLARY (IM) RETROGRADE FEMORAL NAILING (Left)  Pt at HD this am No PT eval over weekend  Objective: Current Vitals Blood pressure 136/44, pulse 77, temperature 97.9 F (36.6 C), temperature source Oral, resp. rate 25, height 5\' 3"  (1.6 m), weight 81.2 kg (179 lb 0.2 oz), SpO2 98.00%. Vital signs in last 24 hours: Temp:  [97.6 F (36.4 C)-98.7 F (37.1 C)] 97.9 F (36.6 C) (12/30 0400) Pulse Rate:  [72-88] 77  (12/29 2108) Resp:  [16-28] 25  (12/30 0600) BP: (92-136)/(37-71) 136/44 mmHg (12/30 0600) SpO2:  [89 %-100 %] 98 % (12/30 0600) Weight:  [81.2 kg (179 lb 0.2 oz)] 81.2 kg (179 lb 0.2 oz) (12/30 0458)  Intake/Output from previous day: 12/29 0701 - 12/30 0700 In: 418 [I.V.:418] Out: 100 [Stool:100]  LABS  Basename 05/28/12 0500 05/27/12 0430 05/26/12 0400 05/25/12 1125  HGB 7.2* 7.0* 7.2* 7.1*    Basename 05/28/12 0500 05/27/12 0430  WBC 16.3* 22.6*  RBC 2.80* 2.75*  HCT 22.6* 22.7*  PLT 128* 161    Basename 05/28/12 0500 05/27/12 0430  NA 135 133*  K 2.9* 3.7  CL 96 92*  CO2 23 19  BUN 38* 27*  CREATININE 5.88* 3.92*  GLUCOSE 155* 227*  CALCIUM 8.1* 8.0*   No results found for this basename: LABPT:2,INR:2 in the last 72 hours   Physical Exam  Did not evaluate pt this am as she is on HD Will check tomorrow am Pt will need PT eval  Imaging No results found.  Assessment/Plan: 8 Days Post-Op Procedure(s) (LRB): INTRAMEDULLARY (IM) RETROGRADE FEMORAL NAILING (Left)  66 y/o female s/p fall  1. Fall 2. L supracondylar distal femur fx POD 8  NWB L leg  Unrestricted L knee and hip ROM, ankle ROM as tolerated   Resume therapies as able to do so  No pillows directly under the knee, place under the ankle  Float heels off bed  Dressing changes as needed 3. Continue per medicine and nephrology 4. Dispo  Pt will need SNF once medically stable for  d/c  Ortho issues remain stable  Mearl Latin, PA-C [facility] [phone] (P) 05/28/2012, 8:46 AM

## 2012-05-28 NOTE — Progress Notes (Signed)
Inpatient Diabetes Program Recommendations  AACE/ADA: New Consensus Statement on Inpatient Glycemic Control (2013)  Target Ranges:  Prepandial:   less than 140 mg/dL      Peak postprandial:   less than 180 mg/dL (1-2 hours)      Critically ill patients:  140 - 180 mg/dL   Results for SCHYLAR, ALLARD (MRN [id number]) as of 05/28/2012 13:36  Ref. Range 05/27/2012 00:21 05/27/2012 04:13 05/27/2012 07:30 05/27/2012 11:42 05/27/2012 15:57 05/27/2012 19:41 05/27/2012 23:32 05/28/2012 04:11  Glucose-Capillary Latest Range: 70-99 mg/dL 409 (H) 811 (H) 914 (H) 286 (H) 332 (H) 208 (H) 144 (H) 149 (H)    Inpatient Diabetes Program Recommendations Insulin - Basal: Please consider ordering basal insulin since blood glucose is ranging from 144-332 mg/dl over the past 24 hours.    Note: According to the patient's chart, she takes Humalog 75/25 35 units every morning and 15 units every evening and Glipizide for diabetes management at home.  Since blood glucose has been consistently elevated, please consider ordering low dose basal insulin.  Would not recommend using home doses of Humalog 75/25; however, recommend Levemir 10 units QHS.  Will continue to follow.  Thanks, Orlando Penner, RN, BSN, CCRN Diabetes Coordinator Inpatient Diabetes Program [phone]

## 2012-05-28 NOTE — Progress Notes (Signed)
OT Cancellation Note  Patient Details Name: [name] MRN: [id number] DOB: Nov 19, 1945   Cancelled Treatment:    Reason Eval/Treat Not Completed: Patient at procedure or test/ unavailable - pt in HD  Demarqus Jocson, [place] M 05/28/2012, 3:43 PM

## 2012-05-28 NOTE — Progress Notes (Signed)
05/28/2012 monitor tech notify nurse at 1540, that patient had 6 beats of svt at 1414 and trigeminy at 1428. Patient vitals were taken at 1545 it was 127/56, 983.3, 97, 20, 100%ra. Dr Frederico Hamman notify at 1556. Orders were given for lab drawn. Continue to monitor.Gloriajean Dell RN

## 2012-05-28 NOTE — Progress Notes (Signed)
Gave report to HD RN. HD RNs have taken pt to HD.

## 2012-05-28 NOTE — Progress Notes (Signed)
Physical Therapy Treatment Patient Details Name: [name] MRN: [id number] DOB: 06/08/1945 Today's Date: 05/28/2012 Time: 1720-1746 PT Time Calculation (min): 26 min  PT Assessment / Plan / Recommendation Comments on Treatment Session  pt still too lethargic to follow direction without lots of repetitive cuing. Completed an extensive exercise program today.    Follow Up Recommendations  SNF     Does the patient have the potential to tolerate intense rehabilitation     Barriers to Discharge        Equipment Recommendations  Rolling walker with 5" wheels;Wheelchair cushion (measurements PT);Wheelchair (measurements PT)    Recommendations for Other Services    Frequency Min 3X/week   Plan Discharge plan remains appropriate;Frequency remains appropriate    Precautions / Restrictions Precautions Precautions: Fall Restrictions Weight Bearing Restrictions: Yes LLE Weight Bearing: Non weight bearing   Pertinent Vitals/Pain     Mobility  Bed Mobility Bed Mobility: Not assessed Transfers Transfers: Not assessed Ambulation/Gait Ambulation/Gait Assistance: Not tested (comment)    Exercises General Exercises - Lower Extremity Quad Sets: AROM;15 reps;Both;Supine Gluteal Sets: AROM;Both;15 reps;Supine Short Arc Quad: AROM;Both;10 reps;Supine Heel Slides: AAROM;Left;15 reps;Supine Hip ABduction/ADduction: AAROM;Both;15 reps;Supine Straight Leg Raises: AROM;Right;10 reps;Supine   PT Diagnosis:    PT Problem List:   PT Treatment Interventions:     PT Goals Acute Rehab PT Goals Time For Goal Achievement: 06/04/12 Potential to Achieve Goals: Fair Additional Goals Additional Goal #1: pt will actively complete general exercise program with cuing. PT Goal: Additional Goal #1 - Progress: Goal set today  Visit Information  Last PT Received On: 05/28/12 Assistance Needed: +2 (for mobility)    Subjective Data      Cognition  Overall Cognitive Status: Impaired Area of  Impairment: Attention;Memory;Safety/judgement;Following commands;Problem solving Arousal/Alertness: Lethargic Behavior During Session: Lethargic Current Attention Level: Sustained Following Commands: Follows one step commands inconsistently    Balance     End of Session PT - End of Session Activity Tolerance: Patient limited by fatigue Patient left: in bed;with call bell/phone within reach Nurse Communication: Weight bearing status;Mobility status   GP     Marshia Tropea, Eliseo Gum 05/28/2012, 4:56 PM  05/28/2012  [place] Bing, PT [phone] (pager)

## 2012-05-28 NOTE — Procedures (Signed)
I was present at this dialysis session. I have reviewed the session itself and made appropriate changes.   Rob Tiffancy Moger, MD [facility] 05/28/2012, 9:30 AM   

## 2012-05-28 NOTE — Procedures (Signed)
4 hour hemodialysis treatment completed via left IJ catheter.  Pt also has right femoral triple lumen catheter with NS @KVO  through 2 lines.  Pre HD serum K 2.9; pt dialyzed on 4K bath.  Hgb 7.2.  Of note, pt is Jehovah's Witness and opposes transfusion of blood products.  Heparin free system clotted 3 hours into HD (despite NS flushes q30 min) and unable to reinfuse venous circuit.  EBL 200cc.  Discussed with Dr. Arlean Hopping.  Heparin 2000u was given with new ECC and treatment was completed without further incident.  Complications of anemia discussed with pt.

## 2012-05-28 NOTE — Progress Notes (Signed)
eLink Physician-Brief Progress Note Patient Name: [name] DOB: 1946/03/22 MRN: [id number]  Date of Service  05/28/2012   HPI/Events of Note   Hypokalemia, per 12/29 renal note HD planned 12/30  eICU Interventions  Defer K replacement pending HD   Intervention Category Intermediate Interventions: Electrolyte abnormality - evaluation and management  MCQUAID, DOUGLAS 05/28/2012, 6:30 AM

## 2012-05-28 NOTE — Progress Notes (Addendum)
[facility] ROUNDING NOTE   Subjective:   Interval History: awake, oriented x 3 today. Diarrhea "slowing down".    Objective:  Vital signs in last 24 hours:  Temp:  [97.6 F (36.4 C)-98.7 F (37.1 C)] 98.1 F (36.7 C) (12/30 0715) Pulse Rate:  [72-88] 81  (12/30 0715) Resp:  [16-28] 24  (12/30 0715) BP: (92-136)/(37-71) 126/55 mmHg (12/30 0715) SpO2:  [89 %-100 %] 100 % (12/30 0715) Weight:  [78.7 kg (173 lb 8 oz)-81.2 kg (179 lb 0.2 oz)] 78.7 kg (173 lb 8 oz) (12/30 0715)  Weight change: 2.6 kg (5 lb 11.7 oz) Filed Weights   05/27/12 0500 05/28/12 0458 05/28/12 0715  Weight: 78.9 kg (173 lb 15.1 oz) 81.2 kg (179 lb 0.2 oz) 78.7 kg (173 lb 8 oz)    Intake/Output: I/O last 3 completed shifts: In: 846 [I.V.:646; IV Piggyback:200] Out: 225 [Stool:225]   Intake/Output this shift:     Basic Metabolic Panel:  Lab 05/28/12 1610 05/27/12 0430 05/26/12 0400 05/25/12 1125 05/24/12 1530 05/24/12 0729 05/21/12 1020  NA 135 133* 137 140 139 -- --  K 2.9* 3.7 3.5 3.9 4.4 -- --  CL 96 92* 96 99 93* -- --  CO2 23 19 18* 21 16* -- --  GLUCOSE 155* 227* 167* 189* 256* -- --  BUN 38* 27* 57* 50* 32* -- --  CREATININE 5.88* 3.92* 8.31* 7.23* 5.67* -- --  CALCIUM 8.1* 8.0* 8.1* -- -- -- --  MG -- -- -- -- -- -- --  PHOS 3.5 3.3 -- -- -- 6.6* 6.2*    Liver Function Tests:  Lab 05/28/12 0500 05/27/12 0430 05/24/12 0729 05/21/12 1020  AST -- -- -- --  ALT -- -- -- --  ALKPHOS -- -- -- --  BILITOT -- -- -- --  PROT -- -- -- --  ALBUMIN 2.1* 1.9* 2.1* 2.6*   No results found for this basename: LIPASE:5,AMYLASE:5 in the last 168 hours No results found for this basename: AMMONIA:3 in the last 168 hours  CBC:  Lab 05/28/12 0500 05/27/12 0430 05/26/12 0400 05/25/12 1125 05/24/12 0729  WBC 16.3* 22.6* 26.5* 26.6* 33.1*  NEUTROABS -- -- 23.6* -- 30.4*  HGB 7.2* 7.0* 7.2* 7.1* 8.0*  HCT 22.6* 22.7* 23.1* 22.7* 25.9*  MCV 80.7 82.5 82.5 81.7 82.0  PLT 128* 161 124*  149* 169    Cardiac Enzymes: No results found for this basename: CKTOTAL:5,CKMB:5,CKMBINDEX:5,TROPONINI:5 in the last 168 hours  BNP: No components found with this basename: POCBNP:5  CBG:  Lab 05/28/12 0411 05/27/12 2332 05/27/12 1941 05/27/12 1557 05/27/12 1142  GLUCAP 149* 144* 208* 332* 286*    Microbiology: Results for orders placed during the hospital encounter of 05/19/12  SURGICAL PCR SCREEN     Status: Normal   Collection Time   05/20/12  1:45 AM      Component Value Range Status Comment   MRSA, PCR NEGATIVE  NEGATIVE Final    Staphylococcus aureus NEGATIVE  NEGATIVE Final   CULTURE, BLOOD (ROUTINE X 2)     Status: Normal   Collection Time   05/22/12  9:34 AM      Component Value Range Status Comment   Specimen Description BLOOD HAND RIGHT   Final    Special Requests BOTTLES DRAWN AEROBIC AND ANAEROBIC 10CC EACH   Final    Culture  Setup Time 05/22/2012 18:15   Final    Culture NO GROWTH 5 DAYS   Final    Report  Status 05/28/2012 FINAL   Final   CULTURE, BLOOD (ROUTINE X 2)     Status: Normal   Collection Time   05/22/12  9:42 AM      Component Value Range Status Comment   Specimen Description BLOOD HAND RIGHT   Final    Special Requests BOTTLES DRAWN AEROBIC AND ANAEROBIC 10CC EACH   Final    Culture  Setup Time 05/22/2012 18:15   Final    Culture NO GROWTH 5 DAYS   Final    Report Status 05/28/2012 FINAL   Final   MRSA PCR SCREENING     Status: Normal   Collection Time   05/24/12  2:50 PM      Component Value Range Status Comment   MRSA by PCR NEGATIVE  NEGATIVE Final   CLOSTRIDIUM DIFFICILE BY PCR     Status: Abnormal   Collection Time   05/24/12  2:53 PM      Component Value Range Status Comment   C difficile by pcr POSITIVE (*) NEGATIVE Final     Coagulation Studies: No results found for this basename: LABPROT:5,INR:5 in the last 72 hours  Urinalysis: No results found for this basename:  COLORURINE:2,APPERANCEUR:2,LABSPEC:2,PHURINE:2,GLUCOSEU:2,HGBUR:2,BILIRUBINUR:2,KETONESUR:2,PROTEINUR:2,UROBILINOGEN:2,NITRITE:2,LEUKOCYTESUR:2 in the last 72 hours    Imaging: No results found.   Medications:      . sodium chloride 20 mL/hr at 05/24/12 1731      . darbepoetin (ARANESP) injection - DIALYSIS  100 mcg Intravenous Q Mon-HD  . ferric gluconate (FERRLECIT/NULECIT) IV  125 mg Intravenous Q M,W,F-HD  . insulin aspart  0-15 Units Subcutaneous Q4H  . levothyroxine  75 mcg Oral QAC breakfast  . metroNIDAZOLE  500 mg Oral Q8H  . multivitamin  1 tablet Oral Daily  . niacin  500 mg Oral Daily  . paricalcitol  1 mcg Intravenous 3 times weekly  . sodium chloride  250 mL Intravenous Once  . vancomycin  500 mg Oral Q6H  . vancomycin  750 mg Intravenous Once   sodium chloride, sodium chloride, acetaminophen, alteplase, feeding supplement (NEPRO CARB STEADY), haloperidol lactate, heparin, heparin, lidocaine, lidocaine-prilocaine, menthol-cetylpyridinium, methocarbamol, metoCLOPramide, ondansetron, oxyCODONE, pentafluoroprop-tetrafluoroeth, phenol, sorbitol Exam: Awake and alert chronically ill female CVS- RRR  RS- CTA basal rales mild respiratory discomfort to conversation ABD- BS hypoactive but soft non-distended  EXT- no edema  L hip dressing c/d/i, L ankle wrapped with ace wrap  Dialysis Orders: Center: [facility] on MWF.  EDW 76.5 kg HD Bath 2K/2.25Ca Time 4 hrs Heparin 7600-U bolus, 2000-U mid HD. Access Left IJ catheter BFR 400 DFR 800  Zemplar 1 mcg IV/HD Epogen 0 Units IV/HD Venofer 100 mg per HD.   Assessment/ Plan:  1. S/p L distal femur fx w ORIF 12/22 by Dr. Carola Frost 2. ESRD on hd mwf via IJ cath. Blood cx's negative, no evidence of tunneled HD cath infection. Will use tunneled cath today and pull groin line if not needed for IV access. Plan HD Mon-Tues-Fri this week due to holiday.  3. Shock, due to sepsis ? From Cdif- resolved, off pressors x 48 hrs 4. Cdif+-  improved diarrhea 5. HTN/volume- 2kg over dry weight, UF 2 kg as tolerated today. Was on lisinopril 20/hs, doxazosin 8/hs and amlodipine 5/d at home. Not getting any BP meds yet due to recent shock/sepsis.  6. VDRF- resolved 7. AMS- due to sepsis, resolved for the most part 8. 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 No binders or sensipar listed on HD center med list 9. Anemia of CKD- getting darbe 100/Mon  in place of EPO. Hb 7.2, Jehovah's Witness will not take transfusion of blood products  Vinson Moselle  MD [facility] [phone] pgr    [phone] cell 05/28/2012, 9:26 AM

## 2012-05-28 NOTE — Progress Notes (Signed)
Clinical Social Work  CSW attempted to meet with patient but patient out of room. CSW will follow up at later time to discuss dc plans.  [place], [place] [phone]

## 2012-05-28 NOTE — Progress Notes (Signed)
[facility] ADULT ICU REPLACEMENT PROTOCOL FOR AM LAB REPLACEMENT ONLY  The patient does not apply for the [facility] Adult ICU Electrolyte Replacment Protocol based on the criteria listed below:   3. Is BUN < 30 mg/dL? no  Patient's BUN today 38 4. Abnormal electrolyte(s):K+2.9  [name] [facility] 05/28/2012 6:26 AM

## 2012-05-28 NOTE — Progress Notes (Addendum)
05/28/2012 patient transfer from 2100 to 6700 at 1230. Patient seems alert, oriented to person and place. [name] weak and little lathergic. Patient had suture to the left knee and the the left side of upper leg.  The left knee is swollen there is  Dark area on  top of feet. On lower  Leg.  Patient have flexiseal.  The right foot have discolor area to the lower leg. Bilateral feet dry. Patient have left femoral catheter and a left hd catheter. Patient have suture in left grown, left knee and the side of the upper left area of knee. Blister on left lower leg with mepilex. On contact for c-diff. Was place on telemetry. Gloriajean Dell RN

## 2012-05-28 NOTE — Progress Notes (Signed)
I was called by the nurse, patient with several beats of non sustained SVT? And trigemini   Morning K 2.9 and Hg 7.0; she received HD in the interim; repeat electrolytes.

## 2012-05-28 NOTE — Progress Notes (Signed)
PULMONARY  / CRITICAL CARE MEDICINE  Name: [name] MRN: [id number] DOB: February 18, 1946    LOS: 9  REFERRING MD :  Dr. David Stall [facility])  CHIEF COMPLAINT:  Sepsis  BRIEF PATIENT DESCRIPTION: 66 y/o F admitted with L hip fracture s/p nailing 12/22.  ESRD withr ecent Perm Cath exchange 12/19.  12/26 noted to have fever of 103, tachycardia, dyspnea and change in mental status concerning for sepsis.  PCCM consulted for sepsis.  Found to have c diff colitis & septic shock requiring mechanical ventialtion  PMH:   recent fall with L Hip fracture s/p intramedullary nailing of L femur on 12/22 [facility]), ESRD on HD, ER visit for rectal bleeding 12/19  LINES / TUBES: 12/19 IJ Tunneled Cath (exchanged)>> 12/26 ETT >> 12/27 12/26 Rt femoral trialysis cath >>    CULTURES: 12/24 MRSA PCR>>>neg 12/24 BCx2>>>negative 12/26 C diff PCR >>>POS  ANTIBIOTICS: 12/25 IV Vanco>>>12/30 12/26 PO vanc >> 12/26  flagyl >> 12/26 Zosyn>>>12/27   SIGNIFICANT EVENTS:  12/19 - Admit post fall with L hip fracture 12/22 - L hip repair with intramedullary nailing of L femur (Dr. Carola Frost)  DVT Px: SCD's GI Px:  None indicated   INTERVAL HISTORY:  Feels better.  Denies chest pain.  Still intermittently confused.  VITAL SIGNS: Temp:  [97.5 F (36.4 C)-98.7 F (37.1 C)] 97.5 F (36.4 C) (12/30 1215) Pulse Rate:  [72-91] 91  (12/30 1215) Resp:  [16-26] 24  (12/30 1215) BP: (86-143)/(37-71) 107/50 mmHg (12/30 1215) SpO2:  [96 %-100 %] 100 % (12/30 0715) Weight:  [173 lb 8 oz (78.7 kg)-179 lb 0.2 oz (81.2 kg)] 173 lb 8 oz (78.7 kg) (12/30 0715)  INTAKE / OUTPUT: Intake/Output      12/29 0701 - 12/30 0700 12/30 0701 - 12/31 0700   I.V. (mL/kg) 418 (5.1)    IV Piggyback     Total Intake(mL/kg) 418 (5.1)    Other  1440   Stool 100    Total Output 100 1440   Net +318 -1440          PHYSICAL EXAMINATION: General: chronically ill female, no distress Neuro:  Awakens, follows commands,  interactive HEENT:  No sinus tenderness Cardiovascular:  s1s2 regular Lungs:  No wheeze Abdomen: soft, non tender Musculoskeletal:  No edema Skin:  No rashes   LABS: Cbc  Lab 05/28/12 0500 05/27/12 0430 05/26/12 0400  WBC 16.3* -- --  HGB 7.2* 7.0* 7.2*  HCT 22.6* 22.7* 23.1*  PLT 128* 161 124*   Chemistry  Lab 05/28/12 0500 05/27/12 0430 05/26/12 0400 05/24/12 0729  NA 135 133* 137 --  K 2.9* 3.7 3.5 --  CL 96 92* 96 --  CO2 23 19 18* --  BUN 38* 27* 57* --  CREATININE 5.88* 3.92* 8.31* --  CALCIUM 8.1* 8.0* 8.1* --  MG -- -- -- --  PHOS 3.5 3.3 -- 6.6*  GLUCOSE 155* 227* 167* --    CBG trend  Lab 05/28/12 1336 05/28/12 0411 05/27/12 2332 05/27/12 1941 05/27/12 1557  GLUCAP 166* 149* 144* 208* 332*     ASSESSMENT / PLAN:  PULMONARY  ASSESSMENT: Acute Respiratory failure - in setting of sepsis; resolved ? Hx COPD - no evidence of acute flare PLAN:   -O2 to support sats > 90%  CARDIOVASCULAR  ASSESSMENT:  Septic shock - resolved Sinus Tachycardia  - in setting of sepsis.  Resolved. Hypotension  - resolved. Hx HTN PLAN:  Monitor hemodynamics  RENAL  ASSESSMENT:  Chronic Renal Failure  - HD M/W/F PLAN:   -HD per renal  GASTROINTESTINAL  ASSESSMENT:   Rectal Bleeding - noted ER visit 12/19 with positive FOB, none noted this admit.  H/H stable. Nutrition. PLAN:   -monitor h/h -hold heparin products -renal diet  HEMATOLOGIC  ASSESSMENT:   Anemia - in setting of chronic disease PLAN:  -f/u CBC  INFECTIOUS  ASSESSMENT:   Severe Sepsis 2nd to C diff colitis -diffuse colitis on  abd / pelvis CT  PLAN:   -continue po flagyl, vancomycin for c diff  ENDOCRINE  ASSESSMENT:   DM PLAN:   -SSI  NEUROLOGIC  ASSESSMENT:   Acute Encephalopathy  - in setting of severe sepsis; improved PLAN:   -supportive care -monitor mental status  Updated husband at bedside.  Transfer to telemetry 12/30.  Transfer to [facility] 12/31 and PCCM sign  off.  Coralyn Helling, MD [facility] Pulmonary/Critical Care 05/28/2012, 2:33 PM Pager:  [phone] After 3pm call: [phone]

## 2012-05-29 DIAGNOSIS — N186 End stage renal disease: Secondary | ICD-10-CM | POA: Diagnosis not present

## 2012-05-29 LAB — RENAL FUNCTION PANEL
Albumin: 2.1 g/dL — ABNORMAL LOW (ref 3.5–5.2)
Chloride: 99 mEq/L (ref 96–112)
GFR calc Af Amer: 14 mL/min — ABNORMAL LOW (ref >90)
GFR calc non Af Amer: 12 mL/min — ABNORMAL LOW (ref >90)
Potassium: 3.4 mEq/L — ABNORMAL LOW (ref 3.5–5.1)
Sodium: 135 mEq/L (ref 135–145)

## 2012-05-29 LAB — CBC
HCT: 23.3 % — ABNORMAL LOW (ref 36.0–46.0)
Platelets: 135 10*3/uL — ABNORMAL LOW (ref 150–400)
RDW: 17.7 % — ABNORMAL HIGH (ref 11.5–15.5)
WBC: 14 10*3/uL — ABNORMAL HIGH (ref 4.0–10.5)

## 2012-05-29 LAB — GLUCOSE, CAPILLARY: Glucose-Capillary: 197 mg/dL — ABNORMAL HIGH (ref 70–99)

## 2012-05-29 MED ORDER — ACETAMINOPHEN 325 MG PO TABS
ORAL_TABLET | ORAL | Status: AC
  Filled 2012-05-29: qty 2

## 2012-05-29 MED ORDER — POTASSIUM CHLORIDE CRYS ER 20 MEQ PO TBCR
20.0000 meq | EXTENDED_RELEASE_TABLET | Freq: Once | ORAL | Status: AC
  Administered 2012-05-29: 20 meq via ORAL
  Filled 2012-05-29: qty 1

## 2012-05-29 MED ORDER — DARBEPOETIN ALFA-POLYSORBATE 200 MCG/0.4ML IJ SOLN
200.0000 ug | INTRAMUSCULAR | Status: DC
  Administered 2012-06-04: 200 ug via INTRAVENOUS
  Filled 2012-05-29: qty 0.4

## 2012-05-29 NOTE — Progress Notes (Signed)
[facility] PROGRESS NOTE  [name] ZOX:[id number] DOB: [birth year] DOA: 05/19/2012 PCP: Avon Gully, MD  Brief Narrative Pt is a 66 y/o female with ERSD admitted from [facility] s/p mechanical fall with L hip fracture and was transferred to [facility] - [facility] and ortho consulted for IM nailing 12/22. On 12/26 she was noted to have fever of 103, tachycardia, dyspnea and change in mental status concerning for sepsis. PCCM consulted for sepsis. Found to have c diff colitis & septic shock requiring mechanical ventialtion 12/26>>1227., was transferred back to [facility] today 12/31  Assessment/Plan:  1.L. Distal Femur Fracture, supracondylar -s/p IM nailing on 12/22 per Dr Carola Frost -PT OT recommending SNF 2.Acute Resp failure/sepsis with metabolic encephalopathy -intubated 12/26>12/27 per CCM, now resolved - was treated with broad spectrum abx, blood cultures to date with no growth -was found to be C.DIFF +>>source of sepsis>>improved on abx -continue oral vanc and flagyl 3.C.diff colitis -CT abd and pelvis with diffuse colitis -C. Diff + as above, clinically improving on oral vanc and flagyl 4.ESRD -dialysis per renal 5.Anemia -secondary to #4 -on aranesp, she is Jehovah's witness>> no blood transfusion 6.HTN -oupt meds on hold for now as BPs improved but still soft, monitor resume when appropriate 7.Decondtioning -PT recommending SNF as above, SW consulted 8.Hypokalemia -replace k Code Status: Full Family Communication: directly with pt at bedside Disposition Plan: SNF when medically ready   Consultants:  CCM  ortho  Procedures: 12/19 IJ Tunneled Cath (exchanged)>>  12/22 IM nailing per ortho 12/26 ETT >> 12/27  12/26 Rt femoral dialysis cath >>    Antibiotics: 12/25 Vanco>>> 12/30 12/26 PO vanc >>  12/26 flagyl >>  12/26 Zosyn>>>12/27   HPI/Subjective: Pt seen today in dialysis, dneies diarrhea, no CP and denies N/V. Chart reviewed  Objective: Filed Vitals:   05/28/12  1554 05/28/12 2056 05/29/12 0430 05/29/12 0924  BP: 127/56 110/32 122/56 103/45  Pulse:  102 96 84  Temp: 98.3 F (36.8 C) 98.1 F (36.7 C) 98.4 F (36.9 C) 98.7 F (37.1 C)  TempSrc: Oral Oral Oral   Resp: 20 24 20 18   Height:      Weight:  78.8 kg (173 lb 11.6 oz)    SpO2: 100% 95% 98% 100%    Intake/Output Summary (Last 24 hours) at 05/29/12 1110 Last data filed at 05/29/12 0925  Gross per 24 hour  Intake    300 ml  Output   1440 ml  Net  -1140 ml   Filed Weights   05/28/12 0458 05/28/12 0715 05/28/12 2056  Weight: 81.2 kg (179 lb 0.2 oz) 78.7 kg (173 lb 8 oz) 78.8 kg (173 lb 11.6 oz)    Exam:   General:  Elderly female, alert and answers appropriately, in NAD  Cardiovascular: RRR, nl S1S2  Respiratory: CTAB, no wheezes   Abdomen: soft, +BS, NT/ND  Extremities: no edema, no cyanosis  Data Reviewed: Basic Metabolic Panel:  Lab 05/29/12 8119 05/28/12 1654 05/28/12 0500 05/27/12 0430 05/26/12 0400 05/24/12 0729  NA 135 140 135 133* 137 --  K 3.4* 3.6 2.9* 3.7 3.5 --  CL 99 100 96 92* 96 --  CO2 26 24 23 19  18* --  GLUCOSE 239* 203* 155* 227* 167* --  BUN 20 13 38* 27* 57* --  CREATININE 3.64* 2.58* 5.88* 3.92* 8.31* --  CALCIUM 8.1* 8.1* 8.1* 8.0* 8.1* --  MG -- 1.9 -- -- -- --  PHOS 2.6 -- 3.5 3.3 -- 6.6*   Liver Function Tests:  Lab  05/29/12 0535 05/28/12 0500 05/27/12 0430 05/24/12 0729  AST -- -- -- --  ALT -- -- -- --  ALKPHOS -- -- -- --  BILITOT -- -- -- --  PROT -- -- -- --  ALBUMIN 2.1* 2.1* 1.9* 2.1*   No results found for this basename: LIPASE:5,AMYLASE:5 in the last 168 hours No results found for this basename: AMMONIA:5 in the last 168 hours CBC:  Lab 05/29/12 0535 05/28/12 1654 05/28/12 0500 05/27/12 0430 05/26/12 0400 05/24/12 0729  WBC 14.0* 13.5* 16.3* 22.6* 26.5* --  NEUTROABS -- -- -- -- 23.6* 30.4*  HGB 7.3* 7.8* 7.2* 7.0* 7.2* --  HCT 23.3* 25.0* 22.6* 22.7* 23.1* --  MCV 82.3 81.2 80.7 82.5 82.5 --  PLT 135* 128* 128*  161 124* --   Cardiac Enzymes: No results found for this basename: CKTOTAL:5,CKMB:5,CKMBINDEX:5,TROPONINI:5 in the last 168 hours BNP (last 3 results) No results found for this basename: PROBNP:3 in the last 8760 hours CBG:  Lab 05/29/12 0802 05/29/12 0359 05/29/12 05/28/12 2015 05/28/12 1650  GLUCAP 200* 254* 197* 275* 194*    Recent Results (from the past 240 hour(s))  SURGICAL PCR SCREEN     Status: Normal   Collection Time   05/20/12  1:45 AM      Component Value Range Status Comment   MRSA, PCR NEGATIVE  NEGATIVE Final    Staphylococcus aureus NEGATIVE  NEGATIVE Final   CULTURE, BLOOD (ROUTINE X 2)     Status: Normal   Collection Time   05/22/12  9:34 AM      Component Value Range Status Comment   Specimen Description BLOOD HAND RIGHT   Final    Special Requests BOTTLES DRAWN AEROBIC AND ANAEROBIC 10CC EACH   Final    Culture  Setup Time 05/22/2012 18:15   Final    Culture NO GROWTH 5 DAYS   Final    Report Status 05/28/2012 FINAL   Final   CULTURE, BLOOD (ROUTINE X 2)     Status: Normal   Collection Time   05/22/12  9:42 AM      Component Value Range Status Comment   Specimen Description BLOOD HAND RIGHT   Final    Special Requests BOTTLES DRAWN AEROBIC AND ANAEROBIC 10CC EACH   Final    Culture  Setup Time 05/22/2012 18:15   Final    Culture NO GROWTH 5 DAYS   Final    Report Status 05/28/2012 FINAL   Final   MRSA PCR SCREENING     Status: Normal   Collection Time   05/24/12  2:50 PM      Component Value Range Status Comment   MRSA by PCR NEGATIVE  NEGATIVE Final   CLOSTRIDIUM DIFFICILE BY PCR     Status: Abnormal   Collection Time   05/24/12  2:53 PM      Component Value Range Status Comment   C difficile by pcr POSITIVE (*) NEGATIVE Final      Studies: No results found.  Scheduled Meds:   . darbepoetin (ARANESP) injection - DIALYSIS  100 mcg Intravenous Q Mon-HD  . ferric gluconate (FERRLECIT/NULECIT) IV  125 mg Intravenous Q M,W,F-HD  . heparin  7,600  Units Dialysis Once in dialysis  . insulin aspart  0-15 Units Subcutaneous Q4H  . levothyroxine  75 mcg Oral QAC breakfast  . metroNIDAZOLE  500 mg Oral Q8H  . multivitamin  1 tablet Oral Daily  . niacin  500 mg Oral Daily  . paricalcitol  1 mcg Intravenous 3 times weekly  . sodium chloride  250 mL Intravenous Once  . vancomycin  500 mg Oral Q6H   Continuous Infusions:   . sodium chloride 20 mL/hr at 05/24/12 1731    Principal Problem:  *Acute respiratory failure Active Problems:  ANEMIA OF RENAL FAILURE  HYPERTENSION  RENAL DISEASE, CHRONIC, STAGE V  Femur fracture, left  Fall at home  Fever  Severe sepsis  Sepsis with metabolic encephalopathy  Clostridium difficile colitis  Septic shock(785.52)    Time spent:15mins    Kela Millin  [facility] Pager [phone] If 8PM-8AM, please contact night-coverage at [email or web address], password [facility] 05/29/2012, 11:10 AM  LOS: 10 days

## 2012-05-29 NOTE — Progress Notes (Signed)
Agree with above. Will follow.  05/29/2012 Cephus Shelling, PT, DPT [phone]

## 2012-05-29 NOTE — Procedures (Signed)
I was present at this dialysis session. I have reviewed the session itself and made appropriate changes.   Vinson Moselle, MD [employer] 05/29/2012, 2:52 PM

## 2012-05-29 NOTE — Progress Notes (Signed)
Hemodialysis-See flowsheet. Pt received 3.5 of 4 hour treatment. Signed off AMA d/t being uncomfortable in bed. [facility] notified. Risks explained to pt.

## 2012-05-29 NOTE — Progress Notes (Signed)
Orthopaedic Trauma Service (OTS)  Subjective: 9 Days Post-Op Procedure(s) (LRB): INTRAMEDULLARY (IM) RETROGRADE FEMORAL NAILING (Left)    Appears to be doing well this am More alert Conversant No new ortho issues  Worked with PT yesterday   Objective: Current Vitals Blood pressure 122/56, pulse 96, temperature 98.4 F (36.9 C), temperature source Oral, resp. rate 20, height 5\' 3"  (1.6 m), weight 78.8 kg (173 lb 11.6 oz), SpO2 98.00%. Vital signs in last 24 hours: Temp:  [97.5 F (36.4 C)-98.5 F (36.9 C)] 98.4 F (36.9 C) (12/31 0430) Pulse Rate:  [80-102] 96  (12/31 0430) Resp:  [18-26] 20  (12/31 0430) BP: (86-129)/(32-63) 122/56 mmHg (12/31 0430) SpO2:  [95 %-100 %] 98 % (12/31 0430) Weight:  [78.8 kg (173 lb 11.6 oz)] 78.8 kg (173 lb 11.6 oz) (12/30 2056)  Intake/Output from previous day: 12/30 0701 - 12/31 0700 In: 240 [P.O.:240] Out: 1440   Intake/Output      12/30 0701 - 12/31 0700 12/31 0701 - 01/01 0700   P.O. 240    I.V. (mL/kg)     Total Intake(mL/kg) 240 (3)    Other 1440    Stool     Total Output 1440    Net -1200         Urine Occurrence        LABS  Basename 05/29/12 0535 05/28/12 1654 05/28/12 0500 05/27/12 0430  HGB 7.3* 7.8* 7.2* 7.0*    Basename 05/29/12 0535 05/28/12 1654  WBC 14.0* 13.5*  RBC 2.83* 3.08*  HCT 23.3* 25.0*  PLT 135* 128*    Basename 05/29/12 0535 05/28/12 1654  NA 135 140  K 3.4* 3.6  CL 99 100  CO2 26 24  BUN 20 13  CREATININE 3.64* 2.58*  GLUCOSE 239* 203*  CALCIUM 8.1* 8.1*   No results found for this basename: LABPT:2,INR:2 in the last 72 hours    Physical Exam  Gen: Awake, more alert, NAD Lungs:occ basilar crackles Cardiac:s1 and s Abd: + BS, NT Ext:      Left Lower Extremity  Incisions look fantastic  Swelling stable  DPN, SPN, TN sensation intact  EHL, FHL, AT, PT, Peroneals, gastroc motor intact  Ext warm  + DP pulse  Compartments soft and NT  No DCT   Imaging No results  found.  Assessment/Plan: 9 Days Post-Op Procedure(s) (LRB): INTRAMEDULLARY (IM) RETROGRADE FEMORAL NAILING (Left)  66 y/o female s/p fall   1. Fall  2. L supracondylar distal femur fracture POD 9    NWB   Unrestricted L knee ROM   Continue with PT/OT  No pillows directly under knee, float/elevate leg by placing pillows under ankle   Float heels off bed, pt at risk for pressure ulcers   Dressing changes prn   Dressing to blister anterior tibia  3. Sepsis/ C.diff colitis   resolving  Per critical care/IM  4. ESRD   Per nephrology  5. ABL anemia   Stable   Pt is a Jehovah's Witness  6. Dvt/pe prophylaxis   Holding pharmacologics due to + FOB on 12/19   SCD's  7. Dispo   Continue with therapies  Will need SNF at d/c given profound deconditioning and need for intensive therapies  Ortho issues stable  Mearl Latin, PA-C [facility] [phone] (P) 05/29/2012, 8:02 AM

## 2012-05-29 NOTE — Progress Notes (Signed)
[facility] ROUNDING NOTE   Subjective:   Interval Hx: on dialysis, no complaints  Objective:  Vital signs in last 24 hours:  Temp:  [98.1 F (36.7 C)-98.7 F (37.1 C)] 98.4 F (36.9 C) (12/31 1200) Pulse Rate:  [76-102] 76  (12/31 1400) Resp:  [18-24] 18  (12/31 1330) BP: (103-141)/(32-64) 120/39 mmHg (12/31 1400) SpO2:  [95 %-100 %] 100 % (12/31 1330) Weight:  [78.8 kg (173 lb 11.6 oz)-78.9 kg (173 lb 15.1 oz)] 78.9 kg (173 lb 15.1 oz) (12/31 1200)  Weight change: -2.5 kg (-5 lb 8.2 oz) Filed Weights   05/28/12 0715 05/28/12 2056 05/29/12 1200  Weight: 78.7 kg (173 lb 8 oz) 78.8 kg (173 lb 11.6 oz) 78.9 kg (173 lb 15.1 oz)    Intake/Output: I/O last 3 completed shifts: In: 480 [P.O.:240; I.V.:240] Out: 1440 [Other:1440]   Intake/Output this shift:  Total I/O In: 60 [P.O.:60] Out: -   Basic Metabolic Panel:  Lab 05/29/12 1610 05/28/12 1654 05/28/12 0500 05/27/12 0430 05/26/12 0400 05/24/12 0729  NA 135 140 135 133* 137 --  K 3.4* 3.6 2.9* 3.7 3.5 --  CL 99 100 96 92* 96 --  CO2 26 24 23 19  18* --  GLUCOSE 239* 203* 155* 227* 167* --  BUN 20 13 38* 27* 57* --  CREATININE 3.64* 2.58* 5.88* 3.92* 8.31* --  CALCIUM 8.1* 8.1* 8.1* -- -- --  MG -- 1.9 -- -- -- --  PHOS 2.6 -- 3.5 3.3 -- 6.6*    Liver Function Tests:  Lab 05/29/12 0535 05/28/12 0500 05/27/12 0430 05/24/12 0729  AST -- -- -- --  ALT -- -- -- --  ALKPHOS -- -- -- --  BILITOT -- -- -- --  PROT -- -- -- --  ALBUMIN 2.1* 2.1* 1.9* 2.1*   No results found for this basename: LIPASE:5,AMYLASE:5 in the last 168 hours No results found for this basename: AMMONIA:3 in the last 168 hours  CBC:  Lab 05/29/12 0535 05/28/12 1654 05/28/12 0500 05/27/12 0430 05/26/12 0400 05/24/12 0729  WBC 14.0* 13.5* 16.3* 22.6* 26.5* --  NEUTROABS -- -- -- -- 23.6* 30.4*  HGB 7.3* 7.8* 7.2* 7.0* 7.2* --  HCT 23.3* 25.0* 22.6* 22.7* 23.1* --  MCV 82.3 81.2 80.7 82.5 82.5 --  PLT 135* 128* 128* 161 124*  --    Cardiac Enzymes: No results found for this basename: CKTOTAL:5,CKMB:5,CKMBINDEX:5,TROPONINI:5 in the last 168 hours  BNP: No components found with this basename: POCBNP:5  CBG:  Lab 05/29/12 0802 05/29/12 0359 05/29/12 05/28/12 2015 05/28/12 1650  GLUCAP 200* 254* 197* 275* 194*    Microbiology: Results for orders placed during the hospital encounter of 05/19/12  SURGICAL PCR SCREEN     Status: Normal   Collection Time   05/20/12  1:45 AM      Component Value Range Status Comment   MRSA, PCR NEGATIVE  NEGATIVE Final    Staphylococcus aureus NEGATIVE  NEGATIVE Final   CULTURE, BLOOD (ROUTINE X 2)     Status: Normal   Collection Time   05/22/12  9:34 AM      Component Value Range Status Comment   Specimen Description BLOOD HAND RIGHT   Final    Special Requests BOTTLES DRAWN AEROBIC AND ANAEROBIC 10CC EACH   Final    Culture  Setup Time 05/22/2012 18:15   Final    Culture NO GROWTH 5 DAYS   Final    Report Status 05/28/2012 FINAL   Final  CULTURE, BLOOD (ROUTINE X 2)     Status: Normal   Collection Time   05/22/12  9:42 AM      Component Value Range Status Comment   Specimen Description BLOOD HAND RIGHT   Final    Special Requests BOTTLES DRAWN AEROBIC AND ANAEROBIC 10CC EACH   Final    Culture  Setup Time 05/22/2012 18:15   Final    Culture NO GROWTH 5 DAYS   Final    Report Status 05/28/2012 FINAL   Final   MRSA PCR SCREENING     Status: Normal   Collection Time   05/24/12  2:50 PM      Component Value Range Status Comment   MRSA by PCR NEGATIVE  NEGATIVE Final   CLOSTRIDIUM DIFFICILE BY PCR     Status: Abnormal   Collection Time   05/24/12  2:53 PM      Component Value Range Status Comment   C difficile by pcr POSITIVE (*) NEGATIVE Final     Coagulation Studies: No results found for this basename: LABPROT:5,INR:5 in the last 72 hours  Urinalysis: No results found for this basename:  COLORURINE:2,APPERANCEUR:2,LABSPEC:2,PHURINE:2,GLUCOSEU:2,HGBUR:2,BILIRUBINUR:2,KETONESUR:2,PROTEINUR:2,UROBILINOGEN:2,NITRITE:2,LEUKOCYTESUR:2 in the last 72 hours    Imaging: No results found.   Medications:      . sodium chloride 20 mL/hr at 05/24/12 1731      . darbepoetin (ARANESP) injection - DIALYSIS  100 mcg Intravenous Q Mon-HD  . ferric gluconate (FERRLECIT/NULECIT) IV  125 mg Intravenous Q M,W,F-HD  . heparin  7,600 Units Dialysis Once in dialysis  . insulin aspart  0-15 Units Subcutaneous Q4H  . levothyroxine  75 mcg Oral QAC breakfast  . metroNIDAZOLE  500 mg Oral Q8H  . multivitamin  1 tablet Oral Daily  . niacin  500 mg Oral Daily  . paricalcitol  1 mcg Intravenous 3 times weekly  . sodium chloride  250 mL Intravenous Once  . vancomycin  500 mg Oral Q6H   sodium chloride, sodium chloride, sodium chloride, sodium chloride, acetaminophen, feeding supplement (NEPRO CARB STEADY), feeding supplement (NEPRO CARB STEADY), heparin, heparin, heparin, heparin, lidocaine, lidocaine, lidocaine-prilocaine, lidocaine-prilocaine, menthol-cetylpyridinium, methocarbamol, metoCLOPramide, ondansetron, oxyCODONE, pentafluoroprop-tetrafluoroeth, pentafluoroprop-tetrafluoroeth, phenol, sorbitol Exam: Awake and alert chronically ill female CVS- RRR  RS- CTA basal rales mild respiratory discomfort to conversation ABD- BS hypoactive but soft non-distended  EXT- no edema  L hip dressing c/d/i, L ankle wrapped with ace wrap  Dialysis Orders: Center: [place] on MWF. EDW 76.5 kg HD Bath 2K/2.25Ca Time 4 hrs Heparin 7600-U bolus, 2000-U mid HD. Access Left IJ catheter BFR 400 DFR 800 Zemplar 1 mcg IV/HD Epogen 0 Units IV/HD Venofer 100 mg per HD.   Assessment/ Plan:  1. S/p L distal femur fx w ORIF 12/22 by Dr. Carola Frost 2. Debility- turned down by CIR, plan is for SNF placement 3. ESRD on hd mwf via IJ cath. Blood cx's negative, no evidence of tunneled HD cath infection. Plan HD  Mon-Tues-Fri this week due to holiday.  4. Shock, due to sepsis ? From Cdif- resolved, off pressors x 48 hrs 5. Cdif+- improved diarrhea 6. HTN/volume- 3kg over dry weight, UF 3 kg as tolerated today. Was on lisinopril 20/hs, doxazosin 8/hs and amlodipine 5/d at home; all are on hold.  BP still on lower side. 7. VDRF- resolved 8. AMS- due to sepsis, resolved for the most part 9. 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 No binders or sensipar listed on HD center med list 10. Anemia of CKD- getting darbe 100/Mon in place  of EPO, will increase to 200ug/week. Hb 7.2, Jehovah's Witness will not take transfusion of blood products  Vinson Moselle  MD [facility] [phone] pgr    [phone] cell 05/29/2012, 2:12 PM

## 2012-05-29 NOTE — Progress Notes (Signed)
OT/ PT Cancellation Note  Patient Details Name: TALECIA SHERLIN MRN: [id number] DOB: [birth year]   Cancelled Treatment:    Reason Eval/Treat Not Completed: Patient at procedure or test/ unavailable (Pt transfer to HD at this time. ) Pt is participating in HD today due to New Years Holiday Wednesday. Pt will return to normal MWF schedule 06/02/11 Friday.  Harrel Carina [place] Pager: [phone]  05/29/2012, 11:40 AM

## 2012-05-30 ENCOUNTER — Encounter (HOSPITAL_COMMUNITY): Payer: Self-pay | Admitting: General Practice

## 2012-05-30 DIAGNOSIS — N186 End stage renal disease: Secondary | ICD-10-CM

## 2012-05-30 LAB — BASIC METABOLIC PANEL
CO2: 22 mEq/L (ref 19–32)
Calcium: 7.6 mg/dL — ABNORMAL LOW (ref 8.4–10.5)
Chloride: 103 mEq/L (ref 96–112)
Creatinine, Ser: 2.95 mg/dL — ABNORMAL HIGH (ref 0.50–1.10)
Glucose, Bld: 142 mg/dL — ABNORMAL HIGH (ref 70–99)

## 2012-05-30 LAB — CBC
HCT: 23.4 % — ABNORMAL LOW (ref 36.0–46.0)
MCH: 25.5 pg — ABNORMAL LOW (ref 26.0–34.0)
MCV: 83 fL (ref 78.0–100.0)
Platelets: 152 10*3/uL (ref 150–400)
RDW: 18.1 % — ABNORMAL HIGH (ref 11.5–15.5)

## 2012-05-30 LAB — GLUCOSE, CAPILLARY: Glucose-Capillary: 143 mg/dL — ABNORMAL HIGH (ref 70–99)

## 2012-05-30 MED ORDER — PRO-STAT SUGAR FREE PO LIQD
30.0000 mL | Freq: Two times a day (BID) | ORAL | Status: DC
  Administered 2012-05-30 – 2012-06-04 (×8): 30 mL via ORAL
  Filled 2012-05-30: qty 30
  Filled 2012-05-30: qty 60
  Filled 2012-05-30 (×9): qty 30

## 2012-05-30 MED ORDER — NEPRO/CARBSTEADY PO LIQD
237.0000 mL | Freq: Two times a day (BID) | ORAL | Status: DC
  Administered 2012-05-30 – 2012-06-04 (×7): 237 mL via ORAL

## 2012-05-30 NOTE — Progress Notes (Signed)
[facility] Progress Note  Subjective:   Felt bad on HD yesterday.  Her whole left side hurt.  Objective Filed Vitals:   05/29/12 1545 05/29/12 1628 05/29/12 2140 05/30/12 0401  BP: 104/58 108/63 106/55 102/63  Pulse: 76 84 93 94  Temp: 98.6 F (37 C) 98.7 F (37.1 C) 98.6 F (37 C) 98.2 F (36.8 C)  TempSrc: Oral  Oral Oral  Resp: 13 16 16 16   Height:      Weight:   81.285 kg (179 lb 3.2 oz)   SpO2: 100% 100% 100% 100%   Physical Exam General: Ill appearing Heart: RRR Lungs: no wheezes or rales Abdomen: soft Extremities: triple lumen catheter in right groin; tr LE edema Dialysis Access: left I-J Dialysis Orders: Center: [place] on MWF. EDW 76.5 kg HD Bath 2K/2.25Ca Time 4 hrs Heparin 7600-U bolus, 2000-U mid HD. Access Left IJ catheter BFR 400 DFR 800 Zemplar 1 mcg IV/HD Epogen 0 Units IV/HD Venofer 100 mg per HD.   Assessment/Plan: 1. S/p L distal femur fx w ORIF 12/22 by Dr. Carola Frost 2. Debility- turned down by CIR, plan is for SNF placement 3. ESRD on hd mwf via IJ cath. Blood cx's negative, no evidence of tunneled HD cath infection. Plan HD Mon-Tues-Fri this week due to holiday - next HD due Friday. 4. Shock, due to sepsis ? From Cdif- resolved 5. Cdif+- improved diarrhea; on po Vanc 6. HTN/volume- UF 3 L yesterday  Was on lisinopril 20/hs, doxazosin 8/hs and amlodipine 5/d at home; all are on hold. BP still on low side. 7. VDRF- resolved 8. AMS- due to sepsis, resolved for the most part 9. 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 No binders or sensipar listed on HD center med list P <3 10. Anemia of CKD- Aranesp increased to 200ug/week. Hb 7.2- stable, Jehovah's Witness will not take transfusion of blood products - on q HD IV Fe 11. Nutrition - poor - changed to high protein diet and have added nepro and prostat  Sheffield Slider, PA-C [facility] [phone] 05/30/2012,10:56 AM  LOS: 11 days   Patient seen and examined and agree with assessment and plan  as above.  Sheila Moselle  MD [facility] [phone] pgr    [phone] cell 05/30/2012, 2:42 PM   Additional Objective Labs: Basic Metabolic Panel:  Lab 05/30/12 4696 05/29/12 0535 05/28/12 1654 05/28/12 0500 05/27/12 0430  NA 138 135 140 -- --  K 3.7 3.4* 3.6 -- --  CL 103 99 100 -- --  CO2 22 26 24  -- --  GLUCOSE 142* 239* 203* -- --  BUN 13 20 13  -- --  CREATININE 2.95* 3.64* 2.58* -- --  CALCIUM 7.6* 8.1* 8.1* -- --  ALB -- -- -- -- --  PHOS -- 2.6 -- 3.5 3.3   Liver Function Tests:  Lab 05/29/12 0535 05/28/12 0500 05/27/12 0430  AST -- -- --  ALT -- -- --  ALKPHOS -- -- --  BILITOT -- -- --  PROT -- -- --  ALBUMIN 2.1* 2.1* 1.9*  CBC:  Lab 05/30/12 0615 05/29/12 0535 05/28/12 1654 05/28/12 0500 05/27/12 0430 05/26/12 0400 05/24/12 0729  WBC 13.0* 14.0* 13.5* -- -- -- --  NEUTROABS -- -- -- -- -- 23.6* 30.4*  HGB 7.2* 7.3* 7.8* -- -- -- --  HCT 23.4* 23.3* 25.0* -- -- -- --  MCV 83.0 82.3 81.2 80.7 82.5 -- --  PLT 152 135* 128* -- -- -- --  Blood Culture    Component Value Date/Time   SDES BLOOD HAND RIGHT 05/22/2012 0942   SPECREQUEST BOTTLES DRAWN AEROBIC AND ANAEROBIC 10CC EACH 05/22/2012 0942   CULT NO GROWTH 5 DAYS 05/22/2012 0942   REPTSTATUS 05/28/2012 FINAL 05/22/2012 0942  CBG:  Lab 05/30/12 0813 05/30/12 0400 05/30/12 0100 05/29/12 2202 05/29/12 1630  GLUCAP 151* 143* 201* 201* 148*  Medications:    . sodium chloride 20 mL/hr at 05/24/12 1731      . darbepoetin (ARANESP) injection - DIALYSIS  200 mcg Intravenous Q Mon-HD  . ferric gluconate (FERRLECIT/NULECIT) IV  125 mg Intravenous Q M,W,F-HD  . insulin aspart  0-15 Units Subcutaneous Q4H  . levothyroxine  75 mcg Oral QAC breakfast  . multivitamin  1 tablet Oral Daily  . niacin  500 mg Oral Daily  . paricalcitol  1 mcg Intravenous 3 times weekly  . sodium chloride  250 mL Intravenous Once  . vancomycin  500 mg Oral Q6H

## 2012-05-30 NOTE — Progress Notes (Signed)
[facility] PROGRESS NOTE  [name]:[id number] DOB: 05/13/1946 DOA: 05/19/2012 PCP: Avon Gully, MD  Brief Narrative  Pt is a 67 y/o female with ERSD admitted from [facility] s/p mechanical fall with L hip fracture and was transferred to [facility] and ortho consulted for IM nailing 12/22. On 12/26 she was noted to have fever of 103, tachycardia, dyspnea and change in mental status concerning for sepsis. PCCM consulted for sepsis. Found to have c diff colitis & septic shock requiring mechanical ventialtion 12/26>>1227., was transferred back to [facility] today 12/31   Assessment/Plan: L. Distal Femur Fracture, supracondylar  -s/p IM nailing on 12/22 per Dr Carola Frost  -PT OT recommending SNF  Acute Resp failure/sepsis with metabolic encephalopathy  -intubated 12/26>12/27 per CCM, now resolved  - resovled -was found to be C.DIFF +>>source of sepsis>>improved on abx  -continue oral vanc -Initial procalcitonin 17.02, lactic acid 1.1 -Blood cultures negative C.diff colitis  -CT abd and pelvis with diffuse colitis--no perf or abscess -clinically improving -Discontinue oral Flagyl at this time, patient tolerating diet, no signs of ileus -Continue oral vancomycin every 6 hours, plan 10 days total -PO vanc 12/26>>> -IV Flagyl 12/26>>>12/29 -PO Flagyl 12/29>>>05/30/12 -Repeat CBC in the morning  ESRD  -dialysis per renal  Anemia  -secondary to #4  -on aranesp, she is Jehovah's witness>> no blood transfusion  HTN  -oupt meds on hold for now as BPs improved but still soft, monitor resume when appropriate  -BPs remain soft, SBP low 100s Decondtioning  -PT recommending SNF as above, SW consulted  Hypokalemia  -replaced Hypothyroidism -Continue Synthroid Diabetes mellitus type 2 -Continue NovoLog sliding scale -Hemoglobin A1c 7.4 -Patient takes glipizide at home Code Status: Full  Family Communication: directly with pt at bedside  Disposition Plan:  D/C when bed available at  SNF      Procedures/Studies: Ct Abdomen Pelvis Wo Contrast  05/24/2012  *RADIOLOGY REPORT*  Clinical Data: Rectal bleeding, recent abnormal CT with sigmoid wall thickening, new sepsis  CT ABDOMEN AND PELVIS WITHOUT CONTRAST  Technique:  Multidetector CT imaging of the abdomen and pelvis was performed following the standard protocol without intravenous contrast. Small amount of GI contrast is present.  Sagittal and coronal MPR images reconstructed from axial data set.  Comparison: 05/17/2012  Findings: Mild atelectasis left lung base. Right femoral line, tip IVC. Nasogastric tube in stomach. Minimal pericardial effusion Slightly increased. Streak artifacts from the patient's arms traverse the upper abdomen. Low to intermediate attenuation lesion within the anterior 5.1 x 4.0 cm image 30.  Within limits of a nonenhanced exam, no focal abnormalities of the liver, spleen, pancreas, kidneys or adrenal glands otherwise identified. Atherosclerotic calcifications without aneurysm. Large and small bowel loops are underdistended making assessment of wall thickness suboptimal. However there appears to be diffuse wall thickening of the colon with adjacent pericolic infiltration and multiple sites question colitis. Minimal sigmoid diverticulosis incidentally noted.  Bladder partially distended, cystocele noted. Probable 10 mm uterine leiomyoma at fundus. Scattered normal-sized mesenteric nodes medial to right colon. No additional mass, adenopathy, free fluid, or free air. Bones appear diffusely demineralized.  IMPRESSION: Probable diffuse colitis; differential diagnosis includes infection, inflammatory bowel disease, and ischemia; when I called results to the patient's nurse, he indicated a new positive laboratory test for Clostridium difficile, which would account for this finding. Minimal pericardial effusion, increased. Remainder of exam unchanged from prior recent CT.  Findings called to [facility] on 2100 on  05/24/2012 at 1653 hours.   Original Report Authenticated  By: Ulyses Southward, M.D.    Ct Abdomen Pelvis Wo Contrast  05/17/2012  *RADIOLOGY REPORT*  Clinical Data: Rectal bleeding and lower abdominal pain.  CT ABDOMEN AND PELVIS WITHOUT CONTRAST  Technique:  Multidetector CT imaging of the abdomen and pelvis was performed following the standard protocol without intravenous contrast.  Comparison: CT abdomen pelvis 08/19/2009  Findings: At the lung base, heavy coronary artery atherosclerotic calcification is noted.  The heart is not completely imaged.  The visualized lungs are clear.  The noncontrast appearance of the liver, spleen, adrenal glands, and pancreas is within normal limits.  A 4.5 x 3.7 centimeter water density lesion in the mid pole of the left kidney statistically most likely a renal cyst, but not completely characterized on noncontrast CT.  There are multiple hyperdense rounded lesions associated with the left renal cortex, measuring in the range of 7- 8 mm, which may be hyperdense cysts.  A few calcifications associated with both kidneys are favored to be vascular calcifications.  There is no hydronephrosis.  A hyperdense lesion associated the lower pole of the right kidney measures 10 mm.  There is extensive atherosclerotic calcification of the aortoiliac vasculature.  There is infrarenal abdominal aortic ectasia measuring up to 2.2 cm AP diameter.  Negative for aneurysm.  The appendix is identified, and courses posterior to the cecum. Proximally, the appendix measures 8-9 mm in diameter, mildly prominent.  The distal tip of the appendix measures 5 mm and contains a small locule of gas within its lumen.  The proximal aspect of the appendix does not contain gas.  No periappendiceal inflammatory stranding is appreciated.  There is extensive diverticulosis of the sigmoid colon.  There is some sigmoid wall thickening (for example image number 58).  No discrete inflammatory stranding is seen in association  with the diverticula.  There is a moderate amount of stool in the colon. There is no evidence of bowel obstruction.  The uterus is retroverted and contains a few coarse calcifications, suggesting the presence of uterine fibroids.  Urinary bladder is unremarkable.  There is degenerative disc disease at L5-S1.  Vertebral bodies are normal in height and alignment.  IMPRESSION:  1.  Early appendicitis cannot be excluded, but is not definite. The proximal appendix measures prominent in caliber, up to 8 to 9 mm, but it is noted that the proximal appendix measured approximately 8 mm on the prior CT of March 2011.  No periappendiceal inflammatory stranding is seen.  Suggest clinical correlation, and follow-up imaging as warranted. 2.  Diverticulosis of the sigmoid colon without definite CT evidence of acute diverticulitis.  In the region of the diverticula, there is suggestion of sigmoid colon wall thickening, which may be secondary to muscular hypertrophy related to diverticulosis.  Acute colitis or underlying neoplasm cannot be excluded. 3.  4.6 cm water density left renal mass.  This is likely renal cyst.  This could be confirmed with renal ultrasound is desired. 4.  Several small bilateral, left greater than right, high density renal lesions likely reflect complex renal cysts.  These cannot be completely characterized on noncontrast CT. 5.  Aortoiliac atherosclerosis with ectasia of the infrarenal abdominal aorta.   Original Report Authenticated By: Britta Mccreedy, M.D.    Dg Chest 1 View  05/19/2012  *RADIOLOGY REPORT*  Clinical Data: Larey Seat from bed  CHEST - 1 VIEW  Comparison: 02/27/2012  Findings: Left jugular dual-lumen central venous catheter tip projecting over cavoatrial junction. Enlargement of cardiac silhouette with pulmonary vascular congestion. Mild diffuse  increased interstitial markings in both lungs question minimal edema. No segmental consolidation, pleural effusion or pneumothorax. Bones demineralized.   IMPRESSION: Enlargement of cardiac silhouette with pulmonary vascular congestion. Question minimal pulmonary edema.   Original Report Authenticated By: Ulyses Southward, M.D.    Dg Femur Left  05/20/2012  *RADIOLOGY REPORT*  Clinical Data: Post retrograde line and nail of the left femur  DG C-ARM 61-120 MIN, LEFT FEMUR - 2 VIEW  Comparison:  Left femur radiographs - 05/19/2012  Findings:  Six spot intraoperative fluoroscopic images of the left femur are provided for review.  Images demonstrate retrograde intramedullary nail fixation of previously noted distal diaphyseal/metaphyseal comminuted femur fracture. Alignment is much improved.  The distal end of the intramedullary rod is transfixed with three cancellous screws.  The proximal end of the femoral rods transfixed with two cancellous screws.  Skin staples overlying the proximal thigh. No radiopaque foreign body.  IMPRESSION: Post intramedullary rod fixation of comminuted distal diaphyseal/metaphyseal femoral fracture.   Original Report Authenticated By: Tacey Ruiz, MD    Dg Femur Left  05/19/2012  *RADIOLOGY REPORT*  Clinical Data: Fall  LEFT FEMUR - 2 VIEW  Comparison: None.  Findings: Three views of the left femur submitted.  There is displaced comminuted fracture in distal left femoral shaft.  IMPRESSION: Displaced comminuted fracture distal left femoral shaft.   Original Report Authenticated By: Natasha Mead, M.D.    Dg Tibia/fibula Left  05/19/2012  *RADIOLOGY REPORT*  Clinical Data: Fall  LEFT TIBIA AND FIBULA - 2 VIEW  Comparison: None.  Findings: Two views of the left tibia-fibula submitted.  No acute fracture or subluxation.  Atherosclerotic vascular calcifications are noted.  IMPRESSION: No acute fracture or subluxation.   Original Report Authenticated By: Natasha Mead, M.D.    Ir Fluoro Guide Cv Line Right  05/17/2012  *RADIOLOGY REPORT*  Clinical data:  End-stage renal disease.  Poor function of tunneled left IJ hemodialysis catheter.   CATHETER INJECTION UNDER FLUOROSCOPY EXCHANGE OF TUNNELED HEMODIALYSIS CENTRAL VENOUS CATHETER  Technique and findings:  Initially, water-soluble contrast was injected through both lumens of the previously placed tunneled left IJ hemodialysis catheter sequentially.  This demonstrated position of the tip of the catheter in the IVC near hepatic vein inflows.  There is occlusion of the distal aspect of the blue venous return lumen.  The catheter and surrounding skin were then prepped and draped in usual sterile fashion. Maximal barrier sterile technique was utilized including caps, mask, sterile gowns, sterile gloves, sterile drape, hand hygiene and skin antiseptic.  As antibiotic prophylaxis, cefazolin 1 gram was ordered pre- procedure and administered intravenously within one hour of incision.  Intravenous Fentanyl and Versed were administered as conscious sedation during continuous cardiorespiratory monitoring by the radiology RN, with a total moderate sedation time of 12 minutes.  The previously placed catheter was dissected free of the underlying subcutaneous tissues and removed partially over an angled stiff glide wire.  Contrast injection demonstrated no significant fibrin sheath or SVC thrombus/stenosis.  The catheter was then exchanged over two parallel stiff glide wires for a new 19 cm Equistream hemodialysis catheter, placed with the tips in the mid right atrium.  Spot chest radiograph confirms appropriate catheter position.  Both lumens aspirated and flushed easily.  Both lumens were flushed per protocol.  The catheter was secured externally with O-Prolene sutures and a sterile dressing applied. The patient tolerated the procedure well.  No immediate complication.  IMPRESSION: 1.  Low position of the tips of the  previously placed hemodialysis catheter, in the intrahepatic segment of the IVC. 2.  Technically successful exchange and repositioning of tunneled left IJ hemodialysis catheter, okay for routine  use.   Original Report Authenticated By: D. Andria Rhein, MD    Ir Cv Line Injection  05/17/2012  *RADIOLOGY REPORT*  Clinical data:  End-stage renal disease.  Poor function of tunneled left IJ hemodialysis catheter.  CATHETER INJECTION UNDER FLUOROSCOPY EXCHANGE OF TUNNELED HEMODIALYSIS CENTRAL VENOUS CATHETER  Technique and findings:  Initially, water-soluble contrast was injected through both lumens of the previously placed tunneled left IJ hemodialysis catheter sequentially.  This demonstrated position of the tip of the catheter in the IVC near hepatic vein inflows.  There is occlusion of the distal aspect of the blue venous return lumen.  The catheter and surrounding skin were then prepped and draped in usual sterile fashion. Maximal barrier sterile technique was utilized including caps, mask, sterile gowns, sterile gloves, sterile drape, hand hygiene and skin antiseptic.  As antibiotic prophylaxis, cefazolin 1 gram was ordered pre- procedure and administered intravenously within one hour of incision.  Intravenous Fentanyl and Versed were administered as conscious sedation during continuous cardiorespiratory monitoring by the radiology RN, with a total moderate sedation time of 12 minutes.  The previously placed catheter was dissected free of the underlying subcutaneous tissues and removed partially over an angled stiff glide wire.  Contrast injection demonstrated no significant fibrin sheath or SVC thrombus/stenosis.  The catheter was then exchanged over two parallel stiff glide wires for a new 19 cm Equistream hemodialysis catheter, placed with the tips in the mid right atrium.  Spot chest radiograph confirms appropriate catheter position.  Both lumens aspirated and flushed easily.  Both lumens were flushed per protocol.  The catheter was secured externally with O-Prolene sutures and a sterile dressing applied. The patient tolerated the procedure well.  No immediate complication.  IMPRESSION: 1.  Low  position of the tips of the previously placed hemodialysis catheter, in the intrahepatic segment of the IVC. 2.  Technically successful exchange and repositioning of tunneled left IJ hemodialysis catheter, okay for routine use.   Original Report Authenticated By: D. Andria Rhein, MD    Dg Chest Port 1 View  05/24/2012  *RADIOLOGY REPORT*  Clinical Data: Evaluate nasogastric tube and endotracheal tube placements  PORTABLE CHEST - 1 VIEW  Comparison: Portable exam 1303 hours compared to 05/24/2012  Findings: Tip of endotracheal tube 1.5 cm above carina. Left jugular dual-lumen large-bore central venous catheter with tip projecting over the cavoatrial junction. Nasogastric tube extends into stomach. Upper normal-size of cardiac silhouette. Atherosclerotic calcification aorta. Mediastinal contours and pulmonary vascularity normal. No definite infiltrate, pleural effusion or pneumothorax. No acute osseous findings.  IMPRESSION: No acute abnormalities. Line and tube positions as above.   Original Report Authenticated By: Ulyses Southward, M.D.    Dg Chest Port 1 View  05/24/2012  **ADDENDUM** CREATED: 05/24/2012 10:14:16  Comparison exams from 05/19/2012 and 02/10/2011 are available.  Hilar prominence unchanged.  Pulmonary vascular congestion/mild pulmonary edema slightly more notable than on the prior exams.  **END ADDENDUM** SIGNED BY: Almedia Balls. Constance Goltz, M.D.   05/24/2012  *RADIOLOGY REPORT*  Clinical Data: Increase shortness of breath.  PORTABLE CHEST - 1 VIEW  Comparison: Currently, there are PACS problems and no comparison exams are available.  Findings: Left central line tip in the region of the distal superior vena cava/cavoatrial junction.  No gross pneumothorax.  Mild pulmonary edema.  Hilar prominence may be vascular in origin.  Stability can be confirmed on follow-up.  Heart size top normal.  Calcified slightly tortuous aorta.  No segmental consolidation.  IMPRESSION: No comparisons presently available.  Mild  pulmonary edema.  Please see above.   Original Report Authenticated By: Lacy Duverney, M.D.    Dg Chest [facility]  05/24/2012  Garnetta Buddy, MD     05/24/2012  9:17 AM I have seen and examined this patient and agree with the plan of  care. Low blood pressure and short of breath. patient now being  ultrafiltered. Check CXR doubt that more fluid can be removed.  WEBB,MARTIN W 05/24/2012, 9:16 AM     Dg Femur Left Port  05/20/2012  *RADIOLOGY REPORT*  Clinical Data: Status post ORIF.  PORTABLE LEFT FEMUR - 2 VIEW  Comparison: Intraoperative fluoro spot images 05/20/2012.  Findings: The patient is status post placement of an intramedullary rod in the left femur.  Three distal and two proximal interlocking screws are in place.  There is slight anterior displacement of approximately one cortex width of the distal comminuted fracture. A moderate sized joint effusion is present.  Atherosclerotic calcifications are present within the femoral artery.  IMPRESSION:  1.  Near anatomic reduction of the comminuted distal femur fracture status post ORIF. 2.  No radiographic evidence for complication. 3.  Atherosclerosis.   Original Report Authenticated By: Marin Roberts, M.D.    Dg Knee Complete 4 Views Left  05/19/2012  *RADIOLOGY REPORT*  Clinical Data: Fall  LEFT KNEE - COMPLETE 4+ VIEW  Comparison: Left femur same day  Findings: Two views of the left knee submitted.  No knee fracture or subluxation.  Again noted displaced comminuted fracture distal left femoral shaft.  IMPRESSION: No knee fracture or subluxation.  Again noted displaced comminuted fracture distal left femoral shaft.   Original Report Authenticated By: Natasha Mead, M.D.    Dg C-arm 61-120 Min  05/20/2012  *RADIOLOGY REPORT*  Clinical Data: Post retrograde line and nail of the left femur  DG C-ARM 61-120 MIN, LEFT FEMUR - 2 VIEW  Comparison:  Left femur radiographs - 05/19/2012  Findings:  Six spot intraoperative fluoroscopic images of the  left femur are provided for review.  Images demonstrate retrograde intramedullary nail fixation of previously noted distal diaphyseal/metaphyseal comminuted femur fracture. Alignment is much improved.  The distal end of the intramedullary rod is transfixed with three cancellous screws.  The proximal end of the femoral rods transfixed with two cancellous screws.  Skin staples overlying the proximal thigh. No radiopaque foreign body.  IMPRESSION: Post intramedullary rod fixation of comminuted distal diaphyseal/metaphyseal femoral fracture.   Original Report Authenticated By: Tacey Ruiz, MD          Subjective: Patient is feeling better. [name] tolerating her diet. Denies any fevers, chills, chest pain, shortness of breath, nausea, vomiting, diarrhea, abdominal pain, hematochezia, melena.   Objective: Filed Vitals:   05/29/12 1545 05/29/12 1628 05/29/12 2140 05/30/12 0401  BP: 104/58 108/63 106/55 102/63  Pulse: 76 84 93 94  Temp: 98.6 F (37 C) 98.7 F (37.1 C) 98.6 F (37 C) 98.2 F (36.8 C)  TempSrc: Oral  Oral Oral  Resp: 13 16 16 16   Height:      Weight:   81.285 kg (179 lb 3.2 oz)   SpO2: 100% 100% 100% 100%    Intake/Output Summary (Last 24 hours) at 05/30/12 0916 Last data filed at 05/29/12 2000  Gross per 24 hour  Intake  60 ml  Output   3005 ml  Net  -2945 ml   Weight change: 0.2 kg (7.1 oz) Exam:   General:  Pt is alert, follows commands appropriately, not in acute distress  HEENT: No icterus, No thrush,[place]/AT  Cardiovascular: RRR, S1/S2, no rubs, no gallops  Respiratory: CTA bilaterally, no wheezing, no crackles, no rhonchi  Abdomen: Soft/+BS, non tender, non distended, no guarding  Extremities: 1+ edema, No lymphangitis, No petechiae, No rashes, no synovitis  Data Reviewed: Basic Metabolic Panel:  Lab 05/30/12 1610 05/29/12 0535 05/28/12 1654 05/28/12 0500 05/27/12 0430 05/24/12 0729  NA 138 135 140 135 133* --  K 3.7 3.4* 3.6 2.9* 3.7 --  CL 103  99 100 96 92* --  CO2 22 26 24 23 19  --  GLUCOSE 142* 239* 203* 155* 227* --  BUN 13 20 13  38* 27* --  CREATININE 2.95* 3.64* 2.58* 5.88* 3.92* --  CALCIUM 7.6* 8.1* 8.1* 8.1* 8.0* --  MG -- -- 1.9 -- -- --  PHOS -- 2.6 -- 3.5 3.3 6.6*   Liver Function Tests:  Lab 05/29/12 0535 05/28/12 0500 05/27/12 0430 05/24/12 0729  AST -- -- -- --  ALT -- -- -- --  ALKPHOS -- -- -- --  BILITOT -- -- -- --  PROT -- -- -- --  ALBUMIN 2.1* 2.1* 1.9* 2.1*   No results found for this basename: LIPASE:5,AMYLASE:5 in the last 168 hours No results found for this basename: AMMONIA:5 in the last 168 hours CBC:  Lab 05/30/12 0615 05/29/12 0535 05/28/12 1654 05/28/12 0500 05/27/12 0430 05/26/12 0400 05/24/12 0729  WBC 13.0* 14.0* 13.5* 16.3* 22.6* -- --  NEUTROABS -- -- -- -- -- 23.6* 30.4*  HGB 7.2* 7.3* 7.8* 7.2* 7.0* -- --  HCT 23.4* 23.3* 25.0* 22.6* 22.7* -- --  MCV 83.0 82.3 81.2 80.7 82.5 -- --  PLT 152 135* 128* 128* 161 -- --   Cardiac Enzymes: No results found for this basename: CKTOTAL:5,CKMB:5,CKMBINDEX:5,TROPONINI:5 in the last 168 hours BNP: No components found with this basename: POCBNP:5 CBG:  Lab 05/30/12 0813 05/30/12 0400 05/30/12 0100 05/29/12 2202 05/29/12 1630  GLUCAP 151* 143* 201* 201* 148*    Recent Results (from the past 240 hour(s))  CULTURE, BLOOD (ROUTINE X 2)     Status: Normal   Collection Time   05/22/12  9:34 AM      Component Value Range Status Comment   Specimen Description BLOOD HAND RIGHT   Final    Special Requests BOTTLES DRAWN AEROBIC AND ANAEROBIC 10CC EACH   Final    Culture  Setup Time 05/22/2012 18:15   Final    Culture NO GROWTH 5 DAYS   Final    Report Status 05/28/2012 FINAL   Final   CULTURE, BLOOD (ROUTINE X 2)     Status: Normal   Collection Time   05/22/12  9:42 AM      Component Value Range Status Comment   Specimen Description BLOOD HAND RIGHT   Final    Special Requests BOTTLES DRAWN AEROBIC AND ANAEROBIC 10CC EACH   Final     Culture  Setup Time 05/22/2012 18:15   Final    Culture NO GROWTH 5 DAYS   Final    Report Status 05/28/2012 FINAL   Final   MRSA PCR SCREENING     Status: Normal   Collection Time   05/24/12  2:50 PM      Component Value Range Status Comment   MRSA  by PCR NEGATIVE  NEGATIVE Final   CLOSTRIDIUM DIFFICILE BY PCR     Status: Abnormal   Collection Time   05/24/12  2:53 PM      Component Value Range Status Comment   C difficile by pcr POSITIVE (*) NEGATIVE Final      Scheduled Meds:   . darbepoetin (ARANESP) injection - DIALYSIS  200 mcg Intravenous Q Mon-HD  . ferric gluconate (FERRLECIT/NULECIT) IV  125 mg Intravenous Q M,W,F-HD  . insulin aspart  0-15 Units Subcutaneous Q4H  . levothyroxine  75 mcg Oral QAC breakfast  . metroNIDAZOLE  500 mg Oral Q8H  . multivitamin  1 tablet Oral Daily  . niacin  500 mg Oral Daily  . paricalcitol  1 mcg Intravenous 3 times weekly  . sodium chloride  250 mL Intravenous Once  . vancomycin  500 mg Oral Q6H   Continuous Infusions:   . sodium chloride 20 mL/hr at 05/24/12 1731     Huntleigh Doolen, DO  [facility] Pager [phone]  If 7PM-7AM, please contact night-coverage [email or web address] Password [email or web address] 05/30/2012, 9:16 AM   LOS: 11 days

## 2012-05-31 ENCOUNTER — Inpatient Hospital Stay (HOSPITAL_COMMUNITY): Payer: Medicare Other

## 2012-05-31 DIAGNOSIS — J690 Pneumonitis due to inhalation of food and vomit: Secondary | ICD-10-CM | POA: Diagnosis not present

## 2012-05-31 LAB — BASIC METABOLIC PANEL
BUN: 28 mg/dL — ABNORMAL HIGH (ref 6–23)
CO2: 27 mEq/L (ref 19–32)
Calcium: 8.1 mg/dL — ABNORMAL LOW (ref 8.4–10.5)
Chloride: 98 mEq/L (ref 96–112)
Creatinine, Ser: 5.32 mg/dL — ABNORMAL HIGH (ref 0.50–1.10)
Glucose, Bld: 208 mg/dL — ABNORMAL HIGH (ref 70–99)

## 2012-05-31 LAB — GLUCOSE, CAPILLARY
Glucose-Capillary: 163 mg/dL — ABNORMAL HIGH (ref 70–99)
Glucose-Capillary: 269 mg/dL — ABNORMAL HIGH (ref 70–99)
Glucose-Capillary: 350 mg/dL — ABNORMAL HIGH (ref 70–99)

## 2012-05-31 LAB — BLOOD GAS, ARTERIAL
Acid-Base Excess: 3.8 mmol/L — ABNORMAL HIGH (ref 0.0–2.0)
Bicarbonate: 27.9 mEq/L — ABNORMAL HIGH (ref 20.0–24.0)
Drawn by: 362741
O2 Content: 4 L/min
O2 Saturation: 98.4 %
Patient temperature: 98.6
TCO2: 29.2 mmol/L (ref 0–100)
pCO2 arterial: 43.1 mmHg (ref 35.0–45.0)
pH, Arterial: 7.427 (ref 7.350–7.450)
pO2, Arterial: 82.8 mmHg (ref 80.0–100.0)

## 2012-05-31 LAB — CBC
HCT: 26.3 % — ABNORMAL LOW (ref 36.0–46.0)
Hemoglobin: 8 g/dL — ABNORMAL LOW (ref 12.0–15.0)
MCH: 25.5 pg — ABNORMAL LOW (ref 26.0–34.0)
MCV: 83.8 fL (ref 78.0–100.0)
Platelets: 196 10*3/uL (ref 150–400)
RBC: 3.14 MIL/uL — ABNORMAL LOW (ref 3.87–5.11)
WBC: 18.9 10*3/uL — ABNORMAL HIGH (ref 4.0–10.5)

## 2012-05-31 MED ORDER — HYDROCOD POLST-CHLORPHEN POLST 10-8 MG/5ML PO LQCR
5.0000 mL | Freq: Once | ORAL | Status: AC
  Administered 2012-05-31: 5 mL via ORAL
  Filled 2012-05-31: qty 5

## 2012-05-31 MED ORDER — LEVOFLOXACIN IN D5W 500 MG/100ML IV SOLN: 500.0000 mg | INTRAVENOUS | Status: DC

## 2012-05-31 MED ORDER — HYDROCOD POLST-CHLORPHEN POLST 10-8 MG/5ML PO LQCR
5.0000 mL | Freq: Two times a day (BID) | ORAL | Status: DC | PRN
  Filled 2012-05-31: qty 5

## 2012-05-31 MED ORDER — DOCUSATE SODIUM 283 MG RE ENEM: 1.0000 | ENEMA | RECTAL | Status: DC | PRN

## 2012-05-31 MED ORDER — PROMETHAZINE HCL 25 MG/ML IJ SOLN: 12.5000 mg | Freq: Four times a day (QID) | INTRAMUSCULAR | Status: DC | PRN

## 2012-05-31 MED ORDER — PROMETHAZINE HCL 25 MG/ML IJ SOLN
12.5000 mg | Freq: Four times a day (QID) | INTRAMUSCULAR | Status: DC | PRN
  Administered 2012-05-31: 12.5 mg via INTRAVENOUS
  Filled 2012-05-31: qty 1

## 2012-05-31 MED ORDER — PIPERACILLIN-TAZOBACTAM IN DEX 2-0.25 GM/50ML IV SOLN
2.2500 g | Freq: Three times a day (TID) | INTRAVENOUS | Status: DC
  Administered 2012-05-31 – 2012-06-01 (×4): 2.25 g via INTRAVENOUS
  Filled 2012-05-31 (×6): qty 50

## 2012-05-31 MED ORDER — SODIUM CHLORIDE 0.9 % IV BOLUS (SEPSIS)
250.0000 mL | Freq: Once | INTRAVENOUS | Status: AC
  Administered 2012-05-31: 250 mL via INTRAVENOUS

## 2012-05-31 MED ORDER — LEVOFLOXACIN IN D5W 750 MG/150ML IV SOLN
750.0000 mg | Freq: Once | INTRAVENOUS | Status: AC
  Administered 2012-05-31: 750 mg via INTRAVENOUS
  Filled 2012-05-31: qty 150

## 2012-05-31 NOTE — Progress Notes (Signed)
ANTIBIOTIC CONSULT NOTE - FOLLOW UP  Pharmacy Consult for Levaquin Indication: PNA vs Aspiration PNA  Allergies  Allergen Reactions  . Contrast Media (Iodinated Diagnostic Agents) Itching    Patient Measurements: Height: 5\' 3"  (160 cm) Weight: 169 lb 8.5 oz (76.9 kg) IBW/kg (Calculated) : 52.4  Adjusted Body Weight:   Vital Signs: Temp: 98.1 F (36.7 C) (01/02 0829) Temp src: Oral (01/02 0829) BP: 114/68 mmHg (01/02 0829) Pulse Rate: 87  (01/02 0829) Intake/Output from previous day: 01/01 0701 - 01/02 0700 In: 250 [I.V.:200; IV Piggyback:50] Out: -  Intake/Output from this shift:    Labs:  Basename 05/31/12 0630 05/30/12 0615 05/29/12 0535  WBC 18.9* 13.0* 14.0*  HGB 8.0* 7.2* 7.3*  PLT 196 152 135*  LABCREA -- -- --  CREATININE 5.32* 2.95* 3.64*   Estimated Creatinine Clearance: 10.2 ml/min (by C-G formula based on Cr of 5.32). No results found for this basename: VANCOTROUGH:2,VANCOPEAK:2,VANCORANDOM:2,GENTTROUGH:2,GENTPEAK:2,GENTRANDOM:2,TOBRATROUGH:2,TOBRAPEAK:2,TOBRARND:2,AMIKACINPEAK:2,AMIKACINTROU:2,AMIKACIN:2, in the last 72 hours   Microbiology: Recent Results (from the past 720 hour(s))  SURGICAL PCR SCREEN     Status: Normal   Collection Time   05/20/12  1:45 AM      Component Value Range Status Comment   MRSA, PCR NEGATIVE  NEGATIVE Final    Staphylococcus aureus NEGATIVE  NEGATIVE Final   CULTURE, BLOOD (ROUTINE X 2)     Status: Normal   Collection Time   05/22/12  9:34 AM      Component Value Range Status Comment   Specimen Description BLOOD HAND RIGHT   Final    Special Requests BOTTLES DRAWN AEROBIC AND ANAEROBIC 10CC EACH   Final    Culture  Setup Time 05/22/2012 18:15   Final    Culture NO GROWTH 5 DAYS   Final    Report Status 05/28/2012 FINAL   Final   CULTURE, BLOOD (ROUTINE X 2)     Status: Normal   Collection Time   05/22/12  9:42 AM      Component Value Range Status Comment   Specimen Description BLOOD HAND RIGHT   Final    Special  Requests BOTTLES DRAWN AEROBIC AND ANAEROBIC 10CC EACH   Final    Culture  Setup Time 05/22/2012 18:15   Final    Culture NO GROWTH 5 DAYS   Final    Report Status 05/28/2012 FINAL   Final   MRSA PCR SCREENING     Status: Normal   Collection Time   05/24/12  2:50 PM      Component Value Range Status Comment   MRSA by PCR NEGATIVE  NEGATIVE Final   CLOSTRIDIUM DIFFICILE BY PCR     Status: Abnormal   Collection Time   05/24/12  2:53 PM      Component Value Range Status Comment   C difficile by pcr POSITIVE (*) NEGATIVE Final     Anti-infectives     Start     Dose/Rate Route Frequency Ordered Stop   05/31/12 0430  piperacillin-tazobactam (ZOSYN) IVPB 2.25 g       2.25 g 100 mL/hr over 30 Minutes Intravenous 3 times per day 05/31/12 0338     05/28/12 1400   vancomycin (VANCOCIN) 750 mg in sodium chloride 0.9 % 150 mL IVPB  Status:  Discontinued        750 mg 150 mL/hr over 60 Minutes Intravenous  Once 05/27/12 1146 05/28/12 1122   05/28/12 1130   vancomycin (VANCOCIN) 750 mg in sodium chloride 0.9 % 150 mL IVPB  Status:  Discontinued        750 mg 150 mL/hr over 60 Minutes Intravenous  Once 05/28/12 1121 05/28/12 1244   05/27/12 1000   metroNIDAZOLE (FLAGYL) tablet 500 mg  Status:  Discontinued        500 mg Oral 3 times per day 05/27/12 0908 05/30/12 0916   05/26/12 2200   metroNIDAZOLE (FLAGYL) IVPB 500 mg  Status:  Discontinued        500 mg 100 mL/hr over 60 Minutes Intravenous Every 8 hours 05/26/12 1654 05/27/12 0908   05/26/12 1800   vancomycin (VANCOCIN) 50 mg/mL oral solution 500 mg        500 mg Oral 4 times per day 05/26/12 1654 06/07/12 1759   05/26/12 1330   vancomycin (VANCOCIN) 750 mg in sodium chloride 0.9 % 150 mL IVPB        750 mg 150 mL/hr over 60 Minutes Intravenous To Hemodialysis 05/26/12 1325 05/26/12 1621   05/24/12 1800   vancomycin (VANCOCIN) 50 mg/mL oral solution 500 mg  Status:  Discontinued        500 mg Per Tube 4 times per day 05/24/12 1627  05/26/12 1654   05/24/12 1730   metroNIDAZOLE (FLAGYL) IVPB 500 mg  Status:  Discontinued        500 mg 100 mL/hr over 60 Minutes Intravenous Every 8 hours 05/24/12 1627 05/26/12 1654   05/24/12 1200   piperacillin-tazobactam (ZOSYN) IVPB 2.25 g  Status:  Discontinued        2.25 g 100 mL/hr over 30 Minutes Intravenous Every 8 hours 05/24/12 1046 05/25/12 1049   05/23/12 1200   vancomycin (VANCOCIN) 750 mg in sodium chloride 0.9 % 150 mL IVPB  Status:  Discontinued        750 mg 150 mL/hr over 60 Minutes Intravenous Every M-W-F (Hemodialysis) 05/22/12 0924 05/25/12 1002   05/22/12 0930   vancomycin (VANCOCIN) 1,500 mg in sodium chloride 0.9 % 500 mL IVPB        1,500 mg 250 mL/hr over 120 Minutes Intravenous  Once 05/22/12 0924 05/22/12 1750   05/20/12 2100   ceFAZolin (ANCEF) IVPB 1 g/50 mL premix        1 g 100 mL/hr over 30 Minutes Intravenous Every 12 hours 05/20/12 1349 05/20/12 2307   05/19/12 2345   ceFAZolin (ANCEF) IVPB 1 g/50 mL premix        1 g 100 mL/hr over 30 Minutes Intravenous  Once 05/20/12 0008 05/20/12 0234          Assessment: 66yof to start Levaquin for suspected PNA (possible aspiration). Patient is also receiving Zosyn for PNA coverage. Patient has ESRD and receives HD MWF - will adjust Levaquin regimen.  - Tmax 100.4 - WBC 18.9  Plan:  1. Levaquin 750mg  IV x 1, then 500mg  IV q48h 2. Follow-up clinical status, HD plans and antibiotic management  Cleon Dew [phone] 05/31/2012,10:50 AM

## 2012-05-31 NOTE — Progress Notes (Signed)
ANTIBIOTIC CONSULT NOTE - INITIAL  Pharmacy Consult for Zosyn Indication: rule out pneumonia  Allergies  Allergen Reactions  . Contrast Media (Iodinated Diagnostic Agents) Itching    Patient Measurements: Height: 5\' 3"  (160 cm) Weight: 169 lb 8.5 oz (76.9 kg) IBW/kg (Calculated) : 52.4   Vital Signs: Temp: 98.7 F (37.1 C) (01/02 0145) Temp src: Oral (01/02 0145) BP: 115/58 mmHg (01/02 0145) Pulse Rate: 93  (01/02 0145) Intake/Output from previous day: 01/01 0701 - 01/02 0700 In: 120 [I.V.:120] Out: -  Intake/Output from this shift: Total I/O In: 40 [I.V.:40] Out: -   Labs:  Basename 05/30/12 0615 05/29/12 0535 05/28/12 1654  WBC 13.0* 14.0* 13.5*  HGB 7.2* 7.3* 7.8*  PLT 152 135* 128*  LABCREA -- -- --  CREATININE 2.95* 3.64* 2.58*   Estimated Creatinine Clearance: 18.4 ml/min (by C-G formula based on Cr of 2.95). No results found for this basename: VANCOTROUGH:2,VANCOPEAK:2,VANCORANDOM:2,GENTTROUGH:2,GENTPEAK:2,GENTRANDOM:2,TOBRATROUGH:2,TOBRAPEAK:2,TOBRARND:2,AMIKACINPEAK:2,AMIKACINTROU:2,AMIKACIN:2, in the last 72 hours   Microbiology: Recent Results (from the past 720 hour(s))  SURGICAL PCR SCREEN     Status: Normal   Collection Time   05/20/12  1:45 AM      Component Value Range Status Comment   MRSA, PCR NEGATIVE  NEGATIVE Final    Staphylococcus aureus NEGATIVE  NEGATIVE Final   CULTURE, BLOOD (ROUTINE X 2)     Status: Normal   Collection Time   05/22/12  9:34 AM      Component Value Range Status Comment   Specimen Description BLOOD HAND RIGHT   Final    Special Requests BOTTLES DRAWN AEROBIC AND ANAEROBIC 10CC EACH   Final    Culture  Setup Time 05/22/2012 18:15   Final    Culture NO GROWTH 5 DAYS   Final    Report Status 05/28/2012 FINAL   Final   CULTURE, BLOOD (ROUTINE X 2)     Status: Normal   Collection Time   05/22/12  9:42 AM      Component Value Range Status Comment   Specimen Description BLOOD HAND RIGHT   Final    Special Requests  BOTTLES DRAWN AEROBIC AND ANAEROBIC 10CC EACH   Final    Culture  Setup Time 05/22/2012 18:15   Final    Culture NO GROWTH 5 DAYS   Final    Report Status 05/28/2012 FINAL   Final   MRSA PCR SCREENING     Status: Normal   Collection Time   05/24/12  2:50 PM      Component Value Range Status Comment   MRSA by PCR NEGATIVE  NEGATIVE Final   CLOSTRIDIUM DIFFICILE BY PCR     Status: Abnormal   Collection Time   05/24/12  2:53 PM      Component Value Range Status Comment   C difficile by pcr POSITIVE (*) NEGATIVE Final     Medical History: Past Medical History  Diagnosis Date  . ESRD on hemodialysis     MWF at [facility] HD. Started HD November 26, 2009. 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  Marland Kitchen COPD (chronic obstructive pulmonary disease)   . Diabetes mellitus   . Hypertension   . Thyroid disease   . Hyperlipidemia   . Bronchitis   . Leg pain   . 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 Peripheral vascular disease   . Hypothyroidism   . Arthritis     knee    Assessment: 67 y.o. F with  ESRD who started coughing with brown mucous noted early this morning. A CXR revealed a new mild right basilar airspace opacification concerning for PNA vs. aspiration. Pharmacy was consulted to start Zosyn for empiric PNA coverage.   The patient is also noted to be positive for CDiff and is currently being treated with oral Vancomycin. Will need to try to limit exposure to empiric antibiotics to a minimum needed to treat the patient's PNA. Consider adding a stop date after ~7 days.   Goal of Therapy:  Proper antibiotics for infection/cultures adjusted for renal/hepatic function   Plan:  1. Zosyn 2.25g IV every 8 hours 2. Will continue to follow renal function, culture results, LOT, and antibiotic de-escalation plans   Georgina Pillion, PharmD, BCPS Clinical Pharmacist Pager: [phone] 05/31/2012 3:37 AM

## 2012-05-31 NOTE — Progress Notes (Signed)
Called to patient's room at approximately 0130.  [name] coughing non-stop and vomiting thin brown mucous streaked vomit.  This is the 3rd time she has vomited tonight.  Zofran PO given earlier in the night with out relief.  [name] not complaining of shortness of breath.  No complaints of pain.  VS:  98.7, 115/58, 93 HR, 22 Resp, and 100% on 2 LPM [facility].  Breath sounds are diminished.  She cannot tell me if she is coughing anything up.  She states her vomit smells "sour".  Called [facility] and received order for IV Phen and Stat Chest Xray.  Will continue to monitor patient.   Mauri Temkin RN, Justine Null

## 2012-05-31 NOTE — Progress Notes (Signed)
Pt had bp of 82/46,asymptomatic, at 2100. Per MD order, 250 cc NS bolus given. Pt's bp is 96/49, asymptomatic. MD has been paged twice. Will continue to monitor

## 2012-05-31 NOTE — Progress Notes (Signed)
[facility] PROGRESS NOTE  [name] ZOX:[id number] DOB: 07/29/1945 DOA: 05/19/2012 PCP: Avon Gully, MD  Brief Narrative  Pt is a 67 y/o female with ERSD admitted from [facility] s/p mechanical fall with L hip fracture and was transferred to [facility] and ortho consulted for IM nailing 12/22. On 12/26 she was noted to have fever of 103, tachycardia, dyspnea and change in mental status concerning for sepsis. PCCM consulted for sepsis. Found to have c diff colitis & septic shock requiring mechanical ventialtion 12/26>>1227., was transferred back to [facility] today 12/31   Assessment/Plan: L. Distal Femur Fracture, supracondylar  -s/p IM nailing on 12/22 per Dr Carola Frost  -PT OT recommending SNF   Aspiration pneumonia Will request speech therapy to assess patient's swallowing. Started on Zosyn on 06/01/2011. CXR on 06/01/2011 showed new mild right basilar airspace opacification concerning for pneumonia. Add Levofloxacin to her regimen. Depending on improvement DC zosyn. Given C. Diff colitis minimize antibiotics as much as possible.  Acute Resp failure/sepsis with metabolic encephalopathy  Intubated 12/26>12/27 per CCM, now resolved. Resovled. Was found to be C.DIFF +>>source of sepsis>>improved on abx. Continue oral vanc. Initial procalcitonin 17.02, lactic acid 1.1. Blood cultures negative.  C.diff colitis  CT abd and pelvis with diffuse colitis--no perf or abscess. Clinically improving. Continue oral vancomycin every 6 hours, plan 14 days total, may need a longer course given patient has been started on antibiotics for Aspiration pneumonia.  -PO vanc 12/26>>> -IV Flagyl 12/26>>>12/29 -PO Flagyl 12/29>>>05/30/12  ESRD  Dialysis per renal   Anemia  Secondary to #4  On aranesp, she is Jehovah's witness>> no blood transfusion   HTN  Outpt meds on hold for now as BPs improved but still soft, monitor resume when appropriate. BP normotensive.   Decondtioning  PT recommending SNF as above, SW  consulted   Hypokalemia  Replaced.  Hypothyroidism Continue Synthroid  Diabetes mellitus type 2 Continue NovoLog sliding scale. Hemoglobin A1c 7.4. Patient takes glipizide at home.  Code Status: Full  Family Communication: Directly with pt at bedside  Disposition Plan:  D/C when bed available at SNF, pending antibiotics started for aspiration pneumonia.  Procedures/Studies:  IMPRESSION: DG CHEST PORT 1 VIEW 05/31/2012 New mild right basilar airspace opacification, concerning for pneumonia; aspiration could have such an appearance.  Ct Abdomen Pelvis Wo Contrast  05/24/2012  *RADIOLOGY REPORT*  Clinical Data: Rectal bleeding, recent abnormal CT with sigmoid wall thickening, new sepsis  CT ABDOMEN AND PELVIS WITHOUT CONTRAST  Technique:  Multidetector CT imaging of the abdomen and pelvis was performed following the standard protocol without intravenous contrast. Small amount of GI contrast is present.  Sagittal and coronal MPR images reconstructed from axial data set.  Comparison: 05/17/2012  Findings: Mild atelectasis left lung base. Right femoral line, tip IVC. Nasogastric tube in stomach. Minimal pericardial effusion Slightly increased. Streak artifacts from the patient's arms traverse the upper abdomen. Low to intermediate attenuation lesion within the anterior 5.1 x 4.0 cm image 30.  Within limits of a nonenhanced exam, no focal abnormalities of the liver, spleen, pancreas, kidneys or adrenal glands otherwise identified. Atherosclerotic calcifications without aneurysm. Large and small bowel loops are underdistended making assessment of wall thickness suboptimal. However there appears to be diffuse wall thickening of the colon with adjacent pericolic infiltration and multiple sites question colitis. Minimal sigmoid diverticulosis incidentally noted.  Bladder partially distended, cystocele noted. Probable 10 mm uterine leiomyoma at fundus. Scattered normal-sized mesenteric nodes medial to  right colon. No additional mass, adenopathy, free fluid,  or free air. Bones appear diffusely demineralized.  IMPRESSION: Probable diffuse colitis; differential diagnosis includes infection, inflammatory bowel disease, and ischemia; when I called results to the patient's nurse, he indicated a new positive laboratory test for Clostridium difficile, which would account for this finding. Minimal pericardial effusion, increased. Remainder of exam unchanged from prior recent CT.  Findings called to [facility] on 2100 on 05/24/2012 at 1653 hours.   Original Report Authenticated By: Ulyses Southward, M.D.    Ct Abdomen Pelvis Wo Contrast  05/17/2012  *RADIOLOGY REPORT*  Clinical Data: Rectal bleeding and lower abdominal pain.  CT ABDOMEN AND PELVIS WITHOUT CONTRAST  Technique:  Multidetector CT imaging of the abdomen and pelvis was performed following the standard protocol without intravenous contrast.  Comparison: CT abdomen pelvis 08/19/2009  Findings: At the lung base, heavy coronary artery atherosclerotic calcification is noted.  The heart is not completely imaged.  The visualized lungs are clear.  The noncontrast appearance of the liver, spleen, adrenal glands, and pancreas is within normal limits.  A 4.5 x 3.7 centimeter water density lesion in the mid pole of the left kidney statistically most likely a renal cyst, but not completely characterized on noncontrast CT.  There are multiple hyperdense rounded lesions associated with the left renal cortex, measuring in the range of 7- 8 mm, which may be hyperdense cysts.  A few calcifications associated with both kidneys are favored to be vascular calcifications.  There is no hydronephrosis.  A hyperdense lesion associated the lower pole of the right kidney measures 10 mm.  There is extensive atherosclerotic calcification of the aortoiliac vasculature.  There is infrarenal abdominal aortic ectasia measuring up to 2.2 cm AP diameter.  Negative for aneurysm.  The appendix is  identified, and courses posterior to the cecum. Proximally, the appendix measures 8-9 mm in diameter, mildly prominent.  The distal tip of the appendix measures 5 mm and contains a small locule of gas within its lumen.  The proximal aspect of the appendix does not contain gas.  No periappendiceal inflammatory stranding is appreciated.  There is extensive diverticulosis of the sigmoid colon.  There is some sigmoid wall thickening (for example image number 58).  No discrete inflammatory stranding is seen in association with the diverticula.  There is a moderate amount of stool in the colon. There is no evidence of bowel obstruction.  The uterus is retroverted and contains a few coarse calcifications, suggesting the presence of uterine fibroids.  Urinary bladder is unremarkable.  There is degenerative disc disease at L5-S1.  Vertebral bodies are normal in height and alignment.  IMPRESSION:  1.  Early appendicitis cannot be excluded, but is not definite. The proximal appendix measures prominent in caliber, up to 8 to 9 mm, but it is noted that the proximal appendix measured approximately 8 mm on the prior CT of March 2011.  No periappendiceal inflammatory stranding is seen.  Suggest clinical correlation, and follow-up imaging as warranted. 2.  Diverticulosis of the sigmoid colon without definite CT evidence of acute diverticulitis.  In the region of the diverticula, there is suggestion of sigmoid colon wall thickening, which may be secondary to muscular hypertrophy related to diverticulosis.  Acute colitis or underlying neoplasm cannot be excluded. 3.  4.6 cm water density left renal mass.  This is likely renal cyst.  This could be confirmed with renal ultrasound is desired. 4.  Several small bilateral, left greater than right, high density renal lesions likely reflect complex renal cysts.  These cannot  be completely characterized on noncontrast CT. 5.  Aortoiliac atherosclerosis with ectasia of the infrarenal abdominal  aorta.   Original Report Authenticated By: Britta Mccreedy, M.D.    Dg Chest 1 View  05/19/2012  *RADIOLOGY REPORT*  Clinical Data: Larey Seat from bed  CHEST - 1 VIEW  Comparison: 02/27/2012  Findings: Left jugular dual-lumen central venous catheter tip projecting over cavoatrial junction. Enlargement of cardiac silhouette with pulmonary vascular congestion. Mild diffuse increased interstitial markings in both lungs question minimal edema. No segmental consolidation, pleural effusion or pneumothorax. Bones demineralized.  IMPRESSION: Enlargement of cardiac silhouette with pulmonary vascular congestion. Question minimal pulmonary edema.   Original Report Authenticated By: Ulyses Southward, M.D.    Dg Femur Left  05/20/2012  *RADIOLOGY REPORT*  Clinical Data: Post retrograde line and nail of the left femur  DG C-ARM 61-120 MIN, LEFT FEMUR - 2 VIEW  Comparison:  Left femur radiographs - 05/19/2012  Findings:  Six spot intraoperative fluoroscopic images of the left femur are provided for review.  Images demonstrate retrograde intramedullary nail fixation of previously noted distal diaphyseal/metaphyseal comminuted femur fracture. Alignment is much improved.  The distal end of the intramedullary rod is transfixed with three cancellous screws.  The proximal end of the femoral rods transfixed with two cancellous screws.  Skin staples overlying the proximal thigh. No radiopaque foreign body.  IMPRESSION: Post intramedullary rod fixation of comminuted distal diaphyseal/metaphyseal femoral fracture.   Original Report Authenticated By: Tacey Ruiz, MD    Dg Femur Left  05/19/2012  *RADIOLOGY REPORT*  Clinical Data: Fall  LEFT FEMUR - 2 VIEW  Comparison: None.  Findings: Three views of the left femur submitted.  There is displaced comminuted fracture in distal left femoral shaft.  IMPRESSION: Displaced comminuted fracture distal left femoral shaft.   Original Report Authenticated By: Natasha Mead, M.D.    Dg Tibia/fibula  Left  05/19/2012  *RADIOLOGY REPORT*  Clinical Data: Fall  LEFT TIBIA AND FIBULA - 2 VIEW  Comparison: None.  Findings: Two views of the left tibia-fibula submitted.  No acute fracture or subluxation.  Atherosclerotic vascular calcifications are noted.  IMPRESSION: No acute fracture or subluxation.   Original Report Authenticated By: Natasha Mead, M.D.    Ir Fluoro Guide Cv Line Right  05/17/2012  *RADIOLOGY REPORT*  Clinical data:  End-stage renal disease.  Poor function of tunneled left IJ hemodialysis catheter.  CATHETER INJECTION UNDER FLUOROSCOPY EXCHANGE OF TUNNELED HEMODIALYSIS CENTRAL VENOUS CATHETER  Technique and findings:  Initially, water-soluble contrast was injected through both lumens of the previously placed tunneled left IJ hemodialysis catheter sequentially.  This demonstrated position of the tip of the catheter in the IVC near hepatic vein inflows.  There is occlusion of the distal aspect of the blue venous return lumen.  The catheter and surrounding skin were then prepped and draped in usual sterile fashion. Maximal barrier sterile technique was utilized including caps, mask, sterile gowns, sterile gloves, sterile drape, hand hygiene and skin antiseptic.  As antibiotic prophylaxis, cefazolin 1 gram was ordered pre- procedure and administered intravenously within one hour of incision.  Intravenous Fentanyl and Versed were administered as conscious sedation during continuous cardiorespiratory monitoring by the radiology RN, with a total moderate sedation time of 12 minutes.  The previously placed catheter was dissected free of the underlying subcutaneous tissues and removed partially over an angled stiff glide wire.  Contrast injection demonstrated no significant fibrin sheath or SVC thrombus/stenosis.  The catheter was then exchanged over two parallel stiff glide  wires for a new 19 cm Equistream hemodialysis catheter, placed with the tips in the mid right atrium.  Spot chest radiograph confirms  appropriate catheter position.  Both lumens aspirated and flushed easily.  Both lumens were flushed per protocol.  The catheter was secured externally with O-Prolene sutures and a sterile dressing applied. The patient tolerated the procedure well.  No immediate complication.  IMPRESSION: 1.  Low position of the tips of the previously placed hemodialysis catheter, in the intrahepatic segment of the IVC. 2.  Technically successful exchange and repositioning of tunneled left IJ hemodialysis catheter, okay for routine use.   Original Report Authenticated By: D. Andria Rhein, MD    Ir Cv Line Injection  05/17/2012  *RADIOLOGY REPORT*  Clinical data:  End-stage renal disease.  Poor function of tunneled left IJ hemodialysis catheter.  CATHETER INJECTION UNDER FLUOROSCOPY EXCHANGE OF TUNNELED HEMODIALYSIS CENTRAL VENOUS CATHETER  Technique and findings:  Initially, water-soluble contrast was injected through both lumens of the previously placed tunneled left IJ hemodialysis catheter sequentially.  This demonstrated position of the tip of the catheter in the IVC near hepatic vein inflows.  There is occlusion of the distal aspect of the blue venous return lumen.  The catheter and surrounding skin were then prepped and draped in usual sterile fashion. Maximal barrier sterile technique was utilized including caps, mask, sterile gowns, sterile gloves, sterile drape, hand hygiene and skin antiseptic.  As antibiotic prophylaxis, cefazolin 1 gram was ordered pre- procedure and administered intravenously within one hour of incision.  Intravenous Fentanyl and Versed were administered as conscious sedation during continuous cardiorespiratory monitoring by the radiology RN, with a total moderate sedation time of 12 minutes.  The previously placed catheter was dissected free of the underlying subcutaneous tissues and removed partially over an angled stiff glide wire.  Contrast injection demonstrated no significant fibrin sheath or  SVC thrombus/stenosis.  The catheter was then exchanged over two parallel stiff glide wires for a new 19 cm Equistream hemodialysis catheter, placed with the tips in the mid right atrium.  Spot chest radiograph confirms appropriate catheter position.  Both lumens aspirated and flushed easily.  Both lumens were flushed per protocol.  The catheter was secured externally with O-Prolene sutures and a sterile dressing applied. The patient tolerated the procedure well.  No immediate complication.  IMPRESSION: 1.  Low position of the tips of the previously placed hemodialysis catheter, in the intrahepatic segment of the IVC. 2.  Technically successful exchange and repositioning of tunneled left IJ hemodialysis catheter, okay for routine use.   Original Report Authenticated By: D. Andria Rhein, MD    Dg Chest Port 1 View  05/24/2012  *RADIOLOGY REPORT*  Clinical Data: Evaluate nasogastric tube and endotracheal tube placements  PORTABLE CHEST - 1 VIEW  Comparison: Portable exam 1303 hours compared to 05/24/2012  Findings: Tip of endotracheal tube 1.5 cm above carina. Left jugular dual-lumen large-bore central venous catheter with tip projecting over the cavoatrial junction. Nasogastric tube extends into stomach. Upper normal-size of cardiac silhouette. Atherosclerotic calcification aorta. Mediastinal contours and pulmonary vascularity normal. No definite infiltrate, pleural effusion or pneumothorax. No acute osseous findings.  IMPRESSION: No acute abnormalities. Line and tube positions as above.   Original Report Authenticated By: Ulyses Southward, M.D.    Dg Chest Port 1 View  05/24/2012  **ADDENDUM** CREATED: 05/24/2012 10:14:16  Comparison exams from 05/19/2012 and 02/10/2011 are available.  Hilar prominence unchanged.  Pulmonary vascular congestion/mild pulmonary edema slightly more notable than on the prior  exams.  **END ADDENDUM** SIGNED BY: Almedia Balls. Constance Goltz, M.D.   05/24/2012  *RADIOLOGY REPORT*  Clinical Data:  Increase shortness of breath.  PORTABLE CHEST - 1 VIEW  Comparison: Currently, there are PACS problems and no comparison exams are available.  Findings: Left central line tip in the region of the distal superior vena cava/cavoatrial junction.  No gross pneumothorax.  Mild pulmonary edema.  Hilar prominence may be vascular in origin.  Stability can be confirmed on follow-up.  Heart size top normal.  Calcified slightly tortuous aorta.  No segmental consolidation.  IMPRESSION: No comparisons presently available.  Mild pulmonary edema.  Please see above.   Original Report Authenticated By: Lacy Duverney, M.D.    Dg Chest [facility]  05/24/2012  Garnetta Buddy, MD     05/24/2012  9:17 AM I have seen and examined this patient and agree with the plan of  care. Low blood pressure and short of breath. patient now being  ultrafiltered. Check CXR doubt that more fluid can be removed.  WEBB,MARTIN W 05/24/2012, 9:16 AM     Dg Femur Left Port  05/20/2012  *RADIOLOGY REPORT*  Clinical Data: Status post ORIF.  PORTABLE LEFT FEMUR - 2 VIEW  Comparison: Intraoperative fluoro spot images 05/20/2012.  Findings: The patient is status post placement of an intramedullary rod in the left femur.  Three distal and two proximal interlocking screws are in place.  There is slight anterior displacement of approximately one cortex width of the distal comminuted fracture. A moderate sized joint effusion is present.  Atherosclerotic calcifications are present within the femoral artery.  IMPRESSION:  1.  Near anatomic reduction of the comminuted distal femur fracture status post ORIF. 2.  No radiographic evidence for complication. 3.  Atherosclerosis.   Original Report Authenticated By: Marin Roberts, M.D.    Dg Knee Complete 4 Views Left  05/19/2012  *RADIOLOGY REPORT*  Clinical Data: Fall  LEFT KNEE - COMPLETE 4+ VIEW  Comparison: Left femur same day  Findings: Two views of the left knee submitted.  No knee fracture or  subluxation.  Again noted displaced comminuted fracture distal left femoral shaft.  IMPRESSION: No knee fracture or subluxation.  Again noted displaced comminuted fracture distal left femoral shaft.   Original Report Authenticated By: Natasha Mead, M.D.    Dg C-arm 61-120 Min  05/20/2012  *RADIOLOGY REPORT*  Clinical Data: Post retrograde line and nail of the left femur  DG C-ARM 61-120 MIN, LEFT FEMUR - 2 VIEW  Comparison:  Left femur radiographs - 05/19/2012  Findings:  Six spot intraoperative fluoroscopic images of the left femur are provided for review.  Images demonstrate retrograde intramedullary nail fixation of previously noted distal diaphyseal/metaphyseal comminuted femur fracture. Alignment is much improved.  The distal end of the intramedullary rod is transfixed with three cancellous screws.  The proximal end of the femoral rods transfixed with two cancellous screws.  Skin staples overlying the proximal thigh. No radiopaque foreign body.  IMPRESSION: Post intramedullary rod fixation of comminuted distal diaphyseal/metaphyseal femoral fracture.   Original Report Authenticated By: Tacey Ruiz, MD     Antibiotics: Zosyn 05/31/2012 >> PO vancomycin 12/26>>> Levofloxacin 05/31/2012   Subjective: Breathing better. Had nausea and vomiting yesterday. Nausea improved today. Complaining of dry mouth.   Objective: Filed Vitals:   05/31/12 0410 05/31/12 0416 05/31/12 0459 05/31/12 0829  BP:  112/68  114/68  Pulse:  95  87  Temp:  100.4 F (38 C)  98.1 F (36.7  C)  TempSrc:  Oral  Oral  Resp: 24 22 24 22   Height:      Weight:      SpO2: 85% 93% 97% 98%    Intake/Output Summary (Last 24 hours) at 05/31/12 1010 Last data filed at 05/31/12 0700  Gross per 24 hour  Intake    250 ml  Output      0 ml  Net    250 ml   Weight change: -2 kg (-4 lb 6.6 oz) Exam: Physical Exam: General: Awake, Oriented, No acute distress. HEENT: EOMI. Neck: Supple CV: S1 and S2 Lungs: Crackles at bases  bilaterally. Abdomen: Soft, Nontender, Nondistended, +bowel sounds. Ext: Good pulses. Trace edema.  Data Reviewed: Basic Metabolic Panel:  Lab 05/31/12 9604 05/30/12 0615 05/29/12 0535 05/28/12 1654 05/28/12 0500 05/27/12 0430  NA 141 138 135 140 135 --  K 3.7 3.7 3.4* 3.6 2.9* --  CL 98 103 99 100 96 --  CO2 27 22 26 24 23  --  GLUCOSE 208* 142* 239* 203* 155* --  BUN 28* 13 20 13  38* --  CREATININE 5.32* 2.95* 3.64* 2.58* 5.88* --  CALCIUM 8.1* 7.6* 8.1* 8.1* 8.1* --  MG -- -- -- 1.9 -- --  PHOS -- -- 2.6 -- 3.5 3.3   Liver Function Tests:  Lab 05/29/12 0535 05/28/12 0500 05/27/12 0430  AST -- -- --  ALT -- -- --  ALKPHOS -- -- --  BILITOT -- -- --  PROT -- -- --  ALBUMIN 2.1* 2.1* 1.9*   No results found for this basename: LIPASE:5,AMYLASE:5 in the last 168 hours No results found for this basename: AMMONIA:5 in the last 168 hours CBC:  Lab 05/31/12 0630 05/30/12 0615 05/29/12 0535 05/28/12 1654 05/28/12 0500 05/26/12 0400  WBC 18.9* 13.0* 14.0* 13.5* 16.3* --  NEUTROABS -- -- -- -- -- 23.6*  HGB 8.0* 7.2* 7.3* 7.8* 7.2* --  HCT 26.3* 23.4* 23.3* 25.0* 22.6* --  MCV 83.8 83.0 82.3 81.2 80.7 --  PLT 196 152 135* 128* 128* --   Cardiac Enzymes: No results found for this basename: CKTOTAL:5,CKMB:5,CKMBINDEX:5,TROPONINI:5 in the last 168 hours BNP: No components found with this basename: POCBNP:5 CBG:  Lab 05/31/12 0747 05/31/12 0356 05/30/12 2339 05/30/12 2011 05/30/12 1711  GLUCAP 163* 169* 247* 300* 240*    Recent Results (from the past 240 hour(s))  CULTURE, BLOOD (ROUTINE X 2)     Status: Normal   Collection Time   05/22/12  9:34 AM      Component Value Range Status Comment   Specimen Description BLOOD HAND RIGHT   Final    Special Requests BOTTLES DRAWN AEROBIC AND ANAEROBIC 10CC EACH   Final    Culture  Setup Time 05/22/2012 18:15   Final    Culture NO GROWTH 5 DAYS   Final    Report Status 05/28/2012 FINAL   Final   CULTURE, BLOOD (ROUTINE X 2)      Status: Normal   Collection Time   05/22/12  9:42 AM      Component Value Range Status Comment   Specimen Description BLOOD HAND RIGHT   Final    Special Requests BOTTLES DRAWN AEROBIC AND ANAEROBIC 10CC EACH   Final    Culture  Setup Time 05/22/2012 18:15   Final    Culture NO GROWTH 5 DAYS   Final    Report Status 05/28/2012 FINAL   Final   MRSA PCR SCREENING     Status: Normal  Collection Time   05/24/12  2:50 PM      Component Value Range Status Comment   MRSA by PCR NEGATIVE  NEGATIVE Final   CLOSTRIDIUM DIFFICILE BY PCR     Status: Abnormal   Collection Time   05/24/12  2:53 PM      Component Value Range Status Comment   C difficile by pcr POSITIVE (*) NEGATIVE Final      Scheduled Meds:    . darbepoetin (ARANESP) injection - DIALYSIS  200 mcg Intravenous Q Mon-HD  . feeding supplement (NEPRO CARB STEADY)  237 mL Oral BID BM  . feeding supplement  30 mL Oral BID WC  . ferric gluconate (FERRLECIT/NULECIT) IV  125 mg Intravenous Q M,W,F-HD  . insulin aspart  0-15 Units Subcutaneous Q4H  . levothyroxine  75 mcg Oral QAC breakfast  . multivitamin  1 tablet Oral Daily  . niacin  500 mg Oral Daily  . paricalcitol  1 mcg Intravenous 3 times weekly  . piperacillin-tazobactam (ZOSYN)  IV  2.25 g Intravenous Q8H  . sodium chloride  250 mL Intravenous Once  . vancomycin  500 mg Oral Q6H   Continuous Infusions:    . sodium chloride 10 mL/hr at 05/30/12 1132     Ghada Abbett A, MD  [facility] Pager [phone]  If 7PM-7AM, please contact night-coverage [email or web address] Password [email or web address] 05/31/2012, 10:10 AM   LOS: 12 days

## 2012-05-31 NOTE — Progress Notes (Signed)
[name] Progress Note  Subjective:   Feels bad. Mouth is dry.  Said vomiting was due to coughing so much.  No recent diarrhea  Objective Filed Vitals:   05/31/12 0410 05/31/12 0416 05/31/12 0459 05/31/12 0829  BP:  112/68  114/68  Pulse:  95  87  Temp:  100.4 F (38 C)  98.1 F (36.7 C)  TempSrc:  Oral  Oral  Resp: 24 22 24 22   Height:      Weight:      SpO2: 85% 93% 97% 98%   Physical Exam General: Ill apearing Heart: RRR Lungs: decreased BS on right; no appreciable wheezes or rales Abdomen: soft Extremities: 1+ left LE edema; no right LE edema (SCDs on right leg) Dialysis Access: left I-J   Dialysis Orders: Center: [facility] on MWF. EDW 76.5 kg HD Bath 2K/2.25Ca Time 4 hrs Heparin 7600-U bolus, 2000-U mid HD. Access Left IJ catheter BFR 400 DFR 800 Zemplar 1 mcg IV/HD Epogen 0 Units IV/HD Venofer 100 mg per HD.   Assessment/Plan:  1. S/p L distal femur fx w ORIF 12/22 by Dr. Carola Frost 2. Debility- turned down by CIR, plan is for SNF placement 3. ESRD on hd mwf via IJ cath. Blood cx's negative, no evidence of tunneled HD cath infection. Plan HD Mon-Tues-Fri this week due to holiday - next HD due Friday. 4 K bath 4. WBC increase fever spike 4 am 100.4; CXR new right basilar opacification ? PNA or aspiration. On Zosyn. Also has a perm cath; BC x 2 if she has another fever spike 5. Cdif+- improved, on po Vanc 6. HTN/volume- UF 3 L Tuesday; was on lisinopril 20/hs, doxazosin 8/hs and amlodipine 5/d at home; all are on hold. BP intermittently improved yesterday. 7. VDRF/sepsis- resolved, cx's were negative, presumed due to Cdif 8. 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 No binders or sensipar listed on HD center med list P <3 9. Anemia of CKD- Aranesp increased to 200ug/week. Hb 7.2-up to 8 stable, Jehovah's Witness will not take transfusion of blood products - on q HD IV Fe 10. Nutrition - poor - changed to high protein diet and have added nepro and prostat. NPO now due to nausea and  vomiting. Consider resume CL  Sheffield Slider, PA-C [facility] [phone] 05/31/2012,9:29 AM  LOS: 12 days   Patient seen and examined and agree with assessment and plan as above.  Vinson Moselle  MD [facility] [phone] pgr    [phone] cell 05/31/2012, 11:42 AM     Additional Objective Labs: Basic Metabolic Panel:  Lab 05/31/12 2841 05/30/12 0615 05/29/12 0535 05/28/12 0500 05/27/12 0430  NA 141 138 135 -- --  K 3.7 3.7 3.4* -- --  CL 98 103 99 -- --  CO2 27 22 26  -- --  GLUCOSE 208* 142* 239* -- --  BUN 28* 13 20 -- --  CREATININE 5.32* 2.95* 3.64* -- --  CALCIUM 8.1* 7.6* 8.1* -- --  ALB -- -- -- -- --  PHOS -- -- 2.6 3.5 3.3   Liver Function Tests:  Lab 05/29/12 0535 05/28/12 0500 05/27/12 0430  AST -- -- --  ALT -- -- --  ALKPHOS -- -- --  BILITOT -- -- --  PROT -- -- --  ALBUMIN 2.1* 2.1* 1.9*   CBC:  Lab 05/31/12 0630 05/30/12 0615 05/29/12 0535 05/28/12 1654 05/28/12 0500 05/26/12 0400  WBC 18.9* 13.0* 14.0* -- -- --  NEUTROABS -- -- -- -- --  23.6*  HGB 8.0* 7.2* 7.3* -- -- --  HCT 26.3* 23.4* 23.3* -- -- --  MCV 83.8 83.0 82.3 81.2 80.7 --  PLT 196 152 135* -- -- --   Blood Culture    Component Value Date/Time   SDES BLOOD HAND RIGHT 05/22/2012 0942   SPECREQUEST BOTTLES DRAWN AEROBIC AND ANAEROBIC 10CC EACH 05/22/2012 0942   CULT NO GROWTH 5 DAYS 05/22/2012 0942   REPTSTATUS 05/28/2012 FINAL 05/22/2012 0942  CBG:  Lab 05/31/12 0747 05/31/12 0356 05/30/12 2339 05/30/12 2011 05/30/12 1711  GLUCAP 163* 169* 247* 300* 240*  Studies/Results: Dg Chest Port 1 View  05/31/2012  *RADIOLOGY REPORT*  Clinical Data: Possible aspiration and cough, nausea and vomiting. Decreased O2 saturation.  PORTABLE CHEST - 1 VIEW  Comparison: Chest radiograph performed 05/24/2012  Findings: The lungs are well-aerated.  Mild right basilar airspace opacification raises concern for pneumonia; aspiration could have such an appearance.   There is no evidence of pleural effusion or pneumothorax.  The cardiomediastinal silhouette is normal in size; calcification is noted within the aortic arch.  A left IJ dual lumen catheter is noted ending about the distal SVC.  No acute osseous abnormalities are seen.  IMPRESSION: New mild right basilar airspace opacification, concerning for pneumonia; aspiration could have such an appearance.   Original Report Authenticated By: Tonia Ghent, M.D.    Medications:    . sodium chloride 10 mL/hr at 05/30/12 1132      . darbepoetin (ARANESP) injection - DIALYSIS  200 mcg Intravenous Q Mon-HD  . feeding supplement (NEPRO CARB STEADY)  237 mL Oral BID BM  . feeding supplement  30 mL Oral BID WC  . ferric gluconate (FERRLECIT/NULECIT) IV  125 mg Intravenous Q M,W,F-HD  . insulin aspart  0-15 Units Subcutaneous Q4H  . levothyroxine  75 mcg Oral QAC breakfast  . multivitamin  1 tablet Oral Daily  . niacin  500 mg Oral Daily  . paricalcitol  1 mcg Intravenous 3 times weekly  . piperacillin-tazobactam (ZOSYN)  IV  2.25 g Intravenous Q8H  . sodium chloride  250 mL Intravenous Once  . vancomycin  500 mg Oral Q6H

## 2012-05-31 NOTE — Progress Notes (Signed)
Physical Therapy Treatment Patient Details Name: [name] MRN: [id number] DOB: 09-16-1945 Today's Date: 05/31/2012 Time: 4098-1191 PT Time Calculation (min): 24 min  PT Assessment / Plan / Recommendation Comments on Treatment Session  Pt.'s RN unaware if right femoral cath was tunneled or not.  Confirmed with Nance B, RN that access IS tunneled and pt. able tio mobilize to her tolerance.  [name] much clearer today and able to participate in PT/OT session.      Follow Up Recommendations  SNF     Does the patient have the potential to tolerate intense rehabilitation     Barriers to Discharge        Equipment Recommendations  Rolling walker with 5" wheels;Wheelchair cushion (measurements PT);Wheelchair (measurements PT)    Recommendations for Other Services    Frequency Min 3X/week   Plan Discharge plan remains appropriate;Frequency remains appropriate    Precautions / Restrictions Precautions Precautions: Fall Restrictions Weight Bearing Restrictions: Yes LLE Weight Bearing: Non weight bearing   Pertinent Vitals/Pain Denies pain, no indication of pain with mobility    Mobility  Bed Mobility Bed Mobility: Supine to Sit;Sit to Supine Supine to Sit: 1: +2 Total assist;With rails;HOB elevated Supine to Sit: Patient Percentage: 50% Sitting - Scoot to Edge of Bed: 4: Min guard Sit to Supine: 1: +2 Total assist;HOB flat Sit to Supine: Patient Percentage: 40% Details for Bed Mobility Assistance: Pt. able to initiate movements requested of her, needed assist to complete due to weakness Transfers Transfers: Not assessed Ambulation/Gait Ambulation/Gait Assistance: Not tested (comment)    Exercises Total Joint Exercises Ankle Circles/Pumps: AAROM;Both;10 reps   PT Diagnosis:    PT Problem List:   PT Treatment Interventions:     PT Goals Acute Rehab PT Goals PT Goal: Supine/Side to Sit - Progress: Progressing toward goal PT Goal: Sit to Supine/Side - Progress:  Progressing toward goal  Visit Information  Last PT Received On: 05/31/12 Assistance Needed: +2 PT/OT Co-Evaluation/Treatment: Yes    Subjective Data  Subjective: "I feel like a new woman"   Cognition  Overall Cognitive Status: Appears within functional limits for tasks assessed/performed Arousal/Alertness: Awake/alert Orientation Level: Disoriented to;Time Behavior During Session: [facility] for tasks performed Current Attention Level: Selective Memory Deficits: recalls falling, resulting in fracture Following Commands: Follows one step commands consistently    Balance  Static Sitting Balance Static Sitting - Balance Support: No upper extremity supported;Feet supported Static Sitting - Level of Assistance: 5: Stand by assistance Static Sitting - Comment/# of Minutes: 5  End of Session PT - End of Session Equipment Utilized During Treatment: Gait belt Activity Tolerance: Patient tolerated treatment well Patient left: in bed;with call bell/phone within reach;with bed alarm set Nurse Communication: Weight bearing status;Mobility status   GP     Ferman Hamming 05/31/2012, 9:57 AM Weldon Picking PT Acute Rehab Services [phone] Beeper [phone]

## 2012-05-31 NOTE — Evaluation (Signed)
Clinical/Bedside Swallow Evaluation Patient Details  Name: [name] MRN: [id number] Date of Birth: [birth year]  Today's Date: 05/31/2012 Time: 1200-1215 SLP Time Calculation (min): 15 min  Past Medical History:  Past Medical History  Diagnosis Date  . ESRD on hemodialysis     MWF at [facility] HD. Started HD November 26, 2009. 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  Marland Kitchen COPD (chronic obstructive pulmonary disease)   . Diabetes mellitus   . Hypertension   . Thyroid disease   . Hyperlipidemia   . Bronchitis   . Leg pain   . 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 Peripheral vascular disease   . Hypothyroidism   . Arthritis     knee   Past Surgical History:  Past Surgical History  Procedure Date  . Cholecystectomy   . Dg av dialysis shunt access exist*r* or     working right HD catheter  . Av fistula placement 05/18/2010  . Femur im nail 05/20/2012    Procedure: INTRAMEDULLARY (IM) RETROGRADE FEMORAL NAILING;  Surgeon: Budd Palmer, MD;  Location: [facility] OR;  Service: Orthopedics;  Laterality: Left;   HPI:  67 yr old admitted after fall, femur fracture and I/M nailing 12/22.  Pt. vomited early this am with CXR concerning for pna.  PMH:  COPD, DM, HTN, bronchitis.  Pt. denies previous swallowing difficulty.     Assessment / Plan / Recommendation Clinical Impression  Pt.'s oropharyngeal swallow function appears WFL.  Pt's one risk factor for dysphagia is COPD.  Pt. states she was lying on her side when she vomited and most likely somewhat prone.  SLP suspects pt. may have aspirated during vomiting espisode.  Recommend upgrade diet to regular texture and thin liquids.  No further ST intervention needed.       Aspiration Risk  Mild    Diet Recommendation Regular;Thin liquid   Liquid Administration via: Cup;Straw Medication Administration: Whole meds with liquid Supervision: Patient able to self feed Compensations: Slow rate;Small  sips/bites Postural Changes and/or Swallow Maneuvers: Seated upright 90 degrees    Other  Recommendations Oral Care Recommendations: Oral care BID   Follow Up Recommendations  None    Frequency and Duration           Swallow Study Prior Functional Status          Oral/Motor/Sensory Function Overall Oral Motor/Sensory Function: Appears within functional limits for tasks assessed   Ice Chips Ice chips: Not tested   Thin Liquid Thin Liquid: Within functional limits Presentation: Cup;Straw    Nectar Thick Nectar Thick Liquid: Not tested   Honey Thick Honey Thick Liquid: Not tested   Puree Puree: Within functional limits   Solid       Solid: Within functional limits      Royce Macadamia M.Ed [employer] [phone]  05/31/2012

## 2012-05-31 NOTE — Progress Notes (Signed)
Inpatient Diabetes Program Recommendations  AACE/ADA: New Consensus Statement on Inpatient Glycemic Control (2013)  Target Ranges:  Prepandial:   less than 140 mg/dL      Peak postprandial:   less than 180 mg/dL (1-2 hours)      Critically ill patients:  140 - 180 mg/dL   Reason for Visit: Hyperglycemia  Results for MONEE, DEMBECK (MRN [id number]) as of 05/31/2012 12:14  Ref. Range 05/30/2012 17:11 05/30/2012 20:11 05/30/2012 23:39 05/31/2012 03:56 05/31/2012 07:47 05/31/2012 11:51  Glucose-Capillary Latest Range: 70-99 mg/dL 409 (H) 811 (H) 914 (H) 169 (H) 163 (H) 185 (H)      Note: Consider adding Lantus 8 units QHS.  Will continue to follow. Thank you. Ailene Ards, RD, LDN, CDE Inpatient Diabetes Coordinator [phone]

## 2012-05-31 NOTE — Progress Notes (Signed)
Event: 0200:  Notified by RN that pt had onset of persistent coughing, productive of copious thick white secretions.  She then began having n/v that has not been relieved by Zofran. There was concern for possible aspiration. IV phenergan and stat PCXR ordered. NP to bedside. Subjective: Pt now stating she is feeling better. Objective: Pt is a 67 y/o female with ERSD admitted from [facility] s/p mechanical fall with L hip fracture and was transferred to [facility] and ortho consulted for IM nailing 12/22. On 12/26 she was noted to have fever of 103, tachycardia, dyspnea and change in mental status concerning for sepsis. PCCM consulted for sepsis. Found to have c diff colitis & septic shock requiring mechanical ventialtion 12/26>>1227., was transferred back to [facility]  12/31. At bedside pt now noted resting quietly in NAD. Skin w/d, color normal. CXR reveals findings concerning for PNA vs aspiration. This is new from CXR on 05/24/12. ABG WNL. BBS w/ occasional rhonchi that clears w/ coughing. Abd soft nt w/ + BS. Current temp 100.4 (pt had been afebrile >24 hrs). Remaining VS BP-112/68, P-95, R-22 w/ 02 sats of 97% on 2L [place].  Assessment/Plan: 1. Persistent productive cough: (Improved w/ Tussionex) CXR findings c/w PNA vs aspiration. Zosyn added to Vancomycin to cover for HAP. Pt made NPO for now. Temp also trending up which would support new infectious process. 2. Nausea and vomiting: (Improved w/ Phenergan) NPO for now. Unsure if coughing precipitated N/V or vice versa. Abd exam unremarkable. Will continue IV Phenergan and IV Zofran to keep N/V under control. Since pt currently appears very stable and feeling better, will defer further changes in plan of care to AM rounding MD.   Leanne Chang, NP-C [facility] Pager [phone]

## 2012-05-31 NOTE — Progress Notes (Signed)
Occupational Therapy Treatment Patient Details Name: [name] MRN: [id number] DOB: 28-Aug-1945 Today's Date: 05/31/2012 Time: 4098-1191 OT Time Calculation (min): 23 min  OT Assessment / Plan / Recommendation Comments on Treatment Session OOB activity limited during session as RN unsure if pt's right femoral cath was tunneled. Able to verify after session from RN (Nance B) that pt ok to mobilize OOB (catheter is tunneled).  Pt much more alert and oriented today.    Follow Up Recommendations  SNF    Barriers to Discharge       Equipment Recommendations  None recommended by OT    Recommendations for Other Services    Frequency Min 2X/week   Plan Discharge plan remains appropriate    Precautions / Restrictions Precautions Precautions: Fall Restrictions Weight Bearing Restrictions: Yes LLE Weight Bearing: Non weight bearing   Pertinent Vitals/Pain See vitals    ADL  Grooming: Performed;Wash/dry face;Set up Where Assessed - Grooming: Unsupported sitting Upper Body Dressing: Performed;Set up Where Assessed - Upper Body Dressing: Unsupported sitting Equipment Used: Gait belt ADL Comments: Pt sat EOB 5 minutes performing grooming task. RN unsure if pt's right femoral catheter was tunneled.  Session limited to bedside until receiving further clarification for OOB mobility.    OT Diagnosis:    OT Problem List:   OT Treatment Interventions:     OT Goals Miscellaneous OT Goals Miscellaneous OT Goal #1: Pt will transfer from supine to sit EOB with mod assist in preparation for selfcare tasks. OT Goal: Miscellaneous Goal #1 - Progress: Goal set today  Visit Information  Last OT Received On: 05/31/12 Assistance Needed: +2    Subjective Data      Prior Functioning       Cognition  Overall Cognitive Status: Appears within functional limits for tasks assessed/performed Arousal/Alertness: Awake/alert Orientation Level: Disoriented to;Time Behavior During Session: [facility] for  tasks performed Current Attention Level: Selective Memory Deficits: recalls falling, resulting in fracture Following Commands: Follows one step commands consistently    Mobility  Shoulder Instructions Bed Mobility Bed Mobility: Supine to Sit;Sit to Supine Supine to Sit: 1: +2 Total assist;With rails;HOB elevated Supine to Sit: Patient Percentage: 50% Sitting - Scoot to Edge of Bed: 4: Min guard Sit to Supine: 1: +2 Total assist;HOB flat Sit to Supine: Patient Percentage: 40% Details for Bed Mobility Assistance: Pt. able to initiate movements requested of her, needed assist to complete due to weakness       Exercises  Total Joint Exercises Ankle Circles/Pumps: AAROM;Both;10 reps   Balance Static Sitting Balance Static Sitting - Balance Support: No upper extremity supported;Feet supported Static Sitting - Level of Assistance: 5: Stand by assistance Static Sitting - Comment/# of Minutes: 5   End of Session OT - End of Session Activity Tolerance: Patient tolerated treatment well Patient left: in bed;with call bell/phone within reach;with bed alarm set Nurse Communication: Mobility status  GO   05/31/2012 Cipriano Mile OTR/L Pager [phone] Office [phone]   Cipriano Mile 05/31/2012, 12:58 PM

## 2012-05-31 NOTE — Progress Notes (Signed)
MD returned page. Per md order, notify if MAP <60. Will continue to monitor.

## 2012-05-31 NOTE — Clinical Social Work Note (Signed)
Clinical Social Work   CSW met with pt to address discharge plan. Pt inquired about [facility], however they did not make a bed offer. Pt stated that she did not have a second choice, as pt is not familiar with the facilities. Pt stated that her family will be visiting her this evening and she will ask for assistance with bed choice then. CSW will continue to follow.   Dede Query, MSW, LCSWA [phone] (Coverage for Genelle Bal, MSW, LCSW)

## 2012-06-01 ENCOUNTER — Inpatient Hospital Stay (HOSPITAL_COMMUNITY): Payer: Medicare Other

## 2012-06-01 DIAGNOSIS — D509 Iron deficiency anemia, unspecified: Secondary | ICD-10-CM

## 2012-06-01 DIAGNOSIS — R109 Unspecified abdominal pain: Secondary | ICD-10-CM

## 2012-06-01 DIAGNOSIS — N186 End stage renal disease: Secondary | ICD-10-CM

## 2012-06-01 LAB — CBC
HCT: 27.8 % — ABNORMAL LOW (ref 36.0–46.0)
RBC: 3.26 MIL/uL — ABNORMAL LOW (ref 3.87–5.11)
RDW: 20.7 % — ABNORMAL HIGH (ref 11.5–15.5)
WBC: 18.4 10*3/uL — ABNORMAL HIGH (ref 4.0–10.5)

## 2012-06-01 LAB — GLUCOSE, CAPILLARY
Glucose-Capillary: 156 mg/dL — ABNORMAL HIGH (ref 70–99)
Glucose-Capillary: 197 mg/dL — ABNORMAL HIGH (ref 70–99)

## 2012-06-01 LAB — BASIC METABOLIC PANEL
BUN: 45 mg/dL — ABNORMAL HIGH (ref 6–23)
Chloride: 93 mEq/L — ABNORMAL LOW (ref 96–112)
GFR calc Af Amer: 6 mL/min — ABNORMAL LOW (ref >90)
Potassium: 5.9 mEq/L — ABNORMAL HIGH (ref 3.5–5.1)

## 2012-06-01 MED ORDER — LEVOFLOXACIN 500 MG PO TABS
500.0000 mg | ORAL_TABLET | ORAL | Status: DC
  Administered 2012-06-02 – 2012-06-04 (×2): 500 mg via ORAL
  Filled 2012-06-01 (×2): qty 1

## 2012-06-01 MED ORDER — HYDROCOD POLST-CHLORPHEN POLST 10-8 MG/5ML PO LQCR: 5.0000 mL | Freq: Two times a day (BID) | ORAL | Status: DC | PRN

## 2012-06-01 MED ORDER — METHOCARBAMOL 500 MG PO TABS: 500.0000 mg | ORAL_TABLET | Freq: Four times a day (QID) | ORAL | Status: DC | PRN

## 2012-06-01 MED ORDER — VANCOMYCIN 50 MG/ML ORAL SOLUTION: 500.0000 mg | Freq: Four times a day (QID) | ORAL | Status: DC

## 2012-06-01 MED ORDER — ONDANSETRON HCL 4 MG PO TABS: 4.0000 mg | ORAL_TABLET | Freq: Four times a day (QID) | ORAL | Status: DC | PRN

## 2012-06-01 MED ORDER — OXYCODONE HCL 5 MG PO TABS: 5.0000 mg | ORAL_TABLET | ORAL | Status: DC | PRN

## 2012-06-01 MED ORDER — LEVOFLOXACIN 500 MG PO TABS: 500.0000 mg | ORAL_TABLET | ORAL | Status: DC

## 2012-06-01 MED ORDER — SODIUM CHLORIDE 0.9 % IV SOLN: 125.0000 mg | INTRAVENOUS | Status: DC

## 2012-06-01 MED ORDER — NEPRO/CARBSTEADY PO LIQD: 237.0000 mL | Freq: Two times a day (BID) | ORAL | Status: DC

## 2012-06-01 MED ORDER — INSULIN ASPART 100 UNIT/ML ~~LOC~~ SOLN
0.0000 [IU] | SUBCUTANEOUS | Status: DC
Start: 2012-06-01 — End: 2017-01-06

## 2012-06-01 MED ORDER — PRO-STAT SUGAR FREE PO LIQD: 30.0000 mL | Freq: Two times a day (BID) | ORAL | Status: DC

## 2012-06-01 MED ORDER — METOCLOPRAMIDE HCL 5 MG PO TABS: 5.0000 mg | ORAL_TABLET | Freq: Three times a day (TID) | ORAL | Status: AC | PRN

## 2012-06-01 NOTE — Progress Notes (Signed)
Orthopaedic Trauma Service (OTS)  Subjective: 12 Days Post-Op Procedure(s) (LRB): INTRAMEDULLARY (IM) RETROGRADE FEMORAL NAILING (Left)  Doing better Working with therapies Up in chair  Dialysis later today   Objective: Current Vitals Blood pressure 99/59, pulse 94, temperature 99.1 F (37.3 C), temperature source Oral, resp. rate 20, height 5\' 3"  (1.6 m), weight 77.1 kg (169 lb 15.6 oz), SpO2 100.00%. Vital signs in last 24 hours: Temp:  [97.8 F (36.6 C)-99.1 F (37.3 C)] 99.1 F (37.3 C) (01/03 0410) Pulse Rate:  [78-96] 94  (01/03 0410) Resp:  [20-22] 20  (01/03 0410) BP: (82-110)/(46-67) 99/59 mmHg (01/03 0410) SpO2:  [95 %-100 %] 100 % (01/03 0410) Weight:  [77.1 kg (169 lb 15.6 oz)] 77.1 kg (169 lb 15.6 oz) (01/02 1954)  Intake/Output from previous day: 01/02 0701 - 01/03 0700 In: 430 [P.O.:120; I.V.:210; IV Piggyback:100] Out: 403 [Stool:403] Intake/Output      01/02 0701 - 01/03 0700 01/03 0701 - 01/04 0700   P.O. 120    I.V. (mL/kg) 210 (2.7)    IV Piggyback 100    Total Intake(mL/kg) 430 (5.6)    Stool 403    Total Output 403    Net +27         Urine Occurrence        LABS  Basename 05/31/12 0630 05/30/12 0615  HGB 8.0* 7.2*    Basename 05/31/12 0630 05/30/12 0615  WBC 18.9* 13.0*  RBC 3.14* 2.82*  HCT 26.3* 23.4*  PLT 196 152    Basename 05/31/12 0630 05/30/12 0615  NA 141 138  K 3.7 3.7  CL 98 103  CO2 27 22  BUN 28* 13  CREATININE 5.32* 2.95*  GLUCOSE 208* 142*  CALCIUM 8.1* 7.6*   No results found for this basename: LABPT:2,INR:2 in the last 72 hours   Physical Exam  Gen: awake, mentals status appears to be clearing, NAD Ext:      Left Lower Extremity    Incisions stable, clean  Minimal swelling of knee and leg    Minimal tenderness with palpation of distal femur  Distal motor and sensory functions grossly intact    + DP pulse   Imaging Dg Chest Port 1 View  05/31/2012  *RADIOLOGY REPORT*  Clinical Data: Possible  aspiration and cough, nausea and vomiting. Decreased O2 saturation.  PORTABLE CHEST - 1 VIEW  Comparison: Chest radiograph performed 05/24/2012  Findings: The lungs are well-aerated.  Mild right basilar airspace opacification raises concern for pneumonia; aspiration could have such an appearance.  There is no evidence of pleural effusion or pneumothorax.  The cardiomediastinal silhouette is normal in size; calcification is noted within the aortic arch.  A left IJ dual lumen catheter is noted ending about the distal SVC.  No acute osseous abnormalities are seen.  IMPRESSION: New mild right basilar airspace opacification, concerning for pneumonia; aspiration could have such an appearance.   Original Report Authenticated By: Tonia Ghent, M.D.     Assessment/Plan: 12 Days Post-Op Procedure(s) (LRB): INTRAMEDULLARY (IM) RETROGRADE FEMORAL NAILING (Left)  67 y/o female s/p fall   1. Fall  2. L supracondylar distal femur fracture POD 12  NWB   Unrestricted L knee ROM   Continue with PT/OT   No pillows directly under knee, float/elevate leg by placing pillows under ankle   Float heels off bed, pt at risk for pressure ulcers   Dressing changes prn   Will check films of L femur as we are nearing 2 weeks post  op  Continue with sutures for another 5-7 days 3. Sepsis/ C.diff colitis   resolving   Per critical care/IM   On po vanc for c. diff 4. Aspiration PNA  levaquin   zosyn 5. ESRD   Per nephrology  6. ABL anemia   Stable   Pt is a Jehovah's Witness  7. Dvt/pe prophylaxis   Holding pharmacologics due to + FOB on 12/19   SCD's  8. Dispo   Continue with therapies   Will need SNF at d/c given profound deconditioning and need for intensive therapies   Ortho issues stable   Mearl Latin, PA-C [facility] [phone] (P) 06/01/2012, 8:44 AM

## 2012-06-01 NOTE — Progress Notes (Signed)
Occupational Therapy Treatment Patient Details Name: [name] MRN: [id number] DOB: [birth year] Today's Date: 06/01/2012 Time: 4098-1191 OT Time Calculation (min): 29 min  OT Assessment / Plan / Recommendation Comments on Treatment Session Pt demonstrating increased activity tolerance and independence with sit<>stand transfer.    Follow Up Recommendations  SNF    Barriers to Discharge       Equipment Recommendations  None recommended by OT    Recommendations for Other Services    Frequency Min 2X/week   Plan Discharge plan remains appropriate    Precautions / Restrictions Precautions Precautions: Fall Restrictions Weight Bearing Restrictions: Yes LLE Weight Bearing: Non weight bearing   Pertinent Vitals/Pain See vitals    ADL  Eating/Feeding: Performed;Independent Where Assessed - Eating/Feeding: Chair Toilet Transfer: Simulated;+2 Total assistance Toilet Transfer: Patient Percentage: 50% Toilet Transfer Method: Sit to Barista:  (chair) Equipment Used: Gait belt;Rolling walker Transfers/Ambulation Related to ADLs: +2 assist for sit<>stand from chair x2.  Mod assist to LLE to maintain non wbing status. ADL Comments: Focus of session on sit<>stand transfers as precursor for functional toilet transfers.    OT Diagnosis:    OT Problem List:   OT Treatment Interventions:     OT Goals ADL Goals Pt Will Transfer to Toilet: with mod assist;Maintaining weight bearing status;with DME;3-in-1 ADL Goal: Toilet Transfer - Progress: Progressing toward goals  Visit Information  Last OT Received On: 06/01/12 Assistance Needed: +2    Subjective Data      Prior Functioning       Cognition  Overall Cognitive Status: Appears within functional limits for tasks assessed/performed Arousal/Alertness: Awake/alert Orientation Level: Disoriented to;Time Behavior During Session: [facility] for tasks performed Current Attention Level: Selective Following  Commands: Follows one step commands consistently    Mobility  Shoulder Instructions Bed Mobility Bed Mobility: Not assessed (pt up in chair) Transfers Transfers: Sit to Stand;Stand to Sit Sit to Stand: 1: +2 Total assist;From chair/3-in-1;With upper extremity assist;With armrests Sit to Stand: Patient Percentage: 50% Stand to Sit: 1: +2 Total assist;To chair/3-in-1;With upper extremity assist;With armrests Stand to Sit: Patient Percentage: 50%       Exercises      Balance Balance Balance Assessed: Yes Static Standing Balance Static Standing - Balance Support: Bilateral upper extremity supported Static Standing - Level of Assistance: 1: +2 Total assist Static Standing - Comment/# of Minutes: +2 assist for steadying and to maintain NWBing status.  Cues to bil posterior hips and trunk to facilitate upright posture.  Pt stood x2  ~1 min each.   End of Session OT - End of Session Equipment Utilized During Treatment: Gait belt Activity Tolerance: Patient tolerated treatment well Patient left: in chair;with call bell/phone within reach Nurse Communication: Mobility status  GO    06/01/2012 Cipriano Mile OTR/L Pager [phone] Office [phone]  Cipriano Mile 06/01/2012, 9:15 AM

## 2012-06-01 NOTE — Discharge Summary (Signed)
Physician Discharge Summary  Patient ID: TIEA MANNINEN MRN: [id number] DOB/AGE: May 02, 1946 67 y.o.  Admit date: 05/19/2012 Discharge date: 06/01/2012  Primary Care Physician:  Avon Gully, MD  Discharge Diagnoses:   L. Distal Femur Fracture, supracondylar  . ANEMIA OF RENAL FAILURE . RENAL DISEASE, CHRONIC, STAGE V/ end-stage renal disease on hemodialysis  . HYPERTENSION . Femur fracture, left . Fever . Acute respiratory failure with aspiration pneumonia  . Clostridium difficile colitis . Septic shock(785.52) Metabolic encephalopathy  Consults: Pulmonary critical care service                   Nephrology                   Orthopedics, Dr. Carola Frost  Discharge Medications:   Medication List     As of 06/01/2012  2:24 PM    STOP taking these medications         bisacodyl 5 MG EC tablet   Commonly known as: DULCOLAX      doxazosin 8 MG tablet   Commonly known as: CARDURA      glipiZIDE 10 MG tablet   Commonly known as: GLUCOTROL      insulin lispro protamine-insulin lispro (75-25) 100 UNIT/ML Susp   Commonly known as: HUMALOG 75/25      lisinopril 20 MG tablet   Commonly known as: PRINIVIL,ZESTRIL      TAKE these medications         chlorpheniramine-HYDROcodone 10-8 MG/5ML Lqcr   Commonly known as: TUSSIONEX   Take 5 mLs by mouth every 12 (twelve) hours as needed (cough).      feeding supplement (NEPRO CARB STEADY) Liqd   Take 237 mLs by mouth 2 (two) times daily between meals.      feeding supplement Liqd   Take 30 mLs by mouth 2 (two) times daily with breakfast and lunch.      fluticasone 50 MCG/ACT nasal spray   Commonly known as: FLONASE   Place 2 sprays into the nose Daily.      insulin aspart 100 UNIT/ML injection   Commonly known as: novoLOG   Inject 0-15 Units into the skin every 4 (four) hours. CBG < 70: Drink juice; CBG 70 - 120: 0 units: CBG 121 - 150: 2 units; CBG 151 - 200: 3 units; CBG 201 - 250: 5 units; CBG 251 - 300: 8 units;CBG 301 - 350:  11 units; CBG 351 - 400: 15 units; CBG > 400 : 15 units and Call MD      levofloxacin 500 MG tablet   Commonly known as: LEVAQUIN   Take 1 tablet (500 mg total) by mouth every other day. X 7 more doses      levothyroxine 75 MCG tablet   Commonly known as: SYNTHROID, LEVOTHROID   Take 75 mcg by mouth daily.      methocarbamol 500 MG tablet   Commonly known as: ROBAXIN   Take 1 tablet (500 mg total) by mouth every 6 (six) hours as needed.      metoCLOPramide 5 MG tablet   Commonly known as: REGLAN   Take 1-2 tablets (5-10 mg total) by mouth every 8 (eight) hours as needed (if ondansetron (ZOFRAN) ineffective.).      multivitamin Tabs tablet   Take 1 tablet by mouth daily.      niacin 500 MG CR tablet   Commonly known as: NIASPAN   Take 500 mg by mouth daily.      ondansetron  4 MG tablet   Commonly known as: ZOFRAN   Take 1 tablet (4 mg total) by mouth every 6 (six) hours as needed for nausea.      oxyCODONE 5 MG immediate release tablet   Commonly known as: Oxy IR/ROXICODONE   Take 1 tablet (5 mg total) by mouth every 4 (four) hours as needed.      sodium chloride 0.9 % SOLN 100 mL with ferric gluconate 12.5 MG/ML SOLN 125 mg   Inject 125 mg into the vein every Monday, Wednesday, and Friday with hemodialysis.      vancomycin 50 mg/mL oral solution   Commonly known as: VANCOCIN   Take 10 mLs (500 mg total) by mouth every 6 (six) hours. X 14 days         Brief H and P: For complete details please refer to admission H and P, but in brief 67 yo female esrd hd m/w/f and sometimes sat was getting up out of bed tonight when she tripped and fell and hurt her left leg. Presented to [facility] ED and found to have femur fracture so transferred here for further care. Orthopedics was called here at [facility] (no ortho coverage at [facility] on weekend)who requested transfer.  Last dialysis Friday.    Hospital Course:  Pt is a 67 y/o female with ERSD admitted from [facility] ED s/p mechanical fall with L hip  fracture and was transferred to [facility] and ortho consulted for IM nailing 12/22. On 12/26 she was noted to have fever of 103, tachycardia, dyspnea and change in mental status concerning for sepsis. PCCM was consulted for sepsis. Found to have c diff colitis & septic shock requiring mechanical ventialtion 12/26>>12/27. L. Distal Femur Fracture, supracondylar  -s/p IM nailing on 12/22 per Dr Carola Frost, follow-up out-patient with Dr Carola Frost in 10 days after DC. PT/OT evaluations recommended SNF for rehab.   Aspiration pneumonia  Started on Zosyn on 06/01/2011. CXR on 06/01/2011 showed new mild right basilar airspace opacification concerning for pneumonia. Discontinued zosyn and placed on oral Levofloxacin for 7 doses. Repeat chest X-ray can be obtained outpatient in next 2 weeks for complete resolution.   Acute Resp failure/sepsis with metabolic encephalopathy: resolved  Intubated 12/26>12/27 per CCM. Resolved. Was found to be C.DIFF positive on 05/24/12 which was likely the source of septic shock, improved on abx. Continue oral vanc. Initial procalcitonin 17.02, lactic acid 1.1. Blood cultures has been negative so far.   C.diff colitis  CT abd and pelvis with diffuse colitis--no perforation or abscess. Clinically improving. Continue oral vancomycin every 6 hours, plan 14 days for now, may need a longer course given patient has been started on antibiotics for Aspiration pneumonia.  -PO vanc 12/26>>>  -IV Flagyl 12/26>>>12/29  -PO Flagyl 12/29>>>05/30/12   ESRD  Dialysis per renal, patient is on Monday-Wed-Friday HD  Anemia  Secondary to #4  On aranesp, she is Jehovah's witness>> no blood transfusion   HTN  Outpt meds on hold for now as BPs improved but still soft, monitor resume when appropriate. BP normotensive.   Decondtioning  PT recommending SNF    Day of Discharge BP 101/47  Pulse 95  Temp 99.1 F (37.3 C) (Oral)  Resp 18  Ht 5\' 3"  (1.6 m)  Wt 77.1 kg (169 lb 15.6 oz)  BMI 30.11 kg/m2  SpO2  96%  Physical Exam: General: Alert and awake oriented x3 not in any acute distress. HEENT: anicteric sclera, pupils reactive to light and accommodation CVS: S1-S2 clear no  murmur rubs or gallops Chest: dec breath sounds at bases  Abdomen: soft nontender, nondistended, normal bowel sounds Extremities: no cyanosis, clubbing or edema noted bilaterally   The results of significant diagnostics from this hospitalization (including imaging, microbiology, ancillary and laboratory) are listed below for reference.    LAB RESULTS: Basic Metabolic Panel:  Lab 05/31/12 1610 05/30/12 0615 05/29/12 0535 05/28/12 1654  NA 141 138 -- --  K 3.7 3.7 -- --  CL 98 103 -- --  CO2 27 22 -- --  GLUCOSE 208* 142* -- --  BUN 28* 13 -- --  CREATININE 5.32* 2.95* -- --  CALCIUM 8.1* 7.6* -- --  MG -- -- -- 1.9  PHOS -- -- 2.6 --   Liver Function Tests:  Lab 05/29/12 0535 05/28/12 0500  AST -- --  ALT -- --  ALKPHOS -- --  BILITOT -- --  PROT -- --  ALBUMIN 2.1* 2.1*   CBC:  Lab 05/31/12 0630 05/30/12 0615 05/26/12 0400  WBC 18.9* 13.0* --  NEUTROABS -- -- 23.6*  HGB 8.0* 7.2* --  HCT 26.3* 23.4* --  MCV 83.8 -- --  PLT 196 152 --   CBG:  Lab 06/01/12 1159 06/01/12 0748  GLUCAP 188* 156*    Significant Diagnostic Studies:  Dg Chest 1 View  05/19/2012  *RADIOLOGY REPORT*  Clinical Data: Larey Seat from bed  CHEST - 1 VIEW  Comparison: 02/27/2012  Findings: Left jugular dual-lumen central venous catheter tip projecting over cavoatrial junction. Enlargement of cardiac silhouette with pulmonary vascular congestion. Mild diffuse increased interstitial markings in both lungs question minimal edema. No segmental consolidation, pleural effusion or pneumothorax. Bones demineralized.  IMPRESSION: Enlargement of cardiac silhouette with pulmonary vascular congestion. Question minimal pulmonary edema.   Original Report Authenticated By: Ulyses Southward, M.D.    Dg Femur Left  05/20/2012  *RADIOLOGY REPORT*   Clinical Data: Post retrograde line and nail of the left femur  DG C-ARM 61-120 MIN, LEFT FEMUR - 2 VIEW  Comparison:  Left femur radiographs - 05/19/2012  Findings:  Six spot intraoperative fluoroscopic images of the left femur are provided for review.  Images demonstrate retrograde intramedullary nail fixation of previously noted distal diaphyseal/metaphyseal comminuted femur fracture. Alignment is much improved.  The distal end of the intramedullary rod is transfixed with three cancellous screws.  The proximal end of the femoral rods transfixed with two cancellous screws.  Skin staples overlying the proximal thigh. No radiopaque foreign body.  IMPRESSION: Post intramedullary rod fixation of comminuted distal diaphyseal/metaphyseal femoral fracture.   Original Report Authenticated By: Tacey Ruiz, MD    Dg Femur Left  05/19/2012  *RADIOLOGY REPORT*  Clinical Data: Fall  LEFT FEMUR - 2 VIEW  Comparison: None.  Findings: Three views of the left femur submitted.  There is displaced comminuted fracture in distal left femoral shaft.  IMPRESSION: Displaced comminuted fracture distal left femoral shaft.   Original Report Authenticated By: Natasha Mead, M.D.    Dg Tibia/fibula Left  05/19/2012  *RADIOLOGY REPORT*  Clinical Data: Fall  LEFT TIBIA AND FIBULA - 2 VIEW  Comparison: None.  Findings: Two views of the left tibia-fibula submitted.  No acute fracture or subluxation.  Atherosclerotic vascular calcifications are noted.  IMPRESSION: No acute fracture or subluxation.   Original Report Authenticated By: Natasha Mead, M.D.    Dg Femur Left Port  05/20/2012  *RADIOLOGY REPORT*  Clinical Data: Status post ORIF.  PORTABLE LEFT FEMUR - 2 VIEW  Comparison: Intraoperative fluoro spot  images 05/20/2012.  Findings: The patient is status post placement of an intramedullary rod in the left femur.  Three distal and two proximal interlocking screws are in place.  There is slight anterior displacement of approximately one  cortex width of the distal comminuted fracture. A moderate sized joint effusion is present.  Atherosclerotic calcifications are present within the femoral artery.  IMPRESSION:  1.  Near anatomic reduction of the comminuted distal femur fracture status post ORIF. 2.  No radiographic evidence for complication. 3.  Atherosclerosis.   Original Report Authenticated By: Marin Roberts, M.D.    Dg Knee Complete 4 Views Left  05/19/2012  *RADIOLOGY REPORT*  Clinical Data: Fall  LEFT KNEE - COMPLETE 4+ VIEW  Comparison: Left femur same day  Findings: Two views of the left knee submitted.  No knee fracture or subluxation.  Again noted displaced comminuted fracture distal left femoral shaft.  IMPRESSION: No knee fracture or subluxation.  Again noted displaced comminuted fracture distal left femoral shaft.   Original Report Authenticated By: Natasha Mead, M.D.    Dg C-arm 61-120 Min  05/20/2012  *RADIOLOGY REPORT*  Clinical Data: Post retrograde line and nail of the left femur  DG C-ARM 61-120 MIN, LEFT FEMUR - 2 VIEW  Comparison:  Left femur radiographs - 05/19/2012  Findings:  Six spot intraoperative fluoroscopic images of the left femur are provided for review.  Images demonstrate retrograde intramedullary nail fixation of previously noted distal diaphyseal/metaphyseal comminuted femur fracture. Alignment is much improved.  The distal end of the intramedullary rod is transfixed with three cancellous screws.  The proximal end of the femoral rods transfixed with two cancellous screws.  Skin staples overlying the proximal thigh. No radiopaque foreign body.  IMPRESSION: Post intramedullary rod fixation of comminuted distal diaphyseal/metaphyseal femoral fracture.   Original Report Authenticated By: Tacey Ruiz, MD      Disposition and Follow-up:     Discharge Orders    Future Orders Please Complete By Expires   Diet Carb Modified      Non weight bearing      Comments:   Left Leg   Increase activity slowly       PT range of motion   05/22/13   Comments:   Unrestricted ROM L hip, knee and ankle       DISPOSITION: Skilled nursing facility  DIET: Carb modified diet  ACTIVITY: As tolerated with physical therapy  DISCHARGE FOLLOW-UP Follow-up Information    Follow up with HANDY,MICHAEL H, MD. Schedule an appointment as soon as possible for a visit in 10 days.   Contact information:   [address] [place] [address] [phone]       Follow up with [facility], MD. Schedule an appointment as soon as possible for a visit in 10 days. (for hospital follow-up)    Contact information:   [address] [place] [address] [phone]          Time spent on Discharge: 40 mins  Signed:   [name],RIPUDEEP M.D. [facility] 06/01/2012, 2:24 PM Pager: [phone]

## 2012-06-01 NOTE — Progress Notes (Signed)
Physical Therapy Treatment Patient Details Name: POSEY JASMIN MRN: [id number] DOB: 1945/08/27 Today's Date: 06/01/2012 Time: 4098-1191 PT Time Calculation (min): 28 min  PT Assessment / Plan / Recommendation Comments on Treatment Session  Pt. able to tolerate 2 attempts at sit<>stand, fatigues easily.  She makes an effort to comply with NWB but needs manual assist to achieve full NWB.  Mentation is improving.    Follow Up Recommendations  SNF     Does the patient have the potential to tolerate intense rehabilitation     Barriers to Discharge        Equipment Recommendations  Rolling walker with 5" wheels;Wheelchair cushion (measurements PT);Wheelchair (measurements PT)    Recommendations for Other Services    Frequency Min 3X/week   Plan Discharge plan remains appropriate;Frequency remains appropriate    Precautions / Restrictions Precautions Precautions: Fall Restrictions Weight Bearing Restrictions: Yes LLE Weight Bearing: Non weight bearing   Pertinent Vitals/Pain Denies pain, no distress    Mobility  Bed Mobility Bed Mobility: Not assessed Transfers Transfers: Sit to Stand;Stand to Sit Sit to Stand: 1: +2 Total assist;From chair/3-in-1;With upper extremity assist;With armrests Sit to Stand: Patient Percentage: 50% Stand to Sit: 1: +2 Total assist;To chair/3-in-1;With upper extremity assist;With armrests Stand to Sit: Patient Percentage: 50% Details for Transfer Assistance: vc's for technique and precautions, pt. making effort to complybut needs manual assist to maintain NWB status Ambulation/Gait Ambulation/Gait Assistance: Not tested (comment) Assistive device: Rolling walker    Exercises Total Joint Exercises Ankle Circles/Pumps: AROM;Both;10 reps;Seated Long Arc Quad: AAROM;Left;5 reps;Seated   PT Diagnosis:    PT Problem List:   PT Treatment Interventions:     PT Goals Acute Rehab PT Goals PT Goal: Sit to Stand - Progress: Progressing toward goal PT  Goal: Stand to Sit - Progress: Progressing toward goal  Visit Information  Last PT Received On: 06/01/12 Assistance Needed: +2    Subjective Data  Subjective: "I think I am doing better each day"   Cognition  Overall Cognitive Status: Appears within functional limits for tasks assessed/performed Arousal/Alertness: Awake/alert Orientation Level: Appears intact for tasks assessed Behavior During Session: [facility] for tasks performed Following Commands: Follows one step commands consistently Safety/Judgement: Other (comment) (improving awareness of NWB precautions)    Balance     End of Session PT - End of Session Equipment Utilized During Treatment: Gait belt Activity Tolerance: Patient tolerated treatment well Patient left: in chair;with call bell/phone within reach Nurse Communication: Weight bearing status;Mobility status   GP     Ferman Hamming 06/01/2012, 12:04 PM Weldon Picking PT Acute Rehab Services [phone] Beeper [phone]

## 2012-06-01 NOTE — Progress Notes (Signed)
Subjective:  Feeling better, for hd today Objective Vital signs in last 24 hours: Filed Vitals:   05/31/12 1954 05/31/12 2020 06/01/12 0410 06/01/12 1417  BP: 82/46 96/49 99/59  101/47  Pulse: 93 96 94 95  Temp: 98.8 F (37.1 C)  99.1 F (37.3 C) 99.1 F (37.3 C)  TempSrc: Oral  Oral Oral  Resp: 22 20 20 18   Height:      Weight: 77.1 kg (169 lb 15.6 oz)     SpO2: 95% 95% 100% 96%   Weight change: 0.2 kg (7.1 oz)  Intake/Output Summary (Last 24 hours) at 06/01/12 1526 Last data filed at 06/01/12 1500  Gross per 24 hour  Intake    590 ml  Output    400 ml  Net    190 ml   Labs: Basic Metabolic Panel:  Lab 05/31/12 0981 05/30/12 0615 05/29/12 0535 05/28/12 0500 05/27/12 0430  NA 141 138 135 -- --  K 3.7 3.7 3.4* -- --  CL 98 103 99 -- --  CO2 27 22 26  -- --  GLUCOSE 208* 142* 239* -- --  BUN 28* 13 20 -- --  CREATININE 5.32* 2.95* 3.64* -- --  CALCIUM 8.1* 7.6* 8.1* -- --  ALB -- -- -- -- --  PHOS -- -- 2.6 3.5 3.3   Liver Function Tests:  Lab 05/29/12 0535 05/28/12 0500 05/27/12 0430  AST -- -- --  ALT -- -- --  ALKPHOS -- -- --  BILITOT -- -- --  PROT -- -- --  ALBUMIN 2.1* 2.1* 1.9*   No results found for this basename: LIPASE:3,AMYLASE:3 in the last 168 hours No results found for this basename: AMMONIA:3 in the last 168 hours CBC:  Lab 05/31/12 0630 05/30/12 0615 05/29/12 0535 05/28/12 1654 05/28/12 0500 05/26/12 0400  WBC 18.9* 13.0* 14.0* -- -- --  NEUTROABS -- -- -- -- -- 23.6*  HGB 8.0* 7.2* 7.3* -- -- --  HCT 26.3* 23.4* 23.3* -- -- --  MCV 83.8 83.0 82.3 81.2 80.7 --  PLT 196 152 135* -- -- --   Cardiac Enzymes: No results found for this basename: CKTOTAL:5,CKMB:5,CKMBINDEX:5,TROPONINI:5 in the last 168 hours CBG:  Lab 06/01/12 1159 06/01/12 0748 06/01/12 0416 05/31/12 2355 05/31/12 1958  GLUCAP 188* 156* 138* 131* 350*    Iron Studies: No results found for this basename: IRON,TIBC,TRANSFERRIN,FERRITIN in the last 72  hours Studies/Results: Dg Femur Left  06/01/2012  *RADIOLOGY REPORT*  Clinical Data: Follow-up femur fracture with reduction  LEFT FEMUR - 2 VIEW  Comparison: 05/20/2012  Findings: Intramedullary nail extends from the distal femur to the proximal metaphysis.  There are proximal and distal securing screws.  Comminuted fracture at the distal diaphyseal metaphyseal junction region appears the same as on the previous study.  Overall alignment is near anatomic.  Position of the fracture fragments is unchanged.  IMPRESSION: No change since 05/20/2012.  Intramedullary nail placement for treatment of distal femur fracture.   Original Report Authenticated By: Paulina Fusi, M.D.    Dg Chest Port 1 View  05/31/2012  *RADIOLOGY REPORT*  Clinical Data: Possible aspiration and cough, nausea and vomiting. Decreased O2 saturation.  PORTABLE CHEST - 1 VIEW  Comparison: Chest radiograph performed 05/24/2012  Findings: The lungs are well-aerated.  Mild right basilar airspace opacification raises concern for pneumonia; aspiration could have such an appearance.  There is no evidence of pleural effusion or pneumothorax.  The cardiomediastinal silhouette is normal in size; calcification is noted within the aortic arch.  A left IJ dual lumen catheter is noted ending about the distal SVC.  No acute osseous abnormalities are seen.  IMPRESSION: New mild right basilar airspace opacification, concerning for pneumonia; aspiration could have such an appearance.   Original Report Authenticated By: Tonia Ghent, M.D.    Medications:    . sodium chloride 10 mL/hr at 05/30/12 1132      . darbepoetin (ARANESP) injection - DIALYSIS  200 mcg Intravenous Q Mon-HD  . feeding supplement (NEPRO CARB STEADY)  237 mL Oral BID BM  . feeding supplement  30 mL Oral BID WC  . ferric gluconate (FERRLECIT/NULECIT) IV  125 mg Intravenous Q M,W,F-HD  . insulin aspart  0-15 Units Subcutaneous Q4H  . levofloxacin  500 mg Oral Q48H  . levothyroxine   75 mcg Oral QAC breakfast  . multivitamin  1 tablet Oral Daily  . niacin  500 mg Oral Daily  . paricalcitol  1 mcg Intravenous 3 times weekly  . sodium chloride  250 mL Intravenous Once  . vancomycin  500 mg Oral Q6H   I  have reviewed scheduled and prn medications.  Physical Exam: General: Alert in bed , NAD , chronically ill Heart: rrr, no rub Lungs: CTA Bilat.  Abdomen: soft, nontender Extremities: Dialysis Access:  Trace bipedal edema, left ij perm cath /    Assessment/Plan:  1. S/p L distal femur fx w ORIF 12/22 by Dr. Carola Frost 2. Debility- turned down by CIR, accepted at SNF, for discharge in am (needs HD still tonight). Ready for d/c from renal standpoint. Discussed with Dr. Isidoro Donning 3. ESRD on hd mwf via IJ cath. Blood cx's negative, no evidence of tunneled HD cath infection. HD today 4. Cdif+- improved diarrhea; on po Vanc 5. HTN/volume- UF today on hd Was on lisinopril 20/hs, doxazosin 8/hs and amlodipine 5/d at home; all are on hold. BP still on low side. 6. VDRF- resolved 7. 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 No binders or sensipar listed on HD center med list P <3 9. Anemia of CKD- Aranesp increased to 200ug/week. Hb 7.2- stable, Jehovah's Witness will not take transfusion of blood products - on q HD IV Fe 10. Nutrition - poor - changed to high protein diet and have added nepro and prostat   Lenny Pastel, PA-C [facility] Beeper [phone] 06/01/2012,3:26 PM  LOS: 13 days   Patient seen and examined and agree with assessment and plan as above with additions as indicated.  Vinson Moselle  MD [facility] [phone] pgr    [phone] cell 06/01/2012, 5:41 PM

## 2012-06-02 DIAGNOSIS — R05 Cough: Secondary | ICD-10-CM

## 2012-06-02 LAB — GLUCOSE, CAPILLARY
Glucose-Capillary: 195 mg/dL — ABNORMAL HIGH (ref 70–99)
Glucose-Capillary: 186 mg/dL — ABNORMAL HIGH (ref 70–99)

## 2012-06-02 MED ORDER — PARICALCITOL 5 MCG/ML IV SOLN
INTRAVENOUS | Status: AC
  Filled 2012-06-02: qty 1

## 2012-06-02 MED ORDER — OXYCODONE HCL 5 MG PO TABS
ORAL_TABLET | ORAL | Status: AC
  Administered 2012-06-02: 5 mg via ORAL
  Filled 2012-06-02: qty 1

## 2012-06-02 MED ORDER — CHOLESTYRAMINE LIGHT 4 G PO PACK
4.0000 g | PACK | Freq: Every day | ORAL | Status: DC
  Administered 2012-06-02 – 2012-06-03 (×2): 4 g via ORAL
  Filled 2012-06-02 (×2): qty 1

## 2012-06-02 MED ORDER — METRONIDAZOLE IN NACL 5-0.79 MG/ML-% IV SOLN
500.0000 mg | Freq: Three times a day (TID) | INTRAVENOUS | Status: DC
  Administered 2012-06-02 – 2012-06-04 (×7): 500 mg via INTRAVENOUS
  Filled 2012-06-02 (×10): qty 100

## 2012-06-02 NOTE — Progress Notes (Signed)
Patient ID: [name]  female  WUJ:[id number]    DOB: 1945/07/31    DOA: 05/19/2012  PCP: Avon Gully, MD  Assessment/Plan:  L. Distal Femur Fracture, supracondylar  -s/p IM nailing on 12/22 per Dr Carola Frost, follow-up out-patient with Dr Carola Frost in 10 days after DC. PT/OT evaluations recommended SNF for rehab.   C.diff colitis ; still having diarrhea with flexi-seal  - CT abd and pelvis with diffuse colitis--no perforation or abscess.  - Continue oral vancomycin every 6 hours, plan 14 days for now, may need a longer course given patient has been started on antibiotics for Aspiration pneumonia.  - Cont flexi-seal, started on cholestyramine and IV flagyl until diarrhea improves  Aspiration pneumonia  Started on Zosyn on 06/01/2011. CXR on 06/01/2011 showed new mild right basilar airspace opacification concerning for pneumonia. Discontinued zosyn and placed on oral Levofloxacin for 7 doses. Repeat chest X-ray can be obtained outpatient in next 2 weeks for complete resolution.   Acute Resp failure/sepsis with metabolic encephalopathy: resolved  Intubated 12/26>12/27 per CCM. Resolved. Was found to be C.DIFF positive on 05/24/12 which was likely the source of septic shock, improved on abx. Continue oral vanc. Initial procalcitonin 17.02, lactic acid 1.1. Blood cultures has been negative so far.   ESRD  Dialysis per renal, patient is on Monday-Wed-Friday HD  Anemia  Secondary to #4  On aranesp, she is Jehovah's witness>> no blood transfusion  HTN  Outpt meds on hold for now as BPs improved but still soft, monitor resume when appropriate. BP normotensive.   Decondtioning  PT recommending SNF    DVT Prophylaxis:  Code Status:  Disposition: likely on Monday, hopefully diarrhea will improve, will ask renal to do early shift HD on Monday    Subjective: Still greenish watery diarrhea in flexi seal.   Objective: Weight change: 0 kg (0 oz)  Intake/Output Summary (Last 24 hours) at  06/02/12 1356 Last data filed at 06/02/12 0424  Gross per 24 hour  Intake    400 ml  Output   1076 ml  Net   -676 ml   Blood pressure 87/45, pulse 99, temperature 99.1 F (37.3 C), temperature source Oral, resp. rate 20, height 5\' 3"  (1.6 m), weight 77.2 kg (170 lb 3.1 oz), SpO2 98.00%.  Physical Exam: General: Alert and awake, oriented x3, not in any acute distress. HEENT: anicteric sclera, pupils reactive to light and accommodation, EOMI CVS: S1-S2 clear, no murmur rubs or gallops Chest: clear to auscultation bilaterally, no wheezing, rales or rhonchi Abdomen: soft nontender, nondistended, normal bowel sounds, no organomegaly Extremities: no cyanosis, clubbing or edema noted bilaterally GU: flexi-seal+  Lab Results: Basic Metabolic Panel:  Lab 06/01/12 9562 05/31/12 0630 05/29/12 0535 05/28/12 1654  NA 132* 141 -- --  K 5.9* 3.7 -- --  CL 93* 98 -- --  CO2 23 27 -- --  GLUCOSE 188* 208* -- --  BUN 45* 28* -- --  CREATININE 7.49* 5.32* -- --  CALCIUM 8.1* 8.1* -- --  MG -- -- -- 1.9  PHOS -- -- 2.6 --   Liver Function Tests:  Lab 05/29/12 0535 05/28/12 0500  AST -- --  ALT -- --  ALKPHOS -- --  BILITOT -- --  PROT -- --  ALBUMIN 2.1* 2.1*   No results found for this basename: LIPASE:2,AMYLASE:2 in the last 168 hours No results found for this basename: AMMONIA:2 in the last 168 hours CBC:  Lab 06/01/12 2020 05/31/12 0630  WBC 18.4* 18.9*  NEUTROABS -- --  HGB 8.2* 8.0*  HCT 27.8* 26.3*  MCV 85.3 83.8  PLT 212 196   Cardiac Enzymes: No results found for this basename: CKTOTAL:3,CKMB:3,CKMBINDEX:3,TROPONINI:3 in the last 168 hours BNP: No components found with this basename: POCBNP:2 CBG:  Lab 06/02/12 1241 06/02/12 0821 06/02/12 0421 06/01/12 1955 06/01/12 1736  GLUCAP 186* 195* 121* 197* 201*     Micro Results: Recent Results (from the past 240 hour(s))  MRSA PCR SCREENING     Status: Normal   Collection Time   05/24/12  2:50 PM      Component  Value Range Status Comment   MRSA by PCR NEGATIVE  NEGATIVE Final   CLOSTRIDIUM DIFFICILE BY PCR     Status: Abnormal   Collection Time   05/24/12  2:53 PM      Component Value Range Status Comment   C difficile by pcr POSITIVE (*) NEGATIVE Final     Studies/Results: Ct Abdomen Pelvis Wo Contrast  05/24/2012  *RADIOLOGY REPORT*  Clinical Data: Rectal bleeding, recent abnormal CT with sigmoid wall thickening, new sepsis  CT ABDOMEN AND PELVIS WITHOUT CONTRAST  Technique:  Multidetector CT imaging of the abdomen and pelvis was performed following the standard protocol without intravenous contrast. Small amount of GI contrast is present.  Sagittal and coronal MPR images reconstructed from axial data set.  Comparison: 05/17/2012  Findings: Mild atelectasis left lung base. Right femoral line, tip IVC. Nasogastric tube in stomach. Minimal pericardial effusion Slightly increased. Streak artifacts from the patient's arms traverse the upper abdomen. Low to intermediate attenuation lesion within the anterior 5.1 x 4.0 cm image 30.  Within limits of a nonenhanced exam, no focal abnormalities of the liver, spleen, pancreas, kidneys or adrenal glands otherwise identified. Atherosclerotic calcifications without aneurysm. Large and small bowel loops are underdistended making assessment of wall thickness suboptimal. However there appears to be diffuse wall thickening of the colon with adjacent pericolic infiltration and multiple sites question colitis. Minimal sigmoid diverticulosis incidentally noted.  Bladder partially distended, cystocele noted. Probable 10 mm uterine leiomyoma at fundus. Scattered normal-sized mesenteric nodes medial to right colon. No additional mass, adenopathy, free fluid, or free air. Bones appear diffusely demineralized.  IMPRESSION: Probable diffuse colitis; differential diagnosis includes infection, inflammatory bowel disease, and ischemia; when I called results to the patient's nurse, he  indicated a new positive laboratory test for Clostridium difficile, which would account for this finding. Minimal pericardial effusion, increased. Remainder of exam unchanged from prior recent CT.  Findings called to [facility] on 2100 on 05/24/2012 at 1653 hours.   Original Report Authenticated By: Ulyses Southward, M.D.    Ct Abdomen Pelvis Wo Contrast  05/17/2012  *RADIOLOGY REPORT*  Clinical Data: Rectal bleeding and lower abdominal pain.  CT ABDOMEN AND PELVIS WITHOUT CONTRAST  Technique:  Multidetector CT imaging of the abdomen and pelvis was performed following the standard protocol without intravenous contrast.  Comparison: CT abdomen pelvis 08/19/2009  Findings: At the lung base, heavy coronary artery atherosclerotic calcification is noted.  The heart is not completely imaged.  The visualized lungs are clear.  The noncontrast appearance of the liver, spleen, adrenal glands, and pancreas is within normal limits.  A 4.5 x 3.7 centimeter water density lesion in the mid pole of the left kidney statistically most likely a renal cyst, but not completely characterized on noncontrast CT.  There are multiple hyperdense rounded lesions associated with the left renal cortex, measuring in the range of 7- 8 mm, which may  be hyperdense cysts.  A few calcifications associated with both kidneys are favored to be vascular calcifications.  There is no hydronephrosis.  A hyperdense lesion associated the lower pole of the right kidney measures 10 mm.  There is extensive atherosclerotic calcification of the aortoiliac vasculature.  There is infrarenal abdominal aortic ectasia measuring up to 2.2 cm AP diameter.  Negative for aneurysm.  The appendix is identified, and courses posterior to the cecum. Proximally, the appendix measures 8-9 mm in diameter, mildly prominent.  The distal tip of the appendix measures 5 mm and contains a small locule of gas within its lumen.  The proximal aspect of the appendix does not contain gas.  No  periappendiceal inflammatory stranding is appreciated.  There is extensive diverticulosis of the sigmoid colon.  There is some sigmoid wall thickening (for example image number 58).  No discrete inflammatory stranding is seen in association with the diverticula.  There is a moderate amount of stool in the colon. There is no evidence of bowel obstruction.  The uterus is retroverted and contains a few coarse calcifications, suggesting the presence of uterine fibroids.  Urinary bladder is unremarkable.  There is degenerative disc disease at L5-S1.  Vertebral bodies are normal in height and alignment.  IMPRESSION:  1.  Early appendicitis cannot be excluded, but is not definite. The proximal appendix measures prominent in caliber, up to 8 to 9 mm, but it is noted that the proximal appendix measured approximately 8 mm on the prior CT of March 2011.  No periappendiceal inflammatory stranding is seen.  Suggest clinical correlation, and follow-up imaging as warranted. 2.  Diverticulosis of the sigmoid colon without definite CT evidence of acute diverticulitis.  In the region of the diverticula, there is suggestion of sigmoid colon wall thickening, which may be secondary to muscular hypertrophy related to diverticulosis.  Acute colitis or underlying neoplasm cannot be excluded. 3.  4.6 cm water density left renal mass.  This is likely renal cyst.  This could be confirmed with renal ultrasound is desired. 4.  Several small bilateral, left greater than right, high density renal lesions likely reflect complex renal cysts.  These cannot be completely characterized on noncontrast CT. 5.  Aortoiliac atherosclerosis with ectasia of the infrarenal abdominal aorta.   Original Report Authenticated By: Britta Mccreedy, M.D.    Dg Chest 1 View  05/19/2012  *RADIOLOGY REPORT*  Clinical Data: Larey Seat from bed  CHEST - 1 VIEW  Comparison: 02/27/2012  Findings: Left jugular dual-lumen central venous catheter tip projecting over cavoatrial  junction. Enlargement of cardiac silhouette with pulmonary vascular congestion. Mild diffuse increased interstitial markings in both lungs question minimal edema. No segmental consolidation, pleural effusion or pneumothorax. Bones demineralized.  IMPRESSION: Enlargement of cardiac silhouette with pulmonary vascular congestion. Question minimal pulmonary edema.   Original Report Authenticated By: Ulyses Southward, M.D.    Dg Femur Left  06/01/2012  *RADIOLOGY REPORT*  Clinical Data: Follow-up femur fracture with reduction  LEFT FEMUR - 2 VIEW  Comparison: 05/20/2012  Findings: Intramedullary nail extends from the distal femur to the proximal metaphysis.  There are proximal and distal securing screws.  Comminuted fracture at the distal diaphyseal metaphyseal junction region appears the same as on the previous study.  Overall alignment is near anatomic.  Position of the fracture fragments is unchanged.  IMPRESSION: No change since 05/20/2012.  Intramedullary nail placement for treatment of distal femur fracture.   Original Report Authenticated By: Paulina Fusi, M.D.    Dg Femur Left  05/20/2012  *RADIOLOGY REPORT*  Clinical Data: Post retrograde line and nail of the left femur  DG C-ARM 61-120 MIN, LEFT FEMUR - 2 VIEW  Comparison:  Left femur radiographs - 05/19/2012  Findings:  Six spot intraoperative fluoroscopic images of the left femur are provided for review.  Images demonstrate retrograde intramedullary nail fixation of previously noted distal diaphyseal/metaphyseal comminuted femur fracture. Alignment is much improved.  The distal end of the intramedullary rod is transfixed with three cancellous screws.  The proximal end of the femoral rods transfixed with two cancellous screws.  Skin staples overlying the proximal thigh. No radiopaque foreign body.  IMPRESSION: Post intramedullary rod fixation of comminuted distal diaphyseal/metaphyseal femoral fracture.   Original Report Authenticated By: Tacey Ruiz, MD     Dg Femur Left  05/19/2012  *RADIOLOGY REPORT*  Clinical Data: Fall  LEFT FEMUR - 2 VIEW  Comparison: None.  Findings: Three views of the left femur submitted.  There is displaced comminuted fracture in distal left femoral shaft.  IMPRESSION: Displaced comminuted fracture distal left femoral shaft.   Original Report Authenticated By: Natasha Mead, M.D.    Dg Tibia/fibula Left  05/19/2012  *RADIOLOGY REPORT*  Clinical Data: Fall  LEFT TIBIA AND FIBULA - 2 VIEW  Comparison: None.  Findings: Two views of the left tibia-fibula submitted.  No acute fracture or subluxation.  Atherosclerotic vascular calcifications are noted.  IMPRESSION: No acute fracture or subluxation.   Original Report Authenticated By: Natasha Mead, M.D.    Ir Fluoro Guide Cv Line Right  05/17/2012  *RADIOLOGY REPORT*  Clinical data:  End-stage renal disease.  Poor function of tunneled left IJ hemodialysis catheter.  CATHETER INJECTION UNDER FLUOROSCOPY EXCHANGE OF TUNNELED HEMODIALYSIS CENTRAL VENOUS CATHETER  Technique and findings:  Initially, water-soluble contrast was injected through both lumens of the previously placed tunneled left IJ hemodialysis catheter sequentially.  This demonstrated position of the tip of the catheter in the IVC near hepatic vein inflows.  There is occlusion of the distal aspect of the blue venous return lumen.  The catheter and surrounding skin were then prepped and draped in usual sterile fashion. Maximal barrier sterile technique was utilized including caps, mask, sterile gowns, sterile gloves, sterile drape, hand hygiene and skin antiseptic.  As antibiotic prophylaxis, cefazolin 1 gram was ordered pre- procedure and administered intravenously within one hour of incision.  Intravenous Fentanyl and Versed were administered as conscious sedation during continuous cardiorespiratory monitoring by the radiology RN, with a total moderate sedation time of 12 minutes.  The previously placed catheter was dissected free  of the underlying subcutaneous tissues and removed partially over an angled stiff glide wire.  Contrast injection demonstrated no significant fibrin sheath or SVC thrombus/stenosis.  The catheter was then exchanged over two parallel stiff glide wires for a new 19 cm Equistream hemodialysis catheter, placed with the tips in the mid right atrium.  Spot chest radiograph confirms appropriate catheter position.  Both lumens aspirated and flushed easily.  Both lumens were flushed per protocol.  The catheter was secured externally with O-Prolene sutures and a sterile dressing applied. The patient tolerated the procedure well.  No immediate complication.  IMPRESSION: 1.  Low position of the tips of the previously placed hemodialysis catheter, in the intrahepatic segment of the IVC. 2.  Technically successful exchange and repositioning of tunneled left IJ hemodialysis catheter, okay for routine use.   Original Report Authenticated By: D. Andria Rhein, MD    Ir Cv Line Injection  05/17/2012  *  RADIOLOGY REPORT*  Clinical data:  End-stage renal disease.  Poor function of tunneled left IJ hemodialysis catheter.  CATHETER INJECTION UNDER FLUOROSCOPY EXCHANGE OF TUNNELED HEMODIALYSIS CENTRAL VENOUS CATHETER  Technique and findings:  Initially, water-soluble contrast was injected through both lumens of the previously placed tunneled left IJ hemodialysis catheter sequentially.  This demonstrated position of the tip of the catheter in the IVC near hepatic vein inflows.  There is occlusion of the distal aspect of the blue venous return lumen.  The catheter and surrounding skin were then prepped and draped in usual sterile fashion. Maximal barrier sterile technique was utilized including caps, mask, sterile gowns, sterile gloves, sterile drape, hand hygiene and skin antiseptic.  As antibiotic prophylaxis, cefazolin 1 gram was ordered pre- procedure and administered intravenously within one hour of incision.  Intravenous Fentanyl and  Versed were administered as conscious sedation during continuous cardiorespiratory monitoring by the radiology RN, with a total moderate sedation time of 12 minutes.  The previously placed catheter was dissected free of the underlying subcutaneous tissues and removed partially over an angled stiff glide wire.  Contrast injection demonstrated no significant fibrin sheath or SVC thrombus/stenosis.  The catheter was then exchanged over two parallel stiff glide wires for a new 19 cm Equistream hemodialysis catheter, placed with the tips in the mid right atrium.  Spot chest radiograph confirms appropriate catheter position.  Both lumens aspirated and flushed easily.  Both lumens were flushed per protocol.  The catheter was secured externally with O-Prolene sutures and a sterile dressing applied. The patient tolerated the procedure well.  No immediate complication.  IMPRESSION: 1.  Low position of the tips of the previously placed hemodialysis catheter, in the intrahepatic segment of the IVC. 2.  Technically successful exchange and repositioning of tunneled left IJ hemodialysis catheter, okay for routine use.   Original Report Authenticated By: D. Andria Rhein, MD    Dg Chest Port 1 View  05/31/2012  *RADIOLOGY REPORT*  Clinical Data: Possible aspiration and cough, nausea and vomiting. Decreased O2 saturation.  PORTABLE CHEST - 1 VIEW  Comparison: Chest radiograph performed 05/24/2012  Findings: The lungs are well-aerated.  Mild right basilar airspace opacification raises concern for pneumonia; aspiration could have such an appearance.  There is no evidence of pleural effusion or pneumothorax.  The cardiomediastinal silhouette is normal in size; calcification is noted within the aortic arch.  A left IJ dual lumen catheter is noted ending about the distal SVC.  No acute osseous abnormalities are seen.  IMPRESSION: New mild right basilar airspace opacification, concerning for pneumonia; aspiration could have such an  appearance.   Original Report Authenticated By: Tonia Ghent, M.D.    Dg Chest Port 1 View  05/24/2012  *RADIOLOGY REPORT*  Clinical Data: Evaluate nasogastric tube and endotracheal tube placements  PORTABLE CHEST - 1 VIEW  Comparison: Portable exam 1303 hours compared to 05/24/2012  Findings: Tip of endotracheal tube 1.5 cm above carina. Left jugular dual-lumen large-bore central venous catheter with tip projecting over the cavoatrial junction. Nasogastric tube extends into stomach. Upper normal-size of cardiac silhouette. Atherosclerotic calcification aorta. Mediastinal contours and pulmonary vascularity normal. No definite infiltrate, pleural effusion or pneumothorax. No acute osseous findings.  IMPRESSION: No acute abnormalities. Line and tube positions as above.   Original Report Authenticated By: Ulyses Southward, M.D.    Dg Chest Port 1 View  05/24/2012  **ADDENDUM** CREATED: 05/24/2012 10:14:16  Comparison exams from 05/19/2012 and 02/10/2011 are available.  Hilar prominence unchanged.  Pulmonary vascular congestion/mild  pulmonary edema slightly more notable than on the prior exams.  **END ADDENDUM** SIGNED BY: Almedia Balls. Constance Goltz, M.D.   05/24/2012  *RADIOLOGY REPORT*  Clinical Data: Increase shortness of breath.  PORTABLE CHEST - 1 VIEW  Comparison: Currently, there are PACS problems and no comparison exams are available.  Findings: Left central line tip in the region of the distal superior vena cava/cavoatrial junction.  No gross pneumothorax.  Mild pulmonary edema.  Hilar prominence may be vascular in origin.  Stability can be confirmed on follow-up.  Heart size top normal.  Calcified slightly tortuous aorta.  No segmental consolidation.  IMPRESSION: No comparisons presently available.  Mild pulmonary edema.  Please see above.   Original Report Authenticated By: Lacy Duverney, M.D.    Dg Chest [facility]  05/24/2012  Garnetta Buddy, MD     05/24/2012  9:17 AM I have seen and examined this patient and  agree with the plan of  care. Low blood pressure and short of breath. patient now being  ultrafiltered. Check CXR doubt that more fluid can be removed.  WEBB,MARTIN W 05/24/2012, 9:16 AM     Dg Femur Left Port  05/20/2012  *RADIOLOGY REPORT*  Clinical Data: Status post ORIF.  PORTABLE LEFT FEMUR - 2 VIEW  Comparison: Intraoperative fluoro spot images 05/20/2012.  Findings: The patient is status post placement of an intramedullary rod in the left femur.  Three distal and two proximal interlocking screws are in place.  There is slight anterior displacement of approximately one cortex width of the distal comminuted fracture. A moderate sized joint effusion is present.  Atherosclerotic calcifications are present within the femoral artery.  IMPRESSION:  1.  Near anatomic reduction of the comminuted distal femur fracture status post ORIF. 2.  No radiographic evidence for complication. 3.  Atherosclerosis.   Original Report Authenticated By: Marin Roberts, M.D.    Dg Knee Complete 4 Views Left  05/19/2012  *RADIOLOGY REPORT*  Clinical Data: Fall  LEFT KNEE - COMPLETE 4+ VIEW  Comparison: Left femur same day  Findings: Two views of the left knee submitted.  No knee fracture or subluxation.  Again noted displaced comminuted fracture distal left femoral shaft.  IMPRESSION: No knee fracture or subluxation.  Again noted displaced comminuted fracture distal left femoral shaft.   Original Report Authenticated By: Natasha Mead, M.D.    Dg C-arm 61-120 Min  05/20/2012  *RADIOLOGY REPORT*  Clinical Data: Post retrograde line and nail of the left femur  DG C-ARM 61-120 MIN, LEFT FEMUR - 2 VIEW  Comparison:  Left femur radiographs - 05/19/2012  Findings:  Six spot intraoperative fluoroscopic images of the left femur are provided for review.  Images demonstrate retrograde intramedullary nail fixation of previously noted distal diaphyseal/metaphyseal comminuted femur fracture. Alignment is much improved.  The distal end  of the intramedullary rod is transfixed with three cancellous screws.  The proximal end of the femoral rods transfixed with two cancellous screws.  Skin staples overlying the proximal thigh. No radiopaque foreign body.  IMPRESSION: Post intramedullary rod fixation of comminuted distal diaphyseal/metaphyseal femoral fracture.   Original Report Authenticated By: Tacey Ruiz, MD     Medications: Scheduled Meds:   . cholestyramine light  4 g Oral Daily  . darbepoetin (ARANESP) injection - DIALYSIS  200 mcg Intravenous Q Mon-HD  . feeding supplement (NEPRO CARB STEADY)  237 mL Oral BID BM  . feeding supplement  30 mL Oral BID WC  . ferric gluconate (FERRLECIT/NULECIT) IV  125 mg Intravenous Q M,W,F-HD  . insulin aspart  0-15 Units Subcutaneous Q4H  . levofloxacin  500 mg Oral Q48H  . levothyroxine  75 mcg Oral QAC breakfast  . metronidazole  500 mg Intravenous Q8H  . multivitamin  1 tablet Oral Daily  . niacin  500 mg Oral Daily  . paricalcitol  1 mcg Intravenous 3 times weekly  . sodium chloride  250 mL Intravenous Once  . vancomycin  500 mg Oral Q6H      LOS: 14 days   [name],RIPUDEEP M.D. [facility] 06/02/2012, 1:56 PM Pager: [phone]  If 7PM-7AM, please contact night-coverage [email or web address] Password [email or web address]

## 2012-06-02 NOTE — Progress Notes (Signed)
Subjective:  Dc held sec. To late hd last pm. No co today Objective Vital signs in last 24 hours: Filed Vitals:   06/02/12 0347 06/02/12 0423 06/02/12 0945 06/02/12 0950  BP: 114/57 106/57 90/38 87/45   Pulse: 98 99 99   Temp: 98.3 F (36.8 C) 99 F (37.2 C) 99.1 F (37.3 C)   TempSrc: Oral Oral Oral   Resp: 22 22 20    Height:      Weight: 77.2 kg (170 lb 3.1 oz)     SpO2: 100% 100% 98%    Weight change: 0 kg (0 oz)  Intake/Output Summary (Last 24 hours) at 06/02/12 1137 Last data filed at 06/02/12 0424  Gross per 24 hour  Intake    400 ml  Output   1076 ml  Net   -676 ml   Labs: Basic Metabolic Panel:  Lab 06/01/12 1610 05/31/12 0630 05/30/12 0615 05/29/12 0535 05/28/12 0500 05/27/12 0430  NA 132* 141 138 -- -- --  K 5.9* 3.7 3.7 -- -- --  CL 93* 98 103 -- -- --  CO2 23 27 22  -- -- --  GLUCOSE 188* 208* 142* -- -- --  BUN 45* 28* 13 -- -- --  CREATININE 7.49* 5.32* 2.95* -- -- --  CALCIUM 8.1* 8.1* 7.6* -- -- --  ALB -- -- -- -- -- --  PHOS -- -- -- 2.6 3.5 3.3   Liver Function Tests:  Lab 05/29/12 0535 05/28/12 0500 05/27/12 0430  AST -- -- --  ALT -- -- --  ALKPHOS -- -- --  BILITOT -- -- --  PROT -- -- --  ALBUMIN 2.1* 2.1* 1.9*   No results found for this basename: LIPASE:3,AMYLASE:3 in the last 168 hours No results found for this basename: AMMONIA:3 in the last 168 hours CBC:  Lab 06/01/12 2020 05/31/12 0630 05/30/12 0615 05/29/12 0535 05/28/12 1654  WBC 18.4* 18.9* 13.0* -- --  NEUTROABS -- -- -- -- --  HGB 8.2* 8.0* 7.2* -- --  HCT 27.8* 26.3* 23.4* -- --  MCV 85.3 83.8 83.0 82.3 81.2  PLT 212 196 152 -- --   Cardiac Enzymes: No results found for this basename: CKTOTAL:5,CKMB:5,CKMBINDEX:5,TROPONINI:5 in the last 168 hours CBG:  Lab 06/02/12 0821 06/02/12 0421 06/01/12 1955 06/01/12 1736 06/01/12 1159  GLUCAP 195* 121* 197* 201* 188*    Iron Studies: No results found for this basename: IRON,TIBC,TRANSFERRIN,FERRITIN in the last 72  hours Studies/Results: Dg Femur Left  06/01/2012  *RADIOLOGY REPORT*  Clinical Data: Follow-up femur fracture with reduction  LEFT FEMUR - 2 VIEW  Comparison: 05/20/2012  Findings: Intramedullary nail extends from the distal femur to the proximal metaphysis.  There are proximal and distal securing screws.  Comminuted fracture at the distal diaphyseal metaphyseal junction region appears the same as on the previous study.  Overall alignment is near anatomic.  Position of the fracture fragments is unchanged.  IMPRESSION: No change since 05/20/2012.  Intramedullary nail placement for treatment of distal femur fracture.   Original Report Authenticated By: Paulina Fusi, M.D.    Medications:    . sodium chloride 10 mL/hr at 05/30/12 1132      . cholestyramine light  4 g Oral Daily  . darbepoetin (ARANESP) injection - DIALYSIS  200 mcg Intravenous Q Mon-HD  . feeding supplement (NEPRO CARB STEADY)  237 mL Oral BID BM  . feeding supplement  30 mL Oral BID WC  . ferric gluconate (FERRLECIT/NULECIT) IV  125 mg Intravenous Q M,W,F-HD  .  insulin aspart  0-15 Units Subcutaneous Q4H  . levofloxacin  500 mg Oral Q48H  . levothyroxine  75 mcg Oral QAC breakfast  . metronidazole  500 mg Intravenous Q8H  . multivitamin  1 tablet Oral Daily  . niacin  500 mg Oral Daily  . paricalcitol  1 mcg Intravenous 3 times weekly  . sodium chloride  250 mL Intravenous Once  . vancomycin  500 mg Oral Q6H   I  have reviewed scheduled and prn medications.  Physical Exam: General: Alert in bed , NAD , chronically ill  Heart: rrr, no rub  Lungs: CTA Bilat.  Abdomen: soft, nontender  Extremities: Dialysis Access: Trace bipedal edema left > right with Dry darkened skin blister around left ankle cw ischemic changes/ and left toes dry dark ischemic changes/ left ij perm cath /   Dialysis Orders: Center: [facility] on MWF. EDW 76.5 kg HD Bath 2K/2.25Ca Time 4 hrs Heparin 7600-U bolus, 2000-U mid HD. Access Left IJ catheter  BFR 400 DFR 800 Zemplar 1 mcg IV/HD Epogen 0 Units IV/HD Venofer 100 mg per HD  Assessment/Plan:  1. S/p L distal femur fx w ORIF 12/22 by Dr. Carola Frost for dc to snh  Today 2. Debility- turned down by CIR, for SNF placement 3. ESRD on hd mwf via IJ cath. Blood cx's negative, no evidence of tunneled HD cath infection. HD last pm 4. Cdif+- still diarrhea with rectal tube in place; per primary 5. HTN/volume- UF today on hd Was on lisinopril 20/hs, doxazosin 8/hs and amlodipine 5/d at home; all are on hold. BP still on low side. 6. VDRF- resolved 7. AMS- due to sepsis, baseline today/ resolved / on Levaquin qod  8. 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 No binders or sensipar listed on HD center med list P <3 9. Anemia of CKD- Aranesp increased to 200ug/week. Hb 8.2- stable, Jehovah's Witness will not take transfusion of blood products - on q HD IV Fe since 05/21/12 will change to weekly wed hd 10. Nutrition - alb 2.1 protein supplement at kid center-/ on bid nepro also  Sheila Pastel, PA-C [facility] Beeper [phone] 06/02/2012,11:37 AM  LOS: 14 days   Patient seen and examined and agree with assessment and plan as above with additions as indicated.  Vinson Moselle  MD [employer] [phone] pgr    [phone] cell 06/02/2012, 3:26 PM

## 2012-06-03 DIAGNOSIS — R609 Edema, unspecified: Secondary | ICD-10-CM

## 2012-06-03 LAB — GLUCOSE, CAPILLARY
Glucose-Capillary: 186 mg/dL — ABNORMAL HIGH (ref 70–99)
Glucose-Capillary: 119 mg/dL — ABNORMAL HIGH (ref 70–99)
Glucose-Capillary: 172 mg/dL — ABNORMAL HIGH (ref 70–99)

## 2012-06-03 MED ORDER — LIDOCAINE-PRILOCAINE 2.5-2.5 % EX CREA: 1.0000 "application " | TOPICAL_CREAM | CUTANEOUS | Status: DC | PRN

## 2012-06-03 MED ORDER — NEPRO/CARBSTEADY PO LIQD: 237.0000 mL | ORAL | Status: DC | PRN

## 2012-06-03 MED ORDER — LIDOCAINE HCL (PF) 1 % IJ SOLN: 5.0000 mL | INTRAMUSCULAR | Status: DC | PRN

## 2012-06-03 MED ORDER — HEPARIN SODIUM (PORCINE) 1000 UNIT/ML DIALYSIS: 20.0000 [IU]/kg | INTRAMUSCULAR | Status: DC | PRN

## 2012-06-03 MED ORDER — ALTEPLASE 2 MG IJ SOLR
2.0000 mg | Freq: Once | INTRAMUSCULAR | Status: AC | PRN
  Filled 2012-06-03: qty 2

## 2012-06-03 MED ORDER — CHOLESTYRAMINE LIGHT 4 G PO PACK
4.0000 g | PACK | Freq: Every day | ORAL | Status: DC
  Administered 2012-06-04: 4 g via ORAL
  Filled 2012-06-03: qty 1

## 2012-06-03 MED ORDER — SODIUM CHLORIDE 0.9 % IV SOLN: 100.0000 mL | INTRAVENOUS | Status: DC | PRN

## 2012-06-03 MED ORDER — ALTEPLASE 2 MG IJ SOLR: 2.0000 mg | Freq: Once | INTRAMUSCULAR | Status: DC | PRN

## 2012-06-03 MED ORDER — PENTAFLUOROPROP-TETRAFLUOROETH EX AERO: 1.0000 "application " | INHALATION_SPRAY | CUTANEOUS | Status: DC | PRN

## 2012-06-03 MED ORDER — HEPARIN SODIUM (PORCINE) 1000 UNIT/ML DIALYSIS
1000.0000 [IU] | INTRAMUSCULAR | Status: DC | PRN
  Filled 2012-06-03: qty 1

## 2012-06-03 MED ORDER — HEPARIN SODIUM (PORCINE) 1000 UNIT/ML DIALYSIS: 1000.0000 [IU] | INTRAMUSCULAR | Status: DC | PRN

## 2012-06-03 NOTE — Progress Notes (Signed)
ANTIBIOTIC CONSULT NOTE - INITIAL  Pharmacy Consult for Levaquin Indication: aspiration pneumonia  Allergies  Allergen Reactions  . Contrast Media (Iodinated Diagnostic Agents) Itching    Patient Measurements: Height: 5\' 3"  (160 cm) Weight: 171 lb 15.3 oz (78 kg) IBW/kg (Calculated) : 52.4    Vital Signs: Temp: 98.9 F (37.2 C) (01/05 1007) Temp src: Oral (01/05 1007) BP: 108/48 mmHg (01/05 1007) Pulse Rate: 93  (01/05 1007) Intake/Output from previous day: 01/04 0701 - 01/05 0700 In: 620 [P.O.:240; I.V.:280; IV Piggyback:100] Out: 2 [Stool:2] Intake/Output from this shift: Total I/O In: -  Out: 1 [Stool:1]  Labs:  [facility] 06/01/12 2020  WBC 18.4*  HGB 8.2*  PLT 212  LABCREA --  CREATININE 7.49*   Estimated Creatinine Clearance: 7.3 ml/min (by C-G formula based on Cr of 7.49). No results found for this basename: VANCOTROUGH:2,VANCOPEAK:2,VANCORANDOM:2,GENTTROUGH:2,GENTPEAK:2,GENTRANDOM:2,TOBRATROUGH:2,TOBRAPEAK:2,TOBRARND:2,AMIKACINPEAK:2,AMIKACINTROU:2,AMIKACIN:2, in the last 72 hours   Microbiology: Recent Results (from the past 720 hour(s))  SURGICAL PCR SCREEN     Status: Normal   Collection Time   05/20/12  1:45 AM      Component Value Range Status Comment   MRSA, PCR NEGATIVE  NEGATIVE Final    Staphylococcus aureus NEGATIVE  NEGATIVE Final   CULTURE, BLOOD (ROUTINE X 2)     Status: Normal   Collection Time   05/22/12  9:34 AM      Component Value Range Status Comment   Specimen Description BLOOD HAND RIGHT   Final    Special Requests BOTTLES DRAWN AEROBIC AND ANAEROBIC 10CC EACH   Final    Culture  Setup Time 05/22/2012 18:15   Final    Culture NO GROWTH 5 DAYS   Final    Report Status 05/28/2012 FINAL   Final   CULTURE, BLOOD (ROUTINE X 2)     Status: Normal   Collection Time   05/22/12  9:42 AM      Component Value Range Status Comment   Specimen Description BLOOD HAND RIGHT   Final    Special Requests BOTTLES DRAWN AEROBIC AND ANAEROBIC 10CC  EACH   Final    Culture  Setup Time 05/22/2012 18:15   Final    Culture NO GROWTH 5 DAYS   Final    Report Status 05/28/2012 FINAL   Final   MRSA PCR SCREENING     Status: Normal   Collection Time   05/24/12  2:50 PM      Component Value Range Status Comment   MRSA by PCR NEGATIVE  NEGATIVE Final   CLOSTRIDIUM DIFFICILE BY PCR     Status: Abnormal   Collection Time   05/24/12  2:53 PM      Component Value Range Status Comment   C difficile by pcr POSITIVE (*) NEGATIVE Final     Medical History: Past Medical History  Diagnosis Date  . ESRD on hemodialysis     MWF at [facility] HD. Started HD November 26, 2009. 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  Marland Kitchen COPD (chronic obstructive pulmonary disease)   . Diabetes mellitus   . Hypertension   . Thyroid disease   . Hyperlipidemia   . Bronchitis   . Leg pain   . 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 Peripheral vascular disease   . Hypothyroidism   . Arthritis     knee    Assessment: Started on Zosyn on 06/01/2011. CXR on 06/01/2011 showed new mild right basilar airspace opacification  concerning for pneumonia. Discontinued zosyn and placed on oral Levofloxacin for 7 doses. Currently has severe C.diff on PO Vanc, IV metro, and cholestyramine.    Plan: - Levaquin 500mg  q48h - Would limit length of therapy as much as possible given concurrent C.diff (7 days instead of 7 doses?)   Vania Rea. Darin Engels.D. Clinical Pharmacist Pager [phone] Phone [phone] 06/03/2012 2:30 PM

## 2012-06-03 NOTE — Progress Notes (Signed)
[name] Progress Note  Subjective:   Per RN, flexiseal came out. One stool since yesterday - not watery.    Objective Filed Vitals:   06/02/12 1843 06/02/12 1954 06/02/12 2027 06/03/12 0435  BP:   118/46 109/41  Pulse:   97 94  Temp:   98.7 F (37.1 C) 97.9 F (36.6 C)  TempSrc:   Oral Oral  Resp:   18 18  Height:      Weight:  78 kg (171 lb 15.3 oz)    SpO2: 94%  100% 99%   Physical Exam  Tmax 99.8 General: uncomfortable - due to sitting. Heart: RRR Lungs: no wheezes or rales Abdomen:sfot Extremities: no LE edema Dialysis Access: left I-J  Dialysis Orders: Center: [facility] on MWF. EDW 76.5 kg HD Bath 2K/2.25Ca Time 4 hrs Heparin 7600-U bolus, 2000-U mid HD. Access Left IJ catheter BFR 400 DFR 800 Zemplar 1 mcg IV/HD Epogen 0 Units IV/HD Venofer 100 mg per HD   Assessment/Plan:  1. S/p L distal femur fx w ORIF 12/22 by Dr. Carola Frost - plan d/c to SNF when GI issues stable 2. Debility- turned down by CIR, for SNF placement 3. ESRD on hd mwf via IJ cath. Blood cx's negative, no evidence of tunneled HD cath infection. HD orders written for first round Monday if not d/c today. K 5.9 on Friday.  4. Cdif+- frequency improving with cholestyramine and po Vanc 5. HTN/volume- Was on lisinopril 20/hs, doxazosin 8/hs and amlodipine 5/d at home; all are on hold. BP still on low side. Above outpt EDW but unable to get standing weight. 6. VDRF- resolved 7. 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 No binders or sensipar listed on HD center med list P <3 9. Anemia of CKD- Aranesp increased to 200ug/week. Hb 8.2- stable, Jehovah's Witness will not take transfusion of blood products - on q HD IV Fe since 05/21/12 will change to weekly wed hd; check CBC pre HD Monday 10. Nutrition - alb 2.1 protein supplement at kid center-/ on bid nepro also  Sheffield Slider, PA-C [name] Beeper [phone] 06/03/2012,9:44 AM  LOS: 15  days   Patient seen and examined and agree with assessment and plan as above.  Sheila Moselle  MD [name] [phone] pgr    [phone] cell 06/03/2012, 8:10 PM   Additional Objective Labs: Basic Metabolic Panel:  Lab 06/01/12 2130 05/31/12 0630 05/30/12 0615 05/29/12 0535 05/28/12 0500  NA 132* 141 138 -- --  K 5.9* 3.7 3.7 -- --  CL 93* 98 103 -- --  CO2 23 27 22  -- --  GLUCOSE 188* 208* 142* -- --  BUN 45* 28* 13 -- --  CREATININE 7.49* 5.32* 2.95* -- --  CALCIUM 8.1* 8.1* 7.6* -- --  ALB -- -- -- -- --  PHOS -- -- -- 2.6 3.5   Liver Function Tests:  Lab 05/29/12 0535 05/28/12 0500  AST -- --  ALT -- --  ALKPHOS -- --  BILITOT -- --  PROT -- --  ALBUMIN 2.1* 2.1*  CBC:  Lab 06/01/12 2020 05/31/12 0630 05/30/12 0615 05/29/12 0535 05/28/12 1654  WBC 18.4* 18.9* 13.0* -- --  NEUTROABS -- -- -- -- --  HGB 8.2* 8.0* 7.2* -- --  HCT 27.8* 26.3* 23.4* -- --  MCV 85.3 83.8 83.0 82.3 81.2  PLT 212 196 152 -- --  CBG:  Lab 06/03/12 0758 06/03/12  4540 06/03/12 0012 06/02/12 2021 06/02/12 1609  GLUCAP 186* 169* 193* 197* 190*  Studies/Results: Dg Femur Left  06/01/2012  *RADIOLOGY REPORT*  Clinical Data: Follow-up femur fracture with reduction  LEFT FEMUR - 2 VIEW  Comparison: 05/20/2012  Findings: Intramedullary nail extends from the distal femur to the proximal metaphysis.  There are proximal and distal securing screws.  Comminuted fracture at the distal diaphyseal metaphyseal junction region appears the same as on the previous study.  Overall alignment is near anatomic.  Position of the fracture fragments is unchanged.  IMPRESSION: No change since 05/20/2012.  Intramedullary nail placement for treatment of distal femur fracture.   Original Report Authenticated By: Paulina Fusi, M.D.    Medications:    . sodium chloride 10 mL/hr at 05/30/12 1132      . cholestyramine light  4 g Oral Daily  . darbepoetin (ARANESP) injection - DIALYSIS  200 mcg Intravenous  Q Mon-HD  . feeding supplement (NEPRO CARB STEADY)  237 mL Oral BID BM  . feeding supplement  30 mL Oral BID WC  . ferric gluconate (FERRLECIT/NULECIT) IV  125 mg Intravenous Q M,W,F-HD  . insulin aspart  0-15 Units Subcutaneous Q4H  . levofloxacin  500 mg Oral Q48H  . levothyroxine  75 mcg Oral QAC breakfast  . metronidazole  500 mg Intravenous Q8H  . multivitamin  1 tablet Oral Daily  . niacin  500 mg Oral Daily  . paricalcitol  1 mcg Intravenous 3 times weekly  . sodium chloride  250 mL Intravenous Once  . vancomycin  500 mg Oral Q6H

## 2012-06-03 NOTE — Progress Notes (Signed)
Patient ID: [name]  female  ZOX:[id number]    DOB: 1946/03/04    DOA: 05/19/2012  PCP: Avon Gully, MD  Assessment/Plan:  L. Distal Femur Fracture, supracondylar  -s/p IM nailing on 12/22 per Dr Carola Frost, follow-up out-patient with Dr Carola Frost in 10 days after DC. PT/OT evaluations recommended SNF for rehab.   C.diff colitis  flexi-seal came out today - CT abd and pelvis with diffuse colitis--no perforation or abscess.  - Continue oral vancomycin every 6 hours, plan 14 days for now, may need a longer course given patient has been started on antibiotics for Aspiration pneumonia.  - started on cholestyramine and IV flagyl until diarrhea improves  Aspiration pneumonia  Started on Zosyn on 06/01/2011. CXR on 06/01/2011 showed new mild right basilar airspace opacification concerning for pneumonia. Discontinued zosyn and placed on oral Levofloxacin for 7 doses. Repeat chest X-ray can be obtained outpatient in next 2 weeks for complete resolution.   Acute Resp failure/sepsis with metabolic encephalopathy: resolved  Intubated 12/26>12/27 per CCM. Resolved. Was found to be C.DIFF positive on 05/24/12 which was likely the source of septic shock, improved on abx. Continue oral vanc. Initial procalcitonin 17.02, lactic acid 1.1. Blood cultures has been negative so far.   ESRD  Dialysis per renal, patient is on Monday-Wed-Friday HD  Anemia  Secondary to #4  On aranesp, she is Jehovah's witness>> no blood transfusion  HTN  Outpt meds on hold for now as BPs improved but still soft, monitor resume when appropriate. BP normotensive.   Decondtioning  PT recommending SNF    DVT Prophylaxis:  Code Status:  Disposition: likely tomorrow AM after dialysis    Subjective: flexi seal off, stools bulking up  Objective: Weight change: 0.9 kg (1 lb 15.7 oz)  Intake/Output Summary (Last 24 hours) at 06/03/12 1303 Last data filed at 06/03/12 1027  Gross per 24 hour  Intake    620 ml  Output      3  ml  Net    617 ml   Blood pressure 108/48, pulse 93, temperature 98.9 F (37.2 C), temperature source Oral, resp. rate 18, height 5\' 3"  (1.6 m), weight 78 kg (171 lb 15.3 oz), SpO2 95.00%.  Physical Exam: General: Alert and awake, oriented x3, not in any acute distress. HEENT: anicteric sclera, pupils reactive to light and accommodation, EOMI CVS: S1-S2 clear, no murmur rubs or gallops Chest: clear to auscultation bilaterally, no wheezing, rales or rhonchi Abdomen: soft nontender, nondistended, normal bowel sounds, no organomegaly Extremities: no cyanosis, clubbing or edema noted bilaterally. Blister on right lower leg   Lab Results: Basic Metabolic Panel:  Lab 06/01/12 8119 05/31/12 0630 05/29/12 0535 05/28/12 1654  NA 132* 141 -- --  K 5.9* 3.7 -- --  CL 93* 98 -- --  CO2 23 27 -- --  GLUCOSE 188* 208* -- --  BUN 45* 28* -- --  CREATININE 7.49* 5.32* -- --  CALCIUM 8.1* 8.1* -- --  MG -- -- -- 1.9  PHOS -- -- 2.6 --   Liver Function Tests:  Lab 05/29/12 0535 05/28/12 0500  AST -- --  ALT -- --  ALKPHOS -- --  BILITOT -- --  PROT -- --  ALBUMIN 2.1* 2.1*  CBC:  Lab 06/01/12 2020 05/31/12 0630  WBC 18.4* 18.9*  NEUTROABS -- --  HGB 8.2* 8.0*  HCT 27.8* 26.3*  MCV 85.3 83.8  PLT 212 196   Cardiac Enzymes: No results found for this basename: CKTOTAL:3,CKMB:3,CKMBINDEX:3,TROPONINI:3 in the last  168 hours BNP: No components found with this basename: POCBNP:2 CBG:  Lab 06/03/12 0758 06/03/12 0419 06/03/12 0012 06/02/12 2021 06/02/12 1609  GLUCAP 186* 169* 193* 197* 190*     Micro Results: Recent Results (from the past 240 hour(s))  MRSA PCR SCREENING     Status: Normal   Collection Time   05/24/12  2:50 PM      Component Value Range Status Comment   MRSA by PCR NEGATIVE  NEGATIVE Final   CLOSTRIDIUM DIFFICILE BY PCR     Status: Abnormal   Collection Time   05/24/12  2:53 PM      Component Value Range Status Comment   C difficile by pcr POSITIVE (*)  NEGATIVE Final     Studies/Results: Ct Abdomen Pelvis Wo Contrast  05/24/2012  *RADIOLOGY REPORT*  Clinical Data: Rectal bleeding, recent abnormal CT with sigmoid wall thickening, new sepsis  CT ABDOMEN AND PELVIS WITHOUT CONTRAST  Technique:  Multidetector CT imaging of the abdomen and pelvis was performed following the standard protocol without intravenous contrast. Small amount of GI contrast is present.  Sagittal and coronal MPR images reconstructed from axial data set.  Comparison: 05/17/2012  Findings: Mild atelectasis left lung base. Right femoral line, tip IVC. Nasogastric tube in stomach. Minimal pericardial effusion Slightly increased. Streak artifacts from the patient's arms traverse the upper abdomen. Low to intermediate attenuation lesion within the anterior 5.1 x 4.0 cm image 30.  Within limits of a nonenhanced exam, no focal abnormalities of the liver, spleen, pancreas, kidneys or adrenal glands otherwise identified. Atherosclerotic calcifications without aneurysm. Large and small bowel loops are underdistended making assessment of wall thickness suboptimal. However there appears to be diffuse wall thickening of the colon with adjacent pericolic infiltration and multiple sites question colitis. Minimal sigmoid diverticulosis incidentally noted.  Bladder partially distended, cystocele noted. Probable 10 mm uterine leiomyoma at fundus. Scattered normal-sized mesenteric nodes medial to right colon. No additional mass, adenopathy, free fluid, or free air. Bones appear diffusely demineralized.  IMPRESSION: Probable diffuse colitis; differential diagnosis includes infection, inflammatory bowel disease, and ischemia; when I called results to the patient's nurse, he indicated a new positive laboratory test for Clostridium difficile, which would account for this finding. Minimal pericardial effusion, increased. Remainder of exam unchanged from prior recent CT.  Findings called to [facility] on 2100 on  05/24/2012 at 1653 hours.   Original Report Authenticated By: Ulyses Southward, M.D.    Ct Abdomen Pelvis Wo Contrast  05/17/2012  *RADIOLOGY REPORT*  Clinical Data: Rectal bleeding and lower abdominal pain.  CT ABDOMEN AND PELVIS WITHOUT CONTRAST  Technique:  Multidetector CT imaging of the abdomen and pelvis was performed following the standard protocol without intravenous contrast.  Comparison: CT abdomen pelvis 08/19/2009  Findings: At the lung base, heavy coronary artery atherosclerotic calcification is noted.  The heart is not completely imaged.  The visualized lungs are clear.  The noncontrast appearance of the liver, spleen, adrenal glands, and pancreas is within normal limits.  A 4.5 x 3.7 centimeter water density lesion in the mid pole of the left kidney statistically most likely a renal cyst, but not completely characterized on noncontrast CT.  There are multiple hyperdense rounded lesions associated with the left renal cortex, measuring in the range of 7- 8 mm, which may be hyperdense cysts.  A few calcifications associated with both kidneys are favored to be vascular calcifications.  There is no hydronephrosis.  A hyperdense lesion associated the lower pole of the right  kidney measures 10 mm.  There is extensive atherosclerotic calcification of the aortoiliac vasculature.  There is infrarenal abdominal aortic ectasia measuring up to 2.2 cm AP diameter.  Negative for aneurysm.  The appendix is identified, and courses posterior to the cecum. Proximally, the appendix measures 8-9 mm in diameter, mildly prominent.  The distal tip of the appendix measures 5 mm and contains a small locule of gas within its lumen.  The proximal aspect of the appendix does not contain gas.  No periappendiceal inflammatory stranding is appreciated.  There is extensive diverticulosis of the sigmoid colon.  There is some sigmoid wall thickening (for example image number 58).  No discrete inflammatory stranding is seen in association  with the diverticula.  There is a moderate amount of stool in the colon. There is no evidence of bowel obstruction.  The uterus is retroverted and contains a few coarse calcifications, suggesting the presence of uterine fibroids.  Urinary bladder is unremarkable.  There is degenerative disc disease at L5-S1.  Vertebral bodies are normal in height and alignment.  IMPRESSION:  1.  Early appendicitis cannot be excluded, but is not definite. The proximal appendix measures prominent in caliber, up to 8 to 9 mm, but it is noted that the proximal appendix measured approximately 8 mm on the prior CT of March 2011.  No periappendiceal inflammatory stranding is seen.  Suggest clinical correlation, and follow-up imaging as warranted. 2.  Diverticulosis of the sigmoid colon without definite CT evidence of acute diverticulitis.  In the region of the diverticula, there is suggestion of sigmoid colon wall thickening, which may be secondary to muscular hypertrophy related to diverticulosis.  Acute colitis or underlying neoplasm cannot be excluded. 3.  4.6 cm water density left renal mass.  This is likely renal cyst.  This could be confirmed with renal ultrasound is desired. 4.  Several small bilateral, left greater than right, high density renal lesions likely reflect complex renal cysts.  These cannot be completely characterized on noncontrast CT. 5.  Aortoiliac atherosclerosis with ectasia of the infrarenal abdominal aorta.   Original Report Authenticated By: Britta Mccreedy, M.D.    Dg Chest 1 View  05/19/2012  *RADIOLOGY REPORT*  Clinical Data: Larey Seat from bed  CHEST - 1 VIEW  Comparison: 02/27/2012  Findings: Left jugular dual-lumen central venous catheter tip projecting over cavoatrial junction. Enlargement of cardiac silhouette with pulmonary vascular congestion. Mild diffuse increased interstitial markings in both lungs question minimal edema. No segmental consolidation, pleural effusion or pneumothorax. Bones demineralized.   IMPRESSION: Enlargement of cardiac silhouette with pulmonary vascular congestion. Question minimal pulmonary edema.   Original Report Authenticated By: Ulyses Southward, M.D.    Dg Femur Left  06/01/2012  *RADIOLOGY REPORT*  Clinical Data: Follow-up femur fracture with reduction  LEFT FEMUR - 2 VIEW  Comparison: 05/20/2012  Findings: Intramedullary nail extends from the distal femur to the proximal metaphysis.  There are proximal and distal securing screws.  Comminuted fracture at the distal diaphyseal metaphyseal junction region appears the same as on the previous study.  Overall alignment is near anatomic.  Position of the fracture fragments is unchanged.  IMPRESSION: No change since 05/20/2012.  Intramedullary nail placement for treatment of distal femur fracture.   Original Report Authenticated By: Paulina Fusi, M.D.    Dg Femur Left  05/20/2012  *RADIOLOGY REPORT*  Clinical Data: Post retrograde line and nail of the left femur  DG C-ARM 61-120 MIN, LEFT FEMUR - 2 VIEW  Comparison:  Left femur radiographs -  05/19/2012  Findings:  Six spot intraoperative fluoroscopic images of the left femur are provided for review.  Images demonstrate retrograde intramedullary nail fixation of previously noted distal diaphyseal/metaphyseal comminuted femur fracture. Alignment is much improved.  The distal end of the intramedullary rod is transfixed with three cancellous screws.  The proximal end of the femoral rods transfixed with two cancellous screws.  Skin staples overlying the proximal thigh. No radiopaque foreign body.  IMPRESSION: Post intramedullary rod fixation of comminuted distal diaphyseal/metaphyseal femoral fracture.   Original Report Authenticated By: Tacey Ruiz, MD    Dg Femur Left  05/19/2012  *RADIOLOGY REPORT*  Clinical Data: Fall  LEFT FEMUR - 2 VIEW  Comparison: None.  Findings: Three views of the left femur submitted.  There is displaced comminuted fracture in distal left femoral shaft.  IMPRESSION:  Displaced comminuted fracture distal left femoral shaft.   Original Report Authenticated By: Natasha Mead, M.D.    Dg Tibia/fibula Left  05/19/2012  *RADIOLOGY REPORT*  Clinical Data: Fall  LEFT TIBIA AND FIBULA - 2 VIEW  Comparison: None.  Findings: Two views of the left tibia-fibula submitted.  No acute fracture or subluxation.  Atherosclerotic vascular calcifications are noted.  IMPRESSION: No acute fracture or subluxation.   Original Report Authenticated By: Natasha Mead, M.D.    Ir Fluoro Guide Cv Line Right  05/17/2012  *RADIOLOGY REPORT*  Clinical data:  End-stage renal disease.  Poor function of tunneled left IJ hemodialysis catheter.  CATHETER INJECTION UNDER FLUOROSCOPY EXCHANGE OF TUNNELED HEMODIALYSIS CENTRAL VENOUS CATHETER  Technique and findings:  Initially, water-soluble contrast was injected through both lumens of the previously placed tunneled left IJ hemodialysis catheter sequentially.  This demonstrated position of the tip of the catheter in the IVC near hepatic vein inflows.  There is occlusion of the distal aspect of the blue venous return lumen.  The catheter and surrounding skin were then prepped and draped in usual sterile fashion. Maximal barrier sterile technique was utilized including caps, mask, sterile gowns, sterile gloves, sterile drape, hand hygiene and skin antiseptic.  As antibiotic prophylaxis, cefazolin 1 gram was ordered pre- procedure and administered intravenously within one hour of incision.  Intravenous Fentanyl and Versed were administered as conscious sedation during continuous cardiorespiratory monitoring by the radiology RN, with a total moderate sedation time of 12 minutes.  The previously placed catheter was dissected free of the underlying subcutaneous tissues and removed partially over an angled stiff glide wire.  Contrast injection demonstrated no significant fibrin sheath or SVC thrombus/stenosis.  The catheter was then exchanged over two parallel stiff glide  wires for a new 19 cm Equistream hemodialysis catheter, placed with the tips in the mid right atrium.  Spot chest radiograph confirms appropriate catheter position.  Both lumens aspirated and flushed easily.  Both lumens were flushed per protocol.  The catheter was secured externally with O-Prolene sutures and a sterile dressing applied. The patient tolerated the procedure well.  No immediate complication.  IMPRESSION: 1.  Low position of the tips of the previously placed hemodialysis catheter, in the intrahepatic segment of the IVC. 2.  Technically successful exchange and repositioning of tunneled left IJ hemodialysis catheter, okay for routine use.   Original Report Authenticated By: D. Andria Rhein, MD    Ir Cv Line Injection  05/17/2012  *RADIOLOGY REPORT*  Clinical data:  End-stage renal disease.  Poor function of tunneled left IJ hemodialysis catheter.  CATHETER INJECTION UNDER FLUOROSCOPY EXCHANGE OF TUNNELED HEMODIALYSIS CENTRAL VENOUS CATHETER  Technique and  findings:  Initially, water-soluble contrast was injected through both lumens of the previously placed tunneled left IJ hemodialysis catheter sequentially.  This demonstrated position of the tip of the catheter in the IVC near hepatic vein inflows.  There is occlusion of the distal aspect of the blue venous return lumen.  The catheter and surrounding skin were then prepped and draped in usual sterile fashion. Maximal barrier sterile technique was utilized including caps, mask, sterile gowns, sterile gloves, sterile drape, hand hygiene and skin antiseptic.  As antibiotic prophylaxis, cefazolin 1 gram was ordered pre- procedure and administered intravenously within one hour of incision.  Intravenous Fentanyl and Versed were administered as conscious sedation during continuous cardiorespiratory monitoring by the radiology RN, with a total moderate sedation time of 12 minutes.  The previously placed catheter was dissected free of the underlying  subcutaneous tissues and removed partially over an angled stiff glide wire.  Contrast injection demonstrated no significant fibrin sheath or SVC thrombus/stenosis.  The catheter was then exchanged over two parallel stiff glide wires for a new 19 cm Equistream hemodialysis catheter, placed with the tips in the mid right atrium.  Spot chest radiograph confirms appropriate catheter position.  Both lumens aspirated and flushed easily.  Both lumens were flushed per protocol.  The catheter was secured externally with O-Prolene sutures and a sterile dressing applied. The patient tolerated the procedure well.  No immediate complication.  IMPRESSION: 1.  Low position of the tips of the previously placed hemodialysis catheter, in the intrahepatic segment of the IVC. 2.  Technically successful exchange and repositioning of tunneled left IJ hemodialysis catheter, okay for routine use.   Original Report Authenticated By: D. Andria Rhein, MD    Dg Chest Port 1 View  05/31/2012  *RADIOLOGY REPORT*  Clinical Data: Possible aspiration and cough, nausea and vomiting. Decreased O2 saturation.  PORTABLE CHEST - 1 VIEW  Comparison: Chest radiograph performed 05/24/2012  Findings: The lungs are well-aerated.  Mild right basilar airspace opacification raises concern for pneumonia; aspiration could have such an appearance.  There is no evidence of pleural effusion or pneumothorax.  The cardiomediastinal silhouette is normal in size; calcification is noted within the aortic arch.  A left IJ dual lumen catheter is noted ending about the distal SVC.  No acute osseous abnormalities are seen.  IMPRESSION: New mild right basilar airspace opacification, concerning for pneumonia; aspiration could have such an appearance.   Original Report Authenticated By: Tonia Ghent, M.D.    Dg Chest Port 1 View  05/24/2012  *RADIOLOGY REPORT*  Clinical Data: Evaluate nasogastric tube and endotracheal tube placements  PORTABLE CHEST - 1 VIEW  Comparison:  Portable exam 1303 hours compared to 05/24/2012  Findings: Tip of endotracheal tube 1.5 cm above carina. Left jugular dual-lumen large-bore central venous catheter with tip projecting over the cavoatrial junction. Nasogastric tube extends into stomach. Upper normal-size of cardiac silhouette. Atherosclerotic calcification aorta. Mediastinal contours and pulmonary vascularity normal. No definite infiltrate, pleural effusion or pneumothorax. No acute osseous findings.  IMPRESSION: No acute abnormalities. Line and tube positions as above.   Original Report Authenticated By: Ulyses Southward, M.D.    Dg Chest Port 1 View  05/24/2012  **ADDENDUM** CREATED: 05/24/2012 10:14:16  Comparison exams from 05/19/2012 and 02/10/2011 are available.  Hilar prominence unchanged.  Pulmonary vascular congestion/mild pulmonary edema slightly more notable than on the prior exams.  **END ADDENDUM** SIGNED BY: Almedia Balls. Constance Goltz, M.D.   05/24/2012  *RADIOLOGY REPORT*  Clinical Data: Increase shortness of breath.  PORTABLE CHEST - 1 VIEW  Comparison: Currently, there are PACS problems and no comparison exams are available.  Findings: Left central line tip in the region of the distal superior vena cava/cavoatrial junction.  No gross pneumothorax.  Mild pulmonary edema.  Hilar prominence may be vascular in origin.  Stability can be confirmed on follow-up.  Heart size top normal.  Calcified slightly tortuous aorta.  No segmental consolidation.  IMPRESSION: No comparisons presently available.  Mild pulmonary edema.  Please see above.   Original Report Authenticated By: Lacy Duverney, M.D.    Dg Chest [facility]  05/24/2012  Garnetta Buddy, MD     05/24/2012  9:17 AM I have seen and examined this patient and agree with the plan of  care. Low blood pressure and short of breath. patient now being  ultrafiltered. Check CXR doubt that more fluid can be removed.  WEBB,MARTIN W 05/24/2012, 9:16 AM     Dg Femur Left Port  05/20/2012  *RADIOLOGY  REPORT*  Clinical Data: Status post ORIF.  PORTABLE LEFT FEMUR - 2 VIEW  Comparison: Intraoperative fluoro spot images 05/20/2012.  Findings: The patient is status post placement of an intramedullary rod in the left femur.  Three distal and two proximal interlocking screws are in place.  There is slight anterior displacement of approximately one cortex width of the distal comminuted fracture. A moderate sized joint effusion is present.  Atherosclerotic calcifications are present within the femoral artery.  IMPRESSION:  1.  Near anatomic reduction of the comminuted distal femur fracture status post ORIF. 2.  No radiographic evidence for complication. 3.  Atherosclerosis.   Original Report Authenticated By: Marin Roberts, M.D.    Dg Knee Complete 4 Views Left  05/19/2012  *RADIOLOGY REPORT*  Clinical Data: Fall  LEFT KNEE - COMPLETE 4+ VIEW  Comparison: Left femur same day  Findings: Two views of the left knee submitted.  No knee fracture or subluxation.  Again noted displaced comminuted fracture distal left femoral shaft.  IMPRESSION: No knee fracture or subluxation.  Again noted displaced comminuted fracture distal left femoral shaft.   Original Report Authenticated By: Natasha Mead, M.D.    Dg C-arm 61-120 Min  05/20/2012  *RADIOLOGY REPORT*  Clinical Data: Post retrograde line and nail of the left femur  DG C-ARM 61-120 MIN, LEFT FEMUR - 2 VIEW  Comparison:  Left femur radiographs - 05/19/2012  Findings:  Six spot intraoperative fluoroscopic images of the left femur are provided for review.  Images demonstrate retrograde intramedullary nail fixation of previously noted distal diaphyseal/metaphyseal comminuted femur fracture. Alignment is much improved.  The distal end of the intramedullary rod is transfixed with three cancellous screws.  The proximal end of the femoral rods transfixed with two cancellous screws.  Skin staples overlying the proximal thigh. No radiopaque foreign body.  IMPRESSION: Post  intramedullary rod fixation of comminuted distal diaphyseal/metaphyseal femoral fracture.   Original Report Authenticated By: Tacey Ruiz, MD     Medications: Scheduled Meds:    . cholestyramine light  4 g Oral Daily  . darbepoetin (ARANESP) injection - DIALYSIS  200 mcg Intravenous Q Mon-HD  . feeding supplement (NEPRO CARB STEADY)  237 mL Oral BID BM  . feeding supplement  30 mL Oral BID WC  . ferric gluconate (FERRLECIT/NULECIT) IV  125 mg Intravenous Q M,W,F-HD  . insulin aspart  0-15 Units Subcutaneous Q4H  . levofloxacin  500 mg Oral Q48H  . levothyroxine  75 mcg Oral QAC breakfast  .  metronidazole  500 mg Intravenous Q8H  . multivitamin  1 tablet Oral Daily  . niacin  500 mg Oral Daily  . paricalcitol  1 mcg Intravenous 3 times weekly  . sodium chloride  250 mL Intravenous Once  . vancomycin  500 mg Oral Q6H      LOS: 15 days   Eleanor Dimichele M.D. [facility] 06/03/2012, 1:03 PM Pager: [phone]  If 7PM-7AM, please contact night-coverage [email or web address] Password [email or web address]

## 2012-06-04 DIAGNOSIS — L89609 Pressure ulcer of unspecified heel, unspecified stage: Secondary | ICD-10-CM | POA: Diagnosis not present

## 2012-06-04 DIAGNOSIS — E039 Hypothyroidism, unspecified: Secondary | ICD-10-CM | POA: Diagnosis not present

## 2012-06-04 DIAGNOSIS — L97309 Non-pressure chronic ulcer of unspecified ankle with unspecified severity: Secondary | ICD-10-CM | POA: Diagnosis not present

## 2012-06-04 DIAGNOSIS — J698 Pneumonitis due to inhalation of other solids and liquids: Secondary | ICD-10-CM | POA: Diagnosis not present

## 2012-06-04 DIAGNOSIS — I120 Hypertensive chronic kidney disease with stage 5 chronic kidney disease or end stage renal disease: Secondary | ICD-10-CM | POA: Diagnosis not present

## 2012-06-04 DIAGNOSIS — E1159 Type 2 diabetes mellitus with other circulatory complications: Secondary | ICD-10-CM | POA: Diagnosis not present

## 2012-06-04 DIAGNOSIS — E1129 Type 2 diabetes mellitus with other diabetic kidney complication: Secondary | ICD-10-CM | POA: Diagnosis not present

## 2012-06-04 DIAGNOSIS — Z01810 Encounter for preprocedural cardiovascular examination: Secondary | ICD-10-CM | POA: Diagnosis not present

## 2012-06-04 DIAGNOSIS — M79609 Pain in unspecified limb: Secondary | ICD-10-CM | POA: Diagnosis not present

## 2012-06-04 DIAGNOSIS — K5289 Other specified noninfective gastroenteritis and colitis: Secondary | ICD-10-CM | POA: Diagnosis not present

## 2012-06-04 DIAGNOSIS — M25559 Pain in unspecified hip: Secondary | ICD-10-CM | POA: Diagnosis not present

## 2012-06-04 DIAGNOSIS — L97909 Non-pressure chronic ulcer of unspecified part of unspecified lower leg with unspecified severity: Secondary | ICD-10-CM | POA: Diagnosis not present

## 2012-06-04 DIAGNOSIS — R262 Difficulty in walking, not elsewhere classified: Secondary | ICD-10-CM | POA: Diagnosis not present

## 2012-06-04 DIAGNOSIS — I70269 Atherosclerosis of native arteries of extremities with gangrene, unspecified extremity: Secondary | ICD-10-CM | POA: Diagnosis not present

## 2012-06-04 DIAGNOSIS — N186 End stage renal disease: Secondary | ICD-10-CM | POA: Diagnosis not present

## 2012-06-04 DIAGNOSIS — N185 Chronic kidney disease, stage 5: Secondary | ICD-10-CM | POA: Diagnosis not present

## 2012-06-04 DIAGNOSIS — E785 Hyperlipidemia, unspecified: Secondary | ICD-10-CM | POA: Diagnosis present

## 2012-06-04 DIAGNOSIS — I96 Gangrene, not elsewhere classified: Secondary | ICD-10-CM | POA: Diagnosis not present

## 2012-06-04 DIAGNOSIS — Z992 Dependence on renal dialysis: Secondary | ICD-10-CM | POA: Diagnosis not present

## 2012-06-04 DIAGNOSIS — Z79899 Other long term (current) drug therapy: Secondary | ICD-10-CM | POA: Diagnosis not present

## 2012-06-04 DIAGNOSIS — I70219 Atherosclerosis of native arteries of extremities with intermittent claudication, unspecified extremity: Secondary | ICD-10-CM | POA: Diagnosis not present

## 2012-06-04 DIAGNOSIS — M1710 Unilateral primary osteoarthritis, unspecified knee: Secondary | ICD-10-CM | POA: Diagnosis present

## 2012-06-04 DIAGNOSIS — I798 Other disorders of arteries, arterioles and capillaries in diseases classified elsewhere: Secondary | ICD-10-CM | POA: Diagnosis present

## 2012-06-04 DIAGNOSIS — S7290XA Unspecified fracture of unspecified femur, initial encounter for closed fracture: Secondary | ICD-10-CM | POA: Diagnosis not present

## 2012-06-04 DIAGNOSIS — J209 Acute bronchitis, unspecified: Secondary | ICD-10-CM

## 2012-06-04 DIAGNOSIS — L8995 Pressure ulcer of unspecified site, unstageable: Secondary | ICD-10-CM | POA: Diagnosis not present

## 2012-06-04 DIAGNOSIS — Z87891 Personal history of nicotine dependence: Secondary | ICD-10-CM | POA: Diagnosis not present

## 2012-06-04 DIAGNOSIS — T847XXA Infection and inflammatory reaction due to other internal orthopedic prosthetic devices, implants and grafts, initial encounter: Secondary | ICD-10-CM | POA: Diagnosis not present

## 2012-06-04 DIAGNOSIS — E876 Hypokalemia: Secondary | ICD-10-CM | POA: Diagnosis not present

## 2012-06-04 DIAGNOSIS — R109 Unspecified abdominal pain: Secondary | ICD-10-CM | POA: Diagnosis not present

## 2012-06-04 DIAGNOSIS — S72453A Displaced supracondylar fracture without intracondylar extension of lower end of unspecified femur, initial encounter for closed fracture: Secondary | ICD-10-CM | POA: Diagnosis not present

## 2012-06-04 DIAGNOSIS — D649 Anemia, unspecified: Secondary | ICD-10-CM | POA: Diagnosis not present

## 2012-06-04 DIAGNOSIS — Z794 Long term (current) use of insulin: Secondary | ICD-10-CM | POA: Diagnosis not present

## 2012-06-04 DIAGNOSIS — J9600 Acute respiratory failure, unspecified whether with hypoxia or hypercapnia: Secondary | ICD-10-CM | POA: Diagnosis not present

## 2012-06-04 DIAGNOSIS — D509 Iron deficiency anemia, unspecified: Secondary | ICD-10-CM | POA: Diagnosis not present

## 2012-06-04 DIAGNOSIS — Z7721 Contact with and (suspected) exposure to potentially hazardous body fluids: Secondary | ICD-10-CM | POA: Diagnosis not present

## 2012-06-04 DIAGNOSIS — J690 Pneumonitis due to inhalation of food and vomit: Secondary | ICD-10-CM | POA: Diagnosis not present

## 2012-06-04 DIAGNOSIS — N19 Unspecified kidney failure: Secondary | ICD-10-CM | POA: Diagnosis not present

## 2012-06-04 DIAGNOSIS — R0989 Other specified symptoms and signs involving the circulatory and respiratory systems: Secondary | ICD-10-CM | POA: Diagnosis not present

## 2012-06-04 DIAGNOSIS — J449 Chronic obstructive pulmonary disease, unspecified: Secondary | ICD-10-CM | POA: Diagnosis present

## 2012-06-04 DIAGNOSIS — E119 Type 2 diabetes mellitus without complications: Secondary | ICD-10-CM | POA: Diagnosis not present

## 2012-06-04 DIAGNOSIS — T82898A Other specified complication of vascular prosthetic devices, implants and grafts, initial encounter: Secondary | ICD-10-CM | POA: Diagnosis not present

## 2012-06-04 DIAGNOSIS — G9341 Metabolic encephalopathy: Secondary | ICD-10-CM | POA: Diagnosis not present

## 2012-06-04 DIAGNOSIS — D631 Anemia in chronic kidney disease: Secondary | ICD-10-CM | POA: Diagnosis not present

## 2012-06-04 DIAGNOSIS — S72309A Unspecified fracture of shaft of unspecified femur, initial encounter for closed fracture: Secondary | ICD-10-CM | POA: Diagnosis not present

## 2012-06-04 DIAGNOSIS — R488 Other symbolic dysfunctions: Secondary | ICD-10-CM | POA: Diagnosis not present

## 2012-06-04 DIAGNOSIS — A419 Sepsis, unspecified organism: Secondary | ICD-10-CM | POA: Diagnosis not present

## 2012-06-04 DIAGNOSIS — M6281 Muscle weakness (generalized): Secondary | ICD-10-CM | POA: Diagnosis not present

## 2012-06-04 DIAGNOSIS — N2581 Secondary hyperparathyroidism of renal origin: Secondary | ICD-10-CM | POA: Diagnosis present

## 2012-06-04 DIAGNOSIS — I739 Peripheral vascular disease, unspecified: Secondary | ICD-10-CM | POA: Diagnosis present

## 2012-06-04 DIAGNOSIS — E1165 Type 2 diabetes mellitus with hyperglycemia: Secondary | ICD-10-CM | POA: Diagnosis not present

## 2012-06-04 LAB — RENAL FUNCTION PANEL
Albumin: 2.1 g/dL — ABNORMAL LOW (ref 3.5–5.2)
BUN: 33 mg/dL — ABNORMAL HIGH (ref 6–23)
CO2: 24 mEq/L (ref 19–32)
Calcium: 7.5 mg/dL — ABNORMAL LOW (ref 8.4–10.5)
Chloride: 97 mEq/L (ref 96–112)
Creatinine, Ser: 6.95 mg/dL — ABNORMAL HIGH (ref 0.50–1.10)
GFR calc Af Amer: 6 mL/min — ABNORMAL LOW (ref >90)
GFR calc non Af Amer: 6 mL/min — ABNORMAL LOW (ref >90)
Glucose, Bld: 162 mg/dL — ABNORMAL HIGH (ref 70–99)
Phosphorus: 5.2 mg/dL — ABNORMAL HIGH (ref 2.3–4.6)
Potassium: 3.5 mEq/L (ref 3.5–5.1)
Sodium: 137 mEq/L (ref 135–145)

## 2012-06-04 LAB — GLUCOSE, CAPILLARY
Glucose-Capillary: 120 mg/dL — ABNORMAL HIGH (ref 70–99)
Glucose-Capillary: 200 mg/dL — ABNORMAL HIGH (ref 70–99)

## 2012-06-04 LAB — CBC
HCT: 22.9 % — ABNORMAL LOW (ref 36.0–46.0)
Hemoglobin: 6.9 g/dL — CL (ref 12.0–15.0)
MCH: 25.7 pg — ABNORMAL LOW (ref 26.0–34.0)
MCHC: 30.1 g/dL (ref 30.0–36.0)
MCV: 85.4 fL (ref 78.0–100.0)
Platelets: 201 10*3/uL (ref 150–400)
RBC: 2.68 MIL/uL — ABNORMAL LOW (ref 3.87–5.11)
RDW: 22.2 % — ABNORMAL HIGH (ref 11.5–15.5)
WBC: 14.3 10*3/uL — ABNORMAL HIGH (ref 4.0–10.5)

## 2012-06-04 MED ORDER — CALCIUM ACETATE 667 MG PO CAPS: 667.0000 mg | ORAL_CAPSULE | Freq: Two times a day (BID) | ORAL | Status: AC

## 2012-06-04 MED ORDER — LEVOFLOXACIN 500 MG PO TABS: 500.0000 mg | ORAL_TABLET | ORAL | Status: DC

## 2012-06-04 MED ORDER — CALCIUM ACETATE 667 MG PO CAPS
667.0000 mg | ORAL_CAPSULE | Freq: Two times a day (BID) | ORAL | Status: DC
  Administered 2012-06-04: 667 mg via ORAL
  Filled 2012-06-04 (×4): qty 1

## 2012-06-04 MED ORDER — DARBEPOETIN ALFA-POLYSORBATE 200 MCG/0.4ML IJ SOLN
INTRAMUSCULAR | Status: AC
  Administered 2012-06-04: 200 ug via INTRAVENOUS
  Filled 2012-06-04: qty 0.4

## 2012-06-04 MED ORDER — PARICALCITOL 5 MCG/ML IV SOLN
INTRAVENOUS | Status: AC
  Administered 2012-06-04: 1 ug via INTRAVENOUS
  Filled 2012-06-04: qty 1

## 2012-06-04 MED ORDER — AMLODIPINE BESYLATE 5 MG PO TABS: 5.0000 mg | ORAL_TABLET | Freq: Every day | ORAL | Status: DC

## 2012-06-04 MED ORDER — METHOCARBAMOL 500 MG PO TABS: 500.0000 mg | ORAL_TABLET | Freq: Four times a day (QID) | ORAL | Status: DC | PRN

## 2012-06-04 MED ORDER — VANCOMYCIN 50 MG/ML ORAL SOLUTION: 500.0000 mg | Freq: Four times a day (QID) | ORAL | Status: DC

## 2012-06-04 MED ORDER — AMLODIPINE BESYLATE 5 MG PO TABS
5.0000 mg | ORAL_TABLET | Freq: Every day | ORAL | Status: DC
  Filled 2012-06-04: qty 1

## 2012-06-04 NOTE — Progress Notes (Signed)
Patient was discharged by ambulance to [facility]. Patient was stable upon discharge. Patients family members were present upon discharge. Report was called prior to patients departure.

## 2012-06-04 NOTE — Progress Notes (Signed)
Chart review complete.  Patient is not appropriate for [facility] services at this time because her admitting diagnosis of falls and primary diagnosis of ESRD do not meet admission criteria. Of note, [facility] Care Management services does not replace or interfere with any services that are arranged by inpatient case management or social work.  For additional questions or referrals please contact Anibal Henderson BSN RN [facility] Liaison at [phone].

## 2012-06-04 NOTE — Clinical Social Work Note (Signed)
CSW was consulted to complete discharge of patient. Pt to transfer to [facility] today via [facility]. Facility is aware of d/c. D/C packet complete with chart copy, signed FL2, and signed hard Rx.  CSW signing off as no other CSW needs identified at this time.  Lia Foyer, LCSWA [facility] Clinical Social Worker Contact #: [phone] (PRN)

## 2012-06-04 NOTE — Procedures (Signed)
Patient was seen on dialysis and the procedure was supervised.  BFR 400  Via PC BP is  143/71.   Patient appears to be tolerating treatment well  [name] A 06/04/2012

## 2012-06-04 NOTE — Consult Note (Signed)
WOC consult Note  Reason for Consult: Consult requested for blistering to leg.  Pt fell at home and has sustained a fracture to the left leg.  Appears to have had a previous blistering which is beginning to evolve into loose dark brown areas of skin without fluid.  2 areas; 2X2cm and 6X8cm.  Appearance is consistent with probable fracture blisters over affected areas.  No topical treatment is necessary at this time; no open wounds or drainage.  Pt plans to be followed by ortho service who can provide further plan of care. Will not plan to follow further unless re-consulted.  [address], RN, MSN, [employer]  [phone]

## 2012-06-04 NOTE — Discharge Summary (Signed)
Physician Discharge Summary  Patient ID: [name] MRN: [id number] DOB/AGE: 01-29-1946 67 y.o.  Admit date: 05/19/2012 Discharge date: 06/04/2012  Primary Care Physician:  Avon Gully, MD  Discharge Diagnoses:      L. Distal Femur Fracture, supracondylar s/p IM left femoral nailing . ANEMIA OF RENAL FAILURE . RENAL DISEASE, CHRONIC, STAGE V/ end-stage renal disease on hemodialysis  . HYPERTENSION . Femur fracture, left . Fever . Acute respiratory failure with aspiration pneumonia  . Clostridium difficile colitis on oral vancomycin . Septic shock resolved Metabolic encephalopathy- resolved   Consults:  Pulmonary critical care service                      Nephrology                      Orthopedics, Dr. Carola Frost    Discharge Medications:   Medication List     As of 06/04/2012  1:46 PM    STOP taking these medications         bisacodyl 5 MG EC tablet   Commonly known as: DULCOLAX      doxazosin 8 MG tablet   Commonly known as: CARDURA      glipiZIDE 10 MG tablet   Commonly known as: GLUCOTROL      insulin lispro protamine-insulin lispro (75-25) 100 UNIT/ML Susp   Commonly known as: HUMALOG 75/25      lisinopril 20 MG tablet   Commonly known as: PRINIVIL,ZESTRIL      TAKE these medications         amLODipine 5 MG tablet   Commonly known as: NORVASC   Take 1 tablet (5 mg total) by mouth daily.      calcium acetate 667 MG capsule   Commonly known as: PHOSLO   Take 1 capsule (667 mg total) by mouth 2 (two) times daily with a meal.      chlorpheniramine-HYDROcodone 10-8 MG/5ML Lqcr   Commonly known as: TUSSIONEX   Take 5 mLs by mouth every 12 (twelve) hours as needed (cough).      feeding supplement (NEPRO CARB STEADY) Liqd   Take 237 mLs by mouth 2 (two) times daily between meals.      feeding supplement Liqd   Take 30 mLs by mouth 2 (two) times daily with breakfast and lunch.      fluticasone 50 MCG/ACT nasal spray   Commonly known as: FLONASE   Place 2 sprays into the nose Daily.      insulin aspart 100 UNIT/ML injection   Commonly known as: novoLOG   Inject 0-15 Units into the skin every 4 (four) hours. CBG < 70: Drink juice; CBG 70 - 120: 0 units: CBG 121 - 150: 2 units; CBG 151 - 200: 3 units; CBG 201 - 250: 5 units; CBG 251 - 300: 8 units;CBG 301 - 350: 11 units; CBG 351 - 400: 15 units; CBG > 400 : 15 units and Call MD      levofloxacin 500 MG tablet   Commonly known as: LEVAQUIN   Take 1 tablet (500 mg total) by mouth every other day. X 4 more doses      levothyroxine 75 MCG tablet   Commonly known as: SYNTHROID, LEVOTHROID   Take 75 mcg by mouth daily.      methocarbamol 500 MG tablet   Commonly known as: ROBAXIN   Take 1 tablet (500 mg total) by mouth every 6 (six) hours as needed.  metoCLOPramide 5 MG tablet   Commonly known as: REGLAN   Take 1-2 tablets (5-10 mg total) by mouth every 8 (eight) hours as needed (if ondansetron (ZOFRAN) ineffective.).      multivitamin Tabs tablet   Take 1 tablet by mouth daily.      niacin 500 MG CR tablet   Commonly known as: NIASPAN   Take 500 mg by mouth daily.      ondansetron 4 MG tablet   Commonly known as: ZOFRAN   Take 1 tablet (4 mg total) by mouth every 6 (six) hours as needed for nausea.      oxyCODONE 5 MG immediate release tablet   Commonly known as: Oxy IR/ROXICODONE   Take 1 tablet (5 mg total) by mouth every 4 (four) hours as needed.      sodium chloride 0.9 % SOLN 100 mL with ferric gluconate 12.5 MG/ML SOLN 125 mg   Inject 125 mg into the vein every Monday, Wednesday, and Friday with hemodialysis.      vancomycin 50 mg/mL oral solution   Commonly known as: VANCOCIN   Take 10 mLs (500 mg total) by mouth every 6 (six) hours. X 14 days          Brief H and P: For complete details please refer to admission H and P, but in brief in brief 67 yo female esrd hd m/w/f and sometimes sat was getting up out of bed tonight when she tripped and fell and hurt  her left leg. Presented to [facility] ED and found to have femur fracture so transferred here for further care. Orthopedics was called here at [facility] (no ortho coverage at [facility] on weekend) who requested transfer.  Hospital Course:   Pt is a 67 y/o female with ERSD admitted from [facility] ED s/p mechanical fall with L hip fracture and was transferred to [facility] and ortho consulted for IM nailing 12/22. On 12/26 she was noted to have fever of 103, tachycardia, dyspnea and change in mental status concerning for sepsis. PCCM was consulted for sepsis. Found to have c diff colitis & septic shock requiring mechanical ventialtion 12/26>>12/27.  L. Distal Femur Fracture, supracondylar  -s/p IM nailing on 12/22 per Dr Carola Frost, follow-up out-patient with Dr Carola Frost in 10 days after DC. PT/OT evaluations recommended SNF for rehab.   Aspiration pneumonia  Started on Zosyn on 06/01/2011. CXR on 06/01/2011 showed new mild right basilar airspace opacification concerning for pneumonia. Discontinued zosyn and placed on oral Levofloxacin for 4 more doses left. Repeat chest X-ray can be obtained outpatient in next 2 weeks for complete resolution.   Acute Resp failure/sepsis with metabolic encephalopathy: resolved  Intubated 12/26>12/27 per CCM. Resolved. Was found to be C.DIFF positive on 05/24/12 which was likely the source of septic shock, improved on abx. Continue oral vanc. Initial procalcitonin 17.02, lactic acid 1.1. Blood cultures has been negative so far.   C.diff colitis- improving  CT abd and pelvis with diffuse colitis--no perforation or abscess. Clinically improving. Continue oral vancomycin every 6 hours, plan 14 days for now, may need a longer course given patient has been started on antibiotics for Aspiration pneumonia. She was placed on flexi seal which has been off and diarrhea has improved.  ESRD  Dialysis per renal, patient is on Monday-Wed-Friday HD  Her last HD was done today AM.  Anemia  On aranesp, she is Jehovah's  witness>> no blood transfusion   HTN  Outpt meds were placed on hold but now back on  cardura and norvasc.    Decondtioning  PT recommending SNF    Day of Discharge BP 167/77  Pulse 102  Temp 98.5 F (36.9 C) (Oral)  Resp 18  Ht 5\' 3"  (1.6 m)  Wt 76.7 kg (169 lb 1.5 oz)  BMI 29.95 kg/m2  SpO2 91%  Physical Exam: General: Alert and awake oriented x3 not in any acute distress. HEENT: anicteric sclera, pupils reactive to light and accommodation CVS: S1-S2 clear no murmur rubs or gallops Chest: clear to auscultation bilaterally, no wheezing rales or rhonchi Abdomen: soft nontender, nondistended, normal bowel sounds, no organomegaly Extremities: no cyanosis, clubbing or edema noted bilaterally Neuro: Cranial nerves II-XII intact, no focal neurological deficits   The results of significant diagnostics from this hospitalization (including imaging, microbiology, ancillary and laboratory) are listed below for reference.    LAB RESULTS: Basic Metabolic Panel:  Lab 06/04/12 1610 06/01/12 2020 05/28/12 1654  NA 137 132* --  K 3.5 5.9* --  CL 97 93* --  CO2 24 23 --  GLUCOSE 162* 188* --  BUN 33* 45* --  CREATININE 6.95* 7.49* --  CALCIUM 7.5* 8.1* --  MG -- -- 1.9  PHOS 5.2* -- --   Liver Function Tests:  Lab 06/04/12 0636 05/29/12 0535  AST -- --  ALT -- --  ALKPHOS -- --  BILITOT -- --  PROT -- --  ALBUMIN 2.1* 2.1*  CBC:  Lab 06/04/12 0636 06/01/12 2020  WBC 14.3* 18.4*  NEUTROABS -- --  HGB 6.9* 8.2*  HCT 22.9* 27.8*  MCV 85.4 --  PLT 201 212   CBG:  Lab 06/04/12 1113 06/04/12 0405  GLUCAP 120* 164*    Significant Diagnostic Studies:  Dg Chest 1 View  05/19/2012  *RADIOLOGY REPORT*  Clinical Data: Larey Seat from bed  CHEST - 1 VIEW  Comparison: 02/27/2012  Findings: Left jugular dual-lumen central venous catheter tip projecting over cavoatrial junction. Enlargement of cardiac silhouette with pulmonary vascular congestion. Mild diffuse increased  interstitial markings in both lungs question minimal edema. No segmental consolidation, pleural effusion or pneumothorax. Bones demineralized.  IMPRESSION: Enlargement of cardiac silhouette with pulmonary vascular congestion. Question minimal pulmonary edema.   Original Report Authenticated By: Ulyses Southward, M.D.    Dg Femur Left  05/20/2012  *RADIOLOGY REPORT*  Clinical Data: Post retrograde line and nail of the left femur  DG C-ARM 61-120 MIN, LEFT FEMUR - 2 VIEW  Comparison:  Left femur radiographs - 05/19/2012  Findings:  Six spot intraoperative fluoroscopic images of the left femur are provided for review.  Images demonstrate retrograde intramedullary nail fixation of previously noted distal diaphyseal/metaphyseal comminuted femur fracture. Alignment is much improved.  The distal end of the intramedullary rod is transfixed with three cancellous screws.  The proximal end of the femoral rods transfixed with two cancellous screws.  Skin staples overlying the proximal thigh. No radiopaque foreign body.  IMPRESSION: Post intramedullary rod fixation of comminuted distal diaphyseal/metaphyseal femoral fracture.   Original Report Authenticated By: Tacey Ruiz, MD    Dg Femur Left  05/19/2012  *RADIOLOGY REPORT*  Clinical Data: Fall  LEFT FEMUR - 2 VIEW  Comparison: None.  Findings: Three views of the left femur submitted.  There is displaced comminuted fracture in distal left femoral shaft.  IMPRESSION: Displaced comminuted fracture distal left femoral shaft.   Original Report Authenticated By: Natasha Mead, M.D.    Dg Tibia/fibula Left  05/19/2012  *RADIOLOGY REPORT*  Clinical Data: Fall  LEFT TIBIA AND FIBULA -  2 VIEW  Comparison: None.  Findings: Two views of the left tibia-fibula submitted.  No acute fracture or subluxation.  Atherosclerotic vascular calcifications are noted.  IMPRESSION: No acute fracture or subluxation.   Original Report Authenticated By: Natasha Mead, M.D.    Dg Femur Left  Port  05/20/2012  *RADIOLOGY REPORT*  Clinical Data: Status post ORIF.  PORTABLE LEFT FEMUR - 2 VIEW  Comparison: Intraoperative fluoro spot images 05/20/2012.  Findings: The patient is status post placement of an intramedullary rod in the left femur.  Three distal and two proximal interlocking screws are in place.  There is slight anterior displacement of approximately one cortex width of the distal comminuted fracture. A moderate sized joint effusion is present.  Atherosclerotic calcifications are present within the femoral artery.  IMPRESSION:  1.  Near anatomic reduction of the comminuted distal femur fracture status post ORIF. 2.  No radiographic evidence for complication. 3.  Atherosclerosis.   Original Report Authenticated By: Marin Roberts, M.D.    Dg Knee Complete 4 Views Left  05/19/2012  *RADIOLOGY REPORT*  Clinical Data: Fall  LEFT KNEE - COMPLETE 4+ VIEW  Comparison: Left femur same day  Findings: Two views of the left knee submitted.  No knee fracture or subluxation.  Again noted displaced comminuted fracture distal left femoral shaft.  IMPRESSION: No knee fracture or subluxation.  Again noted displaced comminuted fracture distal left femoral shaft.   Original Report Authenticated By: Natasha Mead, M.D.    Dg C-arm 61-120 Min  05/20/2012  *RADIOLOGY REPORT*  Clinical Data: Post retrograde line and nail of the left femur  DG C-ARM 61-120 MIN, LEFT FEMUR - 2 VIEW  Comparison:  Left femur radiographs - 05/19/2012  Findings:  Six spot intraoperative fluoroscopic images of the left femur are provided for review.  Images demonstrate retrograde intramedullary nail fixation of previously noted distal diaphyseal/metaphyseal comminuted femur fracture. Alignment is much improved.  The distal end of the intramedullary rod is transfixed with three cancellous screws.  The proximal end of the femoral rods transfixed with two cancellous screws.  Skin staples overlying the proximal thigh. No radiopaque  foreign body.  IMPRESSION: Post intramedullary rod fixation of comminuted distal diaphyseal/metaphyseal femoral fracture.   Original Report Authenticated By: Tacey Ruiz, MD      Disposition and Follow-up: Discharge Orders    Future Orders Please Complete By Expires   Diet Carb Modified      Non weight bearing      Comments:   Left Leg   Increase activity slowly      PT range of motion   05/22/13   Comments:   Unrestricted ROM L hip, knee and ankle       DISPOSITION: SNF DIET: carb modified diet ACTIVITY: as tolerated   DISCHARGE FOLLOW-UP Follow-up Information    Follow up with Budd Palmer, MD. Schedule an appointment as soon as possible for a visit in 10 days.   Contact information:   [address] [place] [address] [phone]       Follow up with [facility], MD. Schedule an appointment as soon as possible for a visit in 10 days. (for hospital follow-up)    Contact information:   [address] [place] [address] [phone]          Time spent on Discharge: 45 mins  Signed:   Najee Manninen M.D. [facility] 06/04/2012, 1:46 PM Pager: [phone]

## 2012-06-04 NOTE — Progress Notes (Signed)
Pt talked about how she became ill and came to the hospital. She felt she felt stupid about not knowing what happened and was afraid it may happen again.  Pt also talked about lack of support from her family during her illness - not even a call. She became tearful a couple times about how it makes her feel. I offered Chaplain support to patient by providing encouragement and discussing steps to get family support. She said our visit was very helpful and thanked me for coming by.   Marjory Lies Chaplain

## 2012-06-04 NOTE — Progress Notes (Signed)
CRITICAL VALUE ALERT  Critical value received: Hgb=6.9  Date of notification:  06/04/2012  Time of notification:  0720  Critical value read back:yes  Nurse who received alert:  Margarita Grizzle RN  MD notified (1st page):  Kathrene Bongo  Time of first page: 0722  MD notified (2nd page):  Time of second page:  Responding MD:  Kathrene Bongo  Time MD responded:  [phone]  No further orders received

## 2012-06-04 NOTE — Progress Notes (Signed)
[facility] Progress Note  Subjective:   No more stools. Left lower leg hurts  Objective Filed Vitals:   06/04/12 0700 06/04/12 0730 06/04/12 0800 06/04/12 0830  BP: 134/55 127/65 140/71 135/65  Pulse: 92 95 93 92  Temp:      TempSrc:      Resp:      Height:      Weight:      SpO2:       Physical Exam General: Supine on HD No complaints Heart: RRR Lungs: no wheezes or rales Abdomen: soft Extremities:right leg SCD no edema; left LE tender, mild swelling tr edema Dialysis Access: left I-J qb 400  Dialysis Orders: Center: [place] on MWF. EDW 76.5 kg HD Bath 2K/2.25Ca Time 4 hrs Heparin 7600-U bolus, 2000-U mid HD. Access Left IJ catheter BFR 400 DFR 800 Zemplar 1 mcg IV/HD Epogen 0 Units IV/HD Venofer 100 mg per HD    Assessment/Plan: 1. S/p L distal femur fx w ORIF 12/22 by Dr. Carola Frost - plan d/c to SNF when GI issues stable; still with LLE tenderness - neg DVT 12/24 2. Debility- turned down by CIR, for SNF placement; not sure of timetable 3. ESRD on hd mwf via IJ cath. Blood cx's negative, no evidence of tunneled HD cath infection. HD today K 3.5 on 4 K bath   4. Cdif+- frequency improving with cholestyramine and po Vanc - not clear if intermittent temps related to thi; WBC a little better 5. HTN/volume- Was on lisinopril 20/hs, doxazosin 8/hs and amlodipine 5/d at home; all are on hold. BP improved;  Above outpt EDW but unable to get standing weight. 6. VDRF- resolved 7. 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 No binders or sensipar listed on HD center med list P improved 5.2; add phoslo 1 ac bid 9. Anemia of CKD- Aranesp increased to 200ug/week. Hgb dropped to 6.9; informed pt., Jehovah's Witness will not take transfusion of blood products - on q HD IV Fe since 05/21/12 will change to weekly wed hd;  10. Nutrition - alb 2.1 protein supplement at kid center-/ on bid nepro also 11. DM - BS 119 - 360 per primary 12. Dispo-  plans not clear for when to transfer to SNF   Sheffield Slider, PA-C [facility] Beeper [phone] 06/04/2012,8:47 AM  LOS: 16 days   Patient seen and examined, agree with above note with above modifications. HD today.  Hgb low but is Jehovah's witness.  Stable for discharge it seems ?today.  Sheila Sable, MD 06/04/2012      Additional Objective Labs: Basic Metabolic Panel:  Lab 06/04/12 9562 06/01/12 2020 05/31/12 0630 05/29/12 0535  NA 137 132* 141 --  K 3.5 5.9* 3.7 --  CL 97 93* 98 --  CO2 24 23 27  --  GLUCOSE 162* 188* 208* --  BUN 33* 45* 28* --  CREATININE 6.95* 7.49* 5.32* --  CALCIUM 7.5* 8.1* 8.1* --  ALB -- -- -- --  PHOS 5.2* -- -- 2.6   Liver Function Tests:  Lab 06/04/12 0636 05/29/12 0535  AST -- --  ALT -- --  ALKPHOS -- --  BILITOT -- --  PROT -- --  ALBUMIN 2.1* 2.1*   CBC:  Lab 06/04/12 0636 06/01/12 2020 05/31/12 0630 05/30/12 0615 05/29/12 0535  WBC 14.3* 18.4* 18.9* -- --  NEUTROABS -- -- -- -- --  HGB 6.9* 8.2* 8.0* -- --  HCT 22.9*  27.8* 26.3* -- --  MCV 85.4 85.3 83.8 83.0 82.3  PLT 201 212 196 -- --   B Lab 06/04/12 0405 06/04/12 0020 06/03/12 2019 06/03/12 1624 06/03/12 1219  GLUCAP 164* 200* 172* 119* 360*  Medications:    . sodium chloride 10 mL/hr at 05/30/12 1132      . cholestyramine light  4 g Oral Daily  . darbepoetin (ARANESP) injection - DIALYSIS  200 mcg Intravenous Q Mon-HD  . feeding supplement (NEPRO CARB STEADY)  237 mL Oral BID BM  . feeding supplement  30 mL Oral BID WC  . ferric gluconate (FERRLECIT/NULECIT) IV  125 mg Intravenous Q M,W,F-HD  . insulin aspart  0-15 Units Subcutaneous Q4H  . levofloxacin  500 mg Oral Q48H  . levothyroxine  75 mcg Oral QAC breakfast  . metronidazole  500 mg Intravenous Q8H  . multivitamin  1 tablet Oral Daily  . niacin  500 mg Oral Daily  . paricalcitol  1 mcg Intravenous 3 times weekly  . sodium chloride  250 mL Intravenous Once  . vancomycin  500 mg  Oral Q6H

## 2012-06-06 DIAGNOSIS — E876 Hypokalemia: Secondary | ICD-10-CM | POA: Diagnosis not present

## 2012-06-06 DIAGNOSIS — N039 Chronic nephritic syndrome with unspecified morphologic changes: Secondary | ICD-10-CM | POA: Diagnosis not present

## 2012-06-06 DIAGNOSIS — E039 Hypothyroidism, unspecified: Secondary | ICD-10-CM | POA: Diagnosis not present

## 2012-06-06 DIAGNOSIS — D631 Anemia in chronic kidney disease: Secondary | ICD-10-CM | POA: Diagnosis not present

## 2012-06-06 DIAGNOSIS — Z7721 Contact with and (suspected) exposure to potentially hazardous body fluids: Secondary | ICD-10-CM | POA: Diagnosis not present

## 2012-06-06 DIAGNOSIS — N19 Unspecified kidney failure: Secondary | ICD-10-CM | POA: Diagnosis not present

## 2012-06-06 DIAGNOSIS — N186 End stage renal disease: Secondary | ICD-10-CM | POA: Diagnosis not present

## 2012-06-06 DIAGNOSIS — D649 Anemia, unspecified: Secondary | ICD-10-CM | POA: Diagnosis not present

## 2012-06-06 DIAGNOSIS — N2581 Secondary hyperparathyroidism of renal origin: Secondary | ICD-10-CM | POA: Diagnosis not present

## 2012-06-06 DIAGNOSIS — S7290XA Unspecified fracture of unspecified femur, initial encounter for closed fracture: Secondary | ICD-10-CM | POA: Diagnosis not present

## 2012-06-06 DIAGNOSIS — D509 Iron deficiency anemia, unspecified: Secondary | ICD-10-CM | POA: Diagnosis not present

## 2012-06-08 DIAGNOSIS — N186 End stage renal disease: Secondary | ICD-10-CM | POA: Diagnosis not present

## 2012-06-08 DIAGNOSIS — N2581 Secondary hyperparathyroidism of renal origin: Secondary | ICD-10-CM | POA: Diagnosis not present

## 2012-06-08 DIAGNOSIS — D509 Iron deficiency anemia, unspecified: Secondary | ICD-10-CM | POA: Diagnosis not present

## 2012-06-08 DIAGNOSIS — Z7721 Contact with and (suspected) exposure to potentially hazardous body fluids: Secondary | ICD-10-CM | POA: Diagnosis not present

## 2012-06-08 DIAGNOSIS — E039 Hypothyroidism, unspecified: Secondary | ICD-10-CM | POA: Diagnosis not present

## 2012-06-08 DIAGNOSIS — D631 Anemia in chronic kidney disease: Secondary | ICD-10-CM | POA: Diagnosis not present

## 2012-06-11 DIAGNOSIS — N186 End stage renal disease: Secondary | ICD-10-CM | POA: Diagnosis not present

## 2012-06-11 DIAGNOSIS — N2581 Secondary hyperparathyroidism of renal origin: Secondary | ICD-10-CM | POA: Diagnosis not present

## 2012-06-11 DIAGNOSIS — E039 Hypothyroidism, unspecified: Secondary | ICD-10-CM | POA: Diagnosis not present

## 2012-06-11 DIAGNOSIS — Z7721 Contact with and (suspected) exposure to potentially hazardous body fluids: Secondary | ICD-10-CM | POA: Diagnosis not present

## 2012-06-11 DIAGNOSIS — D631 Anemia in chronic kidney disease: Secondary | ICD-10-CM | POA: Diagnosis not present

## 2012-06-11 DIAGNOSIS — D509 Iron deficiency anemia, unspecified: Secondary | ICD-10-CM | POA: Diagnosis not present

## 2012-06-13 DIAGNOSIS — N2581 Secondary hyperparathyroidism of renal origin: Secondary | ICD-10-CM | POA: Diagnosis not present

## 2012-06-13 DIAGNOSIS — D509 Iron deficiency anemia, unspecified: Secondary | ICD-10-CM | POA: Diagnosis not present

## 2012-06-13 DIAGNOSIS — N039 Chronic nephritic syndrome with unspecified morphologic changes: Secondary | ICD-10-CM | POA: Diagnosis not present

## 2012-06-13 DIAGNOSIS — N186 End stage renal disease: Secondary | ICD-10-CM | POA: Diagnosis not present

## 2012-06-13 DIAGNOSIS — Z7721 Contact with and (suspected) exposure to potentially hazardous body fluids: Secondary | ICD-10-CM | POA: Diagnosis not present

## 2012-06-13 DIAGNOSIS — E039 Hypothyroidism, unspecified: Secondary | ICD-10-CM | POA: Diagnosis not present

## 2012-06-13 DIAGNOSIS — S72309A Unspecified fracture of shaft of unspecified femur, initial encounter for closed fracture: Secondary | ICD-10-CM | POA: Diagnosis not present

## 2012-06-15 DIAGNOSIS — Z7721 Contact with and (suspected) exposure to potentially hazardous body fluids: Secondary | ICD-10-CM | POA: Diagnosis not present

## 2012-06-15 DIAGNOSIS — E039 Hypothyroidism, unspecified: Secondary | ICD-10-CM | POA: Diagnosis not present

## 2012-06-15 DIAGNOSIS — N2581 Secondary hyperparathyroidism of renal origin: Secondary | ICD-10-CM | POA: Diagnosis not present

## 2012-06-15 DIAGNOSIS — N039 Chronic nephritic syndrome with unspecified morphologic changes: Secondary | ICD-10-CM | POA: Diagnosis not present

## 2012-06-15 DIAGNOSIS — D509 Iron deficiency anemia, unspecified: Secondary | ICD-10-CM | POA: Diagnosis not present

## 2012-06-15 DIAGNOSIS — N186 End stage renal disease: Secondary | ICD-10-CM | POA: Diagnosis not present

## 2012-06-18 DIAGNOSIS — Z7721 Contact with and (suspected) exposure to potentially hazardous body fluids: Secondary | ICD-10-CM | POA: Diagnosis not present

## 2012-06-18 DIAGNOSIS — D509 Iron deficiency anemia, unspecified: Secondary | ICD-10-CM | POA: Diagnosis not present

## 2012-06-18 DIAGNOSIS — E039 Hypothyroidism, unspecified: Secondary | ICD-10-CM | POA: Diagnosis not present

## 2012-06-18 DIAGNOSIS — N2581 Secondary hyperparathyroidism of renal origin: Secondary | ICD-10-CM | POA: Diagnosis not present

## 2012-06-18 DIAGNOSIS — N186 End stage renal disease: Secondary | ICD-10-CM | POA: Diagnosis not present

## 2012-06-18 DIAGNOSIS — D631 Anemia in chronic kidney disease: Secondary | ICD-10-CM | POA: Diagnosis not present

## 2012-06-20 DIAGNOSIS — N2581 Secondary hyperparathyroidism of renal origin: Secondary | ICD-10-CM | POA: Diagnosis not present

## 2012-06-20 DIAGNOSIS — Z7721 Contact with and (suspected) exposure to potentially hazardous body fluids: Secondary | ICD-10-CM | POA: Diagnosis not present

## 2012-06-20 DIAGNOSIS — N186 End stage renal disease: Secondary | ICD-10-CM | POA: Diagnosis not present

## 2012-06-20 DIAGNOSIS — N039 Chronic nephritic syndrome with unspecified morphologic changes: Secondary | ICD-10-CM | POA: Diagnosis not present

## 2012-06-20 DIAGNOSIS — D509 Iron deficiency anemia, unspecified: Secondary | ICD-10-CM | POA: Diagnosis not present

## 2012-06-20 DIAGNOSIS — E039 Hypothyroidism, unspecified: Secondary | ICD-10-CM | POA: Diagnosis not present

## 2012-06-21 DIAGNOSIS — M79609 Pain in unspecified limb: Secondary | ICD-10-CM | POA: Diagnosis not present

## 2012-06-21 DIAGNOSIS — I70219 Atherosclerosis of native arteries of extremities with intermittent claudication, unspecified extremity: Secondary | ICD-10-CM | POA: Diagnosis not present

## 2012-06-22 DIAGNOSIS — Z7721 Contact with and (suspected) exposure to potentially hazardous body fluids: Secondary | ICD-10-CM | POA: Diagnosis not present

## 2012-06-22 DIAGNOSIS — D509 Iron deficiency anemia, unspecified: Secondary | ICD-10-CM | POA: Diagnosis not present

## 2012-06-22 DIAGNOSIS — N2581 Secondary hyperparathyroidism of renal origin: Secondary | ICD-10-CM | POA: Diagnosis not present

## 2012-06-22 DIAGNOSIS — D631 Anemia in chronic kidney disease: Secondary | ICD-10-CM | POA: Diagnosis not present

## 2012-06-22 DIAGNOSIS — N186 End stage renal disease: Secondary | ICD-10-CM | POA: Diagnosis not present

## 2012-06-22 DIAGNOSIS — E039 Hypothyroidism, unspecified: Secondary | ICD-10-CM | POA: Diagnosis not present

## 2012-06-25 DIAGNOSIS — N2581 Secondary hyperparathyroidism of renal origin: Secondary | ICD-10-CM | POA: Diagnosis not present

## 2012-06-25 DIAGNOSIS — N186 End stage renal disease: Secondary | ICD-10-CM | POA: Diagnosis not present

## 2012-06-25 DIAGNOSIS — E039 Hypothyroidism, unspecified: Secondary | ICD-10-CM | POA: Diagnosis not present

## 2012-06-25 DIAGNOSIS — N039 Chronic nephritic syndrome with unspecified morphologic changes: Secondary | ICD-10-CM | POA: Diagnosis not present

## 2012-06-25 DIAGNOSIS — Z7721 Contact with and (suspected) exposure to potentially hazardous body fluids: Secondary | ICD-10-CM | POA: Diagnosis not present

## 2012-06-25 DIAGNOSIS — D509 Iron deficiency anemia, unspecified: Secondary | ICD-10-CM | POA: Diagnosis not present

## 2012-06-28 DIAGNOSIS — D509 Iron deficiency anemia, unspecified: Secondary | ICD-10-CM | POA: Diagnosis not present

## 2012-06-28 DIAGNOSIS — D631 Anemia in chronic kidney disease: Secondary | ICD-10-CM | POA: Diagnosis not present

## 2012-06-28 DIAGNOSIS — N186 End stage renal disease: Secondary | ICD-10-CM | POA: Diagnosis not present

## 2012-06-28 DIAGNOSIS — M25559 Pain in unspecified hip: Secondary | ICD-10-CM | POA: Diagnosis not present

## 2012-06-28 DIAGNOSIS — E039 Hypothyroidism, unspecified: Secondary | ICD-10-CM | POA: Diagnosis not present

## 2012-06-28 DIAGNOSIS — N2581 Secondary hyperparathyroidism of renal origin: Secondary | ICD-10-CM | POA: Diagnosis not present

## 2012-06-28 DIAGNOSIS — Z7721 Contact with and (suspected) exposure to potentially hazardous body fluids: Secondary | ICD-10-CM | POA: Diagnosis not present

## 2012-06-29 DIAGNOSIS — N186 End stage renal disease: Secondary | ICD-10-CM | POA: Diagnosis not present

## 2012-06-30 DIAGNOSIS — I70269 Atherosclerosis of native arteries of extremities with gangrene, unspecified extremity: Secondary | ICD-10-CM | POA: Diagnosis not present

## 2012-06-30 DIAGNOSIS — R262 Difficulty in walking, not elsewhere classified: Secondary | ICD-10-CM | POA: Diagnosis not present

## 2012-06-30 DIAGNOSIS — E1165 Type 2 diabetes mellitus with hyperglycemia: Secondary | ICD-10-CM | POA: Diagnosis not present

## 2012-06-30 DIAGNOSIS — K5289 Other specified noninfective gastroenteritis and colitis: Secondary | ICD-10-CM | POA: Diagnosis not present

## 2012-06-30 DIAGNOSIS — E119 Type 2 diabetes mellitus without complications: Secondary | ICD-10-CM | POA: Diagnosis not present

## 2012-06-30 DIAGNOSIS — S7290XA Unspecified fracture of unspecified femur, initial encounter for closed fracture: Secondary | ICD-10-CM | POA: Diagnosis not present

## 2012-06-30 DIAGNOSIS — J698 Pneumonitis due to inhalation of other solids and liquids: Secondary | ICD-10-CM | POA: Diagnosis not present

## 2012-06-30 DIAGNOSIS — M6281 Muscle weakness (generalized): Secondary | ICD-10-CM | POA: Diagnosis not present

## 2012-06-30 DIAGNOSIS — I96 Gangrene, not elsewhere classified: Secondary | ICD-10-CM | POA: Diagnosis not present

## 2012-06-30 DIAGNOSIS — L8995 Pressure ulcer of unspecified site, unstageable: Secondary | ICD-10-CM | POA: Diagnosis not present

## 2012-06-30 DIAGNOSIS — R488 Other symbolic dysfunctions: Secondary | ICD-10-CM | POA: Diagnosis not present

## 2012-06-30 DIAGNOSIS — I739 Peripheral vascular disease, unspecified: Secondary | ICD-10-CM | POA: Diagnosis not present

## 2012-06-30 DIAGNOSIS — S72309A Unspecified fracture of shaft of unspecified femur, initial encounter for closed fracture: Secondary | ICD-10-CM | POA: Diagnosis not present

## 2012-06-30 DIAGNOSIS — L97309 Non-pressure chronic ulcer of unspecified ankle with unspecified severity: Secondary | ICD-10-CM | POA: Diagnosis not present

## 2012-06-30 DIAGNOSIS — N039 Chronic nephritic syndrome with unspecified morphologic changes: Secondary | ICD-10-CM | POA: Diagnosis not present

## 2012-06-30 DIAGNOSIS — D509 Iron deficiency anemia, unspecified: Secondary | ICD-10-CM | POA: Diagnosis not present

## 2012-06-30 DIAGNOSIS — L97909 Non-pressure chronic ulcer of unspecified part of unspecified lower leg with unspecified severity: Secondary | ICD-10-CM | POA: Diagnosis not present

## 2012-06-30 DIAGNOSIS — I120 Hypertensive chronic kidney disease with stage 5 chronic kidney disease or end stage renal disease: Secondary | ICD-10-CM | POA: Diagnosis not present

## 2012-06-30 DIAGNOSIS — T847XXA Infection and inflammatory reaction due to other internal orthopedic prosthetic devices, implants and grafts, initial encounter: Secondary | ICD-10-CM | POA: Diagnosis not present

## 2012-06-30 DIAGNOSIS — N185 Chronic kidney disease, stage 5: Secondary | ICD-10-CM | POA: Diagnosis not present

## 2012-06-30 DIAGNOSIS — D631 Anemia in chronic kidney disease: Secondary | ICD-10-CM | POA: Diagnosis not present

## 2012-06-30 DIAGNOSIS — L89609 Pressure ulcer of unspecified heel, unspecified stage: Secondary | ICD-10-CM | POA: Diagnosis not present

## 2012-06-30 DIAGNOSIS — N2581 Secondary hyperparathyroidism of renal origin: Secondary | ICD-10-CM | POA: Diagnosis not present

## 2012-06-30 DIAGNOSIS — N186 End stage renal disease: Secondary | ICD-10-CM | POA: Diagnosis not present

## 2012-06-30 DIAGNOSIS — S72453A Displaced supracondylar fracture without intracondylar extension of lower end of unspecified femur, initial encounter for closed fracture: Secondary | ICD-10-CM | POA: Diagnosis not present

## 2012-06-30 DIAGNOSIS — E1159 Type 2 diabetes mellitus with other circulatory complications: Secondary | ICD-10-CM | POA: Diagnosis not present

## 2012-06-30 DIAGNOSIS — J690 Pneumonitis due to inhalation of food and vomit: Secondary | ICD-10-CM | POA: Diagnosis not present

## 2012-06-30 DIAGNOSIS — N19 Unspecified kidney failure: Secondary | ICD-10-CM | POA: Diagnosis not present

## 2012-06-30 DIAGNOSIS — D649 Anemia, unspecified: Secondary | ICD-10-CM | POA: Diagnosis not present

## 2012-06-30 DIAGNOSIS — G9341 Metabolic encephalopathy: Secondary | ICD-10-CM | POA: Diagnosis not present

## 2012-07-02 DIAGNOSIS — L8995 Pressure ulcer of unspecified site, unstageable: Secondary | ICD-10-CM | POA: Diagnosis not present

## 2012-07-02 DIAGNOSIS — L89609 Pressure ulcer of unspecified heel, unspecified stage: Secondary | ICD-10-CM | POA: Diagnosis not present

## 2012-07-03 DIAGNOSIS — J698 Pneumonitis due to inhalation of other solids and liquids: Secondary | ICD-10-CM | POA: Diagnosis not present

## 2012-07-03 DIAGNOSIS — J690 Pneumonitis due to inhalation of food and vomit: Secondary | ICD-10-CM | POA: Diagnosis not present

## 2012-07-03 DIAGNOSIS — N186 End stage renal disease: Secondary | ICD-10-CM | POA: Diagnosis not present

## 2012-07-03 DIAGNOSIS — D509 Iron deficiency anemia, unspecified: Secondary | ICD-10-CM | POA: Diagnosis not present

## 2012-07-03 DIAGNOSIS — N2581 Secondary hyperparathyroidism of renal origin: Secondary | ICD-10-CM | POA: Diagnosis not present

## 2012-07-03 DIAGNOSIS — D631 Anemia in chronic kidney disease: Secondary | ICD-10-CM | POA: Diagnosis not present

## 2012-07-04 DIAGNOSIS — S7290XA Unspecified fracture of unspecified femur, initial encounter for closed fracture: Secondary | ICD-10-CM | POA: Diagnosis not present

## 2012-07-04 DIAGNOSIS — N19 Unspecified kidney failure: Secondary | ICD-10-CM | POA: Diagnosis not present

## 2012-07-04 DIAGNOSIS — D649 Anemia, unspecified: Secondary | ICD-10-CM | POA: Diagnosis not present

## 2012-07-04 DIAGNOSIS — S72309A Unspecified fracture of shaft of unspecified femur, initial encounter for closed fracture: Secondary | ICD-10-CM | POA: Diagnosis not present

## 2012-07-04 DIAGNOSIS — T847XXA Infection and inflammatory reaction due to other internal orthopedic prosthetic devices, implants and grafts, initial encounter: Secondary | ICD-10-CM | POA: Diagnosis not present

## 2012-07-05 DIAGNOSIS — D631 Anemia in chronic kidney disease: Secondary | ICD-10-CM | POA: Diagnosis not present

## 2012-07-05 DIAGNOSIS — N2581 Secondary hyperparathyroidism of renal origin: Secondary | ICD-10-CM | POA: Diagnosis not present

## 2012-07-05 DIAGNOSIS — D509 Iron deficiency anemia, unspecified: Secondary | ICD-10-CM | POA: Diagnosis not present

## 2012-07-05 DIAGNOSIS — J698 Pneumonitis due to inhalation of other solids and liquids: Secondary | ICD-10-CM | POA: Diagnosis not present

## 2012-07-05 DIAGNOSIS — J690 Pneumonitis due to inhalation of food and vomit: Secondary | ICD-10-CM | POA: Diagnosis not present

## 2012-07-05 DIAGNOSIS — N186 End stage renal disease: Secondary | ICD-10-CM | POA: Diagnosis not present

## 2012-07-07 DIAGNOSIS — N2581 Secondary hyperparathyroidism of renal origin: Secondary | ICD-10-CM | POA: Diagnosis not present

## 2012-07-07 DIAGNOSIS — D631 Anemia in chronic kidney disease: Secondary | ICD-10-CM | POA: Diagnosis not present

## 2012-07-07 DIAGNOSIS — D509 Iron deficiency anemia, unspecified: Secondary | ICD-10-CM | POA: Diagnosis not present

## 2012-07-07 DIAGNOSIS — N186 End stage renal disease: Secondary | ICD-10-CM | POA: Diagnosis not present

## 2012-07-07 DIAGNOSIS — J698 Pneumonitis due to inhalation of other solids and liquids: Secondary | ICD-10-CM | POA: Diagnosis not present

## 2012-07-07 DIAGNOSIS — J690 Pneumonitis due to inhalation of food and vomit: Secondary | ICD-10-CM | POA: Diagnosis not present

## 2012-07-09 ENCOUNTER — Other Ambulatory Visit: Payer: Self-pay

## 2012-07-09 DIAGNOSIS — L97309 Non-pressure chronic ulcer of unspecified ankle with unspecified severity: Secondary | ICD-10-CM | POA: Diagnosis not present

## 2012-07-09 DIAGNOSIS — I739 Peripheral vascular disease, unspecified: Secondary | ICD-10-CM

## 2012-07-09 DIAGNOSIS — E1129 Type 2 diabetes mellitus with other diabetic kidney complication: Secondary | ICD-10-CM | POA: Diagnosis not present

## 2012-07-09 DIAGNOSIS — L8995 Pressure ulcer of unspecified site, unstageable: Secondary | ICD-10-CM | POA: Diagnosis not present

## 2012-07-09 DIAGNOSIS — L98499 Non-pressure chronic ulcer of skin of other sites with unspecified severity: Secondary | ICD-10-CM

## 2012-07-09 DIAGNOSIS — L89609 Pressure ulcer of unspecified heel, unspecified stage: Secondary | ICD-10-CM | POA: Diagnosis not present

## 2012-07-09 DIAGNOSIS — R0989 Other specified symptoms and signs involving the circulatory and respiratory systems: Secondary | ICD-10-CM

## 2012-07-09 NOTE — Progress Notes (Signed)
Dr. Audrie Lia nurse called to refer pt. for multiple left ankle and foot wounds, and diminished pulses in left foot; requesting appt. ASAP.  Discussed w/ Dr. Myra Gianotti.  Recommended pt. be eval. ASAP this week; gave verbal order for ABI's and Left LE arterial duplex.  Notified Lauren @ Dr. Audrie Lia office of appt. On 07/11/12 at 8:30 AM.  Stated she will fax referral and today's office note.

## 2012-07-10 ENCOUNTER — Other Ambulatory Visit: Payer: Self-pay | Admitting: *Deleted

## 2012-07-10 ENCOUNTER — Encounter: Payer: Self-pay | Admitting: Vascular Surgery

## 2012-07-10 DIAGNOSIS — D509 Iron deficiency anemia, unspecified: Secondary | ICD-10-CM | POA: Diagnosis not present

## 2012-07-10 DIAGNOSIS — J690 Pneumonitis due to inhalation of food and vomit: Secondary | ICD-10-CM | POA: Diagnosis not present

## 2012-07-10 DIAGNOSIS — R0989 Other specified symptoms and signs involving the circulatory and respiratory systems: Secondary | ICD-10-CM

## 2012-07-10 DIAGNOSIS — L97909 Non-pressure chronic ulcer of unspecified part of unspecified lower leg with unspecified severity: Secondary | ICD-10-CM

## 2012-07-10 DIAGNOSIS — N186 End stage renal disease: Secondary | ICD-10-CM | POA: Diagnosis not present

## 2012-07-10 DIAGNOSIS — D631 Anemia in chronic kidney disease: Secondary | ICD-10-CM | POA: Diagnosis not present

## 2012-07-10 DIAGNOSIS — J698 Pneumonitis due to inhalation of other solids and liquids: Secondary | ICD-10-CM | POA: Diagnosis not present

## 2012-07-10 DIAGNOSIS — N2581 Secondary hyperparathyroidism of renal origin: Secondary | ICD-10-CM | POA: Diagnosis not present

## 2012-07-11 ENCOUNTER — Encounter (INDEPENDENT_AMBULATORY_CARE_PROVIDER_SITE_OTHER): Payer: Medicare Other | Admitting: *Deleted

## 2012-07-11 ENCOUNTER — Encounter: Payer: Self-pay | Admitting: Vascular Surgery

## 2012-07-11 ENCOUNTER — Ambulatory Visit (INDEPENDENT_AMBULATORY_CARE_PROVIDER_SITE_OTHER): Payer: Medicare Other | Admitting: Vascular Surgery

## 2012-07-11 VITALS — BP 146/65 | HR 94 | Temp 98.8°F | Ht 63.0 in | Wt 169.0 lb

## 2012-07-11 DIAGNOSIS — R0989 Other specified symptoms and signs involving the circulatory and respiratory systems: Secondary | ICD-10-CM

## 2012-07-11 DIAGNOSIS — I7025 Atherosclerosis of native arteries of other extremities with ulceration: Secondary | ICD-10-CM | POA: Insufficient documentation

## 2012-07-11 DIAGNOSIS — I739 Peripheral vascular disease, unspecified: Secondary | ICD-10-CM | POA: Diagnosis not present

## 2012-07-11 DIAGNOSIS — L98499 Non-pressure chronic ulcer of skin of other sites with unspecified severity: Secondary | ICD-10-CM

## 2012-07-11 DIAGNOSIS — L97909 Non-pressure chronic ulcer of unspecified part of unspecified lower leg with unspecified severity: Secondary | ICD-10-CM

## 2012-07-11 NOTE — Progress Notes (Signed)
[facility] [place]  Patient name: [name] MRN: [id number] DOB: 1945/10/05 Sex: female  REASON FOR CONSULT: nonhealing wounds of the left foot.  HPI: [name] is a 67 y.o. female who developed multiple nonhealing full-thickness wound of her left foot approximately 6-8 weeks ago. She does not remember any specific injury to the foot. Prior to developing these wounds she denied any history of claudication or rest pain. Her only complaint was some pain in her knees with ambulation.  Her past medical history is significant for insulin-dependent diabetes and dialysis-dependent chronic kidney disease. In addition she has a history of hypertension and hypercholesterolemia. She denies any history of myocardial infarction or history of congestive heart failure. She's had no angina. She's had a stress test in the remote past. She was home and is currently undergoing physical therapy and is ambulatory with some assistance. She dialyzes on Tuesdays Thursdays and Saturdays.  Past Medical History  Diagnosis Date  . ESRD on hemodialysis     MWF at [facility]. Started HD November 26, 2009. 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  Marland Kitchen COPD (chronic obstructive pulmonary disease)   . Diabetes mellitus   . Hypertension   . Thyroid disease   . Hyperlipidemia   . Bronchitis   . Leg pain   . 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 Peripheral vascular disease   . Hypothyroidism   . Arthritis     knee    History reviewed. No pertinent family history.  She denies any history of premature cardiovascular disease  SOCIAL HISTORY: History  Substance Use Topics  . Smoking status: Former Smoker -- 0.50 packs/day for 30 years    Types: Cigarettes    Quit date: 04/29/2012  . Smokeless tobacco: Never Used  . Alcohol Use: No   She quit smoking approximately 3 months ago. She had been smoking half a pack per day for many years.  Allergies   Allergen Reactions  . Contrast Media (Iodinated Diagnostic Agents) Itching    Current Outpatient Prescriptions  Medication Sig Dispense Refill  . amLODipine (NORVASC) 5 MG tablet Take 1 tablet (5 mg total) by mouth daily.      . calcium acetate (PHOSLO) 667 MG capsule Take 1 capsule (667 mg total) by mouth 2 (two) times daily with a meal.      . chlorpheniramine-HYDROcodone (TUSSIONEX) 10-8 MG/5ML LQCR Take 5 mLs by mouth every 12 (twelve) hours as needed (cough).  140 mL  0  . feeding supplement (PRO-STAT SUGAR FREE 64) LIQD Take 30 mLs by mouth 2 (two) times daily with breakfast and lunch.  900 mL    . fluticasone (FLONASE) 50 MCG/ACT nasal spray Place 2 sprays into the nose Daily.      . insulin aspart (NOVOLOG) 100 UNIT/ML injection Inject 0-15 Units into the skin every 4 (four) hours. CBG < 70: Drink juice; CBG 70 - 120: 0 units: CBG 121 - 150: 2 units; CBG 151 - 200: 3 units; CBG 201 - 250: 5 units; CBG 251 - 300: 8 units;CBG 301 - 350: 11 units; CBG 351 - 400: 15 units; CBG > 400 : 15 units and Call MD  1 vial    . levofloxacin (LEVAQUIN) 500 MG tablet Take 1 tablet (500 mg total) by mouth every other day. X 4 more doses      . levothyroxine (SYNTHROID, LEVOTHROID) 75 MCG tablet Take 75 mcg by  mouth daily.        . methocarbamol (ROBAXIN) 500 MG tablet Take 1 tablet (500 mg total) by mouth every 6 (six) hours as needed.  30 tablet  0  . metoCLOPramide (REGLAN) 5 MG tablet Take 1-2 tablets (5-10 mg total) by mouth every 8 (eight) hours as needed (if ondansetron (ZOFRAN) ineffective.).      Marland Kitchen multivitamin (RENA-VIT) TABS tablet Take 1 tablet by mouth daily.      . niacin (NIASPAN) 500 MG CR tablet Take 500 mg by mouth daily.       . Nutritional Supplements (FEEDING SUPPLEMENT, NEPRO CARB STEADY,) LIQD Take 237 mLs by mouth 2 (two) times daily between meals.      . ondansetron (ZOFRAN) 4 MG tablet Take 1 tablet (4 mg total) by mouth every 6 (six) hours as needed for nausea.  20 tablet    .  oxyCODONE (OXY IR/ROXICODONE) 5 MG immediate release tablet Take 1 tablet (5 mg total) by mouth every 4 (four) hours as needed.  30 tablet  0  . sodium chloride 0.9 % SOLN 100 mL with ferric gluconate 12.5 MG/ML SOLN 125 mg Inject 125 mg into the vein every Monday, Wednesday, and Friday with hemodialysis.      Marland Kitchen vancomycin (VANCOCIN) 50 mg/mL oral solution Take 10 mLs (500 mg total) by mouth every 6 (six) hours. X 14 days  100 mL  1   No current facility-administered medications for this visit.    REVIEW OF SYSTEMS: Arly.Keller ] denotes positive finding; [  ] denotes negative finding  CARDIOVASCULAR:  [ ]  chest pain   [ ]  chest pressure   [ ]  palpitations   [ ]  orthopnea   Arly.Keller ] dyspnea on exertion   [ ]  claudication   [ ]  rest pain   [ ]  DVT   [ ]  phlebitis PULMONARY:   [ ]  productive cough   [ ]  asthma   [ ]  wheezing NEUROLOGIC:   [ ]  weakness  [ ]  paresthesias  [ ]  aphasia  [ ]  amaurosis  [ ]  dizziness HEMATOLOGIC:   [ ]  bleeding problems   [ ]  clotting disorders MUSCULOSKELETAL:  Arly.Keller ] joint pain   [ ]  joint swelling [ ]  leg swelling GASTROINTESTINAL: [ ]   blood in stool  [ ]   hematemesis GENITOURINARY:  [ ]   dysuria  [ ]   hematuria PSYCHIATRIC:  [ ]  history of major depression INTEGUMENTARY:  [ ]  rashes  Arly.Keller ] ulcers CONSTITUTIONAL:  [ ]  fever   [ ]  chills  PHYSICAL EXAM: Filed Vitals:   07/11/12 0945  BP: 146/65  Pulse: 94  Temp: 98.8 F (37.1 C)  TempSrc: Oral  Height: 5\' 3"  (1.6 m)  Weight: 169 lb (76.658 kg)  SpO2: 98%   Body mass index is 29.94 kg/(m^2). GENERAL: The patient is a well-nourished female, in no acute distress. The vital signs are documented above. CARDIOVASCULAR: There is a regular rate and rhythm. She has a left carotid bruit. She has a palpable left femoral pulse with diminished right femoral pulse. She has a palpable right radial pulse. I cannot palpate a left radial pulse. Cannot palpate popliteal or pedal pulses. He has moderate swelling of the left lower extremity  to PULMONARY: There is good air exchange bilaterally without wheezing or rales. ABDOMEN: Soft and non-tender with normal pitched bowel sounds.  MUSCULOSKELETAL: There are no major deformities or cyanosis. NEUROLOGIC: No focal weakness or paresthesias are detected. SKIN: he has a  full thickness wound on the dorsum of her foot which measures 10 cm x 8 cm. She has a full thickness wound the lateral aspect of her left foot over the fifth metatarsal. She has a full thickness heel decubitus. PSYCHIATRIC: The patient has a normal affect.  DATA:  Have independently interpreted her arterial Doppler study which shows an ABI of 42% on the right and 32% on the left. She has a monophasic Doppler signal and dorsalis pedis and posterior tibial position positions bilaterally. I've also independently interpreted her duplex scan of her left lower extremity. She has a superficial femoral artery occlusion over a long segment.  MEDICAL ISSUES:  Atherosclerosis of native arteries of the extremities with ulceration(440.23) This patient has multiple full thickness wounds of her left foot. She has a wound on the dorsum of the foot it measures 10 cm x 8 cm. She has a second full thickness wound on the lateral surface of the foot overlying the fifth metatarsal. She has a large full thickness heel decubitus. I think even if she were a candidate for revascularization that the chance of limb salvage is very small even with a successful bypass. Given the extent of the wounds I think he would be difficult to save the foot. In addition given her diabetes and dialysis-dependent chronic renal insufficiency certainly an extensive bypass would be associated with significant risk. Based on her exam and Doppler study she has evidence of multilevel arterial occlusive disease and would require a femoral tibial bypass if there were an option. For this reason, at this point I have not recommended arteriography as I do not think that even if she  were a candidate for revascularization, and even if this were successful, I think there is very little chance for limb salvage. However, I have explained that I would certainly be willing to proceed with arteriography if she would feel better about knowing what her options were for revascularization. I've explained it really the only risk with arteriography is a 1-2% risk of bleeding or arterial injury associated with femoral artery catheterization. My recommendation would be primary amputation. I'll be happy to see her back if any new vascular issues arise appear   Jojuan Champney S [facility] [place] Beeper: [phone]

## 2012-07-11 NOTE — Assessment & Plan Note (Signed)
This patient has multiple full thickness wounds of her left foot. She has a wound on the dorsum of the foot it measures 10 cm x 8 cm. She has a second full thickness wound on the lateral surface of the foot overlying the fifth metatarsal. She has a large full thickness heel decubitus. I think even if she were a candidate for revascularization that the chance of limb salvage is very small even with a successful bypass. Given the extent of the wounds I think he would be difficult to save the foot. In addition given her diabetes and dialysis-dependent chronic renal insufficiency certainly an extensive bypass would be associated with significant risk. Based on her exam and Doppler study she has evidence of multilevel arterial occlusive disease and would require a femoral tibial bypass if there were an option. For this reason, at this point I have not recommended arteriography as I do not think that even if she were a candidate for revascularization, and even if this were successful, I think there is very little chance for limb salvage. However, I have explained that I would certainly be willing to proceed with arteriography if she would feel better about knowing what her options were for revascularization. I've explained it really the only risk with arteriography is a 1-2% risk of bleeding or arterial injury associated with femoral artery catheterization. My recommendation would be primary amputation. I'll be happy to see her back if any new vascular issues arise appear

## 2012-07-13 DIAGNOSIS — J690 Pneumonitis due to inhalation of food and vomit: Secondary | ICD-10-CM | POA: Diagnosis not present

## 2012-07-13 DIAGNOSIS — N2581 Secondary hyperparathyroidism of renal origin: Secondary | ICD-10-CM | POA: Diagnosis not present

## 2012-07-13 DIAGNOSIS — D631 Anemia in chronic kidney disease: Secondary | ICD-10-CM | POA: Diagnosis not present

## 2012-07-13 DIAGNOSIS — N186 End stage renal disease: Secondary | ICD-10-CM | POA: Diagnosis not present

## 2012-07-13 DIAGNOSIS — D509 Iron deficiency anemia, unspecified: Secondary | ICD-10-CM | POA: Diagnosis not present

## 2012-07-13 DIAGNOSIS — J698 Pneumonitis due to inhalation of other solids and liquids: Secondary | ICD-10-CM | POA: Diagnosis not present

## 2012-07-14 DIAGNOSIS — N2581 Secondary hyperparathyroidism of renal origin: Secondary | ICD-10-CM | POA: Diagnosis not present

## 2012-07-14 DIAGNOSIS — J698 Pneumonitis due to inhalation of other solids and liquids: Secondary | ICD-10-CM | POA: Diagnosis not present

## 2012-07-14 DIAGNOSIS — D509 Iron deficiency anemia, unspecified: Secondary | ICD-10-CM | POA: Diagnosis not present

## 2012-07-14 DIAGNOSIS — J690 Pneumonitis due to inhalation of food and vomit: Secondary | ICD-10-CM | POA: Diagnosis not present

## 2012-07-14 DIAGNOSIS — D631 Anemia in chronic kidney disease: Secondary | ICD-10-CM | POA: Diagnosis not present

## 2012-07-14 DIAGNOSIS — N186 End stage renal disease: Secondary | ICD-10-CM | POA: Diagnosis not present

## 2012-07-16 DIAGNOSIS — L8995 Pressure ulcer of unspecified site, unstageable: Secondary | ICD-10-CM | POA: Diagnosis not present

## 2012-07-16 DIAGNOSIS — L89609 Pressure ulcer of unspecified heel, unspecified stage: Secondary | ICD-10-CM | POA: Diagnosis not present

## 2012-07-17 DIAGNOSIS — N2581 Secondary hyperparathyroidism of renal origin: Secondary | ICD-10-CM | POA: Diagnosis not present

## 2012-07-17 DIAGNOSIS — D509 Iron deficiency anemia, unspecified: Secondary | ICD-10-CM | POA: Diagnosis not present

## 2012-07-17 DIAGNOSIS — D631 Anemia in chronic kidney disease: Secondary | ICD-10-CM | POA: Diagnosis not present

## 2012-07-17 DIAGNOSIS — J698 Pneumonitis due to inhalation of other solids and liquids: Secondary | ICD-10-CM | POA: Diagnosis not present

## 2012-07-17 DIAGNOSIS — N186 End stage renal disease: Secondary | ICD-10-CM | POA: Diagnosis not present

## 2012-07-17 DIAGNOSIS — J690 Pneumonitis due to inhalation of food and vomit: Secondary | ICD-10-CM | POA: Diagnosis not present

## 2012-07-19 DIAGNOSIS — J690 Pneumonitis due to inhalation of food and vomit: Secondary | ICD-10-CM | POA: Diagnosis not present

## 2012-07-19 DIAGNOSIS — N186 End stage renal disease: Secondary | ICD-10-CM | POA: Diagnosis not present

## 2012-07-19 DIAGNOSIS — N2581 Secondary hyperparathyroidism of renal origin: Secondary | ICD-10-CM | POA: Diagnosis not present

## 2012-07-19 DIAGNOSIS — N039 Chronic nephritic syndrome with unspecified morphologic changes: Secondary | ICD-10-CM | POA: Diagnosis not present

## 2012-07-19 DIAGNOSIS — D509 Iron deficiency anemia, unspecified: Secondary | ICD-10-CM | POA: Diagnosis not present

## 2012-07-19 DIAGNOSIS — D631 Anemia in chronic kidney disease: Secondary | ICD-10-CM | POA: Diagnosis not present

## 2012-07-19 DIAGNOSIS — J698 Pneumonitis due to inhalation of other solids and liquids: Secondary | ICD-10-CM | POA: Diagnosis not present

## 2012-07-21 DIAGNOSIS — N186 End stage renal disease: Secondary | ICD-10-CM | POA: Diagnosis not present

## 2012-07-21 DIAGNOSIS — N2581 Secondary hyperparathyroidism of renal origin: Secondary | ICD-10-CM | POA: Diagnosis not present

## 2012-07-21 DIAGNOSIS — D631 Anemia in chronic kidney disease: Secondary | ICD-10-CM | POA: Diagnosis not present

## 2012-07-21 DIAGNOSIS — J698 Pneumonitis due to inhalation of other solids and liquids: Secondary | ICD-10-CM | POA: Diagnosis not present

## 2012-07-21 DIAGNOSIS — J690 Pneumonitis due to inhalation of food and vomit: Secondary | ICD-10-CM | POA: Diagnosis not present

## 2012-07-21 DIAGNOSIS — D509 Iron deficiency anemia, unspecified: Secondary | ICD-10-CM | POA: Diagnosis not present

## 2012-07-23 DIAGNOSIS — E1129 Type 2 diabetes mellitus with other diabetic kidney complication: Secondary | ICD-10-CM | POA: Diagnosis not present

## 2012-07-23 DIAGNOSIS — L89609 Pressure ulcer of unspecified heel, unspecified stage: Secondary | ICD-10-CM | POA: Diagnosis not present

## 2012-07-23 DIAGNOSIS — I70269 Atherosclerosis of native arteries of extremities with gangrene, unspecified extremity: Secondary | ICD-10-CM | POA: Diagnosis not present

## 2012-07-23 DIAGNOSIS — L8995 Pressure ulcer of unspecified site, unstageable: Secondary | ICD-10-CM | POA: Diagnosis not present

## 2012-07-24 ENCOUNTER — Other Ambulatory Visit (HOSPITAL_COMMUNITY): Payer: Self-pay | Admitting: Orthopedic Surgery

## 2012-07-24 DIAGNOSIS — N039 Chronic nephritic syndrome with unspecified morphologic changes: Secondary | ICD-10-CM | POA: Diagnosis not present

## 2012-07-24 DIAGNOSIS — D509 Iron deficiency anemia, unspecified: Secondary | ICD-10-CM | POA: Diagnosis not present

## 2012-07-24 DIAGNOSIS — J698 Pneumonitis due to inhalation of other solids and liquids: Secondary | ICD-10-CM | POA: Diagnosis not present

## 2012-07-24 DIAGNOSIS — N2581 Secondary hyperparathyroidism of renal origin: Secondary | ICD-10-CM | POA: Diagnosis not present

## 2012-07-24 DIAGNOSIS — J690 Pneumonitis due to inhalation of food and vomit: Secondary | ICD-10-CM | POA: Diagnosis not present

## 2012-07-24 DIAGNOSIS — N186 End stage renal disease: Secondary | ICD-10-CM | POA: Diagnosis not present

## 2012-07-24 NOTE — Progress Notes (Signed)
Requested orders to be put into epic-spoke with Cordelia Pen

## 2012-07-24 NOTE — Progress Notes (Signed)
Unable to reach pt on phone, I left voice message with instructions.

## 2012-07-25 ENCOUNTER — Inpatient Hospital Stay (HOSPITAL_COMMUNITY)
Admission: RE | Admit: 2012-07-25 | Discharge: 2012-07-27 | DRG: 239 | Disposition: A | Discharge status: Skilled Nursing Facility | Payer: Medicare Other | Source: Ambulatory Visit | Attending: Orthopedic Surgery | Admitting: Orthopedic Surgery

## 2012-07-25 ENCOUNTER — Encounter (HOSPITAL_COMMUNITY)
Admission: RE | Disposition: A | Discharge status: Skilled Nursing Facility | Payer: Self-pay | Source: Ambulatory Visit | Attending: Orthopedic Surgery

## 2012-07-25 ENCOUNTER — Encounter (HOSPITAL_COMMUNITY): Payer: Self-pay | Admitting: Anesthesiology

## 2012-07-25 ENCOUNTER — Encounter (HOSPITAL_COMMUNITY): Payer: Self-pay | Admitting: Pharmacy Technician

## 2012-07-25 ENCOUNTER — Inpatient Hospital Stay (HOSPITAL_COMMUNITY): Payer: Medicare Other | Admitting: Anesthesiology

## 2012-07-25 DIAGNOSIS — N2581 Secondary hyperparathyroidism of renal origin: Secondary | ICD-10-CM | POA: Diagnosis present

## 2012-07-25 DIAGNOSIS — E1159 Type 2 diabetes mellitus with other circulatory complications: Secondary | ICD-10-CM | POA: Diagnosis not present

## 2012-07-25 DIAGNOSIS — E1165 Type 2 diabetes mellitus with hyperglycemia: Secondary | ICD-10-CM | POA: Diagnosis present

## 2012-07-25 DIAGNOSIS — Z79899 Other long term (current) drug therapy: Secondary | ICD-10-CM

## 2012-07-25 DIAGNOSIS — M1710 Unilateral primary osteoarthritis, unspecified knee: Secondary | ICD-10-CM | POA: Diagnosis present

## 2012-07-25 DIAGNOSIS — R293 Abnormal posture: Secondary | ICD-10-CM | POA: Diagnosis not present

## 2012-07-25 DIAGNOSIS — Z992 Dependence on renal dialysis: Secondary | ICD-10-CM

## 2012-07-25 DIAGNOSIS — I120 Hypertensive chronic kidney disease with stage 5 chronic kidney disease or end stage renal disease: Secondary | ICD-10-CM | POA: Diagnosis present

## 2012-07-25 DIAGNOSIS — Z794 Long term (current) use of insulin: Secondary | ICD-10-CM | POA: Diagnosis not present

## 2012-07-25 DIAGNOSIS — D649 Anemia, unspecified: Secondary | ICD-10-CM | POA: Diagnosis present

## 2012-07-25 DIAGNOSIS — I96 Gangrene, not elsewhere classified: Secondary | ICD-10-CM | POA: Diagnosis not present

## 2012-07-25 DIAGNOSIS — M6281 Muscle weakness (generalized): Secondary | ICD-10-CM | POA: Diagnosis not present

## 2012-07-25 DIAGNOSIS — N186 End stage renal disease: Secondary | ICD-10-CM | POA: Diagnosis present

## 2012-07-25 DIAGNOSIS — IMO0002 Reserved for concepts with insufficient information to code with codable children: Secondary | ICD-10-CM | POA: Diagnosis present

## 2012-07-25 DIAGNOSIS — S72453A Displaced supracondylar fracture without intracondylar extension of lower end of unspecified femur, initial encounter for closed fracture: Secondary | ICD-10-CM | POA: Diagnosis not present

## 2012-07-25 DIAGNOSIS — E1129 Type 2 diabetes mellitus with other diabetic kidney complication: Secondary | ICD-10-CM | POA: Diagnosis not present

## 2012-07-25 DIAGNOSIS — E785 Hyperlipidemia, unspecified: Secondary | ICD-10-CM | POA: Diagnosis present

## 2012-07-25 DIAGNOSIS — S88119A Complete traumatic amputation at level between knee and ankle, unspecified lower leg, initial encounter: Secondary | ICD-10-CM | POA: Diagnosis not present

## 2012-07-25 DIAGNOSIS — I739 Peripheral vascular disease, unspecified: Secondary | ICD-10-CM | POA: Diagnosis present

## 2012-07-25 DIAGNOSIS — I798 Other disorders of arteries, arterioles and capillaries in diseases classified elsewhere: Secondary | ICD-10-CM | POA: Diagnosis present

## 2012-07-25 DIAGNOSIS — E039 Hypothyroidism, unspecified: Secondary | ICD-10-CM | POA: Diagnosis present

## 2012-07-25 DIAGNOSIS — R279 Unspecified lack of coordination: Secondary | ICD-10-CM | POA: Diagnosis not present

## 2012-07-25 DIAGNOSIS — D631 Anemia in chronic kidney disease: Secondary | ICD-10-CM | POA: Diagnosis not present

## 2012-07-25 DIAGNOSIS — I10 Essential (primary) hypertension: Secondary | ICD-10-CM | POA: Diagnosis not present

## 2012-07-25 DIAGNOSIS — L97309 Non-pressure chronic ulcer of unspecified ankle with unspecified severity: Secondary | ICD-10-CM | POA: Diagnosis not present

## 2012-07-25 DIAGNOSIS — R262 Difficulty in walking, not elsewhere classified: Secondary | ICD-10-CM | POA: Diagnosis not present

## 2012-07-25 DIAGNOSIS — Z87891 Personal history of nicotine dependence: Secondary | ICD-10-CM

## 2012-07-25 DIAGNOSIS — L97909 Non-pressure chronic ulcer of unspecified part of unspecified lower leg with unspecified severity: Secondary | ICD-10-CM | POA: Diagnosis not present

## 2012-07-25 DIAGNOSIS — I70269 Atherosclerosis of native arteries of extremities with gangrene, unspecified extremity: Secondary | ICD-10-CM

## 2012-07-25 DIAGNOSIS — R488 Other symbolic dysfunctions: Secondary | ICD-10-CM | POA: Diagnosis not present

## 2012-07-25 DIAGNOSIS — J449 Chronic obstructive pulmonary disease, unspecified: Secondary | ICD-10-CM | POA: Diagnosis present

## 2012-07-25 DIAGNOSIS — J4489 Other specified chronic obstructive pulmonary disease: Secondary | ICD-10-CM | POA: Diagnosis present

## 2012-07-25 DIAGNOSIS — E118 Type 2 diabetes mellitus with unspecified complications: Secondary | ICD-10-CM

## 2012-07-25 HISTORY — PX: AMPUTATION: SHX166

## 2012-07-25 LAB — CBC
HCT: 32.3 % — ABNORMAL LOW (ref 36.0–46.0)
Hemoglobin: 9.6 g/dL — ABNORMAL LOW (ref 12.0–15.0)
MCH: 25.3 pg — ABNORMAL LOW (ref 26.0–34.0)
MCHC: 29.7 g/dL — ABNORMAL LOW (ref 30.0–36.0)
MCV: 85 fL (ref 78.0–100.0)

## 2012-07-25 LAB — COMPREHENSIVE METABOLIC PANEL
ALT: 9 U/L (ref 0–35)
AST: 32 U/L (ref 0–37)
Albumin: 2.3 g/dL — ABNORMAL LOW (ref 3.5–5.2)
Calcium: 9.4 mg/dL (ref 8.4–10.5)
Chloride: 96 mEq/L (ref 96–112)
Creatinine, Ser: 4.09 mg/dL — ABNORMAL HIGH (ref 0.50–1.10)
Sodium: 134 mEq/L — ABNORMAL LOW (ref 135–145)

## 2012-07-25 LAB — GLUCOSE, CAPILLARY: Glucose-Capillary: 89 mg/dL (ref 70–99)

## 2012-07-25 LAB — SURGICAL PCR SCREEN: MRSA, PCR: NEGATIVE

## 2012-07-25 LAB — PROTIME-INR: INR: 1.04 (ref 0.00–1.49)

## 2012-07-25 LAB — APTT: aPTT: 32 seconds (ref 24–37)

## 2012-07-25 SURGERY — AMPUTATION BELOW KNEE
Anesthesia: General | Site: Leg Lower | Laterality: Left | Wound class: Dirty or Infected

## 2012-07-25 MED ORDER — ONDANSETRON HCL 4 MG/2ML IJ SOLN
INTRAMUSCULAR | Status: DC | PRN
  Administered 2012-07-25: 4 mg via INTRAVENOUS

## 2012-07-25 MED ORDER — SODIUM CHLORIDE 0.9 % IV SOLN: INTRAVENOUS | Status: DC

## 2012-07-25 MED ORDER — CEFAZOLIN SODIUM-DEXTROSE 2-3 GM-% IV SOLR
2.0000 g | INTRAVENOUS | Status: AC
  Administered 2012-07-25: 2 g via INTRAVENOUS

## 2012-07-25 MED ORDER — ONDANSETRON HCL 4 MG PO TABS: 4.0000 mg | ORAL_TABLET | Freq: Four times a day (QID) | ORAL | Status: DC | PRN

## 2012-07-25 MED ORDER — HYDROCODONE-ACETAMINOPHEN 10-325 MG PO TABS
1.0000 | ORAL_TABLET | ORAL | Status: DC | PRN
  Administered 2012-07-25 – 2012-07-27 (×5): 2 via ORAL
  Filled 2012-07-25 (×5): qty 2

## 2012-07-25 MED ORDER — MIDAZOLAM HCL 2 MG/2ML IJ SOLN: 1.0000 mg | INTRAMUSCULAR | Status: DC | PRN

## 2012-07-25 MED ORDER — DARBEPOETIN ALFA-POLYSORBATE 200 MCG/0.4ML IJ SOLN
200.0000 ug | INTRAMUSCULAR | Status: DC
  Filled 2012-07-25: qty 0.4

## 2012-07-25 MED ORDER — HYDROMORPHONE HCL PF 1 MG/ML IJ SOLN: 0.5000 mg | INTRAMUSCULAR | Status: DC | PRN

## 2012-07-25 MED ORDER — CEFAZOLIN SODIUM-DEXTROSE 2-3 GM-% IV SOLR
INTRAVENOUS | Status: AC
  Filled 2012-07-25: qty 50

## 2012-07-25 MED ORDER — NEPRO/CARBSTEADY PO LIQD
237.0000 mL | Freq: Two times a day (BID) | ORAL | Status: DC
  Administered 2012-07-25: 237 mL via ORAL
  Filled 2012-07-25 (×7): qty 237

## 2012-07-25 MED ORDER — OXYCODONE HCL 5 MG/5ML PO SOLN: 5.0000 mg | Freq: Once | ORAL | Status: DC | PRN

## 2012-07-25 MED ORDER — HYDROMORPHONE HCL PF 1 MG/ML IJ SOLN
INTRAMUSCULAR | Status: AC
Start: 2012-07-25 — End: 2012-07-25
  Filled 2012-07-25: qty 1

## 2012-07-25 MED ORDER — CEFAZOLIN SODIUM-DEXTROSE 2-3 GM-% IV SOLR
2.0000 g | Freq: Four times a day (QID) | INTRAVENOUS | Status: AC
  Administered 2012-07-25 – 2012-07-26 (×3): 2 g via INTRAVENOUS
  Filled 2012-07-25 (×4): qty 50

## 2012-07-25 MED ORDER — FENTANYL CITRATE 0.05 MG/ML IJ SOLN
INTRAMUSCULAR | Status: DC | PRN
  Administered 2012-07-25: 50 ug via INTRAVENOUS
  Administered 2012-07-25: 25 ug via INTRAVENOUS
  Administered 2012-07-25: 50 ug via INTRAVENOUS
  Administered 2012-07-25: 25 ug via INTRAVENOUS

## 2012-07-25 MED ORDER — NIACIN ER 500 MG PO CPCR
500.0000 mg | ORAL_CAPSULE | Freq: Every day | ORAL | Status: DC
Start: 2012-07-25 — End: 2012-07-27
  Administered 2012-07-25 – 2012-07-27 (×3): 500 mg via ORAL
  Filled 2012-07-25 (×4): qty 1

## 2012-07-25 MED ORDER — HYDROMORPHONE HCL PF 1 MG/ML IJ SOLN
0.2500 mg | INTRAMUSCULAR | Status: DC | PRN
  Administered 2012-07-25 (×4): 0.5 mg via INTRAVENOUS

## 2012-07-25 MED ORDER — METOCLOPRAMIDE HCL 10 MG PO TABS: 5.0000 mg | ORAL_TABLET | Freq: Three times a day (TID) | ORAL | Status: DC | PRN

## 2012-07-25 MED ORDER — SODIUM CHLORIDE 0.9 % IV SOLN
INTRAVENOUS | Status: DC | PRN
  Administered 2012-07-25: 08:00:00 via INTRAVENOUS

## 2012-07-25 MED ORDER — CALCIUM ACETATE 667 MG PO CAPS
667.0000 mg | ORAL_CAPSULE | Freq: Two times a day (BID) | ORAL | Status: DC
  Administered 2012-07-25 – 2012-07-27 (×5): 667 mg via ORAL
  Filled 2012-07-25 (×6): qty 1

## 2012-07-25 MED ORDER — 0.9 % SODIUM CHLORIDE (POUR BTL) OPTIME
TOPICAL | Status: DC | PRN
  Administered 2012-07-25: 1000 mL

## 2012-07-25 MED ORDER — PROPOFOL 10 MG/ML IV BOLUS
INTRAVENOUS | Status: DC | PRN
  Administered 2012-07-25: 50 mg via INTRAVENOUS
  Administered 2012-07-25: 140 mg via INTRAVENOUS

## 2012-07-25 MED ORDER — OXYCODONE HCL 5 MG PO TABS: 5.0000 mg | ORAL_TABLET | Freq: Once | ORAL | Status: DC | PRN

## 2012-07-25 MED ORDER — METOCLOPRAMIDE HCL 5 MG/ML IJ SOLN
5.0000 mg | Freq: Three times a day (TID) | INTRAMUSCULAR | Status: DC | PRN
  Administered 2012-07-26: 10 mg via INTRAVENOUS
  Filled 2012-07-25: qty 2

## 2012-07-25 MED ORDER — INSULIN ASPART 100 UNIT/ML ~~LOC~~ SOLN
0.0000 [IU] | Freq: Three times a day (TID) | SUBCUTANEOUS | Status: DC
  Administered 2012-07-25: 5 [IU] via SUBCUTANEOUS
  Administered 2012-07-26 (×2): 2 [IU] via SUBCUTANEOUS
  Administered 2012-07-27: 5 [IU] via SUBCUTANEOUS

## 2012-07-25 MED ORDER — MUPIROCIN 2 % EX OINT
TOPICAL_OINTMENT | CUTANEOUS | Status: AC
  Administered 2012-07-25: 08:00:00
  Filled 2012-07-25: qty 22

## 2012-07-25 MED ORDER — INSULIN GLARGINE 100 UNIT/ML ~~LOC~~ SOLN
10.0000 [IU] | Freq: Every day | SUBCUTANEOUS | Status: DC
  Administered 2012-07-25: 10 [IU] via SUBCUTANEOUS

## 2012-07-25 MED ORDER — INSULIN ASPART 100 UNIT/ML ~~LOC~~ SOLN
4.0000 [IU] | Freq: Three times a day (TID) | SUBCUTANEOUS | Status: DC
  Administered 2012-07-25 – 2012-07-27 (×3): 4 [IU] via SUBCUTANEOUS

## 2012-07-25 MED ORDER — PROMETHAZINE HCL 25 MG/ML IJ SOLN: 6.2500 mg | INTRAMUSCULAR | Status: DC | PRN

## 2012-07-25 MED ORDER — HYDROMORPHONE HCL PF 1 MG/ML IJ SOLN
INTRAMUSCULAR | Status: AC
  Filled 2012-07-25: qty 1

## 2012-07-25 MED ORDER — ONDANSETRON HCL 4 MG/2ML IJ SOLN
4.0000 mg | Freq: Four times a day (QID) | INTRAMUSCULAR | Status: DC | PRN
  Administered 2012-07-26: 4 mg via INTRAVENOUS
  Filled 2012-07-25: qty 2

## 2012-07-25 MED ORDER — METHOCARBAMOL 500 MG PO TABS
500.0000 mg | ORAL_TABLET | Freq: Four times a day (QID) | ORAL | Status: DC | PRN
  Administered 2012-07-25 – 2012-07-27 (×2): 500 mg via ORAL
  Filled 2012-07-25 (×2): qty 1

## 2012-07-25 MED ORDER — ASPIRIN EC 325 MG PO TBEC
325.0000 mg | DELAYED_RELEASE_TABLET | Freq: Every day | ORAL | Status: DC
  Administered 2012-07-25 – 2012-07-27 (×3): 325 mg via ORAL
  Filled 2012-07-25 (×3): qty 1

## 2012-07-25 MED ORDER — FLUTICASONE PROPIONATE 50 MCG/ACT NA SUSP
2.0000 | Freq: Every day | NASAL | Status: DC
  Administered 2012-07-27: 2 via NASAL
  Filled 2012-07-25: qty 16

## 2012-07-25 MED ORDER — LEVOTHYROXINE SODIUM 75 MCG PO TABS
75.0000 ug | ORAL_TABLET | Freq: Every day | ORAL | Status: DC
  Administered 2012-07-26 – 2012-07-27 (×2): 75 ug via ORAL
  Filled 2012-07-25 (×3): qty 1

## 2012-07-25 MED ORDER — DOXERCALCIFEROL 4 MCG/2ML IV SOLN
1.0000 ug | INTRAVENOUS | Status: DC
  Filled 2012-07-25: qty 2

## 2012-07-25 MED ORDER — PRO-STAT SUGAR FREE PO LIQD
30.0000 mL | Freq: Two times a day (BID) | ORAL | Status: DC
  Filled 2012-07-25 (×5): qty 30

## 2012-07-25 MED ORDER — FENTANYL CITRATE 0.05 MG/ML IJ SOLN: 50.0000 ug | Freq: Once | INTRAMUSCULAR | Status: DC

## 2012-07-25 MED ORDER — MUPIROCIN 2 % EX OINT: TOPICAL_OINTMENT | Freq: Two times a day (BID) | CUTANEOUS | Status: DC

## 2012-07-25 MED ORDER — EPHEDRINE SULFATE 50 MG/ML IJ SOLN
INTRAMUSCULAR | Status: DC | PRN
  Administered 2012-07-25: 10 mg via INTRAVENOUS

## 2012-07-25 MED ORDER — AMLODIPINE BESYLATE 5 MG PO TABS
5.0000 mg | ORAL_TABLET | Freq: Every day | ORAL | Status: DC
  Administered 2012-07-25 – 2012-07-27 (×3): 5 mg via ORAL
  Filled 2012-07-25 (×3): qty 1

## 2012-07-25 MED ORDER — MIDAZOLAM HCL 5 MG/5ML IJ SOLN
INTRAMUSCULAR | Status: DC | PRN
  Administered 2012-07-25: 2 mg via INTRAVENOUS

## 2012-07-25 MED ORDER — OXYCODONE HCL 5 MG PO TABS
5.0000 mg | ORAL_TABLET | ORAL | Status: DC | PRN
  Filled 2012-07-25: qty 2

## 2012-07-25 SURGICAL SUPPLY — 44 items
BANDAGE ESMARK 6X9 LF (GAUZE/BANDAGES/DRESSINGS) ×1 IMPLANT
BANDAGE GAUZE ELAST BULKY 4 IN (GAUZE/BANDAGES/DRESSINGS) ×4 IMPLANT
BLADE SAW RECIP 87.9 MT (BLADE) ×2 IMPLANT
BLADE SURG 21 STRL SS (BLADE) ×2 IMPLANT
BNDG COHESIVE 6X5 TAN STRL LF (GAUZE/BANDAGES/DRESSINGS) ×4 IMPLANT
BNDG ESMARK 6X9 LF (GAUZE/BANDAGES/DRESSINGS) ×2
CLOTH BEACON ORANGE TIMEOUT ST (SAFETY) ×2 IMPLANT
COVER SURGICAL LIGHT HANDLE (MISCELLANEOUS) ×2 IMPLANT
CUFF TOURNIQUET SINGLE 34IN LL (TOURNIQUET CUFF) IMPLANT
CUFF TOURNIQUET SINGLE 44IN (TOURNIQUET CUFF) IMPLANT
DRAIN PENROSE 1/2X12 LTX STRL (WOUND CARE) IMPLANT
DRAPE EXTREMITY T 121X128X90 (DRAPE) ×2 IMPLANT
DRAPE PROXIMA HALF (DRAPES) ×4 IMPLANT
DRAPE U-SHAPE 47X51 STRL (DRAPES) ×4 IMPLANT
DRSG ADAPTIC 3X8 NADH LF (GAUZE/BANDAGES/DRESSINGS) ×2 IMPLANT
DRSG PAD ABDOMINAL 8X10 ST (GAUZE/BANDAGES/DRESSINGS) ×2 IMPLANT
DURAPREP 26ML APPLICATOR (WOUND CARE) ×2 IMPLANT
ELECT REM PT RETURN 9FT ADLT (ELECTROSURGICAL) ×2
ELECTRODE REM PT RTRN 9FT ADLT (ELECTROSURGICAL) ×1 IMPLANT
EVACUATOR 1/8 PVC DRAIN (DRAIN) IMPLANT
GLOVE BIOGEL PI IND STRL 9 (GLOVE) ×1 IMPLANT
GLOVE BIOGEL PI INDICATOR 9 (GLOVE) ×1
GLOVE SURG ORTHO 9.0 STRL STRW (GLOVE) ×2 IMPLANT
GOWN PREVENTION PLUS XLARGE (GOWN DISPOSABLE) ×2 IMPLANT
GOWN SRG XL XLNG 56XLVL 4 (GOWN DISPOSABLE) ×1 IMPLANT
GOWN STRL NON-REIN XL XLG LVL4 (GOWN DISPOSABLE) ×1
KIT BASIN OR (CUSTOM PROCEDURE TRAY) ×2 IMPLANT
KIT ROOM TURNOVER OR (KITS) ×2 IMPLANT
MANIFOLD NEPTUNE II (INSTRUMENTS) ×2 IMPLANT
NS IRRIG 1000ML POUR BTL (IV SOLUTION) ×2 IMPLANT
PACK GENERAL/GYN (CUSTOM PROCEDURE TRAY) ×2 IMPLANT
PAD ARMBOARD 7.5X6 YLW CONV (MISCELLANEOUS) ×4 IMPLANT
SPONGE GAUZE 4X4 12PLY (GAUZE/BANDAGES/DRESSINGS) ×2 IMPLANT
SPONGE LAP 18X18 X RAY DECT (DISPOSABLE) IMPLANT
STAPLER VISISTAT 35W (STAPLE) ×2 IMPLANT
STOCKINETTE IMPERVIOUS LG (DRAPES) ×2 IMPLANT
SUT PDS AB 1 CT  36 (SUTURE) ×2
SUT PDS AB 1 CT 36 (SUTURE) ×2 IMPLANT
SUT SILK 2 0 (SUTURE) ×1
SUT SILK 2-0 18XBRD TIE 12 (SUTURE) ×1 IMPLANT
TOWEL OR 17X24 6PK STRL BLUE (TOWEL DISPOSABLE) ×2 IMPLANT
TOWEL OR 17X26 10 PK STRL BLUE (TOWEL DISPOSABLE) ×2 IMPLANT
TUBE ANAEROBIC SPECIMEN COL (MISCELLANEOUS) IMPLANT
WATER STERILE IRR 1000ML POUR (IV SOLUTION) ×2 IMPLANT

## 2012-07-25 NOTE — Anesthesia Procedure Notes (Signed)
Procedure Name: LMA Insertion Date/Time: 07/25/2012 8:42 AM Performed by: Carmela Rima Pre-anesthesia Checklist: Patient identified, Timeout performed, Emergency Drugs available, Suction available and Patient being monitored Patient Re-evaluated:Patient Re-evaluated prior to inductionOxygen Delivery Method: Circle system utilized Preoxygenation: Pre-oxygenation with 100% oxygen Intubation Type: IV induction Ventilation: Mask ventilation without difficulty LMA: LMA inserted LMA Size: 4.0 Number of attempts: 1 Placement Confirmation: positive ETCO2 and breath sounds checked- equal and bilateral Tube secured with: Tape Dental Injury: Teeth and Oropharynx as per pre-operative assessment

## 2012-07-25 NOTE — Preoperative (Signed)
Beta Blockers   Reason not to administer Beta Blockers:Not Applicable 

## 2012-07-25 NOTE — Progress Notes (Addendum)
Clinical Social Work Department  BRIEF PSYCHOSOCIAL ASSESSMENT  Patient: EVELYNNE SPIERS Account [id number] Admit date: 07/25/12 Clinical Social Worker Sabino Niemann, MSW Date/Time: 07/25/2012 2:44PM Referred by: Physician Date Referred:07/25/2012 Referred for   Returning to SNF    Other Referral:  Interview type: Patient  Other interview type: PSYCHOSOCIAL DATA  Living Status: lives with spouse Admitted from facility: [facility] Level of care: SNF Primary support name: Mccalister,Robert   Primary support relationship to patient: spouse Degree of support available:  Strong and vested  CURRENT CONCERNS  Current Concerns   Post-Acute Placement   Other Concerns:  SOCIAL WORK ASSESSMENT / PLAN  CSW met with pt [id number] is from [facility] [place].   Pt lives with her husband  CSW explained placement process and answered questions.   Pt reports she is agreeable to returning to [facility] [place]    CSW completed FL2 and sent clinicals to [facility]     Assessment/plan status: Information/Referral to [facility]  Other assessment/ plan:  Information/referral to community resources:  SNF   PTAR   PATIENT'S/FAMILY'S RESPONSE TO PLAN OF CARE:  Pt  reports she is agreeable to returning to SNF in order to increase strength and independence with mobility prior to return home  Pt verbalized understanding of placement process and appreciation for CSW assist.   Sabino Niemann, MSW [phone]

## 2012-07-25 NOTE — Op Note (Signed)
OPERATIVE REPORT  DATE OF SURGERY: 07/25/2012  PATIENT:  [name],  67 y.o. female  PRE-OPERATIVE DIAGNOSIS:  Gangrene Left Foot/Ankle  POST-OPERATIVE DIAGNOSIS:  gangrene left foot/ankle  PROCEDURE:  Procedure(s): LEFT AMPUTATION BELOW KNEE  SURGEON:  Surgeon(s): Nadara Mustard, MD  ANESTHESIA:   general  EBL:  min ML  SPECIMEN:  Source of Specimen:  Left leg  TOURNIQUET:   Total Tourniquet Time Documented: Thigh (Left) - 9 minutes Total: Thigh (Left) - 9 minutes   PROCEDURE DETAILS: Patient is a day 67 year old woman with severe peripheral vascular disease. She has gangrene of the entire left foot with gangrenous ulcers of the mid tibia after failure conservative treatment patient presents at this time for transtibial amputation. Risks and benefits were discussed including infection neurovascular injury nonhealing of the wound need for an above-the-knee amputation. Patient states she understands and wished to proceed at this time. Distraction of procedure patient brought to the operating room and underwent a general anesthetic. After adequate levels and anesthesia obtained patient's left lower extremity was prepped using DuraPrep draped in the sterile field in the gangrenous left foot was draped out into an impervious stockinette. A transverse incision was made 10 cm distal the tibial tubercle this curved proximally and a large posterior flap was created. The tibia was transected proximal to the skin incision the fibula was transected just proximal to the tibial incision. A patient I've was used to create a large posterior flap the sciatic nerve was pulled cut and allowed to retract. The vascular bundles were suture ligated with 2-0 silk. The tourniquet was deflated hemostasis was obtained. There was some ischemic muscles in the flap and these were all excised back to healthy contractile muscle. Due to the fact that this was extremely close to the ischemic level of her leg I am  concerned that this may have difficulty healing. The deep and superficial fascial layers were closed using #1 PDS the skin was closed using staples. The wound is covered Adaptic orthopedic sponges AB dressing Kerlix and Coban. Patient was extubated taken to the PACU in stable condition.  PLAN OF CARE: Admit to inpatient   PATIENT DISPOSITION:  PACU - hemodynamically stable.   Nadara Mustard, MD 07/25/2012 9:17 AM

## 2012-07-25 NOTE — Progress Notes (Signed)
Utilization Review Completed.   Madelline Eshbach, RN, BSN Nurse Case Manager  [phone]  

## 2012-07-25 NOTE — Consult Note (Signed)
[facility] Renal Consultation Note  Indication for Consultation:  Management of ESRD/hemodialysis; anemia, hypertension/volume and secondary hyperparathyroidism  HPI: [name] is a 67 y.o. female with ESRD on dialysis on TTS in [place] who recently failed conservative treatment for multiple wounds on the left foot and presented today for scheduled left below-knee amputation by Dr. Aldean Baker.  She has been a resident of [facility] skilled nursing facility in [place] since hospitalization 05/19/12- 06/04/12 for left distal femur fracture, secondary to a fall, with IM nailing by Dr. Myrene Galas on 12/22.  [name] currently stable in recovery and has no complaints.  Dialysis Orders: Center: [place] on TTS. EDW 72 kg   HD Bath 4K/2.25Ca  Time 3 hrs 45 mins   Heparin 0. Access Right IJ catheter  BFR 400 DFR 800   Hectorol 1 mcg IV/HD   Epogen 20,000 Units IV/HD  Venofer 0.  Other  Profile 4  Past Medical History  Diagnosis Date  . ESRD on hemodialysis     MWF at [facility] HD. Started HD November 26, 2009. 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  Marland Kitchen COPD (chronic obstructive pulmonary disease)   . Diabetes mellitus   . Hypertension   . Thyroid disease   . Hyperlipidemia   . Bronchitis   . Leg pain   . 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 Peripheral vascular disease   . Hypothyroidism   . Arthritis     knee   Past Surgical History  Procedure Laterality Date  . Cholecystectomy    . Dg av dialysis shunt access exist*r* or      working right HD catheter  . Av fistula placement  05/18/2010  . Femur im nail  05/20/2012    Procedure: INTRAMEDULLARY (IM) RETROGRADE FEMORAL NAILING;  Surgeon: Budd Palmer, MD;  Location: [facility] OR;  Service: Orthopedics;  Laterality: Left;   No family history on file.  Social History  reports that she quit smoking cigarettes about 2 months ago after a 15 pack-year smoking history. She has never used  smokeless tobacco. She reports that she does not drink alcohol or use illicit drugs . Allergies  Allergen Reactions  . Contrast Media (Iodinated Diagnostic Agents) Itching  . Fish Allergy Other (See Comments)    Reaction unknown   Prior to Admission medications   Medication Sig Start Date End Date Taking? Authorizing Provider  amLODipine (NORVASC) 5 MG tablet Take 1 tablet (5 mg total) by mouth daily. 06/04/12  Yes Ripudeep Jenna Luo, MD  calcium acetate (PHOSLO) 667 MG capsule Take 1 capsule (667 mg total) by mouth 2 (two) times daily with a meal. 06/04/12  Yes Ripudeep K [name], MD  chlorpheniramine-HYDROcodone (TUSSIONEX) 10-8 MG/5ML LQCR Take 5 mLs by mouth every 12 (twelve) hours as needed (cough). 06/01/12  Yes Ripudeep Jenna Luo, MD  diclofenac sodium (VOLTAREN) 1 % GEL Apply 4 g topically every 8 (eight) hours as needed. For pain   Yes Historical Provider, MD  feeding supplement (PRO-STAT SUGAR FREE 64) LIQD Take 30 mLs by mouth 2 (two) times daily with breakfast and lunch. 06/01/12  Yes Ripudeep Jenna Luo, MD  fluticasone (FLONASE) 50 MCG/ACT nasal spray Place 2 sprays into the nose Daily. 05/02/12  Yes Historical Provider, MD  HYDROcodone-acetaminophen (NORCO) 10-325 MG per tablet Take 1-2 tablets by mouth every 6 (six) hours as needed for pain.   Yes Historical Provider, MD  insulin aspart (NOVOLOG)  100 UNIT/ML injection Inject 0-15 Units into the skin every 4 (four) hours. CBG < 70: Drink juice; CBG 70 - 120: 0 units: CBG 121 - 150: 2 units; CBG 151 - 200: 3 units; CBG 201 - 250: 5 units; CBG 251 - 300: 8 units;CBG 301 - 350: 11 units; CBG 351 - 400: 15 units; CBG > 400 : 15 units and Call MD 06/01/12  Yes Ripudeep Jenna Luo, MD  levothyroxine (SYNTHROID, LEVOTHROID) 75 MCG tablet Take 75 mcg by mouth daily.     Yes Historical Provider, MD  methocarbamol (ROBAXIN) 500 MG tablet Take 500 mg by mouth every 6 (six) hours as needed. For muscle spasms 06/04/12  Yes Ripudeep Jenna Luo, MD  metoCLOPramide (REGLAN) 5 MG tablet  Take 1-2 tablets (5-10 mg total) by mouth every 8 (eight) hours as needed (if ondansetron (ZOFRAN) ineffective.). 06/01/12  Yes Ripudeep Jenna Luo, MD  multivitamin (RENA-VIT) TABS tablet Take 1 tablet by mouth daily.   Yes Historical Provider, MD  niacin (NIASPAN) 500 MG CR tablet Take 500 mg by mouth daily.    Yes Historical Provider, MD  Nutritional Supplements (FEEDING SUPPLEMENT, NEPRO CARB STEADY,) LIQD Take 237 mLs by mouth 2 (two) times daily between meals. 06/01/12  Yes Ripudeep Jenna Luo, MD  ondansetron (ZOFRAN) 4 MG tablet Take 1 tablet (4 mg total) by mouth every 6 (six) hours as needed for nausea. 06/01/12  Yes Ripudeep Jenna Luo, MD  oxyCODONE (OXY IR/ROXICODONE) 5 MG immediate release tablet Take 5 mg by mouth every 4 (four) hours as needed for pain. 06/01/12  Yes Ripudeep Jenna Luo, MD  oxyCODONE-acetaminophen (PERCOCET/ROXICET) 5-325 MG per tablet Take 1 tablet by mouth every 4 (four) hours as needed for pain.   Yes Historical Provider, MD  Probiotic Product (PROBIOTIC DAILY PO) Take 1 capsule by mouth daily.   Yes Historical Provider, MD  sodium chloride (OCEAN) 0.65 % nasal spray Place 2 sprays into the nose every 4 (four) hours as needed for congestion.   Yes Historical Provider, MD  sodium chloride 0.9 % SOLN 100 mL with ferric gluconate 12.5 MG/ML SOLN 125 mg Inject 125 mg into the vein every Monday, Wednesday, and Friday with hemodialysis. 06/01/12  Yes Ripudeep Jenna Luo, MD  zinc oxide 20 % ointment Apply 1 application topically every 8 (eight) hours as needed for dry skin.   Yes Historical Provider, MD   Labs:  Results for orders placed during the hospital encounter of 07/25/12 (from the past 48 hour(s))  CBC     Status: Abnormal   Collection Time    07/25/12  7:05 AM      Result Value Range   WBC 10.8 (*) 4.0 - 10.5 K/uL   RBC 3.80 (*) 3.87 - 5.11 MIL/uL   Hemoglobin 9.6 (*) 12.0 - 15.0 g/dL   HCT 96.0 (*) 45.4 - 09.8 %   MCV 85.0  78.0 - 100.0 fL   MCH 25.3 (*) 26.0 - 34.0 pg   MCHC 29.7 (*)  30.0 - 36.0 g/dL   RDW 11.9 (*) 14.7 - 82.9 %   Platelets 220  150 - 400 K/uL  APTT     Status: None   Collection Time    07/25/12  7:05 AM      Result Value Range   aPTT 32  24 - 37 seconds  COMPREHENSIVE METABOLIC PANEL     Status: Abnormal   Collection Time    07/25/12  7:05 AM  Result Value Range   Sodium 134 (*) 135 - 145 mEq/L   Potassium 5.2 (*) 3.5 - 5.1 mEq/L   Chloride 96  96 - 112 mEq/L   CO2 27  19 - 32 mEq/L   Glucose, Bld 104 (*) 70 - 99 mg/dL   BUN 28 (*) 6 - 23 mg/dL   Creatinine, Ser 1.47 (*) 0.50 - 1.10 mg/dL   Calcium 9.4  8.4 - 82.9 mg/dL   Total Protein 7.4  6.0 - 8.3 g/dL   Albumin 2.3 (*) 3.5 - 5.2 g/dL   AST 32  0 - 37 U/L   ALT 9  0 - 35 U/L   Alkaline Phosphatase 132 (*) 39 - 117 U/L   Total Bilirubin 0.3  0.3 - 1.2 mg/dL   GFR calc non Af Amer 10 (*) >90 mL/min   GFR calc Af Amer 12 (*) >90 mL/min   Comment:            The eGFR has been calculated     using the CKD EPI equation.     This calculation has not been     validated in all clinical     situations.     eGFR's persistently     <90 mL/min signify     possible Chronic Kidney Disease.  PROTIME-INR     Status: None   Collection Time    07/25/12  7:05 AM      Result Value Range   Prothrombin Time 13.5  11.6 - 15.2 seconds   INR 1.04  0.00 - 1.49  SURGICAL PCR SCREEN     Status: None   Collection Time    07/25/12  7:16 AM      Result Value Range   MRSA, PCR NEGATIVE  NEGATIVE   Staphylococcus aureus NEGATIVE  NEGATIVE   Comment:            The Xpert SA Assay (FDA     approved for NASAL specimens     in patients over 19 years of age),     is one component of     a comprehensive surveillance     program.  Test performance has     been validated by [employer] for patients greater     than or equal to 48 year old.     It is not intended     to diagnose infection nor to     guide or monitor treatment.  GLUCOSE, CAPILLARY     Status: None   Collection Time    07/25/12   7:25 AM      Result Value Range   Glucose-Capillary 89  70 - 99 mg/dL   Constitutional: negative for chills, fatigue, fevers and sweats Ears, nose, mouth, throat, and face: negative for earaches, hoarseness, nasal congestion and sore throat Respiratory: negative for cough, dyspnea on exertion, hemoptysis and sputum Cardiovascular: negative for chest pain, chest pressure/discomfort, dyspnea, orthopnea and palpitations Gastrointestinal: negative for abdominal pain, change in bowel habits, nausea and vomiting Genitourinary:negative, oliguric Musculoskeletal:negative for arthralgias, back pain, myalgias and neck pain Neurological: negative for dizziness, headaches, paresthesia and speech problems  Physical Exam: Filed Vitals:   07/25/12 0722  BP: 173/75  Pulse: 85  Temp: 98.6 F (37 C)  Resp: 18     General appearance: somnolent post-surgery, cooperative and no distress Head: Normocephalic, without obvious abnormality Neck: no adenopathy, no carotid bruit, no JVD and supple, symmetrical, trachea midline Resp: clear  to auscultation anteriorly Cardio: regular rate and rhythm, S1, S2 normal, no murmur, click, rub or gallop GI: soft, non-tender; bowel sounds normal; no masses,  no organomegaly Extremities: left BKA wrapped, no edema on right Neurologic: Grossly normal Dialysis Access: Right IJ catheter   Assessment/Plan: 1. Non-healing left foot wounds - s/p left BKA per Dr. Lajoyce Corners today; stable in recovery. 2. ESRD -  HD on TTS in [place]; K 5.2.  HD tomorrow. 3. Hypertension/volume  - BP 177/79, on outpatient Amlodipine 5 mg qd; EDW 72 kg, new EDW to be established post-surgery.  4. Anemia  - Hgb 9.6, on outpatient Epogen 20,000 U, anticipating post-surgery drop.  Aranesp 200 mcg tomorrow. 5. Metabolic bone disease -  Ca 9.4 (10.8 corrected), no recent P; Hectorol 1 mcg, Tums Ultra with meals, Vitamin D 50,000 U qwk x 12 wks. 6. Nutrition - Alb 2.3, high protein renal diet. 7. DM  Type 2 - on Insulin. 8. Hypothyroidism - on Synthroid. 9. Hx left distal femur fx - secondary to fall, s/p IM femoral nailing per Dr. Carola Frost 12/22.  LYLES,CHARLES 07/25/2012, 9:27 AM   Attending Nephrologist: Delano Metz, MD  Patient seen and examined.  Agree with assessment and plan as above. [name] was seen by Dr Edilia Bo with vascular surgery regarding these wounds recently; he noted significant vascular disease by noninvasive studies and noted that a bypass surgery could be done but didn't think it would heal the significant wounds on her foot. Primary recommendation was for amputation, which was done today by ortho service, Dr. Lajoyce Corners. Patient is stable post-op. Will follow for dialysis and related issues.  Vinson Moselle  MD [phone] pgr    [phone] cell 07/25/2012, 1:29 PM

## 2012-07-25 NOTE — H&P (Addendum)
[name] is an 68 y.o. female.   Chief Complaint: Gangrene left foot HPI: Patient is a 67 year old woman with diabetes peripheral vascular disease who has failed wound care treatment with dehiscence and gangrene of the left foot and presents at this time for transtibial amputation.  Past Medical History  Diagnosis Date  . ESRD on hemodialysis     MWF at [facility] HD. Started HD November 26, 2009. 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  Marland Kitchen COPD (chronic obstructive pulmonary disease)   . Diabetes mellitus   . Hypertension   . Thyroid disease   . Hyperlipidemia   . Bronchitis   . Leg pain   . 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 Peripheral vascular disease   . Hypothyroidism   . Arthritis     knee    Past Surgical History  Procedure Laterality Date  . Cholecystectomy    . Dg av dialysis shunt access exist*r* or      working right HD catheter  . Av fistula placement  05/18/2010  . Femur im nail  05/20/2012    Procedure: INTRAMEDULLARY (IM) RETROGRADE FEMORAL NAILING;  Surgeon: Budd Palmer, MD;  Location: [facility] OR;  Service: Orthopedics;  Laterality: Left;    No family history on file. Social History:  reports that she quit smoking about 2 months ago. Her smoking use included Cigarettes. She has a 15 pack-year smoking history. She has never used smokeless tobacco. She reports that she does not drink alcohol or use illicit drugs.  Allergies:  Allergies  Allergen Reactions  . Contrast Media (Iodinated Diagnostic Agents) Itching    No prescriptions prior to admission    No results found for this or any previous visit (from the past 48 hour(s)). No results found.  Review of Systems  All other systems reviewed and are negative.    There were no vitals taken for this visit. Physical Exam  Ulceration abscess necrotic tissue left foot. Assessment/Plan Assessment: Gangrene left foot.   Plan due to failure conservative treatment  patient presents at this time for left transtibial amputation . Risks and benefits were discussed including infection neurovascular injury nonhealing of the wound need for higher level amputation. Patient states she understands was to proceed at this time.  [name] V 07/25/2012, 6:22 AM

## 2012-07-25 NOTE — Anesthesia Postprocedure Evaluation (Signed)
  Anesthesia Post-op Note  Patient: [name]  Procedure(s) Performed: Procedure(s) with comments: LEFT AMPUTATION BELOW KNEE (Left) - Left Below Knee Amputation  Patient Location: PACU  Anesthesia Type:General  Level of Consciousness: awake  Airway and Oxygen Therapy: Patient Spontanous Breathing  Post-op Pain: mild  Post-op Assessment: Post-op Vital signs reviewed, Patient's Cardiovascular Status Stable, Respiratory Function Stable, Patent Airway, No signs of Nausea or vomiting and Pain level controlled  Post-op Vital Signs: stable  Complications: No apparent anesthesia complications 

## 2012-07-25 NOTE — Transfer of Care (Signed)
Immediate Anesthesia Transfer of Care Note  Patient: [name]  Procedure(s) Performed: Procedure(s) with comments: LEFT AMPUTATION BELOW KNEE (Left) - Left Below Knee Amputation  Patient Location: PACU  Anesthesia Type:General  Level of Consciousness: awake, alert  and oriented  Airway & Oxygen Therapy: Patient Spontanous Breathing and Patient connected to nasal cannula oxygen  Post-op Assessment: Report given to PACU RN, Post -op Vital signs reviewed and stable and Patient moving all extremities X 4  Post vital signs: Reviewed and stable  Complications: No apparent anesthesia complications

## 2012-07-25 NOTE — Anesthesia Preprocedure Evaluation (Addendum)
Anesthesia Evaluation  Patient identified by MRN, date of birth, ID band Patient awake    Reviewed: Allergy & Precautions, H&P , NPO status , Patient's Chart, lab work & pertinent test results  Airway Mallampati: II TM Distance: >3 FB Neck ROM: Full    Dental  (+) Dental Advidsory Given   Pulmonary COPDformer smoker,  breath sounds clear to auscultation        Cardiovascular hypertension, + Peripheral Vascular Disease Rhythm:Regular Rate:Normal     Neuro/Psych    GI/Hepatic   Endo/Other  diabetesHypothyroidism   Renal/GU ESRF and DialysisRenal disease     Musculoskeletal   Abdominal   Peds  Hematology   Anesthesia Other Findings   Reproductive/Obstetrics                          Anesthesia Physical Anesthesia Plan  ASA: III  Anesthesia Plan: General   Post-op Pain Management:    Induction: Intravenous  Airway Management Planned: Oral ETT  Additional Equipment:   Intra-op Plan:   Post-operative Plan: Extubation in OR  Informed Consent: I have reviewed the patients History and Physical, chart, labs and discussed the procedure including the risks, benefits and alternatives for the proposed anesthesia with the patient or authorized representative who has indicated his/her understanding and acceptance.   Dental Advisory Given  Plan Discussed with: CRNA, Surgeon and Anesthesiologist  Anesthesia Plan Comments:        Anesthesia Quick Evaluation

## 2012-07-25 NOTE — Progress Notes (Signed)
Pharmacy was consulted to dose coumadin for VTE px in [name].  [name] a 67 yo F s/p L BKA. 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 SHe also has anemia of ESRD and is on aranesp. I called Dr. Audrie Lia office Plan: dc coumadin protocol Aspirin EC 325 mg qday for VTE Px [name], Pharm.D. [phone] 07/25/2012 1:46 PM

## 2012-07-25 NOTE — Anesthesia Postprocedure Evaluation (Signed)
  Anesthesia Post-op Note  Patient: [name]  Procedure(s) Performed: Procedure(s) with comments: LEFT AMPUTATION BELOW KNEE (Left) - Left Below Knee Amputation  Patient Location: PACU  Anesthesia Type:General  Level of Consciousness: awake  Airway and Oxygen Therapy: Patient Spontanous Breathing  Post-op Pain: mild  Post-op Assessment: Post-op Vital signs reviewed, Patient's Cardiovascular Status Stable, Respiratory Function Stable, Patent Airway, No signs of Nausea or vomiting and Pain level controlled  Post-op Vital Signs: stable  Complications: No apparent anesthesia complications

## 2012-07-26 ENCOUNTER — Encounter (HOSPITAL_COMMUNITY): Payer: Self-pay | Admitting: Orthopedic Surgery

## 2012-07-26 DIAGNOSIS — I120 Hypertensive chronic kidney disease with stage 5 chronic kidney disease or end stage renal disease: Secondary | ICD-10-CM | POA: Diagnosis not present

## 2012-07-26 LAB — RENAL FUNCTION PANEL
Albumin: 2 g/dL — ABNORMAL LOW (ref 3.5–5.2)
BUN: 39 mg/dL — ABNORMAL HIGH (ref 6–23)
CO2: 25 mEq/L (ref 19–32)
CO2: 27 mEq/L (ref 19–32)
Calcium: 8.7 mg/dL (ref 8.4–10.5)
Calcium: 8.6 mg/dL (ref 8.4–10.5)
Creatinine, Ser: 5.51 mg/dL — ABNORMAL HIGH (ref 0.50–1.10)
Creatinine, Ser: 5.45 mg/dL — ABNORMAL HIGH (ref 0.50–1.10)
GFR calc Af Amer: 9 mL/min — ABNORMAL LOW (ref >90)
GFR calc non Af Amer: 7 mL/min — ABNORMAL LOW (ref >90)
GFR calc non Af Amer: 7 mL/min — ABNORMAL LOW (ref >90)
Phosphorus: 3.8 mg/dL (ref 2.3–4.6)
Sodium: 134 mEq/L — ABNORMAL LOW (ref 135–145)

## 2012-07-26 LAB — CBC
HCT: 27.9 % — ABNORMAL LOW (ref 36.0–46.0)
MCH: 25.5 pg — ABNORMAL LOW (ref 26.0–34.0)
MCV: 83.8 fL (ref 78.0–100.0)
Platelets: 238 10*3/uL (ref 150–400)
RBC: 3.33 MIL/uL — ABNORMAL LOW (ref 3.87–5.11)
RDW: 19.8 % — ABNORMAL HIGH (ref 11.5–15.5)

## 2012-07-26 LAB — GLUCOSE, CAPILLARY
Glucose-Capillary: 146 mg/dL — ABNORMAL HIGH (ref 70–99)
Glucose-Capillary: 116 mg/dL — ABNORMAL HIGH (ref 70–99)
Glucose-Capillary: 48 mg/dL — ABNORMAL LOW (ref 70–99)

## 2012-07-26 MED ORDER — ALTEPLASE 2 MG IJ SOLR
4.0000 mg | Freq: Once | INTRAMUSCULAR | Status: AC
Start: 2012-07-26 — End: 2012-07-26
  Administered 2012-07-26: 4 mg
  Filled 2012-07-26: qty 4

## 2012-07-26 MED ORDER — ALTEPLASE 2 MG IJ SOLR
4.0000 mg | Freq: Once | INTRAMUSCULAR | Status: AC
  Administered 2012-07-26: 4 mg
  Filled 2012-07-26: qty 4

## 2012-07-26 NOTE — Progress Notes (Signed)
Patient ID: [name], female   DOB: 04/06/1946, 67 y.o.   MRN: [id number] Postoperative day 1 left transtibial amputation. Patient denies any pain. Plan for dialysis today. Plan for social worker for discharge planning for short-term skilled nursing.

## 2012-07-26 NOTE — Evaluation (Signed)
Physical Therapy Evaluation Patient Details Name: [name] MRN: [id number] DOB: 03/06/1946 Today's Date: 07/26/2012 Time: 4098-1191 PT Time Calculation (min): 25 min  PT Assessment / Plan / Recommendation Clinical Impression  pt presents with L BKA.  pt vomits shortly after PT arrived to room.  RN made aware and medicated.  pt still agreeable to attempt sitting after vomiting.  pt was at [facility] for rehab PTA and would benefit from return to SNF for continued rehab.      PT Assessment  Patient needs continued PT services    Follow Up Recommendations  SNF    Does the patient have the potential to tolerate intense rehabilitation      Barriers to Discharge None      Equipment Recommendations  None recommended by PT    Recommendations for Other Services     Frequency Min 3X/week    Precautions / Restrictions Precautions Precautions: Fall Restrictions Weight Bearing Restrictions: Yes LLE Weight Bearing: Non weight bearing   Pertinent Vitals/Pain Indicates terrible pain in L residual limb and phantom pains in L foot.        Mobility  Bed Mobility Bed Mobility: Supine to Sit;Sitting - Scoot to Edge of Bed Supine to Sit: 4: Min assist;With rails;HOB elevated Sitting - Scoot to Edge of Bed: 4: Min assist Details for Bed Mobility Assistance: cues for encouragement and safe technique.  pt fearful of mobility.   Transfers Transfers: Not assessed Ambulation/Gait Ambulation/Gait Assistance: Not tested (comment) Stairs: No Wheelchair Mobility Wheelchair Mobility: No    Exercises     PT Diagnosis: Acute pain  PT Problem List: Decreased activity tolerance;Decreased balance;Decreased range of motion;Decreased strength;Decreased mobility;Decreased knowledge of use of DME;Pain PT Treatment Interventions: DME instruction;Gait training;Functional mobility training;Therapeutic activities;Therapeutic exercise;Balance training;Patient/family education   PT Goals Acute Rehab PT  Goals PT Goal Formulation: With patient Time For Goal Achievement: 08/09/12 Potential to Achieve Goals: Good Pt will go Supine/Side to Sit: with modified independence PT Goal: Supine/Side to Sit - Progress: Goal set today Pt will go Sit to Supine/Side: with modified independence PT Goal: Sit to Supine/Side - Progress: Goal set today Pt will go Sit to Stand: with min assist PT Goal: Sit to Stand - Progress: Goal set today Pt will go Stand to Sit: with min assist PT Goal: Stand to Sit - Progress: Goal set today Pt will Transfer Bed to Chair/Chair to Bed: with min assist PT Transfer Goal: Bed to Chair/Chair to Bed - Progress: Goal set today Pt will Ambulate: 16 - 50 feet;with min assist;with rolling walker PT Goal: Ambulate - Progress: Goal set today  Visit Information  Last PT Received On: 07/26/12 Assistance Needed: +2 (if trying transfers)    Subjective Data  Subjective: I don't know why I keep vomiting.   Patient Stated Goal: Walk   Prior Functioning  Home Living Available Help at Discharge: Skilled Nursing Facility ([facility] for rehab since January.  ) Type of Home: Skilled Nursing Facility Additional Comments: pt had been doing therapy at [facility] and plans to return to [facility] to continue rehab at D/C.   Communication Communication: No difficulties    Cognition  Cognition Overall Cognitive Status: Appears within functional limits for tasks assessed/performed Arousal/Alertness: Awake/alert Orientation Level: Appears intact for tasks assessed Behavior During Session: [facility] for tasks performed    Extremity/Trunk Assessment Right Lower Extremity Assessment RLE ROM/Strength/Tone: WFL for tasks assessed RLE Sensation: WFL - Light Touch Left Lower Extremity Assessment LLE ROM/Strength/Tone: Deficits LLE ROM/Strength/Tone Deficits: New BKA.  Limited by pain LLE Sensation: WFL - Light Touch Trunk Assessment Trunk Assessment: Normal   Balance Balance Balance Assessed: Yes Static  Sitting Balance Static Sitting - Balance Support: No upper extremity supported;Feet unsupported Static Sitting - Level of Assistance: 6: Modified independent (Device/Increase time) Static Sitting - Comment/# of Minutes: pt able to sit EOB unsupported.    End of Session PT - End of Session Activity Tolerance: Patient tolerated treatment well Patient left: in bed;with call bell/phone within reach (Sitting EOB eating lunch.  ) Nurse Communication: Mobility status (Nausea)  GP     Sunny Schlein, PT [phone] 07/26/2012, 1:37 PM

## 2012-07-26 NOTE — Progress Notes (Signed)
Subjective:   Feeing well with no pain, good appetite, but brief episode of vomiting this morning; otherwise, no nausea.   Objective: Vital signs in last 24 hours: Temp:  [98.2 F (36.8 C)-98.9 F (37.2 C)] 98.9 F (37.2 C) (02/27 0649) Pulse Rate:  [82-86] 86 (02/27 0649) Resp:  [16-18] 18 (02/27 0649) BP: (124-150)/(65-66) 150/66 mmHg (02/27 0649) SpO2:  [96 %-100 %] 96 % (02/27 0649) Weight change:   Intake/Output from previous day: 02/26 0701 - 02/27 0700 In: 460 [P.O.:60; I.V.:400] Out: 175 [Urine:75; Blood:100]   EXAM: General appearance:  Alert, comfortable, in no apparent distress Resp:  CTA without rales, rhonchi, or wheezes Cardio:   RRR without murmur or rub GI:  + BS, soft and nontender Extremities:  No edema, left BKA wrapped Access:  Right IJ catheter  Lab Results:  Recent Labs  07/25/12 0705  WBC 10.8*  HGB 9.6*  HCT 32.3*  PLT 220   BMET:  Recent Labs  07/25/12 0705  NA 134*  K 5.2*  CL 96  CO2 27  GLUCOSE 104*  BUN 28*  CREATININE 4.09*  CALCIUM 9.4  ALBUMIN 2.3*   No results found for this basename: PTH,  in the last 72 hours Iron Studies: No results found for this basename: IRON, TIBC, TRANSFERRIN, FERRITIN,  in the last 72 hours  Dialysis Orders: Center: [place] on TTS.  EDW 72 kg HD Bath 4K/2.25Ca Time 3 hrs 45 mins Heparin 0. Access Right IJ catheter BFR 400 DFR 800 Hectorol 1 mcg IV/HD Epogen 20,000 Units IV/HD Venofer 0   Profile 4.  Assessment/Plan: 1. Non-healing left foot wounds - s/p left BKA per Dr. Lajoyce Corners 2/26; stable, will likely return to SNF. 2. ESRD - HD on TTS in [place]; K 5.2. HD pending today. 3. Hypertension/volume - BP 150/66, on outpatient Amlodipine 5 mg qd; EDW 72 kg, new EDW to be established post-surgery.  4. Anemia - Hgb 9.6, on outpatient Epogen 20,000 U, anticipating post-surgery drop. Aranesp 200 mcg today. 5. Metabolic bone disease - Ca 9.4 (10.8 corrected), no recent P; Hectorol 1 mcg, Tums Ultra with  meals, Vitamin D 50,000 U qwk x 12 wks. 6. Nutrition - Alb 2.3, high protein renal diet. 7. DM Type 2 - on Insulin. 8. Hypothyroidism - on Synthroid. 9. Hx left distal femur fx - secondary to fall, s/p IM femoral nailing per Dr. Carola Frost 12/22.    LOS: 1 day   LYLES,CHARLES 07/26/2012,12:44 PM  Patient seen and examined.  Agree with assessment and plan as above. Vinson Moselle  MD [phone] pgr    [phone] cell 07/26/2012, 1:49 PM

## 2012-07-27 DIAGNOSIS — D509 Iron deficiency anemia, unspecified: Secondary | ICD-10-CM | POA: Diagnosis not present

## 2012-07-27 DIAGNOSIS — T847XXA Infection and inflammatory reaction due to other internal orthopedic prosthetic devices, implants and grafts, initial encounter: Secondary | ICD-10-CM | POA: Diagnosis not present

## 2012-07-27 DIAGNOSIS — S72309A Unspecified fracture of shaft of unspecified femur, initial encounter for closed fracture: Secondary | ICD-10-CM | POA: Diagnosis not present

## 2012-07-27 DIAGNOSIS — E876 Hypokalemia: Secondary | ICD-10-CM | POA: Diagnosis not present

## 2012-07-27 DIAGNOSIS — D631 Anemia in chronic kidney disease: Secondary | ICD-10-CM | POA: Diagnosis not present

## 2012-07-27 DIAGNOSIS — J690 Pneumonitis due to inhalation of food and vomit: Secondary | ICD-10-CM | POA: Diagnosis not present

## 2012-07-27 DIAGNOSIS — Z23 Encounter for immunization: Secondary | ICD-10-CM | POA: Diagnosis not present

## 2012-07-27 DIAGNOSIS — S7290XA Unspecified fracture of unspecified femur, initial encounter for closed fracture: Secondary | ICD-10-CM | POA: Diagnosis not present

## 2012-07-27 DIAGNOSIS — J698 Pneumonitis due to inhalation of other solids and liquids: Secondary | ICD-10-CM | POA: Diagnosis not present

## 2012-07-27 DIAGNOSIS — N19 Unspecified kidney failure: Secondary | ICD-10-CM | POA: Diagnosis not present

## 2012-07-27 DIAGNOSIS — IMO0001 Reserved for inherently not codable concepts without codable children: Secondary | ICD-10-CM | POA: Diagnosis not present

## 2012-07-27 DIAGNOSIS — D649 Anemia, unspecified: Secondary | ICD-10-CM | POA: Diagnosis not present

## 2012-07-27 DIAGNOSIS — M6281 Muscle weakness (generalized): Secondary | ICD-10-CM | POA: Diagnosis not present

## 2012-07-27 DIAGNOSIS — S88119A Complete traumatic amputation at level between knee and ankle, unspecified lower leg, initial encounter: Secondary | ICD-10-CM | POA: Diagnosis not present

## 2012-07-27 DIAGNOSIS — E1129 Type 2 diabetes mellitus with other diabetic kidney complication: Secondary | ICD-10-CM | POA: Diagnosis not present

## 2012-07-27 LAB — RENAL FUNCTION PANEL
CO2: 25 mEq/L (ref 19–32)
Chloride: 97 mEq/L (ref 96–112)
GFR calc Af Amer: 7 mL/min — ABNORMAL LOW (ref >90)
Glucose, Bld: 165 mg/dL — ABNORMAL HIGH (ref 70–99)
Phosphorus: 7 mg/dL — ABNORMAL HIGH (ref 2.3–4.6)
Potassium: 4.9 mEq/L (ref 3.5–5.1)
Sodium: 134 mEq/L — ABNORMAL LOW (ref 135–145)

## 2012-07-27 LAB — CBC
Hemoglobin: 8.2 g/dL — ABNORMAL LOW (ref 12.0–15.0)
MCHC: 29.8 g/dL — ABNORMAL LOW (ref 30.0–36.0)
RBC: 3.29 MIL/uL — ABNORMAL LOW (ref 3.87–5.11)
WBC: 13.4 10*3/uL — ABNORMAL HIGH (ref 4.0–10.5)

## 2012-07-27 LAB — GLUCOSE, CAPILLARY
Glucose-Capillary: 92 mg/dL (ref 70–99)
Glucose-Capillary: 97 mg/dL (ref 70–99)
Glucose-Capillary: 211 mg/dL — ABNORMAL HIGH (ref 70–99)

## 2012-07-27 MED ORDER — OXYCODONE HCL 5 MG PO TABS: 5.0000 mg | ORAL_TABLET | ORAL | Status: DC | PRN

## 2012-07-27 NOTE — Progress Notes (Signed)
Occupational Therapy Note  Chart reviewed.  Noted that pt was receiving rehab at [facility] in [place], and will return there upon discharge.  Discharge summary in the chart.  Pt in HD at this time.  Will defer OT eval to SNF.    Jeani Hawking, OTR/L [phone]

## 2012-07-27 NOTE — Progress Notes (Signed)
PT Cancellation Note  Patient Details Name: [name] MRN: [id number] DOB: May 05, 1946   Cancelled Treatment:    Reason Eval/Treat Not Completed: Patient at procedure or test/unavailable. Patient going for dialysis at this time. Will attempt later as time allows.  07/27/2012 Fredrich Birks PTA [phone] pager [phone] office      Fredrich Birks 07/27/2012, 9:40 AM

## 2012-07-27 NOTE — Progress Notes (Signed)
Patient ID: [name], female   DOB: [birth year], 67 y.o.   MRN: [id number] Patient with nausea and vomiting yesterday. Patient states over the past few months she has had episodes of nausea and vomiting. Patient states she did not receive dialysis yesterday plan for dialysis and then discharged back to skilled nursing. F. L2 completed discharge completed

## 2012-07-27 NOTE — Progress Notes (Signed)
Clinical social worker assisted with patient discharge to skilled nursing facility, [facility] [place].  CSW addressed all family questions and concerns. CSW copied chart and added all important documents. CSW also set up patient transportation with [employer]. Clinical Social Worker will sign off for now as social work intervention is no longer needed.   Sabino Niemann, MSW, [phone]

## 2012-07-27 NOTE — Discharge Summary (Signed)
Physician Discharge Summary  Patient ID: [name] MRN: [id number] DOB/AGE: 1945-12-19 67 y.o.  Admit date: 07/25/2012 Discharge date: 07/27/2012  Admission Diagnoses: Gangrene left foot  Discharge Diagnoses: Gangrene left foot Active Problems:   DIABETES MELLITUS, TYPE II, UNCONTROLLED, WITH COMPLICATIONS   Discharged Condition: stable  Hospital Course: Patient is a 67 year old woman who was admitted for a gangrenous changes to the left foot and left leg. She failed conservative wound care and presents at this time for transtibial amputation. Postoperatively patient did have one episode of nausea and vomiting she received dialysis and was transferred back to skilled nursing. Patient did have some mild ischemic changes at the site of the transtibial amputation.  Consults: nephrology  Significant Diagnostic Studies: labs: Routine labs  Treatments: dialysis: Hemodialysis and surgery: See operative note for transtibial amputation  Discharge Exam: Blood pressure 157/66, pulse 83, temperature 98 F (36.7 C), temperature source Oral, resp. rate 18, weight 0 kg (0 lb), SpO2 96.00%. Incision/Wound: clean and dry  Disposition: 03-Skilled Nursing Facility     Medication List    ASK your doctor about these medications       amLODipine 5 MG tablet  Commonly known as:  NORVASC  Take 1 tablet (5 mg total) by mouth daily.     calcium acetate 667 MG capsule  Commonly known as:  PHOSLO  Take 1 capsule (667 mg total) by mouth 2 (two) times daily with a meal.     chlorpheniramine-HYDROcodone 10-8 MG/5ML Lqcr  Commonly known as:  TUSSIONEX  Take 5 mLs by mouth every 12 (twelve) hours as needed (cough).     diclofenac sodium 1 % Gel  Commonly known as:  VOLTAREN  Apply 4 g topically every 8 (eight) hours as needed. For pain     feeding supplement (NEPRO CARB STEADY) Liqd  Take 237 mLs by mouth 2 (two) times daily between meals.     feeding supplement Liqd  Take 30 mLs by mouth  2 (two) times daily with breakfast and lunch.     fluticasone 50 MCG/ACT nasal spray  Commonly known as:  FLONASE  Place 2 sprays into the nose Daily.     HYDROcodone-acetaminophen 10-325 MG per tablet  Commonly known as:  NORCO  Take 1-2 tablets by mouth every 6 (six) hours as needed for pain.     insulin aspart 100 UNIT/ML injection  Commonly known as:  novoLOG  Inject 0-15 Units into the skin every 4 (four) hours. CBG < 70: Drink juice; CBG 70 - 120: 0 units: CBG 121 - 150: 2 units; CBG 151 - 200: 3 units; CBG 201 - 250: 5 units; CBG 251 - 300: 8 units;CBG 301 - 350: 11 units; CBG 351 - 400: 15 units; CBG > 400 : 15 units and Call MD     levothyroxine 75 MCG tablet  Commonly known as:  SYNTHROID, LEVOTHROID  Take 75 mcg by mouth daily.     methocarbamol 500 MG tablet  Commonly known as:  ROBAXIN  Take 500 mg by mouth every 6 (six) hours as needed. For muscle spasms     metoCLOPramide 5 MG tablet  Commonly known as:  REGLAN  Take 1-2 tablets (5-10 mg total) by mouth every 8 (eight) hours as needed (if ondansetron (ZOFRAN) ineffective.).     multivitamin Tabs tablet  Take 1 tablet by mouth daily.     niacin 500 MG CR tablet  Commonly known as:  NIASPAN  Take 500 mg by mouth daily.  ondansetron 4 MG tablet  Commonly known as:  ZOFRAN  Take 1 tablet (4 mg total) by mouth every 6 (six) hours as needed for nausea.     oxyCODONE 5 MG immediate release tablet  Commonly known as:  Oxy IR/ROXICODONE  Take 5 mg by mouth every 4 (four) hours as needed for pain.     oxyCODONE-acetaminophen 5-325 MG per tablet  Commonly known as:  PERCOCET/ROXICET  Take 1 tablet by mouth every 4 (four) hours as needed for pain.     PROBIOTIC DAILY PO  Take 1 capsule by mouth daily.     sodium chloride 0.65 % nasal spray  Commonly known as:  OCEAN  Place 2 sprays into the nose every 4 (four) hours as needed for congestion.     sodium chloride 0.9 % SOLN 100 mL with ferric gluconate 12.5  MG/ML SOLN 125 mg  Inject 125 mg into the vein every Monday, Wednesday, and Friday with hemodialysis.     zinc oxide 20 % ointment  Apply 1 application topically every 8 (eight) hours as needed for dry skin.           Follow-up Information   Follow up with Abdulraheem Pineo V, MD In 2 weeks.   Contact information:   [address] [place] [address] [phone]       Signed: Nadara Mustard 07/27/2012, 6:21 AM

## 2012-07-27 NOTE — Progress Notes (Signed)
Subjective:   Feels better today  Objective: Vital signs in last 24 hours: Temp:  [98 F (36.7 C)-98.7 F (37.1 C)] 98.5 F (36.9 C) (02/28 0945) Pulse Rate:  [83-94] 86 (02/28 1030) Resp:  [16-20] 18 (02/28 1030) BP: (134-180)/(61-92) 157/78 mmHg (02/28 1030) SpO2:  [96 %-98 %] 97 % (02/28 0945) Weight:  [70.6 kg (155 lb 10.3 oz)-71.5 kg (157 lb 10.1 oz)] 71.5 kg (157 lb 10.1 oz) (02/28 0945) Weight change:   Intake/Output from previous day: 02/27 0701 - 02/28 0700 In: 480 [P.O.:480] Out: -579 [Stool:1] Total I/O In: 240 [P.O.:240] Out: -  EXAM: General appearance:  Alert, comfortable, in no apparent distress Resp:  CTA without rales, rhonchi, or wheezes Cardio:   RRR without murmur or rub GI:  + BS, soft and nontender Extremities:  No edema, left BKA wrapped Access:  Right IJ catheter  Lab Results:  Recent Labs  07/25/12 0705 07/26/12 1400  WBC 10.8* 11.2*  HGB 9.6* 8.5*  HCT 32.3* 27.9*  PLT 220 238   BMET:   Recent Labs  07/26/12 1400 07/26/12 1714  NA 133* 134*  K 4.7 4.8  CL 94* 96  CO2 25 27  GLUCOSE 151* 151*  BUN 39* 39*  CREATININE 5.51* 5.45*  CALCIUM 8.7 8.6  ALBUMIN 2.0* 2.0*   No results found for this basename: PTH,  in the last 72 hours Iron Studies: No results found for this basename: IRON, TIBC, TRANSFERRIN, FERRITIN,  in the last 72 hours  Dialysis Orders: Center: [facility] on TTS.  EDW 72 kg HD Bath 4K/2.25Ca Time 3 hrs 45 mins Heparin 0. Access Right IJ catheter BFR 400 DFR 800 Hectorol 1 mcg IV/HD Epogen 20,000 Units IV/HD Venofer 0   Profile 4.  Assessment/Plan: 1. Non-healing left foot wounds - s/p left BKA per Dr. Lajoyce Corners 2/26 for d/c to SNF today 2. ESRD - HD on TTS in [place]; K 5.2 3. Hypertension/volume - BP 150/66, on outpatient Amlodipine 5 mg qd. Needs lower EDW at discharge, will start at 68kg and challenge from there. 4. Anemia - Hgb 9.6, on outpatient Epogen 20,000 U, anticipating post-surgery drop. Aranesp 200 mcg  today. 5. Metabolic bone disease - Ca 9.4 (10.8 corrected), no recent P; Hectorol 1 mcg, Tums Ultra with meals, Vitamin D 50,000 U qwk x 12 wks. 6. Nutrition - Alb 2.3, high protein renal diet. 7. DM Type 2 - on Insulin. 8. Hypothyroidism - on Synthroid. 9. Hx left distal femur fx - secondary to fall, s/p IM femoral nailing per Dr. Carola Frost 12/22.   Sheila Moselle  MD [phone] pgr    [phone] cell 07/27/2012, 10:55 AM

## 2012-07-28 DIAGNOSIS — J698 Pneumonitis due to inhalation of other solids and liquids: Secondary | ICD-10-CM | POA: Diagnosis not present

## 2012-07-28 DIAGNOSIS — Z23 Encounter for immunization: Secondary | ICD-10-CM | POA: Diagnosis not present

## 2012-07-28 DIAGNOSIS — N2581 Secondary hyperparathyroidism of renal origin: Secondary | ICD-10-CM | POA: Diagnosis not present

## 2012-07-28 DIAGNOSIS — D631 Anemia in chronic kidney disease: Secondary | ICD-10-CM | POA: Diagnosis not present

## 2012-07-28 DIAGNOSIS — N186 End stage renal disease: Secondary | ICD-10-CM | POA: Diagnosis not present

## 2012-07-28 DIAGNOSIS — J690 Pneumonitis due to inhalation of food and vomit: Secondary | ICD-10-CM | POA: Diagnosis not present

## 2012-07-31 DIAGNOSIS — N2581 Secondary hyperparathyroidism of renal origin: Secondary | ICD-10-CM | POA: Diagnosis not present

## 2012-07-31 DIAGNOSIS — J690 Pneumonitis due to inhalation of food and vomit: Secondary | ICD-10-CM | POA: Diagnosis not present

## 2012-07-31 DIAGNOSIS — N186 End stage renal disease: Secondary | ICD-10-CM | POA: Diagnosis not present

## 2012-07-31 DIAGNOSIS — Z23 Encounter for immunization: Secondary | ICD-10-CM | POA: Diagnosis not present

## 2012-07-31 DIAGNOSIS — D631 Anemia in chronic kidney disease: Secondary | ICD-10-CM | POA: Diagnosis not present

## 2012-07-31 DIAGNOSIS — J698 Pneumonitis due to inhalation of other solids and liquids: Secondary | ICD-10-CM | POA: Diagnosis not present

## 2012-08-01 DIAGNOSIS — N19 Unspecified kidney failure: Secondary | ICD-10-CM | POA: Diagnosis not present

## 2012-08-01 DIAGNOSIS — D649 Anemia, unspecified: Secondary | ICD-10-CM | POA: Diagnosis not present

## 2012-08-01 DIAGNOSIS — S88119A Complete traumatic amputation at level between knee and ankle, unspecified lower leg, initial encounter: Secondary | ICD-10-CM | POA: Diagnosis not present

## 2012-08-02 DIAGNOSIS — N2581 Secondary hyperparathyroidism of renal origin: Secondary | ICD-10-CM | POA: Diagnosis not present

## 2012-08-02 DIAGNOSIS — J698 Pneumonitis due to inhalation of other solids and liquids: Secondary | ICD-10-CM | POA: Diagnosis not present

## 2012-08-02 DIAGNOSIS — N039 Chronic nephritic syndrome with unspecified morphologic changes: Secondary | ICD-10-CM | POA: Diagnosis not present

## 2012-08-02 DIAGNOSIS — N186 End stage renal disease: Secondary | ICD-10-CM | POA: Diagnosis not present

## 2012-08-02 DIAGNOSIS — J690 Pneumonitis due to inhalation of food and vomit: Secondary | ICD-10-CM | POA: Diagnosis not present

## 2012-08-02 DIAGNOSIS — Z23 Encounter for immunization: Secondary | ICD-10-CM | POA: Diagnosis not present

## 2012-08-04 DIAGNOSIS — N039 Chronic nephritic syndrome with unspecified morphologic changes: Secondary | ICD-10-CM | POA: Diagnosis not present

## 2012-08-04 DIAGNOSIS — N2581 Secondary hyperparathyroidism of renal origin: Secondary | ICD-10-CM | POA: Diagnosis not present

## 2012-08-04 DIAGNOSIS — Z23 Encounter for immunization: Secondary | ICD-10-CM | POA: Diagnosis not present

## 2012-08-04 DIAGNOSIS — J690 Pneumonitis due to inhalation of food and vomit: Secondary | ICD-10-CM | POA: Diagnosis not present

## 2012-08-04 DIAGNOSIS — J698 Pneumonitis due to inhalation of other solids and liquids: Secondary | ICD-10-CM | POA: Diagnosis not present

## 2012-08-04 DIAGNOSIS — N186 End stage renal disease: Secondary | ICD-10-CM | POA: Diagnosis not present

## 2012-08-07 DIAGNOSIS — J690 Pneumonitis due to inhalation of food and vomit: Secondary | ICD-10-CM | POA: Diagnosis not present

## 2012-08-07 DIAGNOSIS — N039 Chronic nephritic syndrome with unspecified morphologic changes: Secondary | ICD-10-CM | POA: Diagnosis not present

## 2012-08-07 DIAGNOSIS — N2581 Secondary hyperparathyroidism of renal origin: Secondary | ICD-10-CM | POA: Diagnosis not present

## 2012-08-07 DIAGNOSIS — J698 Pneumonitis due to inhalation of other solids and liquids: Secondary | ICD-10-CM | POA: Diagnosis not present

## 2012-08-07 DIAGNOSIS — Z23 Encounter for immunization: Secondary | ICD-10-CM | POA: Diagnosis not present

## 2012-08-07 DIAGNOSIS — N186 End stage renal disease: Secondary | ICD-10-CM | POA: Diagnosis not present

## 2012-08-09 DIAGNOSIS — Z23 Encounter for immunization: Secondary | ICD-10-CM | POA: Diagnosis not present

## 2012-08-09 DIAGNOSIS — N186 End stage renal disease: Secondary | ICD-10-CM | POA: Diagnosis not present

## 2012-08-09 DIAGNOSIS — J690 Pneumonitis due to inhalation of food and vomit: Secondary | ICD-10-CM | POA: Diagnosis not present

## 2012-08-09 DIAGNOSIS — J698 Pneumonitis due to inhalation of other solids and liquids: Secondary | ICD-10-CM | POA: Diagnosis not present

## 2012-08-09 DIAGNOSIS — N039 Chronic nephritic syndrome with unspecified morphologic changes: Secondary | ICD-10-CM | POA: Diagnosis not present

## 2012-08-09 DIAGNOSIS — N2581 Secondary hyperparathyroidism of renal origin: Secondary | ICD-10-CM | POA: Diagnosis not present

## 2012-08-11 DIAGNOSIS — Z23 Encounter for immunization: Secondary | ICD-10-CM | POA: Diagnosis not present

## 2012-08-11 DIAGNOSIS — D631 Anemia in chronic kidney disease: Secondary | ICD-10-CM | POA: Diagnosis not present

## 2012-08-11 DIAGNOSIS — J698 Pneumonitis due to inhalation of other solids and liquids: Secondary | ICD-10-CM | POA: Diagnosis not present

## 2012-08-11 DIAGNOSIS — N2581 Secondary hyperparathyroidism of renal origin: Secondary | ICD-10-CM | POA: Diagnosis not present

## 2012-08-11 DIAGNOSIS — J690 Pneumonitis due to inhalation of food and vomit: Secondary | ICD-10-CM | POA: Diagnosis not present

## 2012-08-11 DIAGNOSIS — N186 End stage renal disease: Secondary | ICD-10-CM | POA: Diagnosis not present

## 2012-08-14 DIAGNOSIS — J698 Pneumonitis due to inhalation of other solids and liquids: Secondary | ICD-10-CM | POA: Diagnosis not present

## 2012-08-14 DIAGNOSIS — N186 End stage renal disease: Secondary | ICD-10-CM | POA: Diagnosis not present

## 2012-08-14 DIAGNOSIS — N2581 Secondary hyperparathyroidism of renal origin: Secondary | ICD-10-CM | POA: Diagnosis not present

## 2012-08-14 DIAGNOSIS — D631 Anemia in chronic kidney disease: Secondary | ICD-10-CM | POA: Diagnosis not present

## 2012-08-14 DIAGNOSIS — Z23 Encounter for immunization: Secondary | ICD-10-CM | POA: Diagnosis not present

## 2012-08-14 DIAGNOSIS — J690 Pneumonitis due to inhalation of food and vomit: Secondary | ICD-10-CM | POA: Diagnosis not present

## 2012-08-16 DIAGNOSIS — N2581 Secondary hyperparathyroidism of renal origin: Secondary | ICD-10-CM | POA: Diagnosis not present

## 2012-08-16 DIAGNOSIS — N186 End stage renal disease: Secondary | ICD-10-CM | POA: Diagnosis not present

## 2012-08-18 DIAGNOSIS — N2581 Secondary hyperparathyroidism of renal origin: Secondary | ICD-10-CM | POA: Diagnosis not present

## 2012-08-18 DIAGNOSIS — J690 Pneumonitis due to inhalation of food and vomit: Secondary | ICD-10-CM | POA: Diagnosis not present

## 2012-08-18 DIAGNOSIS — Z23 Encounter for immunization: Secondary | ICD-10-CM | POA: Diagnosis not present

## 2012-08-18 DIAGNOSIS — J698 Pneumonitis due to inhalation of other solids and liquids: Secondary | ICD-10-CM | POA: Diagnosis not present

## 2012-08-18 DIAGNOSIS — D631 Anemia in chronic kidney disease: Secondary | ICD-10-CM | POA: Diagnosis not present

## 2012-08-18 DIAGNOSIS — N186 End stage renal disease: Secondary | ICD-10-CM | POA: Diagnosis not present

## 2012-08-21 DIAGNOSIS — N186 End stage renal disease: Secondary | ICD-10-CM | POA: Diagnosis not present

## 2012-08-21 DIAGNOSIS — D631 Anemia in chronic kidney disease: Secondary | ICD-10-CM | POA: Diagnosis not present

## 2012-08-21 DIAGNOSIS — J690 Pneumonitis due to inhalation of food and vomit: Secondary | ICD-10-CM | POA: Diagnosis not present

## 2012-08-21 DIAGNOSIS — N2581 Secondary hyperparathyroidism of renal origin: Secondary | ICD-10-CM | POA: Diagnosis not present

## 2012-08-21 DIAGNOSIS — J698 Pneumonitis due to inhalation of other solids and liquids: Secondary | ICD-10-CM | POA: Diagnosis not present

## 2012-08-21 DIAGNOSIS — Z23 Encounter for immunization: Secondary | ICD-10-CM | POA: Diagnosis not present

## 2012-08-23 DIAGNOSIS — Z23 Encounter for immunization: Secondary | ICD-10-CM | POA: Diagnosis not present

## 2012-08-23 DIAGNOSIS — J690 Pneumonitis due to inhalation of food and vomit: Secondary | ICD-10-CM | POA: Diagnosis not present

## 2012-08-23 DIAGNOSIS — N2581 Secondary hyperparathyroidism of renal origin: Secondary | ICD-10-CM | POA: Diagnosis not present

## 2012-08-23 DIAGNOSIS — J698 Pneumonitis due to inhalation of other solids and liquids: Secondary | ICD-10-CM | POA: Diagnosis not present

## 2012-08-23 DIAGNOSIS — D631 Anemia in chronic kidney disease: Secondary | ICD-10-CM | POA: Diagnosis not present

## 2012-08-23 DIAGNOSIS — N186 End stage renal disease: Secondary | ICD-10-CM | POA: Diagnosis not present

## 2012-08-25 DIAGNOSIS — N2581 Secondary hyperparathyroidism of renal origin: Secondary | ICD-10-CM | POA: Diagnosis not present

## 2012-08-25 DIAGNOSIS — J698 Pneumonitis due to inhalation of other solids and liquids: Secondary | ICD-10-CM | POA: Diagnosis not present

## 2012-08-25 DIAGNOSIS — N186 End stage renal disease: Secondary | ICD-10-CM | POA: Diagnosis not present

## 2012-08-25 DIAGNOSIS — Z23 Encounter for immunization: Secondary | ICD-10-CM | POA: Diagnosis not present

## 2012-08-25 DIAGNOSIS — J690 Pneumonitis due to inhalation of food and vomit: Secondary | ICD-10-CM | POA: Diagnosis not present

## 2012-08-25 DIAGNOSIS — N039 Chronic nephritic syndrome with unspecified morphologic changes: Secondary | ICD-10-CM | POA: Diagnosis not present

## 2012-08-27 ENCOUNTER — Ambulatory Visit: Payer: Medicare Other | Admitting: Gastroenterology

## 2012-08-27 DIAGNOSIS — N186 End stage renal disease: Secondary | ICD-10-CM | POA: Diagnosis not present

## 2012-08-27 NOTE — Progress Notes (Signed)
Patient ID: [name], female   DOB: 09-08-1945, 67 y.o.   MRN: [id number] Pt came in for her appointment but she did not know why she was here. I called the nursing home to see why she was here. They said the speech therapy said she was having some N/V and it might be reflex related, so then I told the Pt and she said that is not true and she did think she needed to been seen.

## 2012-08-27 NOTE — Progress Notes (Signed)
Patient ID: [name], female   DOB: [birth year], 67 y.o.   MRN: [id number]  Per staff, patient did not want to be seen today. Did not feel she was having any problems and per staff she is capable of making her own decisions. She was sent back to [facility] without being seen by provider.  Appointment was made for possible reflux but patient denies.

## 2012-08-28 DIAGNOSIS — S88119A Complete traumatic amputation at level between knee and ankle, unspecified lower leg, initial encounter: Secondary | ICD-10-CM | POA: Diagnosis not present

## 2012-08-28 DIAGNOSIS — T847XXA Infection and inflammatory reaction due to other internal orthopedic prosthetic devices, implants and grafts, initial encounter: Secondary | ICD-10-CM | POA: Diagnosis not present

## 2012-08-28 DIAGNOSIS — S72453A Displaced supracondylar fracture without intracondylar extension of lower end of unspecified femur, initial encounter for closed fracture: Secondary | ICD-10-CM | POA: Diagnosis not present

## 2012-08-28 DIAGNOSIS — I10 Essential (primary) hypertension: Secondary | ICD-10-CM | POA: Diagnosis not present

## 2012-08-28 DIAGNOSIS — N19 Unspecified kidney failure: Secondary | ICD-10-CM | POA: Diagnosis not present

## 2012-08-28 DIAGNOSIS — R262 Difficulty in walking, not elsewhere classified: Secondary | ICD-10-CM | POA: Diagnosis not present

## 2012-08-28 DIAGNOSIS — D509 Iron deficiency anemia, unspecified: Secondary | ICD-10-CM | POA: Diagnosis not present

## 2012-08-28 DIAGNOSIS — J698 Pneumonitis due to inhalation of other solids and liquids: Secondary | ICD-10-CM | POA: Diagnosis not present

## 2012-08-28 DIAGNOSIS — S7290XA Unspecified fracture of unspecified femur, initial encounter for closed fracture: Secondary | ICD-10-CM | POA: Diagnosis not present

## 2012-08-28 DIAGNOSIS — R279 Unspecified lack of coordination: Secondary | ICD-10-CM | POA: Diagnosis not present

## 2012-08-28 DIAGNOSIS — S72309A Unspecified fracture of shaft of unspecified femur, initial encounter for closed fracture: Secondary | ICD-10-CM | POA: Diagnosis not present

## 2012-08-28 DIAGNOSIS — R488 Other symbolic dysfunctions: Secondary | ICD-10-CM | POA: Diagnosis not present

## 2012-08-28 DIAGNOSIS — E876 Hypokalemia: Secondary | ICD-10-CM | POA: Diagnosis not present

## 2012-08-28 DIAGNOSIS — D631 Anemia in chronic kidney disease: Secondary | ICD-10-CM | POA: Diagnosis not present

## 2012-08-28 DIAGNOSIS — E1129 Type 2 diabetes mellitus with other diabetic kidney complication: Secondary | ICD-10-CM | POA: Diagnosis not present

## 2012-08-28 DIAGNOSIS — D649 Anemia, unspecified: Secondary | ICD-10-CM | POA: Diagnosis not present

## 2012-08-28 DIAGNOSIS — J690 Pneumonitis due to inhalation of food and vomit: Secondary | ICD-10-CM | POA: Diagnosis not present

## 2012-08-28 DIAGNOSIS — I96 Gangrene, not elsewhere classified: Secondary | ICD-10-CM | POA: Diagnosis not present

## 2012-08-28 DIAGNOSIS — R293 Abnormal posture: Secondary | ICD-10-CM | POA: Diagnosis not present

## 2012-08-28 DIAGNOSIS — N039 Chronic nephritic syndrome with unspecified morphologic changes: Secondary | ICD-10-CM | POA: Diagnosis not present

## 2012-08-28 DIAGNOSIS — IMO0001 Reserved for inherently not codable concepts without codable children: Secondary | ICD-10-CM | POA: Diagnosis not present

## 2012-08-28 DIAGNOSIS — M6281 Muscle weakness (generalized): Secondary | ICD-10-CM | POA: Diagnosis not present

## 2012-08-28 DIAGNOSIS — N186 End stage renal disease: Secondary | ICD-10-CM | POA: Diagnosis not present

## 2012-08-28 DIAGNOSIS — N2581 Secondary hyperparathyroidism of renal origin: Secondary | ICD-10-CM | POA: Diagnosis not present

## 2012-08-29 DIAGNOSIS — IMO0001 Reserved for inherently not codable concepts without codable children: Secondary | ICD-10-CM | POA: Diagnosis not present

## 2012-08-29 DIAGNOSIS — T847XXA Infection and inflammatory reaction due to other internal orthopedic prosthetic devices, implants and grafts, initial encounter: Secondary | ICD-10-CM | POA: Diagnosis not present

## 2012-08-29 DIAGNOSIS — S72309A Unspecified fracture of shaft of unspecified femur, initial encounter for closed fracture: Secondary | ICD-10-CM | POA: Diagnosis not present

## 2012-08-29 DIAGNOSIS — D649 Anemia, unspecified: Secondary | ICD-10-CM | POA: Diagnosis not present

## 2012-08-29 DIAGNOSIS — N19 Unspecified kidney failure: Secondary | ICD-10-CM | POA: Diagnosis not present

## 2012-08-29 DIAGNOSIS — S7290XA Unspecified fracture of unspecified femur, initial encounter for closed fracture: Secondary | ICD-10-CM | POA: Diagnosis not present

## 2012-08-30 DIAGNOSIS — J698 Pneumonitis due to inhalation of other solids and liquids: Secondary | ICD-10-CM | POA: Diagnosis not present

## 2012-08-30 DIAGNOSIS — N186 End stage renal disease: Secondary | ICD-10-CM | POA: Diagnosis not present

## 2012-08-30 DIAGNOSIS — J690 Pneumonitis due to inhalation of food and vomit: Secondary | ICD-10-CM | POA: Diagnosis not present

## 2012-08-30 DIAGNOSIS — N2581 Secondary hyperparathyroidism of renal origin: Secondary | ICD-10-CM | POA: Diagnosis not present

## 2012-08-30 DIAGNOSIS — N039 Chronic nephritic syndrome with unspecified morphologic changes: Secondary | ICD-10-CM | POA: Diagnosis not present

## 2012-08-30 DIAGNOSIS — D509 Iron deficiency anemia, unspecified: Secondary | ICD-10-CM | POA: Diagnosis not present

## 2012-09-01 DIAGNOSIS — N186 End stage renal disease: Secondary | ICD-10-CM | POA: Diagnosis not present

## 2012-09-01 DIAGNOSIS — N2581 Secondary hyperparathyroidism of renal origin: Secondary | ICD-10-CM | POA: Diagnosis not present

## 2012-09-01 DIAGNOSIS — N039 Chronic nephritic syndrome with unspecified morphologic changes: Secondary | ICD-10-CM | POA: Diagnosis not present

## 2012-09-01 DIAGNOSIS — D631 Anemia in chronic kidney disease: Secondary | ICD-10-CM | POA: Diagnosis not present

## 2012-09-01 DIAGNOSIS — J698 Pneumonitis due to inhalation of other solids and liquids: Secondary | ICD-10-CM | POA: Diagnosis not present

## 2012-09-01 DIAGNOSIS — D509 Iron deficiency anemia, unspecified: Secondary | ICD-10-CM | POA: Diagnosis not present

## 2012-09-01 DIAGNOSIS — J690 Pneumonitis due to inhalation of food and vomit: Secondary | ICD-10-CM | POA: Diagnosis not present

## 2012-09-04 DIAGNOSIS — J698 Pneumonitis due to inhalation of other solids and liquids: Secondary | ICD-10-CM | POA: Diagnosis not present

## 2012-09-04 DIAGNOSIS — J690 Pneumonitis due to inhalation of food and vomit: Secondary | ICD-10-CM | POA: Diagnosis not present

## 2012-09-04 DIAGNOSIS — D631 Anemia in chronic kidney disease: Secondary | ICD-10-CM | POA: Diagnosis not present

## 2012-09-04 DIAGNOSIS — D509 Iron deficiency anemia, unspecified: Secondary | ICD-10-CM | POA: Diagnosis not present

## 2012-09-04 DIAGNOSIS — N2581 Secondary hyperparathyroidism of renal origin: Secondary | ICD-10-CM | POA: Diagnosis not present

## 2012-09-04 DIAGNOSIS — N186 End stage renal disease: Secondary | ICD-10-CM | POA: Diagnosis not present

## 2012-09-06 DIAGNOSIS — N2581 Secondary hyperparathyroidism of renal origin: Secondary | ICD-10-CM | POA: Diagnosis not present

## 2012-09-06 DIAGNOSIS — N186 End stage renal disease: Secondary | ICD-10-CM | POA: Diagnosis not present

## 2012-09-06 DIAGNOSIS — N039 Chronic nephritic syndrome with unspecified morphologic changes: Secondary | ICD-10-CM | POA: Diagnosis not present

## 2012-09-06 DIAGNOSIS — J698 Pneumonitis due to inhalation of other solids and liquids: Secondary | ICD-10-CM | POA: Diagnosis not present

## 2012-09-06 DIAGNOSIS — J690 Pneumonitis due to inhalation of food and vomit: Secondary | ICD-10-CM | POA: Diagnosis not present

## 2012-09-06 DIAGNOSIS — D509 Iron deficiency anemia, unspecified: Secondary | ICD-10-CM | POA: Diagnosis not present

## 2012-09-08 DIAGNOSIS — N2581 Secondary hyperparathyroidism of renal origin: Secondary | ICD-10-CM | POA: Diagnosis not present

## 2012-09-08 DIAGNOSIS — N039 Chronic nephritic syndrome with unspecified morphologic changes: Secondary | ICD-10-CM | POA: Diagnosis not present

## 2012-09-08 DIAGNOSIS — D509 Iron deficiency anemia, unspecified: Secondary | ICD-10-CM | POA: Diagnosis not present

## 2012-09-08 DIAGNOSIS — N186 End stage renal disease: Secondary | ICD-10-CM | POA: Diagnosis not present

## 2012-09-08 DIAGNOSIS — J698 Pneumonitis due to inhalation of other solids and liquids: Secondary | ICD-10-CM | POA: Diagnosis not present

## 2012-09-08 DIAGNOSIS — J690 Pneumonitis due to inhalation of food and vomit: Secondary | ICD-10-CM | POA: Diagnosis not present

## 2012-09-11 DIAGNOSIS — J690 Pneumonitis due to inhalation of food and vomit: Secondary | ICD-10-CM | POA: Diagnosis not present

## 2012-09-11 DIAGNOSIS — N186 End stage renal disease: Secondary | ICD-10-CM | POA: Diagnosis not present

## 2012-09-11 DIAGNOSIS — N2581 Secondary hyperparathyroidism of renal origin: Secondary | ICD-10-CM | POA: Diagnosis not present

## 2012-09-11 DIAGNOSIS — D509 Iron deficiency anemia, unspecified: Secondary | ICD-10-CM | POA: Diagnosis not present

## 2012-09-11 DIAGNOSIS — N039 Chronic nephritic syndrome with unspecified morphologic changes: Secondary | ICD-10-CM | POA: Diagnosis not present

## 2012-09-11 DIAGNOSIS — J698 Pneumonitis due to inhalation of other solids and liquids: Secondary | ICD-10-CM | POA: Diagnosis not present

## 2012-09-13 DIAGNOSIS — D509 Iron deficiency anemia, unspecified: Secondary | ICD-10-CM | POA: Diagnosis not present

## 2012-09-13 DIAGNOSIS — N186 End stage renal disease: Secondary | ICD-10-CM | POA: Diagnosis not present

## 2012-09-13 DIAGNOSIS — D631 Anemia in chronic kidney disease: Secondary | ICD-10-CM | POA: Diagnosis not present

## 2012-09-13 DIAGNOSIS — J690 Pneumonitis due to inhalation of food and vomit: Secondary | ICD-10-CM | POA: Diagnosis not present

## 2012-09-13 DIAGNOSIS — J698 Pneumonitis due to inhalation of other solids and liquids: Secondary | ICD-10-CM | POA: Diagnosis not present

## 2012-09-13 DIAGNOSIS — N2581 Secondary hyperparathyroidism of renal origin: Secondary | ICD-10-CM | POA: Diagnosis not present

## 2012-09-15 DIAGNOSIS — N2581 Secondary hyperparathyroidism of renal origin: Secondary | ICD-10-CM | POA: Diagnosis not present

## 2012-09-15 DIAGNOSIS — N186 End stage renal disease: Secondary | ICD-10-CM | POA: Diagnosis not present

## 2012-09-15 DIAGNOSIS — N039 Chronic nephritic syndrome with unspecified morphologic changes: Secondary | ICD-10-CM | POA: Diagnosis not present

## 2012-09-15 DIAGNOSIS — D509 Iron deficiency anemia, unspecified: Secondary | ICD-10-CM | POA: Diagnosis not present

## 2012-09-15 DIAGNOSIS — J698 Pneumonitis due to inhalation of other solids and liquids: Secondary | ICD-10-CM | POA: Diagnosis not present

## 2012-09-15 DIAGNOSIS — J690 Pneumonitis due to inhalation of food and vomit: Secondary | ICD-10-CM | POA: Diagnosis not present

## 2012-09-18 DIAGNOSIS — N186 End stage renal disease: Secondary | ICD-10-CM | POA: Diagnosis not present

## 2012-09-18 DIAGNOSIS — J698 Pneumonitis due to inhalation of other solids and liquids: Secondary | ICD-10-CM | POA: Diagnosis not present

## 2012-09-18 DIAGNOSIS — N2581 Secondary hyperparathyroidism of renal origin: Secondary | ICD-10-CM | POA: Diagnosis not present

## 2012-09-18 DIAGNOSIS — J690 Pneumonitis due to inhalation of food and vomit: Secondary | ICD-10-CM | POA: Diagnosis not present

## 2012-09-18 DIAGNOSIS — N039 Chronic nephritic syndrome with unspecified morphologic changes: Secondary | ICD-10-CM | POA: Diagnosis not present

## 2012-09-18 DIAGNOSIS — D509 Iron deficiency anemia, unspecified: Secondary | ICD-10-CM | POA: Diagnosis not present

## 2012-09-20 DIAGNOSIS — D631 Anemia in chronic kidney disease: Secondary | ICD-10-CM | POA: Diagnosis not present

## 2012-09-20 DIAGNOSIS — N186 End stage renal disease: Secondary | ICD-10-CM | POA: Diagnosis not present

## 2012-09-20 DIAGNOSIS — J690 Pneumonitis due to inhalation of food and vomit: Secondary | ICD-10-CM | POA: Diagnosis not present

## 2012-09-20 DIAGNOSIS — J698 Pneumonitis due to inhalation of other solids and liquids: Secondary | ICD-10-CM | POA: Diagnosis not present

## 2012-09-20 DIAGNOSIS — D509 Iron deficiency anemia, unspecified: Secondary | ICD-10-CM | POA: Diagnosis not present

## 2012-09-20 DIAGNOSIS — N039 Chronic nephritic syndrome with unspecified morphologic changes: Secondary | ICD-10-CM | POA: Diagnosis not present

## 2012-09-20 DIAGNOSIS — N2581 Secondary hyperparathyroidism of renal origin: Secondary | ICD-10-CM | POA: Diagnosis not present

## 2012-09-22 DIAGNOSIS — N186 End stage renal disease: Secondary | ICD-10-CM | POA: Diagnosis not present

## 2012-09-22 DIAGNOSIS — J690 Pneumonitis due to inhalation of food and vomit: Secondary | ICD-10-CM | POA: Diagnosis not present

## 2012-09-22 DIAGNOSIS — D509 Iron deficiency anemia, unspecified: Secondary | ICD-10-CM | POA: Diagnosis not present

## 2012-09-22 DIAGNOSIS — N2581 Secondary hyperparathyroidism of renal origin: Secondary | ICD-10-CM | POA: Diagnosis not present

## 2012-09-22 DIAGNOSIS — J698 Pneumonitis due to inhalation of other solids and liquids: Secondary | ICD-10-CM | POA: Diagnosis not present

## 2012-09-22 DIAGNOSIS — N039 Chronic nephritic syndrome with unspecified morphologic changes: Secondary | ICD-10-CM | POA: Diagnosis not present

## 2012-09-25 DIAGNOSIS — N2581 Secondary hyperparathyroidism of renal origin: Secondary | ICD-10-CM | POA: Diagnosis not present

## 2012-09-25 DIAGNOSIS — J698 Pneumonitis due to inhalation of other solids and liquids: Secondary | ICD-10-CM | POA: Diagnosis not present

## 2012-09-25 DIAGNOSIS — D509 Iron deficiency anemia, unspecified: Secondary | ICD-10-CM | POA: Diagnosis not present

## 2012-09-25 DIAGNOSIS — J690 Pneumonitis due to inhalation of food and vomit: Secondary | ICD-10-CM | POA: Diagnosis not present

## 2012-09-25 DIAGNOSIS — D631 Anemia in chronic kidney disease: Secondary | ICD-10-CM | POA: Diagnosis not present

## 2012-09-25 DIAGNOSIS — N186 End stage renal disease: Secondary | ICD-10-CM | POA: Diagnosis not present

## 2012-09-26 DIAGNOSIS — D649 Anemia, unspecified: Secondary | ICD-10-CM | POA: Diagnosis not present

## 2012-09-26 DIAGNOSIS — S7290XA Unspecified fracture of unspecified femur, initial encounter for closed fracture: Secondary | ICD-10-CM | POA: Diagnosis not present

## 2012-09-26 DIAGNOSIS — N186 End stage renal disease: Secondary | ICD-10-CM | POA: Diagnosis not present

## 2012-09-26 DIAGNOSIS — N19 Unspecified kidney failure: Secondary | ICD-10-CM | POA: Diagnosis not present

## 2012-09-27 DIAGNOSIS — D509 Iron deficiency anemia, unspecified: Secondary | ICD-10-CM | POA: Diagnosis not present

## 2012-09-27 DIAGNOSIS — R262 Difficulty in walking, not elsewhere classified: Secondary | ICD-10-CM | POA: Diagnosis not present

## 2012-09-27 DIAGNOSIS — E1129 Type 2 diabetes mellitus with other diabetic kidney complication: Secondary | ICD-10-CM | POA: Diagnosis not present

## 2012-09-27 DIAGNOSIS — D631 Anemia in chronic kidney disease: Secondary | ICD-10-CM | POA: Diagnosis not present

## 2012-09-27 DIAGNOSIS — R488 Other symbolic dysfunctions: Secondary | ICD-10-CM | POA: Diagnosis not present

## 2012-09-27 DIAGNOSIS — R279 Unspecified lack of coordination: Secondary | ICD-10-CM | POA: Diagnosis not present

## 2012-09-27 DIAGNOSIS — E876 Hypokalemia: Secondary | ICD-10-CM | POA: Diagnosis not present

## 2012-09-27 DIAGNOSIS — J690 Pneumonitis due to inhalation of food and vomit: Secondary | ICD-10-CM | POA: Diagnosis not present

## 2012-09-27 DIAGNOSIS — B1910 Unspecified viral hepatitis B without hepatic coma: Secondary | ICD-10-CM | POA: Diagnosis not present

## 2012-09-27 DIAGNOSIS — N2581 Secondary hyperparathyroidism of renal origin: Secondary | ICD-10-CM | POA: Diagnosis not present

## 2012-09-27 DIAGNOSIS — N039 Chronic nephritic syndrome with unspecified morphologic changes: Secondary | ICD-10-CM | POA: Diagnosis not present

## 2012-09-27 DIAGNOSIS — M6281 Muscle weakness (generalized): Secondary | ICD-10-CM | POA: Diagnosis not present

## 2012-09-27 DIAGNOSIS — N186 End stage renal disease: Secondary | ICD-10-CM | POA: Diagnosis not present

## 2012-09-27 DIAGNOSIS — J698 Pneumonitis due to inhalation of other solids and liquids: Secondary | ICD-10-CM | POA: Diagnosis not present

## 2012-09-27 DIAGNOSIS — R293 Abnormal posture: Secondary | ICD-10-CM | POA: Diagnosis not present

## 2012-09-28 DIAGNOSIS — R262 Difficulty in walking, not elsewhere classified: Secondary | ICD-10-CM | POA: Diagnosis not present

## 2012-09-28 DIAGNOSIS — R488 Other symbolic dysfunctions: Secondary | ICD-10-CM | POA: Diagnosis not present

## 2012-09-28 DIAGNOSIS — R293 Abnormal posture: Secondary | ICD-10-CM | POA: Diagnosis not present

## 2012-09-28 DIAGNOSIS — M6281 Muscle weakness (generalized): Secondary | ICD-10-CM | POA: Diagnosis not present

## 2012-09-28 DIAGNOSIS — R279 Unspecified lack of coordination: Secondary | ICD-10-CM | POA: Diagnosis not present

## 2012-09-29 DIAGNOSIS — N039 Chronic nephritic syndrome with unspecified morphologic changes: Secondary | ICD-10-CM | POA: Diagnosis not present

## 2012-09-29 DIAGNOSIS — N2581 Secondary hyperparathyroidism of renal origin: Secondary | ICD-10-CM | POA: Diagnosis not present

## 2012-09-29 DIAGNOSIS — J690 Pneumonitis due to inhalation of food and vomit: Secondary | ICD-10-CM | POA: Diagnosis not present

## 2012-09-29 DIAGNOSIS — D509 Iron deficiency anemia, unspecified: Secondary | ICD-10-CM | POA: Diagnosis not present

## 2012-09-29 DIAGNOSIS — N186 End stage renal disease: Secondary | ICD-10-CM | POA: Diagnosis not present

## 2012-09-29 DIAGNOSIS — J698 Pneumonitis due to inhalation of other solids and liquids: Secondary | ICD-10-CM | POA: Diagnosis not present

## 2012-10-01 DIAGNOSIS — R279 Unspecified lack of coordination: Secondary | ICD-10-CM | POA: Diagnosis not present

## 2012-10-01 DIAGNOSIS — M79609 Pain in unspecified limb: Secondary | ICD-10-CM | POA: Diagnosis not present

## 2012-10-01 DIAGNOSIS — R262 Difficulty in walking, not elsewhere classified: Secondary | ICD-10-CM | POA: Diagnosis not present

## 2012-10-01 DIAGNOSIS — R293 Abnormal posture: Secondary | ICD-10-CM | POA: Diagnosis not present

## 2012-10-01 DIAGNOSIS — L03039 Cellulitis of unspecified toe: Secondary | ICD-10-CM | POA: Diagnosis not present

## 2012-10-01 DIAGNOSIS — L02619 Cutaneous abscess of unspecified foot: Secondary | ICD-10-CM | POA: Diagnosis not present

## 2012-10-01 DIAGNOSIS — M6281 Muscle weakness (generalized): Secondary | ICD-10-CM | POA: Diagnosis not present

## 2012-10-01 DIAGNOSIS — R488 Other symbolic dysfunctions: Secondary | ICD-10-CM | POA: Diagnosis not present

## 2012-10-02 DIAGNOSIS — I7389 Other specified peripheral vascular diseases: Secondary | ICD-10-CM | POA: Diagnosis not present

## 2012-10-02 DIAGNOSIS — R262 Difficulty in walking, not elsewhere classified: Secondary | ICD-10-CM | POA: Diagnosis not present

## 2012-10-02 DIAGNOSIS — N2581 Secondary hyperparathyroidism of renal origin: Secondary | ICD-10-CM | POA: Diagnosis not present

## 2012-10-02 DIAGNOSIS — M6281 Muscle weakness (generalized): Secondary | ICD-10-CM | POA: Diagnosis not present

## 2012-10-02 DIAGNOSIS — J698 Pneumonitis due to inhalation of other solids and liquids: Secondary | ICD-10-CM | POA: Diagnosis not present

## 2012-10-02 DIAGNOSIS — I7092 Chronic total occlusion of artery of the extremities: Secondary | ICD-10-CM | POA: Diagnosis not present

## 2012-10-02 DIAGNOSIS — R488 Other symbolic dysfunctions: Secondary | ICD-10-CM | POA: Diagnosis not present

## 2012-10-02 DIAGNOSIS — J690 Pneumonitis due to inhalation of food and vomit: Secondary | ICD-10-CM | POA: Diagnosis not present

## 2012-10-02 DIAGNOSIS — N039 Chronic nephritic syndrome with unspecified morphologic changes: Secondary | ICD-10-CM | POA: Diagnosis not present

## 2012-10-02 DIAGNOSIS — I739 Peripheral vascular disease, unspecified: Secondary | ICD-10-CM | POA: Diagnosis not present

## 2012-10-02 DIAGNOSIS — D509 Iron deficiency anemia, unspecified: Secondary | ICD-10-CM | POA: Diagnosis not present

## 2012-10-02 DIAGNOSIS — E1159 Type 2 diabetes mellitus with other circulatory complications: Secondary | ICD-10-CM | POA: Diagnosis not present

## 2012-10-02 DIAGNOSIS — R293 Abnormal posture: Secondary | ICD-10-CM | POA: Diagnosis not present

## 2012-10-02 DIAGNOSIS — R279 Unspecified lack of coordination: Secondary | ICD-10-CM | POA: Diagnosis not present

## 2012-10-02 DIAGNOSIS — N186 End stage renal disease: Secondary | ICD-10-CM | POA: Diagnosis not present

## 2012-10-03 DIAGNOSIS — M6281 Muscle weakness (generalized): Secondary | ICD-10-CM | POA: Diagnosis not present

## 2012-10-03 DIAGNOSIS — R262 Difficulty in walking, not elsewhere classified: Secondary | ICD-10-CM | POA: Diagnosis not present

## 2012-10-03 DIAGNOSIS — R279 Unspecified lack of coordination: Secondary | ICD-10-CM | POA: Diagnosis not present

## 2012-10-03 DIAGNOSIS — R293 Abnormal posture: Secondary | ICD-10-CM | POA: Diagnosis not present

## 2012-10-03 DIAGNOSIS — R488 Other symbolic dysfunctions: Secondary | ICD-10-CM | POA: Diagnosis not present

## 2012-10-04 DIAGNOSIS — D509 Iron deficiency anemia, unspecified: Secondary | ICD-10-CM | POA: Diagnosis not present

## 2012-10-04 DIAGNOSIS — M6281 Muscle weakness (generalized): Secondary | ICD-10-CM | POA: Diagnosis not present

## 2012-10-04 DIAGNOSIS — E1165 Type 2 diabetes mellitus with hyperglycemia: Secondary | ICD-10-CM | POA: Diagnosis not present

## 2012-10-04 DIAGNOSIS — R293 Abnormal posture: Secondary | ICD-10-CM | POA: Diagnosis not present

## 2012-10-04 DIAGNOSIS — N2581 Secondary hyperparathyroidism of renal origin: Secondary | ICD-10-CM | POA: Diagnosis not present

## 2012-10-04 DIAGNOSIS — R488 Other symbolic dysfunctions: Secondary | ICD-10-CM | POA: Diagnosis not present

## 2012-10-04 DIAGNOSIS — D631 Anemia in chronic kidney disease: Secondary | ICD-10-CM | POA: Diagnosis not present

## 2012-10-04 DIAGNOSIS — R279 Unspecified lack of coordination: Secondary | ICD-10-CM | POA: Diagnosis not present

## 2012-10-04 DIAGNOSIS — R262 Difficulty in walking, not elsewhere classified: Secondary | ICD-10-CM | POA: Diagnosis not present

## 2012-10-04 DIAGNOSIS — J690 Pneumonitis due to inhalation of food and vomit: Secondary | ICD-10-CM | POA: Diagnosis not present

## 2012-10-04 DIAGNOSIS — J698 Pneumonitis due to inhalation of other solids and liquids: Secondary | ICD-10-CM | POA: Diagnosis not present

## 2012-10-04 DIAGNOSIS — N186 End stage renal disease: Secondary | ICD-10-CM | POA: Diagnosis not present

## 2012-10-04 DIAGNOSIS — B351 Tinea unguium: Secondary | ICD-10-CM | POA: Diagnosis not present

## 2012-10-05 DIAGNOSIS — R279 Unspecified lack of coordination: Secondary | ICD-10-CM | POA: Diagnosis not present

## 2012-10-05 DIAGNOSIS — R262 Difficulty in walking, not elsewhere classified: Secondary | ICD-10-CM | POA: Diagnosis not present

## 2012-10-05 DIAGNOSIS — R293 Abnormal posture: Secondary | ICD-10-CM | POA: Diagnosis not present

## 2012-10-05 DIAGNOSIS — R488 Other symbolic dysfunctions: Secondary | ICD-10-CM | POA: Diagnosis not present

## 2012-10-05 DIAGNOSIS — M6281 Muscle weakness (generalized): Secondary | ICD-10-CM | POA: Diagnosis not present

## 2012-10-06 DIAGNOSIS — J690 Pneumonitis due to inhalation of food and vomit: Secondary | ICD-10-CM | POA: Diagnosis not present

## 2012-10-06 DIAGNOSIS — J698 Pneumonitis due to inhalation of other solids and liquids: Secondary | ICD-10-CM | POA: Diagnosis not present

## 2012-10-06 DIAGNOSIS — N186 End stage renal disease: Secondary | ICD-10-CM | POA: Diagnosis not present

## 2012-10-06 DIAGNOSIS — N039 Chronic nephritic syndrome with unspecified morphologic changes: Secondary | ICD-10-CM | POA: Diagnosis not present

## 2012-10-06 DIAGNOSIS — D509 Iron deficiency anemia, unspecified: Secondary | ICD-10-CM | POA: Diagnosis not present

## 2012-10-06 DIAGNOSIS — N2581 Secondary hyperparathyroidism of renal origin: Secondary | ICD-10-CM | POA: Diagnosis not present

## 2012-10-08 DIAGNOSIS — M6281 Muscle weakness (generalized): Secondary | ICD-10-CM | POA: Diagnosis not present

## 2012-10-08 DIAGNOSIS — R279 Unspecified lack of coordination: Secondary | ICD-10-CM | POA: Diagnosis not present

## 2012-10-08 DIAGNOSIS — L02619 Cutaneous abscess of unspecified foot: Secondary | ICD-10-CM | POA: Diagnosis not present

## 2012-10-08 DIAGNOSIS — L03039 Cellulitis of unspecified toe: Secondary | ICD-10-CM | POA: Diagnosis not present

## 2012-10-08 DIAGNOSIS — R488 Other symbolic dysfunctions: Secondary | ICD-10-CM | POA: Diagnosis not present

## 2012-10-08 DIAGNOSIS — R262 Difficulty in walking, not elsewhere classified: Secondary | ICD-10-CM | POA: Diagnosis not present

## 2012-10-08 DIAGNOSIS — R293 Abnormal posture: Secondary | ICD-10-CM | POA: Diagnosis not present

## 2012-10-09 DIAGNOSIS — N2581 Secondary hyperparathyroidism of renal origin: Secondary | ICD-10-CM | POA: Diagnosis not present

## 2012-10-09 DIAGNOSIS — R488 Other symbolic dysfunctions: Secondary | ICD-10-CM | POA: Diagnosis not present

## 2012-10-09 DIAGNOSIS — J690 Pneumonitis due to inhalation of food and vomit: Secondary | ICD-10-CM | POA: Diagnosis not present

## 2012-10-09 DIAGNOSIS — R262 Difficulty in walking, not elsewhere classified: Secondary | ICD-10-CM | POA: Diagnosis not present

## 2012-10-09 DIAGNOSIS — R279 Unspecified lack of coordination: Secondary | ICD-10-CM | POA: Diagnosis not present

## 2012-10-09 DIAGNOSIS — D631 Anemia in chronic kidney disease: Secondary | ICD-10-CM | POA: Diagnosis not present

## 2012-10-09 DIAGNOSIS — N186 End stage renal disease: Secondary | ICD-10-CM | POA: Diagnosis not present

## 2012-10-09 DIAGNOSIS — J698 Pneumonitis due to inhalation of other solids and liquids: Secondary | ICD-10-CM | POA: Diagnosis not present

## 2012-10-09 DIAGNOSIS — R293 Abnormal posture: Secondary | ICD-10-CM | POA: Diagnosis not present

## 2012-10-09 DIAGNOSIS — M6281 Muscle weakness (generalized): Secondary | ICD-10-CM | POA: Diagnosis not present

## 2012-10-09 DIAGNOSIS — D509 Iron deficiency anemia, unspecified: Secondary | ICD-10-CM | POA: Diagnosis not present

## 2012-10-10 DIAGNOSIS — S72309A Unspecified fracture of shaft of unspecified femur, initial encounter for closed fracture: Secondary | ICD-10-CM | POA: Diagnosis not present

## 2012-10-10 DIAGNOSIS — IMO0001 Reserved for inherently not codable concepts without codable children: Secondary | ICD-10-CM | POA: Diagnosis not present

## 2012-10-10 DIAGNOSIS — T847XXA Infection and inflammatory reaction due to other internal orthopedic prosthetic devices, implants and grafts, initial encounter: Secondary | ICD-10-CM | POA: Diagnosis not present

## 2012-10-10 DIAGNOSIS — R488 Other symbolic dysfunctions: Secondary | ICD-10-CM | POA: Diagnosis not present

## 2012-10-10 DIAGNOSIS — R293 Abnormal posture: Secondary | ICD-10-CM | POA: Diagnosis not present

## 2012-10-10 DIAGNOSIS — M6281 Muscle weakness (generalized): Secondary | ICD-10-CM | POA: Diagnosis not present

## 2012-10-10 DIAGNOSIS — R262 Difficulty in walking, not elsewhere classified: Secondary | ICD-10-CM | POA: Diagnosis not present

## 2012-10-10 DIAGNOSIS — R279 Unspecified lack of coordination: Secondary | ICD-10-CM | POA: Diagnosis not present

## 2012-10-11 DIAGNOSIS — R488 Other symbolic dysfunctions: Secondary | ICD-10-CM | POA: Diagnosis not present

## 2012-10-11 DIAGNOSIS — J690 Pneumonitis due to inhalation of food and vomit: Secondary | ICD-10-CM | POA: Diagnosis not present

## 2012-10-11 DIAGNOSIS — J698 Pneumonitis due to inhalation of other solids and liquids: Secondary | ICD-10-CM | POA: Diagnosis not present

## 2012-10-11 DIAGNOSIS — N039 Chronic nephritic syndrome with unspecified morphologic changes: Secondary | ICD-10-CM | POA: Diagnosis not present

## 2012-10-11 DIAGNOSIS — N2581 Secondary hyperparathyroidism of renal origin: Secondary | ICD-10-CM | POA: Diagnosis not present

## 2012-10-11 DIAGNOSIS — R293 Abnormal posture: Secondary | ICD-10-CM | POA: Diagnosis not present

## 2012-10-11 DIAGNOSIS — R279 Unspecified lack of coordination: Secondary | ICD-10-CM | POA: Diagnosis not present

## 2012-10-11 DIAGNOSIS — M6281 Muscle weakness (generalized): Secondary | ICD-10-CM | POA: Diagnosis not present

## 2012-10-11 DIAGNOSIS — D509 Iron deficiency anemia, unspecified: Secondary | ICD-10-CM | POA: Diagnosis not present

## 2012-10-11 DIAGNOSIS — N186 End stage renal disease: Secondary | ICD-10-CM | POA: Diagnosis not present

## 2012-10-11 DIAGNOSIS — R262 Difficulty in walking, not elsewhere classified: Secondary | ICD-10-CM | POA: Diagnosis not present

## 2012-10-12 DIAGNOSIS — R293 Abnormal posture: Secondary | ICD-10-CM | POA: Diagnosis not present

## 2012-10-12 DIAGNOSIS — M6281 Muscle weakness (generalized): Secondary | ICD-10-CM | POA: Diagnosis not present

## 2012-10-12 DIAGNOSIS — R262 Difficulty in walking, not elsewhere classified: Secondary | ICD-10-CM | POA: Diagnosis not present

## 2012-10-12 DIAGNOSIS — R488 Other symbolic dysfunctions: Secondary | ICD-10-CM | POA: Diagnosis not present

## 2012-10-12 DIAGNOSIS — R279 Unspecified lack of coordination: Secondary | ICD-10-CM | POA: Diagnosis not present

## 2012-10-13 DIAGNOSIS — J690 Pneumonitis due to inhalation of food and vomit: Secondary | ICD-10-CM | POA: Diagnosis not present

## 2012-10-13 DIAGNOSIS — N186 End stage renal disease: Secondary | ICD-10-CM | POA: Diagnosis not present

## 2012-10-13 DIAGNOSIS — D631 Anemia in chronic kidney disease: Secondary | ICD-10-CM | POA: Diagnosis not present

## 2012-10-13 DIAGNOSIS — N2581 Secondary hyperparathyroidism of renal origin: Secondary | ICD-10-CM | POA: Diagnosis not present

## 2012-10-13 DIAGNOSIS — J698 Pneumonitis due to inhalation of other solids and liquids: Secondary | ICD-10-CM | POA: Diagnosis not present

## 2012-10-13 DIAGNOSIS — D509 Iron deficiency anemia, unspecified: Secondary | ICD-10-CM | POA: Diagnosis not present

## 2012-10-15 DIAGNOSIS — M6281 Muscle weakness (generalized): Secondary | ICD-10-CM | POA: Diagnosis not present

## 2012-10-15 DIAGNOSIS — R488 Other symbolic dysfunctions: Secondary | ICD-10-CM | POA: Diagnosis not present

## 2012-10-15 DIAGNOSIS — R279 Unspecified lack of coordination: Secondary | ICD-10-CM | POA: Diagnosis not present

## 2012-10-15 DIAGNOSIS — R293 Abnormal posture: Secondary | ICD-10-CM | POA: Diagnosis not present

## 2012-10-15 DIAGNOSIS — R262 Difficulty in walking, not elsewhere classified: Secondary | ICD-10-CM | POA: Diagnosis not present

## 2012-10-16 DIAGNOSIS — J698 Pneumonitis due to inhalation of other solids and liquids: Secondary | ICD-10-CM | POA: Diagnosis not present

## 2012-10-16 DIAGNOSIS — D509 Iron deficiency anemia, unspecified: Secondary | ICD-10-CM | POA: Diagnosis not present

## 2012-10-16 DIAGNOSIS — M6281 Muscle weakness (generalized): Secondary | ICD-10-CM | POA: Diagnosis not present

## 2012-10-16 DIAGNOSIS — R488 Other symbolic dysfunctions: Secondary | ICD-10-CM | POA: Diagnosis not present

## 2012-10-16 DIAGNOSIS — N186 End stage renal disease: Secondary | ICD-10-CM | POA: Diagnosis not present

## 2012-10-16 DIAGNOSIS — R262 Difficulty in walking, not elsewhere classified: Secondary | ICD-10-CM | POA: Diagnosis not present

## 2012-10-16 DIAGNOSIS — R293 Abnormal posture: Secondary | ICD-10-CM | POA: Diagnosis not present

## 2012-10-16 DIAGNOSIS — D631 Anemia in chronic kidney disease: Secondary | ICD-10-CM | POA: Diagnosis not present

## 2012-10-16 DIAGNOSIS — R279 Unspecified lack of coordination: Secondary | ICD-10-CM | POA: Diagnosis not present

## 2012-10-16 DIAGNOSIS — J690 Pneumonitis due to inhalation of food and vomit: Secondary | ICD-10-CM | POA: Diagnosis not present

## 2012-10-16 DIAGNOSIS — N2581 Secondary hyperparathyroidism of renal origin: Secondary | ICD-10-CM | POA: Diagnosis not present

## 2012-10-17 ENCOUNTER — Other Ambulatory Visit: Payer: Self-pay | Admitting: *Deleted

## 2012-10-17 DIAGNOSIS — R488 Other symbolic dysfunctions: Secondary | ICD-10-CM | POA: Diagnosis not present

## 2012-10-17 DIAGNOSIS — M6281 Muscle weakness (generalized): Secondary | ICD-10-CM | POA: Diagnosis not present

## 2012-10-17 DIAGNOSIS — Z01810 Encounter for preprocedural cardiovascular examination: Secondary | ICD-10-CM

## 2012-10-17 DIAGNOSIS — N186 End stage renal disease: Secondary | ICD-10-CM

## 2012-10-17 DIAGNOSIS — R262 Difficulty in walking, not elsewhere classified: Secondary | ICD-10-CM | POA: Diagnosis not present

## 2012-10-17 DIAGNOSIS — R279 Unspecified lack of coordination: Secondary | ICD-10-CM | POA: Diagnosis not present

## 2012-10-17 DIAGNOSIS — R293 Abnormal posture: Secondary | ICD-10-CM | POA: Diagnosis not present

## 2012-10-18 DIAGNOSIS — R293 Abnormal posture: Secondary | ICD-10-CM | POA: Diagnosis not present

## 2012-10-18 DIAGNOSIS — R262 Difficulty in walking, not elsewhere classified: Secondary | ICD-10-CM | POA: Diagnosis not present

## 2012-10-18 DIAGNOSIS — J690 Pneumonitis due to inhalation of food and vomit: Secondary | ICD-10-CM | POA: Diagnosis not present

## 2012-10-18 DIAGNOSIS — M6281 Muscle weakness (generalized): Secondary | ICD-10-CM | POA: Diagnosis not present

## 2012-10-18 DIAGNOSIS — N186 End stage renal disease: Secondary | ICD-10-CM | POA: Diagnosis not present

## 2012-10-18 DIAGNOSIS — J698 Pneumonitis due to inhalation of other solids and liquids: Secondary | ICD-10-CM | POA: Diagnosis not present

## 2012-10-18 DIAGNOSIS — D509 Iron deficiency anemia, unspecified: Secondary | ICD-10-CM | POA: Diagnosis not present

## 2012-10-18 DIAGNOSIS — N2581 Secondary hyperparathyroidism of renal origin: Secondary | ICD-10-CM | POA: Diagnosis not present

## 2012-10-18 DIAGNOSIS — R488 Other symbolic dysfunctions: Secondary | ICD-10-CM | POA: Diagnosis not present

## 2012-10-18 DIAGNOSIS — D631 Anemia in chronic kidney disease: Secondary | ICD-10-CM | POA: Diagnosis not present

## 2012-10-18 DIAGNOSIS — R279 Unspecified lack of coordination: Secondary | ICD-10-CM | POA: Diagnosis not present

## 2012-10-19 DIAGNOSIS — M6281 Muscle weakness (generalized): Secondary | ICD-10-CM | POA: Diagnosis not present

## 2012-10-19 DIAGNOSIS — R293 Abnormal posture: Secondary | ICD-10-CM | POA: Diagnosis not present

## 2012-10-19 DIAGNOSIS — R279 Unspecified lack of coordination: Secondary | ICD-10-CM | POA: Diagnosis not present

## 2012-10-19 DIAGNOSIS — R488 Other symbolic dysfunctions: Secondary | ICD-10-CM | POA: Diagnosis not present

## 2012-10-19 DIAGNOSIS — R262 Difficulty in walking, not elsewhere classified: Secondary | ICD-10-CM | POA: Diagnosis not present

## 2012-10-20 DIAGNOSIS — J690 Pneumonitis due to inhalation of food and vomit: Secondary | ICD-10-CM | POA: Diagnosis not present

## 2012-10-20 DIAGNOSIS — D631 Anemia in chronic kidney disease: Secondary | ICD-10-CM | POA: Diagnosis not present

## 2012-10-20 DIAGNOSIS — J698 Pneumonitis due to inhalation of other solids and liquids: Secondary | ICD-10-CM | POA: Diagnosis not present

## 2012-10-20 DIAGNOSIS — N2581 Secondary hyperparathyroidism of renal origin: Secondary | ICD-10-CM | POA: Diagnosis not present

## 2012-10-20 DIAGNOSIS — N186 End stage renal disease: Secondary | ICD-10-CM | POA: Diagnosis not present

## 2012-10-20 DIAGNOSIS — D509 Iron deficiency anemia, unspecified: Secondary | ICD-10-CM | POA: Diagnosis not present

## 2012-10-22 DIAGNOSIS — R279 Unspecified lack of coordination: Secondary | ICD-10-CM | POA: Diagnosis not present

## 2012-10-22 DIAGNOSIS — M6281 Muscle weakness (generalized): Secondary | ICD-10-CM | POA: Diagnosis not present

## 2012-10-22 DIAGNOSIS — R488 Other symbolic dysfunctions: Secondary | ICD-10-CM | POA: Diagnosis not present

## 2012-10-22 DIAGNOSIS — R293 Abnormal posture: Secondary | ICD-10-CM | POA: Diagnosis not present

## 2012-10-22 DIAGNOSIS — R262 Difficulty in walking, not elsewhere classified: Secondary | ICD-10-CM | POA: Diagnosis not present

## 2012-10-23 DIAGNOSIS — D509 Iron deficiency anemia, unspecified: Secondary | ICD-10-CM | POA: Diagnosis not present

## 2012-10-23 DIAGNOSIS — J698 Pneumonitis due to inhalation of other solids and liquids: Secondary | ICD-10-CM | POA: Diagnosis not present

## 2012-10-23 DIAGNOSIS — R262 Difficulty in walking, not elsewhere classified: Secondary | ICD-10-CM | POA: Diagnosis not present

## 2012-10-23 DIAGNOSIS — N039 Chronic nephritic syndrome with unspecified morphologic changes: Secondary | ICD-10-CM | POA: Diagnosis not present

## 2012-10-23 DIAGNOSIS — R279 Unspecified lack of coordination: Secondary | ICD-10-CM | POA: Diagnosis not present

## 2012-10-23 DIAGNOSIS — M6281 Muscle weakness (generalized): Secondary | ICD-10-CM | POA: Diagnosis not present

## 2012-10-23 DIAGNOSIS — N186 End stage renal disease: Secondary | ICD-10-CM | POA: Diagnosis not present

## 2012-10-23 DIAGNOSIS — R488 Other symbolic dysfunctions: Secondary | ICD-10-CM | POA: Diagnosis not present

## 2012-10-23 DIAGNOSIS — N2581 Secondary hyperparathyroidism of renal origin: Secondary | ICD-10-CM | POA: Diagnosis not present

## 2012-10-23 DIAGNOSIS — R293 Abnormal posture: Secondary | ICD-10-CM | POA: Diagnosis not present

## 2012-10-23 DIAGNOSIS — J690 Pneumonitis due to inhalation of food and vomit: Secondary | ICD-10-CM | POA: Diagnosis not present

## 2012-10-24 DIAGNOSIS — N19 Unspecified kidney failure: Secondary | ICD-10-CM | POA: Diagnosis not present

## 2012-10-24 DIAGNOSIS — R293 Abnormal posture: Secondary | ICD-10-CM | POA: Diagnosis not present

## 2012-10-24 DIAGNOSIS — R279 Unspecified lack of coordination: Secondary | ICD-10-CM | POA: Diagnosis not present

## 2012-10-24 DIAGNOSIS — D649 Anemia, unspecified: Secondary | ICD-10-CM | POA: Diagnosis not present

## 2012-10-24 DIAGNOSIS — M6281 Muscle weakness (generalized): Secondary | ICD-10-CM | POA: Diagnosis not present

## 2012-10-24 DIAGNOSIS — S7290XA Unspecified fracture of unspecified femur, initial encounter for closed fracture: Secondary | ICD-10-CM | POA: Diagnosis not present

## 2012-10-24 DIAGNOSIS — R488 Other symbolic dysfunctions: Secondary | ICD-10-CM | POA: Diagnosis not present

## 2012-10-24 DIAGNOSIS — R262 Difficulty in walking, not elsewhere classified: Secondary | ICD-10-CM | POA: Diagnosis not present

## 2012-10-25 DIAGNOSIS — D509 Iron deficiency anemia, unspecified: Secondary | ICD-10-CM | POA: Diagnosis not present

## 2012-10-25 DIAGNOSIS — J690 Pneumonitis due to inhalation of food and vomit: Secondary | ICD-10-CM | POA: Diagnosis not present

## 2012-10-25 DIAGNOSIS — R262 Difficulty in walking, not elsewhere classified: Secondary | ICD-10-CM | POA: Diagnosis not present

## 2012-10-25 DIAGNOSIS — J698 Pneumonitis due to inhalation of other solids and liquids: Secondary | ICD-10-CM | POA: Diagnosis not present

## 2012-10-25 DIAGNOSIS — R488 Other symbolic dysfunctions: Secondary | ICD-10-CM | POA: Diagnosis not present

## 2012-10-25 DIAGNOSIS — N2581 Secondary hyperparathyroidism of renal origin: Secondary | ICD-10-CM | POA: Diagnosis not present

## 2012-10-25 DIAGNOSIS — R293 Abnormal posture: Secondary | ICD-10-CM | POA: Diagnosis not present

## 2012-10-25 DIAGNOSIS — R279 Unspecified lack of coordination: Secondary | ICD-10-CM | POA: Diagnosis not present

## 2012-10-25 DIAGNOSIS — N039 Chronic nephritic syndrome with unspecified morphologic changes: Secondary | ICD-10-CM | POA: Diagnosis not present

## 2012-10-25 DIAGNOSIS — N186 End stage renal disease: Secondary | ICD-10-CM | POA: Diagnosis not present

## 2012-10-25 DIAGNOSIS — M6281 Muscle weakness (generalized): Secondary | ICD-10-CM | POA: Diagnosis not present

## 2012-10-26 DIAGNOSIS — R488 Other symbolic dysfunctions: Secondary | ICD-10-CM | POA: Diagnosis not present

## 2012-10-26 DIAGNOSIS — R262 Difficulty in walking, not elsewhere classified: Secondary | ICD-10-CM | POA: Diagnosis not present

## 2012-10-26 DIAGNOSIS — M6281 Muscle weakness (generalized): Secondary | ICD-10-CM | POA: Diagnosis not present

## 2012-10-26 DIAGNOSIS — R279 Unspecified lack of coordination: Secondary | ICD-10-CM | POA: Diagnosis not present

## 2012-10-26 DIAGNOSIS — R293 Abnormal posture: Secondary | ICD-10-CM | POA: Diagnosis not present

## 2012-10-27 DIAGNOSIS — D509 Iron deficiency anemia, unspecified: Secondary | ICD-10-CM | POA: Diagnosis not present

## 2012-10-27 DIAGNOSIS — N2581 Secondary hyperparathyroidism of renal origin: Secondary | ICD-10-CM | POA: Diagnosis not present

## 2012-10-27 DIAGNOSIS — N186 End stage renal disease: Secondary | ICD-10-CM | POA: Diagnosis not present

## 2012-10-27 DIAGNOSIS — J698 Pneumonitis due to inhalation of other solids and liquids: Secondary | ICD-10-CM | POA: Diagnosis not present

## 2012-10-27 DIAGNOSIS — J690 Pneumonitis due to inhalation of food and vomit: Secondary | ICD-10-CM | POA: Diagnosis not present

## 2012-10-27 DIAGNOSIS — N039 Chronic nephritic syndrome with unspecified morphologic changes: Secondary | ICD-10-CM | POA: Diagnosis not present

## 2012-10-29 DIAGNOSIS — M6281 Muscle weakness (generalized): Secondary | ICD-10-CM | POA: Diagnosis not present

## 2012-10-29 DIAGNOSIS — R262 Difficulty in walking, not elsewhere classified: Secondary | ICD-10-CM | POA: Diagnosis not present

## 2012-10-29 DIAGNOSIS — R279 Unspecified lack of coordination: Secondary | ICD-10-CM | POA: Diagnosis not present

## 2012-10-29 DIAGNOSIS — R293 Abnormal posture: Secondary | ICD-10-CM | POA: Diagnosis not present

## 2012-10-29 DIAGNOSIS — R488 Other symbolic dysfunctions: Secondary | ICD-10-CM | POA: Diagnosis not present

## 2012-10-30 DIAGNOSIS — R262 Difficulty in walking, not elsewhere classified: Secondary | ICD-10-CM | POA: Diagnosis not present

## 2012-10-30 DIAGNOSIS — R488 Other symbolic dysfunctions: Secondary | ICD-10-CM | POA: Diagnosis not present

## 2012-10-30 DIAGNOSIS — M6281 Muscle weakness (generalized): Secondary | ICD-10-CM | POA: Diagnosis not present

## 2012-10-30 DIAGNOSIS — D509 Iron deficiency anemia, unspecified: Secondary | ICD-10-CM | POA: Diagnosis not present

## 2012-10-30 DIAGNOSIS — R279 Unspecified lack of coordination: Secondary | ICD-10-CM | POA: Diagnosis not present

## 2012-10-30 DIAGNOSIS — N186 End stage renal disease: Secondary | ICD-10-CM | POA: Diagnosis not present

## 2012-10-30 DIAGNOSIS — E876 Hypokalemia: Secondary | ICD-10-CM | POA: Diagnosis not present

## 2012-10-30 DIAGNOSIS — N2581 Secondary hyperparathyroidism of renal origin: Secondary | ICD-10-CM | POA: Diagnosis not present

## 2012-10-30 DIAGNOSIS — J690 Pneumonitis due to inhalation of food and vomit: Secondary | ICD-10-CM | POA: Diagnosis not present

## 2012-10-30 DIAGNOSIS — D631 Anemia in chronic kidney disease: Secondary | ICD-10-CM | POA: Diagnosis not present

## 2012-10-30 DIAGNOSIS — R293 Abnormal posture: Secondary | ICD-10-CM | POA: Diagnosis not present

## 2012-10-30 DIAGNOSIS — J698 Pneumonitis due to inhalation of other solids and liquids: Secondary | ICD-10-CM | POA: Diagnosis not present

## 2012-10-31 DIAGNOSIS — M6281 Muscle weakness (generalized): Secondary | ICD-10-CM | POA: Diagnosis not present

## 2012-10-31 DIAGNOSIS — R262 Difficulty in walking, not elsewhere classified: Secondary | ICD-10-CM | POA: Diagnosis not present

## 2012-10-31 DIAGNOSIS — R279 Unspecified lack of coordination: Secondary | ICD-10-CM | POA: Diagnosis not present

## 2012-10-31 DIAGNOSIS — R293 Abnormal posture: Secondary | ICD-10-CM | POA: Diagnosis not present

## 2012-10-31 DIAGNOSIS — R488 Other symbolic dysfunctions: Secondary | ICD-10-CM | POA: Diagnosis not present

## 2012-11-01 DIAGNOSIS — R262 Difficulty in walking, not elsewhere classified: Secondary | ICD-10-CM | POA: Diagnosis not present

## 2012-11-01 DIAGNOSIS — M6281 Muscle weakness (generalized): Secondary | ICD-10-CM | POA: Diagnosis not present

## 2012-11-01 DIAGNOSIS — R279 Unspecified lack of coordination: Secondary | ICD-10-CM | POA: Diagnosis not present

## 2012-11-01 DIAGNOSIS — J690 Pneumonitis due to inhalation of food and vomit: Secondary | ICD-10-CM | POA: Diagnosis not present

## 2012-11-01 DIAGNOSIS — R293 Abnormal posture: Secondary | ICD-10-CM | POA: Diagnosis not present

## 2012-11-01 DIAGNOSIS — N2581 Secondary hyperparathyroidism of renal origin: Secondary | ICD-10-CM | POA: Diagnosis not present

## 2012-11-01 DIAGNOSIS — N039 Chronic nephritic syndrome with unspecified morphologic changes: Secondary | ICD-10-CM | POA: Diagnosis not present

## 2012-11-01 DIAGNOSIS — J698 Pneumonitis due to inhalation of other solids and liquids: Secondary | ICD-10-CM | POA: Diagnosis not present

## 2012-11-01 DIAGNOSIS — D509 Iron deficiency anemia, unspecified: Secondary | ICD-10-CM | POA: Diagnosis not present

## 2012-11-01 DIAGNOSIS — N186 End stage renal disease: Secondary | ICD-10-CM | POA: Diagnosis not present

## 2012-11-01 DIAGNOSIS — R488 Other symbolic dysfunctions: Secondary | ICD-10-CM | POA: Diagnosis not present

## 2012-11-02 DIAGNOSIS — R488 Other symbolic dysfunctions: Secondary | ICD-10-CM | POA: Diagnosis not present

## 2012-11-02 DIAGNOSIS — R262 Difficulty in walking, not elsewhere classified: Secondary | ICD-10-CM | POA: Diagnosis not present

## 2012-11-02 DIAGNOSIS — R279 Unspecified lack of coordination: Secondary | ICD-10-CM | POA: Diagnosis not present

## 2012-11-02 DIAGNOSIS — M6281 Muscle weakness (generalized): Secondary | ICD-10-CM | POA: Diagnosis not present

## 2012-11-02 DIAGNOSIS — R293 Abnormal posture: Secondary | ICD-10-CM | POA: Diagnosis not present

## 2012-11-03 DIAGNOSIS — N039 Chronic nephritic syndrome with unspecified morphologic changes: Secondary | ICD-10-CM | POA: Diagnosis not present

## 2012-11-03 DIAGNOSIS — N2581 Secondary hyperparathyroidism of renal origin: Secondary | ICD-10-CM | POA: Diagnosis not present

## 2012-11-03 DIAGNOSIS — N186 End stage renal disease: Secondary | ICD-10-CM | POA: Diagnosis not present

## 2012-11-03 DIAGNOSIS — D509 Iron deficiency anemia, unspecified: Secondary | ICD-10-CM | POA: Diagnosis not present

## 2012-11-03 DIAGNOSIS — J690 Pneumonitis due to inhalation of food and vomit: Secondary | ICD-10-CM | POA: Diagnosis not present

## 2012-11-03 DIAGNOSIS — J698 Pneumonitis due to inhalation of other solids and liquids: Secondary | ICD-10-CM | POA: Diagnosis not present

## 2012-11-05 DIAGNOSIS — R488 Other symbolic dysfunctions: Secondary | ICD-10-CM | POA: Diagnosis not present

## 2012-11-05 DIAGNOSIS — M6281 Muscle weakness (generalized): Secondary | ICD-10-CM | POA: Diagnosis not present

## 2012-11-05 DIAGNOSIS — R293 Abnormal posture: Secondary | ICD-10-CM | POA: Diagnosis not present

## 2012-11-05 DIAGNOSIS — R262 Difficulty in walking, not elsewhere classified: Secondary | ICD-10-CM | POA: Diagnosis not present

## 2012-11-05 DIAGNOSIS — R279 Unspecified lack of coordination: Secondary | ICD-10-CM | POA: Diagnosis not present

## 2012-11-06 DIAGNOSIS — N2581 Secondary hyperparathyroidism of renal origin: Secondary | ICD-10-CM | POA: Diagnosis not present

## 2012-11-06 DIAGNOSIS — D509 Iron deficiency anemia, unspecified: Secondary | ICD-10-CM | POA: Diagnosis not present

## 2012-11-06 DIAGNOSIS — R293 Abnormal posture: Secondary | ICD-10-CM | POA: Diagnosis not present

## 2012-11-06 DIAGNOSIS — J698 Pneumonitis due to inhalation of other solids and liquids: Secondary | ICD-10-CM | POA: Diagnosis not present

## 2012-11-06 DIAGNOSIS — R279 Unspecified lack of coordination: Secondary | ICD-10-CM | POA: Diagnosis not present

## 2012-11-06 DIAGNOSIS — J690 Pneumonitis due to inhalation of food and vomit: Secondary | ICD-10-CM | POA: Diagnosis not present

## 2012-11-06 DIAGNOSIS — N186 End stage renal disease: Secondary | ICD-10-CM | POA: Diagnosis not present

## 2012-11-06 DIAGNOSIS — D631 Anemia in chronic kidney disease: Secondary | ICD-10-CM | POA: Diagnosis not present

## 2012-11-06 DIAGNOSIS — M6281 Muscle weakness (generalized): Secondary | ICD-10-CM | POA: Diagnosis not present

## 2012-11-06 DIAGNOSIS — R488 Other symbolic dysfunctions: Secondary | ICD-10-CM | POA: Diagnosis not present

## 2012-11-06 DIAGNOSIS — R262 Difficulty in walking, not elsewhere classified: Secondary | ICD-10-CM | POA: Diagnosis not present

## 2012-11-07 DIAGNOSIS — R488 Other symbolic dysfunctions: Secondary | ICD-10-CM | POA: Diagnosis not present

## 2012-11-07 DIAGNOSIS — R279 Unspecified lack of coordination: Secondary | ICD-10-CM | POA: Diagnosis not present

## 2012-11-07 DIAGNOSIS — M6281 Muscle weakness (generalized): Secondary | ICD-10-CM | POA: Diagnosis not present

## 2012-11-07 DIAGNOSIS — R293 Abnormal posture: Secondary | ICD-10-CM | POA: Diagnosis not present

## 2012-11-07 DIAGNOSIS — R262 Difficulty in walking, not elsewhere classified: Secondary | ICD-10-CM | POA: Diagnosis not present

## 2012-11-08 DIAGNOSIS — D631 Anemia in chronic kidney disease: Secondary | ICD-10-CM | POA: Diagnosis not present

## 2012-11-08 DIAGNOSIS — R488 Other symbolic dysfunctions: Secondary | ICD-10-CM | POA: Diagnosis not present

## 2012-11-08 DIAGNOSIS — D509 Iron deficiency anemia, unspecified: Secondary | ICD-10-CM | POA: Diagnosis not present

## 2012-11-08 DIAGNOSIS — N2581 Secondary hyperparathyroidism of renal origin: Secondary | ICD-10-CM | POA: Diagnosis not present

## 2012-11-08 DIAGNOSIS — R262 Difficulty in walking, not elsewhere classified: Secondary | ICD-10-CM | POA: Diagnosis not present

## 2012-11-08 DIAGNOSIS — R293 Abnormal posture: Secondary | ICD-10-CM | POA: Diagnosis not present

## 2012-11-08 DIAGNOSIS — R279 Unspecified lack of coordination: Secondary | ICD-10-CM | POA: Diagnosis not present

## 2012-11-08 DIAGNOSIS — J690 Pneumonitis due to inhalation of food and vomit: Secondary | ICD-10-CM | POA: Diagnosis not present

## 2012-11-08 DIAGNOSIS — M6281 Muscle weakness (generalized): Secondary | ICD-10-CM | POA: Diagnosis not present

## 2012-11-08 DIAGNOSIS — N186 End stage renal disease: Secondary | ICD-10-CM | POA: Diagnosis not present

## 2012-11-08 DIAGNOSIS — J698 Pneumonitis due to inhalation of other solids and liquids: Secondary | ICD-10-CM | POA: Diagnosis not present

## 2012-11-09 DIAGNOSIS — R279 Unspecified lack of coordination: Secondary | ICD-10-CM | POA: Diagnosis not present

## 2012-11-09 DIAGNOSIS — R488 Other symbolic dysfunctions: Secondary | ICD-10-CM | POA: Diagnosis not present

## 2012-11-09 DIAGNOSIS — M6281 Muscle weakness (generalized): Secondary | ICD-10-CM | POA: Diagnosis not present

## 2012-11-09 DIAGNOSIS — R262 Difficulty in walking, not elsewhere classified: Secondary | ICD-10-CM | POA: Diagnosis not present

## 2012-11-09 DIAGNOSIS — R293 Abnormal posture: Secondary | ICD-10-CM | POA: Diagnosis not present

## 2012-11-10 DIAGNOSIS — D509 Iron deficiency anemia, unspecified: Secondary | ICD-10-CM | POA: Diagnosis not present

## 2012-11-10 DIAGNOSIS — N2581 Secondary hyperparathyroidism of renal origin: Secondary | ICD-10-CM | POA: Diagnosis not present

## 2012-11-10 DIAGNOSIS — J690 Pneumonitis due to inhalation of food and vomit: Secondary | ICD-10-CM | POA: Diagnosis not present

## 2012-11-10 DIAGNOSIS — J698 Pneumonitis due to inhalation of other solids and liquids: Secondary | ICD-10-CM | POA: Diagnosis not present

## 2012-11-10 DIAGNOSIS — N186 End stage renal disease: Secondary | ICD-10-CM | POA: Diagnosis not present

## 2012-11-10 DIAGNOSIS — D631 Anemia in chronic kidney disease: Secondary | ICD-10-CM | POA: Diagnosis not present

## 2012-11-12 DIAGNOSIS — R293 Abnormal posture: Secondary | ICD-10-CM | POA: Diagnosis not present

## 2012-11-12 DIAGNOSIS — M6281 Muscle weakness (generalized): Secondary | ICD-10-CM | POA: Diagnosis not present

## 2012-11-12 DIAGNOSIS — R262 Difficulty in walking, not elsewhere classified: Secondary | ICD-10-CM | POA: Diagnosis not present

## 2012-11-12 DIAGNOSIS — R488 Other symbolic dysfunctions: Secondary | ICD-10-CM | POA: Diagnosis not present

## 2012-11-12 DIAGNOSIS — R279 Unspecified lack of coordination: Secondary | ICD-10-CM | POA: Diagnosis not present

## 2012-11-13 DIAGNOSIS — J698 Pneumonitis due to inhalation of other solids and liquids: Secondary | ICD-10-CM | POA: Diagnosis not present

## 2012-11-13 DIAGNOSIS — J690 Pneumonitis due to inhalation of food and vomit: Secondary | ICD-10-CM | POA: Diagnosis not present

## 2012-11-13 DIAGNOSIS — M6281 Muscle weakness (generalized): Secondary | ICD-10-CM | POA: Diagnosis not present

## 2012-11-13 DIAGNOSIS — R488 Other symbolic dysfunctions: Secondary | ICD-10-CM | POA: Diagnosis not present

## 2012-11-13 DIAGNOSIS — N2581 Secondary hyperparathyroidism of renal origin: Secondary | ICD-10-CM | POA: Diagnosis not present

## 2012-11-13 DIAGNOSIS — R279 Unspecified lack of coordination: Secondary | ICD-10-CM | POA: Diagnosis not present

## 2012-11-13 DIAGNOSIS — R293 Abnormal posture: Secondary | ICD-10-CM | POA: Diagnosis not present

## 2012-11-13 DIAGNOSIS — D631 Anemia in chronic kidney disease: Secondary | ICD-10-CM | POA: Diagnosis not present

## 2012-11-13 DIAGNOSIS — R262 Difficulty in walking, not elsewhere classified: Secondary | ICD-10-CM | POA: Diagnosis not present

## 2012-11-13 DIAGNOSIS — D509 Iron deficiency anemia, unspecified: Secondary | ICD-10-CM | POA: Diagnosis not present

## 2012-11-13 DIAGNOSIS — N186 End stage renal disease: Secondary | ICD-10-CM | POA: Diagnosis not present

## 2012-11-14 DIAGNOSIS — R262 Difficulty in walking, not elsewhere classified: Secondary | ICD-10-CM | POA: Diagnosis not present

## 2012-11-14 DIAGNOSIS — M6281 Muscle weakness (generalized): Secondary | ICD-10-CM | POA: Diagnosis not present

## 2012-11-14 DIAGNOSIS — R488 Other symbolic dysfunctions: Secondary | ICD-10-CM | POA: Diagnosis not present

## 2012-11-14 DIAGNOSIS — R293 Abnormal posture: Secondary | ICD-10-CM | POA: Diagnosis not present

## 2012-11-14 DIAGNOSIS — R279 Unspecified lack of coordination: Secondary | ICD-10-CM | POA: Diagnosis not present

## 2012-11-15 DIAGNOSIS — J698 Pneumonitis due to inhalation of other solids and liquids: Secondary | ICD-10-CM | POA: Diagnosis not present

## 2012-11-15 DIAGNOSIS — D509 Iron deficiency anemia, unspecified: Secondary | ICD-10-CM | POA: Diagnosis not present

## 2012-11-15 DIAGNOSIS — N186 End stage renal disease: Secondary | ICD-10-CM | POA: Diagnosis not present

## 2012-11-15 DIAGNOSIS — J690 Pneumonitis due to inhalation of food and vomit: Secondary | ICD-10-CM | POA: Diagnosis not present

## 2012-11-15 DIAGNOSIS — D631 Anemia in chronic kidney disease: Secondary | ICD-10-CM | POA: Diagnosis not present

## 2012-11-15 DIAGNOSIS — N2581 Secondary hyperparathyroidism of renal origin: Secondary | ICD-10-CM | POA: Diagnosis not present

## 2012-11-17 DIAGNOSIS — J698 Pneumonitis due to inhalation of other solids and liquids: Secondary | ICD-10-CM | POA: Diagnosis not present

## 2012-11-17 DIAGNOSIS — N2581 Secondary hyperparathyroidism of renal origin: Secondary | ICD-10-CM | POA: Diagnosis not present

## 2012-11-17 DIAGNOSIS — D631 Anemia in chronic kidney disease: Secondary | ICD-10-CM | POA: Diagnosis not present

## 2012-11-17 DIAGNOSIS — D509 Iron deficiency anemia, unspecified: Secondary | ICD-10-CM | POA: Diagnosis not present

## 2012-11-17 DIAGNOSIS — J690 Pneumonitis due to inhalation of food and vomit: Secondary | ICD-10-CM | POA: Diagnosis not present

## 2012-11-17 DIAGNOSIS — N186 End stage renal disease: Secondary | ICD-10-CM | POA: Diagnosis not present

## 2012-11-17 NOTE — Progress Notes (Signed)
REVIEWED.  

## 2012-11-19 DIAGNOSIS — S88119A Complete traumatic amputation at level between knee and ankle, unspecified lower leg, initial encounter: Secondary | ICD-10-CM | POA: Diagnosis not present

## 2012-11-19 DIAGNOSIS — N185 Chronic kidney disease, stage 5: Secondary | ICD-10-CM | POA: Diagnosis not present

## 2012-11-19 DIAGNOSIS — Z48812 Encounter for surgical aftercare following surgery on the circulatory system: Secondary | ICD-10-CM | POA: Diagnosis not present

## 2012-11-19 DIAGNOSIS — I798 Other disorders of arteries, arterioles and capillaries in diseases classified elsewhere: Secondary | ICD-10-CM | POA: Diagnosis not present

## 2012-11-19 DIAGNOSIS — J449 Chronic obstructive pulmonary disease, unspecified: Secondary | ICD-10-CM | POA: Diagnosis not present

## 2012-11-19 DIAGNOSIS — Z992 Dependence on renal dialysis: Secondary | ICD-10-CM | POA: Diagnosis not present

## 2012-11-19 DIAGNOSIS — IMO0002 Reserved for concepts with insufficient information to code with codable children: Secondary | ICD-10-CM | POA: Diagnosis not present

## 2012-11-19 DIAGNOSIS — Z9181 History of falling: Secondary | ICD-10-CM | POA: Diagnosis not present

## 2012-11-19 DIAGNOSIS — E1159 Type 2 diabetes mellitus with other circulatory complications: Secondary | ICD-10-CM | POA: Diagnosis not present

## 2012-11-19 DIAGNOSIS — R269 Unspecified abnormalities of gait and mobility: Secondary | ICD-10-CM | POA: Diagnosis not present

## 2012-11-19 DIAGNOSIS — I129 Hypertensive chronic kidney disease with stage 1 through stage 4 chronic kidney disease, or unspecified chronic kidney disease: Secondary | ICD-10-CM | POA: Diagnosis not present

## 2012-11-20 ENCOUNTER — Encounter: Payer: Self-pay | Admitting: Vascular Surgery

## 2012-11-20 DIAGNOSIS — IMO0002 Reserved for concepts with insufficient information to code with codable children: Secondary | ICD-10-CM | POA: Diagnosis not present

## 2012-11-20 DIAGNOSIS — N039 Chronic nephritic syndrome with unspecified morphologic changes: Secondary | ICD-10-CM | POA: Diagnosis not present

## 2012-11-20 DIAGNOSIS — Z48812 Encounter for surgical aftercare following surgery on the circulatory system: Secondary | ICD-10-CM | POA: Diagnosis not present

## 2012-11-20 DIAGNOSIS — J698 Pneumonitis due to inhalation of other solids and liquids: Secondary | ICD-10-CM | POA: Diagnosis not present

## 2012-11-20 DIAGNOSIS — I798 Other disorders of arteries, arterioles and capillaries in diseases classified elsewhere: Secondary | ICD-10-CM | POA: Diagnosis not present

## 2012-11-20 DIAGNOSIS — E1159 Type 2 diabetes mellitus with other circulatory complications: Secondary | ICD-10-CM | POA: Diagnosis not present

## 2012-11-20 DIAGNOSIS — N186 End stage renal disease: Secondary | ICD-10-CM | POA: Diagnosis not present

## 2012-11-20 DIAGNOSIS — J690 Pneumonitis due to inhalation of food and vomit: Secondary | ICD-10-CM | POA: Diagnosis not present

## 2012-11-20 DIAGNOSIS — R269 Unspecified abnormalities of gait and mobility: Secondary | ICD-10-CM | POA: Diagnosis not present

## 2012-11-20 DIAGNOSIS — D509 Iron deficiency anemia, unspecified: Secondary | ICD-10-CM | POA: Diagnosis not present

## 2012-11-20 DIAGNOSIS — I129 Hypertensive chronic kidney disease with stage 1 through stage 4 chronic kidney disease, or unspecified chronic kidney disease: Secondary | ICD-10-CM | POA: Diagnosis not present

## 2012-11-20 DIAGNOSIS — N2581 Secondary hyperparathyroidism of renal origin: Secondary | ICD-10-CM | POA: Diagnosis not present

## 2012-11-21 ENCOUNTER — Encounter: Payer: Self-pay | Admitting: Vascular Surgery

## 2012-11-21 ENCOUNTER — Other Ambulatory Visit: Payer: Self-pay

## 2012-11-21 ENCOUNTER — Encounter (INDEPENDENT_AMBULATORY_CARE_PROVIDER_SITE_OTHER): Payer: Medicare Other | Admitting: Vascular Surgery

## 2012-11-21 ENCOUNTER — Ambulatory Visit (INDEPENDENT_AMBULATORY_CARE_PROVIDER_SITE_OTHER): Payer: Medicare Other | Admitting: Vascular Surgery

## 2012-11-21 VITALS — BP 168/69 | HR 84 | Ht 63.0 in

## 2012-11-21 DIAGNOSIS — IMO0001 Reserved for inherently not codable concepts without codable children: Secondary | ICD-10-CM | POA: Diagnosis not present

## 2012-11-21 DIAGNOSIS — T847XXA Infection and inflammatory reaction due to other internal orthopedic prosthetic devices, implants and grafts, initial encounter: Secondary | ICD-10-CM | POA: Diagnosis not present

## 2012-11-21 DIAGNOSIS — N186 End stage renal disease: Secondary | ICD-10-CM

## 2012-11-21 DIAGNOSIS — Z01810 Encounter for preprocedural cardiovascular examination: Secondary | ICD-10-CM | POA: Diagnosis not present

## 2012-11-21 DIAGNOSIS — S72309A Unspecified fracture of shaft of unspecified femur, initial encounter for closed fracture: Secondary | ICD-10-CM | POA: Diagnosis not present

## 2012-11-21 NOTE — Progress Notes (Signed)
[facility]  Patient name: [name] MRN: [id number] DOB: 10/23/1945 Sex: female  REASON FOR VISIT: evaluate for new hemodialysis access.  HPI: [name] is a 67 y.o. female who dialyzes in a [facility] on Tuesdays Thursdays and Saturdays. She has had multiple attempts at access in her left arm and also in her right arm. Then the records I have in the office, she had a left brachial cephalic fistula in 2011. This was revised later that year she subsequently had attempted a right radiocephalic fistula when this failed in December of 2011 and had placement of a right brachiocephalic fistula. This also was revised in May of 2012. She needs new access. She currently has a left IJ tunneled dialysis catheter.  He denies any recent uremic symptoms. Specifically she denies nausea, vomiting, fatigue, palpitations, or anorexia.  Past Medical History  Diagnosis Date  . ESRD on hemodialysis     MWF at [facility]. Started HD November 26, 2009. 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  . COPD (chronic obstructive pulmonary disease)   . Diabetes mellitus   . Hypertension   . Thyroid disease   . Hyperlipidemia   . Bronchitis   . Leg pain   . Refusal of blood transfusions as patient is Jehovah's Witness     patient is jehovah witness  . Peripheral vascular disease   . Hypothyroidism   . Arthritis     knee  . Stroke    Family History  Problem Relation Age of Onset  . Diabetes Mother    SOCIAL HISTORY: History  Substance Use Topics  . Smoking status: Former Smoker -- 0.50 packs/day for 30 years    Types: Cigarettes    Quit date: 04/29/2012  . Smokeless tobacco: Never Used  . Alcohol Use: No   Allergies  Allergen Reactions  . Contrast Media (Iodinated Diagnostic Agents) Itching  . Fish Allergy Other (See Comments)    Reaction unknown   Current Outpatient Prescriptions  Medication Sig Dispense Refill  . amLODipine (NORVASC) 5 MG tablet Take 1 tablet (5 mg total) by  mouth daily.      . calcium acetate (PHOSLO) 667 MG capsule Take 1 capsule (667 mg total) by mouth 2 (two) times daily with a meal.      . chlorpheniramine-HYDROcodone (TUSSIONEX) 10-8 MG/5ML LQCR Take 5 mLs by mouth every 12 (twelve) hours as needed (cough).      . diclofenac sodium (VOLTAREN) 1 % GEL Apply 4 g topically every 8 (eight) hours as needed. For pain      . feeding supplement (PRO-STAT SUGAR FREE 64) LIQD Take 30 mLs by mouth 2 (two) times daily with breakfast and lunch.  900 mL    . fluticasone (FLONASE) 50 MCG/ACT nasal spray Place 2 sprays into the nose Daily.      . HYDROcodone-acetaminophen (NORCO) 10-325 MG per tablet Take 1-2 tablets by mouth every 6 (six) hours as needed for pain.      . insulin aspart (NOVOLOG) 100 UNIT/ML injection Inject 0-15 Units into the skin every 4 (four) hours. CBG < 70: Drink juice; CBG 70 - 120: 0 units: CBG 121 - 150: 2 units; CBG 151 - 200: 3 units; CBG 201 - 250: 5 units; CBG 251 - 300: 8 units;CBG 301 - 350: 11 units; CBG 351 - 400: 15 units; CBG > 400 : 15 units and Call MD  1 vial    . levothyroxine (SYNTHROID, LEVOTHROID) 75 MCG tablet Take   75 mcg by mouth daily.        . methocarbamol (ROBAXIN) 500 MG tablet Take 500 mg by mouth every 6 (six) hours as needed. For muscle spasms      . metoCLOPramide (REGLAN) 5 MG tablet Take 1-2 tablets (5-10 mg total) by mouth every 8 (eight) hours as needed (if ondansetron (ZOFRAN) ineffective.).      . multivitamin (RENA-VIT) TABS tablet Take 1 tablet by mouth daily.      . niacin (NIASPAN) 500 MG CR tablet Take 500 mg by mouth daily.       . Nutritional Supplements (FEEDING SUPPLEMENT, NEPRO CARB STEADY,) LIQD Take 237 mLs by mouth 2 (two) times daily between meals.      . ondansetron (ZOFRAN) 4 MG tablet Take 1 tablet (4 mg total) by mouth every 6 (six) hours as needed for nausea.  20 tablet    . oxyCODONE (OXY IR/ROXICODONE) 5 MG immediate release tablet Take 5 mg by mouth every 4 (four) hours as needed for  pain.      . oxyCODONE (OXY IR/ROXICODONE) 5 MG immediate release tablet Take 1 tablet (5 mg total) by mouth every 4 (four) hours as needed.  30 tablet  0  . oxyCODONE-acetaminophen (PERCOCET/ROXICET) 5-325 MG per tablet Take 1 tablet by mouth every 4 (four) hours as needed for pain.      . Probiotic Product (PROBIOTIC DAILY PO) Take 1 capsule by mouth daily.      . sodium chloride (OCEAN) 0.65 % nasal spray Place 2 sprays into the nose every 4 (four) hours as needed for congestion.      . sodium chloride 0.9 % SOLN 100 mL with ferric gluconate 12.5 MG/ML SOLN 125 mg Inject 125 mg into the vein every Monday, Wednesday, and Friday with hemodialysis.      . zinc oxide 20 % ointment Apply 1 application topically every 8 (eight) hours as needed for dry skin.       No current facility-administered medications for this visit.   REVIEW OF SYSTEMS: [X ] denotes positive finding; [  ] denotes negative finding  CARDIOVASCULAR:  [ ] chest pain   [ ] chest pressure   [ ] palpitations   [ ] orthopnea   [ ] dyspnea on exertion   [ ] claudication   [ ] rest pain   [ ] DVT   [ ] phlebitis PULMONARY:   [ ] productive cough   [ ] asthma   [ ] wheezing NEUROLOGIC:   [ ] weakness  [ ] paresthesias  [ ] aphasia  [ ] amaurosis  [ ] dizziness HEMATOLOGIC:   [ ] bleeding problems   [ ] clotting disorders MUSCULOSKELETAL:  [ ] joint pain   [ ] joint swelling [ ] leg swelling GASTROINTESTINAL: [ ]  blood in stool  [ ]  hematemesis GENITOURINARY:  [ ]  dysuria  [ ]  hematuria PSYCHIATRIC:  [ ] history of major depression INTEGUMENTARY:  [ ] rashes  [ ] ulcers CONSTITUTIONAL:  [ ] fever   [ ] chills  PHYSICAL EXAM: Filed Vitals:   11/21/12 1007  BP: 168/69  Pulse: 84  Height: 5' 3" (1.6 m)  SpO2: 100%   There is no weight on file to calculate BMI. GENERAL: The patient is a well-nourished female, in no acute distress. The vital signs are documented above. CARDIOVASCULAR: There is a regular rate and rhythm.   PULMONARY: There is good air exchange bilaterally   without wheezing or rales. ABDOMEN: Soft and non-tender with normal pitched bowel sounds.  MUSCULOSKELETAL: There are no major deformities or cyanosis. NEUROLOGIC: No focal weakness or paresthesias are detected. SKIN: There are no ulcers or rashes noted. PSYCHIATRIC: The patient has a normal affect.  DATA:  I have independently interpreted her vein mapping which shows that there is no identifiable cephalic or basilic vein on the left. The right forearm and upper arm cephalic vein are thrombosed. The basilic vein appears reasonable in size on the right although it empties into the brachial system fairly early.  I reviewed her duplex of the left lower for many from February of this year and she has evidence of a left superficial femoral artery occlusion.  MEDICAL ISSUES:  End stage renal disease This patient has no further options for access in the left arm. The forearm and upper arm cephalic vein on the right are thrombosed. The basilic vein appears to be reasonable in size in the upper arm just above the antecubital level but I believe empties into the brachial system fairly early. I think her only chance for a fistula would be a basilic vein transposition on the right. If this were not adequate we will place an AV graft. I have explained the indications for placement of an AV fistula or AV graft. I've explained that if at all possible we will place an AV fistula.  I have reviewed the risks of placement of an AV fistula including but not limited to: failure of the fistula to mature, need for subsequent interventions, and thrombosis. In addition I have reviewed the potential complications of placement of an AV graft. These risks include, but are not limited to, graft thrombosis, graft infection, wound healing problems, bleeding, arm swelling, and steal syndrome. All the patient's questions were answered and they are agreeable to proceed with surgery.  Her surgery is scheduled for 2714.    Dariella Gillihan S [facility] Beeper: [phone]    

## 2012-11-21 NOTE — Assessment & Plan Note (Signed)
This patient has no further options for access in the left arm. The forearm and upper arm cephalic vein on the right are thrombosed. The basilic vein appears to be reasonable in size in the upper arm just above the antecubital level but I believe empties into the brachial system fairly early. I think her only chance for a fistula would be a basilic vein transposition on the right. If this were not adequate we will place an AV graft. I have explained the indications for placement of an AV fistula or AV graft. I've explained that if at all possible we will place an AV fistula.  I have reviewed the risks of placement of an AV fistula including but not limited to: failure of the fistula to mature, need for subsequent interventions, and thrombosis. In addition I have reviewed the potential complications of placement of an AV graft. These risks include, but are not limited to, graft thrombosis, graft infection, wound healing problems, bleeding, arm swelling, and steal syndrome. All the patient's questions were answered and they are agreeable to proceed with surgery. Her surgery is scheduled for 2714.

## 2012-11-22 ENCOUNTER — Encounter (HOSPITAL_COMMUNITY): Payer: Self-pay | Admitting: *Deleted

## 2012-11-22 DIAGNOSIS — J698 Pneumonitis due to inhalation of other solids and liquids: Secondary | ICD-10-CM | POA: Diagnosis not present

## 2012-11-22 DIAGNOSIS — N2581 Secondary hyperparathyroidism of renal origin: Secondary | ICD-10-CM | POA: Diagnosis not present

## 2012-11-22 DIAGNOSIS — N186 End stage renal disease: Secondary | ICD-10-CM | POA: Diagnosis not present

## 2012-11-22 DIAGNOSIS — J690 Pneumonitis due to inhalation of food and vomit: Secondary | ICD-10-CM | POA: Diagnosis not present

## 2012-11-22 DIAGNOSIS — D509 Iron deficiency anemia, unspecified: Secondary | ICD-10-CM | POA: Diagnosis not present

## 2012-11-22 DIAGNOSIS — D631 Anemia in chronic kidney disease: Secondary | ICD-10-CM | POA: Diagnosis not present

## 2012-11-22 MED ORDER — DEXTROSE 5 % IV SOLN
1.5000 g | INTRAVENOUS | Status: AC
  Administered 2012-11-23: 1.5 g via INTRAVENOUS
  Filled 2012-11-22: qty 1.5

## 2012-11-23 ENCOUNTER — Other Ambulatory Visit: Payer: Self-pay | Admitting: *Deleted

## 2012-11-23 ENCOUNTER — Telehealth: Payer: Self-pay | Admitting: Vascular Surgery

## 2012-11-23 ENCOUNTER — Ambulatory Visit (HOSPITAL_COMMUNITY): Payer: Medicare Other | Admitting: Anesthesiology

## 2012-11-23 ENCOUNTER — Ambulatory Visit (HOSPITAL_COMMUNITY)
Admission: RE | Admit: 2012-11-23 | Discharge: 2012-11-23 | Disposition: A | Discharge status: Home / Self Care | Payer: Medicare Other | Source: Ambulatory Visit | Attending: Vascular Surgery | Admitting: Vascular Surgery

## 2012-11-23 ENCOUNTER — Encounter (HOSPITAL_COMMUNITY)
Admission: RE | Disposition: A | Discharge status: Home / Self Care | Payer: Self-pay | Source: Ambulatory Visit | Attending: Vascular Surgery

## 2012-11-23 ENCOUNTER — Encounter (HOSPITAL_COMMUNITY): Payer: Self-pay | Admitting: Anesthesiology

## 2012-11-23 ENCOUNTER — Encounter (HOSPITAL_COMMUNITY): Payer: Self-pay | Admitting: *Deleted

## 2012-11-23 DIAGNOSIS — Z79899 Other long term (current) drug therapy: Secondary | ICD-10-CM | POA: Insufficient documentation

## 2012-11-23 DIAGNOSIS — E119 Type 2 diabetes mellitus without complications: Secondary | ICD-10-CM | POA: Diagnosis not present

## 2012-11-23 DIAGNOSIS — Z992 Dependence on renal dialysis: Secondary | ICD-10-CM | POA: Diagnosis not present

## 2012-11-23 DIAGNOSIS — Z794 Long term (current) use of insulin: Secondary | ICD-10-CM | POA: Insufficient documentation

## 2012-11-23 DIAGNOSIS — J449 Chronic obstructive pulmonary disease, unspecified: Secondary | ICD-10-CM | POA: Insufficient documentation

## 2012-11-23 DIAGNOSIS — J4489 Other specified chronic obstructive pulmonary disease: Secondary | ICD-10-CM | POA: Insufficient documentation

## 2012-11-23 DIAGNOSIS — M1710 Unilateral primary osteoarthritis, unspecified knee: Secondary | ICD-10-CM | POA: Insufficient documentation

## 2012-11-23 DIAGNOSIS — I739 Peripheral vascular disease, unspecified: Secondary | ICD-10-CM | POA: Diagnosis not present

## 2012-11-23 DIAGNOSIS — N186 End stage renal disease: Secondary | ICD-10-CM

## 2012-11-23 DIAGNOSIS — I120 Hypertensive chronic kidney disease with stage 5 chronic kidney disease or end stage renal disease: Secondary | ICD-10-CM | POA: Insufficient documentation

## 2012-11-23 DIAGNOSIS — E039 Hypothyroidism, unspecified: Secondary | ICD-10-CM | POA: Insufficient documentation

## 2012-11-23 DIAGNOSIS — E785 Hyperlipidemia, unspecified: Secondary | ICD-10-CM | POA: Insufficient documentation

## 2012-11-23 DIAGNOSIS — Z8673 Personal history of transient ischemic attack (TIA), and cerebral infarction without residual deficits: Secondary | ICD-10-CM | POA: Insufficient documentation

## 2012-11-23 DIAGNOSIS — Z87891 Personal history of nicotine dependence: Secondary | ICD-10-CM | POA: Diagnosis not present

## 2012-11-23 DIAGNOSIS — I771 Stricture of artery: Secondary | ICD-10-CM | POA: Diagnosis not present

## 2012-11-23 DIAGNOSIS — I10 Essential (primary) hypertension: Secondary | ICD-10-CM | POA: Diagnosis not present

## 2012-11-23 HISTORY — DX: Diarrhea, unspecified: R19.7

## 2012-11-23 HISTORY — PX: BASCILIC VEIN TRANSPOSITION: SHX5742

## 2012-11-23 LAB — POCT I-STAT 4, (NA,K, GLUC, HGB,HCT)
Glucose, Bld: 199 mg/dL — ABNORMAL HIGH (ref 70–99)
HCT: 43 % (ref 36.0–46.0)
Hemoglobin: 14.6 g/dL (ref 12.0–15.0)

## 2012-11-23 SURGERY — TRANSPOSITION, VEIN, BASILIC
Anesthesia: General | Site: Arm Upper | Laterality: Right | Wound class: Clean

## 2012-11-23 MED ORDER — PROTAMINE SULFATE 10 MG/ML IV SOLN
INTRAVENOUS | Status: DC | PRN
  Administered 2012-11-23: 25 mg via INTRAVENOUS
  Administered 2012-11-23: 5 mg via INTRAVENOUS

## 2012-11-23 MED ORDER — THROMBIN 20000 UNITS EX SOLR
CUTANEOUS | Status: AC
  Filled 2012-11-23: qty 20000

## 2012-11-23 MED ORDER — LIDOCAINE HCL (PF) 1 % IJ SOLN
INTRAMUSCULAR | Status: AC
  Filled 2012-11-23: qty 30

## 2012-11-23 MED ORDER — OXYCODONE HCL 5 MG/5ML PO SOLN: 5.0000 mg | Freq: Once | ORAL | Status: AC | PRN

## 2012-11-23 MED ORDER — FENTANYL CITRATE 0.05 MG/ML IJ SOLN
INTRAMUSCULAR | Status: DC | PRN
  Administered 2012-11-23: 100 ug via INTRAVENOUS
  Administered 2012-11-23: 50 ug via INTRAVENOUS

## 2012-11-23 MED ORDER — PHENYLEPHRINE HCL 10 MG/ML IJ SOLN
10.0000 mg | INTRAVENOUS | Status: DC | PRN
  Administered 2012-11-23: 50 ug/min via INTRAVENOUS

## 2012-11-23 MED ORDER — ONDANSETRON HCL 4 MG/2ML IJ SOLN
INTRAMUSCULAR | Status: DC | PRN
  Administered 2012-11-23: 4 mg via INTRAVENOUS

## 2012-11-23 MED ORDER — MUPIROCIN 2 % EX OINT
TOPICAL_OINTMENT | Freq: Two times a day (BID) | CUTANEOUS | Status: DC
  Filled 2012-11-23: qty 22

## 2012-11-23 MED ORDER — OXYCODONE HCL 5 MG PO TABS
5.0000 mg | ORAL_TABLET | Freq: Once | ORAL | Status: AC | PRN
  Administered 2012-11-23: 5 mg via ORAL
  Filled 2012-11-23: qty 1

## 2012-11-23 MED ORDER — SODIUM CHLORIDE 0.9 % IR SOLN
Status: DC | PRN
  Administered 2012-11-23: 11:00:00

## 2012-11-23 MED ORDER — SODIUM CHLORIDE 0.9 % IV SOLN
INTRAVENOUS | Status: DC
  Administered 2012-11-23 (×2): via INTRAVENOUS

## 2012-11-23 MED ORDER — MUPIROCIN 2 % EX OINT
TOPICAL_OINTMENT | CUTANEOUS | Status: AC
  Filled 2012-11-23: qty 22

## 2012-11-23 MED ORDER — PHENYLEPHRINE HCL 10 MG/ML IJ SOLN
INTRAMUSCULAR | Status: DC | PRN
  Administered 2012-11-23: 120 ug via INTRAVENOUS
  Administered 2012-11-23: 80 ug via INTRAVENOUS
  Administered 2012-11-23: 120 ug via INTRAVENOUS

## 2012-11-23 MED ORDER — HEPARIN SODIUM (PORCINE) 1000 UNIT/ML IJ SOLN
INTRAMUSCULAR | Status: DC | PRN
  Administered 2012-11-23: 6000 [IU] via INTRAVENOUS

## 2012-11-23 MED ORDER — DIPHENHYDRAMINE HCL 50 MG/ML IJ SOLN
INTRAMUSCULAR | Status: DC | PRN
  Administered 2012-11-23: 12.5 mg via INTRAVENOUS

## 2012-11-23 MED ORDER — 0.9 % SODIUM CHLORIDE (POUR BTL) OPTIME
TOPICAL | Status: DC | PRN
  Administered 2012-11-23: 1000 mL

## 2012-11-23 MED ORDER — ONDANSETRON HCL 4 MG/2ML IJ SOLN: 4.0000 mg | Freq: Four times a day (QID) | INTRAMUSCULAR | Status: DC | PRN

## 2012-11-23 MED ORDER — FENTANYL CITRATE 0.05 MG/ML IJ SOLN: 25.0000 ug | INTRAMUSCULAR | Status: DC | PRN

## 2012-11-23 MED ORDER — PROPOFOL 10 MG/ML IV BOLUS
INTRAVENOUS | Status: DC | PRN
  Administered 2012-11-23: 130 mg via INTRAVENOUS

## 2012-11-23 MED ORDER — OXYCODONE HCL 5 MG PO TABS: 5.0000 mg | ORAL_TABLET | Freq: Four times a day (QID) | ORAL | Status: DC | PRN

## 2012-11-23 SURGICAL SUPPLY — 38 items
CANISTER SUCTION 2500CC (MISCELLANEOUS) ×2 IMPLANT
CLIP TI MEDIUM 24 (CLIP) ×2 IMPLANT
CLIP TI WIDE RED SMALL 24 (CLIP) ×2 IMPLANT
CLOTH BEACON ORANGE TIMEOUT ST (SAFETY) ×2 IMPLANT
COVER PROBE W GEL 5X96 (DRAPES) ×2 IMPLANT
COVER SURGICAL LIGHT HANDLE (MISCELLANEOUS) ×2 IMPLANT
DECANTER SPIKE VIAL GLASS SM (MISCELLANEOUS) ×2 IMPLANT
DERMABOND ADVANCED (GAUZE/BANDAGES/DRESSINGS) ×1
DERMABOND ADVANCED .7 DNX12 (GAUZE/BANDAGES/DRESSINGS) ×1 IMPLANT
DRAIN PENROSE 1/2X12 LTX STRL (WOUND CARE) IMPLANT
ELECT REM PT RETURN 9FT ADLT (ELECTROSURGICAL) ×2
ELECTRODE REM PT RTRN 9FT ADLT (ELECTROSURGICAL) ×1 IMPLANT
GLOVE BIO SURGEON STRL SZ 6.5 (GLOVE) ×6 IMPLANT
GLOVE BIO SURGEON STRL SZ7.5 (GLOVE) ×4 IMPLANT
GLOVE BIOGEL PI IND STRL 6.5 (GLOVE) ×1 IMPLANT
GLOVE BIOGEL PI IND STRL 7.0 (GLOVE) ×1 IMPLANT
GLOVE BIOGEL PI IND STRL 8 (GLOVE) ×2 IMPLANT
GLOVE BIOGEL PI INDICATOR 6.5 (GLOVE) ×1
GLOVE BIOGEL PI INDICATOR 7.0 (GLOVE) ×1
GLOVE BIOGEL PI INDICATOR 8 (GLOVE) ×2
GOWN STRL NON-REIN LRG LVL3 (GOWN DISPOSABLE) ×8 IMPLANT
KIT BASIN OR (CUSTOM PROCEDURE TRAY) ×2 IMPLANT
KIT ROOM TURNOVER OR (KITS) ×2 IMPLANT
NS IRRIG 1000ML POUR BTL (IV SOLUTION) ×2 IMPLANT
PACK CV ACCESS (CUSTOM PROCEDURE TRAY) ×2 IMPLANT
PAD ARMBOARD 7.5X6 YLW CONV (MISCELLANEOUS) ×4 IMPLANT
SPONGE SURGIFOAM ABS GEL 100 (HEMOSTASIS) IMPLANT
SUT PROLENE 6 0 BV (SUTURE) ×2 IMPLANT
SUT SILK 2 0 SH (SUTURE) ×2 IMPLANT
SUT SILK 3 0 (SUTURE) ×1
SUT SILK 3-0 18XBRD TIE 12 (SUTURE) ×1 IMPLANT
SUT VIC AB 3-0 SH 27 (SUTURE) ×1
SUT VIC AB 3-0 SH 27X BRD (SUTURE) ×1 IMPLANT
SUT VICRYL 4-0 PS2 18IN ABS (SUTURE) ×2 IMPLANT
TOWEL OR 17X24 6PK STRL BLUE (TOWEL DISPOSABLE) ×2 IMPLANT
TOWEL OR 17X26 10 PK STRL BLUE (TOWEL DISPOSABLE) ×2 IMPLANT
UNDERPAD 30X30 INCONTINENT (UNDERPADS AND DIAPERS) ×2 IMPLANT
WATER STERILE IRR 1000ML POUR (IV SOLUTION) ×2 IMPLANT

## 2012-11-23 NOTE — Interval H&P Note (Signed)
History and Physical Interval Note:  11/23/2012 10:17 AM  [name]  has presented today for surgery, with the diagnosis of End Stage Renal Disease  The various methods of treatment have been discussed with the patient and family. After consideration of risks, benefits and other options for treatment, the patient has consented to  Procedure(s): RIGHT BASCILIC VEIN TRANSPOSITION VS INSERTION RIGHT ARM ARTERIOVENOUS GORTEX GRAFT (Right) as a surgical intervention .  The patient's history has been reviewed, patient examined, no change in status, stable for surgery.  I have reviewed the patient's chart and labs.  Questions were answered to the patient's satisfaction.     Contrina Orona S

## 2012-11-23 NOTE — Anesthesia Procedure Notes (Signed)
Procedure Name: LMA Insertion Date/Time: 11/23/2012 10:34 AM Performed by: Orvilla Fus A Pre-anesthesia Checklist: Patient identified, Timeout performed, Emergency Drugs available, Suction available and Patient being monitored Patient Re-evaluated:Patient Re-evaluated prior to inductionOxygen Delivery Method: Circle system utilized Preoxygenation: Pre-oxygenation with 100% oxygen Intubation Type: IV induction LMA: LMA with gastric port inserted LMA Size: 4.0 Number of attempts: 1 Placement Confirmation: breath sounds checked- equal and bilateral and positive ETCO2 Tube secured with: Tape Dental Injury: Teeth and Oropharynx as per pre-operative assessment

## 2012-11-23 NOTE — Progress Notes (Addendum)
1530   Pt complaining of right arm pain.......Marland Kitchenpain med given....will re-assess in 70  Min.  DA 1600  Pt states that pain "has eased a whole lot".....comfortable appearing, ready to go home...Marland KitchenMarland KitchenDA

## 2012-11-23 NOTE — Telephone Encounter (Signed)
No answer, no vm, unable to give appt info, sent letter - kf

## 2012-11-23 NOTE — Telephone Encounter (Signed)
Message copied by Margaretmary Eddy on Fri Nov 23, 2012  3:31 PM ------      Message from: Phillips Odor      Created: Fri Nov 23, 2012  2:49 PM      Regarding: FW: charge and f/u                   ----- Message -----         From: Chuck Hint, MD         Sent: 11/23/2012  12:49 PM           To: Reuel Derby, Melene Plan, RN, #      Subject: charge and f/u                                           PROCEDURE: right basilic vein transposition            SURGEON: Di Kindle. Edilia Bo, MD, FACS            ASSIST: Haskell Riling, PA      Following this in 6 weeks as duplex of her fistula. Thank you. CD ------

## 2012-11-23 NOTE — H&P (View-Only) (Signed)
[facility]in Specialist of   Patient name: [name] MRN: [id number] DOB: 1946-01-01 Sex: female  REASON FOR VISIT: evaluate for new hemodialysis access.  HPI: [name] is a 67 y.o. female who dialyzes in a [place] [facility] on Tuesdays Thursdays and Saturdays. She has had multiple attempts at access in her left arm and also in her right arm. Then the records I have in the office, she had a left brachial cephalic fistula in 2011. This was revised later that year she subsequently had attempted a right radiocephalic fistula when this failed in December of 2011 and had placement of a right brachiocephalic fistula. This also was revised in May of 2012. She needs new access. She currently has a left IJ tunneled dialysis catheter.  He denies any recent uremic symptoms. Specifically she denies nausea, vomiting, fatigue, palpitations, or anorexia.  Past Medical History  Diagnosis Date  . ESRD on hemodialysis     MWF at [facility]. Started HD November 26, 2009. 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  Marland Kitchen COPD (chronic obstructive pulmonary disease)   . Diabetes mellitus   . Hypertension   . Thyroid disease   . Hyperlipidemia   . Bronchitis   . Leg pain   . 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 Peripheral vascular disease   . Hypothyroidism   . Arthritis     knee  . Stroke    Family History  Problem Relation Age of Onset  . Diabetes Mother    SOCIAL HISTORY: History  Substance Use Topics  . Smoking status: Former Smoker -- 0.50 packs/day for 30 years    Types: Cigarettes    Quit date: 04/29/2012  . Smokeless tobacco: Never Used  . Alcohol Use: No   Allergies  Allergen Reactions  . Contrast Media (Iodinated Diagnostic Agents) Itching  . Fish Allergy Other (See Comments)    Reaction unknown   Current Outpatient Prescriptions  Medication Sig Dispense Refill  . amLODipine (NORVASC) 5 MG tablet Take 1 tablet (5 mg total) by  mouth daily.      . calcium acetate (PHOSLO) 667 MG capsule Take 1 capsule (667 mg total) by mouth 2 (two) times daily with a meal.      . chlorpheniramine-HYDROcodone (TUSSIONEX) 10-8 MG/5ML LQCR Take 5 mLs by mouth every 12 (twelve) hours as needed (cough).      . diclofenac sodium (VOLTAREN) 1 % GEL Apply 4 g topically every 8 (eight) hours as needed. For pain      . feeding supplement (PRO-STAT SUGAR FREE 64) LIQD Take 30 mLs by mouth 2 (two) times daily with breakfast and lunch.  900 mL    . fluticasone (FLONASE) 50 MCG/ACT nasal spray Place 2 sprays into the nose Daily.      Marland Kitchen HYDROcodone-acetaminophen (NORCO) 10-325 MG per tablet Take 1-2 tablets by mouth every 6 (six) hours as needed for pain.      Marland Kitchen insulin aspart (NOVOLOG) 100 UNIT/ML injection Inject 0-15 Units into the skin every 4 (four) hours. CBG < 70: Drink juice; CBG 70 - 120: 0 units: CBG 121 - 150: 2 units; CBG 151 - 200: 3 units; CBG 201 - 250: 5 units; CBG 251 - 300: 8 units;CBG 301 - 350: 11 units; CBG 351 - 400: 15 units; CBG > 400 : 15 units and Call MD  1 vial    . levothyroxine (SYNTHROID, LEVOTHROID) 75 MCG tablet Take  75 mcg by mouth daily.        . methocarbamol (ROBAXIN) 500 MG tablet Take 500 mg by mouth every 6 (six) hours as needed. For muscle spasms      . metoCLOPramide (REGLAN) 5 MG tablet Take 1-2 tablets (5-10 mg total) by mouth every 8 (eight) hours as needed (if ondansetron (ZOFRAN) ineffective.).      Marland Kitchen multivitamin (RENA-VIT) TABS tablet Take 1 tablet by mouth daily.      . niacin (NIASPAN) 500 MG CR tablet Take 500 mg by mouth daily.       . Nutritional Supplements (FEEDING SUPPLEMENT, NEPRO CARB STEADY,) LIQD Take 237 mLs by mouth 2 (two) times daily between meals.      . ondansetron (ZOFRAN) 4 MG tablet Take 1 tablet (4 mg total) by mouth every 6 (six) hours as needed for nausea.  20 tablet    . oxyCODONE (OXY IR/ROXICODONE) 5 MG immediate release tablet Take 5 mg by mouth every 4 (four) hours as needed for  pain.      Marland Kitchen oxyCODONE (OXY IR/ROXICODONE) 5 MG immediate release tablet Take 1 tablet (5 mg total) by mouth every 4 (four) hours as needed.  30 tablet  0  . oxyCODONE-acetaminophen (PERCOCET/ROXICET) 5-325 MG per tablet Take 1 tablet by mouth every 4 (four) hours as needed for pain.      . Probiotic Product (PROBIOTIC DAILY PO) Take 1 capsule by mouth daily.      . sodium chloride (OCEAN) 0.65 % nasal spray Place 2 sprays into the nose every 4 (four) hours as needed for congestion.      . sodium chloride 0.9 % SOLN 100 mL with ferric gluconate 12.5 MG/ML SOLN 125 mg Inject 125 mg into the vein every Monday, Wednesday, and Friday with hemodialysis.      Marland Kitchen zinc oxide 20 % ointment Apply 1 application topically every 8 (eight) hours as needed for dry skin.       No current facility-administered medications for this visit.   REVIEW OF SYSTEMS: Arly.Keller ] denotes positive finding; [  ] denotes negative finding  CARDIOVASCULAR:  [ ]  chest pain   [ ]  chest pressure   [ ]  palpitations   [ ]  orthopnea   [ ]  dyspnea on exertion   [ ]  claudication   [ ]  rest pain   [ ]  DVT   [ ]  phlebitis PULMONARY:   [ ]  productive cough   [ ]  asthma   [ ]  wheezing NEUROLOGIC:   [ ]  weakness  [ ]  paresthesias  [ ]  aphasia  [ ]  amaurosis  [ ]  dizziness HEMATOLOGIC:   [ ]  bleeding problems   [ ]  clotting disorders MUSCULOSKELETAL:  [ ]  joint pain   [ ]  joint swelling [ ]  leg swelling GASTROINTESTINAL: [ ]   blood in stool  [ ]   hematemesis GENITOURINARY:  [ ]   dysuria  [ ]   hematuria PSYCHIATRIC:  [ ]  history of major depression INTEGUMENTARY:  [ ]  rashes  [ ]  ulcers CONSTITUTIONAL:  [ ]  fever   [ ]  chills  PHYSICAL EXAM: Filed Vitals:   11/21/12 1007  BP: 168/69  Pulse: 84  Height: 5\' 3"  (1.6 m)  SpO2: 100%   There is no weight on file to calculate BMI. GENERAL: The patient is a well-nourished female, in no acute distress. The vital signs are documented above. CARDIOVASCULAR: There is a regular rate and rhythm.   PULMONARY: There is good air exchange bilaterally  without wheezing or rales. ABDOMEN: Soft and non-tender with normal pitched bowel sounds.  MUSCULOSKELETAL: There are no major deformities or cyanosis. NEUROLOGIC: No focal weakness or paresthesias are detected. SKIN: There are no ulcers or rashes noted. PSYCHIATRIC: The patient has a normal affect.  DATA:  I have independently interpreted her vein mapping which shows that there is no identifiable cephalic or basilic vein on the left. The right forearm and upper arm cephalic vein are thrombosed. The basilic vein appears reasonable in size on the right although it empties into the brachial system fairly early.  I reviewed her duplex of the left lower for many from February of this year and she has evidence of a left superficial femoral artery occlusion.  MEDICAL ISSUES:  End stage renal disease This patient has no further options for access in the left arm. The forearm and upper arm cephalic vein on the right are thrombosed. The basilic vein appears to be reasonable in size in the upper arm just above the antecubital level but I believe empties into the brachial system fairly early. I think her only chance for a fistula would be a basilic vein transposition on the right. If this were not adequate we will place an AV graft. I have explained the indications for placement of an AV fistula or AV graft. I've explained that if at all possible we will place an AV fistula.  I have reviewed the risks of placement of an AV fistula including but not limited to: failure of the fistula to mature, need for subsequent interventions, and thrombosis. In addition I have reviewed the potential complications of placement of an AV graft. These risks include, but are not limited to, graft thrombosis, graft infection, wound healing problems, bleeding, arm swelling, and steal syndrome. All the patient's questions were answered and they are agreeable to proceed with surgery.  Her surgery is scheduled for 2714.    DICKSON,CHRISTOPHER S [facility] [place] Beeper: [phone]

## 2012-11-23 NOTE — Anesthesia Preprocedure Evaluation (Signed)
Anesthesia Evaluation  Patient identified by MRN, date of birth, ID band Patient awake    Reviewed: Allergy & Precautions, H&P , NPO status , Patient's Chart, lab work & pertinent test results  History of Anesthesia Complications (+) Family history of anesthesia reaction  Airway Mallampati: II  Neck ROM: full    Dental   Pulmonary COPDformer smoker,          Cardiovascular hypertension, + Peripheral Vascular Disease     Neuro/Psych CVA    GI/Hepatic   Endo/Other  diabetes, Type 2Hypothyroidism   Renal/GU ESRF and DialysisRenal disease     Musculoskeletal  (+) Arthritis -,   Abdominal   Peds  Hematology  (+) JEHOVAH'S WITNESS  Anesthesia Other Findings   Reproductive/Obstetrics                           Anesthesia Physical Anesthesia Plan  ASA: III  Anesthesia Plan: General   Post-op Pain Management:    Induction: Intravenous  Airway Management Planned: LMA  Additional Equipment:   Intra-op Plan:   Post-operative Plan:   Informed Consent: I have reviewed the patients History and Physical, chart, labs and discussed the procedure including the risks, benefits and alternatives for the proposed anesthesia with the patient or authorized representative who has indicated his/her understanding and acceptance.     Plan Discussed with: CRNA, Anesthesiologist and Surgeon  Anesthesia Plan Comments:         Anesthesia Quick Evaluation

## 2012-11-23 NOTE — OR Nursing (Signed)
Patient has allergy to contrast dye but denies allergy to iodine used on the skin.

## 2012-11-23 NOTE — Op Note (Signed)
NAME: [name]   MRN: [id number] DOB: 01/10/1946    DATE OF OPERATION: 11/23/2012  PREOP DIAGNOSIS: chronic kidney disease  POSTOP DIAGNOSIS: same  PROCEDURE: right basilic vein transposition  SURGEON: Di Kindle. Edilia Bo, MD, FACS  ASSIST: Haskell Riling, PA  ANESTHESIA: Gen.   EBL: minimal  INDICATIONS: [name] is a 67 y.o. female his only remaining option for fistula is a right basilic vein transposition.  FINDINGS: this patient has a high bifurcation of the brachial artery. There was plaque in the radial branch which was used for the inflow which I endarterectomized. The vein was reasonable in size although short.  TECHNIQUE: The patient was brought to the operating room and received a general anesthetic. The right upper extremity was prepped and draped in usual sterile fashion. Using 2 incisions along the medial aspect of the right arm the basilic vein was harvested to just below the antecubital level up to the axilla. Branches were divided between clips and 3-0 silk ties. The vein emptied into the brachial system in the mid upper arm I dissected out the brachial vein up towards the axilla. Through the peripheral incision the radial artery was dissected free. This patient had a high bifurcation of the brachial artery. This was determined using the Doppler. There was plaque in the radial artery at the site of the anastomosis. A tunnel was created from the radial artery up to the axilla and then the patient was heparinized. The graft was then brought through the tunnel after it had been marked to prevent twisting. The radial artery was clamped proximally and distally a longitudinal arteriotomy was made. The plaque was endarterectomized. The vein was then sewn end-to-side to the artery using continuous 6-0 Prolene suture. At the completion was a palpable thrill in the fistula. There was a radial and ulnar signal with the Doppler. Hemostasis was obtained in the wounds. The heparin was  partially reversed with protamine. Wounds were closed with deep layer 3-0 Vicryl, subcutaneous layer 3-0 Vicryl, and subcuticular 4-0 Vicryl per Dermabond was applied. The patient tolerated the procedure well was transferred to recovery in stable condition. All needle and sponge counts were correct.  Waverly Ferrari, MD, FACS [facility]  DATE OF DICTATION:   11/23/2012

## 2012-11-23 NOTE — Preoperative (Signed)
Beta Blockers   Reason not to administer Beta Blockers:Not Applicable 

## 2012-11-23 NOTE — Anesthesia Postprocedure Evaluation (Signed)
  Anesthesia Post-op Note  Patient: [name]  Procedure(s) Performed: Procedure(s): RIGHT BASCILIC VEIN TRANSPOSITION  (Right)  Patient Location: PACU  Anesthesia Type:General  Level of Consciousness: awake, alert  and oriented  Airway and Oxygen Therapy: Patient Spontanous Breathing and Patient connected to nasal cannula oxygen  Post-op Pain: mild  Post-op Assessment: Post-op Vital signs reviewed, Patient's Cardiovascular Status Stable, Respiratory Function Stable and Pain level controlled  Post-op Vital Signs: stable  Complications: No apparent anesthesia complications

## 2012-11-24 DIAGNOSIS — N2581 Secondary hyperparathyroidism of renal origin: Secondary | ICD-10-CM | POA: Diagnosis not present

## 2012-11-24 DIAGNOSIS — N039 Chronic nephritic syndrome with unspecified morphologic changes: Secondary | ICD-10-CM | POA: Diagnosis not present

## 2012-11-24 DIAGNOSIS — N186 End stage renal disease: Secondary | ICD-10-CM | POA: Diagnosis not present

## 2012-11-24 DIAGNOSIS — J690 Pneumonitis due to inhalation of food and vomit: Secondary | ICD-10-CM | POA: Diagnosis not present

## 2012-11-24 DIAGNOSIS — D631 Anemia in chronic kidney disease: Secondary | ICD-10-CM | POA: Diagnosis not present

## 2012-11-24 DIAGNOSIS — D509 Iron deficiency anemia, unspecified: Secondary | ICD-10-CM | POA: Diagnosis not present

## 2012-11-24 DIAGNOSIS — J698 Pneumonitis due to inhalation of other solids and liquids: Secondary | ICD-10-CM | POA: Diagnosis not present

## 2012-11-26 ENCOUNTER — Encounter (HOSPITAL_COMMUNITY): Payer: Self-pay | Admitting: Vascular Surgery

## 2012-11-26 DIAGNOSIS — N186 End stage renal disease: Secondary | ICD-10-CM | POA: Diagnosis not present

## 2012-11-26 DIAGNOSIS — R269 Unspecified abnormalities of gait and mobility: Secondary | ICD-10-CM | POA: Diagnosis not present

## 2012-11-26 DIAGNOSIS — I798 Other disorders of arteries, arterioles and capillaries in diseases classified elsewhere: Secondary | ICD-10-CM | POA: Diagnosis not present

## 2012-11-26 DIAGNOSIS — E1159 Type 2 diabetes mellitus with other circulatory complications: Secondary | ICD-10-CM | POA: Diagnosis not present

## 2012-11-26 DIAGNOSIS — I129 Hypertensive chronic kidney disease with stage 1 through stage 4 chronic kidney disease, or unspecified chronic kidney disease: Secondary | ICD-10-CM | POA: Diagnosis not present

## 2012-11-26 DIAGNOSIS — Z48812 Encounter for surgical aftercare following surgery on the circulatory system: Secondary | ICD-10-CM | POA: Diagnosis not present

## 2012-11-26 DIAGNOSIS — IMO0002 Reserved for concepts with insufficient information to code with codable children: Secondary | ICD-10-CM | POA: Diagnosis not present

## 2012-11-26 NOTE — Transfer of Care (Signed)
Immediate Anesthesia Transfer of Care Note  Patient: [name]  Procedure(s) Performed: Procedure(s): RIGHT BASCILIC VEIN TRANSPOSITION  (Right)  Patient Location: PACU  Anesthesia Type:General  Level of Consciousness: awake, alert  and oriented  Airway & Oxygen Therapy: Patient Spontanous Breathing and Patient connected to nasal cannula oxygen  Post-op Assessment: Report given to PACU RN, Post -op Vital signs reviewed and stable and Patient moving all extremities  Post vital signs: Reviewed and stable  Complications: No apparent anesthesia complications

## 2012-11-27 DIAGNOSIS — E876 Hypokalemia: Secondary | ICD-10-CM | POA: Diagnosis not present

## 2012-11-27 DIAGNOSIS — E039 Hypothyroidism, unspecified: Secondary | ICD-10-CM | POA: Diagnosis not present

## 2012-11-27 DIAGNOSIS — Z23 Encounter for immunization: Secondary | ICD-10-CM | POA: Diagnosis not present

## 2012-11-27 DIAGNOSIS — J698 Pneumonitis due to inhalation of other solids and liquids: Secondary | ICD-10-CM | POA: Diagnosis not present

## 2012-11-27 DIAGNOSIS — D509 Iron deficiency anemia, unspecified: Secondary | ICD-10-CM | POA: Diagnosis not present

## 2012-11-27 DIAGNOSIS — R609 Edema, unspecified: Secondary | ICD-10-CM | POA: Diagnosis not present

## 2012-11-27 DIAGNOSIS — N186 End stage renal disease: Secondary | ICD-10-CM | POA: Diagnosis not present

## 2012-11-27 DIAGNOSIS — J690 Pneumonitis due to inhalation of food and vomit: Secondary | ICD-10-CM | POA: Diagnosis not present

## 2012-11-27 DIAGNOSIS — D631 Anemia in chronic kidney disease: Secondary | ICD-10-CM | POA: Diagnosis not present

## 2012-11-27 DIAGNOSIS — N2581 Secondary hyperparathyroidism of renal origin: Secondary | ICD-10-CM | POA: Diagnosis not present

## 2012-11-28 DIAGNOSIS — I129 Hypertensive chronic kidney disease with stage 1 through stage 4 chronic kidney disease, or unspecified chronic kidney disease: Secondary | ICD-10-CM | POA: Diagnosis not present

## 2012-11-28 DIAGNOSIS — I798 Other disorders of arteries, arterioles and capillaries in diseases classified elsewhere: Secondary | ICD-10-CM | POA: Diagnosis not present

## 2012-11-28 DIAGNOSIS — R269 Unspecified abnormalities of gait and mobility: Secondary | ICD-10-CM | POA: Diagnosis not present

## 2012-11-28 DIAGNOSIS — E1159 Type 2 diabetes mellitus with other circulatory complications: Secondary | ICD-10-CM | POA: Diagnosis not present

## 2012-11-28 DIAGNOSIS — Z48812 Encounter for surgical aftercare following surgery on the circulatory system: Secondary | ICD-10-CM | POA: Diagnosis not present

## 2012-11-28 DIAGNOSIS — IMO0002 Reserved for concepts with insufficient information to code with codable children: Secondary | ICD-10-CM | POA: Diagnosis not present

## 2012-11-29 DIAGNOSIS — S88119A Complete traumatic amputation at level between knee and ankle, unspecified lower leg, initial encounter: Secondary | ICD-10-CM | POA: Diagnosis not present

## 2012-11-29 DIAGNOSIS — E1165 Type 2 diabetes mellitus with hyperglycemia: Secondary | ICD-10-CM | POA: Diagnosis not present

## 2012-11-29 DIAGNOSIS — E1129 Type 2 diabetes mellitus with other diabetic kidney complication: Secondary | ICD-10-CM | POA: Diagnosis not present

## 2012-12-03 DIAGNOSIS — E119 Type 2 diabetes mellitus without complications: Secondary | ICD-10-CM | POA: Diagnosis not present

## 2012-12-03 DIAGNOSIS — R269 Unspecified abnormalities of gait and mobility: Secondary | ICD-10-CM | POA: Diagnosis not present

## 2012-12-03 DIAGNOSIS — E1159 Type 2 diabetes mellitus with other circulatory complications: Secondary | ICD-10-CM | POA: Diagnosis not present

## 2012-12-03 DIAGNOSIS — I129 Hypertensive chronic kidney disease with stage 1 through stage 4 chronic kidney disease, or unspecified chronic kidney disease: Secondary | ICD-10-CM | POA: Diagnosis not present

## 2012-12-03 DIAGNOSIS — IMO0002 Reserved for concepts with insufficient information to code with codable children: Secondary | ICD-10-CM | POA: Diagnosis not present

## 2012-12-03 DIAGNOSIS — I10 Essential (primary) hypertension: Secondary | ICD-10-CM | POA: Diagnosis not present

## 2012-12-03 DIAGNOSIS — N19 Unspecified kidney failure: Secondary | ICD-10-CM | POA: Diagnosis not present

## 2012-12-03 DIAGNOSIS — Z48812 Encounter for surgical aftercare following surgery on the circulatory system: Secondary | ICD-10-CM | POA: Diagnosis not present

## 2012-12-03 DIAGNOSIS — I798 Other disorders of arteries, arterioles and capillaries in diseases classified elsewhere: Secondary | ICD-10-CM | POA: Diagnosis not present

## 2012-12-05 DIAGNOSIS — I129 Hypertensive chronic kidney disease with stage 1 through stage 4 chronic kidney disease, or unspecified chronic kidney disease: Secondary | ICD-10-CM | POA: Diagnosis not present

## 2012-12-05 DIAGNOSIS — R269 Unspecified abnormalities of gait and mobility: Secondary | ICD-10-CM | POA: Diagnosis not present

## 2012-12-05 DIAGNOSIS — I798 Other disorders of arteries, arterioles and capillaries in diseases classified elsewhere: Secondary | ICD-10-CM | POA: Diagnosis not present

## 2012-12-05 DIAGNOSIS — IMO0002 Reserved for concepts with insufficient information to code with codable children: Secondary | ICD-10-CM | POA: Diagnosis not present

## 2012-12-05 DIAGNOSIS — E1159 Type 2 diabetes mellitus with other circulatory complications: Secondary | ICD-10-CM | POA: Diagnosis not present

## 2012-12-05 DIAGNOSIS — Z48812 Encounter for surgical aftercare following surgery on the circulatory system: Secondary | ICD-10-CM | POA: Diagnosis not present

## 2012-12-07 DIAGNOSIS — Z48812 Encounter for surgical aftercare following surgery on the circulatory system: Secondary | ICD-10-CM | POA: Diagnosis not present

## 2012-12-07 DIAGNOSIS — E1159 Type 2 diabetes mellitus with other circulatory complications: Secondary | ICD-10-CM | POA: Diagnosis not present

## 2012-12-07 DIAGNOSIS — R269 Unspecified abnormalities of gait and mobility: Secondary | ICD-10-CM | POA: Diagnosis not present

## 2012-12-07 DIAGNOSIS — I798 Other disorders of arteries, arterioles and capillaries in diseases classified elsewhere: Secondary | ICD-10-CM | POA: Diagnosis not present

## 2012-12-07 DIAGNOSIS — IMO0002 Reserved for concepts with insufficient information to code with codable children: Secondary | ICD-10-CM | POA: Diagnosis not present

## 2012-12-07 DIAGNOSIS — I129 Hypertensive chronic kidney disease with stage 1 through stage 4 chronic kidney disease, or unspecified chronic kidney disease: Secondary | ICD-10-CM | POA: Diagnosis not present

## 2012-12-10 DIAGNOSIS — E1159 Type 2 diabetes mellitus with other circulatory complications: Secondary | ICD-10-CM | POA: Diagnosis not present

## 2012-12-10 DIAGNOSIS — I129 Hypertensive chronic kidney disease with stage 1 through stage 4 chronic kidney disease, or unspecified chronic kidney disease: Secondary | ICD-10-CM | POA: Diagnosis not present

## 2012-12-10 DIAGNOSIS — R269 Unspecified abnormalities of gait and mobility: Secondary | ICD-10-CM | POA: Diagnosis not present

## 2012-12-10 DIAGNOSIS — Z48812 Encounter for surgical aftercare following surgery on the circulatory system: Secondary | ICD-10-CM | POA: Diagnosis not present

## 2012-12-10 DIAGNOSIS — I798 Other disorders of arteries, arterioles and capillaries in diseases classified elsewhere: Secondary | ICD-10-CM | POA: Diagnosis not present

## 2012-12-10 DIAGNOSIS — IMO0002 Reserved for concepts with insufficient information to code with codable children: Secondary | ICD-10-CM | POA: Diagnosis not present

## 2012-12-12 DIAGNOSIS — IMO0002 Reserved for concepts with insufficient information to code with codable children: Secondary | ICD-10-CM | POA: Diagnosis not present

## 2012-12-12 DIAGNOSIS — I798 Other disorders of arteries, arterioles and capillaries in diseases classified elsewhere: Secondary | ICD-10-CM | POA: Diagnosis not present

## 2012-12-12 DIAGNOSIS — Z48812 Encounter for surgical aftercare following surgery on the circulatory system: Secondary | ICD-10-CM | POA: Diagnosis not present

## 2012-12-12 DIAGNOSIS — R269 Unspecified abnormalities of gait and mobility: Secondary | ICD-10-CM | POA: Diagnosis not present

## 2012-12-12 DIAGNOSIS — I129 Hypertensive chronic kidney disease with stage 1 through stage 4 chronic kidney disease, or unspecified chronic kidney disease: Secondary | ICD-10-CM | POA: Diagnosis not present

## 2012-12-12 DIAGNOSIS — E1159 Type 2 diabetes mellitus with other circulatory complications: Secondary | ICD-10-CM | POA: Diagnosis not present

## 2012-12-13 DIAGNOSIS — E039 Hypothyroidism, unspecified: Secondary | ICD-10-CM | POA: Diagnosis not present

## 2012-12-13 DIAGNOSIS — E1129 Type 2 diabetes mellitus with other diabetic kidney complication: Secondary | ICD-10-CM | POA: Diagnosis not present

## 2012-12-14 DIAGNOSIS — IMO0002 Reserved for concepts with insufficient information to code with codable children: Secondary | ICD-10-CM | POA: Diagnosis not present

## 2012-12-14 DIAGNOSIS — I798 Other disorders of arteries, arterioles and capillaries in diseases classified elsewhere: Secondary | ICD-10-CM | POA: Diagnosis not present

## 2012-12-14 DIAGNOSIS — E1159 Type 2 diabetes mellitus with other circulatory complications: Secondary | ICD-10-CM | POA: Diagnosis not present

## 2012-12-14 DIAGNOSIS — R269 Unspecified abnormalities of gait and mobility: Secondary | ICD-10-CM | POA: Diagnosis not present

## 2012-12-14 DIAGNOSIS — Z48812 Encounter for surgical aftercare following surgery on the circulatory system: Secondary | ICD-10-CM | POA: Diagnosis not present

## 2012-12-14 DIAGNOSIS — I129 Hypertensive chronic kidney disease with stage 1 through stage 4 chronic kidney disease, or unspecified chronic kidney disease: Secondary | ICD-10-CM | POA: Diagnosis not present

## 2012-12-17 DIAGNOSIS — E1159 Type 2 diabetes mellitus with other circulatory complications: Secondary | ICD-10-CM | POA: Diagnosis not present

## 2012-12-17 DIAGNOSIS — R269 Unspecified abnormalities of gait and mobility: Secondary | ICD-10-CM | POA: Diagnosis not present

## 2012-12-17 DIAGNOSIS — I798 Other disorders of arteries, arterioles and capillaries in diseases classified elsewhere: Secondary | ICD-10-CM | POA: Diagnosis not present

## 2012-12-17 DIAGNOSIS — IMO0002 Reserved for concepts with insufficient information to code with codable children: Secondary | ICD-10-CM | POA: Diagnosis not present

## 2012-12-17 DIAGNOSIS — Z48812 Encounter for surgical aftercare following surgery on the circulatory system: Secondary | ICD-10-CM | POA: Diagnosis not present

## 2012-12-17 DIAGNOSIS — I129 Hypertensive chronic kidney disease with stage 1 through stage 4 chronic kidney disease, or unspecified chronic kidney disease: Secondary | ICD-10-CM | POA: Diagnosis not present

## 2012-12-19 DIAGNOSIS — I798 Other disorders of arteries, arterioles and capillaries in diseases classified elsewhere: Secondary | ICD-10-CM | POA: Diagnosis not present

## 2012-12-19 DIAGNOSIS — S7290XA Unspecified fracture of unspecified femur, initial encounter for closed fracture: Secondary | ICD-10-CM | POA: Diagnosis not present

## 2012-12-19 DIAGNOSIS — I129 Hypertensive chronic kidney disease with stage 1 through stage 4 chronic kidney disease, or unspecified chronic kidney disease: Secondary | ICD-10-CM | POA: Diagnosis not present

## 2012-12-19 DIAGNOSIS — IMO0002 Reserved for concepts with insufficient information to code with codable children: Secondary | ICD-10-CM | POA: Diagnosis not present

## 2012-12-19 DIAGNOSIS — R269 Unspecified abnormalities of gait and mobility: Secondary | ICD-10-CM | POA: Diagnosis not present

## 2012-12-19 DIAGNOSIS — Z48812 Encounter for surgical aftercare following surgery on the circulatory system: Secondary | ICD-10-CM | POA: Diagnosis not present

## 2012-12-19 DIAGNOSIS — E1159 Type 2 diabetes mellitus with other circulatory complications: Secondary | ICD-10-CM | POA: Diagnosis not present

## 2012-12-24 DIAGNOSIS — IMO0002 Reserved for concepts with insufficient information to code with codable children: Secondary | ICD-10-CM | POA: Diagnosis not present

## 2012-12-24 DIAGNOSIS — I798 Other disorders of arteries, arterioles and capillaries in diseases classified elsewhere: Secondary | ICD-10-CM | POA: Diagnosis not present

## 2012-12-24 DIAGNOSIS — E1159 Type 2 diabetes mellitus with other circulatory complications: Secondary | ICD-10-CM | POA: Diagnosis not present

## 2012-12-24 DIAGNOSIS — R269 Unspecified abnormalities of gait and mobility: Secondary | ICD-10-CM | POA: Diagnosis not present

## 2012-12-24 DIAGNOSIS — I129 Hypertensive chronic kidney disease with stage 1 through stage 4 chronic kidney disease, or unspecified chronic kidney disease: Secondary | ICD-10-CM | POA: Diagnosis not present

## 2012-12-24 DIAGNOSIS — Z48812 Encounter for surgical aftercare following surgery on the circulatory system: Secondary | ICD-10-CM | POA: Diagnosis not present

## 2012-12-26 DIAGNOSIS — R269 Unspecified abnormalities of gait and mobility: Secondary | ICD-10-CM | POA: Diagnosis not present

## 2012-12-26 DIAGNOSIS — IMO0002 Reserved for concepts with insufficient information to code with codable children: Secondary | ICD-10-CM | POA: Diagnosis not present

## 2012-12-26 DIAGNOSIS — I129 Hypertensive chronic kidney disease with stage 1 through stage 4 chronic kidney disease, or unspecified chronic kidney disease: Secondary | ICD-10-CM | POA: Diagnosis not present

## 2012-12-26 DIAGNOSIS — I798 Other disorders of arteries, arterioles and capillaries in diseases classified elsewhere: Secondary | ICD-10-CM | POA: Diagnosis not present

## 2012-12-26 DIAGNOSIS — Z48812 Encounter for surgical aftercare following surgery on the circulatory system: Secondary | ICD-10-CM | POA: Diagnosis not present

## 2012-12-26 DIAGNOSIS — E1159 Type 2 diabetes mellitus with other circulatory complications: Secondary | ICD-10-CM | POA: Diagnosis not present

## 2012-12-27 DIAGNOSIS — N186 End stage renal disease: Secondary | ICD-10-CM | POA: Diagnosis not present

## 2012-12-29 DIAGNOSIS — N186 End stage renal disease: Secondary | ICD-10-CM | POA: Diagnosis not present

## 2012-12-29 DIAGNOSIS — E876 Hypokalemia: Secondary | ICD-10-CM | POA: Diagnosis not present

## 2012-12-29 DIAGNOSIS — D631 Anemia in chronic kidney disease: Secondary | ICD-10-CM | POA: Diagnosis not present

## 2012-12-29 DIAGNOSIS — N2581 Secondary hyperparathyroidism of renal origin: Secondary | ICD-10-CM | POA: Diagnosis not present

## 2012-12-29 DIAGNOSIS — E1129 Type 2 diabetes mellitus with other diabetic kidney complication: Secondary | ICD-10-CM | POA: Diagnosis not present

## 2012-12-29 DIAGNOSIS — J698 Pneumonitis due to inhalation of other solids and liquids: Secondary | ICD-10-CM | POA: Diagnosis not present

## 2012-12-31 DIAGNOSIS — IMO0002 Reserved for concepts with insufficient information to code with codable children: Secondary | ICD-10-CM | POA: Diagnosis not present

## 2012-12-31 DIAGNOSIS — I129 Hypertensive chronic kidney disease with stage 1 through stage 4 chronic kidney disease, or unspecified chronic kidney disease: Secondary | ICD-10-CM | POA: Diagnosis not present

## 2012-12-31 DIAGNOSIS — R269 Unspecified abnormalities of gait and mobility: Secondary | ICD-10-CM | POA: Diagnosis not present

## 2012-12-31 DIAGNOSIS — Z48812 Encounter for surgical aftercare following surgery on the circulatory system: Secondary | ICD-10-CM | POA: Diagnosis not present

## 2012-12-31 DIAGNOSIS — I798 Other disorders of arteries, arterioles and capillaries in diseases classified elsewhere: Secondary | ICD-10-CM | POA: Diagnosis not present

## 2012-12-31 DIAGNOSIS — E1159 Type 2 diabetes mellitus with other circulatory complications: Secondary | ICD-10-CM | POA: Diagnosis not present

## 2013-01-01 ENCOUNTER — Encounter: Payer: Self-pay | Admitting: Vascular Surgery

## 2013-01-02 ENCOUNTER — Encounter: Payer: Self-pay | Admitting: Vascular Surgery

## 2013-01-02 ENCOUNTER — Ambulatory Visit (INDEPENDENT_AMBULATORY_CARE_PROVIDER_SITE_OTHER): Payer: Medicare Other | Admitting: Vascular Surgery

## 2013-01-02 ENCOUNTER — Encounter (INDEPENDENT_AMBULATORY_CARE_PROVIDER_SITE_OTHER): Payer: Medicare Other | Admitting: *Deleted

## 2013-01-02 VITALS — BP 126/47 | HR 89 | Ht 63.0 in | Wt 163.0 lb

## 2013-01-02 DIAGNOSIS — I129 Hypertensive chronic kidney disease with stage 1 through stage 4 chronic kidney disease, or unspecified chronic kidney disease: Secondary | ICD-10-CM | POA: Diagnosis not present

## 2013-01-02 DIAGNOSIS — N186 End stage renal disease: Secondary | ICD-10-CM

## 2013-01-02 DIAGNOSIS — E1159 Type 2 diabetes mellitus with other circulatory complications: Secondary | ICD-10-CM | POA: Diagnosis not present

## 2013-01-02 DIAGNOSIS — I798 Other disorders of arteries, arterioles and capillaries in diseases classified elsewhere: Secondary | ICD-10-CM | POA: Diagnosis not present

## 2013-01-02 DIAGNOSIS — IMO0002 Reserved for concepts with insufficient information to code with codable children: Secondary | ICD-10-CM | POA: Diagnosis not present

## 2013-01-02 DIAGNOSIS — Z48812 Encounter for surgical aftercare following surgery on the circulatory system: Secondary | ICD-10-CM | POA: Diagnosis not present

## 2013-01-02 DIAGNOSIS — R269 Unspecified abnormalities of gait and mobility: Secondary | ICD-10-CM | POA: Diagnosis not present

## 2013-01-02 NOTE — Progress Notes (Signed)
VASCULAR AND VEIN SPECIALISTS POST OPERATIVE OFFICE NOTE  CC:  F/u for surgery  HPI:  This is a 67 y.o. female who is s/p right BVT by Dr. Edilia Bo on 11/23/12.  She states that she is having some swelling in her right arm and some numbness of her pinky finger, but this does not bother her.  Also c/o right shoulder/axilla pain.  She states that she keeps her arm propped up on pillows, but the swelling does not improve.  Pt is currently on HD and dialyzes through a left IJ diatek catheter that was placed by IR in December of 2013.    Allergies  Allergen Reactions  . Contrast Media (Iodinated Diagnostic Agents) Itching    Current Outpatient Prescriptions  Medication Sig Dispense Refill  . amLODipine (NORVASC) 5 MG tablet Take 1 tablet (5 mg total) by mouth daily.      . calcium acetate (PHOSLO) 667 MG capsule Take 1 capsule (667 mg total) by mouth 2 (two) times daily with a meal.      . insulin aspart (NOVOLOG) 100 UNIT/ML injection Inject 0-15 Units into the skin every 4 (four) hours. CBG < 70: Drink juice; CBG 70 - 120: 0 units: CBG 121 - 150: 2 units; CBG 151 - 200: 3 units; CBG 201 - 250: 5 units; CBG 251 - 300: 8 units;CBG 301 - 350: 11 units; CBG 351 - 400: 15 units; CBG > 400 : 15 units and Call MD  1 vial    . levothyroxine (SYNTHROID, LEVOTHROID) 75 MCG tablet Take 75 mcg by mouth daily.        . metoCLOPramide (REGLAN) 5 MG tablet Take 1-2 tablets (5-10 mg total) by mouth every 8 (eight) hours as needed (if ondansetron (ZOFRAN) ineffective.).      Marland Kitchen multivitamin (RENA-VIT) TABS tablet Take 1 tablet by mouth daily.      . niacin (NIASPAN) 500 MG CR tablet Take 500 mg by mouth daily.       . ondansetron (ZOFRAN) 4 MG tablet Take 1 tablet (4 mg total) by mouth every 6 (six) hours as needed for nausea.  20 tablet    . oxyCODONE (ROXICODONE) 5 MG immediate release tablet Take 1 tablet (5 mg total) by mouth every 6 (six) hours as needed for pain.  30 tablet  0   No current  facility-administered medications for this visit.    ROS:  See HPI  Physical Exam:  Filed Vitals:   01/02/13 1605  BP: 126/47  Pulse: 89    Incision:  Well healed Extremities:  + palpable right radial pulse.  There is a thrill and bruit within the right BVT and easily palpable.  Duplex AVF: -antegrade flow noted in the right radial artery -no internal vessel narrowing noted within the outflow vein or anastomosis -Cystic structure noted surrounding the proximal brachium level outflow ven measuring 5 x 2.8 x 2.7cm.  No extravascular flow noted in the cystic structure.  -patent right brachiobasilic AVF with no hemodynamically significant increase in velocities noted.  A/P:  This is a 67 y.o. female here for f/u to her right BVT on 11/23/12  -AVF is maturing nicely.  Should be ready to use in 6 weeks.  [name] currently dialyzing through left IJ catheter -right arm swelling-elevate arm on a couple of pillows-swelling should improve over time. -right shoulder/axilla pain-could be from positioning in the OR-Dr. Edilia Bo discussed with pt to rest the shoulder and this should improve. -will see back as needed.  Doreatha Massed, PA-C [facility] [phone]  Clinic MD:  Pt seen and examined with Dr. Edilia Bo  Agree with above. If her right upper extremity swelling worsens we could consider a fistulogram. However this appears to be gradually improving. I will see her back as needed.  Waverly Ferrari, MD, FACS Beeper [phone] 01/02/2013

## 2013-01-27 DIAGNOSIS — N186 End stage renal disease: Secondary | ICD-10-CM | POA: Diagnosis not present

## 2013-01-29 DIAGNOSIS — Z23 Encounter for immunization: Secondary | ICD-10-CM | POA: Diagnosis not present

## 2013-01-29 DIAGNOSIS — J698 Pneumonitis due to inhalation of other solids and liquids: Secondary | ICD-10-CM | POA: Diagnosis not present

## 2013-01-29 DIAGNOSIS — E1129 Type 2 diabetes mellitus with other diabetic kidney complication: Secondary | ICD-10-CM | POA: Diagnosis not present

## 2013-01-29 DIAGNOSIS — E876 Hypokalemia: Secondary | ICD-10-CM | POA: Diagnosis not present

## 2013-01-29 DIAGNOSIS — N2581 Secondary hyperparathyroidism of renal origin: Secondary | ICD-10-CM | POA: Diagnosis not present

## 2013-01-29 DIAGNOSIS — N186 End stage renal disease: Secondary | ICD-10-CM | POA: Diagnosis not present

## 2013-01-29 DIAGNOSIS — D631 Anemia in chronic kidney disease: Secondary | ICD-10-CM | POA: Diagnosis not present

## 2013-02-22 DIAGNOSIS — T82898A Other specified complication of vascular prosthetic devices, implants and grafts, initial encounter: Secondary | ICD-10-CM | POA: Diagnosis not present

## 2013-02-22 DIAGNOSIS — I871 Compression of vein: Secondary | ICD-10-CM | POA: Diagnosis not present

## 2013-02-22 DIAGNOSIS — N186 End stage renal disease: Secondary | ICD-10-CM | POA: Diagnosis not present

## 2013-02-26 DIAGNOSIS — N186 End stage renal disease: Secondary | ICD-10-CM | POA: Diagnosis not present

## 2013-02-28 DIAGNOSIS — E876 Hypokalemia: Secondary | ICD-10-CM | POA: Diagnosis not present

## 2013-02-28 DIAGNOSIS — N186 End stage renal disease: Secondary | ICD-10-CM | POA: Diagnosis not present

## 2013-02-28 DIAGNOSIS — E039 Hypothyroidism, unspecified: Secondary | ICD-10-CM | POA: Diagnosis not present

## 2013-02-28 DIAGNOSIS — D631 Anemia in chronic kidney disease: Secondary | ICD-10-CM | POA: Diagnosis not present

## 2013-02-28 DIAGNOSIS — N2581 Secondary hyperparathyroidism of renal origin: Secondary | ICD-10-CM | POA: Diagnosis not present

## 2013-03-04 DIAGNOSIS — F339 Major depressive disorder, recurrent, unspecified: Secondary | ICD-10-CM | POA: Diagnosis not present

## 2013-03-04 DIAGNOSIS — N19 Unspecified kidney failure: Secondary | ICD-10-CM | POA: Diagnosis not present

## 2013-03-04 DIAGNOSIS — I10 Essential (primary) hypertension: Secondary | ICD-10-CM | POA: Diagnosis not present

## 2013-03-04 DIAGNOSIS — E119 Type 2 diabetes mellitus without complications: Secondary | ICD-10-CM | POA: Diagnosis not present

## 2013-03-08 DIAGNOSIS — F329 Major depressive disorder, single episode, unspecified: Secondary | ICD-10-CM | POA: Diagnosis not present

## 2013-03-08 DIAGNOSIS — IMO0001 Reserved for inherently not codable concepts without codable children: Secondary | ICD-10-CM | POA: Diagnosis not present

## 2013-03-08 DIAGNOSIS — E119 Type 2 diabetes mellitus without complications: Secondary | ICD-10-CM | POA: Diagnosis not present

## 2013-03-08 DIAGNOSIS — M6281 Muscle weakness (generalized): Secondary | ICD-10-CM | POA: Diagnosis not present

## 2013-03-08 DIAGNOSIS — R269 Unspecified abnormalities of gait and mobility: Secondary | ICD-10-CM | POA: Diagnosis not present

## 2013-03-08 DIAGNOSIS — N19 Unspecified kidney failure: Secondary | ICD-10-CM | POA: Diagnosis not present

## 2013-03-08 DIAGNOSIS — D649 Anemia, unspecified: Secondary | ICD-10-CM | POA: Diagnosis not present

## 2013-03-08 DIAGNOSIS — S88119A Complete traumatic amputation at level between knee and ankle, unspecified lower leg, initial encounter: Secondary | ICD-10-CM | POA: Diagnosis not present

## 2013-03-08 DIAGNOSIS — I10 Essential (primary) hypertension: Secondary | ICD-10-CM | POA: Diagnosis not present

## 2013-03-13 DIAGNOSIS — D649 Anemia, unspecified: Secondary | ICD-10-CM | POA: Diagnosis not present

## 2013-03-13 DIAGNOSIS — IMO0001 Reserved for inherently not codable concepts without codable children: Secondary | ICD-10-CM | POA: Diagnosis not present

## 2013-03-13 DIAGNOSIS — F329 Major depressive disorder, single episode, unspecified: Secondary | ICD-10-CM | POA: Diagnosis not present

## 2013-03-13 DIAGNOSIS — M6281 Muscle weakness (generalized): Secondary | ICD-10-CM | POA: Diagnosis not present

## 2013-03-13 DIAGNOSIS — R269 Unspecified abnormalities of gait and mobility: Secondary | ICD-10-CM | POA: Diagnosis not present

## 2013-03-13 DIAGNOSIS — E119 Type 2 diabetes mellitus without complications: Secondary | ICD-10-CM | POA: Diagnosis not present

## 2013-03-14 DIAGNOSIS — E039 Hypothyroidism, unspecified: Secondary | ICD-10-CM | POA: Diagnosis not present

## 2013-03-14 DIAGNOSIS — E1129 Type 2 diabetes mellitus with other diabetic kidney complication: Secondary | ICD-10-CM | POA: Diagnosis not present

## 2013-03-15 DIAGNOSIS — E119 Type 2 diabetes mellitus without complications: Secondary | ICD-10-CM | POA: Diagnosis not present

## 2013-03-15 DIAGNOSIS — F329 Major depressive disorder, single episode, unspecified: Secondary | ICD-10-CM | POA: Diagnosis not present

## 2013-03-15 DIAGNOSIS — R269 Unspecified abnormalities of gait and mobility: Secondary | ICD-10-CM | POA: Diagnosis not present

## 2013-03-15 DIAGNOSIS — D649 Anemia, unspecified: Secondary | ICD-10-CM | POA: Diagnosis not present

## 2013-03-15 DIAGNOSIS — IMO0001 Reserved for inherently not codable concepts without codable children: Secondary | ICD-10-CM | POA: Diagnosis not present

## 2013-03-15 DIAGNOSIS — M6281 Muscle weakness (generalized): Secondary | ICD-10-CM | POA: Diagnosis not present

## 2013-03-19 DIAGNOSIS — S7290XA Unspecified fracture of unspecified femur, initial encounter for closed fracture: Secondary | ICD-10-CM | POA: Diagnosis not present

## 2013-03-22 DIAGNOSIS — R269 Unspecified abnormalities of gait and mobility: Secondary | ICD-10-CM | POA: Diagnosis not present

## 2013-03-22 DIAGNOSIS — D649 Anemia, unspecified: Secondary | ICD-10-CM | POA: Diagnosis not present

## 2013-03-22 DIAGNOSIS — IMO0001 Reserved for inherently not codable concepts without codable children: Secondary | ICD-10-CM | POA: Diagnosis not present

## 2013-03-22 DIAGNOSIS — E119 Type 2 diabetes mellitus without complications: Secondary | ICD-10-CM | POA: Diagnosis not present

## 2013-03-22 DIAGNOSIS — M6281 Muscle weakness (generalized): Secondary | ICD-10-CM | POA: Diagnosis not present

## 2013-03-22 DIAGNOSIS — F329 Major depressive disorder, single episode, unspecified: Secondary | ICD-10-CM | POA: Diagnosis not present

## 2013-03-27 DIAGNOSIS — M6281 Muscle weakness (generalized): Secondary | ICD-10-CM | POA: Diagnosis not present

## 2013-03-27 DIAGNOSIS — R269 Unspecified abnormalities of gait and mobility: Secondary | ICD-10-CM | POA: Diagnosis not present

## 2013-03-27 DIAGNOSIS — D649 Anemia, unspecified: Secondary | ICD-10-CM | POA: Diagnosis not present

## 2013-03-27 DIAGNOSIS — F329 Major depressive disorder, single episode, unspecified: Secondary | ICD-10-CM | POA: Diagnosis not present

## 2013-03-27 DIAGNOSIS — IMO0001 Reserved for inherently not codable concepts without codable children: Secondary | ICD-10-CM | POA: Diagnosis not present

## 2013-03-27 DIAGNOSIS — E119 Type 2 diabetes mellitus without complications: Secondary | ICD-10-CM | POA: Diagnosis not present

## 2013-03-29 DIAGNOSIS — D649 Anemia, unspecified: Secondary | ICD-10-CM | POA: Diagnosis not present

## 2013-03-29 DIAGNOSIS — M6281 Muscle weakness (generalized): Secondary | ICD-10-CM | POA: Diagnosis not present

## 2013-03-29 DIAGNOSIS — IMO0001 Reserved for inherently not codable concepts without codable children: Secondary | ICD-10-CM | POA: Diagnosis not present

## 2013-03-29 DIAGNOSIS — E119 Type 2 diabetes mellitus without complications: Secondary | ICD-10-CM | POA: Diagnosis not present

## 2013-03-29 DIAGNOSIS — N186 End stage renal disease: Secondary | ICD-10-CM | POA: Diagnosis not present

## 2013-03-29 DIAGNOSIS — F329 Major depressive disorder, single episode, unspecified: Secondary | ICD-10-CM | POA: Diagnosis not present

## 2013-03-29 DIAGNOSIS — R269 Unspecified abnormalities of gait and mobility: Secondary | ICD-10-CM | POA: Diagnosis not present

## 2013-03-30 DIAGNOSIS — E876 Hypokalemia: Secondary | ICD-10-CM | POA: Diagnosis not present

## 2013-03-30 DIAGNOSIS — N2581 Secondary hyperparathyroidism of renal origin: Secondary | ICD-10-CM | POA: Diagnosis not present

## 2013-03-30 DIAGNOSIS — D631 Anemia in chronic kidney disease: Secondary | ICD-10-CM | POA: Diagnosis not present

## 2013-03-30 DIAGNOSIS — N186 End stage renal disease: Secondary | ICD-10-CM | POA: Diagnosis not present

## 2013-04-03 DIAGNOSIS — F329 Major depressive disorder, single episode, unspecified: Secondary | ICD-10-CM | POA: Diagnosis not present

## 2013-04-03 DIAGNOSIS — E119 Type 2 diabetes mellitus without complications: Secondary | ICD-10-CM | POA: Diagnosis not present

## 2013-04-03 DIAGNOSIS — D649 Anemia, unspecified: Secondary | ICD-10-CM | POA: Diagnosis not present

## 2013-04-03 DIAGNOSIS — R269 Unspecified abnormalities of gait and mobility: Secondary | ICD-10-CM | POA: Diagnosis not present

## 2013-04-03 DIAGNOSIS — M6281 Muscle weakness (generalized): Secondary | ICD-10-CM | POA: Diagnosis not present

## 2013-04-03 DIAGNOSIS — IMO0001 Reserved for inherently not codable concepts without codable children: Secondary | ICD-10-CM | POA: Diagnosis not present

## 2013-04-05 DIAGNOSIS — M6281 Muscle weakness (generalized): Secondary | ICD-10-CM | POA: Diagnosis not present

## 2013-04-05 DIAGNOSIS — F329 Major depressive disorder, single episode, unspecified: Secondary | ICD-10-CM | POA: Diagnosis not present

## 2013-04-05 DIAGNOSIS — D649 Anemia, unspecified: Secondary | ICD-10-CM | POA: Diagnosis not present

## 2013-04-05 DIAGNOSIS — R269 Unspecified abnormalities of gait and mobility: Secondary | ICD-10-CM | POA: Diagnosis not present

## 2013-04-05 DIAGNOSIS — E119 Type 2 diabetes mellitus without complications: Secondary | ICD-10-CM | POA: Diagnosis not present

## 2013-04-05 DIAGNOSIS — IMO0001 Reserved for inherently not codable concepts without codable children: Secondary | ICD-10-CM | POA: Diagnosis not present

## 2013-04-10 DIAGNOSIS — D649 Anemia, unspecified: Secondary | ICD-10-CM | POA: Diagnosis not present

## 2013-04-10 DIAGNOSIS — IMO0001 Reserved for inherently not codable concepts without codable children: Secondary | ICD-10-CM | POA: Diagnosis not present

## 2013-04-10 DIAGNOSIS — F329 Major depressive disorder, single episode, unspecified: Secondary | ICD-10-CM | POA: Diagnosis not present

## 2013-04-10 DIAGNOSIS — R269 Unspecified abnormalities of gait and mobility: Secondary | ICD-10-CM | POA: Diagnosis not present

## 2013-04-10 DIAGNOSIS — E119 Type 2 diabetes mellitus without complications: Secondary | ICD-10-CM | POA: Diagnosis not present

## 2013-04-10 DIAGNOSIS — M6281 Muscle weakness (generalized): Secondary | ICD-10-CM | POA: Diagnosis not present

## 2013-04-28 DIAGNOSIS — N186 End stage renal disease: Secondary | ICD-10-CM | POA: Diagnosis not present

## 2013-04-30 DIAGNOSIS — D631 Anemia in chronic kidney disease: Secondary | ICD-10-CM | POA: Diagnosis not present

## 2013-04-30 DIAGNOSIS — N2581 Secondary hyperparathyroidism of renal origin: Secondary | ICD-10-CM | POA: Diagnosis not present

## 2013-04-30 DIAGNOSIS — N186 End stage renal disease: Secondary | ICD-10-CM | POA: Diagnosis not present

## 2013-05-03 DIAGNOSIS — I871 Compression of vein: Secondary | ICD-10-CM | POA: Diagnosis not present

## 2013-05-03 DIAGNOSIS — N186 End stage renal disease: Secondary | ICD-10-CM | POA: Diagnosis not present

## 2013-05-03 DIAGNOSIS — T82898A Other specified complication of vascular prosthetic devices, implants and grafts, initial encounter: Secondary | ICD-10-CM | POA: Diagnosis not present

## 2013-05-29 DIAGNOSIS — N186 End stage renal disease: Secondary | ICD-10-CM | POA: Diagnosis not present

## 2013-06-01 DIAGNOSIS — D631 Anemia in chronic kidney disease: Secondary | ICD-10-CM | POA: Diagnosis not present

## 2013-06-01 DIAGNOSIS — E039 Hypothyroidism, unspecified: Secondary | ICD-10-CM | POA: Diagnosis not present

## 2013-06-01 DIAGNOSIS — N186 End stage renal disease: Secondary | ICD-10-CM | POA: Diagnosis not present

## 2013-06-01 DIAGNOSIS — N2581 Secondary hyperparathyroidism of renal origin: Secondary | ICD-10-CM | POA: Diagnosis not present

## 2013-06-03 DIAGNOSIS — E119 Type 2 diabetes mellitus without complications: Secondary | ICD-10-CM | POA: Diagnosis not present

## 2013-06-03 DIAGNOSIS — N19 Unspecified kidney failure: Secondary | ICD-10-CM | POA: Diagnosis not present

## 2013-06-03 DIAGNOSIS — I10 Essential (primary) hypertension: Secondary | ICD-10-CM | POA: Diagnosis not present

## 2013-06-20 DIAGNOSIS — Z992 Dependence on renal dialysis: Secondary | ICD-10-CM | POA: Diagnosis not present

## 2013-06-20 DIAGNOSIS — E039 Hypothyroidism, unspecified: Secondary | ICD-10-CM | POA: Diagnosis not present

## 2013-06-20 DIAGNOSIS — E1129 Type 2 diabetes mellitus with other diabetic kidney complication: Secondary | ICD-10-CM | POA: Diagnosis not present

## 2013-06-29 DIAGNOSIS — N186 End stage renal disease: Secondary | ICD-10-CM | POA: Diagnosis not present

## 2013-07-02 DIAGNOSIS — D631 Anemia in chronic kidney disease: Secondary | ICD-10-CM | POA: Diagnosis not present

## 2013-07-02 DIAGNOSIS — N2581 Secondary hyperparathyroidism of renal origin: Secondary | ICD-10-CM | POA: Diagnosis not present

## 2013-07-02 DIAGNOSIS — N186 End stage renal disease: Secondary | ICD-10-CM | POA: Diagnosis not present

## 2013-07-04 DIAGNOSIS — N2581 Secondary hyperparathyroidism of renal origin: Secondary | ICD-10-CM | POA: Diagnosis not present

## 2013-07-04 DIAGNOSIS — D631 Anemia in chronic kidney disease: Secondary | ICD-10-CM | POA: Diagnosis not present

## 2013-07-04 DIAGNOSIS — N039 Chronic nephritic syndrome with unspecified morphologic changes: Secondary | ICD-10-CM | POA: Diagnosis not present

## 2013-07-04 DIAGNOSIS — N186 End stage renal disease: Secondary | ICD-10-CM | POA: Diagnosis not present

## 2013-07-06 DIAGNOSIS — N2581 Secondary hyperparathyroidism of renal origin: Secondary | ICD-10-CM | POA: Diagnosis not present

## 2013-07-06 DIAGNOSIS — D631 Anemia in chronic kidney disease: Secondary | ICD-10-CM | POA: Diagnosis not present

## 2013-07-06 DIAGNOSIS — N186 End stage renal disease: Secondary | ICD-10-CM | POA: Diagnosis not present

## 2013-07-08 DIAGNOSIS — N186 End stage renal disease: Secondary | ICD-10-CM | POA: Diagnosis not present

## 2013-07-08 DIAGNOSIS — I871 Compression of vein: Secondary | ICD-10-CM | POA: Diagnosis not present

## 2013-07-08 DIAGNOSIS — T82898A Other specified complication of vascular prosthetic devices, implants and grafts, initial encounter: Secondary | ICD-10-CM | POA: Diagnosis not present

## 2013-07-09 DIAGNOSIS — N186 End stage renal disease: Secondary | ICD-10-CM | POA: Diagnosis not present

## 2013-07-09 DIAGNOSIS — D631 Anemia in chronic kidney disease: Secondary | ICD-10-CM | POA: Diagnosis not present

## 2013-07-09 DIAGNOSIS — N2581 Secondary hyperparathyroidism of renal origin: Secondary | ICD-10-CM | POA: Diagnosis not present

## 2013-07-11 DIAGNOSIS — N186 End stage renal disease: Secondary | ICD-10-CM | POA: Diagnosis not present

## 2013-07-11 DIAGNOSIS — N2581 Secondary hyperparathyroidism of renal origin: Secondary | ICD-10-CM | POA: Diagnosis not present

## 2013-07-11 DIAGNOSIS — D631 Anemia in chronic kidney disease: Secondary | ICD-10-CM | POA: Diagnosis not present

## 2013-07-13 DIAGNOSIS — N186 End stage renal disease: Secondary | ICD-10-CM | POA: Diagnosis not present

## 2013-07-13 DIAGNOSIS — N2581 Secondary hyperparathyroidism of renal origin: Secondary | ICD-10-CM | POA: Diagnosis not present

## 2013-07-13 DIAGNOSIS — D631 Anemia in chronic kidney disease: Secondary | ICD-10-CM | POA: Diagnosis not present

## 2013-07-13 DIAGNOSIS — N039 Chronic nephritic syndrome with unspecified morphologic changes: Secondary | ICD-10-CM | POA: Diagnosis not present

## 2013-07-16 DIAGNOSIS — N2581 Secondary hyperparathyroidism of renal origin: Secondary | ICD-10-CM | POA: Diagnosis not present

## 2013-07-16 DIAGNOSIS — D631 Anemia in chronic kidney disease: Secondary | ICD-10-CM | POA: Diagnosis not present

## 2013-07-16 DIAGNOSIS — N186 End stage renal disease: Secondary | ICD-10-CM | POA: Diagnosis not present

## 2013-07-18 DIAGNOSIS — N186 End stage renal disease: Secondary | ICD-10-CM | POA: Diagnosis not present

## 2013-07-18 DIAGNOSIS — D631 Anemia in chronic kidney disease: Secondary | ICD-10-CM | POA: Diagnosis not present

## 2013-07-18 DIAGNOSIS — N2581 Secondary hyperparathyroidism of renal origin: Secondary | ICD-10-CM | POA: Diagnosis not present

## 2013-07-20 DIAGNOSIS — N2581 Secondary hyperparathyroidism of renal origin: Secondary | ICD-10-CM | POA: Diagnosis not present

## 2013-07-20 DIAGNOSIS — D631 Anemia in chronic kidney disease: Secondary | ICD-10-CM | POA: Diagnosis not present

## 2013-07-20 DIAGNOSIS — N186 End stage renal disease: Secondary | ICD-10-CM | POA: Diagnosis not present

## 2013-07-23 DIAGNOSIS — N186 End stage renal disease: Secondary | ICD-10-CM | POA: Diagnosis not present

## 2013-07-23 DIAGNOSIS — N2581 Secondary hyperparathyroidism of renal origin: Secondary | ICD-10-CM | POA: Diagnosis not present

## 2013-07-23 DIAGNOSIS — D631 Anemia in chronic kidney disease: Secondary | ICD-10-CM | POA: Diagnosis not present

## 2013-07-23 DIAGNOSIS — N039 Chronic nephritic syndrome with unspecified morphologic changes: Secondary | ICD-10-CM | POA: Diagnosis not present

## 2013-07-25 DIAGNOSIS — D631 Anemia in chronic kidney disease: Secondary | ICD-10-CM | POA: Diagnosis not present

## 2013-07-25 DIAGNOSIS — N186 End stage renal disease: Secondary | ICD-10-CM | POA: Diagnosis not present

## 2013-07-25 DIAGNOSIS — N039 Chronic nephritic syndrome with unspecified morphologic changes: Secondary | ICD-10-CM | POA: Diagnosis not present

## 2013-07-25 DIAGNOSIS — N2581 Secondary hyperparathyroidism of renal origin: Secondary | ICD-10-CM | POA: Diagnosis not present

## 2013-07-27 DIAGNOSIS — N2581 Secondary hyperparathyroidism of renal origin: Secondary | ICD-10-CM | POA: Diagnosis not present

## 2013-07-27 DIAGNOSIS — N186 End stage renal disease: Secondary | ICD-10-CM | POA: Diagnosis not present

## 2013-07-27 DIAGNOSIS — N039 Chronic nephritic syndrome with unspecified morphologic changes: Secondary | ICD-10-CM | POA: Diagnosis not present

## 2013-07-27 DIAGNOSIS — D631 Anemia in chronic kidney disease: Secondary | ICD-10-CM | POA: Diagnosis not present

## 2013-07-30 DIAGNOSIS — D509 Iron deficiency anemia, unspecified: Secondary | ICD-10-CM | POA: Diagnosis not present

## 2013-07-30 DIAGNOSIS — D631 Anemia in chronic kidney disease: Secondary | ICD-10-CM | POA: Diagnosis not present

## 2013-07-30 DIAGNOSIS — N2581 Secondary hyperparathyroidism of renal origin: Secondary | ICD-10-CM | POA: Diagnosis not present

## 2013-07-30 DIAGNOSIS — N186 End stage renal disease: Secondary | ICD-10-CM | POA: Diagnosis not present

## 2013-08-07 DIAGNOSIS — N186 End stage renal disease: Secondary | ICD-10-CM | POA: Diagnosis not present

## 2013-08-07 DIAGNOSIS — Z452 Encounter for adjustment and management of vascular access device: Secondary | ICD-10-CM | POA: Diagnosis not present

## 2013-08-27 DIAGNOSIS — N186 End stage renal disease: Secondary | ICD-10-CM | POA: Diagnosis not present

## 2013-08-29 DIAGNOSIS — D631 Anemia in chronic kidney disease: Secondary | ICD-10-CM | POA: Diagnosis not present

## 2013-08-29 DIAGNOSIS — N186 End stage renal disease: Secondary | ICD-10-CM | POA: Diagnosis not present

## 2013-08-29 DIAGNOSIS — E1129 Type 2 diabetes mellitus with other diabetic kidney complication: Secondary | ICD-10-CM | POA: Diagnosis not present

## 2013-08-29 DIAGNOSIS — N039 Chronic nephritic syndrome with unspecified morphologic changes: Secondary | ICD-10-CM | POA: Diagnosis not present

## 2013-08-29 DIAGNOSIS — N2581 Secondary hyperparathyroidism of renal origin: Secondary | ICD-10-CM | POA: Diagnosis not present

## 2013-09-09 DIAGNOSIS — N19 Unspecified kidney failure: Secondary | ICD-10-CM | POA: Diagnosis not present

## 2013-09-09 DIAGNOSIS — I10 Essential (primary) hypertension: Secondary | ICD-10-CM | POA: Diagnosis not present

## 2013-09-09 DIAGNOSIS — S88119A Complete traumatic amputation at level between knee and ankle, unspecified lower leg, initial encounter: Secondary | ICD-10-CM | POA: Diagnosis not present

## 2013-09-09 DIAGNOSIS — E119 Type 2 diabetes mellitus without complications: Secondary | ICD-10-CM | POA: Diagnosis not present

## 2013-09-19 DIAGNOSIS — E039 Hypothyroidism, unspecified: Secondary | ICD-10-CM | POA: Diagnosis not present

## 2013-09-19 DIAGNOSIS — E1129 Type 2 diabetes mellitus with other diabetic kidney complication: Secondary | ICD-10-CM | POA: Diagnosis not present

## 2013-09-26 DIAGNOSIS — N186 End stage renal disease: Secondary | ICD-10-CM | POA: Diagnosis not present

## 2013-09-28 DIAGNOSIS — N2581 Secondary hyperparathyroidism of renal origin: Secondary | ICD-10-CM | POA: Diagnosis not present

## 2013-09-28 DIAGNOSIS — D631 Anemia in chronic kidney disease: Secondary | ICD-10-CM | POA: Diagnosis not present

## 2013-09-28 DIAGNOSIS — N186 End stage renal disease: Secondary | ICD-10-CM | POA: Diagnosis not present

## 2013-09-28 DIAGNOSIS — E1129 Type 2 diabetes mellitus with other diabetic kidney complication: Secondary | ICD-10-CM | POA: Diagnosis not present

## 2013-10-09 DIAGNOSIS — H04129 Dry eye syndrome of unspecified lacrimal gland: Secondary | ICD-10-CM | POA: Diagnosis not present

## 2013-10-18 DIAGNOSIS — T82898A Other specified complication of vascular prosthetic devices, implants and grafts, initial encounter: Secondary | ICD-10-CM | POA: Diagnosis not present

## 2013-10-18 DIAGNOSIS — I871 Compression of vein: Secondary | ICD-10-CM | POA: Diagnosis not present

## 2013-10-18 DIAGNOSIS — N186 End stage renal disease: Secondary | ICD-10-CM | POA: Diagnosis not present

## 2013-10-23 DIAGNOSIS — E1139 Type 2 diabetes mellitus with other diabetic ophthalmic complication: Secondary | ICD-10-CM | POA: Diagnosis not present

## 2013-10-23 DIAGNOSIS — E11319 Type 2 diabetes mellitus with unspecified diabetic retinopathy without macular edema: Secondary | ICD-10-CM | POA: Diagnosis not present

## 2013-10-27 DIAGNOSIS — N186 End stage renal disease: Secondary | ICD-10-CM | POA: Diagnosis not present

## 2013-10-29 DIAGNOSIS — N039 Chronic nephritic syndrome with unspecified morphologic changes: Secondary | ICD-10-CM | POA: Diagnosis not present

## 2013-10-29 DIAGNOSIS — N186 End stage renal disease: Secondary | ICD-10-CM | POA: Diagnosis not present

## 2013-10-29 DIAGNOSIS — E1129 Type 2 diabetes mellitus with other diabetic kidney complication: Secondary | ICD-10-CM | POA: Diagnosis not present

## 2013-10-29 DIAGNOSIS — D631 Anemia in chronic kidney disease: Secondary | ICD-10-CM | POA: Diagnosis not present

## 2013-10-29 DIAGNOSIS — N2581 Secondary hyperparathyroidism of renal origin: Secondary | ICD-10-CM | POA: Diagnosis not present

## 2013-11-25 DIAGNOSIS — E11359 Type 2 diabetes mellitus with proliferative diabetic retinopathy without macular edema: Secondary | ICD-10-CM | POA: Diagnosis not present

## 2013-11-25 DIAGNOSIS — Z961 Presence of intraocular lens: Secondary | ICD-10-CM | POA: Diagnosis not present

## 2013-11-25 DIAGNOSIS — H26499 Other secondary cataract, unspecified eye: Secondary | ICD-10-CM | POA: Diagnosis not present

## 2013-11-25 DIAGNOSIS — E1139 Type 2 diabetes mellitus with other diabetic ophthalmic complication: Secondary | ICD-10-CM | POA: Diagnosis not present

## 2013-11-26 DIAGNOSIS — N186 End stage renal disease: Secondary | ICD-10-CM | POA: Diagnosis not present

## 2013-11-28 DIAGNOSIS — D631 Anemia in chronic kidney disease: Secondary | ICD-10-CM | POA: Diagnosis not present

## 2013-11-28 DIAGNOSIS — N039 Chronic nephritic syndrome with unspecified morphologic changes: Secondary | ICD-10-CM | POA: Diagnosis not present

## 2013-11-28 DIAGNOSIS — N2581 Secondary hyperparathyroidism of renal origin: Secondary | ICD-10-CM | POA: Diagnosis not present

## 2013-11-28 DIAGNOSIS — N186 End stage renal disease: Secondary | ICD-10-CM | POA: Diagnosis not present

## 2013-12-09 DIAGNOSIS — E119 Type 2 diabetes mellitus without complications: Secondary | ICD-10-CM | POA: Diagnosis not present

## 2013-12-09 DIAGNOSIS — E1149 Type 2 diabetes mellitus with other diabetic neurological complication: Secondary | ICD-10-CM | POA: Diagnosis not present

## 2013-12-09 DIAGNOSIS — I10 Essential (primary) hypertension: Secondary | ICD-10-CM | POA: Diagnosis not present

## 2013-12-19 DIAGNOSIS — E039 Hypothyroidism, unspecified: Secondary | ICD-10-CM | POA: Diagnosis not present

## 2013-12-19 DIAGNOSIS — E1129 Type 2 diabetes mellitus with other diabetic kidney complication: Secondary | ICD-10-CM | POA: Diagnosis not present

## 2013-12-27 DIAGNOSIS — N186 End stage renal disease: Secondary | ICD-10-CM | POA: Diagnosis not present

## 2013-12-28 DIAGNOSIS — D631 Anemia in chronic kidney disease: Secondary | ICD-10-CM | POA: Diagnosis not present

## 2013-12-28 DIAGNOSIS — N186 End stage renal disease: Secondary | ICD-10-CM | POA: Diagnosis not present

## 2013-12-28 DIAGNOSIS — N039 Chronic nephritic syndrome with unspecified morphologic changes: Secondary | ICD-10-CM | POA: Diagnosis not present

## 2013-12-28 DIAGNOSIS — E1129 Type 2 diabetes mellitus with other diabetic kidney complication: Secondary | ICD-10-CM | POA: Diagnosis not present

## 2013-12-28 DIAGNOSIS — N2581 Secondary hyperparathyroidism of renal origin: Secondary | ICD-10-CM | POA: Diagnosis not present

## 2013-12-31 DIAGNOSIS — N186 End stage renal disease: Secondary | ICD-10-CM | POA: Diagnosis not present

## 2013-12-31 DIAGNOSIS — E1129 Type 2 diabetes mellitus with other diabetic kidney complication: Secondary | ICD-10-CM | POA: Diagnosis not present

## 2013-12-31 DIAGNOSIS — D631 Anemia in chronic kidney disease: Secondary | ICD-10-CM | POA: Diagnosis not present

## 2013-12-31 DIAGNOSIS — N2581 Secondary hyperparathyroidism of renal origin: Secondary | ICD-10-CM | POA: Diagnosis not present

## 2014-01-02 DIAGNOSIS — E1129 Type 2 diabetes mellitus with other diabetic kidney complication: Secondary | ICD-10-CM | POA: Diagnosis not present

## 2014-01-02 DIAGNOSIS — N186 End stage renal disease: Secondary | ICD-10-CM | POA: Diagnosis not present

## 2014-01-02 DIAGNOSIS — N039 Chronic nephritic syndrome with unspecified morphologic changes: Secondary | ICD-10-CM | POA: Diagnosis not present

## 2014-01-02 DIAGNOSIS — D631 Anemia in chronic kidney disease: Secondary | ICD-10-CM | POA: Diagnosis not present

## 2014-01-02 DIAGNOSIS — N2581 Secondary hyperparathyroidism of renal origin: Secondary | ICD-10-CM | POA: Diagnosis not present

## 2014-01-04 DIAGNOSIS — N186 End stage renal disease: Secondary | ICD-10-CM | POA: Diagnosis not present

## 2014-01-04 DIAGNOSIS — E1129 Type 2 diabetes mellitus with other diabetic kidney complication: Secondary | ICD-10-CM | POA: Diagnosis not present

## 2014-01-04 DIAGNOSIS — N2581 Secondary hyperparathyroidism of renal origin: Secondary | ICD-10-CM | POA: Diagnosis not present

## 2014-01-04 DIAGNOSIS — D631 Anemia in chronic kidney disease: Secondary | ICD-10-CM | POA: Diagnosis not present

## 2014-01-07 DIAGNOSIS — N2581 Secondary hyperparathyroidism of renal origin: Secondary | ICD-10-CM | POA: Diagnosis not present

## 2014-01-07 DIAGNOSIS — E1129 Type 2 diabetes mellitus with other diabetic kidney complication: Secondary | ICD-10-CM | POA: Diagnosis not present

## 2014-01-07 DIAGNOSIS — N186 End stage renal disease: Secondary | ICD-10-CM | POA: Diagnosis not present

## 2014-01-07 DIAGNOSIS — N039 Chronic nephritic syndrome with unspecified morphologic changes: Secondary | ICD-10-CM | POA: Diagnosis not present

## 2014-01-07 DIAGNOSIS — D631 Anemia in chronic kidney disease: Secondary | ICD-10-CM | POA: Diagnosis not present

## 2014-01-09 DIAGNOSIS — E1129 Type 2 diabetes mellitus with other diabetic kidney complication: Secondary | ICD-10-CM | POA: Diagnosis not present

## 2014-01-09 DIAGNOSIS — N186 End stage renal disease: Secondary | ICD-10-CM | POA: Diagnosis not present

## 2014-01-09 DIAGNOSIS — D631 Anemia in chronic kidney disease: Secondary | ICD-10-CM | POA: Diagnosis not present

## 2014-01-09 DIAGNOSIS — N2581 Secondary hyperparathyroidism of renal origin: Secondary | ICD-10-CM | POA: Diagnosis not present

## 2014-01-11 DIAGNOSIS — N186 End stage renal disease: Secondary | ICD-10-CM | POA: Diagnosis not present

## 2014-01-11 DIAGNOSIS — D631 Anemia in chronic kidney disease: Secondary | ICD-10-CM | POA: Diagnosis not present

## 2014-01-11 DIAGNOSIS — N2581 Secondary hyperparathyroidism of renal origin: Secondary | ICD-10-CM | POA: Diagnosis not present

## 2014-01-11 DIAGNOSIS — E1129 Type 2 diabetes mellitus with other diabetic kidney complication: Secondary | ICD-10-CM | POA: Diagnosis not present

## 2014-01-14 DIAGNOSIS — N186 End stage renal disease: Secondary | ICD-10-CM | POA: Diagnosis not present

## 2014-01-14 DIAGNOSIS — D631 Anemia in chronic kidney disease: Secondary | ICD-10-CM | POA: Diagnosis not present

## 2014-01-14 DIAGNOSIS — N2581 Secondary hyperparathyroidism of renal origin: Secondary | ICD-10-CM | POA: Diagnosis not present

## 2014-01-14 DIAGNOSIS — E1129 Type 2 diabetes mellitus with other diabetic kidney complication: Secondary | ICD-10-CM | POA: Diagnosis not present

## 2014-01-16 DIAGNOSIS — N2581 Secondary hyperparathyroidism of renal origin: Secondary | ICD-10-CM | POA: Diagnosis not present

## 2014-01-16 DIAGNOSIS — D631 Anemia in chronic kidney disease: Secondary | ICD-10-CM | POA: Diagnosis not present

## 2014-01-16 DIAGNOSIS — N186 End stage renal disease: Secondary | ICD-10-CM | POA: Diagnosis not present

## 2014-01-16 DIAGNOSIS — E1129 Type 2 diabetes mellitus with other diabetic kidney complication: Secondary | ICD-10-CM | POA: Diagnosis not present

## 2014-01-18 DIAGNOSIS — D631 Anemia in chronic kidney disease: Secondary | ICD-10-CM | POA: Diagnosis not present

## 2014-01-18 DIAGNOSIS — E1129 Type 2 diabetes mellitus with other diabetic kidney complication: Secondary | ICD-10-CM | POA: Diagnosis not present

## 2014-01-18 DIAGNOSIS — N186 End stage renal disease: Secondary | ICD-10-CM | POA: Diagnosis not present

## 2014-01-18 DIAGNOSIS — N2581 Secondary hyperparathyroidism of renal origin: Secondary | ICD-10-CM | POA: Diagnosis not present

## 2014-01-21 DIAGNOSIS — N039 Chronic nephritic syndrome with unspecified morphologic changes: Secondary | ICD-10-CM | POA: Diagnosis not present

## 2014-01-21 DIAGNOSIS — N2581 Secondary hyperparathyroidism of renal origin: Secondary | ICD-10-CM | POA: Diagnosis not present

## 2014-01-21 DIAGNOSIS — N186 End stage renal disease: Secondary | ICD-10-CM | POA: Diagnosis not present

## 2014-01-21 DIAGNOSIS — D631 Anemia in chronic kidney disease: Secondary | ICD-10-CM | POA: Diagnosis not present

## 2014-01-21 DIAGNOSIS — E1129 Type 2 diabetes mellitus with other diabetic kidney complication: Secondary | ICD-10-CM | POA: Diagnosis not present

## 2014-01-23 DIAGNOSIS — D631 Anemia in chronic kidney disease: Secondary | ICD-10-CM | POA: Diagnosis not present

## 2014-01-23 DIAGNOSIS — N2581 Secondary hyperparathyroidism of renal origin: Secondary | ICD-10-CM | POA: Diagnosis not present

## 2014-01-23 DIAGNOSIS — N186 End stage renal disease: Secondary | ICD-10-CM | POA: Diagnosis not present

## 2014-01-23 DIAGNOSIS — E1129 Type 2 diabetes mellitus with other diabetic kidney complication: Secondary | ICD-10-CM | POA: Diagnosis not present

## 2014-01-25 DIAGNOSIS — N186 End stage renal disease: Secondary | ICD-10-CM | POA: Diagnosis not present

## 2014-01-25 DIAGNOSIS — E1129 Type 2 diabetes mellitus with other diabetic kidney complication: Secondary | ICD-10-CM | POA: Diagnosis not present

## 2014-01-25 DIAGNOSIS — N2581 Secondary hyperparathyroidism of renal origin: Secondary | ICD-10-CM | POA: Diagnosis not present

## 2014-01-25 DIAGNOSIS — D631 Anemia in chronic kidney disease: Secondary | ICD-10-CM | POA: Diagnosis not present

## 2014-01-27 DIAGNOSIS — N186 End stage renal disease: Secondary | ICD-10-CM | POA: Diagnosis not present

## 2014-01-28 DIAGNOSIS — N039 Chronic nephritic syndrome with unspecified morphologic changes: Secondary | ICD-10-CM | POA: Diagnosis not present

## 2014-01-28 DIAGNOSIS — N186 End stage renal disease: Secondary | ICD-10-CM | POA: Diagnosis not present

## 2014-01-28 DIAGNOSIS — D631 Anemia in chronic kidney disease: Secondary | ICD-10-CM | POA: Diagnosis not present

## 2014-01-28 DIAGNOSIS — Z23 Encounter for immunization: Secondary | ICD-10-CM | POA: Diagnosis not present

## 2014-01-28 DIAGNOSIS — E1129 Type 2 diabetes mellitus with other diabetic kidney complication: Secondary | ICD-10-CM | POA: Diagnosis not present

## 2014-02-26 DIAGNOSIS — N186 End stage renal disease: Secondary | ICD-10-CM | POA: Diagnosis not present

## 2014-02-27 DIAGNOSIS — N186 End stage renal disease: Secondary | ICD-10-CM | POA: Diagnosis not present

## 2014-02-27 DIAGNOSIS — D631 Anemia in chronic kidney disease: Secondary | ICD-10-CM | POA: Diagnosis not present

## 2014-02-27 DIAGNOSIS — E1129 Type 2 diabetes mellitus with other diabetic kidney complication: Secondary | ICD-10-CM | POA: Diagnosis not present

## 2014-03-01 DIAGNOSIS — N186 End stage renal disease: Secondary | ICD-10-CM | POA: Diagnosis not present

## 2014-03-01 DIAGNOSIS — D631 Anemia in chronic kidney disease: Secondary | ICD-10-CM | POA: Diagnosis not present

## 2014-03-01 DIAGNOSIS — E1129 Type 2 diabetes mellitus with other diabetic kidney complication: Secondary | ICD-10-CM | POA: Diagnosis not present

## 2014-03-04 DIAGNOSIS — D631 Anemia in chronic kidney disease: Secondary | ICD-10-CM | POA: Diagnosis not present

## 2014-03-04 DIAGNOSIS — N186 End stage renal disease: Secondary | ICD-10-CM | POA: Diagnosis not present

## 2014-03-04 DIAGNOSIS — E1129 Type 2 diabetes mellitus with other diabetic kidney complication: Secondary | ICD-10-CM | POA: Diagnosis not present

## 2014-03-06 DIAGNOSIS — D631 Anemia in chronic kidney disease: Secondary | ICD-10-CM | POA: Diagnosis not present

## 2014-03-06 DIAGNOSIS — E1129 Type 2 diabetes mellitus with other diabetic kidney complication: Secondary | ICD-10-CM | POA: Diagnosis not present

## 2014-03-06 DIAGNOSIS — N186 End stage renal disease: Secondary | ICD-10-CM | POA: Diagnosis not present

## 2014-03-08 DIAGNOSIS — D631 Anemia in chronic kidney disease: Secondary | ICD-10-CM | POA: Diagnosis not present

## 2014-03-08 DIAGNOSIS — N186 End stage renal disease: Secondary | ICD-10-CM | POA: Diagnosis not present

## 2014-03-08 DIAGNOSIS — E1129 Type 2 diabetes mellitus with other diabetic kidney complication: Secondary | ICD-10-CM | POA: Diagnosis not present

## 2014-03-11 DIAGNOSIS — E1129 Type 2 diabetes mellitus with other diabetic kidney complication: Secondary | ICD-10-CM | POA: Diagnosis not present

## 2014-03-11 DIAGNOSIS — D631 Anemia in chronic kidney disease: Secondary | ICD-10-CM | POA: Diagnosis not present

## 2014-03-11 DIAGNOSIS — N186 End stage renal disease: Secondary | ICD-10-CM | POA: Diagnosis not present

## 2014-03-13 DIAGNOSIS — N186 End stage renal disease: Secondary | ICD-10-CM | POA: Diagnosis not present

## 2014-03-13 DIAGNOSIS — E1129 Type 2 diabetes mellitus with other diabetic kidney complication: Secondary | ICD-10-CM | POA: Diagnosis not present

## 2014-03-13 DIAGNOSIS — D631 Anemia in chronic kidney disease: Secondary | ICD-10-CM | POA: Diagnosis not present

## 2014-03-15 DIAGNOSIS — D631 Anemia in chronic kidney disease: Secondary | ICD-10-CM | POA: Diagnosis not present

## 2014-03-15 DIAGNOSIS — N186 End stage renal disease: Secondary | ICD-10-CM | POA: Diagnosis not present

## 2014-03-15 DIAGNOSIS — E1129 Type 2 diabetes mellitus with other diabetic kidney complication: Secondary | ICD-10-CM | POA: Diagnosis not present

## 2014-03-18 DIAGNOSIS — D631 Anemia in chronic kidney disease: Secondary | ICD-10-CM | POA: Diagnosis not present

## 2014-03-18 DIAGNOSIS — E1129 Type 2 diabetes mellitus with other diabetic kidney complication: Secondary | ICD-10-CM | POA: Diagnosis not present

## 2014-03-18 DIAGNOSIS — N186 End stage renal disease: Secondary | ICD-10-CM | POA: Diagnosis not present

## 2014-03-20 DIAGNOSIS — E1129 Type 2 diabetes mellitus with other diabetic kidney complication: Secondary | ICD-10-CM | POA: Diagnosis not present

## 2014-03-20 DIAGNOSIS — E785 Hyperlipidemia, unspecified: Secondary | ICD-10-CM | POA: Diagnosis not present

## 2014-03-20 DIAGNOSIS — D631 Anemia in chronic kidney disease: Secondary | ICD-10-CM | POA: Diagnosis not present

## 2014-03-20 DIAGNOSIS — N186 End stage renal disease: Secondary | ICD-10-CM | POA: Diagnosis not present

## 2014-03-22 DIAGNOSIS — D631 Anemia in chronic kidney disease: Secondary | ICD-10-CM | POA: Diagnosis not present

## 2014-03-22 DIAGNOSIS — N186 End stage renal disease: Secondary | ICD-10-CM | POA: Diagnosis not present

## 2014-03-22 DIAGNOSIS — E1129 Type 2 diabetes mellitus with other diabetic kidney complication: Secondary | ICD-10-CM | POA: Diagnosis not present

## 2014-03-25 DIAGNOSIS — D631 Anemia in chronic kidney disease: Secondary | ICD-10-CM | POA: Diagnosis not present

## 2014-03-25 DIAGNOSIS — N186 End stage renal disease: Secondary | ICD-10-CM | POA: Diagnosis not present

## 2014-03-25 DIAGNOSIS — E1129 Type 2 diabetes mellitus with other diabetic kidney complication: Secondary | ICD-10-CM | POA: Diagnosis not present

## 2014-03-27 DIAGNOSIS — N186 End stage renal disease: Secondary | ICD-10-CM | POA: Diagnosis not present

## 2014-03-27 DIAGNOSIS — D631 Anemia in chronic kidney disease: Secondary | ICD-10-CM | POA: Diagnosis not present

## 2014-03-27 DIAGNOSIS — E1129 Type 2 diabetes mellitus with other diabetic kidney complication: Secondary | ICD-10-CM | POA: Diagnosis not present

## 2014-03-29 DIAGNOSIS — Z992 Dependence on renal dialysis: Secondary | ICD-10-CM | POA: Diagnosis not present

## 2014-03-29 DIAGNOSIS — N186 End stage renal disease: Secondary | ICD-10-CM | POA: Diagnosis not present

## 2014-03-29 DIAGNOSIS — E1129 Type 2 diabetes mellitus with other diabetic kidney complication: Secondary | ICD-10-CM | POA: Diagnosis not present

## 2014-03-29 DIAGNOSIS — D631 Anemia in chronic kidney disease: Secondary | ICD-10-CM | POA: Diagnosis not present

## 2014-04-01 DIAGNOSIS — D631 Anemia in chronic kidney disease: Secondary | ICD-10-CM | POA: Diagnosis not present

## 2014-04-01 DIAGNOSIS — E1129 Type 2 diabetes mellitus with other diabetic kidney complication: Secondary | ICD-10-CM | POA: Diagnosis not present

## 2014-04-01 DIAGNOSIS — N186 End stage renal disease: Secondary | ICD-10-CM | POA: Diagnosis not present

## 2014-04-07 DIAGNOSIS — M25512 Pain in left shoulder: Secondary | ICD-10-CM | POA: Diagnosis not present

## 2014-04-07 DIAGNOSIS — E119 Type 2 diabetes mellitus without complications: Secondary | ICD-10-CM | POA: Diagnosis not present

## 2014-04-07 DIAGNOSIS — M5489 Other dorsalgia: Secondary | ICD-10-CM | POA: Diagnosis not present

## 2014-04-07 DIAGNOSIS — I10 Essential (primary) hypertension: Secondary | ICD-10-CM | POA: Diagnosis not present

## 2014-04-10 ENCOUNTER — Other Ambulatory Visit (HOSPITAL_COMMUNITY): Payer: Self-pay | Admitting: Internal Medicine

## 2014-04-10 ENCOUNTER — Ambulatory Visit (HOSPITAL_COMMUNITY)
Admission: RE | Admit: 2014-04-10 | Discharge: 2014-04-10 | Disposition: A | Discharge status: Home / Self Care | Payer: Medicare Other | Source: Ambulatory Visit | Attending: Internal Medicine | Admitting: Internal Medicine

## 2014-04-10 DIAGNOSIS — M19012 Primary osteoarthritis, left shoulder: Secondary | ICD-10-CM | POA: Diagnosis not present

## 2014-04-10 DIAGNOSIS — M25512 Pain in left shoulder: Secondary | ICD-10-CM | POA: Diagnosis not present

## 2014-04-22 ENCOUNTER — Ambulatory Visit (INDEPENDENT_AMBULATORY_CARE_PROVIDER_SITE_OTHER): Payer: Medicare Other | Admitting: Orthopedic Surgery

## 2014-04-22 VITALS — BP 140/61 | Ht 63.0 in | Wt 182.0 lb

## 2014-04-22 DIAGNOSIS — M75102 Unspecified rotator cuff tear or rupture of left shoulder, not specified as traumatic: Secondary | ICD-10-CM | POA: Diagnosis not present

## 2014-04-22 MED ORDER — HYDROCODONE-ACETAMINOPHEN 5-325 MG PO TABS: 1.0000 | ORAL_TABLET | Freq: Four times a day (QID) | ORAL | Status: DC | PRN
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
## 2014-04-22 NOTE — Patient Instructions (Signed)
Take norco for pain Call Dr Legrand Rams for any refills

## 2014-04-28 DIAGNOSIS — Z992 Dependence on renal dialysis: Secondary | ICD-10-CM | POA: Diagnosis not present

## 2014-04-28 DIAGNOSIS — N186 End stage renal disease: Secondary | ICD-10-CM | POA: Diagnosis not present

## 2014-04-29 DIAGNOSIS — N186 End stage renal disease: Secondary | ICD-10-CM | POA: Diagnosis not present

## 2014-04-29 DIAGNOSIS — E1129 Type 2 diabetes mellitus with other diabetic kidney complication: Secondary | ICD-10-CM | POA: Diagnosis not present

## 2014-04-29 DIAGNOSIS — D631 Anemia in chronic kidney disease: Secondary | ICD-10-CM | POA: Diagnosis not present

## 2014-05-01 DIAGNOSIS — N186 End stage renal disease: Secondary | ICD-10-CM | POA: Diagnosis not present

## 2014-05-01 DIAGNOSIS — E1129 Type 2 diabetes mellitus with other diabetic kidney complication: Secondary | ICD-10-CM | POA: Diagnosis not present

## 2014-05-01 DIAGNOSIS — D631 Anemia in chronic kidney disease: Secondary | ICD-10-CM | POA: Diagnosis not present

## 2014-05-02 IMAGING — XA IR VASCULAR PROCEDURE
3 series · 4 of 4 positions shown · non-contrast
Comparison: none

[Series 2: venogram · 2 of 2 slices shown]
[im 1/2]
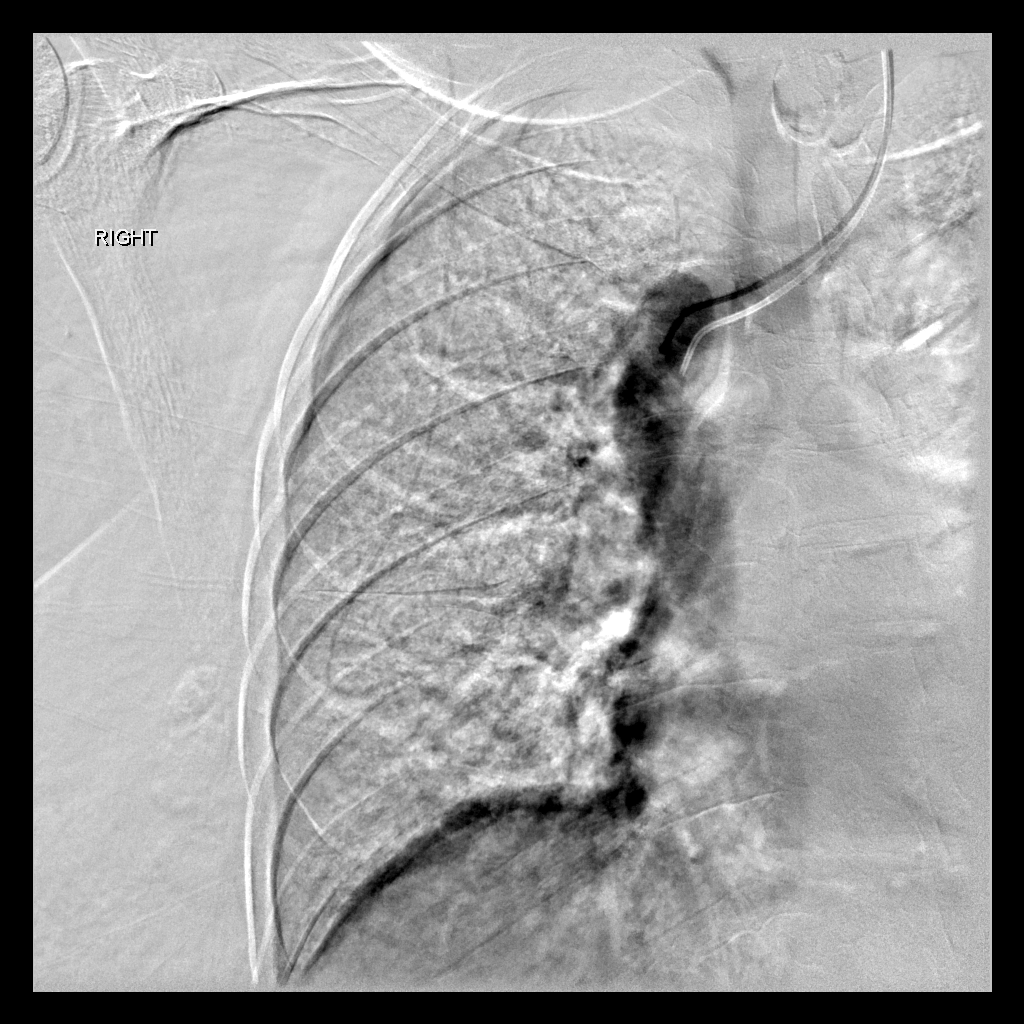
[im 2/2]
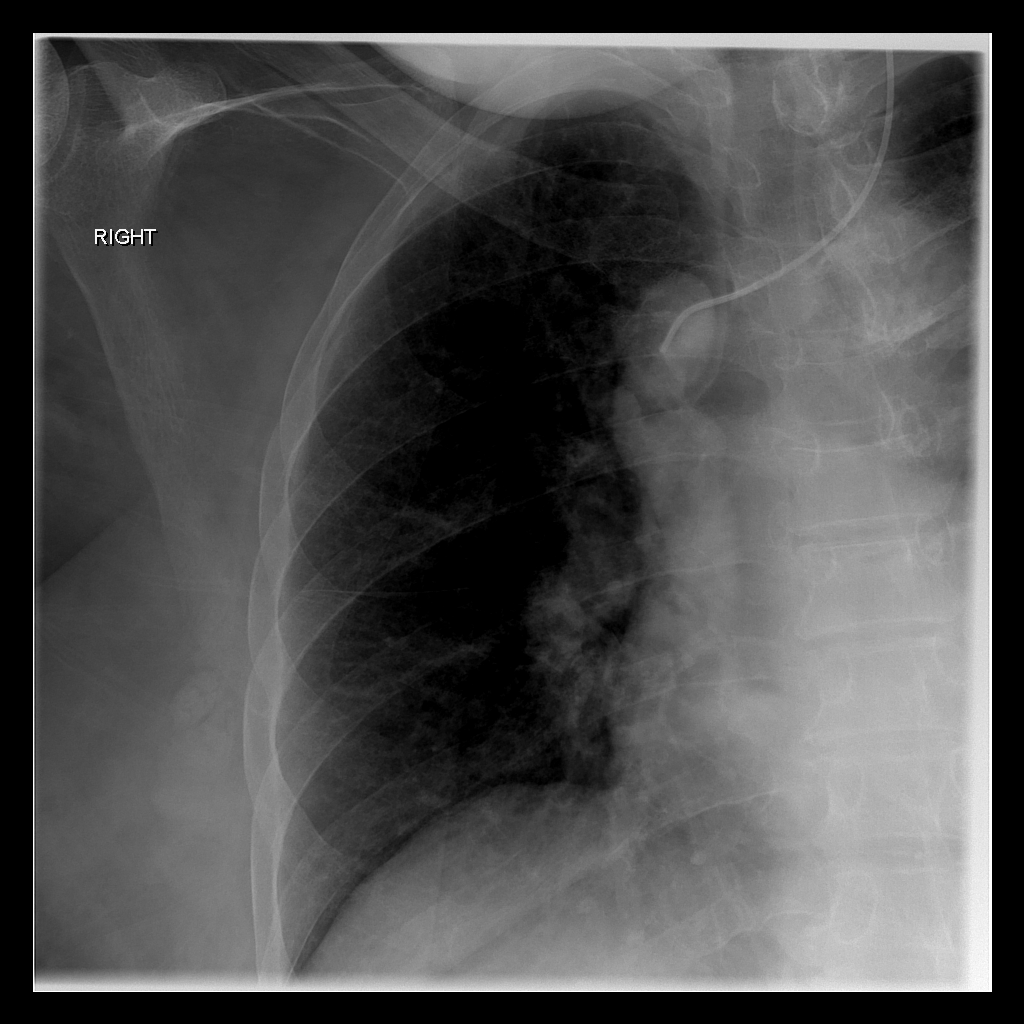

[Series 3: single · 1 of 1 slices shown (1 of 2)]
[im 1/1]
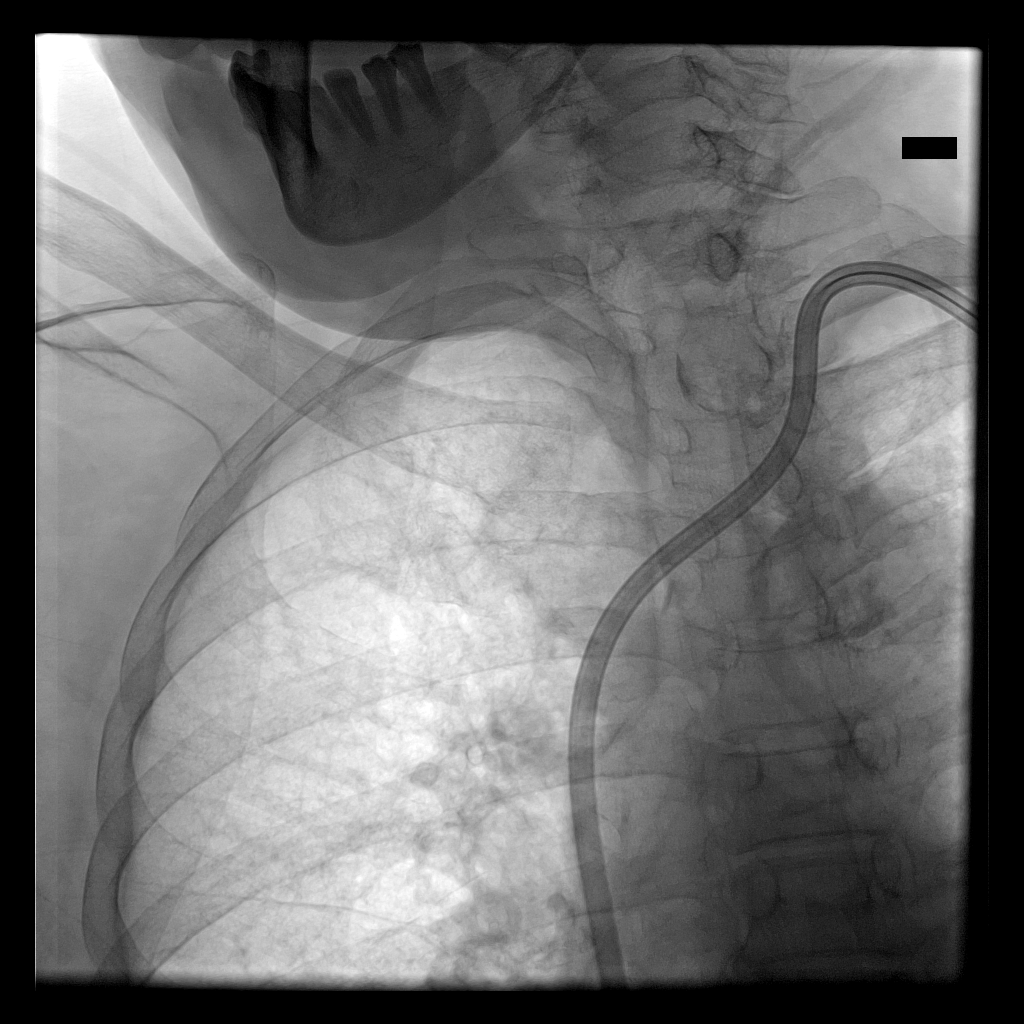

[Series 4: single · 1 of 1 slices shown (2 of 2)]
[im 1/1]
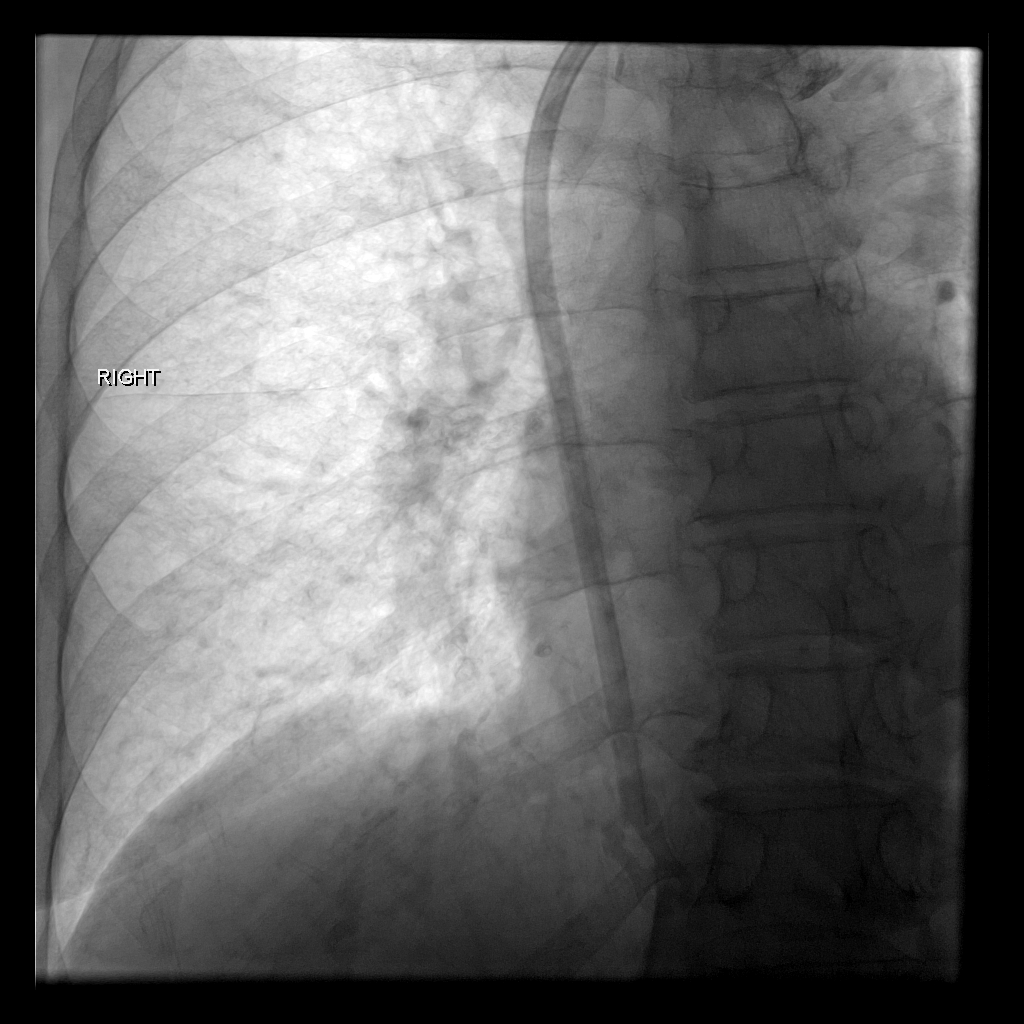

[4 of 4 positions shown; findings below may reference images not displayed]

IMAGES IMPORTED FROM THE SYNGO WORKFLOW SYSTEM
NO DICTATION FOR STUDY

## 2014-05-03 DIAGNOSIS — N186 End stage renal disease: Secondary | ICD-10-CM | POA: Diagnosis not present

## 2014-05-03 DIAGNOSIS — E1129 Type 2 diabetes mellitus with other diabetic kidney complication: Secondary | ICD-10-CM | POA: Diagnosis not present

## 2014-05-03 DIAGNOSIS — D631 Anemia in chronic kidney disease: Secondary | ICD-10-CM | POA: Diagnosis not present

## 2014-05-06 DIAGNOSIS — E1129 Type 2 diabetes mellitus with other diabetic kidney complication: Secondary | ICD-10-CM | POA: Diagnosis not present

## 2014-05-06 DIAGNOSIS — N186 End stage renal disease: Secondary | ICD-10-CM | POA: Diagnosis not present

## 2014-05-06 DIAGNOSIS — D631 Anemia in chronic kidney disease: Secondary | ICD-10-CM | POA: Diagnosis not present

## 2014-05-08 DIAGNOSIS — E1129 Type 2 diabetes mellitus with other diabetic kidney complication: Secondary | ICD-10-CM | POA: Diagnosis not present

## 2014-05-08 DIAGNOSIS — N186 End stage renal disease: Secondary | ICD-10-CM | POA: Diagnosis not present

## 2014-05-08 DIAGNOSIS — D631 Anemia in chronic kidney disease: Secondary | ICD-10-CM | POA: Diagnosis not present

## 2014-05-10 DIAGNOSIS — D631 Anemia in chronic kidney disease: Secondary | ICD-10-CM | POA: Diagnosis not present

## 2014-05-10 DIAGNOSIS — E1129 Type 2 diabetes mellitus with other diabetic kidney complication: Secondary | ICD-10-CM | POA: Diagnosis not present

## 2014-05-10 DIAGNOSIS — N186 End stage renal disease: Secondary | ICD-10-CM | POA: Diagnosis not present

## 2014-05-13 DIAGNOSIS — E1129 Type 2 diabetes mellitus with other diabetic kidney complication: Secondary | ICD-10-CM | POA: Diagnosis not present

## 2014-05-13 DIAGNOSIS — D631 Anemia in chronic kidney disease: Secondary | ICD-10-CM | POA: Diagnosis not present

## 2014-05-13 DIAGNOSIS — N186 End stage renal disease: Secondary | ICD-10-CM | POA: Diagnosis not present

## 2014-05-15 ENCOUNTER — Telehealth: Payer: Self-pay | Admitting: Orthopedic Surgery

## 2014-05-15 DIAGNOSIS — N186 End stage renal disease: Secondary | ICD-10-CM | POA: Diagnosis not present

## 2014-05-15 DIAGNOSIS — E1129 Type 2 diabetes mellitus with other diabetic kidney complication: Secondary | ICD-10-CM | POA: Diagnosis not present

## 2014-05-15 DIAGNOSIS — D631 Anemia in chronic kidney disease: Secondary | ICD-10-CM | POA: Diagnosis not present

## 2014-05-15 NOTE — Telephone Encounter (Signed)
Patient is calling asking for a refill on her pain medication HYDROcodone-acetaminophen (NORCO/VICODIN) 5-325 MG per tablet , please advise?

## 2014-05-16 ENCOUNTER — Other Ambulatory Visit: Payer: Self-pay | Admitting: *Deleted

## 2014-05-16 MED ORDER — HYDROCODONE-ACETAMINOPHEN 5-325 MG PO TABS: 1.0000 | ORAL_TABLET | Freq: Four times a day (QID) | ORAL | Status: DC | PRN

## 2014-05-17 DIAGNOSIS — E1129 Type 2 diabetes mellitus with other diabetic kidney complication: Secondary | ICD-10-CM | POA: Diagnosis not present

## 2014-05-17 DIAGNOSIS — N186 End stage renal disease: Secondary | ICD-10-CM | POA: Diagnosis not present

## 2014-05-17 DIAGNOSIS — D631 Anemia in chronic kidney disease: Secondary | ICD-10-CM | POA: Diagnosis not present

## 2014-05-20 ENCOUNTER — Other Ambulatory Visit: Payer: Self-pay | Admitting: *Deleted

## 2014-05-20 DIAGNOSIS — E1129 Type 2 diabetes mellitus with other diabetic kidney complication: Secondary | ICD-10-CM | POA: Diagnosis not present

## 2014-05-20 DIAGNOSIS — D631 Anemia in chronic kidney disease: Secondary | ICD-10-CM | POA: Diagnosis not present

## 2014-05-20 DIAGNOSIS — N186 End stage renal disease: Secondary | ICD-10-CM | POA: Diagnosis not present

## 2014-05-20 MED ORDER — HYDROCODONE-ACETAMINOPHEN 5-325 MG PO TABS: 1.0000 | ORAL_TABLET | Freq: Four times a day (QID) | ORAL | Status: DC | PRN

## 2014-05-20 NOTE — Telephone Encounter (Signed)
Patients son is calling back to see if pain medication has been advised?

## 2014-05-20 NOTE — Telephone Encounter (Signed)
Prescription available, patient aware  

## 2014-05-20 NOTE — Telephone Encounter (Signed)
Patients daughter in lawJoelene Millin [place]) picked up her Rx

## 2014-05-22 DIAGNOSIS — D631 Anemia in chronic kidney disease: Secondary | ICD-10-CM | POA: Diagnosis not present

## 2014-05-22 DIAGNOSIS — E1129 Type 2 diabetes mellitus with other diabetic kidney complication: Secondary | ICD-10-CM | POA: Diagnosis not present

## 2014-05-22 DIAGNOSIS — N186 End stage renal disease: Secondary | ICD-10-CM | POA: Diagnosis not present

## 2014-05-25 DIAGNOSIS — D631 Anemia in chronic kidney disease: Secondary | ICD-10-CM | POA: Diagnosis not present

## 2014-05-25 DIAGNOSIS — E1129 Type 2 diabetes mellitus with other diabetic kidney complication: Secondary | ICD-10-CM | POA: Diagnosis not present

## 2014-05-25 DIAGNOSIS — N186 End stage renal disease: Secondary | ICD-10-CM | POA: Diagnosis not present

## 2014-05-27 DIAGNOSIS — D631 Anemia in chronic kidney disease: Secondary | ICD-10-CM | POA: Diagnosis not present

## 2014-05-27 DIAGNOSIS — N186 End stage renal disease: Secondary | ICD-10-CM | POA: Diagnosis not present

## 2014-05-27 DIAGNOSIS — E1129 Type 2 diabetes mellitus with other diabetic kidney complication: Secondary | ICD-10-CM | POA: Diagnosis not present

## 2014-05-27 DIAGNOSIS — B999 Unspecified infectious disease: Secondary | ICD-10-CM | POA: Diagnosis not present

## 2014-05-29 DIAGNOSIS — Z992 Dependence on renal dialysis: Secondary | ICD-10-CM | POA: Diagnosis not present

## 2014-05-29 DIAGNOSIS — N186 End stage renal disease: Secondary | ICD-10-CM | POA: Diagnosis not present

## 2014-05-29 DIAGNOSIS — D631 Anemia in chronic kidney disease: Secondary | ICD-10-CM | POA: Diagnosis not present

## 2014-05-29 DIAGNOSIS — E1129 Type 2 diabetes mellitus with other diabetic kidney complication: Secondary | ICD-10-CM | POA: Diagnosis not present

## 2014-06-01 DIAGNOSIS — D631 Anemia in chronic kidney disease: Secondary | ICD-10-CM | POA: Diagnosis not present

## 2014-06-01 DIAGNOSIS — N186 End stage renal disease: Secondary | ICD-10-CM | POA: Diagnosis not present

## 2014-06-01 DIAGNOSIS — E1129 Type 2 diabetes mellitus with other diabetic kidney complication: Secondary | ICD-10-CM | POA: Diagnosis not present

## 2014-06-19 DIAGNOSIS — E1129 Type 2 diabetes mellitus with other diabetic kidney complication: Secondary | ICD-10-CM | POA: Diagnosis not present

## 2014-06-19 DIAGNOSIS — E785 Hyperlipidemia, unspecified: Secondary | ICD-10-CM | POA: Diagnosis not present

## 2014-06-27 DIAGNOSIS — E119 Type 2 diabetes mellitus without complications: Secondary | ICD-10-CM | POA: Diagnosis not present

## 2014-06-27 DIAGNOSIS — M19012 Primary osteoarthritis, left shoulder: Secondary | ICD-10-CM | POA: Diagnosis not present

## 2014-06-27 DIAGNOSIS — Z89512 Acquired absence of left leg below knee: Secondary | ICD-10-CM | POA: Diagnosis not present

## 2014-06-27 DIAGNOSIS — M25512 Pain in left shoulder: Secondary | ICD-10-CM | POA: Diagnosis not present

## 2014-06-27 DIAGNOSIS — Z79891 Long term (current) use of opiate analgesic: Secondary | ICD-10-CM | POA: Diagnosis not present

## 2014-06-29 DIAGNOSIS — N186 End stage renal disease: Secondary | ICD-10-CM | POA: Diagnosis not present

## 2014-06-29 DIAGNOSIS — Z992 Dependence on renal dialysis: Secondary | ICD-10-CM | POA: Diagnosis not present

## 2014-07-01 DIAGNOSIS — E1129 Type 2 diabetes mellitus with other diabetic kidney complication: Secondary | ICD-10-CM | POA: Diagnosis not present

## 2014-07-01 DIAGNOSIS — D631 Anemia in chronic kidney disease: Secondary | ICD-10-CM | POA: Diagnosis not present

## 2014-07-01 DIAGNOSIS — N186 End stage renal disease: Secondary | ICD-10-CM | POA: Diagnosis not present

## 2014-07-02 DIAGNOSIS — H40033 Anatomical narrow angle, bilateral: Secondary | ICD-10-CM | POA: Diagnosis not present

## 2014-07-02 DIAGNOSIS — E119 Type 2 diabetes mellitus without complications: Secondary | ICD-10-CM | POA: Diagnosis not present

## 2014-07-03 DIAGNOSIS — N186 End stage renal disease: Secondary | ICD-10-CM | POA: Diagnosis not present

## 2014-07-03 DIAGNOSIS — D631 Anemia in chronic kidney disease: Secondary | ICD-10-CM | POA: Diagnosis not present

## 2014-07-03 DIAGNOSIS — E1129 Type 2 diabetes mellitus with other diabetic kidney complication: Secondary | ICD-10-CM | POA: Diagnosis not present

## 2014-07-05 DIAGNOSIS — D631 Anemia in chronic kidney disease: Secondary | ICD-10-CM | POA: Diagnosis not present

## 2014-07-05 DIAGNOSIS — N186 End stage renal disease: Secondary | ICD-10-CM | POA: Diagnosis not present

## 2014-07-05 DIAGNOSIS — E1129 Type 2 diabetes mellitus with other diabetic kidney complication: Secondary | ICD-10-CM | POA: Diagnosis not present

## 2014-07-08 DIAGNOSIS — D631 Anemia in chronic kidney disease: Secondary | ICD-10-CM | POA: Diagnosis not present

## 2014-07-08 DIAGNOSIS — E1129 Type 2 diabetes mellitus with other diabetic kidney complication: Secondary | ICD-10-CM | POA: Diagnosis not present

## 2014-07-08 DIAGNOSIS — N186 End stage renal disease: Secondary | ICD-10-CM | POA: Diagnosis not present

## 2014-07-10 DIAGNOSIS — E1129 Type 2 diabetes mellitus with other diabetic kidney complication: Secondary | ICD-10-CM | POA: Diagnosis not present

## 2014-07-10 DIAGNOSIS — D631 Anemia in chronic kidney disease: Secondary | ICD-10-CM | POA: Diagnosis not present

## 2014-07-10 DIAGNOSIS — N186 End stage renal disease: Secondary | ICD-10-CM | POA: Diagnosis not present

## 2014-07-11 ENCOUNTER — Other Ambulatory Visit (HOSPITAL_COMMUNITY): Payer: Self-pay | Admitting: Internal Medicine

## 2014-07-11 DIAGNOSIS — Z0001 Encounter for general adult medical examination with abnormal findings: Secondary | ICD-10-CM | POA: Diagnosis not present

## 2014-07-11 DIAGNOSIS — N186 End stage renal disease: Secondary | ICD-10-CM | POA: Diagnosis not present

## 2014-07-11 DIAGNOSIS — D649 Anemia, unspecified: Secondary | ICD-10-CM | POA: Diagnosis not present

## 2014-07-11 DIAGNOSIS — E1140 Type 2 diabetes mellitus with diabetic neuropathy, unspecified: Secondary | ICD-10-CM | POA: Diagnosis not present

## 2014-07-11 DIAGNOSIS — Z1231 Encounter for screening mammogram for malignant neoplasm of breast: Secondary | ICD-10-CM

## 2014-07-12 DIAGNOSIS — N186 End stage renal disease: Secondary | ICD-10-CM | POA: Diagnosis not present

## 2014-07-12 DIAGNOSIS — D631 Anemia in chronic kidney disease: Secondary | ICD-10-CM | POA: Diagnosis not present

## 2014-07-12 DIAGNOSIS — E1129 Type 2 diabetes mellitus with other diabetic kidney complication: Secondary | ICD-10-CM | POA: Diagnosis not present

## 2014-07-15 DIAGNOSIS — E1129 Type 2 diabetes mellitus with other diabetic kidney complication: Secondary | ICD-10-CM | POA: Diagnosis not present

## 2014-07-15 DIAGNOSIS — D631 Anemia in chronic kidney disease: Secondary | ICD-10-CM | POA: Diagnosis not present

## 2014-07-15 DIAGNOSIS — N186 End stage renal disease: Secondary | ICD-10-CM | POA: Diagnosis not present

## 2014-07-17 DIAGNOSIS — N186 End stage renal disease: Secondary | ICD-10-CM | POA: Diagnosis not present

## 2014-07-17 DIAGNOSIS — D631 Anemia in chronic kidney disease: Secondary | ICD-10-CM | POA: Diagnosis not present

## 2014-07-17 DIAGNOSIS — E1129 Type 2 diabetes mellitus with other diabetic kidney complication: Secondary | ICD-10-CM | POA: Diagnosis not present

## 2014-07-17 IMAGING — RF DG FEMUR 2V*L*
1 series · 6 of 6 positions shown · non-contrast
Comparison: Left femur radiographs - 05/19/2012

CLINICAL DATA: Post retrograde line and nail of the left femur

DG C-ARM 61-120 MIN, LEFT FEMUR - 2 VIEW

[Series 1: run · 6 of 6 slices shown]
[im 1/6]
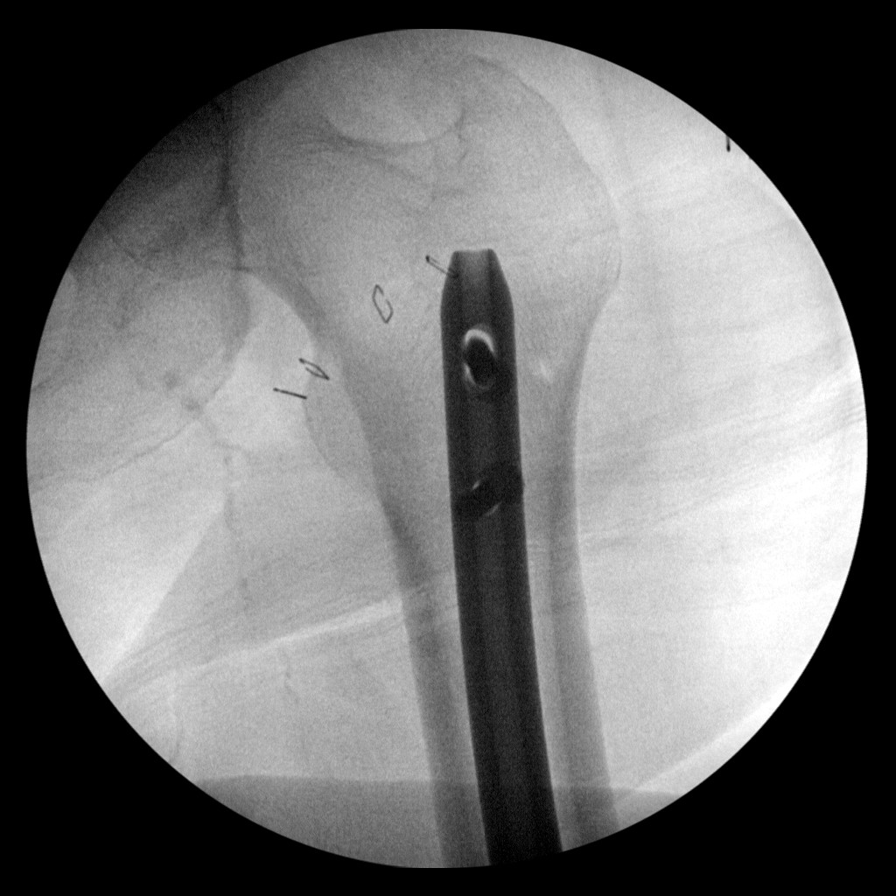
[im 2/6]
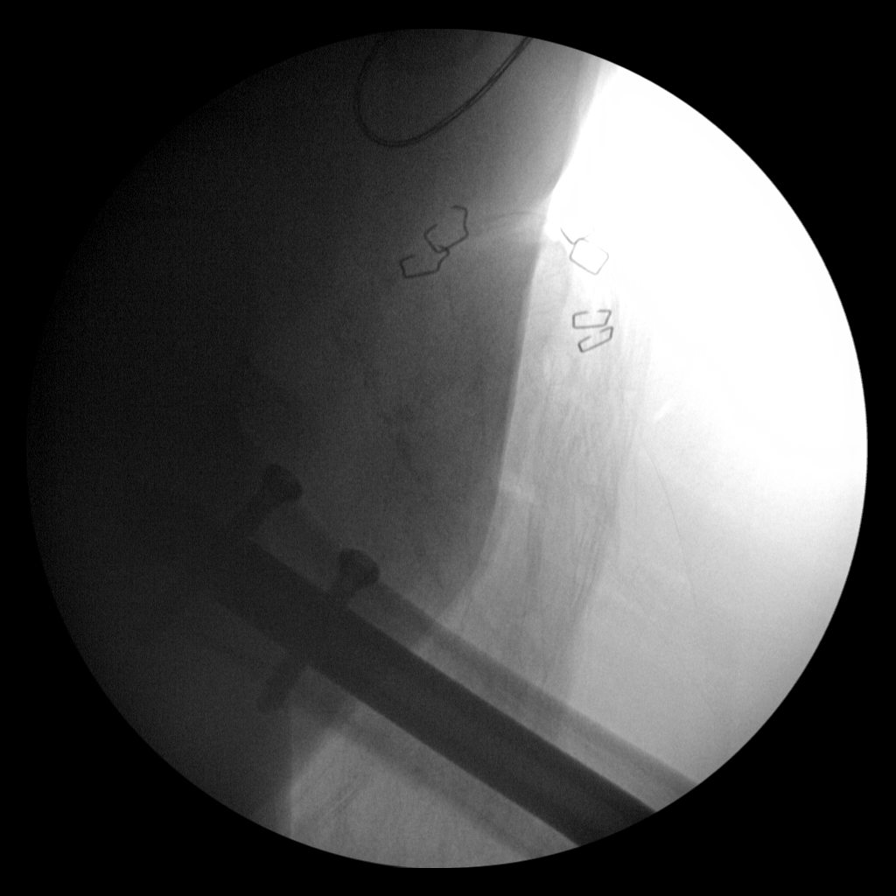
[im 3/6]
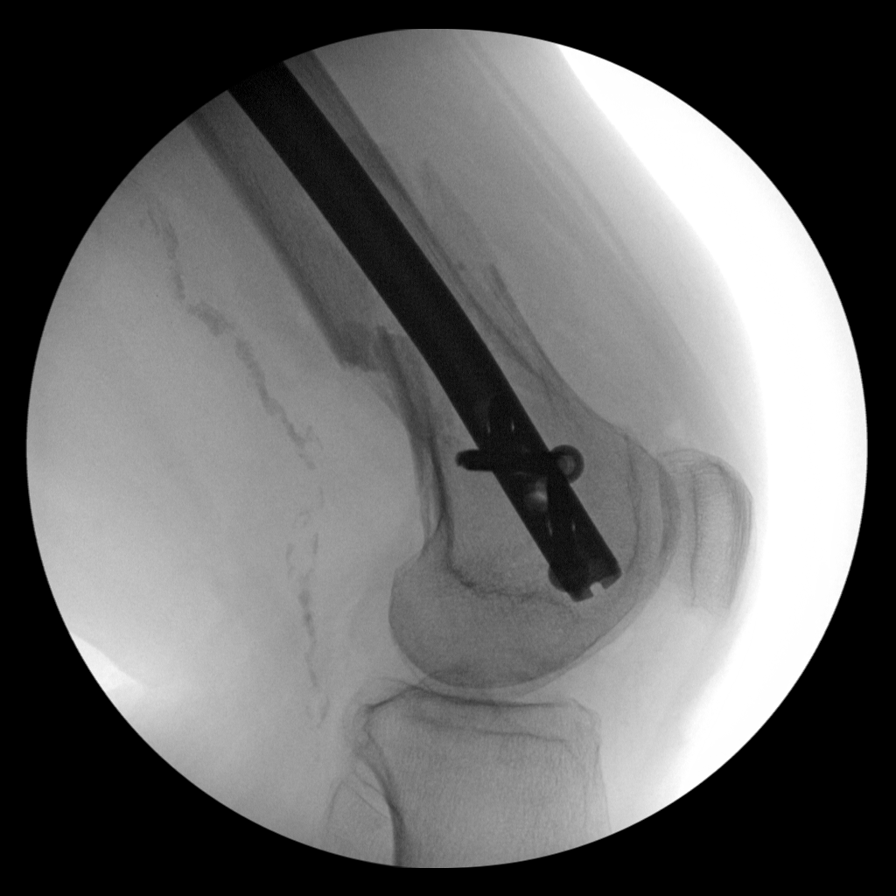
[im 4/6]
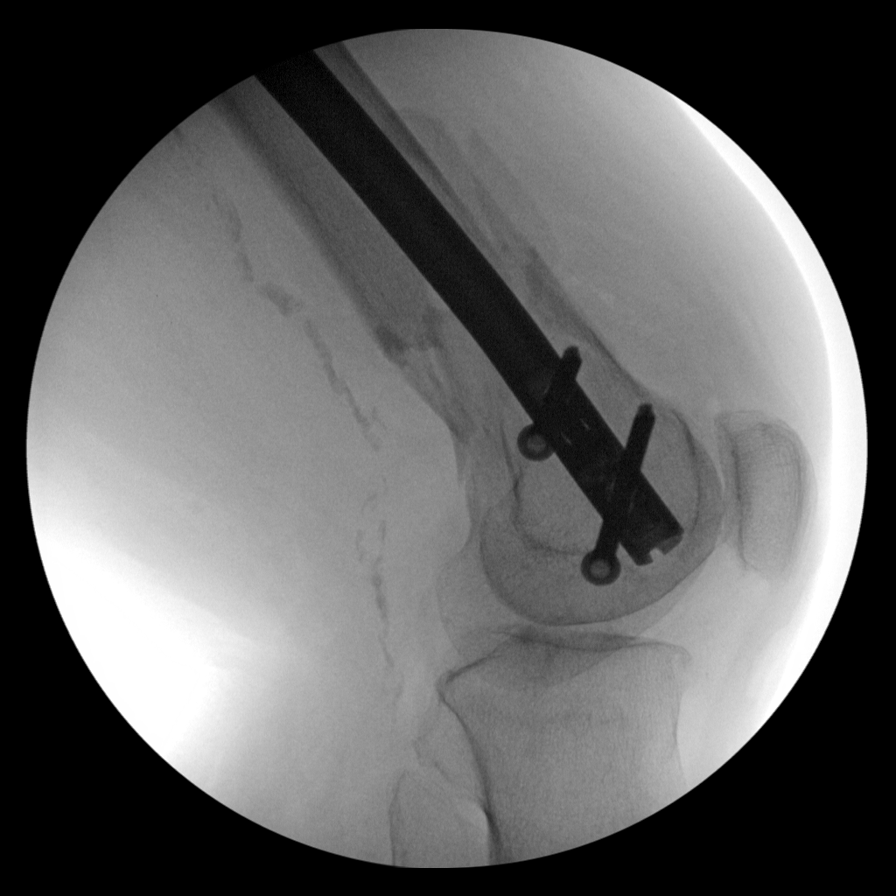
[im 5/6]
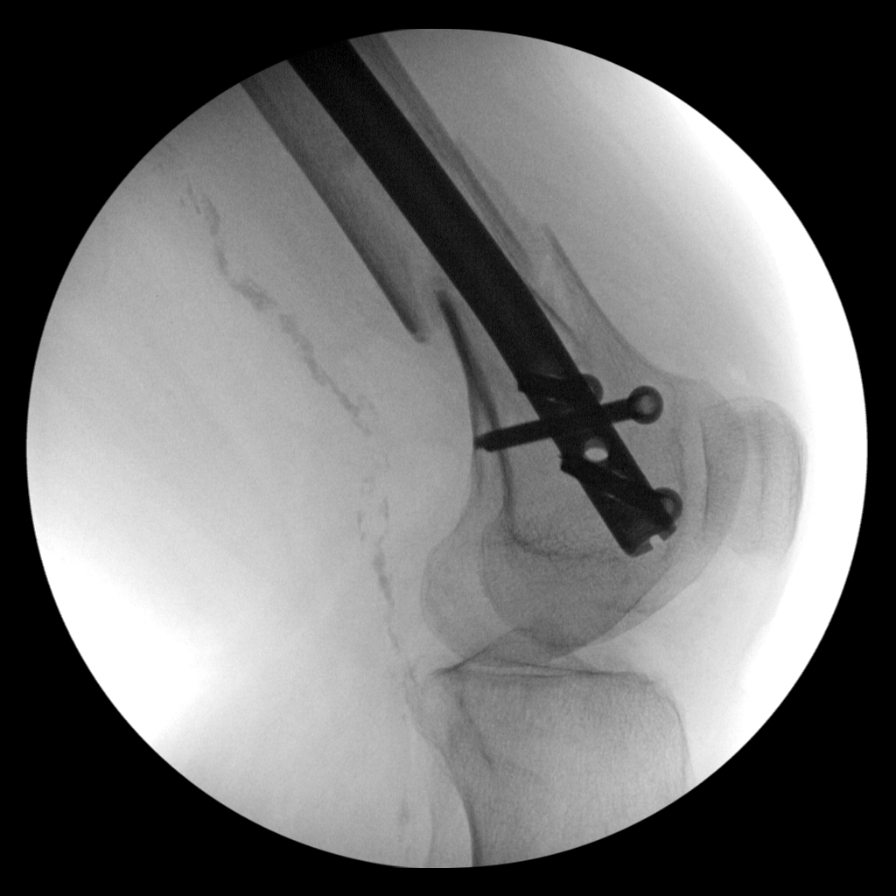
[im 6/6]
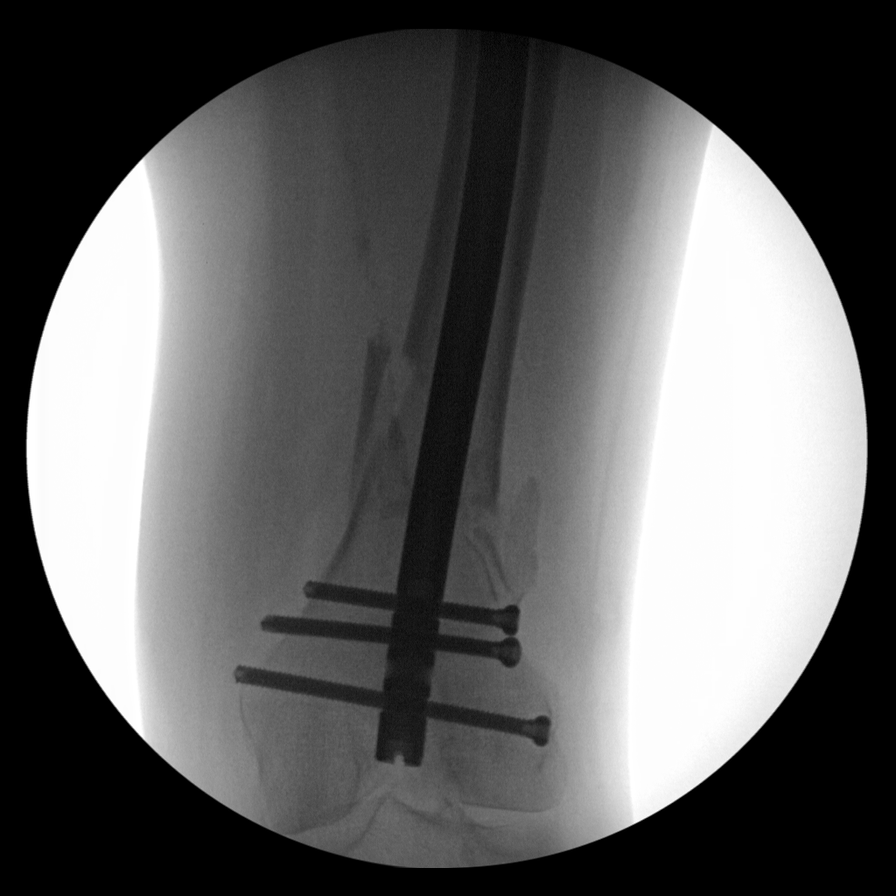

[6 of 6 positions shown; findings below may reference images not displayed]

FINDINGS: Six spot intraoperative fluoroscopic images of the left femur are
provided for review.  Images demonstrate retrograde intramedullary
nail fixation of previously noted distal diaphyseal/metaphyseal
comminuted femur fracture. Alignment is much improved.

The distal end of the intramedullary rod is transfixed with three
cancellous screws.  The proximal end of the femoral rods transfixed
with two cancellous screws.

Skin staples overlying the proximal thigh. No radiopaque foreign
body.
IMPRESSION: Post intramedullary rod fixation of comminuted distal
diaphyseal/metaphyseal femoral fracture.

## 2014-07-17 IMAGING — CR DG FEMUR 2+V PORT*L*
4 series · 4 of 4 positions shown · non-contrast
Comparison: Intraoperative fluoro spot images 05/20/2012.

CLINICAL DATA: Status post ORIF.

PORTABLE LEFT FEMUR - 2 VIEW

[AP (1 of 2)]
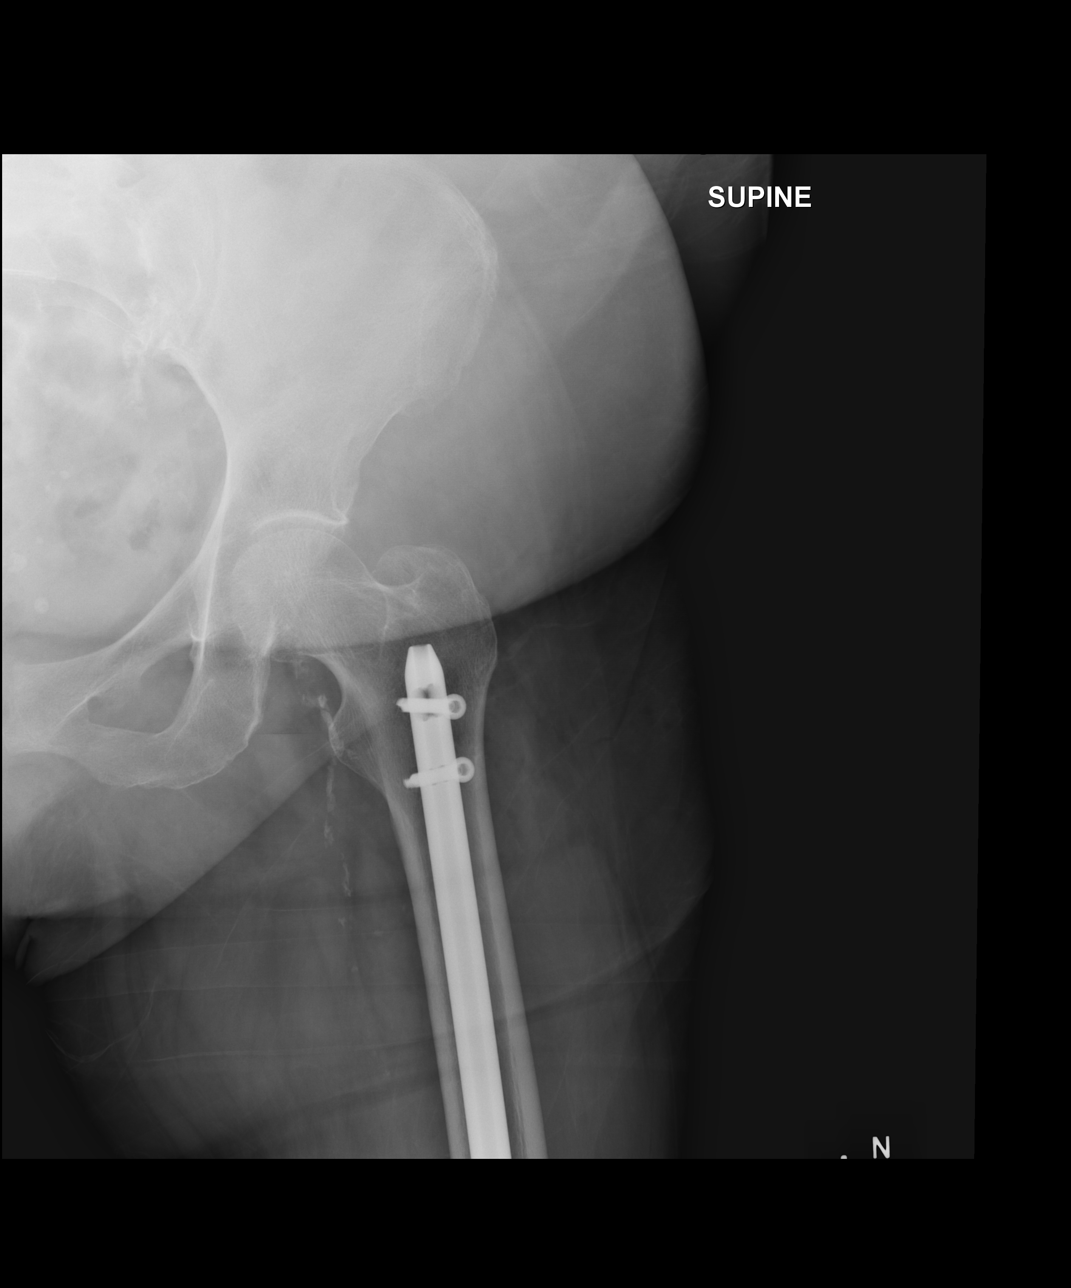

[xtable lateral (1 of 2)]
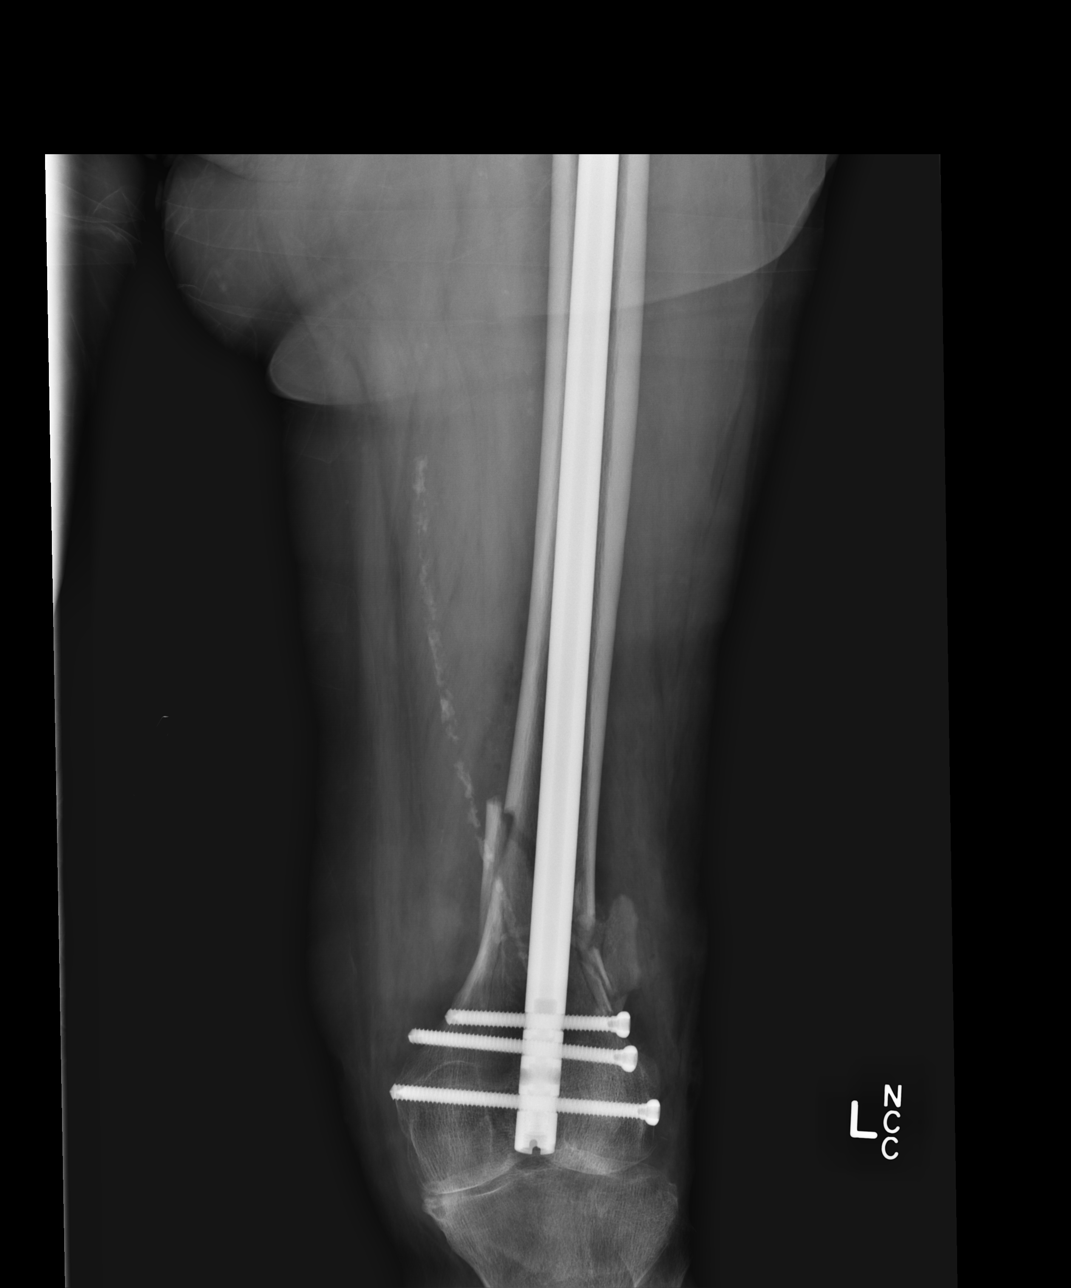

[AP (2 of 2)]
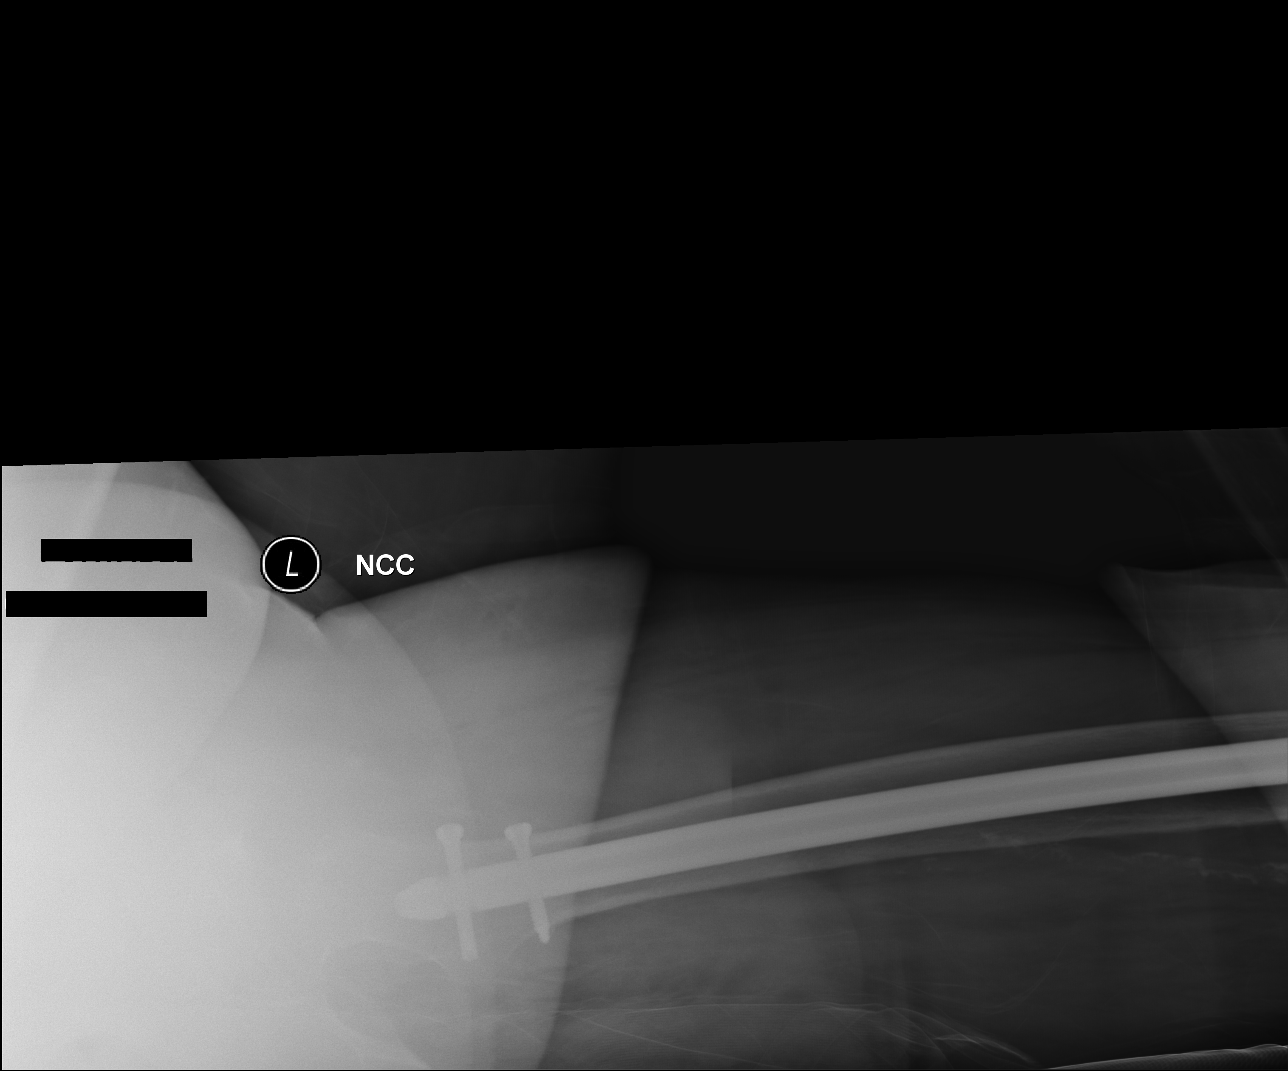

[xtable lateral (2 of 2)]
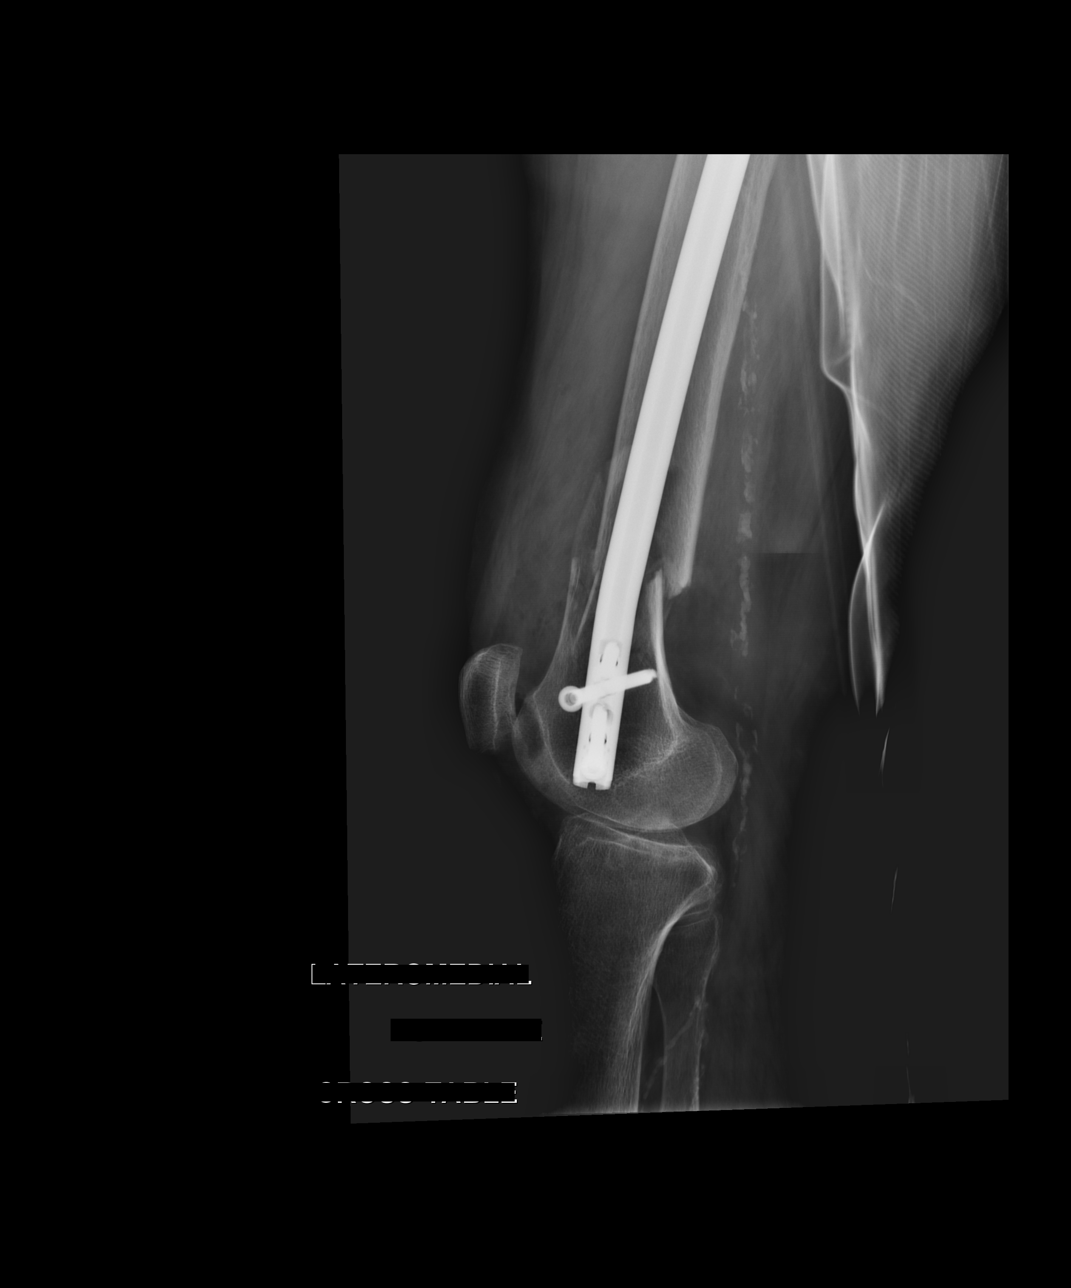

[4 of 4 positions shown; findings below may reference images not displayed]

FINDINGS: The patient is status post placement of an intramedullary
rod in the left femur.  Three distal and two proximal interlocking
screws are in place.  There is slight anterior displacement of
approximately one cortex width of the distal comminuted fracture.
A moderate sized joint effusion is present.  Atherosclerotic
calcifications are present within the femoral artery.
IMPRESSION: 1.  Near anatomic reduction of the comminuted distal femur fracture
status post ORIF.
2.  No radiographic evidence for complication.
3.  Atherosclerosis.

## 2014-07-18 ENCOUNTER — Ambulatory Visit (HOSPITAL_COMMUNITY)
Admission: RE | Admit: 2014-07-18 | Discharge: 2014-07-18 | Disposition: A | Discharge status: Home / Self Care | Payer: Medicare Other | Source: Ambulatory Visit | Attending: Internal Medicine | Admitting: Internal Medicine

## 2014-07-18 DIAGNOSIS — Z1231 Encounter for screening mammogram for malignant neoplasm of breast: Secondary | ICD-10-CM

## 2014-07-19 DIAGNOSIS — N186 End stage renal disease: Secondary | ICD-10-CM | POA: Diagnosis not present

## 2014-07-19 DIAGNOSIS — D631 Anemia in chronic kidney disease: Secondary | ICD-10-CM | POA: Diagnosis not present

## 2014-07-19 DIAGNOSIS — E1129 Type 2 diabetes mellitus with other diabetic kidney complication: Secondary | ICD-10-CM | POA: Diagnosis not present

## 2014-07-22 DIAGNOSIS — N186 End stage renal disease: Secondary | ICD-10-CM | POA: Diagnosis not present

## 2014-07-22 DIAGNOSIS — E1129 Type 2 diabetes mellitus with other diabetic kidney complication: Secondary | ICD-10-CM | POA: Diagnosis not present

## 2014-07-22 DIAGNOSIS — D631 Anemia in chronic kidney disease: Secondary | ICD-10-CM | POA: Diagnosis not present

## 2014-07-24 DIAGNOSIS — N186 End stage renal disease: Secondary | ICD-10-CM | POA: Diagnosis not present

## 2014-07-24 DIAGNOSIS — D631 Anemia in chronic kidney disease: Secondary | ICD-10-CM | POA: Diagnosis not present

## 2014-07-24 DIAGNOSIS — E1129 Type 2 diabetes mellitus with other diabetic kidney complication: Secondary | ICD-10-CM | POA: Diagnosis not present

## 2014-07-26 DIAGNOSIS — D631 Anemia in chronic kidney disease: Secondary | ICD-10-CM | POA: Diagnosis not present

## 2014-07-26 DIAGNOSIS — N186 End stage renal disease: Secondary | ICD-10-CM | POA: Diagnosis not present

## 2014-07-26 DIAGNOSIS — E1129 Type 2 diabetes mellitus with other diabetic kidney complication: Secondary | ICD-10-CM | POA: Diagnosis not present

## 2014-07-28 DIAGNOSIS — I739 Peripheral vascular disease, unspecified: Secondary | ICD-10-CM | POA: Diagnosis not present

## 2014-07-28 DIAGNOSIS — B351 Tinea unguium: Secondary | ICD-10-CM | POA: Diagnosis not present

## 2014-07-28 DIAGNOSIS — E1342 Other specified diabetes mellitus with diabetic polyneuropathy: Secondary | ICD-10-CM | POA: Diagnosis not present

## 2014-07-28 DIAGNOSIS — Z89512 Acquired absence of left leg below knee: Secondary | ICD-10-CM | POA: Diagnosis not present

## 2014-07-28 DIAGNOSIS — L853 Xerosis cutis: Secondary | ICD-10-CM | POA: Diagnosis not present

## 2014-07-28 DIAGNOSIS — Z992 Dependence on renal dialysis: Secondary | ICD-10-CM | POA: Diagnosis not present

## 2014-07-28 DIAGNOSIS — N186 End stage renal disease: Secondary | ICD-10-CM | POA: Diagnosis not present

## 2014-07-29 DIAGNOSIS — N186 End stage renal disease: Secondary | ICD-10-CM | POA: Diagnosis not present

## 2014-07-29 DIAGNOSIS — D631 Anemia in chronic kidney disease: Secondary | ICD-10-CM | POA: Diagnosis not present

## 2014-07-29 DIAGNOSIS — E1129 Type 2 diabetes mellitus with other diabetic kidney complication: Secondary | ICD-10-CM | POA: Diagnosis not present

## 2014-08-14 DIAGNOSIS — E119 Type 2 diabetes mellitus without complications: Secondary | ICD-10-CM | POA: Diagnosis not present

## 2014-08-14 DIAGNOSIS — Z79899 Other long term (current) drug therapy: Secondary | ICD-10-CM | POA: Diagnosis not present

## 2014-08-14 DIAGNOSIS — G546 Phantom limb syndrome with pain: Secondary | ICD-10-CM | POA: Diagnosis not present

## 2014-08-14 DIAGNOSIS — M25512 Pain in left shoulder: Secondary | ICD-10-CM | POA: Diagnosis not present

## 2014-08-28 DIAGNOSIS — Z992 Dependence on renal dialysis: Secondary | ICD-10-CM | POA: Diagnosis not present

## 2014-08-28 DIAGNOSIS — N186 End stage renal disease: Secondary | ICD-10-CM | POA: Diagnosis not present

## 2014-08-28 DIAGNOSIS — E1129 Type 2 diabetes mellitus with other diabetic kidney complication: Secondary | ICD-10-CM | POA: Diagnosis not present

## 2014-08-30 DIAGNOSIS — D509 Iron deficiency anemia, unspecified: Secondary | ICD-10-CM | POA: Diagnosis not present

## 2014-08-30 DIAGNOSIS — N2581 Secondary hyperparathyroidism of renal origin: Secondary | ICD-10-CM | POA: Diagnosis not present

## 2014-08-30 DIAGNOSIS — N186 End stage renal disease: Secondary | ICD-10-CM | POA: Diagnosis not present

## 2014-08-30 DIAGNOSIS — D631 Anemia in chronic kidney disease: Secondary | ICD-10-CM | POA: Diagnosis not present

## 2014-09-01 DIAGNOSIS — M19012 Primary osteoarthritis, left shoulder: Secondary | ICD-10-CM | POA: Diagnosis not present

## 2014-09-01 DIAGNOSIS — M25512 Pain in left shoulder: Secondary | ICD-10-CM | POA: Diagnosis not present

## 2014-09-02 DIAGNOSIS — N2581 Secondary hyperparathyroidism of renal origin: Secondary | ICD-10-CM | POA: Diagnosis not present

## 2014-09-02 DIAGNOSIS — D631 Anemia in chronic kidney disease: Secondary | ICD-10-CM | POA: Diagnosis not present

## 2014-09-02 DIAGNOSIS — N186 End stage renal disease: Secondary | ICD-10-CM | POA: Diagnosis not present

## 2014-09-02 DIAGNOSIS — D509 Iron deficiency anemia, unspecified: Secondary | ICD-10-CM | POA: Diagnosis not present

## 2014-09-04 DIAGNOSIS — D509 Iron deficiency anemia, unspecified: Secondary | ICD-10-CM | POA: Diagnosis not present

## 2014-09-04 DIAGNOSIS — D631 Anemia in chronic kidney disease: Secondary | ICD-10-CM | POA: Diagnosis not present

## 2014-09-04 DIAGNOSIS — N2581 Secondary hyperparathyroidism of renal origin: Secondary | ICD-10-CM | POA: Diagnosis not present

## 2014-09-04 DIAGNOSIS — N186 End stage renal disease: Secondary | ICD-10-CM | POA: Diagnosis not present

## 2014-09-06 DIAGNOSIS — D631 Anemia in chronic kidney disease: Secondary | ICD-10-CM | POA: Diagnosis not present

## 2014-09-06 DIAGNOSIS — N186 End stage renal disease: Secondary | ICD-10-CM | POA: Diagnosis not present

## 2014-09-06 DIAGNOSIS — N2581 Secondary hyperparathyroidism of renal origin: Secondary | ICD-10-CM | POA: Diagnosis not present

## 2014-09-06 DIAGNOSIS — D509 Iron deficiency anemia, unspecified: Secondary | ICD-10-CM | POA: Diagnosis not present

## 2014-09-09 DIAGNOSIS — N186 End stage renal disease: Secondary | ICD-10-CM | POA: Diagnosis not present

## 2014-09-09 DIAGNOSIS — N2581 Secondary hyperparathyroidism of renal origin: Secondary | ICD-10-CM | POA: Diagnosis not present

## 2014-09-09 DIAGNOSIS — D631 Anemia in chronic kidney disease: Secondary | ICD-10-CM | POA: Diagnosis not present

## 2014-09-09 DIAGNOSIS — D509 Iron deficiency anemia, unspecified: Secondary | ICD-10-CM | POA: Diagnosis not present

## 2014-09-11 DIAGNOSIS — N186 End stage renal disease: Secondary | ICD-10-CM | POA: Diagnosis not present

## 2014-09-11 DIAGNOSIS — D631 Anemia in chronic kidney disease: Secondary | ICD-10-CM | POA: Diagnosis not present

## 2014-09-11 DIAGNOSIS — D509 Iron deficiency anemia, unspecified: Secondary | ICD-10-CM | POA: Diagnosis not present

## 2014-09-11 DIAGNOSIS — N2581 Secondary hyperparathyroidism of renal origin: Secondary | ICD-10-CM | POA: Diagnosis not present

## 2014-09-13 DIAGNOSIS — N186 End stage renal disease: Secondary | ICD-10-CM | POA: Diagnosis not present

## 2014-09-13 DIAGNOSIS — D631 Anemia in chronic kidney disease: Secondary | ICD-10-CM | POA: Diagnosis not present

## 2014-09-13 DIAGNOSIS — D509 Iron deficiency anemia, unspecified: Secondary | ICD-10-CM | POA: Diagnosis not present

## 2014-09-13 DIAGNOSIS — N2581 Secondary hyperparathyroidism of renal origin: Secondary | ICD-10-CM | POA: Diagnosis not present

## 2014-09-16 DIAGNOSIS — D631 Anemia in chronic kidney disease: Secondary | ICD-10-CM | POA: Diagnosis not present

## 2014-09-16 DIAGNOSIS — N186 End stage renal disease: Secondary | ICD-10-CM | POA: Diagnosis not present

## 2014-09-16 DIAGNOSIS — D509 Iron deficiency anemia, unspecified: Secondary | ICD-10-CM | POA: Diagnosis not present

## 2014-09-16 DIAGNOSIS — N2581 Secondary hyperparathyroidism of renal origin: Secondary | ICD-10-CM | POA: Diagnosis not present

## 2014-09-16 NOTE — Op Note (Signed)
PATIENT NAME:  [name], [name] MR#:  [id number] DATE OF BIRTH:  [birth year]  DATE OF PROCEDURE:  02/02/2012  PREOPERATIVE DIAGNOSES:  1. End-stage renal disease.  2. Multiple failed previous dialysis attempts.  3. Hypertension.   POSTOPERATIVE DIAGNOSES:  1. End-stage renal disease.  2. Multiple failed previous dialysis attempts.  3. Hypertension.   PROCEDURE: Left forearm AV graft.   SURGEON: Algernon Huxley, MD    ANESTHESIA: General.  ESTIMATED BLOOD LOSS: 50 mL.   INDICATION FOR PROCEDURE: The patient is a 69 year old African American female who has end-stage renal disease. [name] currently catheter dependent. I attempted a left brachial basilic AV fistula including revision and percutaneous interventions but this has failed. It is not usable for dialysis access. New dialysis access will be needed. She does not have residual superficial veins that are adequate for fistula creation.   DESCRIPTION OF PROCEDURE: The patient was brought to the operating room and after an adequate level of general anesthesia was obtained, the left upper extremity was sterilely prepped and draped and a sterile surgical field was created. A transverse incision was created two fingerbreadths below the antecubital fossa and I dissected out the palpable pulse at this location. She had a known high bifurcation of her brachial and it was unclear if this was the radial or ulnar arteries initially. This was dissected out. There was a large branch coming off this level. It was likely an interosseous branch. This was also encircled with a vessel loop. The corresponding vein in this location was patent. It was a decent size, although not large. I did think it was worthwhile to use this for our outflow. This was dissected free to be controlled with bulldog clamps at the time of anastomosis. This would lie medial and the artery lateral for anastomotic purposes. A 40 cm, 6 mm diameter Propatent PTFE graft was tunneled in the  forearm with small counterincision made in the distal forearm and the line being kept superior for orientation. The patient was systemically heparinized. The arterial anastomosis was created with a running CV-6 suture in the usual fashion after the arteriotomy was created and the graft was cut and beveled to an appropriate length. I then controlled the vein with bulldogs. A venotomy was performed with an 11 blade and extended with Potts scissors and the graft was cut and beveled to match this venotomy. A CV-6 suture was again used for the vein. Two 6-0 Prolene patch sutures were used on the venous anastomosis and the vein. The vessel was flushed and de-aired prior to release of control. On release there was a nice pulsatile flow within the AV graft that was easily palpable. It was then irrigated. Surgicel and Evicyl topical hemostatic agents were placed. It was closed with 3-0 Vicryl and 4-0 Monocryl. The counterincision was closed with 3-0 Vicryl and 4-0 Monocryl. Dermabond was placed as a dressing. The patient tolerated the procedure well and was taken to the recovery room in stable condition.   ____________________________ Algernon Huxley, MD jsd:drc D: 02/02/2012 16:15:47 ET T: 02/03/2012 08:47:22 ET JOB#: [id number]  cc: Algernon Huxley, MD, <Dictator> Algernon Huxley MD ELECTRONICALLY SIGNED 02/05/2012 14:41

## 2014-09-16 NOTE — Op Note (Signed)
PATIENT NAME:  [name], [name] MR#:  [id number] DATE OF BIRTH:  [birth year]  DATE OF PROCEDURE:  02/29/2012  PREOPERATIVE DIAGNOSES:  1. Endstage renal disease.  2. Gram-negative rod bacteremia.  3. 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   POSTOPERATIVE DIAGNOSES:  1. Endstage renal disease.  2. Gram-negative rod bacteremia.  3. 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   PROCEDURE: 1. Ultrasound guidance for vascular access, right femoral vein.  2. Placement of right femoral Trialysis-type dialysis catheter.  3. Removal of right jugular PermCath.  4. Removal of infected left arm AV graft.  5. Repair of left brachial artery for pseudoaneurysm.   SURGEON: Algernon Huxley, M.D.   ANESTHESIA: General.   ESTIMATED BLOOD LOSS: Approximately 200 mL.   INDICATION FOR PROCEDURE: 69 year old African American female with endstage renal disease. She had a graft placed approximately six weeks ago. She had had other access in the left arm that had failed. She was admitted two days prior. She had bacteremia which was gram-negative rods that was felt to be from either urinary or pulmonary source. Apparently she had positive blood cultures about three weeks ago at an outside institution and we were not initially aware of these. At this point she has a large pseudoaneurysm near the arterial anastomosis of her graft and her graft has to be presumed to be infected as does her existing PermCath, which has been in for many months. These best be removed and her a temporary dialysis catheter will be placed for initiation of hemodialysis while in the hospital. I discussed with the patient if her graft was well incorporated and did not appear infected, we may be able to salvage it, but I thought this was somewhat unlikely. Risks and benefits were discussed. Informed consent was obtained.   DESCRIPTION OF PROCEDURE: The patient was brought to the operative  suite and after an adequate level of general anesthesia was obtained, initially the right groin was sterilely prepped and draped and a sterile surgical field was created.  The right femoral vein was visualized with ultrasound and was found to be widely patent. It was then accessed under direct ultrasound guidance without difficulty with a Seldinger needle and a J-wire was placed. After skin and dilatation, the Trialysis-type dialysis catheter 30 cm in length was placed over the wire and the wire was removed. All three lumens withdrew dark red nonpulsatile blood and flushed easily with sterile saline. It was secured with 3 nylon sutures and a sterile dressing was placed.   I then turned my attention to the PermCath. The jugular PermCath was then dissected free with hemostats and a #11 blade was used to transect the fibrous sheath connected to the cuff. The catheter was then removed in its entirety. Pressure was held and sterile dressing was placed.   At this point, we turned our attention to the left arm. The left arm was sterilely prepped and draped, and a sterile surgical field was created. A long incision was created to allow [place] to gain proximal control of the brachial artery prior to the pseudoaneurysm. This was tedious as she has had many operations in this area, but we were able to gain control to the artery.  A vessel loop was placed around the artery. I then identified some of the graft. The arterial anastomosis was clearly disrupted as we opened the pseudoaneurysm. A large amount of thrombus was removed. There graft was taken off of the artery. Control was  gained with a vascular clamp on the distal portion of the artery and the patient was heparinized. I then dissected out the graft at the venous anastomosis and detached this, oversewing the vein with a running 5-0 Prolene suture. The graft then was removed in its entirety without difficulty as it was not incorporated and actually did not require  counterincision to do so.   The patient also had a previous graft off of the artery just above where this one had been, and it was in this area where there was a pocket of purulence that culture was sent from. The artery just above the area of removal of this graft also was opened and debrided. The back wall remained intact and I patched the artery closed with a CorMatrix extracellular patch with a 6-0 Prolene and a few 6-0 Prolene patch sutures were used for hemostasis. This restored flow distally and repaired the arterial defect. A medium Blake drain was run in the site of the old graft and tunneled out a counter incision in the forearm. The wound was irrigated. The superficial tissues were closed with 3-0 Vicryl. The skin was left open, although it was reasonably well approximated, and a VAC sponge was cut to fit the wound. Charlie Pitter was placed for good occlusive seal. This was connected to suction tubing and the VAC box.   The patient was then awakened from anesthesia and taken to the recovery room in stable condition having tolerated the procedure well.      ____________________________ Algernon Huxley, MD jsd:bjt D: 03/01/2012 11:28:00 ET T: 03/01/2012 11:54:31 ET JOB#: [id number]  cc: Darrold Span. Florene Glen, MD Algernon Huxley, MD, <Dictator>  Algernon Huxley MD ELECTRONICALLY SIGNED 03/05/2012 11:18

## 2014-09-16 NOTE — Consult Note (Signed)
PATIENT NAME:  [name], [name] MR#:  [id number] DATE OF BIRTH:  March 13, 1946  DATE OF CONSULTATION:  02/28/2012  REFERRING PHYSICIAN:  Phillips Climes, MD CONSULTING PHYSICIAN:  Britlyn Martine Lilian Kapur, MD  REASON FOR CONSULTATION: Evaluation and management of end-stage renal disease in a hemodialysis patient.   HISTORY OF PRESENT ILLNESS: The patient is a pleasant 69 year old African American female with past medical history of end-stage renal disease on hemodialysis Monday, Wednesday, and Friday, diabetes mellitus, hypertension, hypothyroidism, chronic obstructive pulmonary disease, anemia, chronic kidney disease, and secondary hyperparathyroidism who presented to [facility] for treatment of bacteremia. She originally presented to [facility] in [place], [place]. She presented at that time with fevers and chills as well as malaise. Temperature was found to be 103.5 at that point in time. The patient was found to have gram-negative bacteremia. Given the fact that she has had relatively recent surgery here for left upper extremity AV graft, she was sent here for further treatment. She currently is afebrile with a temperature of 98.4. There is a possibility of graft removal during this admission. The patient went to dialysis on Monday and tolerated this quite well. She uses a right internal jugular PermCath for this.   PAST MEDICAL HISTORY:  1. Endstage renal disease on hemodialysis Monday, Wednesday, and Friday followed by [facility] in [place], [place].  2. Anemia of chronic kidney disease.  3. Secondary hyperparathyroidism.  4. Hypertension.  5. Diabetes mellitus.  6. Hypothyroidism.  7. Prior multiple failed AV accesses.   PAST SURGICAL HISTORY:  1. Right IJ PermCath.  2. Left forearm AV graft on 02/02/2012.  3. Revision and superficialization of left brachial basilic AV fistula on 19/14/7829.   ALLERGIES: Contrast dye.  CURRENT INPATIENT  MEDICATIONS:  1. Tylox 1 to 2 tablets p.o. every four hours p.r.n. pain.  2. Doxazosin 8 mg p.o. at bedtime. 3. Heparin 5000 units subcutaneous every 12 hours. 4. Humalog 75/25, 10 units subcutaneous at dinner. 5. Humalog 75/25, 20 units subcutaneous before breakfast. 6. Sliding scale insulin.  7. Levothyroxine 0.075 mg p.o. every 6:00 a.m.  8. Lisinopril 20 mg daily. 9. Nephro-Vite 1 tablet p.o. daily.  10. Zofran 4 mg every four hours p.r.n. nausea and vomiting. 11. Zosyn 3.375 grams IV every 12 hours.   SOCIAL HISTORY: The patient lives in [place]. [name] separated. She has two adult children. 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 She smokes 1/2 pack of cigarettes per day. Denies alcohol use.   FAMILY HISTORY: Mother died secondary to diabetes mellitus. Father died secondary to myocardial infarction   REVIEW OF SYSTEMS: CONSTITUTIONAL: Reports fevers and chills. Denies weight loss. EYES: Denies diplopia or blurry vision. HEENT: Denies headaches or hearing loss. Denies epistaxis. CARDIOVASCULAR: Denies chest pain, palpitations, or PND. RESPIRATORY: Denies cough, shortness of breath, or hemoptysis. GI: Denies nausea and vomiting, has had some poor appetite. GU: Denies frequency, urgency, or dysuria. MUSCULOSKELETAL: Denies joint pain, swelling, or redness. INTEGUMENTARY: Denies skin rashes or lesions. NEUROLOGIC: Denies focal extremity numbness, weakness, or tingling. PSYCHIATRIC: Denies depression or bipolar disorder. ENDOCRINE: Denies polyuria or polydipsia. Has a history of diabetes mellitus. HEMATOLOGIC/LYMPHATIC: Denies easy bruisability, bleeding, or swollen lymph nodes. Has history of anemia of chronic kidney disease. ALLERGY/IMMUNOLOGIC: Denies seasonal allergies or history of immunodeficiency.   PHYSICAL EXAMINATION:   VITAL SIGNS: Temperature 98.4, pulse 93, respirations 18, blood pressure 169/71, and pulse oximetry 96% on room air.   GENERAL: Well developed, well  nourished  African American female who appears her stated age, currently in no acute distress.   HEENT: Normocephalic, atraumatic. Extraocular movements are intact. Pupils are equal, round, and reactive to light. No scleral icterus. Conjunctivae are pink. No epistaxis noted. Gross hearing intact. Oral mucosal are moist.   NECK: Supple and without JVD. There is a right internal jugular tunneled PermCath noted.   LUNGS: Clear to auscultation bilaterally with normal respiratory effort.   HEART: S1 and S2 regular rate and rhythm. No obvious murmurs or rubs appreciated.   ABDOMEN: Soft, nontender, and nondistended. Bowel sounds positive. No rebound or guarding. No gross organomegaly appreciated.   EXTREMITIES: No clubbing or cyanosis. Trace lower extremity edema noted.   NEUROLOGIC: The patient is alert and oriented to time, person, and place. Strength is five out of five in both upper and lower extremities.   SKIN: Warm and dry. No rashes noted.   PSYCHIATRIC: The patient has an appropriate affect and appears to have good insight into her current illness.   ACCESS: The patient has a left upper extremity AV graft that is in place. It is erythematous and tender to the touch.   LABORATORY DATA: Sodium glucose 194. Otherwise, laboratory studies are pending at present.   IMPRESSION: This is a 69 year old African American female with past medical history of end-stage renal disease on hemodialysis Monday, Wednesday, and Friday, anemia of chronic kidney disease, secondary hyperparathyroidism, hypertension, diabetes mellitus, multiple previous failed accesses, right internal jugular PermCath in placement, and recent left upper extremity AV graft placement who presented to [facility] for treatment of gram-negative bacteremia.  RECOMMENDATIONS:  1. End-stage renal disease. The patient has been on dialysis for the past 2-1/2 years. She dialyzes in [place], [place] and is  followed by [facility]. She underwent dialysis successfully on Monday. No acute indication for dialysis at this time. We will plan for dialysis tomorrow using the right internal jugular PermCath.  2. Anemia of chronic kidney disease. We will check CBC tomorrow to determine if we should resume Epogen. 3. Secondary hyperparathyroidism. The patient was taking TUMS at home. [name] not on any other phosphorus binders at home. We will check intact PTH and phosphorus with dialysis tomorrow.  4. Bacteremia. The patient was found to have gram-negative bacteremia at [facility]. The patient is on broad-spectrum therapy with Zosyn at this point in time.  5. Complication dialysis device. Her access is erythematous and swollen. [name] being considered for graft removal by Dr. Lucky Cowboy. The patient is on antibiotic therapy with Zosyn. We would also consider treatment with vancomycin as well; however, I will discuss this with the hospitalist.   I would like to thank Dr. Phillips Climes for this kind referral. Further plan as the patient progresses.  ____________________________ Tama High, MD mnl:slb D: 02/28/2012 13:52:38 ET T: 02/28/2012 14:47:24 ET JOB#: [id number]  cc: Tama High, MD, <Dictator> Mariah Milling Catilyn Boggus MD ELECTRONICALLY SIGNED 03/06/2012 23:30

## 2014-09-16 NOTE — Discharge Summary (Signed)
PATIENT NAME:  [name], [name] MR#:  [id number] DATE OF BIRTH:  12-02-1945  DATE OF ADMISSION:  02/28/2012 DATE OF DISCHARGE:  03/06/2012  DIAGNOSES:  1. Gram negative likely Pseudomonas bacteremia, source left upper extremity graft. 2. Left upper extremity graft infection with Pseudomonas status post excision. 3. End-stage renal disease. 4. Hypertension. 5. Hypothyroidism. 6. Diabetes. 7. Chronic anemia. 8. Thrombocytopenia.   DISPOSITION: The patient is being discharged home.   FOLLOW-UP: Follow-up with primary care physician, Dr. Lucky Cowboy, and Dr. Candiss Norse in 1 to 2 weeks after discharge.   DIET: Low sodium, low phosphorous, 1800 calorie ADA diet.   DISCHARGE MEDICATIONS:  1. Doxazosin 8 mg once a day. 2. Albuterol 2 puffs q.6 hours as needed.  3. Amlodipine 5 mg daily.  4. Aspirin 81 mg daily.  5. Lipitor 40 mg daily.  6. Vol-care 1 tablet once a day.  7. Omega-3 Fish Oil 1000 mg b.i.d.  8. Glipizide 10 mg b.i.d.  9. Codeine/Guaifenesin 10 mg/100 mg 5 mL four times a day as needed for cough.  10. Synthroid 75 mcg daily.  11. Niaspan ER 500 mg daily.  12. Insulin 25/75 20 units in the morning and 10 units with dinner.  13. Calcium carbonate 500 mg 2 tablets 3 times a day. 14. Tylenol/oxycodone 5/325 one tablet q.4 hours. 15. Cipro 500 mg b.i.d. for 10 days.   CONSULTATIONS:  1. Vascular Surgery consultation with Dr. Lucky Cowboy  2. Nephrology consultation with Dr. Candiss Norse and Dr. Holley Raring   LABORATORY, DIAGNOSTIC, AND RADIOLOGICAL DATA: Hepatitis B surface antigen negative. PTH elevated at 120. Wound culture sent from left upper extremity graft showed Pseudomonas. Blood cultures done on October 1st and 4th have shown no growth so far. White count normal. Hemoglobin 8.9 to 7.8. Platelets 164 to 125. The patient has end-stage renal disease and is on dialysis. LFTs normal. Electrolytes normal.   HOSPITAL COURSE: The patient is a 69 year old female with past medical history of diabetes,  hypertension, hypothyroidism, and end-stage renal disease who was sent as a direct admission from [facility] Emergency Room for bacteremia. The patient initially went there with a fever of 103.5 on 09/29 and was found to have gram-negative rods in her blood. Her left arm AV fistula was found to be infected. Since the graft was placed by Dr. Lucky Cowboy, she was requested to be transferred to [facility]. The patient was found to have left upper extremity graft infection. Wound culture from the graft showed Pseudomonas. The patient had repeat blood cultures drawn in [facility] on October 1st and 4th which have shown no growth so far. She likely had Pseudomonas bacteremia with the source being her infected left upper extremity graft. She was treated with vancomycin and Zosyn while in the hospital. She underwent graft resection by Dr. Lucky Cowboy and had a temporary catheter placed for dialysis. On 03/05/2012 she underwent PermCath placement by Dr. Lucky Cowboy. She had some minor oozing from it which has currently resolved. She was dialyzed under the supervision of the nephrologist while in the hospital. Her medical problems including hypertension, hypothyroidism, and diabetes remained stable. [name] being discharged home on oral Cipro for an additional 10 days since Pseudomonas was sensitive to it. Her outpatient dialysis will be resumed. She will follow-up with Dr. Candiss Norse, Dr. Lucky Cowboy, and her PCP after discharge from the hospital.   TIME SPENT: 45 minutes.   ____________________________ Cherre Huger, MD sp:drc D: 03/06/2012 15:15:35 ET T: 03/07/2012 10:06:06 ET JOB#: [id number]  cc: Cherre Huger,  MD, <Dictator>, Algernon Huxley, MD, Murlean Iba, MD Cherre Huger MD ELECTRONICALLY SIGNED 03/07/2012 14:19

## 2014-09-16 NOTE — Op Note (Signed)
PATIENT NAME:  [name], [name] MR#:  [id number] DATE OF BIRTH:  18-Apr-1946  DATE OF PROCEDURE:  01/18/2012  PREOPERATIVE DIAGNOSES:  1. End-stage renal disease.  2. Nonfunctional left brachial basilic AV fistula.  3. Multiple failed previous access attempts.  4. Hypothyroidism.  5. Hyperlipidemia.  6. Diabetes.   POSTOPERATIVE DIAGNOSES: 1. End-stage renal disease.  2. Nonfunctional left brachial basilic AV fistula.  3. Multiple failed previous access attempts.  4. Hypothyroidism.  5. Hyperlipidemia.  6. Diabetes.   PROCEDURE: Revision/superficialization of left brachial basilic AV fistula.   SURGEON: Algernon Huxley, MD   ANESTHESIA: General.   ESTIMATED BLOOD LOSS: Approximately 200 mL.   INDICATION FOR PROCEDURE: The patient is a 69 year old African American female well known to me for her dialysis access needs. She has had multiple failed access attempts. She has had a left brachial basilic AV fistula which has already had a second stage operation, however, this is still not usable for cannulation as it is felt to be too deep. She has a reasonable amount of scar tissue and a somewhat large arm in the upper arm. We are asked to surgically revise this to make it usable. Risks and benefits were discussed. Informed consent was obtained.   DESCRIPTION OF PROCEDURE: The patient was brought to the operative suite and after an adequate level of general anesthesia was obtained the left upper extremity was sterilely prepped and draped and a sterile surgical field was created. We made an incision overlying the fistula and dissected down to the fistula with electrocautery. Several small hyperemic branches were controlled. Some were ligated and some were controlled with cautery as we worked our way down into the superficial tissue. The fistula was quite inflamed after previous intervention and stent placement. It was dissected out and several centimeters of the fistula were brought more superficial  than previous. It had an early emptying into the deep venous system in the upper arm so there was not an extensive length of vein that was usable but what was usable was brought to a more superficial location and this was treated both proximally and distally. Two small areas of the fistula had to be controlled with 5-0 or 6-0 Prolene sutures during mobilization due to the extensive scar tissue in the area. I also took a significant amount of time freeing the scar tissue and adventitial tissue on the fistula to help with dilation and maturation. Surgicel and Evicyl topical hemostatic agents were placed and hemostasis was complete. I closed a bit of deep tissue with interrupted 3-0 Vicryl suture underneath the fistula to ensure that it remains in a superficial location and the skin was closed with 4-0 Monocryl directly over the fistula. Dermabond was placed as a dressing. The patient tolerated the procedure well and was taken to the recovery room in stable condition.   ____________________________ Algernon Huxley, MD jsd:drc D: 01/18/2012 12:43:15 ET T: 01/18/2012 14:03:56 ET JOB#: [id number]  cc: Algernon Huxley, MD, <Dictator> Algernon Huxley MD ELECTRONICALLY SIGNED 01/19/2012 11:40

## 2014-09-16 NOTE — H&P (Signed)
PATIENT NAME:  [name], [name] MR#:  [id number] DATE OF BIRTH:  11-Nov-1945  DATE OF ADMISSION:  02/28/2012  PRIMARY CARE PHYSICIAN: Dr. Perlie Gold REFERRING PHYSICIAN: [name]   CHIEF COMPLAINT: Bacteremia.   HISTORY OF PRESENT ILLNESS: This is a 69 year old female with significant past medical history of end-stage renal disease on hemodialysis for last 2-1/2 years, hypertension, diabetes mellitus, hypothyroidism, patient had been on hemodialysis for last 2-1/2 years, had multiple failed accesses, been getting hemodialysis in the right chest Perm-A-Cath since January of this year, Patient had multiple failed previous dialysis attempts. Patient had left forearm AV graft done by Dr. Lucky Cowboy on 09/05. Patient presented to [facility] ED on 02/26/2012 complaining of fever and chills and not feeling well. Patient was found to have fever 103.5. Patient had septic work-up done and was sent home. Two sets of blood cultures came back growing gram-negative rods so patient was called back to ED for her bacteremia, and given the fact the patient had recent AV graft done by Dr. Lucky Cowboy they requested transfer to [facility] for further assessment and management of her bacteremia. Patient denies any fever or chills today, but complains off generalized weakness, fatigue and not feeling well. By reviewing patient's records at [facility] patient had urinalysis done yesterday which came back positive. Patient making mild amount of urine on a daily basis. As well by reviewing patient's old labs, patient appears to have another episode of bacteremia on 02/11/2012 where her blood culture did grow Pseudomonas aeruginosa which was pansensitive Patient was given IV vancomycin and Zosyn at [facility] prior to her being sent to [facility]. As well patient had some erythema and swelling at dialysis site but she denies any pain.   PAST MEDICAL HISTORY:  1. End-stage renal disease on hemodialysis Monday, Wednesday,  Friday. Patient had hemodialysis on Monday without any complications.  2. Diabetes mellitus.  3. Hypertension.  4. Hypothyroidism.  5. History of chronic obstructive pulmonary disease.    HOME MEDICATIONS:  1. Cardura 8 mg at bedtime.  2. Glipizide 10 mg oral b.i.d.  3. Humalog 75/25, 35 units in the morning, 20 units before supper.  4. Lisinopril 20 mg oral daily.  5. Rena-Vite 1 tablet oral daily.  6. Niacin 500 mg at bedtime.   SOCIAL HISTORY: Patient smokes about half pack per day. No history of alcohol or substance abuse.   FAMILY HISTORY: Significant for diabetes mellitus.   REVIEW OF SYSTEMS: CONSTITUTIONAL: Patient has complaints of fever, chills, fatigue and weakness. EYES: Denies blurry vision, double vision or pain. ENT: Denies tinnitus, ear pain, hearing loss. RESPIRATORY: Denies cough, wheezing, hemoptysis, dyspnea. CARDIOVASCULAR: Denies chest pain, edema, arrhythmia, palpitations, syncope. GASTROINTESTINAL: Denies nausea, vomiting, diarrhea, abdominal pain, hematemesis. GENITOURINARY: Patient making minimal amount of urine. Denies any dysuria, hematuria, renal colic. ENDO: Denies polyuria, polydipsia, heat or cold intolerance. Has history of hypothyroidism. HEMATOLOGY: Denies easy bruising, bleeding diathesis or blood clots. INTEGUMENTARY: Denies any acne or rash. MUSCULOSKELETAL: Denies any swelling, gout, arthritis, cramps. NEUROLOGIC: Denies numbness, dysarthria, epilepsy, tremors, vertigo. PSYCH: Denies any schizophrenia, anxiety, insomnia, nervousness, substance or alcohol abuse.   PHYSICAL EXAMINATION:  VITAL SIGNS: Temperature 98.1, pulse 78, respiratory rate 18, blood pressure 158/68, saturating 93% on room air.   GENERAL: Elderly female looks comfortable in bed in no apparent distress.   HEENT: Head atraumatic, normocephalic. Pupils equal, reactive to light. Pink conjunctivae. Anicteric sclerae. Moist oral mucosa.   NECK: Supple. No thyromegaly. No JVD.   CHEST:  Good air entry bilaterally. No wheezing, rales, rhonchi. Has right chest Perm-A- Catheter.   CARDIOVASCULAR: S1, S2 heard. No rubs, murmur, gallops.   ABDOMEN: Soft, nontender, nondistended. Bowel sounds present.   EXTREMITIES: No edema. No clubbing. No cyanosis.   SKIN: Patient has multiple previous AV graft in bilateral lower extremity and there is left forearm AV graft with bruits heard and pulsation could be felt, nontender with mild erythema.   PSYCHIATRIC: Appropriate affect. Awake, alert x3. Intact judgment and insight.   NEUROLOGIC: Cranial nerves grossly intact. Motor 5/5. Sensation symmetrical and intact.   LABORATORY, DIAGNOSTIC AND RADIOLOGICAL DATA: These labs are from [facility] ED: Sodium 137, potassium 3.6, chloride 95, CO2 30, BUN 32, creatinine 5.89, white blood cells 4.5, hemoglobin 9.1, hematocrit 29.5, platelets 146. Urinalysis done 02/26/2012 showing nitrite negative, leukocytes small with 11 to 20 white blood cells.   Patient had two sets of blood cultures 02/27/2012 positive for gram-negative rods.   Patient had positive blood cultures for Pseudomonas aeruginosa 02/11/2012, pansensitive but she had no urinalysis or urine culture done then.   ASSESSMENT AND PLAN:  1. Bacteremia. Patient is growing gram-negative rods in two sets of blood culture from 09/30. This is most likely Pseudomonas as her last blood culture on 09/14 was growing that. There is no clear indication if patient was treated for that previous blood cultures. This is most likely related to urinary tract infection. Will follow on the urine culture which was done at [facility] ED. Her bacteremia most likely related to urinary tract infection, will start patient on Zosyn as her previous blood cultures 09/14 was sensitive for that. As stated source is most likely due to urinary tract infection, unlikely her source is her hemodialysis access given the fact that is uncommon for line sepsis to grow gram-negative  rods but the fact now patient has been having fever and bacteremia while she is having hemodialysis access is unclear if needs to be changed or not so will consult Dr. Lucky Cowboy for further recommendations and management.  2. End-stage renal disease. Will continue patient on hemodialysis Monday, Wednesday, Friday. Patient received her dialysis on Monday without complication. Will consult nephrology service.  3. Hypertension. Will continue home medication.  4. Hypothyroidism. Will continue with Synthroid.  5. Deep vein thrombosis prophylaxis. Continue with sub-Q heparin.  6. CODE STATUS: FULL CODE.   TOTAL TIME SPENT ON ADMISSION AND PATIENT CARE: 55 minutes.   ____________________________ Albertine Patricia, MD dse:cms D: 02/28/2012 07:32:46 ET T: 02/28/2012 08:58:15 ET JOB#: [id number]  cc: Albertine Patricia, MD, <Dictator> Bernhardt Riemenschneider Graciela Husbands MD ELECTRONICALLY SIGNED 02/29/2012 1:44

## 2014-09-16 NOTE — Op Note (Signed)
PATIENT NAME:  [name], [name] MR#:  [id number] DATE OF BIRTH:  [birth year]  DATE OF PROCEDURE:  03/05/2012  PREOPERATIVE DIAGNOSES:  1. End-stage renal disease.  2. Recent removal of infected left arm AV graft and PermCath.  3. Bacteremia with gram-negative rods.  4. Hypertension.   POSTOPERATIVE DIAGNOSES:  1. End-stage renal disease.  2. Recent removal of infected left arm AV graft and PermCath.  3. Bacteremia with gram-negative rods.  4. Hypertension.   PROCEDURES:  1. Ultrasound guidance for vascular access, left jugular vein.  2. Fluoroscopic guidance for placement of catheter.  3. Placement of catheter into superior vena cava.  4. Superior venacavogram.  5. Placement of a 27 cm palindrome dialysis catheter.   SURGEON: Algernon Huxley, MD   ANESTHESIA: Local.   ESTIMATED BLOOD LOSS: Approximately 100 mL.  FLUOROSCOPY TIME: 14 minutes.   CONTRAST USED: 5 mL.  INDICATION FOR PROCEDURE: This is a 69 year old African American female with end-stage renal disease. She had a recent graft infection and had her left arm AV graft and PermCath removed. Her blood cultures have cleared and she is now ready for a PermCath. Risks and benefits were discussed. Informed consent was obtained.   DESCRIPTION OF PROCEDURE: The patient is brought to the Vascular Interventional Radiology Suite. Left neck and chest were sterilely prepped and draped and a sterile surgical field was created. The left jugular vein was visualized initially and accessed without difficulty with a Seldinger needle. A J-wire was placed. Initially the wire would not track into the superior vena cava and a Kumpe catheter was placed to evaluate with superior venacavogram. This showed a patent vena cava but it was highly tortuous. We were able to park a Kumpe catheter down through the atrium into the IVC and put a wire there. Initially I selected a 23 cm tip-to-cuff tunneled hemodialysis catheter. This was placed but had a significant  kink and was not going to be functional. I placed a stiff wire and rewired for a 23 Dura Max catheter. Again, this was placed over and with the kink in the catheter the catheter tips were either going to be too far up into the superior vena cava once the kink was removed where there was going to be a kink and the catheter would not work. At this point, there appeared to be a very steep angle in the neck and I elected to access the jugular a little further down just above the collar bone to improve the angle. This was done without difficulty with a micropuncture needle. Micropuncture wire and sheath were placed. I then placed a Kumpe catheter and exchanged for an Amplatz Super Stiff wire and sequentially dilated the tract, placed a peel-away sheath, brought it out the same counterincision I had used previously but this time it was a much gentler curve. Also, a longer catheter was used and a 28 cm tip-to-cuff palindrome catheter was then placed over the wire through the peel-away sheath and the peel-away sheath and wire were removed. The catheter was actually just through the atrium but withdrew blood well and flushed easily with heparinized saline and there was not a kink. The cuff was placed just prior to the catheter exit site. This area was secured with a 4-0 Monocryl suture and a 4-0 Monocryl was used to close both neck incisions. Two Prolene sutures were used to secure the catheter to the chest wall and a concentrated heparin solution was then placed. A sterile dressing was placed. The patient tolerated  the procedure well and was taken to the recovery room in stable condition.   ____________________________ Algernon Huxley, MD jsd:drc D: 03/05/2012 14:59:18 ET T: 03/05/2012 15:14:39 ET JOB#: [id number]  cc: Algernon Huxley, MD, <Dictator> Algernon Huxley MD ELECTRONICALLY SIGNED 03/08/2012 10:15

## 2014-09-18 DIAGNOSIS — E785 Hyperlipidemia, unspecified: Secondary | ICD-10-CM | POA: Diagnosis not present

## 2014-09-18 DIAGNOSIS — N186 End stage renal disease: Secondary | ICD-10-CM | POA: Diagnosis not present

## 2014-09-18 DIAGNOSIS — N2581 Secondary hyperparathyroidism of renal origin: Secondary | ICD-10-CM | POA: Diagnosis not present

## 2014-09-18 DIAGNOSIS — E1129 Type 2 diabetes mellitus with other diabetic kidney complication: Secondary | ICD-10-CM | POA: Diagnosis not present

## 2014-09-18 DIAGNOSIS — D631 Anemia in chronic kidney disease: Secondary | ICD-10-CM | POA: Diagnosis not present

## 2014-09-18 DIAGNOSIS — D509 Iron deficiency anemia, unspecified: Secondary | ICD-10-CM | POA: Diagnosis not present

## 2014-09-20 DIAGNOSIS — N186 End stage renal disease: Secondary | ICD-10-CM | POA: Diagnosis not present

## 2014-09-20 DIAGNOSIS — D509 Iron deficiency anemia, unspecified: Secondary | ICD-10-CM | POA: Diagnosis not present

## 2014-09-20 DIAGNOSIS — N2581 Secondary hyperparathyroidism of renal origin: Secondary | ICD-10-CM | POA: Diagnosis not present

## 2014-09-20 DIAGNOSIS — D631 Anemia in chronic kidney disease: Secondary | ICD-10-CM | POA: Diagnosis not present

## 2014-09-21 NOTE — Op Note (Signed)
PATIENT NAME:  CEONNA, FRAZZINI MR#:  [id number] DATE OF BIRTH:  06-Aug-1945  DATE OF PROCEDURE:  10/13/2011  PREOPERATIVE DIAGNOSES:  1. End-stage renal disease with nonusable left brachiobasilic AV fistula.   2. Multiple failed previous dialysis accesses attempts.  3. Hyperlipidemia.  4. Hypertension.   POSTOPERATIVE DIAGNOSES: 1. End stage renal disease with nonusable left brachiobasilic AV fistula.  2. Multiple failed previous dialysis accesses attempts.  3. Hyperlipidemia.  4. Hypertension.   PROCEDURE: Left brachiobasilic AV fistula revision/transposition.   SURGEON: Algernon Huxley, MD   ANESTHESIA: General.   ESTIMATED BLOOD LOSS: Approximately 25 mL.   INDICATION FOR PROCEDURE: The patient is a 69 year old African American female with end-stage renal disease. She has had multiple previous dialysis access attempts. She had a brachiobasilic AV fistula created approximately one month ago. This is far too deep for use and will need to be superficialized and transposed. [name] brought back for second stage operation and performed this today. Risks and benefits were discussed. Informed consent was obtained.   DESCRIPTION OF PROCEDURE: The patient was brought to the operative suite. Left upper extremity was sterilely prepped and draped and a sterile surgical field was created. A long incision overlying the palpable thrill in the basilic vein was created. We dissected down to the basilic vein in the brachial sheath area. Care was taken to protect the nerve. This was dissected out over the length until it joined the brachial vein in the upper arm. Several branches were ligated and divided between silk ties. It was then marked for orientation. A curved clamp was used to tunnel a more lateral course and the patient was heparinized. A vascular clamp was placed just beyond the previously placed anastomosis and the vein was transected. It was then tunneled from the upper arm back and transposed and  placed in location to create a nice tension-free anastomosis. The anastomosis was created with [place] parachuted 6-0 Prolene sutures in the usual fashion closed in quadrants. A single 6-0 Prolene patch suture was used for hemostasis. The vessel was flushed and de-aired prior to release of control. On release there was a nice soft thrill within the AV fistula with good flow throughout. The wound was then irrigated and closed with several interrupted 3-0 Vicryls, a running 3-0 Vicryl, and a 4-0 Monocryl. Dermabond was placed as a dressing. The patient tolerated the procedure well and was taken to the recovery room in stable condition.   ____________________________ Algernon Huxley, MD jsd:drc D: 10/13/2011 16:25:12 ET T: 10/14/2011 08:07:44 ET JOB#: [id number]  cc: Algernon Huxley, MD, <Dictator> Algernon Huxley MD ELECTRONICALLY SIGNED 10/18/2011 12:11

## 2014-09-21 NOTE — Op Note (Signed)
PATIENT NAME:  [name], [name] MR#:  [id number] DATE OF BIRTH:  1945/07/31  DATE OF PROCEDURE:  08/25/2011  PREOPERATIVE DIAGNOSES:  1. End-stage renal disease.  2. Diabetes.  3. Hyperlipidemia.   POSTOPERATIVE DIAGNOSES:    1. End-stage renal disease.  2. Diabetes.  3. Hyperlipidemia.   PROCEDURE:  Left brachial basilic AV fistula.   SURGEON: Algernon Huxley, M.D.   ANESTHESIA: General.   ESTIMATED BLOOD LOSS: 25 mL.   INDICATION FOR PROCEDURE: This is a 69 year old African American female with end-stage renal disease. She has had multiple previous access attempts. I have performed and accessed the antecubital fossa with what turned out to be a branch and not the true basilic vein. This did not mature. [name] brought back for another attempt at a brachiobasilic AV fistula as this is likely her last option for autologous fistula placement. Risks and benefits were discussed. Informed consent was obtained.   DESCRIPTION OF PROCEDURE: The patient was brought to the operative suite. After an adequate level of general anesthesia was obtained, her left upper extremity was sterilely prepped and draped and a sterile surgical field was created. Ultrasound was used to help guide [place] to the extremely medial basilic vein. This was further medial than typical and she had a very large upper arm and so a very generous incision was created just above the antecubital fossa in a transverse fashion. We dissected this out, ligated a couple of branches between ties and gained a good length distally to be able to swing up to the brachial artery which was actually quite separate. The brachial artery was then dissected out quite tediously due to the multiple previous surgeries she had had at the antecubital fossa. The patient had a known high bifurcation. This may have been the radial or ulnar artery. It was difficult to tell intraoperatively, but the vessel was encircled with vessel loops proximally and distally and  prepared for fistula creation. 3,000 units of intravenous heparin were given for systemic anticoagulation. The vein was ligated distally, then cut and beveled to an appropriate length to match an anterior wall arteriotomy which was created with an 11 blade and extended with Potts scissors. An anastomosis was created with a running 6-0 Prolene suture in the typical fashion. The vessel was flushed and de-aired prior to release of control. On release, there was some vasospasm that was treated with topical papaverine. This resulted in improved thrill within the AV fistula. I brought the continuous wave Doppler on the field and heard good fistula flow present and good arterial flow distal to the anastomosis. At this point I elected to terminate the procedure Surgicel was placed. The wound was closed with a running 3-0 Vicryl and 4-0 Monocryl and Dermabond was placed as a dressing. The patient tolerated the procedure well and was taken to the recovery room in stable condition.   ____________________________ Algernon Huxley, MD jsd:ap D: 08/25/2011 16:18:22 ET T: 08/25/2011 16:38:25 ET JOB#: [id number]  cc: Algernon Huxley, MD, <Dictator> Algernon Huxley MD ELECTRONICALLY SIGNED 08/31/2011 15:04

## 2014-09-21 NOTE — Op Note (Signed)
PATIENT NAME:  [name], [name] MR#:  [id number] DATE OF BIRTH:  June 16, 1945  DATE OF PROCEDURE:  12/08/2011  PREOPERATIVE DIAGNOSES:  1. End-stage renal disease.  2. 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 3. Hypertension.   POSTOPERATIVE DIAGNOSES:  1. End-stage renal disease.  2. 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 3. Hypertension.   PROCEDURES PERFORMED: 1. Ultrasound guidance for vascular access, left arm AV fistula.  2. Left upper extremity fistulogram.  3. Percutaneous transluminal angioplasty with 7 mm diameter angioplasty balloon to a stenosis at the swing point of the basilic vein just prior to its entry into the deep venous system.  4. Covered stent placement for residual stenosis and extravasation after angioplasty.   SURGEON: Algernon Huxley, M.D.   ANESTHESIA: Local with moderate conscious sedation.   ESTIMATED BLOOD LOSS: 25 mL.   INDICATION FOR PROCEDURE: This is a 69 year old African American female with end-stage renal disease. Her fistula has not yet matured for dialysis use. She has stenosis on noninvasive studies. [name] brought in for angiography for further evaluation and possible treatment. Risks and benefits were discussed and informed consent was obtained.   DESCRIPTION OF PROCEDURE: The patient is brought to the vascular interventional radiology suite. The left upper extremity was sterilely prepped and draped and a sterile surgical field was created. The fistula was accessed just beyond the anastomosis in an antegrade fashion with a micropuncture needle and under direct ultrasound guidance a permanent image was recorded. Micropuncture wire and sheath were placed and we up-sized to a 6 [place] sheath. Imaging showed a very high-grade stenosis, in the basilic vein, and the swing point as it transitioned into the deep venous system. I crossed this lesion without difficulty with a Magic torque wire and gave 2500 units of  intravenous heparin for systemic anticoagulation. A 7 mm diameter angioplasty balloon was inflated in this location. A waste was taken which resolved. Completion angiogram following this showed residual stenosis with extravasation after angioplasty and this was treated with a 7 mm diameter Viabahn stent to the area. Following stent placement, a 7 mm balloon was inflated within the stent and completion fistulogram now showed this area to be widely patent without significant residual stenosis and no current extravasation. At this point I elected to terminate the procedure. A 4-0 Monocryl pursestring suture was placed around the exit site, pressure was held, and a sterile dressing was placed. The patient tolerated the procedure well and was taken to the recovery room in stable condition. ____________________________ Algernon Huxley, MD jsd:slb D: 12/08/2011 09:32:57 ET T: 12/08/2011 10:05:20 ET JOB#: [id number]  cc: Algernon Huxley, MD, <Dictator> Algernon Huxley MD ELECTRONICALLY SIGNED 12/08/2011 13:48

## 2014-09-21 NOTE — Op Note (Signed)
PATIENT NAME:  [name], [name] MR#:  [id number] DATE OF BIRTH:  1945/06/18  DATE OF PROCEDURE:  08/04/2011  PREOPERATIVE DIAGNOSIS:  1. End-stage renal disease with nonfunctional left arm AV fistula.  2. Multiple failed previous dialysis accesses.  3. Hypertension.  4. Diabetes.  5. Chronic obstructive pulmonary disease.   POSTOPERATIVE DIAGNOSES:  1. End-stage renal disease with nonfunctional left arm AV fistula.  2. Multiple failed previous dialysis accesses.  3. Hypertension.  4. Diabetes.  5. Chronic obstructive pulmonary disease.  6. Peripheral vascular disease and iliac stenosis.   PROCEDURES PERFORMED:  1. Ultrasound guidance for vascular access, right femoral artery.  2. Catheter placement into left axillary artery from right femoral approach.  3. Abdominal aortogram, thoracic aortogram, and left upper extremity arteriogram.  4. Percutaneous transluminal angioplasty of right external iliac artery stenosis with 6 mm diameter angioplasty balloon.  5. Ultrasound guidance for vascular access, left basilic vein.  6. Catheter placement into left innominate vein from left basilic vein approach.  7. Left upper extremity venogram and central venogram.   SURGEON: Algernon Huxley, M.D.   ANESTHESIA: Local with moderate conscious sedation.   ESTIMATED BLOOD LOSS: 25 mL.   INDICATION FOR PROCEDURE: This is a 69 year old African American female who has end-stage renal disease. She has had previous failed dialysis accesses and had attempted a fistula in her earlier this year, but this has failed. [name] brought in for an evaluation with both an arteriogram and a venogram to see what our best options for access are and to see if there is any option for salvage of this current AV fistula.   DESCRIPTION OF PROCEDURE: The patient was brought to the vascular interventional radiology suite. Groins were shaved and prepped and a sterile surgical field was created. The right femoral artery was  visualized with ultrasound due to a poorly palpable right femoral pulse. This was accessed under ultrasound guidance without difficulty with a Seldinger needle. I had to pass a micropuncture wire and micropuncture sheath and imaging performed through the sheath showed a significantly diseased external iliac artery with multiple areas with greater than 70% stenosis. I was able to get a glide wire to cross this area and put a 5 French sheath in and balloon angioplasty was performed with a 6 mm diameter angioplasty balloon within the iliac vessels to treat her peripheral vascular disease and also allow [place] to gain access proximally. Following this there was improvement in the flow with what appeared to be non-hemodynamically significant residual stenosis. I then advanced a headhunter catheter over the stiff angled Glidewire and put a pigtail catheter into the ascending aorta after exchange and thoracic aortogram was performed. This demonstrated a bovine configuration of the great vessels without flow-limiting stenosis proximal into the great vessels.  Head hunter catheter was then used to cannulate the left subclavian artery without difficulty and this was advanced over a stiff angled Glidewire into the axillary artery. There was no stenosis in the subclavian or axillary artery. The brachial artery had a very high bifurcation just below the shoulder into the radial and ulnar artery. The previously placed fistula was not visualized, although there did appear to be a stump off of what was the radial artery, although it may have been the fistula which is now occluded. The radial artery was the dominant flow to the hand with a small ulnar artery present and an inner osseous artery. The radial artery did not have significant stenosis, except for a small area of stenosis  at the level of the wrist. At this point, I elected to terminate the arteriogram portion of the procedure. A StarClose closure device was deployed in the  right femoral artery with excellent hemostatic result. After prepping and draping the left upper extremity, I used the ultrasound to visualize the venous system. The basilic vein, which was initially thought to have been used, was actually far more medial and inferior and what we had used was a branch previously. With ultrasound guidance, this was accessed just below the antecubital fossa with a micropuncture needle and permanent image was recorded. Micropuncture wire and sheath were placed. Imaging showed a patent basilic vein, although this was in the 2.5 to 3 mm diameter size that drained into the axillary vein just below the shoulder. Initially we were not able to well visualize the central venous circulation and so I placed a Kumpe catheter over a stiff angled Glidewire, parked this in the left innominate vein, and then visualized the central venous circulation. This showed what appeared to be no significant stenosis in the innominate vein and superior vena cava with good flow around the catheter into the right atrium. At this point, I elected to terminate the procedure. The diagnostic catheter removed, pressure was held over the venous access site, and a sterile dressing was placed. The patient tolerated the procedure well and was taken to the recovery room in stable condition.  ____________________________ Algernon Huxley, MD jsd:slb D: 08/04/2011 12:15:45 ET T: 08/04/2011 12:43:54 ET JOB#: [id number]  cc: Algernon Huxley, MD, <Dictator> Algernon Huxley MD ELECTRONICALLY SIGNED 08/12/2011 16:24

## 2014-09-21 NOTE — Op Note (Signed)
PATIENT NAME:  [name], [name] MR#:  [id number] DATE OF BIRTH:  1946-01-07  DATE OF PROCEDURE:  06/16/2011  PREOPERATIVE DIAGNOSES:  1. Left arm AV fistula which is not maturing.  2. End-stage renal disease.  3. Hypertension.   POSTOPERATIVE DIAGNOSES:    1. Left arm AV fistula which is not maturing.  2. End-stage renal disease.  3. Hypertension.   PROCEDURE:  Attempt at access to left arm AV fistula unsuccessful with procedure aborted.   SURGEON: Algernon Huxley, M.D.   ANESTHESIA: Local with sedation.   ESTIMATED BLOOD LOSS: Minimal:  FLUORO: None.  CONTRAST USED: None.   INDICATION FOR PROCEDURE: This is an individual who we created an AV fistula on several weeks ago. She had aberrant anatomy with a high brachial bifurcation. The superficial vein this was anastomosed to drained into the deep venous system and not the basilic vein as was desired and she is brought in today for evaluation to see if this is salvageable.   DESCRIPTION OF PROCEDURE: The patient was brought to the vascular interventional radiology suite. The left upper extremity was sterilely prepped and draped and a sterile surgical field was created. On ultrasound this appeared to be either the radial or ulnar artery and there was a very short segment of superficial vein which drained directly into the associated deep venous system with the artery. This was directly underlying the artery all the way up the arm, particularly given the girth of her upper arm, access was not easily obtainable. At this point, I felt this fistula would be unlikely to be a usable fistula at any point and rather than to risk any   potential nerve or arterial injury, I elected to terminate the procedure. Sterile dressing was placed.   ____________________________ Algernon Huxley, MD jsd:ap D: 06/16/2011 11:50:01 ET T: 06/16/2011 12:28:48 ET JOB#: [id number]  cc: Algernon Huxley, MD, <Dictator> Algernon Huxley MD ELECTRONICALLY SIGNED 07/13/2011 11:12

## 2014-09-22 DIAGNOSIS — I871 Compression of vein: Secondary | ICD-10-CM | POA: Diagnosis not present

## 2014-09-22 DIAGNOSIS — N186 End stage renal disease: Secondary | ICD-10-CM | POA: Diagnosis not present

## 2014-09-22 DIAGNOSIS — T82858D Stenosis of vascular prosthetic devices, implants and grafts, subsequent encounter: Secondary | ICD-10-CM | POA: Diagnosis not present

## 2014-09-22 DIAGNOSIS — Z992 Dependence on renal dialysis: Secondary | ICD-10-CM | POA: Diagnosis not present

## 2014-09-23 DIAGNOSIS — N186 End stage renal disease: Secondary | ICD-10-CM | POA: Diagnosis not present

## 2014-09-23 DIAGNOSIS — N2581 Secondary hyperparathyroidism of renal origin: Secondary | ICD-10-CM | POA: Diagnosis not present

## 2014-09-23 DIAGNOSIS — D509 Iron deficiency anemia, unspecified: Secondary | ICD-10-CM | POA: Diagnosis not present

## 2014-09-23 DIAGNOSIS — D631 Anemia in chronic kidney disease: Secondary | ICD-10-CM | POA: Diagnosis not present

## 2014-09-25 DIAGNOSIS — N186 End stage renal disease: Secondary | ICD-10-CM | POA: Diagnosis not present

## 2014-09-25 DIAGNOSIS — D509 Iron deficiency anemia, unspecified: Secondary | ICD-10-CM | POA: Diagnosis not present

## 2014-09-25 DIAGNOSIS — N2581 Secondary hyperparathyroidism of renal origin: Secondary | ICD-10-CM | POA: Diagnosis not present

## 2014-09-25 DIAGNOSIS — D631 Anemia in chronic kidney disease: Secondary | ICD-10-CM | POA: Diagnosis not present

## 2014-09-27 DIAGNOSIS — Z992 Dependence on renal dialysis: Secondary | ICD-10-CM | POA: Diagnosis not present

## 2014-09-27 DIAGNOSIS — D509 Iron deficiency anemia, unspecified: Secondary | ICD-10-CM | POA: Diagnosis not present

## 2014-09-27 DIAGNOSIS — N186 End stage renal disease: Secondary | ICD-10-CM | POA: Diagnosis not present

## 2014-09-27 DIAGNOSIS — E1129 Type 2 diabetes mellitus with other diabetic kidney complication: Secondary | ICD-10-CM | POA: Diagnosis not present

## 2014-09-27 DIAGNOSIS — N2581 Secondary hyperparathyroidism of renal origin: Secondary | ICD-10-CM | POA: Diagnosis not present

## 2014-09-27 DIAGNOSIS — D631 Anemia in chronic kidney disease: Secondary | ICD-10-CM | POA: Diagnosis not present

## 2014-09-30 DIAGNOSIS — D509 Iron deficiency anemia, unspecified: Secondary | ICD-10-CM | POA: Diagnosis not present

## 2014-09-30 DIAGNOSIS — D631 Anemia in chronic kidney disease: Secondary | ICD-10-CM | POA: Diagnosis not present

## 2014-09-30 DIAGNOSIS — E1129 Type 2 diabetes mellitus with other diabetic kidney complication: Secondary | ICD-10-CM | POA: Diagnosis not present

## 2014-09-30 DIAGNOSIS — N186 End stage renal disease: Secondary | ICD-10-CM | POA: Diagnosis not present

## 2014-09-30 DIAGNOSIS — N2581 Secondary hyperparathyroidism of renal origin: Secondary | ICD-10-CM | POA: Diagnosis not present

## 2014-10-02 DIAGNOSIS — N186 End stage renal disease: Secondary | ICD-10-CM | POA: Diagnosis not present

## 2014-10-02 DIAGNOSIS — D509 Iron deficiency anemia, unspecified: Secondary | ICD-10-CM | POA: Diagnosis not present

## 2014-10-02 DIAGNOSIS — E1129 Type 2 diabetes mellitus with other diabetic kidney complication: Secondary | ICD-10-CM | POA: Diagnosis not present

## 2014-10-02 DIAGNOSIS — N2581 Secondary hyperparathyroidism of renal origin: Secondary | ICD-10-CM | POA: Diagnosis not present

## 2014-10-02 DIAGNOSIS — D631 Anemia in chronic kidney disease: Secondary | ICD-10-CM | POA: Diagnosis not present

## 2014-10-04 DIAGNOSIS — D631 Anemia in chronic kidney disease: Secondary | ICD-10-CM | POA: Diagnosis not present

## 2014-10-04 DIAGNOSIS — N2581 Secondary hyperparathyroidism of renal origin: Secondary | ICD-10-CM | POA: Diagnosis not present

## 2014-10-04 DIAGNOSIS — E1129 Type 2 diabetes mellitus with other diabetic kidney complication: Secondary | ICD-10-CM | POA: Diagnosis not present

## 2014-10-04 DIAGNOSIS — N186 End stage renal disease: Secondary | ICD-10-CM | POA: Diagnosis not present

## 2014-10-04 DIAGNOSIS — D509 Iron deficiency anemia, unspecified: Secondary | ICD-10-CM | POA: Diagnosis not present

## 2014-10-06 DIAGNOSIS — B351 Tinea unguium: Secondary | ICD-10-CM | POA: Diagnosis not present

## 2014-10-06 DIAGNOSIS — N186 End stage renal disease: Secondary | ICD-10-CM | POA: Diagnosis not present

## 2014-10-06 DIAGNOSIS — E1342 Other specified diabetes mellitus with diabetic polyneuropathy: Secondary | ICD-10-CM | POA: Diagnosis not present

## 2014-10-06 DIAGNOSIS — I739 Peripheral vascular disease, unspecified: Secondary | ICD-10-CM | POA: Diagnosis not present

## 2014-10-07 DIAGNOSIS — E1129 Type 2 diabetes mellitus with other diabetic kidney complication: Secondary | ICD-10-CM | POA: Diagnosis not present

## 2014-10-07 DIAGNOSIS — D631 Anemia in chronic kidney disease: Secondary | ICD-10-CM | POA: Diagnosis not present

## 2014-10-07 DIAGNOSIS — N2581 Secondary hyperparathyroidism of renal origin: Secondary | ICD-10-CM | POA: Diagnosis not present

## 2014-10-07 DIAGNOSIS — D509 Iron deficiency anemia, unspecified: Secondary | ICD-10-CM | POA: Diagnosis not present

## 2014-10-07 DIAGNOSIS — N186 End stage renal disease: Secondary | ICD-10-CM | POA: Diagnosis not present

## 2014-10-09 DIAGNOSIS — N2581 Secondary hyperparathyroidism of renal origin: Secondary | ICD-10-CM | POA: Diagnosis not present

## 2014-10-09 DIAGNOSIS — N186 End stage renal disease: Secondary | ICD-10-CM | POA: Diagnosis not present

## 2014-10-09 DIAGNOSIS — D631 Anemia in chronic kidney disease: Secondary | ICD-10-CM | POA: Diagnosis not present

## 2014-10-09 DIAGNOSIS — D509 Iron deficiency anemia, unspecified: Secondary | ICD-10-CM | POA: Diagnosis not present

## 2014-10-09 DIAGNOSIS — E1129 Type 2 diabetes mellitus with other diabetic kidney complication: Secondary | ICD-10-CM | POA: Diagnosis not present

## 2014-10-10 DIAGNOSIS — N186 End stage renal disease: Secondary | ICD-10-CM | POA: Diagnosis not present

## 2014-10-10 DIAGNOSIS — E1142 Type 2 diabetes mellitus with diabetic polyneuropathy: Secondary | ICD-10-CM | POA: Diagnosis not present

## 2014-10-10 DIAGNOSIS — Z89612 Acquired absence of left leg above knee: Secondary | ICD-10-CM | POA: Diagnosis not present

## 2014-10-10 DIAGNOSIS — Z993 Dependence on wheelchair: Secondary | ICD-10-CM | POA: Diagnosis not present

## 2014-10-11 DIAGNOSIS — D631 Anemia in chronic kidney disease: Secondary | ICD-10-CM | POA: Diagnosis not present

## 2014-10-11 DIAGNOSIS — N2581 Secondary hyperparathyroidism of renal origin: Secondary | ICD-10-CM | POA: Diagnosis not present

## 2014-10-11 DIAGNOSIS — D509 Iron deficiency anemia, unspecified: Secondary | ICD-10-CM | POA: Diagnosis not present

## 2014-10-11 DIAGNOSIS — N186 End stage renal disease: Secondary | ICD-10-CM | POA: Diagnosis not present

## 2014-10-11 DIAGNOSIS — E1129 Type 2 diabetes mellitus with other diabetic kidney complication: Secondary | ICD-10-CM | POA: Diagnosis not present

## 2014-10-14 DIAGNOSIS — D631 Anemia in chronic kidney disease: Secondary | ICD-10-CM | POA: Diagnosis not present

## 2014-10-14 DIAGNOSIS — N2581 Secondary hyperparathyroidism of renal origin: Secondary | ICD-10-CM | POA: Diagnosis not present

## 2014-10-14 DIAGNOSIS — D509 Iron deficiency anemia, unspecified: Secondary | ICD-10-CM | POA: Diagnosis not present

## 2014-10-14 DIAGNOSIS — N186 End stage renal disease: Secondary | ICD-10-CM | POA: Diagnosis not present

## 2014-10-14 DIAGNOSIS — E1129 Type 2 diabetes mellitus with other diabetic kidney complication: Secondary | ICD-10-CM | POA: Diagnosis not present

## 2014-10-16 DIAGNOSIS — D509 Iron deficiency anemia, unspecified: Secondary | ICD-10-CM | POA: Diagnosis not present

## 2014-10-16 DIAGNOSIS — D631 Anemia in chronic kidney disease: Secondary | ICD-10-CM | POA: Diagnosis not present

## 2014-10-16 DIAGNOSIS — N2581 Secondary hyperparathyroidism of renal origin: Secondary | ICD-10-CM | POA: Diagnosis not present

## 2014-10-16 DIAGNOSIS — N186 End stage renal disease: Secondary | ICD-10-CM | POA: Diagnosis not present

## 2014-10-16 DIAGNOSIS — E1129 Type 2 diabetes mellitus with other diabetic kidney complication: Secondary | ICD-10-CM | POA: Diagnosis not present

## 2014-10-18 DIAGNOSIS — E1129 Type 2 diabetes mellitus with other diabetic kidney complication: Secondary | ICD-10-CM | POA: Diagnosis not present

## 2014-10-18 DIAGNOSIS — D631 Anemia in chronic kidney disease: Secondary | ICD-10-CM | POA: Diagnosis not present

## 2014-10-18 DIAGNOSIS — N2581 Secondary hyperparathyroidism of renal origin: Secondary | ICD-10-CM | POA: Diagnosis not present

## 2014-10-18 DIAGNOSIS — D509 Iron deficiency anemia, unspecified: Secondary | ICD-10-CM | POA: Diagnosis not present

## 2014-10-18 DIAGNOSIS — N186 End stage renal disease: Secondary | ICD-10-CM | POA: Diagnosis not present

## 2014-10-21 DIAGNOSIS — D509 Iron deficiency anemia, unspecified: Secondary | ICD-10-CM | POA: Diagnosis not present

## 2014-10-21 DIAGNOSIS — D631 Anemia in chronic kidney disease: Secondary | ICD-10-CM | POA: Diagnosis not present

## 2014-10-21 DIAGNOSIS — N186 End stage renal disease: Secondary | ICD-10-CM | POA: Diagnosis not present

## 2014-10-21 DIAGNOSIS — E1129 Type 2 diabetes mellitus with other diabetic kidney complication: Secondary | ICD-10-CM | POA: Diagnosis not present

## 2014-10-21 DIAGNOSIS — N2581 Secondary hyperparathyroidism of renal origin: Secondary | ICD-10-CM | POA: Diagnosis not present

## 2014-10-23 DIAGNOSIS — N2581 Secondary hyperparathyroidism of renal origin: Secondary | ICD-10-CM | POA: Diagnosis not present

## 2014-10-23 DIAGNOSIS — E1129 Type 2 diabetes mellitus with other diabetic kidney complication: Secondary | ICD-10-CM | POA: Diagnosis not present

## 2014-10-23 DIAGNOSIS — N186 End stage renal disease: Secondary | ICD-10-CM | POA: Diagnosis not present

## 2014-10-23 DIAGNOSIS — D631 Anemia in chronic kidney disease: Secondary | ICD-10-CM | POA: Diagnosis not present

## 2014-10-23 DIAGNOSIS — D509 Iron deficiency anemia, unspecified: Secondary | ICD-10-CM | POA: Diagnosis not present

## 2014-10-25 DIAGNOSIS — D509 Iron deficiency anemia, unspecified: Secondary | ICD-10-CM | POA: Diagnosis not present

## 2014-10-25 DIAGNOSIS — E1129 Type 2 diabetes mellitus with other diabetic kidney complication: Secondary | ICD-10-CM | POA: Diagnosis not present

## 2014-10-25 DIAGNOSIS — N186 End stage renal disease: Secondary | ICD-10-CM | POA: Diagnosis not present

## 2014-10-25 DIAGNOSIS — N2581 Secondary hyperparathyroidism of renal origin: Secondary | ICD-10-CM | POA: Diagnosis not present

## 2014-10-25 DIAGNOSIS — D631 Anemia in chronic kidney disease: Secondary | ICD-10-CM | POA: Diagnosis not present

## 2014-10-28 DIAGNOSIS — D509 Iron deficiency anemia, unspecified: Secondary | ICD-10-CM | POA: Diagnosis not present

## 2014-10-28 DIAGNOSIS — B999 Unspecified infectious disease: Secondary | ICD-10-CM | POA: Diagnosis not present

## 2014-10-28 DIAGNOSIS — Z992 Dependence on renal dialysis: Secondary | ICD-10-CM | POA: Diagnosis not present

## 2014-10-28 DIAGNOSIS — N186 End stage renal disease: Secondary | ICD-10-CM | POA: Diagnosis not present

## 2014-10-28 DIAGNOSIS — E1129 Type 2 diabetes mellitus with other diabetic kidney complication: Secondary | ICD-10-CM | POA: Diagnosis not present

## 2014-10-28 DIAGNOSIS — N2581 Secondary hyperparathyroidism of renal origin: Secondary | ICD-10-CM | POA: Diagnosis not present

## 2014-10-28 DIAGNOSIS — D631 Anemia in chronic kidney disease: Secondary | ICD-10-CM | POA: Diagnosis not present

## 2014-10-30 ENCOUNTER — Other Ambulatory Visit (HOSPITAL_COMMUNITY): Payer: Self-pay | Admitting: Nephrology

## 2014-10-30 DIAGNOSIS — N186 End stage renal disease: Secondary | ICD-10-CM | POA: Diagnosis not present

## 2014-10-30 DIAGNOSIS — E1129 Type 2 diabetes mellitus with other diabetic kidney complication: Secondary | ICD-10-CM | POA: Diagnosis not present

## 2014-10-30 DIAGNOSIS — D631 Anemia in chronic kidney disease: Secondary | ICD-10-CM | POA: Diagnosis not present

## 2014-10-30 DIAGNOSIS — R319 Hematuria, unspecified: Secondary | ICD-10-CM

## 2014-10-30 DIAGNOSIS — N2581 Secondary hyperparathyroidism of renal origin: Secondary | ICD-10-CM | POA: Diagnosis not present

## 2014-10-31 ENCOUNTER — Other Ambulatory Visit (HOSPITAL_COMMUNITY): Payer: Self-pay | Admitting: Medical

## 2014-10-31 DIAGNOSIS — R319 Hematuria, unspecified: Secondary | ICD-10-CM

## 2014-11-01 DIAGNOSIS — D631 Anemia in chronic kidney disease: Secondary | ICD-10-CM | POA: Diagnosis not present

## 2014-11-01 DIAGNOSIS — E1129 Type 2 diabetes mellitus with other diabetic kidney complication: Secondary | ICD-10-CM | POA: Diagnosis not present

## 2014-11-01 DIAGNOSIS — N2581 Secondary hyperparathyroidism of renal origin: Secondary | ICD-10-CM | POA: Diagnosis not present

## 2014-11-01 DIAGNOSIS — N186 End stage renal disease: Secondary | ICD-10-CM | POA: Diagnosis not present

## 2014-11-04 ENCOUNTER — Ambulatory Visit (HOSPITAL_COMMUNITY)
Admission: RE | Admit: 2014-11-04 | Discharge: 2014-11-04 | Disposition: A | Discharge status: Home / Self Care | Payer: Medicare Other | Source: Ambulatory Visit | Attending: Nephrology | Admitting: Nephrology

## 2014-11-04 DIAGNOSIS — N186 End stage renal disease: Secondary | ICD-10-CM | POA: Insufficient documentation

## 2014-11-04 DIAGNOSIS — D631 Anemia in chronic kidney disease: Secondary | ICD-10-CM | POA: Diagnosis not present

## 2014-11-04 DIAGNOSIS — N3289 Other specified disorders of bladder: Secondary | ICD-10-CM | POA: Diagnosis not present

## 2014-11-04 DIAGNOSIS — R319 Hematuria, unspecified: Secondary | ICD-10-CM | POA: Diagnosis not present

## 2014-11-04 DIAGNOSIS — Z794 Long term (current) use of insulin: Secondary | ICD-10-CM | POA: Diagnosis not present

## 2014-11-04 DIAGNOSIS — Z87891 Personal history of nicotine dependence: Secondary | ICD-10-CM | POA: Insufficient documentation

## 2014-11-04 DIAGNOSIS — R197 Diarrhea, unspecified: Secondary | ICD-10-CM | POA: Diagnosis not present

## 2014-11-04 DIAGNOSIS — E1129 Type 2 diabetes mellitus with other diabetic kidney complication: Secondary | ICD-10-CM | POA: Diagnosis not present

## 2014-11-04 DIAGNOSIS — N2581 Secondary hyperparathyroidism of renal origin: Secondary | ICD-10-CM | POA: Diagnosis not present

## 2014-11-04 DIAGNOSIS — E119 Type 2 diabetes mellitus without complications: Secondary | ICD-10-CM | POA: Insufficient documentation

## 2014-11-04 MED ORDER — IOHEXOL 300 MG/ML  SOLN
125.0000 mL | Freq: Once | INTRAMUSCULAR | Status: AC | PRN
  Administered 2014-11-04: 125 mL via INTRAVENOUS

## 2014-11-04 MED ORDER — SODIUM CHLORIDE 0.9 % IV SOLN
INTRAVENOUS | Status: AC
  Filled 2014-11-04: qty 250

## 2014-11-06 DIAGNOSIS — N2581 Secondary hyperparathyroidism of renal origin: Secondary | ICD-10-CM | POA: Diagnosis not present

## 2014-11-06 DIAGNOSIS — E1129 Type 2 diabetes mellitus with other diabetic kidney complication: Secondary | ICD-10-CM | POA: Diagnosis not present

## 2014-11-06 DIAGNOSIS — N186 End stage renal disease: Secondary | ICD-10-CM | POA: Diagnosis not present

## 2014-11-06 DIAGNOSIS — D631 Anemia in chronic kidney disease: Secondary | ICD-10-CM | POA: Diagnosis not present

## 2014-11-08 DIAGNOSIS — D631 Anemia in chronic kidney disease: Secondary | ICD-10-CM | POA: Diagnosis not present

## 2014-11-08 DIAGNOSIS — N186 End stage renal disease: Secondary | ICD-10-CM | POA: Diagnosis not present

## 2014-11-08 DIAGNOSIS — N2581 Secondary hyperparathyroidism of renal origin: Secondary | ICD-10-CM | POA: Diagnosis not present

## 2014-11-08 DIAGNOSIS — E1129 Type 2 diabetes mellitus with other diabetic kidney complication: Secondary | ICD-10-CM | POA: Diagnosis not present

## 2014-11-11 DIAGNOSIS — N186 End stage renal disease: Secondary | ICD-10-CM | POA: Diagnosis not present

## 2014-11-11 DIAGNOSIS — N2581 Secondary hyperparathyroidism of renal origin: Secondary | ICD-10-CM | POA: Diagnosis not present

## 2014-11-11 DIAGNOSIS — D631 Anemia in chronic kidney disease: Secondary | ICD-10-CM | POA: Diagnosis not present

## 2014-11-11 DIAGNOSIS — E1129 Type 2 diabetes mellitus with other diabetic kidney complication: Secondary | ICD-10-CM | POA: Diagnosis not present

## 2014-11-13 DIAGNOSIS — N2581 Secondary hyperparathyroidism of renal origin: Secondary | ICD-10-CM | POA: Diagnosis not present

## 2014-11-13 DIAGNOSIS — D631 Anemia in chronic kidney disease: Secondary | ICD-10-CM | POA: Diagnosis not present

## 2014-11-13 DIAGNOSIS — E1129 Type 2 diabetes mellitus with other diabetic kidney complication: Secondary | ICD-10-CM | POA: Diagnosis not present

## 2014-11-13 DIAGNOSIS — N186 End stage renal disease: Secondary | ICD-10-CM | POA: Diagnosis not present

## 2014-11-15 DIAGNOSIS — N186 End stage renal disease: Secondary | ICD-10-CM | POA: Diagnosis not present

## 2014-11-15 DIAGNOSIS — D631 Anemia in chronic kidney disease: Secondary | ICD-10-CM | POA: Diagnosis not present

## 2014-11-15 DIAGNOSIS — E1129 Type 2 diabetes mellitus with other diabetic kidney complication: Secondary | ICD-10-CM | POA: Diagnosis not present

## 2014-11-15 DIAGNOSIS — N2581 Secondary hyperparathyroidism of renal origin: Secondary | ICD-10-CM | POA: Diagnosis not present

## 2014-11-18 DIAGNOSIS — D631 Anemia in chronic kidney disease: Secondary | ICD-10-CM | POA: Diagnosis not present

## 2014-11-18 DIAGNOSIS — E1129 Type 2 diabetes mellitus with other diabetic kidney complication: Secondary | ICD-10-CM | POA: Diagnosis not present

## 2014-11-18 DIAGNOSIS — N186 End stage renal disease: Secondary | ICD-10-CM | POA: Diagnosis not present

## 2014-11-18 DIAGNOSIS — N2581 Secondary hyperparathyroidism of renal origin: Secondary | ICD-10-CM | POA: Diagnosis not present

## 2014-11-20 DIAGNOSIS — D631 Anemia in chronic kidney disease: Secondary | ICD-10-CM | POA: Diagnosis not present

## 2014-11-20 DIAGNOSIS — N186 End stage renal disease: Secondary | ICD-10-CM | POA: Diagnosis not present

## 2014-11-20 DIAGNOSIS — E1129 Type 2 diabetes mellitus with other diabetic kidney complication: Secondary | ICD-10-CM | POA: Diagnosis not present

## 2014-11-20 DIAGNOSIS — N2581 Secondary hyperparathyroidism of renal origin: Secondary | ICD-10-CM | POA: Diagnosis not present

## 2014-11-22 DIAGNOSIS — N186 End stage renal disease: Secondary | ICD-10-CM | POA: Diagnosis not present

## 2014-11-22 DIAGNOSIS — E1129 Type 2 diabetes mellitus with other diabetic kidney complication: Secondary | ICD-10-CM | POA: Diagnosis not present

## 2014-11-22 DIAGNOSIS — D631 Anemia in chronic kidney disease: Secondary | ICD-10-CM | POA: Diagnosis not present

## 2014-11-22 DIAGNOSIS — N2581 Secondary hyperparathyroidism of renal origin: Secondary | ICD-10-CM | POA: Diagnosis not present

## 2014-11-25 DIAGNOSIS — N2581 Secondary hyperparathyroidism of renal origin: Secondary | ICD-10-CM | POA: Diagnosis not present

## 2014-11-25 DIAGNOSIS — D631 Anemia in chronic kidney disease: Secondary | ICD-10-CM | POA: Diagnosis not present

## 2014-11-25 DIAGNOSIS — N186 End stage renal disease: Secondary | ICD-10-CM | POA: Diagnosis not present

## 2014-11-25 DIAGNOSIS — E1129 Type 2 diabetes mellitus with other diabetic kidney complication: Secondary | ICD-10-CM | POA: Diagnosis not present

## 2014-11-27 DIAGNOSIS — N2581 Secondary hyperparathyroidism of renal origin: Secondary | ICD-10-CM | POA: Diagnosis not present

## 2014-11-27 DIAGNOSIS — N186 End stage renal disease: Secondary | ICD-10-CM | POA: Diagnosis not present

## 2014-11-27 DIAGNOSIS — Z992 Dependence on renal dialysis: Secondary | ICD-10-CM | POA: Diagnosis not present

## 2014-11-27 DIAGNOSIS — E1129 Type 2 diabetes mellitus with other diabetic kidney complication: Secondary | ICD-10-CM | POA: Diagnosis not present

## 2014-11-27 DIAGNOSIS — D631 Anemia in chronic kidney disease: Secondary | ICD-10-CM | POA: Diagnosis not present

## 2014-11-29 DIAGNOSIS — N2581 Secondary hyperparathyroidism of renal origin: Secondary | ICD-10-CM | POA: Diagnosis not present

## 2014-11-29 DIAGNOSIS — N186 End stage renal disease: Secondary | ICD-10-CM | POA: Diagnosis not present

## 2014-11-29 DIAGNOSIS — D631 Anemia in chronic kidney disease: Secondary | ICD-10-CM | POA: Diagnosis not present

## 2014-11-29 DIAGNOSIS — E1129 Type 2 diabetes mellitus with other diabetic kidney complication: Secondary | ICD-10-CM | POA: Diagnosis not present

## 2014-12-02 DIAGNOSIS — N2581 Secondary hyperparathyroidism of renal origin: Secondary | ICD-10-CM | POA: Diagnosis not present

## 2014-12-02 DIAGNOSIS — E1129 Type 2 diabetes mellitus with other diabetic kidney complication: Secondary | ICD-10-CM | POA: Diagnosis not present

## 2014-12-02 DIAGNOSIS — D631 Anemia in chronic kidney disease: Secondary | ICD-10-CM | POA: Diagnosis not present

## 2014-12-02 DIAGNOSIS — N186 End stage renal disease: Secondary | ICD-10-CM | POA: Diagnosis not present

## 2014-12-04 DIAGNOSIS — E1129 Type 2 diabetes mellitus with other diabetic kidney complication: Secondary | ICD-10-CM | POA: Diagnosis not present

## 2014-12-04 DIAGNOSIS — N2889 Other specified disorders of kidney and ureter: Secondary | ICD-10-CM | POA: Diagnosis not present

## 2014-12-04 DIAGNOSIS — N186 End stage renal disease: Secondary | ICD-10-CM | POA: Diagnosis not present

## 2014-12-04 DIAGNOSIS — Z992 Dependence on renal dialysis: Secondary | ICD-10-CM | POA: Diagnosis not present

## 2014-12-04 DIAGNOSIS — D631 Anemia in chronic kidney disease: Secondary | ICD-10-CM | POA: Diagnosis not present

## 2014-12-04 DIAGNOSIS — N2581 Secondary hyperparathyroidism of renal origin: Secondary | ICD-10-CM | POA: Diagnosis not present

## 2014-12-04 DIAGNOSIS — R310 Gross hematuria: Secondary | ICD-10-CM | POA: Diagnosis not present

## 2014-12-06 DIAGNOSIS — N186 End stage renal disease: Secondary | ICD-10-CM | POA: Diagnosis not present

## 2014-12-06 DIAGNOSIS — N2581 Secondary hyperparathyroidism of renal origin: Secondary | ICD-10-CM | POA: Diagnosis not present

## 2014-12-06 DIAGNOSIS — E1129 Type 2 diabetes mellitus with other diabetic kidney complication: Secondary | ICD-10-CM | POA: Diagnosis not present

## 2014-12-06 DIAGNOSIS — D631 Anemia in chronic kidney disease: Secondary | ICD-10-CM | POA: Diagnosis not present

## 2014-12-08 DIAGNOSIS — M19012 Primary osteoarthritis, left shoulder: Secondary | ICD-10-CM | POA: Diagnosis not present

## 2014-12-08 DIAGNOSIS — G546 Phantom limb syndrome with pain: Secondary | ICD-10-CM | POA: Diagnosis not present

## 2014-12-08 DIAGNOSIS — Z79891 Long term (current) use of opiate analgesic: Secondary | ICD-10-CM | POA: Diagnosis not present

## 2014-12-08 DIAGNOSIS — N186 End stage renal disease: Secondary | ICD-10-CM | POA: Diagnosis not present

## 2014-12-09 DIAGNOSIS — E1129 Type 2 diabetes mellitus with other diabetic kidney complication: Secondary | ICD-10-CM | POA: Diagnosis not present

## 2014-12-09 DIAGNOSIS — N2581 Secondary hyperparathyroidism of renal origin: Secondary | ICD-10-CM | POA: Diagnosis not present

## 2014-12-09 DIAGNOSIS — N186 End stage renal disease: Secondary | ICD-10-CM | POA: Diagnosis not present

## 2014-12-09 DIAGNOSIS — D631 Anemia in chronic kidney disease: Secondary | ICD-10-CM | POA: Diagnosis not present

## 2014-12-10 NOTE — Op Note (Signed)
PATIENT NAME:  [name], [name] MR#:  [id number] DATE OF BIRTH:  26-May-1946  DATE OF PROCEDURE:  02/29/2012  PREOPERATIVE DIAGNOSES:  1. Endstage renal disease.  2. Gram-negative rod bacteremia.  3. 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   POSTOPERATIVE DIAGNOSES:  1. Endstage renal disease.  2. Gram-negative rod bacteremia.  3. 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   PROCEDURE: 1. Ultrasound guidance for vascular access, right femoral vein.  2. Placement of right femoral Trialysis-type dialysis catheter.  3. Removal of right jugular PermCath.  4. Removal of infected left arm AV graft.  5. Repair of left brachial artery for pseudoaneurysm.   SURGEON: Algernon Huxley, M.D.   ANESTHESIA: General.   ESTIMATED BLOOD LOSS: Approximately 200 mL.   INDICATION FOR PROCEDURE: 69 year old African American female with endstage renal disease. She had a graft placed approximately six weeks ago. She had had other access in the left arm that had failed. She was admitted two days prior. She had bacteremia which was gram-negative rods that was felt to be from either urinary or pulmonary source. Apparently she had positive blood cultures about three weeks ago at an outside institution and we were not initially aware of these. At this point she has a large pseudoaneurysm near the arterial anastomosis of her graft and her graft has to be presumed to be infected as does her existing PermCath, which has been in for many months. These best be removed and her a temporary dialysis catheter will be placed for initiation of hemodialysis while in the hospital. I discussed with the patient if her graft was well incorporated and did not appear infected, we may be able to salvage it, but I thought this was somewhat unlikely. Risks and benefits were discussed. Informed consent was obtained.   DESCRIPTION OF PROCEDURE: The patient was brought to the operative  suite and after an adequate level of general anesthesia was obtained, initially the right groin was sterilely prepped and draped and a sterile surgical field was created.  The right femoral vein was visualized with ultrasound and was found to be widely patent. It was then accessed under direct ultrasound guidance without difficulty with a Seldinger needle and a J-wire was placed. After skin and dilatation, the Trialysis-type dialysis catheter 30 cm in length was placed over the wire and the wire was removed. All three lumens withdrew dark red nonpulsatile blood and flushed easily with sterile saline. It was secured with 3 nylon sutures and a sterile dressing was placed.   I then turned my attention to the PermCath. The jugular PermCath was then dissected free with hemostats and a #11 blade was used to transect the fibrous sheath connected to the cuff. The catheter was then removed in its entirety. Pressure was held and sterile dressing was placed.   At this point, we turned our attention to the left arm. The left arm was sterilely prepped and draped, and a sterile surgical field was created. A long incision was created to allow [place] to gain proximal control of the brachial artery prior to the pseudoaneurysm. This was tedious as she has had many operations in this area, but we were able to gain control to the artery.  A vessel loop was placed around the artery. I then identified some of the graft. The arterial anastomosis was clearly disrupted as we opened the pseudoaneurysm. A large amount of thrombus was removed. There graft was taken off of the artery. Control was  gained with a vascular clamp on the distal portion of the artery and the patient was heparinized. I then dissected out the graft at the venous anastomosis and detached this, oversewing the vein with a running 5-0 Prolene suture. The graft then was removed in its entirety without difficulty as it was not incorporated and actually did not require  counterincision to do so.   The patient also had a previous graft off of the artery just above where this one had been, and it was in this area where there was a pocket of purulence that culture was sent from. The artery just above the area of removal of this graft also was opened and debrided. The back wall remained intact and I patched the artery closed with a CorMatrix extracellular patch with a 6-0 Prolene and a few 6-0 Prolene patch sutures were used for hemostasis. This restored flow distally and repaired the arterial defect. A medium Blake drain was run in the site of the old graft and tunneled out a counter incision in the forearm. The wound was irrigated. The superficial tissues were closed with 3-0 Vicryl. The skin was left open, although it was reasonably well approximated, and a VAC sponge was cut to fit the wound. Charlie Pitter was placed for good occlusive seal. This was connected to suction tubing and the VAC box.     The patient was then awakened from anesthesia and taken to the recovery room in stable condition having tolerated the procedure well.      ____________________________ Algernon Huxley, MD jsd:bjt D: 03/01/2012 11:28:11 ET T: 03/01/2012 11:54:31 ET JOB#: [id number]  cc: Algernon Huxley, MD, <Dictator> Darrold Span. Florene Glen, MD

## 2014-12-11 DIAGNOSIS — N186 End stage renal disease: Secondary | ICD-10-CM | POA: Diagnosis not present

## 2014-12-11 DIAGNOSIS — D631 Anemia in chronic kidney disease: Secondary | ICD-10-CM | POA: Diagnosis not present

## 2014-12-11 DIAGNOSIS — N2581 Secondary hyperparathyroidism of renal origin: Secondary | ICD-10-CM | POA: Diagnosis not present

## 2014-12-11 DIAGNOSIS — E1129 Type 2 diabetes mellitus with other diabetic kidney complication: Secondary | ICD-10-CM | POA: Diagnosis not present

## 2014-12-13 DIAGNOSIS — N186 End stage renal disease: Secondary | ICD-10-CM | POA: Diagnosis not present

## 2014-12-13 DIAGNOSIS — N2581 Secondary hyperparathyroidism of renal origin: Secondary | ICD-10-CM | POA: Diagnosis not present

## 2014-12-13 DIAGNOSIS — D631 Anemia in chronic kidney disease: Secondary | ICD-10-CM | POA: Diagnosis not present

## 2014-12-13 DIAGNOSIS — E1129 Type 2 diabetes mellitus with other diabetic kidney complication: Secondary | ICD-10-CM | POA: Diagnosis not present

## 2014-12-15 DIAGNOSIS — I739 Peripheral vascular disease, unspecified: Secondary | ICD-10-CM | POA: Diagnosis not present

## 2014-12-15 DIAGNOSIS — N186 End stage renal disease: Secondary | ICD-10-CM | POA: Diagnosis not present

## 2014-12-15 DIAGNOSIS — E1342 Other specified diabetes mellitus with diabetic polyneuropathy: Secondary | ICD-10-CM | POA: Diagnosis not present

## 2014-12-15 DIAGNOSIS — B351 Tinea unguium: Secondary | ICD-10-CM | POA: Diagnosis not present

## 2014-12-16 DIAGNOSIS — N2581 Secondary hyperparathyroidism of renal origin: Secondary | ICD-10-CM | POA: Diagnosis not present

## 2014-12-16 DIAGNOSIS — E1129 Type 2 diabetes mellitus with other diabetic kidney complication: Secondary | ICD-10-CM | POA: Diagnosis not present

## 2014-12-16 DIAGNOSIS — N186 End stage renal disease: Secondary | ICD-10-CM | POA: Diagnosis not present

## 2014-12-16 DIAGNOSIS — D631 Anemia in chronic kidney disease: Secondary | ICD-10-CM | POA: Diagnosis not present

## 2014-12-18 DIAGNOSIS — E785 Hyperlipidemia, unspecified: Secondary | ICD-10-CM | POA: Diagnosis not present

## 2014-12-18 DIAGNOSIS — N2581 Secondary hyperparathyroidism of renal origin: Secondary | ICD-10-CM | POA: Diagnosis not present

## 2014-12-18 DIAGNOSIS — E1129 Type 2 diabetes mellitus with other diabetic kidney complication: Secondary | ICD-10-CM | POA: Diagnosis not present

## 2014-12-18 DIAGNOSIS — D631 Anemia in chronic kidney disease: Secondary | ICD-10-CM | POA: Diagnosis not present

## 2014-12-18 DIAGNOSIS — N186 End stage renal disease: Secondary | ICD-10-CM | POA: Diagnosis not present

## 2014-12-20 DIAGNOSIS — N2581 Secondary hyperparathyroidism of renal origin: Secondary | ICD-10-CM | POA: Diagnosis not present

## 2014-12-20 DIAGNOSIS — D631 Anemia in chronic kidney disease: Secondary | ICD-10-CM | POA: Diagnosis not present

## 2014-12-20 DIAGNOSIS — N186 End stage renal disease: Secondary | ICD-10-CM | POA: Diagnosis not present

## 2014-12-20 DIAGNOSIS — E1129 Type 2 diabetes mellitus with other diabetic kidney complication: Secondary | ICD-10-CM | POA: Diagnosis not present

## 2014-12-23 DIAGNOSIS — D631 Anemia in chronic kidney disease: Secondary | ICD-10-CM | POA: Diagnosis not present

## 2014-12-23 DIAGNOSIS — N186 End stage renal disease: Secondary | ICD-10-CM | POA: Diagnosis not present

## 2014-12-23 DIAGNOSIS — N2581 Secondary hyperparathyroidism of renal origin: Secondary | ICD-10-CM | POA: Diagnosis not present

## 2014-12-23 DIAGNOSIS — E1129 Type 2 diabetes mellitus with other diabetic kidney complication: Secondary | ICD-10-CM | POA: Diagnosis not present

## 2014-12-25 DIAGNOSIS — D631 Anemia in chronic kidney disease: Secondary | ICD-10-CM | POA: Diagnosis not present

## 2014-12-25 DIAGNOSIS — E1129 Type 2 diabetes mellitus with other diabetic kidney complication: Secondary | ICD-10-CM | POA: Diagnosis not present

## 2014-12-25 DIAGNOSIS — N2581 Secondary hyperparathyroidism of renal origin: Secondary | ICD-10-CM | POA: Diagnosis not present

## 2014-12-25 DIAGNOSIS — N186 End stage renal disease: Secondary | ICD-10-CM | POA: Diagnosis not present

## 2014-12-26 DIAGNOSIS — Z23 Encounter for immunization: Secondary | ICD-10-CM | POA: Diagnosis not present

## 2014-12-26 DIAGNOSIS — E1342 Other specified diabetes mellitus with diabetic polyneuropathy: Secondary | ICD-10-CM | POA: Diagnosis not present

## 2014-12-26 DIAGNOSIS — I10 Essential (primary) hypertension: Secondary | ICD-10-CM | POA: Diagnosis not present

## 2014-12-26 DIAGNOSIS — N186 End stage renal disease: Secondary | ICD-10-CM | POA: Diagnosis not present

## 2014-12-26 DIAGNOSIS — Z89612 Acquired absence of left leg above knee: Secondary | ICD-10-CM | POA: Diagnosis not present

## 2014-12-27 DIAGNOSIS — D631 Anemia in chronic kidney disease: Secondary | ICD-10-CM | POA: Diagnosis not present

## 2014-12-27 DIAGNOSIS — N2581 Secondary hyperparathyroidism of renal origin: Secondary | ICD-10-CM | POA: Diagnosis not present

## 2014-12-27 DIAGNOSIS — E1129 Type 2 diabetes mellitus with other diabetic kidney complication: Secondary | ICD-10-CM | POA: Diagnosis not present

## 2014-12-27 DIAGNOSIS — N186 End stage renal disease: Secondary | ICD-10-CM | POA: Diagnosis not present

## 2014-12-28 DIAGNOSIS — E1129 Type 2 diabetes mellitus with other diabetic kidney complication: Secondary | ICD-10-CM | POA: Diagnosis not present

## 2014-12-28 DIAGNOSIS — N186 End stage renal disease: Secondary | ICD-10-CM | POA: Diagnosis not present

## 2014-12-28 DIAGNOSIS — Z992 Dependence on renal dialysis: Secondary | ICD-10-CM | POA: Diagnosis not present

## 2014-12-30 DIAGNOSIS — N2581 Secondary hyperparathyroidism of renal origin: Secondary | ICD-10-CM | POA: Diagnosis not present

## 2014-12-30 DIAGNOSIS — D509 Iron deficiency anemia, unspecified: Secondary | ICD-10-CM | POA: Diagnosis not present

## 2014-12-30 DIAGNOSIS — E1129 Type 2 diabetes mellitus with other diabetic kidney complication: Secondary | ICD-10-CM | POA: Diagnosis not present

## 2014-12-30 DIAGNOSIS — N186 End stage renal disease: Secondary | ICD-10-CM | POA: Diagnosis not present

## 2014-12-30 DIAGNOSIS — D631 Anemia in chronic kidney disease: Secondary | ICD-10-CM | POA: Diagnosis not present

## 2015-01-01 DIAGNOSIS — E1129 Type 2 diabetes mellitus with other diabetic kidney complication: Secondary | ICD-10-CM | POA: Diagnosis not present

## 2015-01-01 DIAGNOSIS — D509 Iron deficiency anemia, unspecified: Secondary | ICD-10-CM | POA: Diagnosis not present

## 2015-01-01 DIAGNOSIS — N2581 Secondary hyperparathyroidism of renal origin: Secondary | ICD-10-CM | POA: Diagnosis not present

## 2015-01-01 DIAGNOSIS — N186 End stage renal disease: Secondary | ICD-10-CM | POA: Diagnosis not present

## 2015-01-01 DIAGNOSIS — D631 Anemia in chronic kidney disease: Secondary | ICD-10-CM | POA: Diagnosis not present

## 2015-01-03 DIAGNOSIS — D631 Anemia in chronic kidney disease: Secondary | ICD-10-CM | POA: Diagnosis not present

## 2015-01-03 DIAGNOSIS — E1129 Type 2 diabetes mellitus with other diabetic kidney complication: Secondary | ICD-10-CM | POA: Diagnosis not present

## 2015-01-03 DIAGNOSIS — N2581 Secondary hyperparathyroidism of renal origin: Secondary | ICD-10-CM | POA: Diagnosis not present

## 2015-01-03 DIAGNOSIS — D509 Iron deficiency anemia, unspecified: Secondary | ICD-10-CM | POA: Diagnosis not present

## 2015-01-03 DIAGNOSIS — N186 End stage renal disease: Secondary | ICD-10-CM | POA: Diagnosis not present

## 2015-01-06 DIAGNOSIS — N186 End stage renal disease: Secondary | ICD-10-CM | POA: Diagnosis not present

## 2015-01-06 DIAGNOSIS — D509 Iron deficiency anemia, unspecified: Secondary | ICD-10-CM | POA: Diagnosis not present

## 2015-01-06 DIAGNOSIS — N2581 Secondary hyperparathyroidism of renal origin: Secondary | ICD-10-CM | POA: Diagnosis not present

## 2015-01-06 DIAGNOSIS — D631 Anemia in chronic kidney disease: Secondary | ICD-10-CM | POA: Diagnosis not present

## 2015-01-06 DIAGNOSIS — E1129 Type 2 diabetes mellitus with other diabetic kidney complication: Secondary | ICD-10-CM | POA: Diagnosis not present

## 2015-01-08 DIAGNOSIS — N186 End stage renal disease: Secondary | ICD-10-CM | POA: Diagnosis not present

## 2015-01-08 DIAGNOSIS — D631 Anemia in chronic kidney disease: Secondary | ICD-10-CM | POA: Diagnosis not present

## 2015-01-08 DIAGNOSIS — D509 Iron deficiency anemia, unspecified: Secondary | ICD-10-CM | POA: Diagnosis not present

## 2015-01-08 DIAGNOSIS — E1129 Type 2 diabetes mellitus with other diabetic kidney complication: Secondary | ICD-10-CM | POA: Diagnosis not present

## 2015-01-08 DIAGNOSIS — N2581 Secondary hyperparathyroidism of renal origin: Secondary | ICD-10-CM | POA: Diagnosis not present

## 2015-01-10 DIAGNOSIS — N186 End stage renal disease: Secondary | ICD-10-CM | POA: Diagnosis not present

## 2015-01-10 DIAGNOSIS — D509 Iron deficiency anemia, unspecified: Secondary | ICD-10-CM | POA: Diagnosis not present

## 2015-01-10 DIAGNOSIS — E1129 Type 2 diabetes mellitus with other diabetic kidney complication: Secondary | ICD-10-CM | POA: Diagnosis not present

## 2015-01-10 DIAGNOSIS — D631 Anemia in chronic kidney disease: Secondary | ICD-10-CM | POA: Diagnosis not present

## 2015-01-10 DIAGNOSIS — N2581 Secondary hyperparathyroidism of renal origin: Secondary | ICD-10-CM | POA: Diagnosis not present

## 2015-01-13 DIAGNOSIS — N186 End stage renal disease: Secondary | ICD-10-CM | POA: Diagnosis not present

## 2015-01-13 DIAGNOSIS — D509 Iron deficiency anemia, unspecified: Secondary | ICD-10-CM | POA: Diagnosis not present

## 2015-01-13 DIAGNOSIS — E1129 Type 2 diabetes mellitus with other diabetic kidney complication: Secondary | ICD-10-CM | POA: Diagnosis not present

## 2015-01-13 DIAGNOSIS — N2581 Secondary hyperparathyroidism of renal origin: Secondary | ICD-10-CM | POA: Diagnosis not present

## 2015-01-13 DIAGNOSIS — D631 Anemia in chronic kidney disease: Secondary | ICD-10-CM | POA: Diagnosis not present

## 2015-01-15 DIAGNOSIS — E1129 Type 2 diabetes mellitus with other diabetic kidney complication: Secondary | ICD-10-CM | POA: Diagnosis not present

## 2015-01-15 DIAGNOSIS — D509 Iron deficiency anemia, unspecified: Secondary | ICD-10-CM | POA: Diagnosis not present

## 2015-01-15 DIAGNOSIS — D631 Anemia in chronic kidney disease: Secondary | ICD-10-CM | POA: Diagnosis not present

## 2015-01-15 DIAGNOSIS — N2581 Secondary hyperparathyroidism of renal origin: Secondary | ICD-10-CM | POA: Diagnosis not present

## 2015-01-15 DIAGNOSIS — N186 End stage renal disease: Secondary | ICD-10-CM | POA: Diagnosis not present

## 2015-01-17 DIAGNOSIS — N186 End stage renal disease: Secondary | ICD-10-CM | POA: Diagnosis not present

## 2015-01-17 DIAGNOSIS — D631 Anemia in chronic kidney disease: Secondary | ICD-10-CM | POA: Diagnosis not present

## 2015-01-17 DIAGNOSIS — E1129 Type 2 diabetes mellitus with other diabetic kidney complication: Secondary | ICD-10-CM | POA: Diagnosis not present

## 2015-01-17 DIAGNOSIS — D509 Iron deficiency anemia, unspecified: Secondary | ICD-10-CM | POA: Diagnosis not present

## 2015-01-17 DIAGNOSIS — N2581 Secondary hyperparathyroidism of renal origin: Secondary | ICD-10-CM | POA: Diagnosis not present

## 2015-01-20 DIAGNOSIS — D509 Iron deficiency anemia, unspecified: Secondary | ICD-10-CM | POA: Diagnosis not present

## 2015-01-20 DIAGNOSIS — N186 End stage renal disease: Secondary | ICD-10-CM | POA: Diagnosis not present

## 2015-01-20 DIAGNOSIS — E1129 Type 2 diabetes mellitus with other diabetic kidney complication: Secondary | ICD-10-CM | POA: Diagnosis not present

## 2015-01-20 DIAGNOSIS — N2581 Secondary hyperparathyroidism of renal origin: Secondary | ICD-10-CM | POA: Diagnosis not present

## 2015-01-20 DIAGNOSIS — D631 Anemia in chronic kidney disease: Secondary | ICD-10-CM | POA: Diagnosis not present

## 2015-01-22 DIAGNOSIS — N186 End stage renal disease: Secondary | ICD-10-CM | POA: Diagnosis not present

## 2015-01-22 DIAGNOSIS — N2581 Secondary hyperparathyroidism of renal origin: Secondary | ICD-10-CM | POA: Diagnosis not present

## 2015-01-22 DIAGNOSIS — E1129 Type 2 diabetes mellitus with other diabetic kidney complication: Secondary | ICD-10-CM | POA: Diagnosis not present

## 2015-01-22 DIAGNOSIS — D631 Anemia in chronic kidney disease: Secondary | ICD-10-CM | POA: Diagnosis not present

## 2015-01-22 DIAGNOSIS — D509 Iron deficiency anemia, unspecified: Secondary | ICD-10-CM | POA: Diagnosis not present

## 2015-01-24 DIAGNOSIS — N2581 Secondary hyperparathyroidism of renal origin: Secondary | ICD-10-CM | POA: Diagnosis not present

## 2015-01-24 DIAGNOSIS — D509 Iron deficiency anemia, unspecified: Secondary | ICD-10-CM | POA: Diagnosis not present

## 2015-01-24 DIAGNOSIS — D631 Anemia in chronic kidney disease: Secondary | ICD-10-CM | POA: Diagnosis not present

## 2015-01-24 DIAGNOSIS — E1129 Type 2 diabetes mellitus with other diabetic kidney complication: Secondary | ICD-10-CM | POA: Diagnosis not present

## 2015-01-24 DIAGNOSIS — N186 End stage renal disease: Secondary | ICD-10-CM | POA: Diagnosis not present

## 2015-01-27 DIAGNOSIS — E1129 Type 2 diabetes mellitus with other diabetic kidney complication: Secondary | ICD-10-CM | POA: Diagnosis not present

## 2015-01-27 DIAGNOSIS — D631 Anemia in chronic kidney disease: Secondary | ICD-10-CM | POA: Diagnosis not present

## 2015-01-27 DIAGNOSIS — N186 End stage renal disease: Secondary | ICD-10-CM | POA: Diagnosis not present

## 2015-01-27 DIAGNOSIS — N2581 Secondary hyperparathyroidism of renal origin: Secondary | ICD-10-CM | POA: Diagnosis not present

## 2015-01-27 DIAGNOSIS — D509 Iron deficiency anemia, unspecified: Secondary | ICD-10-CM | POA: Diagnosis not present

## 2015-01-28 DIAGNOSIS — Z992 Dependence on renal dialysis: Secondary | ICD-10-CM | POA: Diagnosis not present

## 2015-01-28 DIAGNOSIS — E1129 Type 2 diabetes mellitus with other diabetic kidney complication: Secondary | ICD-10-CM | POA: Diagnosis not present

## 2015-01-28 DIAGNOSIS — N186 End stage renal disease: Secondary | ICD-10-CM | POA: Diagnosis not present

## 2015-01-29 DIAGNOSIS — E1129 Type 2 diabetes mellitus with other diabetic kidney complication: Secondary | ICD-10-CM | POA: Diagnosis not present

## 2015-01-29 DIAGNOSIS — Z23 Encounter for immunization: Secondary | ICD-10-CM | POA: Diagnosis not present

## 2015-01-29 DIAGNOSIS — D509 Iron deficiency anemia, unspecified: Secondary | ICD-10-CM | POA: Diagnosis not present

## 2015-01-29 DIAGNOSIS — N186 End stage renal disease: Secondary | ICD-10-CM | POA: Diagnosis not present

## 2015-01-31 DIAGNOSIS — D509 Iron deficiency anemia, unspecified: Secondary | ICD-10-CM | POA: Diagnosis not present

## 2015-01-31 DIAGNOSIS — Z23 Encounter for immunization: Secondary | ICD-10-CM | POA: Diagnosis not present

## 2015-01-31 DIAGNOSIS — N186 End stage renal disease: Secondary | ICD-10-CM | POA: Diagnosis not present

## 2015-01-31 DIAGNOSIS — E1129 Type 2 diabetes mellitus with other diabetic kidney complication: Secondary | ICD-10-CM | POA: Diagnosis not present

## 2015-02-03 DIAGNOSIS — Z23 Encounter for immunization: Secondary | ICD-10-CM | POA: Diagnosis not present

## 2015-02-03 DIAGNOSIS — N186 End stage renal disease: Secondary | ICD-10-CM | POA: Diagnosis not present

## 2015-02-03 DIAGNOSIS — D509 Iron deficiency anemia, unspecified: Secondary | ICD-10-CM | POA: Diagnosis not present

## 2015-02-03 DIAGNOSIS — E1129 Type 2 diabetes mellitus with other diabetic kidney complication: Secondary | ICD-10-CM | POA: Diagnosis not present

## 2015-02-05 DIAGNOSIS — D509 Iron deficiency anemia, unspecified: Secondary | ICD-10-CM | POA: Diagnosis not present

## 2015-02-05 DIAGNOSIS — N186 End stage renal disease: Secondary | ICD-10-CM | POA: Diagnosis not present

## 2015-02-05 DIAGNOSIS — Z23 Encounter for immunization: Secondary | ICD-10-CM | POA: Diagnosis not present

## 2015-02-05 DIAGNOSIS — E1129 Type 2 diabetes mellitus with other diabetic kidney complication: Secondary | ICD-10-CM | POA: Diagnosis not present

## 2015-02-07 DIAGNOSIS — Z23 Encounter for immunization: Secondary | ICD-10-CM | POA: Diagnosis not present

## 2015-02-07 DIAGNOSIS — D509 Iron deficiency anemia, unspecified: Secondary | ICD-10-CM | POA: Diagnosis not present

## 2015-02-07 DIAGNOSIS — N186 End stage renal disease: Secondary | ICD-10-CM | POA: Diagnosis not present

## 2015-02-07 DIAGNOSIS — E1129 Type 2 diabetes mellitus with other diabetic kidney complication: Secondary | ICD-10-CM | POA: Diagnosis not present

## 2015-02-10 DIAGNOSIS — E1129 Type 2 diabetes mellitus with other diabetic kidney complication: Secondary | ICD-10-CM | POA: Diagnosis not present

## 2015-02-10 DIAGNOSIS — N186 End stage renal disease: Secondary | ICD-10-CM | POA: Diagnosis not present

## 2015-02-10 DIAGNOSIS — D509 Iron deficiency anemia, unspecified: Secondary | ICD-10-CM | POA: Diagnosis not present

## 2015-02-10 DIAGNOSIS — Z23 Encounter for immunization: Secondary | ICD-10-CM | POA: Diagnosis not present

## 2015-02-12 DIAGNOSIS — D509 Iron deficiency anemia, unspecified: Secondary | ICD-10-CM | POA: Diagnosis not present

## 2015-02-12 DIAGNOSIS — E1129 Type 2 diabetes mellitus with other diabetic kidney complication: Secondary | ICD-10-CM | POA: Diagnosis not present

## 2015-02-12 DIAGNOSIS — Z23 Encounter for immunization: Secondary | ICD-10-CM | POA: Diagnosis not present

## 2015-02-12 DIAGNOSIS — N186 End stage renal disease: Secondary | ICD-10-CM | POA: Diagnosis not present

## 2015-02-14 DIAGNOSIS — D509 Iron deficiency anemia, unspecified: Secondary | ICD-10-CM | POA: Diagnosis not present

## 2015-02-14 DIAGNOSIS — N186 End stage renal disease: Secondary | ICD-10-CM | POA: Diagnosis not present

## 2015-02-14 DIAGNOSIS — Z23 Encounter for immunization: Secondary | ICD-10-CM | POA: Diagnosis not present

## 2015-02-14 DIAGNOSIS — E1129 Type 2 diabetes mellitus with other diabetic kidney complication: Secondary | ICD-10-CM | POA: Diagnosis not present

## 2015-02-17 DIAGNOSIS — E1129 Type 2 diabetes mellitus with other diabetic kidney complication: Secondary | ICD-10-CM | POA: Diagnosis not present

## 2015-02-17 DIAGNOSIS — D509 Iron deficiency anemia, unspecified: Secondary | ICD-10-CM | POA: Diagnosis not present

## 2015-02-17 DIAGNOSIS — N186 End stage renal disease: Secondary | ICD-10-CM | POA: Diagnosis not present

## 2015-02-17 DIAGNOSIS — Z23 Encounter for immunization: Secondary | ICD-10-CM | POA: Diagnosis not present

## 2015-02-19 DIAGNOSIS — N186 End stage renal disease: Secondary | ICD-10-CM | POA: Diagnosis not present

## 2015-02-19 DIAGNOSIS — Z23 Encounter for immunization: Secondary | ICD-10-CM | POA: Diagnosis not present

## 2015-02-19 DIAGNOSIS — D509 Iron deficiency anemia, unspecified: Secondary | ICD-10-CM | POA: Diagnosis not present

## 2015-02-19 DIAGNOSIS — E1129 Type 2 diabetes mellitus with other diabetic kidney complication: Secondary | ICD-10-CM | POA: Diagnosis not present

## 2015-02-21 DIAGNOSIS — E1129 Type 2 diabetes mellitus with other diabetic kidney complication: Secondary | ICD-10-CM | POA: Diagnosis not present

## 2015-02-21 DIAGNOSIS — Z23 Encounter for immunization: Secondary | ICD-10-CM | POA: Diagnosis not present

## 2015-02-21 DIAGNOSIS — N186 End stage renal disease: Secondary | ICD-10-CM | POA: Diagnosis not present

## 2015-02-21 DIAGNOSIS — D509 Iron deficiency anemia, unspecified: Secondary | ICD-10-CM | POA: Diagnosis not present

## 2015-02-24 DIAGNOSIS — N186 End stage renal disease: Secondary | ICD-10-CM | POA: Diagnosis not present

## 2015-02-24 DIAGNOSIS — E1129 Type 2 diabetes mellitus with other diabetic kidney complication: Secondary | ICD-10-CM | POA: Diagnosis not present

## 2015-02-24 DIAGNOSIS — D509 Iron deficiency anemia, unspecified: Secondary | ICD-10-CM | POA: Diagnosis not present

## 2015-02-24 DIAGNOSIS — Z23 Encounter for immunization: Secondary | ICD-10-CM | POA: Diagnosis not present

## 2015-02-26 DIAGNOSIS — Z23 Encounter for immunization: Secondary | ICD-10-CM | POA: Diagnosis not present

## 2015-02-26 DIAGNOSIS — E1129 Type 2 diabetes mellitus with other diabetic kidney complication: Secondary | ICD-10-CM | POA: Diagnosis not present

## 2015-02-26 DIAGNOSIS — D509 Iron deficiency anemia, unspecified: Secondary | ICD-10-CM | POA: Diagnosis not present

## 2015-02-26 DIAGNOSIS — N186 End stage renal disease: Secondary | ICD-10-CM | POA: Diagnosis not present

## 2015-02-27 DIAGNOSIS — N186 End stage renal disease: Secondary | ICD-10-CM | POA: Diagnosis not present

## 2015-02-27 DIAGNOSIS — Z992 Dependence on renal dialysis: Secondary | ICD-10-CM | POA: Diagnosis not present

## 2015-02-27 DIAGNOSIS — E1129 Type 2 diabetes mellitus with other diabetic kidney complication: Secondary | ICD-10-CM | POA: Diagnosis not present

## 2015-02-28 DIAGNOSIS — N186 End stage renal disease: Secondary | ICD-10-CM | POA: Diagnosis not present

## 2015-02-28 DIAGNOSIS — E1129 Type 2 diabetes mellitus with other diabetic kidney complication: Secondary | ICD-10-CM | POA: Diagnosis not present

## 2015-02-28 DIAGNOSIS — Z23 Encounter for immunization: Secondary | ICD-10-CM | POA: Diagnosis not present

## 2015-02-28 DIAGNOSIS — D631 Anemia in chronic kidney disease: Secondary | ICD-10-CM | POA: Diagnosis not present

## 2015-03-03 DIAGNOSIS — D631 Anemia in chronic kidney disease: Secondary | ICD-10-CM | POA: Diagnosis not present

## 2015-03-03 DIAGNOSIS — E1129 Type 2 diabetes mellitus with other diabetic kidney complication: Secondary | ICD-10-CM | POA: Diagnosis not present

## 2015-03-03 DIAGNOSIS — Z23 Encounter for immunization: Secondary | ICD-10-CM | POA: Diagnosis not present

## 2015-03-03 DIAGNOSIS — N186 End stage renal disease: Secondary | ICD-10-CM | POA: Diagnosis not present

## 2015-03-05 DIAGNOSIS — Z23 Encounter for immunization: Secondary | ICD-10-CM | POA: Diagnosis not present

## 2015-03-05 DIAGNOSIS — D631 Anemia in chronic kidney disease: Secondary | ICD-10-CM | POA: Diagnosis not present

## 2015-03-05 DIAGNOSIS — N186 End stage renal disease: Secondary | ICD-10-CM | POA: Diagnosis not present

## 2015-03-05 DIAGNOSIS — E1129 Type 2 diabetes mellitus with other diabetic kidney complication: Secondary | ICD-10-CM | POA: Diagnosis not present

## 2015-03-07 DIAGNOSIS — Z23 Encounter for immunization: Secondary | ICD-10-CM | POA: Diagnosis not present

## 2015-03-07 DIAGNOSIS — D631 Anemia in chronic kidney disease: Secondary | ICD-10-CM | POA: Diagnosis not present

## 2015-03-07 DIAGNOSIS — N186 End stage renal disease: Secondary | ICD-10-CM | POA: Diagnosis not present

## 2015-03-07 DIAGNOSIS — E1129 Type 2 diabetes mellitus with other diabetic kidney complication: Secondary | ICD-10-CM | POA: Diagnosis not present

## 2015-03-09 DIAGNOSIS — M19012 Primary osteoarthritis, left shoulder: Secondary | ICD-10-CM | POA: Diagnosis not present

## 2015-03-09 DIAGNOSIS — Z79891 Long term (current) use of opiate analgesic: Secondary | ICD-10-CM | POA: Diagnosis not present

## 2015-03-09 DIAGNOSIS — G546 Phantom limb syndrome with pain: Secondary | ICD-10-CM | POA: Diagnosis not present

## 2015-03-09 DIAGNOSIS — E119 Type 2 diabetes mellitus without complications: Secondary | ICD-10-CM | POA: Diagnosis not present

## 2015-03-10 DIAGNOSIS — E1129 Type 2 diabetes mellitus with other diabetic kidney complication: Secondary | ICD-10-CM | POA: Diagnosis not present

## 2015-03-10 DIAGNOSIS — Z23 Encounter for immunization: Secondary | ICD-10-CM | POA: Diagnosis not present

## 2015-03-10 DIAGNOSIS — N186 End stage renal disease: Secondary | ICD-10-CM | POA: Diagnosis not present

## 2015-03-10 DIAGNOSIS — D631 Anemia in chronic kidney disease: Secondary | ICD-10-CM | POA: Diagnosis not present

## 2015-03-12 DIAGNOSIS — D631 Anemia in chronic kidney disease: Secondary | ICD-10-CM | POA: Diagnosis not present

## 2015-03-12 DIAGNOSIS — E1129 Type 2 diabetes mellitus with other diabetic kidney complication: Secondary | ICD-10-CM | POA: Diagnosis not present

## 2015-03-12 DIAGNOSIS — Z23 Encounter for immunization: Secondary | ICD-10-CM | POA: Diagnosis not present

## 2015-03-12 DIAGNOSIS — N186 End stage renal disease: Secondary | ICD-10-CM | POA: Diagnosis not present

## 2015-03-14 DIAGNOSIS — D631 Anemia in chronic kidney disease: Secondary | ICD-10-CM | POA: Diagnosis not present

## 2015-03-14 DIAGNOSIS — E1129 Type 2 diabetes mellitus with other diabetic kidney complication: Secondary | ICD-10-CM | POA: Diagnosis not present

## 2015-03-14 DIAGNOSIS — N186 End stage renal disease: Secondary | ICD-10-CM | POA: Diagnosis not present

## 2015-03-14 DIAGNOSIS — Z23 Encounter for immunization: Secondary | ICD-10-CM | POA: Diagnosis not present

## 2015-03-17 DIAGNOSIS — E1129 Type 2 diabetes mellitus with other diabetic kidney complication: Secondary | ICD-10-CM | POA: Diagnosis not present

## 2015-03-17 DIAGNOSIS — Z23 Encounter for immunization: Secondary | ICD-10-CM | POA: Diagnosis not present

## 2015-03-17 DIAGNOSIS — N186 End stage renal disease: Secondary | ICD-10-CM | POA: Diagnosis not present

## 2015-03-17 DIAGNOSIS — D631 Anemia in chronic kidney disease: Secondary | ICD-10-CM | POA: Diagnosis not present

## 2015-03-19 DIAGNOSIS — D631 Anemia in chronic kidney disease: Secondary | ICD-10-CM | POA: Diagnosis not present

## 2015-03-19 DIAGNOSIS — N186 End stage renal disease: Secondary | ICD-10-CM | POA: Diagnosis not present

## 2015-03-19 DIAGNOSIS — Z23 Encounter for immunization: Secondary | ICD-10-CM | POA: Diagnosis not present

## 2015-03-19 DIAGNOSIS — E1129 Type 2 diabetes mellitus with other diabetic kidney complication: Secondary | ICD-10-CM | POA: Diagnosis not present

## 2015-03-20 DIAGNOSIS — E1140 Type 2 diabetes mellitus with diabetic neuropathy, unspecified: Secondary | ICD-10-CM | POA: Diagnosis not present

## 2015-03-20 DIAGNOSIS — M5489 Other dorsalgia: Secondary | ICD-10-CM | POA: Diagnosis not present

## 2015-03-20 DIAGNOSIS — I10 Essential (primary) hypertension: Secondary | ICD-10-CM | POA: Diagnosis not present

## 2015-03-20 DIAGNOSIS — N186 End stage renal disease: Secondary | ICD-10-CM | POA: Diagnosis not present

## 2015-03-21 DIAGNOSIS — E1129 Type 2 diabetes mellitus with other diabetic kidney complication: Secondary | ICD-10-CM | POA: Diagnosis not present

## 2015-03-21 DIAGNOSIS — D631 Anemia in chronic kidney disease: Secondary | ICD-10-CM | POA: Diagnosis not present

## 2015-03-21 DIAGNOSIS — N186 End stage renal disease: Secondary | ICD-10-CM | POA: Diagnosis not present

## 2015-03-21 DIAGNOSIS — Z23 Encounter for immunization: Secondary | ICD-10-CM | POA: Diagnosis not present

## 2015-03-24 DIAGNOSIS — Z23 Encounter for immunization: Secondary | ICD-10-CM | POA: Diagnosis not present

## 2015-03-24 DIAGNOSIS — E1129 Type 2 diabetes mellitus with other diabetic kidney complication: Secondary | ICD-10-CM | POA: Diagnosis not present

## 2015-03-24 DIAGNOSIS — N186 End stage renal disease: Secondary | ICD-10-CM | POA: Diagnosis not present

## 2015-03-24 DIAGNOSIS — D631 Anemia in chronic kidney disease: Secondary | ICD-10-CM | POA: Diagnosis not present

## 2015-03-26 DIAGNOSIS — D631 Anemia in chronic kidney disease: Secondary | ICD-10-CM | POA: Diagnosis not present

## 2015-03-26 DIAGNOSIS — Z23 Encounter for immunization: Secondary | ICD-10-CM | POA: Diagnosis not present

## 2015-03-26 DIAGNOSIS — N186 End stage renal disease: Secondary | ICD-10-CM | POA: Diagnosis not present

## 2015-03-26 DIAGNOSIS — E1129 Type 2 diabetes mellitus with other diabetic kidney complication: Secondary | ICD-10-CM | POA: Diagnosis not present

## 2015-03-28 DIAGNOSIS — N186 End stage renal disease: Secondary | ICD-10-CM | POA: Diagnosis not present

## 2015-03-28 DIAGNOSIS — E1129 Type 2 diabetes mellitus with other diabetic kidney complication: Secondary | ICD-10-CM | POA: Diagnosis not present

## 2015-03-28 DIAGNOSIS — D631 Anemia in chronic kidney disease: Secondary | ICD-10-CM | POA: Diagnosis not present

## 2015-03-28 DIAGNOSIS — Z23 Encounter for immunization: Secondary | ICD-10-CM | POA: Diagnosis not present

## 2015-03-30 DIAGNOSIS — E1129 Type 2 diabetes mellitus with other diabetic kidney complication: Secondary | ICD-10-CM | POA: Diagnosis not present

## 2015-03-30 DIAGNOSIS — Z992 Dependence on renal dialysis: Secondary | ICD-10-CM | POA: Diagnosis not present

## 2015-03-30 DIAGNOSIS — N186 End stage renal disease: Secondary | ICD-10-CM | POA: Diagnosis not present

## 2015-03-31 DIAGNOSIS — E1129 Type 2 diabetes mellitus with other diabetic kidney complication: Secondary | ICD-10-CM | POA: Diagnosis not present

## 2015-03-31 DIAGNOSIS — N186 End stage renal disease: Secondary | ICD-10-CM | POA: Diagnosis not present

## 2015-03-31 DIAGNOSIS — D631 Anemia in chronic kidney disease: Secondary | ICD-10-CM | POA: Diagnosis not present

## 2015-04-02 DIAGNOSIS — D631 Anemia in chronic kidney disease: Secondary | ICD-10-CM | POA: Diagnosis not present

## 2015-04-02 DIAGNOSIS — E1129 Type 2 diabetes mellitus with other diabetic kidney complication: Secondary | ICD-10-CM | POA: Diagnosis not present

## 2015-04-02 DIAGNOSIS — N186 End stage renal disease: Secondary | ICD-10-CM | POA: Diagnosis not present

## 2015-04-04 DIAGNOSIS — D631 Anemia in chronic kidney disease: Secondary | ICD-10-CM | POA: Diagnosis not present

## 2015-04-04 DIAGNOSIS — E1129 Type 2 diabetes mellitus with other diabetic kidney complication: Secondary | ICD-10-CM | POA: Diagnosis not present

## 2015-04-04 DIAGNOSIS — N186 End stage renal disease: Secondary | ICD-10-CM | POA: Diagnosis not present

## 2015-04-07 DIAGNOSIS — E1129 Type 2 diabetes mellitus with other diabetic kidney complication: Secondary | ICD-10-CM | POA: Diagnosis not present

## 2015-04-07 DIAGNOSIS — N186 End stage renal disease: Secondary | ICD-10-CM | POA: Diagnosis not present

## 2015-04-07 DIAGNOSIS — D631 Anemia in chronic kidney disease: Secondary | ICD-10-CM | POA: Diagnosis not present

## 2015-04-09 DIAGNOSIS — D631 Anemia in chronic kidney disease: Secondary | ICD-10-CM | POA: Diagnosis not present

## 2015-04-09 DIAGNOSIS — E1129 Type 2 diabetes mellitus with other diabetic kidney complication: Secondary | ICD-10-CM | POA: Diagnosis not present

## 2015-04-09 DIAGNOSIS — N186 End stage renal disease: Secondary | ICD-10-CM | POA: Diagnosis not present

## 2015-04-11 DIAGNOSIS — N186 End stage renal disease: Secondary | ICD-10-CM | POA: Diagnosis not present

## 2015-04-11 DIAGNOSIS — E1129 Type 2 diabetes mellitus with other diabetic kidney complication: Secondary | ICD-10-CM | POA: Diagnosis not present

## 2015-04-11 DIAGNOSIS — D631 Anemia in chronic kidney disease: Secondary | ICD-10-CM | POA: Diagnosis not present

## 2015-04-14 DIAGNOSIS — E1129 Type 2 diabetes mellitus with other diabetic kidney complication: Secondary | ICD-10-CM | POA: Diagnosis not present

## 2015-04-14 DIAGNOSIS — D631 Anemia in chronic kidney disease: Secondary | ICD-10-CM | POA: Diagnosis not present

## 2015-04-14 DIAGNOSIS — N186 End stage renal disease: Secondary | ICD-10-CM | POA: Diagnosis not present

## 2015-04-16 DIAGNOSIS — N186 End stage renal disease: Secondary | ICD-10-CM | POA: Diagnosis not present

## 2015-04-16 DIAGNOSIS — E1129 Type 2 diabetes mellitus with other diabetic kidney complication: Secondary | ICD-10-CM | POA: Diagnosis not present

## 2015-04-16 DIAGNOSIS — D631 Anemia in chronic kidney disease: Secondary | ICD-10-CM | POA: Diagnosis not present

## 2015-04-18 DIAGNOSIS — D631 Anemia in chronic kidney disease: Secondary | ICD-10-CM | POA: Diagnosis not present

## 2015-04-18 DIAGNOSIS — E1129 Type 2 diabetes mellitus with other diabetic kidney complication: Secondary | ICD-10-CM | POA: Diagnosis not present

## 2015-04-18 DIAGNOSIS — N186 End stage renal disease: Secondary | ICD-10-CM | POA: Diagnosis not present

## 2015-04-20 DIAGNOSIS — E1129 Type 2 diabetes mellitus with other diabetic kidney complication: Secondary | ICD-10-CM | POA: Diagnosis not present

## 2015-04-20 DIAGNOSIS — D631 Anemia in chronic kidney disease: Secondary | ICD-10-CM | POA: Diagnosis not present

## 2015-04-20 DIAGNOSIS — N186 End stage renal disease: Secondary | ICD-10-CM | POA: Diagnosis not present

## 2015-04-22 DIAGNOSIS — D631 Anemia in chronic kidney disease: Secondary | ICD-10-CM | POA: Diagnosis not present

## 2015-04-22 DIAGNOSIS — N186 End stage renal disease: Secondary | ICD-10-CM | POA: Diagnosis not present

## 2015-04-22 DIAGNOSIS — E1129 Type 2 diabetes mellitus with other diabetic kidney complication: Secondary | ICD-10-CM | POA: Diagnosis not present

## 2015-04-25 DIAGNOSIS — E1129 Type 2 diabetes mellitus with other diabetic kidney complication: Secondary | ICD-10-CM | POA: Diagnosis not present

## 2015-04-25 DIAGNOSIS — N186 End stage renal disease: Secondary | ICD-10-CM | POA: Diagnosis not present

## 2015-04-25 DIAGNOSIS — D631 Anemia in chronic kidney disease: Secondary | ICD-10-CM | POA: Diagnosis not present

## 2015-04-28 DIAGNOSIS — E1129 Type 2 diabetes mellitus with other diabetic kidney complication: Secondary | ICD-10-CM | POA: Diagnosis not present

## 2015-04-28 DIAGNOSIS — D631 Anemia in chronic kidney disease: Secondary | ICD-10-CM | POA: Diagnosis not present

## 2015-04-28 DIAGNOSIS — N186 End stage renal disease: Secondary | ICD-10-CM | POA: Diagnosis not present

## 2015-04-29 DIAGNOSIS — N186 End stage renal disease: Secondary | ICD-10-CM | POA: Diagnosis not present

## 2015-04-29 DIAGNOSIS — Z992 Dependence on renal dialysis: Secondary | ICD-10-CM | POA: Diagnosis not present

## 2015-04-29 DIAGNOSIS — E1129 Type 2 diabetes mellitus with other diabetic kidney complication: Secondary | ICD-10-CM | POA: Diagnosis not present

## 2015-04-30 DIAGNOSIS — N186 End stage renal disease: Secondary | ICD-10-CM | POA: Diagnosis not present

## 2015-04-30 DIAGNOSIS — D631 Anemia in chronic kidney disease: Secondary | ICD-10-CM | POA: Diagnosis not present

## 2015-04-30 DIAGNOSIS — E1129 Type 2 diabetes mellitus with other diabetic kidney complication: Secondary | ICD-10-CM | POA: Diagnosis not present

## 2015-05-02 DIAGNOSIS — E1129 Type 2 diabetes mellitus with other diabetic kidney complication: Secondary | ICD-10-CM | POA: Diagnosis not present

## 2015-05-02 DIAGNOSIS — D631 Anemia in chronic kidney disease: Secondary | ICD-10-CM | POA: Diagnosis not present

## 2015-05-02 DIAGNOSIS — N186 End stage renal disease: Secondary | ICD-10-CM | POA: Diagnosis not present

## 2015-05-05 DIAGNOSIS — E1129 Type 2 diabetes mellitus with other diabetic kidney complication: Secondary | ICD-10-CM | POA: Diagnosis not present

## 2015-05-05 DIAGNOSIS — D631 Anemia in chronic kidney disease: Secondary | ICD-10-CM | POA: Diagnosis not present

## 2015-05-05 DIAGNOSIS — N186 End stage renal disease: Secondary | ICD-10-CM | POA: Diagnosis not present

## 2015-05-07 DIAGNOSIS — E1129 Type 2 diabetes mellitus with other diabetic kidney complication: Secondary | ICD-10-CM | POA: Diagnosis not present

## 2015-05-07 DIAGNOSIS — D631 Anemia in chronic kidney disease: Secondary | ICD-10-CM | POA: Diagnosis not present

## 2015-05-07 DIAGNOSIS — N186 End stage renal disease: Secondary | ICD-10-CM | POA: Diagnosis not present

## 2015-05-09 DIAGNOSIS — D631 Anemia in chronic kidney disease: Secondary | ICD-10-CM | POA: Diagnosis not present

## 2015-05-09 DIAGNOSIS — E1129 Type 2 diabetes mellitus with other diabetic kidney complication: Secondary | ICD-10-CM | POA: Diagnosis not present

## 2015-05-09 DIAGNOSIS — N186 End stage renal disease: Secondary | ICD-10-CM | POA: Diagnosis not present

## 2015-05-12 DIAGNOSIS — N186 End stage renal disease: Secondary | ICD-10-CM | POA: Diagnosis not present

## 2015-05-12 DIAGNOSIS — D631 Anemia in chronic kidney disease: Secondary | ICD-10-CM | POA: Diagnosis not present

## 2015-05-12 DIAGNOSIS — E1129 Type 2 diabetes mellitus with other diabetic kidney complication: Secondary | ICD-10-CM | POA: Diagnosis not present

## 2015-05-14 DIAGNOSIS — E1129 Type 2 diabetes mellitus with other diabetic kidney complication: Secondary | ICD-10-CM | POA: Diagnosis not present

## 2015-05-14 DIAGNOSIS — D631 Anemia in chronic kidney disease: Secondary | ICD-10-CM | POA: Diagnosis not present

## 2015-05-14 DIAGNOSIS — N186 End stage renal disease: Secondary | ICD-10-CM | POA: Diagnosis not present

## 2015-05-16 DIAGNOSIS — E1129 Type 2 diabetes mellitus with other diabetic kidney complication: Secondary | ICD-10-CM | POA: Diagnosis not present

## 2015-05-16 DIAGNOSIS — N186 End stage renal disease: Secondary | ICD-10-CM | POA: Diagnosis not present

## 2015-05-16 DIAGNOSIS — D631 Anemia in chronic kidney disease: Secondary | ICD-10-CM | POA: Diagnosis not present

## 2015-05-19 DIAGNOSIS — N186 End stage renal disease: Secondary | ICD-10-CM | POA: Diagnosis not present

## 2015-05-19 DIAGNOSIS — E1129 Type 2 diabetes mellitus with other diabetic kidney complication: Secondary | ICD-10-CM | POA: Diagnosis not present

## 2015-05-19 DIAGNOSIS — D631 Anemia in chronic kidney disease: Secondary | ICD-10-CM | POA: Diagnosis not present

## 2015-05-21 DIAGNOSIS — E1129 Type 2 diabetes mellitus with other diabetic kidney complication: Secondary | ICD-10-CM | POA: Diagnosis not present

## 2015-05-21 DIAGNOSIS — D631 Anemia in chronic kidney disease: Secondary | ICD-10-CM | POA: Diagnosis not present

## 2015-05-21 DIAGNOSIS — N186 End stage renal disease: Secondary | ICD-10-CM | POA: Diagnosis not present

## 2015-05-22 DIAGNOSIS — R404 Transient alteration of awareness: Secondary | ICD-10-CM | POA: Diagnosis not present

## 2015-05-22 DIAGNOSIS — R531 Weakness: Secondary | ICD-10-CM | POA: Diagnosis not present

## 2015-05-23 DIAGNOSIS — E1129 Type 2 diabetes mellitus with other diabetic kidney complication: Secondary | ICD-10-CM | POA: Diagnosis not present

## 2015-05-23 DIAGNOSIS — N186 End stage renal disease: Secondary | ICD-10-CM | POA: Diagnosis not present

## 2015-05-23 DIAGNOSIS — D631 Anemia in chronic kidney disease: Secondary | ICD-10-CM | POA: Diagnosis not present

## 2015-05-24 ENCOUNTER — Encounter (HOSPITAL_COMMUNITY): Payer: Self-pay | Admitting: Emergency Medicine

## 2015-05-24 ENCOUNTER — Emergency Department (HOSPITAL_COMMUNITY): Payer: Medicare Other

## 2015-05-24 ENCOUNTER — Emergency Department (HOSPITAL_COMMUNITY)
Admission: EM | Admit: 2015-05-24 | Discharge: 2015-05-24 | Disposition: A | Discharge status: Home / Self Care | Payer: Medicare Other | Attending: Emergency Medicine | Admitting: Emergency Medicine

## 2015-05-24 DIAGNOSIS — T50995A Adverse effect of other drugs, medicaments and biological substances, initial encounter: Secondary | ICD-10-CM | POA: Diagnosis not present

## 2015-05-24 DIAGNOSIS — Z760 Encounter for issue of repeat prescription: Secondary | ICD-10-CM | POA: Diagnosis not present

## 2015-05-24 DIAGNOSIS — M1990 Unspecified osteoarthritis, unspecified site: Secondary | ICD-10-CM | POA: Insufficient documentation

## 2015-05-24 DIAGNOSIS — R251 Tremor, unspecified: Secondary | ICD-10-CM | POA: Diagnosis not present

## 2015-05-24 DIAGNOSIS — E079 Disorder of thyroid, unspecified: Secondary | ICD-10-CM | POA: Insufficient documentation

## 2015-05-24 DIAGNOSIS — Z8673 Personal history of transient ischemic attack (TIA), and cerebral infarction without residual deficits: Secondary | ICD-10-CM | POA: Insufficient documentation

## 2015-05-24 DIAGNOSIS — Z87891 Personal history of nicotine dependence: Secondary | ICD-10-CM | POA: Insufficient documentation

## 2015-05-24 DIAGNOSIS — Z794 Long term (current) use of insulin: Secondary | ICD-10-CM | POA: Insufficient documentation

## 2015-05-24 DIAGNOSIS — E039 Hypothyroidism, unspecified: Secondary | ICD-10-CM | POA: Insufficient documentation

## 2015-05-24 DIAGNOSIS — M25512 Pain in left shoulder: Secondary | ICD-10-CM | POA: Diagnosis not present

## 2015-05-24 DIAGNOSIS — E119 Type 2 diabetes mellitus without complications: Secondary | ICD-10-CM | POA: Insufficient documentation

## 2015-05-24 DIAGNOSIS — J449 Chronic obstructive pulmonary disease, unspecified: Secondary | ICD-10-CM | POA: Insufficient documentation

## 2015-05-24 DIAGNOSIS — I739 Peripheral vascular disease, unspecified: Secondary | ICD-10-CM | POA: Insufficient documentation

## 2015-05-24 DIAGNOSIS — G8929 Other chronic pain: Secondary | ICD-10-CM | POA: Diagnosis not present

## 2015-05-24 DIAGNOSIS — R531 Weakness: Secondary | ICD-10-CM | POA: Diagnosis not present

## 2015-05-24 DIAGNOSIS — Z79899 Other long term (current) drug therapy: Secondary | ICD-10-CM | POA: Diagnosis not present

## 2015-05-24 DIAGNOSIS — J811 Chronic pulmonary edema: Secondary | ICD-10-CM | POA: Diagnosis not present

## 2015-05-24 DIAGNOSIS — T50905A Adverse effect of unspecified drugs, medicaments and biological substances, initial encounter: Secondary | ICD-10-CM

## 2015-05-24 DIAGNOSIS — R404 Transient alteration of awareness: Secondary | ICD-10-CM | POA: Diagnosis not present

## 2015-05-24 LAB — BASIC METABOLIC PANEL
ANION GAP: 13 (ref 5–15)
BUN: 35 mg/dL — AB (ref 6–20)
CHLORIDE: 96 mmol/L — AB (ref 101–111)
CO2: 29 mmol/L (ref 22–32)
Calcium: 8.6 mg/dL — ABNORMAL LOW (ref 8.9–10.3)
Creatinine, Ser: 5.95 mg/dL — ABNORMAL HIGH (ref 0.44–1.00)
GFR, EST AFRICAN AMERICAN: 8 mL/min — AB (ref >60)
GFR, EST NON AFRICAN AMERICAN: 7 mL/min — AB (ref >60)
Glucose, Bld: 66 mg/dL (ref 65–99)
POTASSIUM: 3.9 mmol/L (ref 3.5–5.1)
SODIUM: 138 mmol/L (ref 135–145)

## 2015-05-24 LAB — CBC WITH DIFFERENTIAL/PLATELET
BASOS ABS: 0 10*3/uL (ref 0.0–0.1)
Basophils Relative: 1 %
EOS ABS: 0.2 10*3/uL (ref 0.0–0.7)
Eosinophils Relative: 3 %
HCT: 36.1 % (ref 36.0–46.0)
HEMOGLOBIN: 10.8 g/dL — AB (ref 12.0–15.0)
LYMPHS ABS: 1.9 10*3/uL (ref 0.7–4.0)
Lymphocytes Relative: 28 %
MCH: 26.9 pg (ref 26.0–34.0)
MCHC: 29.9 g/dL — ABNORMAL LOW (ref 30.0–36.0)
MCV: 90 fL (ref 78.0–100.0)
Monocytes Absolute: 0.6 10*3/uL (ref 0.1–1.0)
Monocytes Relative: 10 %
NEUTROS PCT: 58 %
Neutro Abs: 3.8 10*3/uL (ref 1.7–7.7)
Platelets: 154 10*3/uL (ref 150–400)
RBC: 4.01 MIL/uL (ref 3.87–5.11)
RDW: 18.8 % — ABNORMAL HIGH (ref 11.5–15.5)
WBC: 6.6 10*3/uL (ref 4.0–10.5)

## 2015-05-24 LAB — MAGNESIUM: MAGNESIUM: 2.1 mg/dL (ref 1.7–2.4)

## 2015-05-24 LAB — CK: CK TOTAL: 70 U/L (ref 38–234)

## 2015-05-24 NOTE — ED Provider Notes (Signed)
CSN: [id number]     Arrival date & time 05/24/15  0757 History   First MD Initiated Contact with Patient 05/24/15 0759     Chief Complaint  Patient presents with  . Tremors      HPI   Patient presents for evaluation of "my body seems to be tightening up".  History of end-stage renal disease on 3 times weekly maintenance hemodialysis. Dialyzed without difficulty, and on schedule yesterday.  History of chronic pain primarily neck back and shoulders. Seen by pain management 3 days ago. Started on a new 3 times a day pain medication. She states that it "makes me have bad dreams and hallucinate". She did not take it yesterday morning or yesterday afternoon. However, she took it again last night. She again had a night where she felt like she was dreaming of hallucinating all might. She does not recall the name of the medication.  Psychiatric some greater effort than usual for her to roll over in bed. 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  Causes diffuse she feels like the muscles are tightening up her neck back and shoulder and she wasn't sure what to take for it because of the new medication side effects.    Past Medical History  Diagnosis Date  . COPD (chronic obstructive pulmonary disease) ([place])   . Diabetes mellitus   . Hypertension   . Thyroid disease   . Hyperlipidemia   . Bronchitis   . Leg pain   . Refusal of blood transfusions as patient is Jehovah's Witness     patient is Air cabin crew witness  . Peripheral vascular disease ([place])   . Hypothyroidism   . Arthritis     knee  . Family history of anesthesia complication     SON- VOMITS  . Stroke ([name])   . ESRD on hemodialysis ([place])     [facility]. Started HD November 26, 2009. 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  Marland Kitchen Diarrhea     not constant- frequent   Past Surgical History  Procedure Laterality Date  . Cholecystectomy    . Dg av dialysis shunt access exist*r* or      working right HD  catheter  . Av fistula placement  05/18/2010  . Femur im nail  05/20/2012    Procedure: INTRAMEDULLARY (IM) RETROGRADE FEMORAL NAILING;  Surgeon: Rozanna Box, MD;  Location: [facility];  Service: Orthopedics;  Laterality: Left;  . Amputation Left 07/25/2012    Procedure: LEFT AMPUTATION BELOW KNEE;  Surgeon: Newt Minion, MD;  Location: [facility];  Service: Orthopedics;  Laterality: Left;  Left Below Knee Amputation  . Bascilic vein transposition Right 11/23/2012    Procedure: RIGHT BASCILIC VEIN TRANSPOSITION ;  Surgeon: Angelia Mould, MD;  Location[facility];  Service: Vascular;  Laterality: Right;   Family History  Problem Relation Age of Onset  . Diabetes Mother    Social History  Substance Use Topics  . Smoking status: Former Smoker -- 0.50 packs/day for 30 years    Types: Cigarettes    Quit date: 04/29/2012  . Smokeless tobacco: Never Used  . Alcohol Use: No   OB History    Gravida Para Term Preterm AB TAB SAB Ectopic Multiple Living            2     Review of Systems  Constitutional: Negative for fever, chills, diaphoresis, appetite change and fatigue.  HENT: Negative for mouth sores, sore throat and  trouble swallowing.   Eyes: Negative for visual disturbance.  Respiratory: Negative for cough, chest tightness, shortness of breath and wheezing.   Cardiovascular: Negative for chest pain.  Gastrointestinal: Negative for nausea, vomiting, abdominal pain, diarrhea and abdominal distention.  Endocrine: Negative for polydipsia, polyphagia and polyuria.  Genitourinary: Negative for dysuria, frequency and hematuria.  Musculoskeletal: Negative for gait problem.  Skin: Negative for color change, pallor and rash.  Neurological: Positive for tremors and weakness. Negative for dizziness, syncope, light-headedness and headaches.  Hematological: Does not bruise/bleed easily.  Psychiatric/Behavioral: Positive for hallucinations and sleep disturbance. Negative for behavioral problems and  confusion.       "Bad dreams/nightmares"      Allergies  Contrast media  Home Medications   Prior to Admission medications   Medication Sig Start Date End Date Taking? Authorizing Provider  amLODipine (NORVASC) 5 MG tablet Take 1 tablet (5 mg total) by mouth daily. 06/04/12   Ripudeep Krystal Eaton, MD  calcium acetate (PHOSLO) 667 MG capsule Take 1 capsule (667 mg total) by mouth 2 (two) times daily with a meal. 06/04/12   Ripudeep Krystal Eaton, MD  HYDROcodone-acetaminophen (NORCO/VICODIN) 5-325 MG per tablet Take 1 tablet by mouth every 6 (six) hours as needed for moderate pain. 05/20/14   Carole Civil, MD  insulin aspart (NOVOLOG) 100 UNIT/ML injection Inject 0-15 Units into the skin every 4 (four) hours. CBG < 70: Drink juice; CBG 70 - 120: 0 units: CBG 121 - 150: 2 units; CBG 151 - 200: 3 units; CBG 201 - 250: 5 units; CBG 251 - 300: 8 units;CBG 301 - 350: 11 units; CBG 351 - 400: 15 units; CBG > 400 : 15 units and Call MD 06/01/12   Ripudeep Krystal Eaton, MD  levothyroxine (SYNTHROID, LEVOTHROID) 75 MCG tablet Take 75 mcg by mouth daily.      Historical Provider, MD  metoCLOPramide (REGLAN) 5 MG tablet Take 1-2 tablets (5-10 mg total) by mouth every 8 (eight) hours as needed (if ondansetron (ZOFRAN) ineffective.). 06/01/12   Ripudeep Krystal Eaton, MD  multivitamin (RENA-VIT) TABS tablet Take 1 tablet by mouth daily.    Historical Provider, MD  niacin (NIASPAN) 500 MG CR tablet Take 500 mg by mouth daily.     Historical Provider, MD  ondansetron (ZOFRAN) 4 MG tablet Take 1 tablet (4 mg total) by mouth every 6 (six) hours as needed for nausea. 06/01/12   Ripudeep Krystal Eaton, MD  oxyCODONE (ROXICODONE) 5 MG immediate release tablet Take 1 tablet (5 mg total) by mouth every 6 (six) hours as needed for pain. 11/23/12   Samantha J Rhyne, PA-C   BP 124/62 mmHg  Pulse 72  Temp(Src) 98.1 F (36.7 C) (Oral)  Resp 11  Wt 190 lb (86.183 kg)  SpO2 92% Physical Exam  Constitutional: [name] oriented to person, place, and time. She  appears well-developed and well-nourished. No distress.  HENT:  Head: Normocephalic.  Eyes: Conjunctivae are normal. Pupils are equal, round, and reactive to light. No scleral icterus.  Neck: Normal range of motion. Neck supple. No thyromegaly present.  Cardiovascular: Normal rate and regular rhythm.  Exam reveals no gallop and no friction rub.   No murmur heard. Pulmonary/Chest: Effort normal and breath sounds normal. No respiratory distress. She has no wheezes. She has no rales.  Abdominal: Soft. Bowel sounds are normal. She exhibits no distension. There is no tenderness. There is no rebound.  Musculoskeletal: Normal range of motion.       Arms:  Legs: Neurological: [name] alert and oriented to person, place, and time.  Skin: Skin is warm and dry. No rash noted.  Psychiatric: She has a normal mood and affect. Her behavior is normal.    ED Course  Procedures (including critical care time) Labs Review Labs Reviewed  CBC WITH DIFFERENTIAL/PLATELET - Abnormal; Notable for the following:    Hemoglobin 10.8 (*)    MCHC 29.9 (*)    RDW 18.8 (*)    All other components within normal limits  BASIC METABOLIC PANEL - Abnormal; Notable for the following:    Chloride 96 (*)    BUN 35 (*)    Creatinine, Ser 5.95 (*)    Calcium 8.6 (*)    GFR calc non Af Amer 7 (*)    GFR calc Af Amer 8 (*)    All other components within normal limits  MAGNESIUM  CK    Imaging Review Dg Chest Port 1 View  05/24/2015  CLINICAL DATA:  Weakness and tremors. EXAM: PORTABLE CHEST 1 VIEW COMPARISON:  05/31/2012 FINDINGS: Normal heart size. There is no pleural effusion or edema. Mild pulmonary vascular congestion noted. No airspace consolidation. IMPRESSION: 1. Mild pulmonary vascular congestion. Electronically Signed   By: Kerby Moors M.D.   On: 05/24/2015 08:50   I have personally reviewed and evaluated these images and lab results as part of my medical decision-making.   EKG Interpretation None        MDM   Final diagnoses:  Medication reaction, initial encounter    No acute changes on labs. Normal chest x-ray. Essentially asymptomatic here. Family is here. Stable and ready for discharge. Do not take or new medication. Follow-up with primary care, and pain management.    Tanna Furry, MD 05/24/15 1003

## 2015-05-24 NOTE — Discharge Instructions (Signed)
Stopped taking any prescribed medication.  All with your regular physician, and pain management physician.

## 2015-05-24 NOTE — ED Notes (Signed)
MD James at bedside.  

## 2015-05-24 NOTE — ED Notes (Signed)
Pt reports generalized muscle weakness and tremors that began 1 week ago. Pt reports that her symptoms worsened around 0300 this morning. Pt is a dialysis pt, last dialyzed on Friday. Denies hx of symptoms.

## 2015-05-26 DIAGNOSIS — D631 Anemia in chronic kidney disease: Secondary | ICD-10-CM | POA: Diagnosis not present

## 2015-05-26 DIAGNOSIS — N186 End stage renal disease: Secondary | ICD-10-CM | POA: Diagnosis not present

## 2015-05-26 DIAGNOSIS — E1129 Type 2 diabetes mellitus with other diabetic kidney complication: Secondary | ICD-10-CM | POA: Diagnosis not present

## 2015-05-28 DIAGNOSIS — E1129 Type 2 diabetes mellitus with other diabetic kidney complication: Secondary | ICD-10-CM | POA: Diagnosis not present

## 2015-05-28 DIAGNOSIS — D631 Anemia in chronic kidney disease: Secondary | ICD-10-CM | POA: Diagnosis not present

## 2015-05-28 DIAGNOSIS — N186 End stage renal disease: Secondary | ICD-10-CM | POA: Diagnosis not present

## 2015-05-30 DIAGNOSIS — N186 End stage renal disease: Secondary | ICD-10-CM | POA: Diagnosis not present

## 2015-05-30 DIAGNOSIS — D631 Anemia in chronic kidney disease: Secondary | ICD-10-CM | POA: Diagnosis not present

## 2015-05-30 DIAGNOSIS — Z992 Dependence on renal dialysis: Secondary | ICD-10-CM | POA: Diagnosis not present

## 2015-05-30 DIAGNOSIS — E1129 Type 2 diabetes mellitus with other diabetic kidney complication: Secondary | ICD-10-CM | POA: Diagnosis not present

## 2015-06-02 DIAGNOSIS — D631 Anemia in chronic kidney disease: Secondary | ICD-10-CM | POA: Diagnosis not present

## 2015-06-02 DIAGNOSIS — E1129 Type 2 diabetes mellitus with other diabetic kidney complication: Secondary | ICD-10-CM | POA: Diagnosis not present

## 2015-06-02 DIAGNOSIS — N186 End stage renal disease: Secondary | ICD-10-CM | POA: Diagnosis not present

## 2015-06-15 DIAGNOSIS — D49512 Neoplasm of unspecified behavior of left kidney: Secondary | ICD-10-CM | POA: Diagnosis not present

## 2015-06-15 DIAGNOSIS — R310 Gross hematuria: Secondary | ICD-10-CM | POA: Diagnosis not present

## 2015-06-15 DIAGNOSIS — D49511 Neoplasm of unspecified behavior of right kidney: Secondary | ICD-10-CM | POA: Diagnosis not present

## 2015-06-18 DIAGNOSIS — E1129 Type 2 diabetes mellitus with other diabetic kidney complication: Secondary | ICD-10-CM | POA: Diagnosis not present

## 2015-06-19 DIAGNOSIS — N186 End stage renal disease: Secondary | ICD-10-CM | POA: Diagnosis not present

## 2015-06-19 DIAGNOSIS — Z992 Dependence on renal dialysis: Secondary | ICD-10-CM | POA: Diagnosis not present

## 2015-06-19 DIAGNOSIS — C641 Malignant neoplasm of right kidney, except renal pelvis: Secondary | ICD-10-CM | POA: Diagnosis not present

## 2015-06-19 DIAGNOSIS — R310 Gross hematuria: Secondary | ICD-10-CM | POA: Diagnosis not present

## 2015-06-19 DIAGNOSIS — Z0000 Encounter for general adult medical examination without abnormal findings: Secondary | ICD-10-CM | POA: Diagnosis not present

## 2015-06-19 DIAGNOSIS — C642 Malignant neoplasm of left kidney, except renal pelvis: Secondary | ICD-10-CM | POA: Diagnosis not present

## 2015-06-30 DIAGNOSIS — E1129 Type 2 diabetes mellitus with other diabetic kidney complication: Secondary | ICD-10-CM | POA: Diagnosis not present

## 2015-06-30 DIAGNOSIS — Z992 Dependence on renal dialysis: Secondary | ICD-10-CM | POA: Diagnosis not present

## 2015-06-30 DIAGNOSIS — N186 End stage renal disease: Secondary | ICD-10-CM | POA: Diagnosis not present

## 2015-07-02 DIAGNOSIS — N186 End stage renal disease: Secondary | ICD-10-CM | POA: Diagnosis not present

## 2015-07-02 DIAGNOSIS — D631 Anemia in chronic kidney disease: Secondary | ICD-10-CM | POA: Diagnosis not present

## 2015-07-02 DIAGNOSIS — N2581 Secondary hyperparathyroidism of renal origin: Secondary | ICD-10-CM | POA: Diagnosis not present

## 2015-07-02 DIAGNOSIS — E1129 Type 2 diabetes mellitus with other diabetic kidney complication: Secondary | ICD-10-CM | POA: Diagnosis not present

## 2015-07-04 DIAGNOSIS — D631 Anemia in chronic kidney disease: Secondary | ICD-10-CM | POA: Diagnosis not present

## 2015-07-04 DIAGNOSIS — E1129 Type 2 diabetes mellitus with other diabetic kidney complication: Secondary | ICD-10-CM | POA: Diagnosis not present

## 2015-07-04 DIAGNOSIS — N186 End stage renal disease: Secondary | ICD-10-CM | POA: Diagnosis not present

## 2015-07-04 DIAGNOSIS — N2581 Secondary hyperparathyroidism of renal origin: Secondary | ICD-10-CM | POA: Diagnosis not present

## 2015-07-07 DIAGNOSIS — N2581 Secondary hyperparathyroidism of renal origin: Secondary | ICD-10-CM | POA: Diagnosis not present

## 2015-07-07 DIAGNOSIS — E1129 Type 2 diabetes mellitus with other diabetic kidney complication: Secondary | ICD-10-CM | POA: Diagnosis not present

## 2015-07-07 DIAGNOSIS — D631 Anemia in chronic kidney disease: Secondary | ICD-10-CM | POA: Diagnosis not present

## 2015-07-07 DIAGNOSIS — N186 End stage renal disease: Secondary | ICD-10-CM | POA: Diagnosis not present

## 2015-07-09 DIAGNOSIS — N186 End stage renal disease: Secondary | ICD-10-CM | POA: Diagnosis not present

## 2015-07-09 DIAGNOSIS — E1129 Type 2 diabetes mellitus with other diabetic kidney complication: Secondary | ICD-10-CM | POA: Diagnosis not present

## 2015-07-09 DIAGNOSIS — N2581 Secondary hyperparathyroidism of renal origin: Secondary | ICD-10-CM | POA: Diagnosis not present

## 2015-07-09 DIAGNOSIS — D631 Anemia in chronic kidney disease: Secondary | ICD-10-CM | POA: Diagnosis not present

## 2015-07-11 DIAGNOSIS — N2581 Secondary hyperparathyroidism of renal origin: Secondary | ICD-10-CM | POA: Diagnosis not present

## 2015-07-11 DIAGNOSIS — D631 Anemia in chronic kidney disease: Secondary | ICD-10-CM | POA: Diagnosis not present

## 2015-07-11 DIAGNOSIS — N186 End stage renal disease: Secondary | ICD-10-CM | POA: Diagnosis not present

## 2015-07-11 DIAGNOSIS — E1129 Type 2 diabetes mellitus with other diabetic kidney complication: Secondary | ICD-10-CM | POA: Diagnosis not present

## 2015-07-14 DIAGNOSIS — E1129 Type 2 diabetes mellitus with other diabetic kidney complication: Secondary | ICD-10-CM | POA: Diagnosis not present

## 2015-07-14 DIAGNOSIS — N2581 Secondary hyperparathyroidism of renal origin: Secondary | ICD-10-CM | POA: Diagnosis not present

## 2015-07-14 DIAGNOSIS — D631 Anemia in chronic kidney disease: Secondary | ICD-10-CM | POA: Diagnosis not present

## 2015-07-14 DIAGNOSIS — N186 End stage renal disease: Secondary | ICD-10-CM | POA: Diagnosis not present

## 2015-07-16 DIAGNOSIS — N2581 Secondary hyperparathyroidism of renal origin: Secondary | ICD-10-CM | POA: Diagnosis not present

## 2015-07-16 DIAGNOSIS — E1129 Type 2 diabetes mellitus with other diabetic kidney complication: Secondary | ICD-10-CM | POA: Diagnosis not present

## 2015-07-16 DIAGNOSIS — N186 End stage renal disease: Secondary | ICD-10-CM | POA: Diagnosis not present

## 2015-07-16 DIAGNOSIS — D631 Anemia in chronic kidney disease: Secondary | ICD-10-CM | POA: Diagnosis not present

## 2015-07-18 DIAGNOSIS — D631 Anemia in chronic kidney disease: Secondary | ICD-10-CM | POA: Diagnosis not present

## 2015-07-18 DIAGNOSIS — N2581 Secondary hyperparathyroidism of renal origin: Secondary | ICD-10-CM | POA: Diagnosis not present

## 2015-07-18 DIAGNOSIS — E1129 Type 2 diabetes mellitus with other diabetic kidney complication: Secondary | ICD-10-CM | POA: Diagnosis not present

## 2015-07-18 DIAGNOSIS — N186 End stage renal disease: Secondary | ICD-10-CM | POA: Diagnosis not present

## 2015-07-21 DIAGNOSIS — N186 End stage renal disease: Secondary | ICD-10-CM | POA: Diagnosis not present

## 2015-07-21 DIAGNOSIS — D631 Anemia in chronic kidney disease: Secondary | ICD-10-CM | POA: Diagnosis not present

## 2015-07-21 DIAGNOSIS — E1129 Type 2 diabetes mellitus with other diabetic kidney complication: Secondary | ICD-10-CM | POA: Diagnosis not present

## 2015-07-21 DIAGNOSIS — N2581 Secondary hyperparathyroidism of renal origin: Secondary | ICD-10-CM | POA: Diagnosis not present

## 2015-07-22 DIAGNOSIS — E119 Type 2 diabetes mellitus without complications: Secondary | ICD-10-CM | POA: Diagnosis not present

## 2015-07-22 DIAGNOSIS — M19012 Primary osteoarthritis, left shoulder: Secondary | ICD-10-CM | POA: Diagnosis not present

## 2015-07-22 DIAGNOSIS — G546 Phantom limb syndrome with pain: Secondary | ICD-10-CM | POA: Diagnosis not present

## 2015-07-22 DIAGNOSIS — Z89512 Acquired absence of left leg below knee: Secondary | ICD-10-CM | POA: Diagnosis not present

## 2015-07-23 DIAGNOSIS — E1129 Type 2 diabetes mellitus with other diabetic kidney complication: Secondary | ICD-10-CM | POA: Diagnosis not present

## 2015-07-23 DIAGNOSIS — D631 Anemia in chronic kidney disease: Secondary | ICD-10-CM | POA: Diagnosis not present

## 2015-07-23 DIAGNOSIS — N2581 Secondary hyperparathyroidism of renal origin: Secondary | ICD-10-CM | POA: Diagnosis not present

## 2015-07-23 DIAGNOSIS — N186 End stage renal disease: Secondary | ICD-10-CM | POA: Diagnosis not present

## 2015-07-25 DIAGNOSIS — N186 End stage renal disease: Secondary | ICD-10-CM | POA: Diagnosis not present

## 2015-07-25 DIAGNOSIS — E1129 Type 2 diabetes mellitus with other diabetic kidney complication: Secondary | ICD-10-CM | POA: Diagnosis not present

## 2015-07-25 DIAGNOSIS — N2581 Secondary hyperparathyroidism of renal origin: Secondary | ICD-10-CM | POA: Diagnosis not present

## 2015-07-25 DIAGNOSIS — D631 Anemia in chronic kidney disease: Secondary | ICD-10-CM | POA: Diagnosis not present

## 2015-07-28 DIAGNOSIS — N2581 Secondary hyperparathyroidism of renal origin: Secondary | ICD-10-CM | POA: Diagnosis not present

## 2015-07-28 DIAGNOSIS — Z992 Dependence on renal dialysis: Secondary | ICD-10-CM | POA: Diagnosis not present

## 2015-07-28 DIAGNOSIS — E1129 Type 2 diabetes mellitus with other diabetic kidney complication: Secondary | ICD-10-CM | POA: Diagnosis not present

## 2015-07-28 DIAGNOSIS — N186 End stage renal disease: Secondary | ICD-10-CM | POA: Diagnosis not present

## 2015-07-28 DIAGNOSIS — D631 Anemia in chronic kidney disease: Secondary | ICD-10-CM | POA: Diagnosis not present

## 2015-07-30 DIAGNOSIS — N186 End stage renal disease: Secondary | ICD-10-CM | POA: Diagnosis not present

## 2015-07-30 DIAGNOSIS — E1129 Type 2 diabetes mellitus with other diabetic kidney complication: Secondary | ICD-10-CM | POA: Diagnosis not present

## 2015-07-30 DIAGNOSIS — N2581 Secondary hyperparathyroidism of renal origin: Secondary | ICD-10-CM | POA: Diagnosis not present

## 2015-07-30 DIAGNOSIS — D631 Anemia in chronic kidney disease: Secondary | ICD-10-CM | POA: Diagnosis not present

## 2015-08-01 DIAGNOSIS — E1129 Type 2 diabetes mellitus with other diabetic kidney complication: Secondary | ICD-10-CM | POA: Diagnosis not present

## 2015-08-01 DIAGNOSIS — D631 Anemia in chronic kidney disease: Secondary | ICD-10-CM | POA: Diagnosis not present

## 2015-08-01 DIAGNOSIS — N2581 Secondary hyperparathyroidism of renal origin: Secondary | ICD-10-CM | POA: Diagnosis not present

## 2015-08-01 DIAGNOSIS — N186 End stage renal disease: Secondary | ICD-10-CM | POA: Diagnosis not present

## 2015-08-04 DIAGNOSIS — N186 End stage renal disease: Secondary | ICD-10-CM | POA: Diagnosis not present

## 2015-08-04 DIAGNOSIS — E1129 Type 2 diabetes mellitus with other diabetic kidney complication: Secondary | ICD-10-CM | POA: Diagnosis not present

## 2015-08-04 DIAGNOSIS — N2581 Secondary hyperparathyroidism of renal origin: Secondary | ICD-10-CM | POA: Diagnosis not present

## 2015-08-04 DIAGNOSIS — D631 Anemia in chronic kidney disease: Secondary | ICD-10-CM | POA: Diagnosis not present

## 2015-08-06 DIAGNOSIS — N186 End stage renal disease: Secondary | ICD-10-CM | POA: Diagnosis not present

## 2015-08-06 DIAGNOSIS — D631 Anemia in chronic kidney disease: Secondary | ICD-10-CM | POA: Diagnosis not present

## 2015-08-06 DIAGNOSIS — N2581 Secondary hyperparathyroidism of renal origin: Secondary | ICD-10-CM | POA: Diagnosis not present

## 2015-08-06 DIAGNOSIS — E1129 Type 2 diabetes mellitus with other diabetic kidney complication: Secondary | ICD-10-CM | POA: Diagnosis not present

## 2015-08-08 DIAGNOSIS — D631 Anemia in chronic kidney disease: Secondary | ICD-10-CM | POA: Diagnosis not present

## 2015-08-08 DIAGNOSIS — E1129 Type 2 diabetes mellitus with other diabetic kidney complication: Secondary | ICD-10-CM | POA: Diagnosis not present

## 2015-08-08 DIAGNOSIS — N186 End stage renal disease: Secondary | ICD-10-CM | POA: Diagnosis not present

## 2015-08-08 DIAGNOSIS — N2581 Secondary hyperparathyroidism of renal origin: Secondary | ICD-10-CM | POA: Diagnosis not present

## 2015-08-11 DIAGNOSIS — N2581 Secondary hyperparathyroidism of renal origin: Secondary | ICD-10-CM | POA: Diagnosis not present

## 2015-08-11 DIAGNOSIS — D631 Anemia in chronic kidney disease: Secondary | ICD-10-CM | POA: Diagnosis not present

## 2015-08-11 DIAGNOSIS — E1129 Type 2 diabetes mellitus with other diabetic kidney complication: Secondary | ICD-10-CM | POA: Diagnosis not present

## 2015-08-11 DIAGNOSIS — N186 End stage renal disease: Secondary | ICD-10-CM | POA: Diagnosis not present

## 2015-08-13 DIAGNOSIS — N186 End stage renal disease: Secondary | ICD-10-CM | POA: Diagnosis not present

## 2015-08-13 DIAGNOSIS — N2581 Secondary hyperparathyroidism of renal origin: Secondary | ICD-10-CM | POA: Diagnosis not present

## 2015-08-13 DIAGNOSIS — E1129 Type 2 diabetes mellitus with other diabetic kidney complication: Secondary | ICD-10-CM | POA: Diagnosis not present

## 2015-08-13 DIAGNOSIS — D631 Anemia in chronic kidney disease: Secondary | ICD-10-CM | POA: Diagnosis not present

## 2015-08-14 DIAGNOSIS — G546 Phantom limb syndrome with pain: Secondary | ICD-10-CM | POA: Diagnosis not present

## 2015-08-14 DIAGNOSIS — E119 Type 2 diabetes mellitus without complications: Secondary | ICD-10-CM | POA: Diagnosis not present

## 2015-08-14 DIAGNOSIS — M19012 Primary osteoarthritis, left shoulder: Secondary | ICD-10-CM | POA: Diagnosis not present

## 2015-08-14 DIAGNOSIS — Z89512 Acquired absence of left leg below knee: Secondary | ICD-10-CM | POA: Diagnosis not present

## 2015-08-15 DIAGNOSIS — N186 End stage renal disease: Secondary | ICD-10-CM | POA: Diagnosis not present

## 2015-08-15 DIAGNOSIS — D631 Anemia in chronic kidney disease: Secondary | ICD-10-CM | POA: Diagnosis not present

## 2015-08-15 DIAGNOSIS — E1129 Type 2 diabetes mellitus with other diabetic kidney complication: Secondary | ICD-10-CM | POA: Diagnosis not present

## 2015-08-15 DIAGNOSIS — N2581 Secondary hyperparathyroidism of renal origin: Secondary | ICD-10-CM | POA: Diagnosis not present

## 2015-08-18 DIAGNOSIS — N2581 Secondary hyperparathyroidism of renal origin: Secondary | ICD-10-CM | POA: Diagnosis not present

## 2015-08-18 DIAGNOSIS — E1129 Type 2 diabetes mellitus with other diabetic kidney complication: Secondary | ICD-10-CM | POA: Diagnosis not present

## 2015-08-18 DIAGNOSIS — D631 Anemia in chronic kidney disease: Secondary | ICD-10-CM | POA: Diagnosis not present

## 2015-08-18 DIAGNOSIS — N186 End stage renal disease: Secondary | ICD-10-CM | POA: Diagnosis not present

## 2015-08-20 DIAGNOSIS — E1129 Type 2 diabetes mellitus with other diabetic kidney complication: Secondary | ICD-10-CM | POA: Diagnosis not present

## 2015-08-20 DIAGNOSIS — N186 End stage renal disease: Secondary | ICD-10-CM | POA: Diagnosis not present

## 2015-08-20 DIAGNOSIS — N2581 Secondary hyperparathyroidism of renal origin: Secondary | ICD-10-CM | POA: Diagnosis not present

## 2015-08-20 DIAGNOSIS — D631 Anemia in chronic kidney disease: Secondary | ICD-10-CM | POA: Diagnosis not present

## 2015-08-22 ENCOUNTER — Emergency Department (HOSPITAL_COMMUNITY): Payer: Medicare Other

## 2015-08-22 ENCOUNTER — Emergency Department (HOSPITAL_COMMUNITY)
Admission: EM | Admit: 2015-08-22 | Discharge: 2015-08-22 | Disposition: A | Discharge status: Home / Self Care | Payer: Medicare Other | Attending: Emergency Medicine | Admitting: Emergency Medicine

## 2015-08-22 ENCOUNTER — Encounter (HOSPITAL_COMMUNITY): Payer: Self-pay | Admitting: Emergency Medicine

## 2015-08-22 DIAGNOSIS — E785 Hyperlipidemia, unspecified: Secondary | ICD-10-CM | POA: Diagnosis not present

## 2015-08-22 DIAGNOSIS — Z794 Long term (current) use of insulin: Secondary | ICD-10-CM | POA: Diagnosis not present

## 2015-08-22 DIAGNOSIS — Z87891 Personal history of nicotine dependence: Secondary | ICD-10-CM | POA: Insufficient documentation

## 2015-08-22 DIAGNOSIS — N186 End stage renal disease: Secondary | ICD-10-CM | POA: Diagnosis not present

## 2015-08-22 DIAGNOSIS — R05 Cough: Secondary | ICD-10-CM | POA: Diagnosis not present

## 2015-08-22 DIAGNOSIS — E1151 Type 2 diabetes mellitus with diabetic peripheral angiopathy without gangrene: Secondary | ICD-10-CM | POA: Insufficient documentation

## 2015-08-22 DIAGNOSIS — J209 Acute bronchitis, unspecified: Secondary | ICD-10-CM

## 2015-08-22 DIAGNOSIS — I120 Hypertensive chronic kidney disease with stage 5 chronic kidney disease or end stage renal disease: Secondary | ICD-10-CM | POA: Diagnosis not present

## 2015-08-22 DIAGNOSIS — Z992 Dependence on renal dialysis: Secondary | ICD-10-CM | POA: Insufficient documentation

## 2015-08-22 DIAGNOSIS — J449 Chronic obstructive pulmonary disease, unspecified: Secondary | ICD-10-CM | POA: Insufficient documentation

## 2015-08-22 DIAGNOSIS — B349 Viral infection, unspecified: Secondary | ICD-10-CM | POA: Diagnosis not present

## 2015-08-22 DIAGNOSIS — Z79899 Other long term (current) drug therapy: Secondary | ICD-10-CM | POA: Diagnosis not present

## 2015-08-22 DIAGNOSIS — E1122 Type 2 diabetes mellitus with diabetic chronic kidney disease: Secondary | ICD-10-CM | POA: Insufficient documentation

## 2015-08-22 DIAGNOSIS — R509 Fever, unspecified: Secondary | ICD-10-CM | POA: Diagnosis present

## 2015-08-22 DIAGNOSIS — D631 Anemia in chronic kidney disease: Secondary | ICD-10-CM | POA: Diagnosis not present

## 2015-08-22 DIAGNOSIS — N390 Urinary tract infection, site not specified: Secondary | ICD-10-CM | POA: Diagnosis not present

## 2015-08-22 DIAGNOSIS — E1129 Type 2 diabetes mellitus with other diabetic kidney complication: Secondary | ICD-10-CM | POA: Diagnosis not present

## 2015-08-22 DIAGNOSIS — N2581 Secondary hyperparathyroidism of renal origin: Secondary | ICD-10-CM | POA: Diagnosis not present

## 2015-08-22 DIAGNOSIS — E039 Hypothyroidism, unspecified: Secondary | ICD-10-CM | POA: Diagnosis not present

## 2015-08-22 DIAGNOSIS — R059 Cough, unspecified: Secondary | ICD-10-CM

## 2015-08-22 LAB — CBC WITH DIFFERENTIAL/PLATELET
BASOS ABS: 0 10*3/uL (ref 0.0–0.1)
BASOS PCT: 0 %
EOS ABS: 0 10*3/uL (ref 0.0–0.7)
Eosinophils Relative: 0 %
HEMATOCRIT: 36.6 % (ref 36.0–46.0)
HEMOGLOBIN: 10.9 g/dL — AB (ref 12.0–15.0)
Lymphocytes Relative: 21 %
Lymphs Abs: 1.6 10*3/uL (ref 0.7–4.0)
MCH: 26 pg (ref 26.0–34.0)
MCHC: 29.8 g/dL — AB (ref 30.0–36.0)
MCV: 87.1 fL (ref 78.0–100.0)
Monocytes Absolute: 1.2 10*3/uL — ABNORMAL HIGH (ref 0.1–1.0)
Monocytes Relative: 15 %
NEUTROS ABS: 5.1 10*3/uL (ref 1.7–7.7)
NEUTROS PCT: 64 %
Platelets: 167 10*3/uL (ref 150–400)
RBC: 4.2 MIL/uL (ref 3.87–5.11)
RDW: 17.9 % — ABNORMAL HIGH (ref 11.5–15.5)
WBC: 7.9 10*3/uL (ref 4.0–10.5)

## 2015-08-22 LAB — BASIC METABOLIC PANEL
ANION GAP: 19 — AB (ref 5–15)
BUN: 13 mg/dL (ref 6–20)
CO2: 24 mmol/L (ref 22–32)
CREATININE: 3.91 mg/dL — AB (ref 0.44–1.00)
Calcium: 7.8 mg/dL — ABNORMAL LOW (ref 8.9–10.3)
Chloride: 96 mmol/L — ABNORMAL LOW (ref 101–111)
GFR calc non Af Amer: 11 mL/min — ABNORMAL LOW (ref >60)
GFR, EST AFRICAN AMERICAN: 13 mL/min — AB (ref >60)
GLUCOSE: 101 mg/dL — AB (ref 65–99)
Potassium: 3.5 mmol/L (ref 3.5–5.1)
Sodium: 139 mmol/L (ref 135–145)

## 2015-08-22 LAB — ACETAMINOPHEN LEVEL: ACETAMINOPHEN (TYLENOL), SERUM: 19 ug/mL (ref 10–30)

## 2015-08-22 LAB — TROPONIN I: TROPONIN I: 0.04 ng/mL — AB (ref <0.031)

## 2015-08-22 LAB — I-STAT CG4 LACTIC ACID, ED: LACTIC ACID, VENOUS: 1.31 mmol/L (ref 0.5–2.0)

## 2015-08-22 MED ORDER — LEVOFLOXACIN 500 MG PO TABS
500.0000 mg | ORAL_TABLET | Freq: Once | ORAL | Status: AC
  Administered 2015-08-22: 500 mg via ORAL

## 2015-08-22 MED ORDER — ALBUTEROL SULFATE (2.5 MG/3ML) 0.083% IN NEBU
2.5000 mg | INHALATION_SOLUTION | RESPIRATORY_TRACT | Status: DC | PRN
  Administered 2015-08-22: 2.5 mg via RESPIRATORY_TRACT
  Filled 2015-08-22: qty 3

## 2015-08-22 MED ORDER — ACETAMINOPHEN 325 MG PO TABS
650.0000 mg | ORAL_TABLET | Freq: Once | ORAL | Status: AC | PRN
  Administered 2015-08-22: 650 mg via ORAL
  Filled 2015-08-22: qty 2

## 2015-08-22 MED ORDER — LEVOFLOXACIN 500 MG PO TABS: 500.0000 mg | ORAL_TABLET | Freq: Every day | ORAL | Status: DC

## 2015-08-22 MED ORDER — ACETAMINOPHEN 325 MG PO TABS: 650.0000 mg | ORAL_TABLET | Freq: Once | ORAL | Status: DC

## 2015-08-22 MED ORDER — SODIUM CHLORIDE 0.9 % IV BOLUS (SEPSIS)
1000.0000 mL | Freq: Once | INTRAVENOUS | Status: AC
  Administered 2015-08-22: 1000 mL via INTRAVENOUS

## 2015-08-22 MED ORDER — BENZONATATE 100 MG PO CAPS: 100.0000 mg | ORAL_CAPSULE | Freq: Three times a day (TID) | ORAL | Status: DC

## 2015-08-22 MED ORDER — LEVOFLOXACIN 750 MG PO TABS
750.0000 mg | ORAL_TABLET | Freq: Once | ORAL | Status: AC
  Administered 2015-08-22: 750 mg via ORAL
  Filled 2015-08-22: qty 1

## 2015-08-22 NOTE — Discharge Instructions (Signed)
Acute Bronchitis Bronchitis is inflammation of the airways that extend from the windpipe into the lungs (bronchi). The inflammation often causes mucus to develop. This leads to a cough, which is the most common symptom of bronchitis.  In acute bronchitis, the condition usually develops suddenly and goes away over time, usually in a couple weeks. Smoking, allergies, and asthma can make bronchitis worse. Repeated episodes of bronchitis may cause further lung problems.  CAUSES Acute bronchitis is most often caused by the same virus that causes a cold. The virus can spread from person to person (contagious) through coughing, sneezing, and touching contaminated objects. SIGNS AND SYMPTOMS   Cough.   Fever.   Coughing up mucus.   Body aches.   Chest congestion.   Chills.   Shortness of breath.   Sore throat.  DIAGNOSIS  Acute bronchitis is usually diagnosed through a physical exam. Your health care provider will also ask you questions about your medical history. Tests, such as chest X-rays, are sometimes done to rule out other conditions.  TREATMENT  Acute bronchitis usually goes away in a couple weeks. Oftentimes, no medical treatment is necessary. Medicines are sometimes given for relief of fever or cough. Antibiotic medicines are usually not needed but may be prescribed in certain situations. In some cases, an inhaler may be recommended to help reduce shortness of breath and control the cough. A cool mist vaporizer may also be used to help thin bronchial secretions and make it easier to clear the chest.  HOME CARE INSTRUCTIONS  Get plenty of rest.   Drink enough fluids to keep your urine clear or pale yellow (unless you have a medical condition that requires fluid restriction). Increasing fluids may help thin your respiratory secretions (sputum) and reduce chest congestion, and it will prevent dehydration.   Take medicines only as directed by your health care provider.  If  you were prescribed an antibiotic medicine, finish it all even if you start to feel better.  Avoid smoking and secondhand smoke. Exposure to cigarette smoke or irritating chemicals will make bronchitis worse. If you are a smoker, consider using nicotine gum or skin patches to help control withdrawal symptoms. Quitting smoking will help your lungs heal faster.   Reduce the chances of another bout of acute bronchitis by washing your hands frequently, avoiding people with cold symptoms, and trying not to touch your hands to your mouth, nose, or eyes.   Keep all follow-up visits as directed by your health care provider.  SEEK MEDICAL CARE IF: Your symptoms do not improve after 1 week of treatment.  SEEK IMMEDIATE MEDICAL CARE IF:  You develop an increased fever or chills.   You have chest pain.   You have severe shortness of breath.  You have bloody sputum.   You develop dehydration.  You faint or repeatedly feel like you are going to pass out.  You develop repeated vomiting.  You develop a severe headache. MAKE SURE YOU:   Understand these instructions.  Will watch your condition.  Will get help right away if you are not doing well or get worse.   This information is not intended to replace advice given to you by your health care provider. Make sure you discuss any questions you have with your health care provider.   Document Released: 06/23/2004 Document Revised: 06/06/2014 Document Reviewed: 11/06/2012 Elsevier Interactive Patient Education 2016 Elsevier Inc.  Cough, Adult A cough helps to clear your throat and lungs. A cough may last only 2-3 weeks (  acute), or it may last longer than 8 weeks (chronic). Many different things can cause a cough. A cough may be a sign of an illness or another medical condition. HOME CARE  Pay attention to any changes in your cough.  Take medicines only as told by your doctor.  If you were prescribed an antibiotic medicine, take it as  told by your doctor. Do not stop taking it even if you start to feel better.  Talk with your doctor before you try using a cough medicine.  Drink enough fluid to keep your pee (urine) clear or pale yellow.  If the air is dry, use a cold steam vaporizer or humidifier in your home.  Stay away from things that make you cough at work or at home.  If your cough is worse at night, try using extra pillows to raise your head up higher while you sleep.  Do not smoke, and try not to be around smoke. If you need help quitting, ask your doctor.  Do not have caffeine.  Do not drink alcohol.  Rest as needed. GET HELP IF:  You have new problems (symptoms).  You cough up yellow fluid (pus).  Your cough does not get better after 2-3 weeks, or your cough gets worse.  Medicine does not help your cough and you are not sleeping well.  You have pain that gets worse or pain that is not helped with medicine.  You have a fever.  You are losing weight and you do not know why.  You have night sweats. GET HELP RIGHT AWAY IF:  You cough up blood.  You have trouble breathing.  Your heartbeat is very fast.   This information is not intended to replace advice given to you by your health care provider. Make sure you discuss any questions you have with your health care provider.   Document Released: 01/27/2011 Document Revised: 02/04/2015 Document Reviewed: 07/23/2014 Elsevier Interactive Patient Education 2016 Elsevier Inc.  Urinary Tract Infection A urinary tract infection (UTI) can occur any place along the urinary tract. The tract includes the kidneys, ureters, bladder, and urethra. A type of germ called bacteria often causes a UTI. UTIs are often helped with antibiotic medicine.  HOME CARE   If given, take antibiotics as told by your doctor. Finish them even if you start to feel better.  Drink enough fluids to keep your pee (urine) clear or pale yellow.  Avoid tea, drinks with caffeine,  and bubbly (carbonated) drinks.  Pee often. Avoid holding your pee in for a long time.  Pee before and after having sex (intercourse).  Wipe from front to back after you poop (bowel movement) if you are a woman. Use each tissue only once. GET HELP RIGHT AWAY IF:   You have back pain.  You have lower belly (abdominal) pain.  You have chills.  You feel sick to your stomach (nauseous).  You throw up (vomit).  Your burning or discomfort with peeing does not go away.  You have a fever.  Your symptoms are not better in 3 days. MAKE SURE YOU:   Understand these instructions.  Will watch your condition.  Will get help right away if you are not doing well or get worse.   This information is not intended to replace advice given to you by your health care provider. Make sure you discuss any questions you have with your health care provider.   Document Released: 11/02/2007 Document Revised: 06/06/2014 Document Reviewed: 12/15/2011 Elsevier Interactive  Patient Education 2016 [employer].

## 2015-08-22 NOTE — ED Notes (Signed)
Pt is hard stick.  Attempting to obtain labs/iv access.

## 2015-08-22 NOTE — ED Provider Notes (Signed)
CSN: [id number]     Arrival date & time 08/22/15  1129 History   First MD Initiated Contact with Patient 08/22/15 1445     Chief Complaint  Patient presents with  . Generalized Body Aches     HPI  Impression presents for evaluation of weakness, body aches, fever and cough. Cough over the last several days. Feels like "I might have pneumonia". Productive of thick green and yellow sputum. Taking Mucinex. History of end-stage renal disease on 3 times weekly maintenance hemodialysis. States she was dizzy and lightheaded since her last dialysis session.  She did receive a flu vaccine this year.  Past Medical History  Diagnosis Date  . COPD (chronic obstructive pulmonary disease) ([place])   . Diabetes mellitus   . Hypertension   . Thyroid disease   . Hyperlipidemia   . Bronchitis   . Leg pain   . Refusal of blood transfusions as patient is Jehovah's Witness     patient is Air cabin crew witness  . Peripheral vascular disease ([place])   . Hypothyroidism   . Arthritis     knee  . Family history of anesthesia complication     SON- VOMITS  . Stroke ([place])   . ESRD on hemodialysis ([place])     [facility]. Started HD November 26, 2009. 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  Marland Kitchen Diarrhea     not constant- frequent   Past Surgical History  Procedure Laterality Date  . Cholecystectomy    . Dg av dialysis shunt access exist*r* or      working right HD catheter  . Av fistula placement  05/18/2010  . Femur im nail  05/20/2012    Procedure: INTRAMEDULLARY (IM) RETROGRADE FEMORAL NAILING;  Surgeon: Rozanna Box, MD;  Location: [place];  Service: Orthopedics;  Laterality: Left;  . Amputation Left 07/25/2012    Procedure: LEFT AMPUTATION BELOW KNEE;  Surgeon: Newt Minion, MD;  Location: [place];  Service: Orthopedics;  Laterality: Left;  Left Below Knee Amputation  . Bascilic vein transposition Right 11/23/2012    Procedure: RIGHT BASCILIC VEIN TRANSPOSITION ;  Surgeon: Angelia Mould, MD;  Location: [facility] OR;  Service:  Vascular;  Laterality: Right;   Family History  Problem Relation Age of Onset  . Diabetes Mother    Social History  Substance Use Topics  . Smoking status: Former Smoker -- 0.50 packs/day for 30 years    Types: Cigarettes    Quit date: 04/29/2012  . Smokeless tobacco: Never Used  . Alcohol Use: No   OB History    Gravida Para Term Preterm AB TAB SAB Ectopic Multiple Living            2     Review of Systems  Constitutional: Positive for fever, chills and fatigue. Negative for diaphoresis and appetite change.  HENT: Negative for mouth sores, sore throat and trouble swallowing.   Eyes: Negative for visual disturbance.  Respiratory: Positive for cough. Negative for chest tightness, shortness of breath and wheezing.   Cardiovascular: Negative for chest pain.  Gastrointestinal: Positive for nausea. Negative for vomiting, abdominal pain, diarrhea and abdominal distention.  Endocrine: Negative for polydipsia, polyphagia and polyuria.  Genitourinary: Negative for dysuria, frequency and hematuria.  Musculoskeletal: Negative for gait problem.  Skin: Negative for color change, pallor and rash.  Neurological: Negative for dizziness, syncope, light-headedness and headaches.  Hematological: Does not bruise/bleed easily.  Psychiatric/Behavioral: Negative for behavioral problems and confusion.      Allergies  Contrast media  and Latex  Home Medications   Prior to Admission medications   Medication Sig Start Date End Date Taking? Authorizing Provider  acetaminophen (TYLENOL) 650 MG CR tablet Take 1,300 mg by mouth every 8 (eight) hours as needed for pain.   Yes Historical Provider, MD  amLODipine (NORVASC) 5 MG tablet Take 1 tablet (5 mg total) by mouth daily. 06/04/12  Yes Ripudeep Krystal Eaton, MD  calcium acetate (PHOSLO) 667 MG capsule Take 1 capsule (667 mg total) by mouth 2 (two) times daily with a meal. 06/04/12  Yes Ripudeep K [name], MD  HYDROcodone-acetaminophen (NORCO/VICODIN) 5-325 MG per  tablet Take 1 tablet by mouth every 6 (six) hours as needed for moderate pain. 05/20/14  Yes Carole Civil, MD  levothyroxine (SYNTHROID, LEVOTHROID) 75 MCG tablet Take 75 mcg by mouth daily.     Yes Historical Provider, MD  metoCLOPramide (REGLAN) 5 MG tablet Take 1-2 tablets (5-10 mg total) by mouth every 8 (eight) hours as needed (if ondansetron (ZOFRAN) ineffective.). 06/01/12  Yes Ripudeep Krystal Eaton, MD  multivitamin (RENA-VIT) TABS tablet Take 1 tablet by mouth daily.   Yes Historical Provider, MD  niacin (NIASPAN) 500 MG CR tablet Take 500 mg by mouth daily.    Yes Historical Provider, MD  ondansetron (ZOFRAN) 4 MG tablet Take 1 tablet (4 mg total) by mouth every 6 (six) hours as needed for nausea. 06/01/12  Yes Ripudeep Krystal Eaton, MD  oxyCODONE (ROXICODONE) 5 MG immediate release tablet Take 1 tablet (5 mg total) by mouth every 6 (six) hours as needed for pain. 11/23/12  Yes Samantha J Rhyne, PA-C  benzonatate (TESSALON) 100 MG capsule Take 1 capsule (100 mg total) by mouth every 8 (eight) hours. 08/22/15   Tanna Furry, MD  insulin aspart (NOVOLOG) 100 UNIT/ML injection Inject 0-15 Units into the skin every 4 (four) hours. CBG < 70: Drink juice; CBG 70 - 120: 0 units: CBG 121 - 150: 2 units; CBG 151 - 200: 3 units; CBG 201 - 250: 5 units; CBG 251 - 300: 8 units;CBG 301 - 350: 11 units; CBG 351 - 400: 15 units; CBG > 400 : 15 units and Call MD Patient taking differently: Inject 0-15 Units into the skin every 4 (four) hours as needed for high blood sugar. CBG < 70: Drink juice; CBG 70 - 120: 0 units: CBG 121 - 150: 2 units; CBG 151 - 200: 3 units; CBG 201 - 250: 5 units; CBG 251 - 300: 8 units;CBG 301 - 350: 11 units; CBG 351 - 400: 15 units; CBG > 400 : 15 units and Call MD 06/01/12   Ripudeep Krystal Eaton, MD  levofloxacin (LEVAQUIN) 500 MG tablet Take 1 tablet (500 mg total) by mouth daily. 08/22/15   Tanna Furry, MD   BP 112/68 mmHg  Pulse 66  Temp(Src) 98.4 F (36.9 C) (Oral)  Resp 18  Wt 190 lb (86.183 kg)   SpO2 100% Physical Exam  Constitutional: [name] oriented to person, place, and time. She appears well-developed and well-nourished. No distress.  HENT:  Head: Normocephalic.  Eyes: Conjunctivae are normal. Pupils are equal, round, and reactive to light. No scleral icterus.  Neck: Normal range of motion. Neck supple. No thyromegaly present.  Cardiovascular: Normal rate and regular rhythm.  Exam reveals no gallop and no friction rub.   No murmur heard. Pulmonary/Chest: Effort normal and breath sounds normal. No respiratory distress. She has no wheezes. She has no rales.  Few scattered rhonchi. No focal diminished  breath sounds.  Abdominal: Soft. Bowel sounds are normal. She exhibits no distension. There is no tenderness. There is no rebound.  Musculoskeletal: Normal range of motion.  Neurological: [name] alert and oriented to person, place, and time.  Skin: Skin is warm and dry. No rash noted.  Psychiatric: She has a normal mood and affect. Her behavior is normal.    ED Course  Procedures (including critical care time) Labs Review Labs Reviewed  CBC WITH DIFFERENTIAL/PLATELET - Abnormal; Notable for the following:    Hemoglobin 10.9 (*)    MCHC 29.8 (*)    RDW 17.9 (*)    Monocytes Absolute 1.2 (*)    All other components within normal limits  TROPONIN I - Abnormal; Notable for the following:    Troponin I 0.04 (*)    All other components within normal limits  BASIC METABOLIC PANEL - Abnormal; Notable for the following:    Chloride 96 (*)    Glucose, Bld 101 (*)    Creatinine, Ser 3.91 (*)    Calcium 7.8 (*)    GFR calc non Af Amer 11 (*)    GFR calc Af Amer 13 (*)    Anion gap 19 (*)    All other components within normal limits  URINE CULTURE  ACETAMINOPHEN LEVEL  I-STAT CG4 LACTIC ACID, ED    Imaging Review Dg Chest 1 View  08/22/2015  CLINICAL DATA:  Cough, weakness. EXAM: CHEST 1 VIEW COMPARISON:  05/24/2015 FINDINGS: Normal cardiac silhouette. Pulmonary arteries are  prominent. No evidence of effusion, infiltrate, or pneumothorax. No acute osseous abnormality. IMPRESSION: No acute cardiopulmonary process. Electronically Signed   By: Suzy Bouchard M.D.   On: 08/22/2015 13:20   I have personally reviewed and evaluated these images and lab results as part of my medical decision-making.   EKG Interpretation None      MDM   Final diagnoses:  Viral syndrome  Acute bronchitis, unspecified organism  UTI (lower urinary tract infection)    Patient IV placed. Given 500 mL of fluid. His x-ray does not show infiltrate or effusion. Troponin "0.04. Not concerned this for presents myocardial ischemia. Creatinine at baseline potassium not elevated. Chest x-ray does not show infiltrate, does not show fluid. Normal lactic acid. Cath urine shows" possible". I am not able to be obtained on catheterized urine. Plan will be Levaquin. Home, primary care follow-up.    Tanna Furry, MD 08/22/15 2233

## 2015-08-22 NOTE — ED Notes (Signed)
Attempted in and out cath.  Very small amount of thick foul smelling urine.  Not enough to send for analysis.  Dr. Jeneen Rinks aware.

## 2015-08-22 NOTE — ED Notes (Signed)
Patient c/o generalized body aches with fevers and cough. Per patient productive cough with thick green sputum. Per patient taking Mucinex with no improvement. Denies any nausea, vomiting, or diarrhea.

## 2015-08-24 DIAGNOSIS — E1342 Other specified diabetes mellitus with diabetic polyneuropathy: Secondary | ICD-10-CM | POA: Diagnosis not present

## 2015-08-24 DIAGNOSIS — N186 End stage renal disease: Secondary | ICD-10-CM | POA: Diagnosis not present

## 2015-08-24 DIAGNOSIS — I10 Essential (primary) hypertension: Secondary | ICD-10-CM | POA: Diagnosis not present

## 2015-08-24 DIAGNOSIS — M5489 Other dorsalgia: Secondary | ICD-10-CM | POA: Diagnosis not present

## 2015-08-25 DIAGNOSIS — D631 Anemia in chronic kidney disease: Secondary | ICD-10-CM | POA: Diagnosis not present

## 2015-08-25 DIAGNOSIS — N186 End stage renal disease: Secondary | ICD-10-CM | POA: Diagnosis not present

## 2015-08-25 DIAGNOSIS — N2581 Secondary hyperparathyroidism of renal origin: Secondary | ICD-10-CM | POA: Diagnosis not present

## 2015-08-25 DIAGNOSIS — E1129 Type 2 diabetes mellitus with other diabetic kidney complication: Secondary | ICD-10-CM | POA: Diagnosis not present

## 2015-08-27 DIAGNOSIS — N186 End stage renal disease: Secondary | ICD-10-CM | POA: Diagnosis not present

## 2015-08-27 DIAGNOSIS — D631 Anemia in chronic kidney disease: Secondary | ICD-10-CM | POA: Diagnosis not present

## 2015-08-27 DIAGNOSIS — E1129 Type 2 diabetes mellitus with other diabetic kidney complication: Secondary | ICD-10-CM | POA: Diagnosis not present

## 2015-08-27 DIAGNOSIS — N2581 Secondary hyperparathyroidism of renal origin: Secondary | ICD-10-CM | POA: Diagnosis not present

## 2015-08-28 DIAGNOSIS — N186 End stage renal disease: Secondary | ICD-10-CM | POA: Diagnosis not present

## 2015-08-28 DIAGNOSIS — E1129 Type 2 diabetes mellitus with other diabetic kidney complication: Secondary | ICD-10-CM | POA: Diagnosis not present

## 2015-08-28 DIAGNOSIS — Z992 Dependence on renal dialysis: Secondary | ICD-10-CM | POA: Diagnosis not present

## 2015-08-29 DIAGNOSIS — D631 Anemia in chronic kidney disease: Secondary | ICD-10-CM | POA: Diagnosis not present

## 2015-08-29 DIAGNOSIS — N186 End stage renal disease: Secondary | ICD-10-CM | POA: Diagnosis not present

## 2015-08-29 DIAGNOSIS — E1129 Type 2 diabetes mellitus with other diabetic kidney complication: Secondary | ICD-10-CM | POA: Diagnosis not present

## 2015-08-29 DIAGNOSIS — N2581 Secondary hyperparathyroidism of renal origin: Secondary | ICD-10-CM | POA: Diagnosis not present

## 2015-09-01 DIAGNOSIS — N186 End stage renal disease: Secondary | ICD-10-CM | POA: Diagnosis not present

## 2015-09-01 DIAGNOSIS — N2581 Secondary hyperparathyroidism of renal origin: Secondary | ICD-10-CM | POA: Diagnosis not present

## 2015-09-01 DIAGNOSIS — D631 Anemia in chronic kidney disease: Secondary | ICD-10-CM | POA: Diagnosis not present

## 2015-09-01 DIAGNOSIS — E1129 Type 2 diabetes mellitus with other diabetic kidney complication: Secondary | ICD-10-CM | POA: Diagnosis not present

## 2015-09-02 DIAGNOSIS — I871 Compression of vein: Secondary | ICD-10-CM | POA: Diagnosis not present

## 2015-09-02 DIAGNOSIS — T82858D Stenosis of vascular prosthetic devices, implants and grafts, subsequent encounter: Secondary | ICD-10-CM | POA: Diagnosis not present

## 2015-09-02 DIAGNOSIS — Z992 Dependence on renal dialysis: Secondary | ICD-10-CM | POA: Diagnosis not present

## 2015-09-02 DIAGNOSIS — N186 End stage renal disease: Secondary | ICD-10-CM | POA: Diagnosis not present

## 2015-09-03 DIAGNOSIS — N2581 Secondary hyperparathyroidism of renal origin: Secondary | ICD-10-CM | POA: Diagnosis not present

## 2015-09-03 DIAGNOSIS — E1129 Type 2 diabetes mellitus with other diabetic kidney complication: Secondary | ICD-10-CM | POA: Diagnosis not present

## 2015-09-03 DIAGNOSIS — N186 End stage renal disease: Secondary | ICD-10-CM | POA: Diagnosis not present

## 2015-09-03 DIAGNOSIS — D631 Anemia in chronic kidney disease: Secondary | ICD-10-CM | POA: Diagnosis not present

## 2015-09-05 DIAGNOSIS — D631 Anemia in chronic kidney disease: Secondary | ICD-10-CM | POA: Diagnosis not present

## 2015-09-05 DIAGNOSIS — N2581 Secondary hyperparathyroidism of renal origin: Secondary | ICD-10-CM | POA: Diagnosis not present

## 2015-09-05 DIAGNOSIS — N186 End stage renal disease: Secondary | ICD-10-CM | POA: Diagnosis not present

## 2015-09-05 DIAGNOSIS — E1129 Type 2 diabetes mellitus with other diabetic kidney complication: Secondary | ICD-10-CM | POA: Diagnosis not present

## 2015-09-08 DIAGNOSIS — E1129 Type 2 diabetes mellitus with other diabetic kidney complication: Secondary | ICD-10-CM | POA: Diagnosis not present

## 2015-09-08 DIAGNOSIS — N2581 Secondary hyperparathyroidism of renal origin: Secondary | ICD-10-CM | POA: Diagnosis not present

## 2015-09-08 DIAGNOSIS — N186 End stage renal disease: Secondary | ICD-10-CM | POA: Diagnosis not present

## 2015-09-08 DIAGNOSIS — D631 Anemia in chronic kidney disease: Secondary | ICD-10-CM | POA: Diagnosis not present

## 2015-09-10 DIAGNOSIS — E1129 Type 2 diabetes mellitus with other diabetic kidney complication: Secondary | ICD-10-CM | POA: Diagnosis not present

## 2015-09-10 DIAGNOSIS — N2581 Secondary hyperparathyroidism of renal origin: Secondary | ICD-10-CM | POA: Diagnosis not present

## 2015-09-10 DIAGNOSIS — D631 Anemia in chronic kidney disease: Secondary | ICD-10-CM | POA: Diagnosis not present

## 2015-09-10 DIAGNOSIS — N186 End stage renal disease: Secondary | ICD-10-CM | POA: Diagnosis not present

## 2015-09-12 DIAGNOSIS — D631 Anemia in chronic kidney disease: Secondary | ICD-10-CM | POA: Diagnosis not present

## 2015-09-12 DIAGNOSIS — N2581 Secondary hyperparathyroidism of renal origin: Secondary | ICD-10-CM | POA: Diagnosis not present

## 2015-09-12 DIAGNOSIS — E1129 Type 2 diabetes mellitus with other diabetic kidney complication: Secondary | ICD-10-CM | POA: Diagnosis not present

## 2015-09-12 DIAGNOSIS — N186 End stage renal disease: Secondary | ICD-10-CM | POA: Diagnosis not present

## 2015-09-15 ENCOUNTER — Telehealth: Payer: Self-pay | Admitting: Internal Medicine

## 2015-09-15 DIAGNOSIS — N186 End stage renal disease: Secondary | ICD-10-CM | POA: Diagnosis not present

## 2015-09-15 DIAGNOSIS — D631 Anemia in chronic kidney disease: Secondary | ICD-10-CM | POA: Diagnosis not present

## 2015-09-15 DIAGNOSIS — N2581 Secondary hyperparathyroidism of renal origin: Secondary | ICD-10-CM | POA: Diagnosis not present

## 2015-09-15 DIAGNOSIS — E1129 Type 2 diabetes mellitus with other diabetic kidney complication: Secondary | ICD-10-CM | POA: Diagnosis not present

## 2015-09-15 NOTE — Telephone Encounter (Signed)
MAY RECALL FOR TCS

## 2015-09-17 DIAGNOSIS — N186 End stage renal disease: Secondary | ICD-10-CM | POA: Diagnosis not present

## 2015-09-17 DIAGNOSIS — D631 Anemia in chronic kidney disease: Secondary | ICD-10-CM | POA: Diagnosis not present

## 2015-09-17 DIAGNOSIS — N2581 Secondary hyperparathyroidism of renal origin: Secondary | ICD-10-CM | POA: Diagnosis not present

## 2015-09-17 DIAGNOSIS — E1129 Type 2 diabetes mellitus with other diabetic kidney complication: Secondary | ICD-10-CM | POA: Diagnosis not present

## 2015-09-19 DIAGNOSIS — N186 End stage renal disease: Secondary | ICD-10-CM | POA: Diagnosis not present

## 2015-09-19 DIAGNOSIS — E1129 Type 2 diabetes mellitus with other diabetic kidney complication: Secondary | ICD-10-CM | POA: Diagnosis not present

## 2015-09-19 DIAGNOSIS — D631 Anemia in chronic kidney disease: Secondary | ICD-10-CM | POA: Diagnosis not present

## 2015-09-19 DIAGNOSIS — N2581 Secondary hyperparathyroidism of renal origin: Secondary | ICD-10-CM | POA: Diagnosis not present

## 2015-09-21 NOTE — Telephone Encounter (Signed)
Letter mailed

## 2015-09-22 DIAGNOSIS — N2581 Secondary hyperparathyroidism of renal origin: Secondary | ICD-10-CM | POA: Diagnosis not present

## 2015-09-22 DIAGNOSIS — E1129 Type 2 diabetes mellitus with other diabetic kidney complication: Secondary | ICD-10-CM | POA: Diagnosis not present

## 2015-09-22 DIAGNOSIS — N186 End stage renal disease: Secondary | ICD-10-CM | POA: Diagnosis not present

## 2015-09-22 DIAGNOSIS — D631 Anemia in chronic kidney disease: Secondary | ICD-10-CM | POA: Diagnosis not present

## 2015-09-24 DIAGNOSIS — E1129 Type 2 diabetes mellitus with other diabetic kidney complication: Secondary | ICD-10-CM | POA: Diagnosis not present

## 2015-09-24 DIAGNOSIS — N186 End stage renal disease: Secondary | ICD-10-CM | POA: Diagnosis not present

## 2015-09-24 DIAGNOSIS — N2581 Secondary hyperparathyroidism of renal origin: Secondary | ICD-10-CM | POA: Diagnosis not present

## 2015-09-24 DIAGNOSIS — D631 Anemia in chronic kidney disease: Secondary | ICD-10-CM | POA: Diagnosis not present

## 2015-09-26 DIAGNOSIS — D631 Anemia in chronic kidney disease: Secondary | ICD-10-CM | POA: Diagnosis not present

## 2015-09-26 DIAGNOSIS — N186 End stage renal disease: Secondary | ICD-10-CM | POA: Diagnosis not present

## 2015-09-26 DIAGNOSIS — N2581 Secondary hyperparathyroidism of renal origin: Secondary | ICD-10-CM | POA: Diagnosis not present

## 2015-09-26 DIAGNOSIS — E1129 Type 2 diabetes mellitus with other diabetic kidney complication: Secondary | ICD-10-CM | POA: Diagnosis not present

## 2015-09-27 DIAGNOSIS — Z992 Dependence on renal dialysis: Secondary | ICD-10-CM | POA: Diagnosis not present

## 2015-09-27 DIAGNOSIS — N186 End stage renal disease: Secondary | ICD-10-CM | POA: Diagnosis not present

## 2015-09-27 DIAGNOSIS — E1129 Type 2 diabetes mellitus with other diabetic kidney complication: Secondary | ICD-10-CM | POA: Diagnosis not present

## 2015-09-29 DIAGNOSIS — N186 End stage renal disease: Secondary | ICD-10-CM | POA: Diagnosis not present

## 2015-09-29 DIAGNOSIS — N2581 Secondary hyperparathyroidism of renal origin: Secondary | ICD-10-CM | POA: Diagnosis not present

## 2015-09-29 DIAGNOSIS — D631 Anemia in chronic kidney disease: Secondary | ICD-10-CM | POA: Diagnosis not present

## 2015-09-29 DIAGNOSIS — E1129 Type 2 diabetes mellitus with other diabetic kidney complication: Secondary | ICD-10-CM | POA: Diagnosis not present

## 2015-10-01 DIAGNOSIS — N2581 Secondary hyperparathyroidism of renal origin: Secondary | ICD-10-CM | POA: Diagnosis not present

## 2015-10-01 DIAGNOSIS — N186 End stage renal disease: Secondary | ICD-10-CM | POA: Diagnosis not present

## 2015-10-01 DIAGNOSIS — D631 Anemia in chronic kidney disease: Secondary | ICD-10-CM | POA: Diagnosis not present

## 2015-10-01 DIAGNOSIS — E1129 Type 2 diabetes mellitus with other diabetic kidney complication: Secondary | ICD-10-CM | POA: Diagnosis not present

## 2015-10-03 DIAGNOSIS — E1129 Type 2 diabetes mellitus with other diabetic kidney complication: Secondary | ICD-10-CM | POA: Diagnosis not present

## 2015-10-03 DIAGNOSIS — D631 Anemia in chronic kidney disease: Secondary | ICD-10-CM | POA: Diagnosis not present

## 2015-10-03 DIAGNOSIS — N186 End stage renal disease: Secondary | ICD-10-CM | POA: Diagnosis not present

## 2015-10-03 DIAGNOSIS — N2581 Secondary hyperparathyroidism of renal origin: Secondary | ICD-10-CM | POA: Diagnosis not present

## 2015-10-06 DIAGNOSIS — E1129 Type 2 diabetes mellitus with other diabetic kidney complication: Secondary | ICD-10-CM | POA: Diagnosis not present

## 2015-10-06 DIAGNOSIS — D631 Anemia in chronic kidney disease: Secondary | ICD-10-CM | POA: Diagnosis not present

## 2015-10-06 DIAGNOSIS — N186 End stage renal disease: Secondary | ICD-10-CM | POA: Diagnosis not present

## 2015-10-06 DIAGNOSIS — N2581 Secondary hyperparathyroidism of renal origin: Secondary | ICD-10-CM | POA: Diagnosis not present

## 2015-10-08 DIAGNOSIS — N186 End stage renal disease: Secondary | ICD-10-CM | POA: Diagnosis not present

## 2015-10-08 DIAGNOSIS — N2581 Secondary hyperparathyroidism of renal origin: Secondary | ICD-10-CM | POA: Diagnosis not present

## 2015-10-08 DIAGNOSIS — D631 Anemia in chronic kidney disease: Secondary | ICD-10-CM | POA: Diagnosis not present

## 2015-10-08 DIAGNOSIS — E1129 Type 2 diabetes mellitus with other diabetic kidney complication: Secondary | ICD-10-CM | POA: Diagnosis not present

## 2015-10-10 DIAGNOSIS — N186 End stage renal disease: Secondary | ICD-10-CM | POA: Diagnosis not present

## 2015-10-10 DIAGNOSIS — D631 Anemia in chronic kidney disease: Secondary | ICD-10-CM | POA: Diagnosis not present

## 2015-10-10 DIAGNOSIS — N2581 Secondary hyperparathyroidism of renal origin: Secondary | ICD-10-CM | POA: Diagnosis not present

## 2015-10-10 DIAGNOSIS — E1129 Type 2 diabetes mellitus with other diabetic kidney complication: Secondary | ICD-10-CM | POA: Diagnosis not present

## 2015-10-13 DIAGNOSIS — N186 End stage renal disease: Secondary | ICD-10-CM | POA: Diagnosis not present

## 2015-10-13 DIAGNOSIS — E1129 Type 2 diabetes mellitus with other diabetic kidney complication: Secondary | ICD-10-CM | POA: Diagnosis not present

## 2015-10-13 DIAGNOSIS — N2581 Secondary hyperparathyroidism of renal origin: Secondary | ICD-10-CM | POA: Diagnosis not present

## 2015-10-13 DIAGNOSIS — D631 Anemia in chronic kidney disease: Secondary | ICD-10-CM | POA: Diagnosis not present

## 2015-10-15 DIAGNOSIS — N2581 Secondary hyperparathyroidism of renal origin: Secondary | ICD-10-CM | POA: Diagnosis not present

## 2015-10-15 DIAGNOSIS — D631 Anemia in chronic kidney disease: Secondary | ICD-10-CM | POA: Diagnosis not present

## 2015-10-15 DIAGNOSIS — N186 End stage renal disease: Secondary | ICD-10-CM | POA: Diagnosis not present

## 2015-10-15 DIAGNOSIS — E1129 Type 2 diabetes mellitus with other diabetic kidney complication: Secondary | ICD-10-CM | POA: Diagnosis not present

## 2015-10-17 DIAGNOSIS — D631 Anemia in chronic kidney disease: Secondary | ICD-10-CM | POA: Diagnosis not present

## 2015-10-17 DIAGNOSIS — N2581 Secondary hyperparathyroidism of renal origin: Secondary | ICD-10-CM | POA: Diagnosis not present

## 2015-10-17 DIAGNOSIS — E1129 Type 2 diabetes mellitus with other diabetic kidney complication: Secondary | ICD-10-CM | POA: Diagnosis not present

## 2015-10-17 DIAGNOSIS — N186 End stage renal disease: Secondary | ICD-10-CM | POA: Diagnosis not present

## 2015-10-20 DIAGNOSIS — E1129 Type 2 diabetes mellitus with other diabetic kidney complication: Secondary | ICD-10-CM | POA: Diagnosis not present

## 2015-10-20 DIAGNOSIS — N186 End stage renal disease: Secondary | ICD-10-CM | POA: Diagnosis not present

## 2015-10-20 DIAGNOSIS — D631 Anemia in chronic kidney disease: Secondary | ICD-10-CM | POA: Diagnosis not present

## 2015-10-20 DIAGNOSIS — N2581 Secondary hyperparathyroidism of renal origin: Secondary | ICD-10-CM | POA: Diagnosis not present

## 2015-10-22 DIAGNOSIS — N2581 Secondary hyperparathyroidism of renal origin: Secondary | ICD-10-CM | POA: Diagnosis not present

## 2015-10-22 DIAGNOSIS — E1129 Type 2 diabetes mellitus with other diabetic kidney complication: Secondary | ICD-10-CM | POA: Diagnosis not present

## 2015-10-22 DIAGNOSIS — N186 End stage renal disease: Secondary | ICD-10-CM | POA: Diagnosis not present

## 2015-10-22 DIAGNOSIS — D631 Anemia in chronic kidney disease: Secondary | ICD-10-CM | POA: Diagnosis not present

## 2015-10-24 DIAGNOSIS — D631 Anemia in chronic kidney disease: Secondary | ICD-10-CM | POA: Diagnosis not present

## 2015-10-24 DIAGNOSIS — N2581 Secondary hyperparathyroidism of renal origin: Secondary | ICD-10-CM | POA: Diagnosis not present

## 2015-10-24 DIAGNOSIS — N186 End stage renal disease: Secondary | ICD-10-CM | POA: Diagnosis not present

## 2015-10-24 DIAGNOSIS — E1129 Type 2 diabetes mellitus with other diabetic kidney complication: Secondary | ICD-10-CM | POA: Diagnosis not present

## 2015-10-27 DIAGNOSIS — N2581 Secondary hyperparathyroidism of renal origin: Secondary | ICD-10-CM | POA: Diagnosis not present

## 2015-10-27 DIAGNOSIS — N186 End stage renal disease: Secondary | ICD-10-CM | POA: Diagnosis not present

## 2015-10-27 DIAGNOSIS — D631 Anemia in chronic kidney disease: Secondary | ICD-10-CM | POA: Diagnosis not present

## 2015-10-27 DIAGNOSIS — E1129 Type 2 diabetes mellitus with other diabetic kidney complication: Secondary | ICD-10-CM | POA: Diagnosis not present

## 2015-10-28 DIAGNOSIS — N186 End stage renal disease: Secondary | ICD-10-CM | POA: Diagnosis not present

## 2015-10-28 DIAGNOSIS — Z992 Dependence on renal dialysis: Secondary | ICD-10-CM | POA: Diagnosis not present

## 2015-10-28 DIAGNOSIS — E1129 Type 2 diabetes mellitus with other diabetic kidney complication: Secondary | ICD-10-CM | POA: Diagnosis not present

## 2015-10-29 DIAGNOSIS — D631 Anemia in chronic kidney disease: Secondary | ICD-10-CM | POA: Diagnosis not present

## 2015-10-29 DIAGNOSIS — N2581 Secondary hyperparathyroidism of renal origin: Secondary | ICD-10-CM | POA: Diagnosis not present

## 2015-10-29 DIAGNOSIS — N186 End stage renal disease: Secondary | ICD-10-CM | POA: Diagnosis not present

## 2015-10-29 DIAGNOSIS — E1129 Type 2 diabetes mellitus with other diabetic kidney complication: Secondary | ICD-10-CM | POA: Diagnosis not present

## 2015-11-23 DIAGNOSIS — I10 Essential (primary) hypertension: Secondary | ICD-10-CM | POA: Diagnosis not present

## 2015-11-23 DIAGNOSIS — N186 End stage renal disease: Secondary | ICD-10-CM | POA: Diagnosis not present

## 2015-11-23 DIAGNOSIS — E1342 Other specified diabetes mellitus with diabetic polyneuropathy: Secondary | ICD-10-CM | POA: Diagnosis not present

## 2015-11-23 DIAGNOSIS — Z89612 Acquired absence of left leg above knee: Secondary | ICD-10-CM | POA: Diagnosis not present

## 2015-11-27 DIAGNOSIS — E1129 Type 2 diabetes mellitus with other diabetic kidney complication: Secondary | ICD-10-CM | POA: Diagnosis not present

## 2015-11-27 DIAGNOSIS — N186 End stage renal disease: Secondary | ICD-10-CM | POA: Diagnosis not present

## 2015-11-27 DIAGNOSIS — Z992 Dependence on renal dialysis: Secondary | ICD-10-CM | POA: Diagnosis not present

## 2015-11-28 DIAGNOSIS — N2581 Secondary hyperparathyroidism of renal origin: Secondary | ICD-10-CM | POA: Diagnosis not present

## 2015-11-28 DIAGNOSIS — E1129 Type 2 diabetes mellitus with other diabetic kidney complication: Secondary | ICD-10-CM | POA: Diagnosis not present

## 2015-11-28 DIAGNOSIS — N186 End stage renal disease: Secondary | ICD-10-CM | POA: Diagnosis not present

## 2015-11-28 DIAGNOSIS — D631 Anemia in chronic kidney disease: Secondary | ICD-10-CM | POA: Diagnosis not present

## 2015-12-01 DIAGNOSIS — D631 Anemia in chronic kidney disease: Secondary | ICD-10-CM | POA: Diagnosis not present

## 2015-12-01 DIAGNOSIS — N2581 Secondary hyperparathyroidism of renal origin: Secondary | ICD-10-CM | POA: Diagnosis not present

## 2015-12-01 DIAGNOSIS — E1129 Type 2 diabetes mellitus with other diabetic kidney complication: Secondary | ICD-10-CM | POA: Diagnosis not present

## 2015-12-01 DIAGNOSIS — N186 End stage renal disease: Secondary | ICD-10-CM | POA: Diagnosis not present

## 2015-12-03 DIAGNOSIS — N186 End stage renal disease: Secondary | ICD-10-CM | POA: Diagnosis not present

## 2015-12-03 DIAGNOSIS — E1129 Type 2 diabetes mellitus with other diabetic kidney complication: Secondary | ICD-10-CM | POA: Diagnosis not present

## 2015-12-03 DIAGNOSIS — D631 Anemia in chronic kidney disease: Secondary | ICD-10-CM | POA: Diagnosis not present

## 2015-12-03 DIAGNOSIS — N2581 Secondary hyperparathyroidism of renal origin: Secondary | ICD-10-CM | POA: Diagnosis not present

## 2015-12-05 DIAGNOSIS — D631 Anemia in chronic kidney disease: Secondary | ICD-10-CM | POA: Diagnosis not present

## 2015-12-05 DIAGNOSIS — N2581 Secondary hyperparathyroidism of renal origin: Secondary | ICD-10-CM | POA: Diagnosis not present

## 2015-12-05 DIAGNOSIS — N186 End stage renal disease: Secondary | ICD-10-CM | POA: Diagnosis not present

## 2015-12-05 DIAGNOSIS — E1129 Type 2 diabetes mellitus with other diabetic kidney complication: Secondary | ICD-10-CM | POA: Diagnosis not present

## 2015-12-08 DIAGNOSIS — N186 End stage renal disease: Secondary | ICD-10-CM | POA: Diagnosis not present

## 2015-12-08 DIAGNOSIS — N2581 Secondary hyperparathyroidism of renal origin: Secondary | ICD-10-CM | POA: Diagnosis not present

## 2015-12-08 DIAGNOSIS — E1129 Type 2 diabetes mellitus with other diabetic kidney complication: Secondary | ICD-10-CM | POA: Diagnosis not present

## 2015-12-08 DIAGNOSIS — D631 Anemia in chronic kidney disease: Secondary | ICD-10-CM | POA: Diagnosis not present

## 2015-12-10 DIAGNOSIS — D631 Anemia in chronic kidney disease: Secondary | ICD-10-CM | POA: Diagnosis not present

## 2015-12-10 DIAGNOSIS — N186 End stage renal disease: Secondary | ICD-10-CM | POA: Diagnosis not present

## 2015-12-10 DIAGNOSIS — E1129 Type 2 diabetes mellitus with other diabetic kidney complication: Secondary | ICD-10-CM | POA: Diagnosis not present

## 2015-12-10 DIAGNOSIS — N2581 Secondary hyperparathyroidism of renal origin: Secondary | ICD-10-CM | POA: Diagnosis not present

## 2015-12-12 DIAGNOSIS — N186 End stage renal disease: Secondary | ICD-10-CM | POA: Diagnosis not present

## 2015-12-12 DIAGNOSIS — N2581 Secondary hyperparathyroidism of renal origin: Secondary | ICD-10-CM | POA: Diagnosis not present

## 2015-12-12 DIAGNOSIS — D631 Anemia in chronic kidney disease: Secondary | ICD-10-CM | POA: Diagnosis not present

## 2015-12-12 DIAGNOSIS — E1129 Type 2 diabetes mellitus with other diabetic kidney complication: Secondary | ICD-10-CM | POA: Diagnosis not present

## 2015-12-15 DIAGNOSIS — E1129 Type 2 diabetes mellitus with other diabetic kidney complication: Secondary | ICD-10-CM | POA: Diagnosis not present

## 2015-12-15 DIAGNOSIS — D631 Anemia in chronic kidney disease: Secondary | ICD-10-CM | POA: Diagnosis not present

## 2015-12-15 DIAGNOSIS — N2581 Secondary hyperparathyroidism of renal origin: Secondary | ICD-10-CM | POA: Diagnosis not present

## 2015-12-15 DIAGNOSIS — N186 End stage renal disease: Secondary | ICD-10-CM | POA: Diagnosis not present

## 2015-12-17 DIAGNOSIS — D631 Anemia in chronic kidney disease: Secondary | ICD-10-CM | POA: Diagnosis not present

## 2015-12-17 DIAGNOSIS — E1129 Type 2 diabetes mellitus with other diabetic kidney complication: Secondary | ICD-10-CM | POA: Diagnosis not present

## 2015-12-17 DIAGNOSIS — N186 End stage renal disease: Secondary | ICD-10-CM | POA: Diagnosis not present

## 2015-12-17 DIAGNOSIS — N2581 Secondary hyperparathyroidism of renal origin: Secondary | ICD-10-CM | POA: Diagnosis not present

## 2015-12-19 DIAGNOSIS — N2581 Secondary hyperparathyroidism of renal origin: Secondary | ICD-10-CM | POA: Diagnosis not present

## 2015-12-19 DIAGNOSIS — E1129 Type 2 diabetes mellitus with other diabetic kidney complication: Secondary | ICD-10-CM | POA: Diagnosis not present

## 2015-12-19 DIAGNOSIS — D631 Anemia in chronic kidney disease: Secondary | ICD-10-CM | POA: Diagnosis not present

## 2015-12-19 DIAGNOSIS — N186 End stage renal disease: Secondary | ICD-10-CM | POA: Diagnosis not present

## 2015-12-22 DIAGNOSIS — D631 Anemia in chronic kidney disease: Secondary | ICD-10-CM | POA: Diagnosis not present

## 2015-12-22 DIAGNOSIS — N186 End stage renal disease: Secondary | ICD-10-CM | POA: Diagnosis not present

## 2015-12-22 DIAGNOSIS — N2581 Secondary hyperparathyroidism of renal origin: Secondary | ICD-10-CM | POA: Diagnosis not present

## 2015-12-22 DIAGNOSIS — E1129 Type 2 diabetes mellitus with other diabetic kidney complication: Secondary | ICD-10-CM | POA: Diagnosis not present

## 2015-12-24 DIAGNOSIS — N2581 Secondary hyperparathyroidism of renal origin: Secondary | ICD-10-CM | POA: Diagnosis not present

## 2015-12-24 DIAGNOSIS — N186 End stage renal disease: Secondary | ICD-10-CM | POA: Diagnosis not present

## 2015-12-24 DIAGNOSIS — E1129 Type 2 diabetes mellitus with other diabetic kidney complication: Secondary | ICD-10-CM | POA: Diagnosis not present

## 2015-12-24 DIAGNOSIS — D631 Anemia in chronic kidney disease: Secondary | ICD-10-CM | POA: Diagnosis not present

## 2015-12-26 DIAGNOSIS — E1129 Type 2 diabetes mellitus with other diabetic kidney complication: Secondary | ICD-10-CM | POA: Diagnosis not present

## 2015-12-26 DIAGNOSIS — N186 End stage renal disease: Secondary | ICD-10-CM | POA: Diagnosis not present

## 2015-12-26 DIAGNOSIS — D631 Anemia in chronic kidney disease: Secondary | ICD-10-CM | POA: Diagnosis not present

## 2015-12-26 DIAGNOSIS — N2581 Secondary hyperparathyroidism of renal origin: Secondary | ICD-10-CM | POA: Diagnosis not present

## 2015-12-28 DIAGNOSIS — E1129 Type 2 diabetes mellitus with other diabetic kidney complication: Secondary | ICD-10-CM | POA: Diagnosis not present

## 2015-12-28 DIAGNOSIS — Z992 Dependence on renal dialysis: Secondary | ICD-10-CM | POA: Diagnosis not present

## 2015-12-28 DIAGNOSIS — N186 End stage renal disease: Secondary | ICD-10-CM | POA: Diagnosis not present

## 2015-12-29 DIAGNOSIS — E1129 Type 2 diabetes mellitus with other diabetic kidney complication: Secondary | ICD-10-CM | POA: Diagnosis not present

## 2015-12-29 DIAGNOSIS — N186 End stage renal disease: Secondary | ICD-10-CM | POA: Diagnosis not present

## 2015-12-29 DIAGNOSIS — N2581 Secondary hyperparathyroidism of renal origin: Secondary | ICD-10-CM | POA: Diagnosis not present

## 2015-12-29 DIAGNOSIS — D631 Anemia in chronic kidney disease: Secondary | ICD-10-CM | POA: Diagnosis not present

## 2016-01-13 DIAGNOSIS — E1151 Type 2 diabetes mellitus with diabetic peripheral angiopathy without gangrene: Secondary | ICD-10-CM | POA: Diagnosis not present

## 2016-01-13 DIAGNOSIS — E1140 Type 2 diabetes mellitus with diabetic neuropathy, unspecified: Secondary | ICD-10-CM | POA: Diagnosis not present

## 2016-01-13 DIAGNOSIS — B351 Tinea unguium: Secondary | ICD-10-CM | POA: Diagnosis not present

## 2016-01-28 DIAGNOSIS — E1129 Type 2 diabetes mellitus with other diabetic kidney complication: Secondary | ICD-10-CM | POA: Diagnosis not present

## 2016-01-28 DIAGNOSIS — N186 End stage renal disease: Secondary | ICD-10-CM | POA: Diagnosis not present

## 2016-01-28 DIAGNOSIS — Z992 Dependence on renal dialysis: Secondary | ICD-10-CM | POA: Diagnosis not present

## 2016-01-30 DIAGNOSIS — N186 End stage renal disease: Secondary | ICD-10-CM | POA: Diagnosis not present

## 2016-01-30 DIAGNOSIS — Z23 Encounter for immunization: Secondary | ICD-10-CM | POA: Diagnosis not present

## 2016-01-30 DIAGNOSIS — E1129 Type 2 diabetes mellitus with other diabetic kidney complication: Secondary | ICD-10-CM | POA: Diagnosis not present

## 2016-01-30 DIAGNOSIS — D631 Anemia in chronic kidney disease: Secondary | ICD-10-CM | POA: Diagnosis not present

## 2016-01-30 DIAGNOSIS — N2581 Secondary hyperparathyroidism of renal origin: Secondary | ICD-10-CM | POA: Diagnosis not present

## 2016-02-08 DIAGNOSIS — H52223 Regular astigmatism, bilateral: Secondary | ICD-10-CM | POA: Diagnosis not present

## 2016-02-08 DIAGNOSIS — H5201 Hypermetropia, right eye: Secondary | ICD-10-CM | POA: Diagnosis not present

## 2016-02-08 DIAGNOSIS — E11319 Type 2 diabetes mellitus with unspecified diabetic retinopathy without macular edema: Secondary | ICD-10-CM | POA: Diagnosis not present

## 2016-02-08 DIAGNOSIS — H5212 Myopia, left eye: Secondary | ICD-10-CM | POA: Diagnosis not present

## 2016-02-26 DIAGNOSIS — N186 End stage renal disease: Secondary | ICD-10-CM | POA: Diagnosis not present

## 2016-02-26 DIAGNOSIS — E1342 Other specified diabetes mellitus with diabetic polyneuropathy: Secondary | ICD-10-CM | POA: Diagnosis not present

## 2016-02-26 DIAGNOSIS — Z89612 Acquired absence of left leg above knee: Secondary | ICD-10-CM | POA: Diagnosis not present

## 2016-02-26 DIAGNOSIS — I10 Essential (primary) hypertension: Secondary | ICD-10-CM | POA: Diagnosis not present

## 2016-02-26 DIAGNOSIS — E1142 Type 2 diabetes mellitus with diabetic polyneuropathy: Secondary | ICD-10-CM | POA: Diagnosis not present

## 2016-02-27 DIAGNOSIS — Z992 Dependence on renal dialysis: Secondary | ICD-10-CM | POA: Diagnosis not present

## 2016-02-27 DIAGNOSIS — N186 End stage renal disease: Secondary | ICD-10-CM | POA: Diagnosis not present

## 2016-02-27 DIAGNOSIS — E1129 Type 2 diabetes mellitus with other diabetic kidney complication: Secondary | ICD-10-CM | POA: Diagnosis not present

## 2016-02-29 DIAGNOSIS — M130 Polyarthritis, unspecified: Secondary | ICD-10-CM | POA: Diagnosis not present

## 2016-02-29 DIAGNOSIS — E1140 Type 2 diabetes mellitus with diabetic neuropathy, unspecified: Secondary | ICD-10-CM | POA: Diagnosis not present

## 2016-02-29 DIAGNOSIS — G5623 Lesion of ulnar nerve, bilateral upper limbs: Secondary | ICD-10-CM | POA: Diagnosis not present

## 2016-02-29 DIAGNOSIS — G546 Phantom limb syndrome with pain: Secondary | ICD-10-CM | POA: Diagnosis not present

## 2016-02-29 DIAGNOSIS — M25512 Pain in left shoulder: Secondary | ICD-10-CM | POA: Diagnosis not present

## 2016-02-29 DIAGNOSIS — I10 Essential (primary) hypertension: Secondary | ICD-10-CM | POA: Diagnosis not present

## 2016-03-01 DIAGNOSIS — N186 End stage renal disease: Secondary | ICD-10-CM | POA: Diagnosis not present

## 2016-03-01 DIAGNOSIS — E1129 Type 2 diabetes mellitus with other diabetic kidney complication: Secondary | ICD-10-CM | POA: Diagnosis not present

## 2016-03-01 DIAGNOSIS — D631 Anemia in chronic kidney disease: Secondary | ICD-10-CM | POA: Diagnosis not present

## 2016-03-01 DIAGNOSIS — N2581 Secondary hyperparathyroidism of renal origin: Secondary | ICD-10-CM | POA: Diagnosis not present

## 2016-03-03 DIAGNOSIS — D631 Anemia in chronic kidney disease: Secondary | ICD-10-CM | POA: Diagnosis not present

## 2016-03-03 DIAGNOSIS — N186 End stage renal disease: Secondary | ICD-10-CM | POA: Diagnosis not present

## 2016-03-03 DIAGNOSIS — N2581 Secondary hyperparathyroidism of renal origin: Secondary | ICD-10-CM | POA: Diagnosis not present

## 2016-03-03 DIAGNOSIS — E1129 Type 2 diabetes mellitus with other diabetic kidney complication: Secondary | ICD-10-CM | POA: Diagnosis not present

## 2016-03-05 DIAGNOSIS — N186 End stage renal disease: Secondary | ICD-10-CM | POA: Diagnosis not present

## 2016-03-05 DIAGNOSIS — D631 Anemia in chronic kidney disease: Secondary | ICD-10-CM | POA: Diagnosis not present

## 2016-03-05 DIAGNOSIS — E1129 Type 2 diabetes mellitus with other diabetic kidney complication: Secondary | ICD-10-CM | POA: Diagnosis not present

## 2016-03-05 DIAGNOSIS — N2581 Secondary hyperparathyroidism of renal origin: Secondary | ICD-10-CM | POA: Diagnosis not present

## 2016-03-07 DIAGNOSIS — E538 Deficiency of other specified B group vitamins: Secondary | ICD-10-CM | POA: Diagnosis not present

## 2016-03-07 DIAGNOSIS — M818 Other osteoporosis without current pathological fracture: Secondary | ICD-10-CM | POA: Diagnosis not present

## 2016-03-07 DIAGNOSIS — Z79899 Other long term (current) drug therapy: Secondary | ICD-10-CM | POA: Diagnosis not present

## 2016-03-07 DIAGNOSIS — E559 Vitamin D deficiency, unspecified: Secondary | ICD-10-CM | POA: Diagnosis not present

## 2016-03-07 DIAGNOSIS — R5383 Other fatigue: Secondary | ICD-10-CM | POA: Diagnosis not present

## 2016-03-08 DIAGNOSIS — D631 Anemia in chronic kidney disease: Secondary | ICD-10-CM | POA: Diagnosis not present

## 2016-03-08 DIAGNOSIS — N186 End stage renal disease: Secondary | ICD-10-CM | POA: Diagnosis not present

## 2016-03-08 DIAGNOSIS — E1129 Type 2 diabetes mellitus with other diabetic kidney complication: Secondary | ICD-10-CM | POA: Diagnosis not present

## 2016-03-08 DIAGNOSIS — N2581 Secondary hyperparathyroidism of renal origin: Secondary | ICD-10-CM | POA: Diagnosis not present

## 2016-03-10 DIAGNOSIS — D631 Anemia in chronic kidney disease: Secondary | ICD-10-CM | POA: Diagnosis not present

## 2016-03-10 DIAGNOSIS — G5601 Carpal tunnel syndrome, right upper limb: Secondary | ICD-10-CM | POA: Diagnosis not present

## 2016-03-10 DIAGNOSIS — G5602 Carpal tunnel syndrome, left upper limb: Secondary | ICD-10-CM | POA: Diagnosis not present

## 2016-03-10 DIAGNOSIS — N2581 Secondary hyperparathyroidism of renal origin: Secondary | ICD-10-CM | POA: Diagnosis not present

## 2016-03-10 DIAGNOSIS — N186 End stage renal disease: Secondary | ICD-10-CM | POA: Diagnosis not present

## 2016-03-10 DIAGNOSIS — E1129 Type 2 diabetes mellitus with other diabetic kidney complication: Secondary | ICD-10-CM | POA: Diagnosis not present

## 2016-03-12 DIAGNOSIS — N186 End stage renal disease: Secondary | ICD-10-CM | POA: Diagnosis not present

## 2016-03-12 DIAGNOSIS — D631 Anemia in chronic kidney disease: Secondary | ICD-10-CM | POA: Diagnosis not present

## 2016-03-12 DIAGNOSIS — N2581 Secondary hyperparathyroidism of renal origin: Secondary | ICD-10-CM | POA: Diagnosis not present

## 2016-03-12 DIAGNOSIS — E1129 Type 2 diabetes mellitus with other diabetic kidney complication: Secondary | ICD-10-CM | POA: Diagnosis not present

## 2016-03-15 DIAGNOSIS — D631 Anemia in chronic kidney disease: Secondary | ICD-10-CM | POA: Diagnosis not present

## 2016-03-15 DIAGNOSIS — E1129 Type 2 diabetes mellitus with other diabetic kidney complication: Secondary | ICD-10-CM | POA: Diagnosis not present

## 2016-03-15 DIAGNOSIS — N2581 Secondary hyperparathyroidism of renal origin: Secondary | ICD-10-CM | POA: Diagnosis not present

## 2016-03-15 DIAGNOSIS — N186 End stage renal disease: Secondary | ICD-10-CM | POA: Diagnosis not present

## 2016-03-17 DIAGNOSIS — N186 End stage renal disease: Secondary | ICD-10-CM | POA: Diagnosis not present

## 2016-03-17 DIAGNOSIS — N2581 Secondary hyperparathyroidism of renal origin: Secondary | ICD-10-CM | POA: Diagnosis not present

## 2016-03-17 DIAGNOSIS — E1129 Type 2 diabetes mellitus with other diabetic kidney complication: Secondary | ICD-10-CM | POA: Diagnosis not present

## 2016-03-17 DIAGNOSIS — D631 Anemia in chronic kidney disease: Secondary | ICD-10-CM | POA: Diagnosis not present

## 2016-03-18 DIAGNOSIS — N186 End stage renal disease: Secondary | ICD-10-CM | POA: Diagnosis not present

## 2016-03-18 DIAGNOSIS — T82858D Stenosis of vascular prosthetic devices, implants and grafts, subsequent encounter: Secondary | ICD-10-CM | POA: Diagnosis not present

## 2016-03-18 DIAGNOSIS — I871 Compression of vein: Secondary | ICD-10-CM | POA: Diagnosis not present

## 2016-03-18 DIAGNOSIS — Z992 Dependence on renal dialysis: Secondary | ICD-10-CM | POA: Diagnosis not present

## 2016-03-19 DIAGNOSIS — N2581 Secondary hyperparathyroidism of renal origin: Secondary | ICD-10-CM | POA: Diagnosis not present

## 2016-03-19 DIAGNOSIS — E1129 Type 2 diabetes mellitus with other diabetic kidney complication: Secondary | ICD-10-CM | POA: Diagnosis not present

## 2016-03-19 DIAGNOSIS — D631 Anemia in chronic kidney disease: Secondary | ICD-10-CM | POA: Diagnosis not present

## 2016-03-19 DIAGNOSIS — N186 End stage renal disease: Secondary | ICD-10-CM | POA: Diagnosis not present

## 2016-03-22 DIAGNOSIS — N2581 Secondary hyperparathyroidism of renal origin: Secondary | ICD-10-CM | POA: Diagnosis not present

## 2016-03-22 DIAGNOSIS — N186 End stage renal disease: Secondary | ICD-10-CM | POA: Diagnosis not present

## 2016-03-22 DIAGNOSIS — D631 Anemia in chronic kidney disease: Secondary | ICD-10-CM | POA: Diagnosis not present

## 2016-03-22 DIAGNOSIS — E1129 Type 2 diabetes mellitus with other diabetic kidney complication: Secondary | ICD-10-CM | POA: Diagnosis not present

## 2016-03-24 DIAGNOSIS — E1129 Type 2 diabetes mellitus with other diabetic kidney complication: Secondary | ICD-10-CM | POA: Diagnosis not present

## 2016-03-24 DIAGNOSIS — N2581 Secondary hyperparathyroidism of renal origin: Secondary | ICD-10-CM | POA: Diagnosis not present

## 2016-03-24 DIAGNOSIS — N186 End stage renal disease: Secondary | ICD-10-CM | POA: Diagnosis not present

## 2016-03-24 DIAGNOSIS — D631 Anemia in chronic kidney disease: Secondary | ICD-10-CM | POA: Diagnosis not present

## 2016-03-26 DIAGNOSIS — N2581 Secondary hyperparathyroidism of renal origin: Secondary | ICD-10-CM | POA: Diagnosis not present

## 2016-03-26 DIAGNOSIS — D631 Anemia in chronic kidney disease: Secondary | ICD-10-CM | POA: Diagnosis not present

## 2016-03-26 DIAGNOSIS — N186 End stage renal disease: Secondary | ICD-10-CM | POA: Diagnosis not present

## 2016-03-26 DIAGNOSIS — E1129 Type 2 diabetes mellitus with other diabetic kidney complication: Secondary | ICD-10-CM | POA: Diagnosis not present

## 2016-03-29 DIAGNOSIS — N2581 Secondary hyperparathyroidism of renal origin: Secondary | ICD-10-CM | POA: Diagnosis not present

## 2016-03-29 DIAGNOSIS — N186 End stage renal disease: Secondary | ICD-10-CM | POA: Diagnosis not present

## 2016-03-29 DIAGNOSIS — D631 Anemia in chronic kidney disease: Secondary | ICD-10-CM | POA: Diagnosis not present

## 2016-03-29 DIAGNOSIS — Z992 Dependence on renal dialysis: Secondary | ICD-10-CM | POA: Diagnosis not present

## 2016-03-29 DIAGNOSIS — E1129 Type 2 diabetes mellitus with other diabetic kidney complication: Secondary | ICD-10-CM | POA: Diagnosis not present

## 2016-03-30 DIAGNOSIS — B351 Tinea unguium: Secondary | ICD-10-CM | POA: Diagnosis not present

## 2016-03-30 DIAGNOSIS — E1140 Type 2 diabetes mellitus with diabetic neuropathy, unspecified: Secondary | ICD-10-CM | POA: Diagnosis not present

## 2016-03-30 DIAGNOSIS — E1151 Type 2 diabetes mellitus with diabetic peripheral angiopathy without gangrene: Secondary | ICD-10-CM | POA: Diagnosis not present

## 2016-03-31 DIAGNOSIS — N2589 Other disorders resulting from impaired renal tubular function: Secondary | ICD-10-CM | POA: Diagnosis not present

## 2016-03-31 DIAGNOSIS — N186 End stage renal disease: Secondary | ICD-10-CM | POA: Diagnosis not present

## 2016-03-31 DIAGNOSIS — N2581 Secondary hyperparathyroidism of renal origin: Secondary | ICD-10-CM | POA: Diagnosis not present

## 2016-03-31 DIAGNOSIS — D631 Anemia in chronic kidney disease: Secondary | ICD-10-CM | POA: Diagnosis not present

## 2016-03-31 DIAGNOSIS — E1129 Type 2 diabetes mellitus with other diabetic kidney complication: Secondary | ICD-10-CM | POA: Diagnosis not present

## 2016-04-02 DIAGNOSIS — N186 End stage renal disease: Secondary | ICD-10-CM | POA: Diagnosis not present

## 2016-04-02 DIAGNOSIS — N2589 Other disorders resulting from impaired renal tubular function: Secondary | ICD-10-CM | POA: Diagnosis not present

## 2016-04-02 DIAGNOSIS — E1129 Type 2 diabetes mellitus with other diabetic kidney complication: Secondary | ICD-10-CM | POA: Diagnosis not present

## 2016-04-02 DIAGNOSIS — D631 Anemia in chronic kidney disease: Secondary | ICD-10-CM | POA: Diagnosis not present

## 2016-04-02 DIAGNOSIS — N2581 Secondary hyperparathyroidism of renal origin: Secondary | ICD-10-CM | POA: Diagnosis not present

## 2016-04-05 DIAGNOSIS — N2581 Secondary hyperparathyroidism of renal origin: Secondary | ICD-10-CM | POA: Diagnosis not present

## 2016-04-05 DIAGNOSIS — D631 Anemia in chronic kidney disease: Secondary | ICD-10-CM | POA: Diagnosis not present

## 2016-04-05 DIAGNOSIS — N186 End stage renal disease: Secondary | ICD-10-CM | POA: Diagnosis not present

## 2016-04-05 DIAGNOSIS — E1129 Type 2 diabetes mellitus with other diabetic kidney complication: Secondary | ICD-10-CM | POA: Diagnosis not present

## 2016-04-05 DIAGNOSIS — N2589 Other disorders resulting from impaired renal tubular function: Secondary | ICD-10-CM | POA: Diagnosis not present

## 2016-04-07 DIAGNOSIS — N186 End stage renal disease: Secondary | ICD-10-CM | POA: Diagnosis not present

## 2016-04-07 DIAGNOSIS — E1129 Type 2 diabetes mellitus with other diabetic kidney complication: Secondary | ICD-10-CM | POA: Diagnosis not present

## 2016-04-07 DIAGNOSIS — N2581 Secondary hyperparathyroidism of renal origin: Secondary | ICD-10-CM | POA: Diagnosis not present

## 2016-04-07 DIAGNOSIS — N2589 Other disorders resulting from impaired renal tubular function: Secondary | ICD-10-CM | POA: Diagnosis not present

## 2016-04-07 DIAGNOSIS — D631 Anemia in chronic kidney disease: Secondary | ICD-10-CM | POA: Diagnosis not present

## 2016-04-08 DIAGNOSIS — Z992 Dependence on renal dialysis: Secondary | ICD-10-CM | POA: Diagnosis not present

## 2016-04-08 DIAGNOSIS — I771 Stricture of artery: Secondary | ICD-10-CM | POA: Diagnosis not present

## 2016-04-08 DIAGNOSIS — N186 End stage renal disease: Secondary | ICD-10-CM | POA: Diagnosis not present

## 2016-04-08 DIAGNOSIS — T82858A Stenosis of vascular prosthetic devices, implants and grafts, initial encounter: Secondary | ICD-10-CM | POA: Diagnosis not present

## 2016-04-09 DIAGNOSIS — N2581 Secondary hyperparathyroidism of renal origin: Secondary | ICD-10-CM | POA: Diagnosis not present

## 2016-04-09 DIAGNOSIS — N186 End stage renal disease: Secondary | ICD-10-CM | POA: Diagnosis not present

## 2016-04-09 DIAGNOSIS — D631 Anemia in chronic kidney disease: Secondary | ICD-10-CM | POA: Diagnosis not present

## 2016-04-09 DIAGNOSIS — E1129 Type 2 diabetes mellitus with other diabetic kidney complication: Secondary | ICD-10-CM | POA: Diagnosis not present

## 2016-04-09 DIAGNOSIS — N2589 Other disorders resulting from impaired renal tubular function: Secondary | ICD-10-CM | POA: Diagnosis not present

## 2016-04-12 ENCOUNTER — Encounter (HOSPITAL_COMMUNITY)
Admission: RE | Admit: 2016-04-12 | Discharge: 2016-04-12 | Disposition: A | Discharge status: Home / Self Care | Payer: Medicare Other | Source: Other Acute Inpatient Hospital | Attending: Nephrology | Admitting: Nephrology

## 2016-04-12 DIAGNOSIS — D631 Anemia in chronic kidney disease: Secondary | ICD-10-CM | POA: Diagnosis not present

## 2016-04-12 DIAGNOSIS — R58 Hemorrhage, not elsewhere classified: Secondary | ICD-10-CM | POA: Insufficient documentation

## 2016-04-12 DIAGNOSIS — E1129 Type 2 diabetes mellitus with other diabetic kidney complication: Secondary | ICD-10-CM | POA: Diagnosis not present

## 2016-04-12 DIAGNOSIS — N186 End stage renal disease: Secondary | ICD-10-CM | POA: Diagnosis not present

## 2016-04-12 DIAGNOSIS — N2589 Other disorders resulting from impaired renal tubular function: Secondary | ICD-10-CM | POA: Diagnosis not present

## 2016-04-12 DIAGNOSIS — N2581 Secondary hyperparathyroidism of renal origin: Secondary | ICD-10-CM | POA: Diagnosis not present

## 2016-04-12 LAB — HEMOGLOBIN AND HEMATOCRIT, BLOOD
HCT: 28 % — ABNORMAL LOW (ref 36.0–46.0)
HEMOGLOBIN: 8.7 g/dL — AB (ref 12.0–15.0)

## 2016-04-13 DIAGNOSIS — N2589 Other disorders resulting from impaired renal tubular function: Secondary | ICD-10-CM | POA: Diagnosis not present

## 2016-04-13 DIAGNOSIS — N186 End stage renal disease: Secondary | ICD-10-CM | POA: Diagnosis not present

## 2016-04-13 DIAGNOSIS — E1129 Type 2 diabetes mellitus with other diabetic kidney complication: Secondary | ICD-10-CM | POA: Diagnosis not present

## 2016-04-13 DIAGNOSIS — N2581 Secondary hyperparathyroidism of renal origin: Secondary | ICD-10-CM | POA: Diagnosis not present

## 2016-04-13 DIAGNOSIS — D631 Anemia in chronic kidney disease: Secondary | ICD-10-CM | POA: Diagnosis not present

## 2016-04-14 DIAGNOSIS — N186 End stage renal disease: Secondary | ICD-10-CM | POA: Diagnosis not present

## 2016-04-14 DIAGNOSIS — N2581 Secondary hyperparathyroidism of renal origin: Secondary | ICD-10-CM | POA: Diagnosis not present

## 2016-04-14 DIAGNOSIS — D631 Anemia in chronic kidney disease: Secondary | ICD-10-CM | POA: Diagnosis not present

## 2016-04-14 DIAGNOSIS — N2589 Other disorders resulting from impaired renal tubular function: Secondary | ICD-10-CM | POA: Diagnosis not present

## 2016-04-14 DIAGNOSIS — E1129 Type 2 diabetes mellitus with other diabetic kidney complication: Secondary | ICD-10-CM | POA: Diagnosis not present

## 2016-04-16 DIAGNOSIS — N2581 Secondary hyperparathyroidism of renal origin: Secondary | ICD-10-CM | POA: Diagnosis not present

## 2016-04-16 DIAGNOSIS — E1129 Type 2 diabetes mellitus with other diabetic kidney complication: Secondary | ICD-10-CM | POA: Diagnosis not present

## 2016-04-16 DIAGNOSIS — D631 Anemia in chronic kidney disease: Secondary | ICD-10-CM | POA: Diagnosis not present

## 2016-04-16 DIAGNOSIS — N186 End stage renal disease: Secondary | ICD-10-CM | POA: Diagnosis not present

## 2016-04-16 DIAGNOSIS — N2589 Other disorders resulting from impaired renal tubular function: Secondary | ICD-10-CM | POA: Diagnosis not present

## 2016-04-19 DIAGNOSIS — N2581 Secondary hyperparathyroidism of renal origin: Secondary | ICD-10-CM | POA: Diagnosis not present

## 2016-04-19 DIAGNOSIS — N186 End stage renal disease: Secondary | ICD-10-CM | POA: Diagnosis not present

## 2016-04-19 DIAGNOSIS — N2589 Other disorders resulting from impaired renal tubular function: Secondary | ICD-10-CM | POA: Diagnosis not present

## 2016-04-19 DIAGNOSIS — E1129 Type 2 diabetes mellitus with other diabetic kidney complication: Secondary | ICD-10-CM | POA: Diagnosis not present

## 2016-04-19 DIAGNOSIS — D631 Anemia in chronic kidney disease: Secondary | ICD-10-CM | POA: Diagnosis not present

## 2016-04-22 DIAGNOSIS — N186 End stage renal disease: Secondary | ICD-10-CM | POA: Diagnosis not present

## 2016-04-22 DIAGNOSIS — N2589 Other disorders resulting from impaired renal tubular function: Secondary | ICD-10-CM | POA: Diagnosis not present

## 2016-04-22 DIAGNOSIS — E1129 Type 2 diabetes mellitus with other diabetic kidney complication: Secondary | ICD-10-CM | POA: Diagnosis not present

## 2016-04-22 DIAGNOSIS — D631 Anemia in chronic kidney disease: Secondary | ICD-10-CM | POA: Diagnosis not present

## 2016-04-22 DIAGNOSIS — N2581 Secondary hyperparathyroidism of renal origin: Secondary | ICD-10-CM | POA: Diagnosis not present

## 2016-04-23 DIAGNOSIS — N186 End stage renal disease: Secondary | ICD-10-CM | POA: Diagnosis not present

## 2016-04-23 DIAGNOSIS — N2589 Other disorders resulting from impaired renal tubular function: Secondary | ICD-10-CM | POA: Diagnosis not present

## 2016-04-23 DIAGNOSIS — N2581 Secondary hyperparathyroidism of renal origin: Secondary | ICD-10-CM | POA: Diagnosis not present

## 2016-04-23 DIAGNOSIS — E1129 Type 2 diabetes mellitus with other diabetic kidney complication: Secondary | ICD-10-CM | POA: Diagnosis not present

## 2016-04-23 DIAGNOSIS — D631 Anemia in chronic kidney disease: Secondary | ICD-10-CM | POA: Diagnosis not present

## 2016-04-26 DIAGNOSIS — N2589 Other disorders resulting from impaired renal tubular function: Secondary | ICD-10-CM | POA: Diagnosis not present

## 2016-04-26 DIAGNOSIS — N186 End stage renal disease: Secondary | ICD-10-CM | POA: Diagnosis not present

## 2016-04-26 DIAGNOSIS — E1129 Type 2 diabetes mellitus with other diabetic kidney complication: Secondary | ICD-10-CM | POA: Diagnosis not present

## 2016-04-26 DIAGNOSIS — D631 Anemia in chronic kidney disease: Secondary | ICD-10-CM | POA: Diagnosis not present

## 2016-04-26 DIAGNOSIS — N2581 Secondary hyperparathyroidism of renal origin: Secondary | ICD-10-CM | POA: Diagnosis not present

## 2016-04-28 DIAGNOSIS — E1129 Type 2 diabetes mellitus with other diabetic kidney complication: Secondary | ICD-10-CM | POA: Diagnosis not present

## 2016-04-28 DIAGNOSIS — N2589 Other disorders resulting from impaired renal tubular function: Secondary | ICD-10-CM | POA: Diagnosis not present

## 2016-04-28 DIAGNOSIS — D631 Anemia in chronic kidney disease: Secondary | ICD-10-CM | POA: Diagnosis not present

## 2016-04-28 DIAGNOSIS — Z992 Dependence on renal dialysis: Secondary | ICD-10-CM | POA: Diagnosis not present

## 2016-04-28 DIAGNOSIS — N2581 Secondary hyperparathyroidism of renal origin: Secondary | ICD-10-CM | POA: Diagnosis not present

## 2016-04-28 DIAGNOSIS — N186 End stage renal disease: Secondary | ICD-10-CM | POA: Diagnosis not present

## 2016-04-30 DIAGNOSIS — D631 Anemia in chronic kidney disease: Secondary | ICD-10-CM | POA: Diagnosis not present

## 2016-04-30 DIAGNOSIS — N186 End stage renal disease: Secondary | ICD-10-CM | POA: Diagnosis not present

## 2016-04-30 DIAGNOSIS — E1129 Type 2 diabetes mellitus with other diabetic kidney complication: Secondary | ICD-10-CM | POA: Diagnosis not present

## 2016-04-30 DIAGNOSIS — N2581 Secondary hyperparathyroidism of renal origin: Secondary | ICD-10-CM | POA: Diagnosis not present

## 2016-05-03 DIAGNOSIS — D631 Anemia in chronic kidney disease: Secondary | ICD-10-CM | POA: Diagnosis not present

## 2016-05-03 DIAGNOSIS — N186 End stage renal disease: Secondary | ICD-10-CM | POA: Diagnosis not present

## 2016-05-03 DIAGNOSIS — N2581 Secondary hyperparathyroidism of renal origin: Secondary | ICD-10-CM | POA: Diagnosis not present

## 2016-05-03 DIAGNOSIS — E1129 Type 2 diabetes mellitus with other diabetic kidney complication: Secondary | ICD-10-CM | POA: Diagnosis not present

## 2016-05-05 DIAGNOSIS — N2581 Secondary hyperparathyroidism of renal origin: Secondary | ICD-10-CM | POA: Diagnosis not present

## 2016-05-05 DIAGNOSIS — N186 End stage renal disease: Secondary | ICD-10-CM | POA: Diagnosis not present

## 2016-05-05 DIAGNOSIS — E1129 Type 2 diabetes mellitus with other diabetic kidney complication: Secondary | ICD-10-CM | POA: Diagnosis not present

## 2016-05-05 DIAGNOSIS — D631 Anemia in chronic kidney disease: Secondary | ICD-10-CM | POA: Diagnosis not present

## 2016-05-07 DIAGNOSIS — D631 Anemia in chronic kidney disease: Secondary | ICD-10-CM | POA: Diagnosis not present

## 2016-05-07 DIAGNOSIS — E1129 Type 2 diabetes mellitus with other diabetic kidney complication: Secondary | ICD-10-CM | POA: Diagnosis not present

## 2016-05-07 DIAGNOSIS — N186 End stage renal disease: Secondary | ICD-10-CM | POA: Diagnosis not present

## 2016-05-07 DIAGNOSIS — N2581 Secondary hyperparathyroidism of renal origin: Secondary | ICD-10-CM | POA: Diagnosis not present

## 2016-05-10 DIAGNOSIS — N186 End stage renal disease: Secondary | ICD-10-CM | POA: Diagnosis not present

## 2016-05-10 DIAGNOSIS — E1129 Type 2 diabetes mellitus with other diabetic kidney complication: Secondary | ICD-10-CM | POA: Diagnosis not present

## 2016-05-10 DIAGNOSIS — N2581 Secondary hyperparathyroidism of renal origin: Secondary | ICD-10-CM | POA: Diagnosis not present

## 2016-05-10 DIAGNOSIS — D631 Anemia in chronic kidney disease: Secondary | ICD-10-CM | POA: Diagnosis not present

## 2016-05-12 DIAGNOSIS — D631 Anemia in chronic kidney disease: Secondary | ICD-10-CM | POA: Diagnosis not present

## 2016-05-12 DIAGNOSIS — N2581 Secondary hyperparathyroidism of renal origin: Secondary | ICD-10-CM | POA: Diagnosis not present

## 2016-05-12 DIAGNOSIS — E1129 Type 2 diabetes mellitus with other diabetic kidney complication: Secondary | ICD-10-CM | POA: Diagnosis not present

## 2016-05-12 DIAGNOSIS — N186 End stage renal disease: Secondary | ICD-10-CM | POA: Diagnosis not present

## 2016-05-14 DIAGNOSIS — D631 Anemia in chronic kidney disease: Secondary | ICD-10-CM | POA: Diagnosis not present

## 2016-05-14 DIAGNOSIS — N2581 Secondary hyperparathyroidism of renal origin: Secondary | ICD-10-CM | POA: Diagnosis not present

## 2016-05-14 DIAGNOSIS — E1129 Type 2 diabetes mellitus with other diabetic kidney complication: Secondary | ICD-10-CM | POA: Diagnosis not present

## 2016-05-14 DIAGNOSIS — N186 End stage renal disease: Secondary | ICD-10-CM | POA: Diagnosis not present

## 2016-05-17 DIAGNOSIS — N2581 Secondary hyperparathyroidism of renal origin: Secondary | ICD-10-CM | POA: Diagnosis not present

## 2016-05-17 DIAGNOSIS — E1129 Type 2 diabetes mellitus with other diabetic kidney complication: Secondary | ICD-10-CM | POA: Diagnosis not present

## 2016-05-17 DIAGNOSIS — D631 Anemia in chronic kidney disease: Secondary | ICD-10-CM | POA: Diagnosis not present

## 2016-05-17 DIAGNOSIS — N186 End stage renal disease: Secondary | ICD-10-CM | POA: Diagnosis not present

## 2016-05-19 DIAGNOSIS — D631 Anemia in chronic kidney disease: Secondary | ICD-10-CM | POA: Diagnosis not present

## 2016-05-19 DIAGNOSIS — N2581 Secondary hyperparathyroidism of renal origin: Secondary | ICD-10-CM | POA: Diagnosis not present

## 2016-05-19 DIAGNOSIS — E1129 Type 2 diabetes mellitus with other diabetic kidney complication: Secondary | ICD-10-CM | POA: Diagnosis not present

## 2016-05-19 DIAGNOSIS — N186 End stage renal disease: Secondary | ICD-10-CM | POA: Diagnosis not present

## 2016-05-21 DIAGNOSIS — E1129 Type 2 diabetes mellitus with other diabetic kidney complication: Secondary | ICD-10-CM | POA: Diagnosis not present

## 2016-05-21 DIAGNOSIS — N2581 Secondary hyperparathyroidism of renal origin: Secondary | ICD-10-CM | POA: Diagnosis not present

## 2016-05-21 DIAGNOSIS — N186 End stage renal disease: Secondary | ICD-10-CM | POA: Diagnosis not present

## 2016-05-21 DIAGNOSIS — D631 Anemia in chronic kidney disease: Secondary | ICD-10-CM | POA: Diagnosis not present

## 2016-05-24 DIAGNOSIS — E1129 Type 2 diabetes mellitus with other diabetic kidney complication: Secondary | ICD-10-CM | POA: Diagnosis not present

## 2016-05-24 DIAGNOSIS — N2581 Secondary hyperparathyroidism of renal origin: Secondary | ICD-10-CM | POA: Diagnosis not present

## 2016-05-24 DIAGNOSIS — D631 Anemia in chronic kidney disease: Secondary | ICD-10-CM | POA: Diagnosis not present

## 2016-05-24 DIAGNOSIS — N186 End stage renal disease: Secondary | ICD-10-CM | POA: Diagnosis not present

## 2016-05-26 DIAGNOSIS — E1129 Type 2 diabetes mellitus with other diabetic kidney complication: Secondary | ICD-10-CM | POA: Diagnosis not present

## 2016-05-26 DIAGNOSIS — N2581 Secondary hyperparathyroidism of renal origin: Secondary | ICD-10-CM | POA: Diagnosis not present

## 2016-05-26 DIAGNOSIS — N186 End stage renal disease: Secondary | ICD-10-CM | POA: Diagnosis not present

## 2016-05-26 DIAGNOSIS — D631 Anemia in chronic kidney disease: Secondary | ICD-10-CM | POA: Diagnosis not present

## 2016-05-28 DIAGNOSIS — E1129 Type 2 diabetes mellitus with other diabetic kidney complication: Secondary | ICD-10-CM | POA: Diagnosis not present

## 2016-05-28 DIAGNOSIS — N2581 Secondary hyperparathyroidism of renal origin: Secondary | ICD-10-CM | POA: Diagnosis not present

## 2016-05-28 DIAGNOSIS — D631 Anemia in chronic kidney disease: Secondary | ICD-10-CM | POA: Diagnosis not present

## 2016-05-28 DIAGNOSIS — N186 End stage renal disease: Secondary | ICD-10-CM | POA: Diagnosis not present

## 2016-05-29 DIAGNOSIS — E1129 Type 2 diabetes mellitus with other diabetic kidney complication: Secondary | ICD-10-CM | POA: Diagnosis not present

## 2016-05-29 DIAGNOSIS — N186 End stage renal disease: Secondary | ICD-10-CM | POA: Diagnosis not present

## 2016-05-29 DIAGNOSIS — Z992 Dependence on renal dialysis: Secondary | ICD-10-CM | POA: Diagnosis not present

## 2016-05-30 DIAGNOSIS — C801 Malignant (primary) neoplasm, unspecified: Secondary | ICD-10-CM

## 2016-05-30 HISTORY — DX: Malignant (primary) neoplasm, unspecified: C80.1

## 2016-05-31 DIAGNOSIS — D509 Iron deficiency anemia, unspecified: Secondary | ICD-10-CM | POA: Diagnosis not present

## 2016-05-31 DIAGNOSIS — N2581 Secondary hyperparathyroidism of renal origin: Secondary | ICD-10-CM | POA: Diagnosis not present

## 2016-05-31 DIAGNOSIS — D631 Anemia in chronic kidney disease: Secondary | ICD-10-CM | POA: Diagnosis not present

## 2016-05-31 DIAGNOSIS — N186 End stage renal disease: Secondary | ICD-10-CM | POA: Diagnosis not present

## 2016-05-31 DIAGNOSIS — E1129 Type 2 diabetes mellitus with other diabetic kidney complication: Secondary | ICD-10-CM | POA: Diagnosis not present

## 2016-06-01 DIAGNOSIS — N186 End stage renal disease: Secondary | ICD-10-CM | POA: Diagnosis not present

## 2016-06-01 DIAGNOSIS — D631 Anemia in chronic kidney disease: Secondary | ICD-10-CM | POA: Diagnosis not present

## 2016-06-01 DIAGNOSIS — N2581 Secondary hyperparathyroidism of renal origin: Secondary | ICD-10-CM | POA: Diagnosis not present

## 2016-06-01 DIAGNOSIS — E1129 Type 2 diabetes mellitus with other diabetic kidney complication: Secondary | ICD-10-CM | POA: Diagnosis not present

## 2016-06-01 DIAGNOSIS — D509 Iron deficiency anemia, unspecified: Secondary | ICD-10-CM | POA: Diagnosis not present

## 2016-06-02 DIAGNOSIS — E1129 Type 2 diabetes mellitus with other diabetic kidney complication: Secondary | ICD-10-CM | POA: Diagnosis not present

## 2016-06-02 DIAGNOSIS — N186 End stage renal disease: Secondary | ICD-10-CM | POA: Diagnosis not present

## 2016-06-02 DIAGNOSIS — D631 Anemia in chronic kidney disease: Secondary | ICD-10-CM | POA: Diagnosis not present

## 2016-06-02 DIAGNOSIS — D509 Iron deficiency anemia, unspecified: Secondary | ICD-10-CM | POA: Diagnosis not present

## 2016-06-02 DIAGNOSIS — N2581 Secondary hyperparathyroidism of renal origin: Secondary | ICD-10-CM | POA: Diagnosis not present

## 2016-06-04 DIAGNOSIS — D509 Iron deficiency anemia, unspecified: Secondary | ICD-10-CM | POA: Diagnosis not present

## 2016-06-04 DIAGNOSIS — N2581 Secondary hyperparathyroidism of renal origin: Secondary | ICD-10-CM | POA: Diagnosis not present

## 2016-06-04 DIAGNOSIS — N186 End stage renal disease: Secondary | ICD-10-CM | POA: Diagnosis not present

## 2016-06-04 DIAGNOSIS — D631 Anemia in chronic kidney disease: Secondary | ICD-10-CM | POA: Diagnosis not present

## 2016-06-04 DIAGNOSIS — E1129 Type 2 diabetes mellitus with other diabetic kidney complication: Secondary | ICD-10-CM | POA: Diagnosis not present

## 2016-06-06 DIAGNOSIS — N186 End stage renal disease: Secondary | ICD-10-CM | POA: Diagnosis not present

## 2016-06-06 DIAGNOSIS — I10 Essential (primary) hypertension: Secondary | ICD-10-CM | POA: Diagnosis not present

## 2016-06-06 DIAGNOSIS — E1342 Other specified diabetes mellitus with diabetic polyneuropathy: Secondary | ICD-10-CM | POA: Diagnosis not present

## 2016-06-06 DIAGNOSIS — Z89612 Acquired absence of left leg above knee: Secondary | ICD-10-CM | POA: Diagnosis not present

## 2016-06-06 DIAGNOSIS — E1165 Type 2 diabetes mellitus with hyperglycemia: Secondary | ICD-10-CM | POA: Diagnosis not present

## 2016-06-07 DIAGNOSIS — N2581 Secondary hyperparathyroidism of renal origin: Secondary | ICD-10-CM | POA: Diagnosis not present

## 2016-06-07 DIAGNOSIS — E1129 Type 2 diabetes mellitus with other diabetic kidney complication: Secondary | ICD-10-CM | POA: Diagnosis not present

## 2016-06-07 DIAGNOSIS — D631 Anemia in chronic kidney disease: Secondary | ICD-10-CM | POA: Diagnosis not present

## 2016-06-07 DIAGNOSIS — D509 Iron deficiency anemia, unspecified: Secondary | ICD-10-CM | POA: Diagnosis not present

## 2016-06-07 DIAGNOSIS — N186 End stage renal disease: Secondary | ICD-10-CM | POA: Diagnosis not present

## 2016-06-09 DIAGNOSIS — N2581 Secondary hyperparathyroidism of renal origin: Secondary | ICD-10-CM | POA: Diagnosis not present

## 2016-06-09 DIAGNOSIS — E1129 Type 2 diabetes mellitus with other diabetic kidney complication: Secondary | ICD-10-CM | POA: Diagnosis not present

## 2016-06-09 DIAGNOSIS — D631 Anemia in chronic kidney disease: Secondary | ICD-10-CM | POA: Diagnosis not present

## 2016-06-09 DIAGNOSIS — D509 Iron deficiency anemia, unspecified: Secondary | ICD-10-CM | POA: Diagnosis not present

## 2016-06-09 DIAGNOSIS — N186 End stage renal disease: Secondary | ICD-10-CM | POA: Diagnosis not present

## 2016-06-11 DIAGNOSIS — E1129 Type 2 diabetes mellitus with other diabetic kidney complication: Secondary | ICD-10-CM | POA: Diagnosis not present

## 2016-06-11 DIAGNOSIS — N2581 Secondary hyperparathyroidism of renal origin: Secondary | ICD-10-CM | POA: Diagnosis not present

## 2016-06-11 DIAGNOSIS — N186 End stage renal disease: Secondary | ICD-10-CM | POA: Diagnosis not present

## 2016-06-11 DIAGNOSIS — D631 Anemia in chronic kidney disease: Secondary | ICD-10-CM | POA: Diagnosis not present

## 2016-06-11 DIAGNOSIS — D509 Iron deficiency anemia, unspecified: Secondary | ICD-10-CM | POA: Diagnosis not present

## 2016-06-14 DIAGNOSIS — N186 End stage renal disease: Secondary | ICD-10-CM | POA: Diagnosis not present

## 2016-06-14 DIAGNOSIS — N2581 Secondary hyperparathyroidism of renal origin: Secondary | ICD-10-CM | POA: Diagnosis not present

## 2016-06-14 DIAGNOSIS — D509 Iron deficiency anemia, unspecified: Secondary | ICD-10-CM | POA: Diagnosis not present

## 2016-06-14 DIAGNOSIS — E1129 Type 2 diabetes mellitus with other diabetic kidney complication: Secondary | ICD-10-CM | POA: Diagnosis not present

## 2016-06-14 DIAGNOSIS — D631 Anemia in chronic kidney disease: Secondary | ICD-10-CM | POA: Diagnosis not present

## 2016-06-17 DIAGNOSIS — D631 Anemia in chronic kidney disease: Secondary | ICD-10-CM | POA: Diagnosis not present

## 2016-06-17 DIAGNOSIS — E1129 Type 2 diabetes mellitus with other diabetic kidney complication: Secondary | ICD-10-CM | POA: Diagnosis not present

## 2016-06-17 DIAGNOSIS — N186 End stage renal disease: Secondary | ICD-10-CM | POA: Diagnosis not present

## 2016-06-17 DIAGNOSIS — N2581 Secondary hyperparathyroidism of renal origin: Secondary | ICD-10-CM | POA: Diagnosis not present

## 2016-06-17 DIAGNOSIS — D509 Iron deficiency anemia, unspecified: Secondary | ICD-10-CM | POA: Diagnosis not present

## 2016-06-18 DIAGNOSIS — N186 End stage renal disease: Secondary | ICD-10-CM | POA: Diagnosis not present

## 2016-06-18 DIAGNOSIS — N2581 Secondary hyperparathyroidism of renal origin: Secondary | ICD-10-CM | POA: Diagnosis not present

## 2016-06-18 DIAGNOSIS — D509 Iron deficiency anemia, unspecified: Secondary | ICD-10-CM | POA: Diagnosis not present

## 2016-06-18 DIAGNOSIS — D631 Anemia in chronic kidney disease: Secondary | ICD-10-CM | POA: Diagnosis not present

## 2016-06-18 DIAGNOSIS — E1129 Type 2 diabetes mellitus with other diabetic kidney complication: Secondary | ICD-10-CM | POA: Diagnosis not present

## 2016-06-21 DIAGNOSIS — D631 Anemia in chronic kidney disease: Secondary | ICD-10-CM | POA: Diagnosis not present

## 2016-06-21 DIAGNOSIS — D509 Iron deficiency anemia, unspecified: Secondary | ICD-10-CM | POA: Diagnosis not present

## 2016-06-21 DIAGNOSIS — N2581 Secondary hyperparathyroidism of renal origin: Secondary | ICD-10-CM | POA: Diagnosis not present

## 2016-06-21 DIAGNOSIS — N186 End stage renal disease: Secondary | ICD-10-CM | POA: Diagnosis not present

## 2016-06-21 DIAGNOSIS — E1129 Type 2 diabetes mellitus with other diabetic kidney complication: Secondary | ICD-10-CM | POA: Diagnosis not present

## 2016-06-22 DIAGNOSIS — B351 Tinea unguium: Secondary | ICD-10-CM | POA: Diagnosis not present

## 2016-06-22 DIAGNOSIS — E1151 Type 2 diabetes mellitus with diabetic peripheral angiopathy without gangrene: Secondary | ICD-10-CM | POA: Diagnosis not present

## 2016-06-22 DIAGNOSIS — E1140 Type 2 diabetes mellitus with diabetic neuropathy, unspecified: Secondary | ICD-10-CM | POA: Diagnosis not present

## 2016-06-23 DIAGNOSIS — D631 Anemia in chronic kidney disease: Secondary | ICD-10-CM | POA: Diagnosis not present

## 2016-06-23 DIAGNOSIS — N186 End stage renal disease: Secondary | ICD-10-CM | POA: Diagnosis not present

## 2016-06-23 DIAGNOSIS — D509 Iron deficiency anemia, unspecified: Secondary | ICD-10-CM | POA: Diagnosis not present

## 2016-06-23 DIAGNOSIS — E1129 Type 2 diabetes mellitus with other diabetic kidney complication: Secondary | ICD-10-CM | POA: Diagnosis not present

## 2016-06-23 DIAGNOSIS — N2581 Secondary hyperparathyroidism of renal origin: Secondary | ICD-10-CM | POA: Diagnosis not present

## 2016-06-25 DIAGNOSIS — D509 Iron deficiency anemia, unspecified: Secondary | ICD-10-CM | POA: Diagnosis not present

## 2016-06-25 DIAGNOSIS — E1129 Type 2 diabetes mellitus with other diabetic kidney complication: Secondary | ICD-10-CM | POA: Diagnosis not present

## 2016-06-25 DIAGNOSIS — N2581 Secondary hyperparathyroidism of renal origin: Secondary | ICD-10-CM | POA: Diagnosis not present

## 2016-06-25 DIAGNOSIS — N186 End stage renal disease: Secondary | ICD-10-CM | POA: Diagnosis not present

## 2016-06-25 DIAGNOSIS — D631 Anemia in chronic kidney disease: Secondary | ICD-10-CM | POA: Diagnosis not present

## 2016-06-27 DIAGNOSIS — N186 End stage renal disease: Secondary | ICD-10-CM | POA: Diagnosis not present

## 2016-06-27 DIAGNOSIS — C642 Malignant neoplasm of left kidney, except renal pelvis: Secondary | ICD-10-CM | POA: Diagnosis not present

## 2016-06-27 DIAGNOSIS — R310 Gross hematuria: Secondary | ICD-10-CM | POA: Diagnosis not present

## 2016-06-27 DIAGNOSIS — N289 Disorder of kidney and ureter, unspecified: Secondary | ICD-10-CM | POA: Diagnosis not present

## 2016-06-27 DIAGNOSIS — C641 Malignant neoplasm of right kidney, except renal pelvis: Secondary | ICD-10-CM | POA: Diagnosis not present

## 2016-06-28 DIAGNOSIS — N186 End stage renal disease: Secondary | ICD-10-CM | POA: Diagnosis not present

## 2016-06-28 DIAGNOSIS — N2581 Secondary hyperparathyroidism of renal origin: Secondary | ICD-10-CM | POA: Diagnosis not present

## 2016-06-28 DIAGNOSIS — D631 Anemia in chronic kidney disease: Secondary | ICD-10-CM | POA: Diagnosis not present

## 2016-06-28 DIAGNOSIS — E1129 Type 2 diabetes mellitus with other diabetic kidney complication: Secondary | ICD-10-CM | POA: Diagnosis not present

## 2016-06-28 DIAGNOSIS — D509 Iron deficiency anemia, unspecified: Secondary | ICD-10-CM | POA: Diagnosis not present

## 2016-06-29 DIAGNOSIS — Z992 Dependence on renal dialysis: Secondary | ICD-10-CM | POA: Diagnosis not present

## 2016-06-29 DIAGNOSIS — E1129 Type 2 diabetes mellitus with other diabetic kidney complication: Secondary | ICD-10-CM | POA: Diagnosis not present

## 2016-06-29 DIAGNOSIS — N186 End stage renal disease: Secondary | ICD-10-CM | POA: Diagnosis not present

## 2016-06-30 DIAGNOSIS — D631 Anemia in chronic kidney disease: Secondary | ICD-10-CM | POA: Diagnosis not present

## 2016-06-30 DIAGNOSIS — D509 Iron deficiency anemia, unspecified: Secondary | ICD-10-CM | POA: Diagnosis not present

## 2016-06-30 DIAGNOSIS — R58 Hemorrhage, not elsewhere classified: Secondary | ICD-10-CM

## 2016-06-30 DIAGNOSIS — E1129 Type 2 diabetes mellitus with other diabetic kidney complication: Secondary | ICD-10-CM | POA: Diagnosis not present

## 2016-06-30 DIAGNOSIS — N2581 Secondary hyperparathyroidism of renal origin: Secondary | ICD-10-CM | POA: Diagnosis not present

## 2016-06-30 DIAGNOSIS — N186 End stage renal disease: Secondary | ICD-10-CM | POA: Diagnosis not present

## 2016-06-30 HISTORY — DX: Hemorrhage, not elsewhere classified: R58

## 2016-07-02 DIAGNOSIS — E1129 Type 2 diabetes mellitus with other diabetic kidney complication: Secondary | ICD-10-CM | POA: Diagnosis not present

## 2016-07-02 DIAGNOSIS — N2581 Secondary hyperparathyroidism of renal origin: Secondary | ICD-10-CM | POA: Diagnosis not present

## 2016-07-02 DIAGNOSIS — D631 Anemia in chronic kidney disease: Secondary | ICD-10-CM | POA: Diagnosis not present

## 2016-07-02 DIAGNOSIS — N186 End stage renal disease: Secondary | ICD-10-CM | POA: Diagnosis not present

## 2016-07-02 DIAGNOSIS — D509 Iron deficiency anemia, unspecified: Secondary | ICD-10-CM | POA: Diagnosis not present

## 2016-07-05 DIAGNOSIS — N186 End stage renal disease: Secondary | ICD-10-CM | POA: Diagnosis not present

## 2016-07-05 DIAGNOSIS — E1129 Type 2 diabetes mellitus with other diabetic kidney complication: Secondary | ICD-10-CM | POA: Diagnosis not present

## 2016-07-05 DIAGNOSIS — D631 Anemia in chronic kidney disease: Secondary | ICD-10-CM | POA: Diagnosis not present

## 2016-07-05 DIAGNOSIS — N2581 Secondary hyperparathyroidism of renal origin: Secondary | ICD-10-CM | POA: Diagnosis not present

## 2016-07-05 DIAGNOSIS — D509 Iron deficiency anemia, unspecified: Secondary | ICD-10-CM | POA: Diagnosis not present

## 2016-07-06 ENCOUNTER — Inpatient Hospital Stay (HOSPITAL_COMMUNITY): Payer: Medicare Other

## 2016-07-06 ENCOUNTER — Inpatient Hospital Stay (HOSPITAL_COMMUNITY)
Admission: EM | Admit: 2016-07-06 | Discharge: 2016-07-12 | DRG: 393 | Disposition: A | Discharge status: Home Health | Payer: Medicare Other | Attending: Internal Medicine | Admitting: Internal Medicine

## 2016-07-06 ENCOUNTER — Emergency Department (HOSPITAL_COMMUNITY): Payer: Medicare Other

## 2016-07-06 ENCOUNTER — Encounter (HOSPITAL_COMMUNITY): Payer: Self-pay | Admitting: *Deleted

## 2016-07-06 DIAGNOSIS — I10 Essential (primary) hypertension: Secondary | ICD-10-CM | POA: Diagnosis present

## 2016-07-06 DIAGNOSIS — E1165 Type 2 diabetes mellitus with hyperglycemia: Secondary | ICD-10-CM | POA: Diagnosis present

## 2016-07-06 DIAGNOSIS — N185 Chronic kidney disease, stage 5: Secondary | ICD-10-CM | POA: Diagnosis present

## 2016-07-06 DIAGNOSIS — I120 Hypertensive chronic kidney disease with stage 5 chronic kidney disease or end stage renal disease: Secondary | ICD-10-CM | POA: Diagnosis present

## 2016-07-06 DIAGNOSIS — Z87891 Personal history of nicotine dependence: Secondary | ICD-10-CM | POA: Diagnosis not present

## 2016-07-06 DIAGNOSIS — E039 Hypothyroidism, unspecified: Secondary | ICD-10-CM

## 2016-07-06 DIAGNOSIS — IMO0002 Reserved for concepts with insufficient information to code with codable children: Secondary | ICD-10-CM | POA: Diagnosis present

## 2016-07-06 DIAGNOSIS — I739 Peripheral vascular disease, unspecified: Secondary | ICD-10-CM | POA: Diagnosis present

## 2016-07-06 DIAGNOSIS — J189 Pneumonia, unspecified organism: Secondary | ICD-10-CM

## 2016-07-06 DIAGNOSIS — N2889 Other specified disorders of kidney and ureter: Secondary | ICD-10-CM | POA: Diagnosis not present

## 2016-07-06 DIAGNOSIS — Z452 Encounter for adjustment and management of vascular access device: Secondary | ICD-10-CM | POA: Diagnosis not present

## 2016-07-06 DIAGNOSIS — E1129 Type 2 diabetes mellitus with other diabetic kidney complication: Secondary | ICD-10-CM | POA: Diagnosis not present

## 2016-07-06 DIAGNOSIS — J440 Chronic obstructive pulmonary disease with acute lower respiratory infection: Secondary | ICD-10-CM | POA: Diagnosis present

## 2016-07-06 DIAGNOSIS — Z794 Long term (current) use of insulin: Secondary | ICD-10-CM | POA: Diagnosis not present

## 2016-07-06 DIAGNOSIS — R58 Hemorrhage, not elsewhere classified: Secondary | ICD-10-CM | POA: Diagnosis not present

## 2016-07-06 DIAGNOSIS — N186 End stage renal disease: Secondary | ICD-10-CM | POA: Diagnosis present

## 2016-07-06 DIAGNOSIS — K2970 Gastritis, unspecified, without bleeding: Secondary | ICD-10-CM | POA: Diagnosis not present

## 2016-07-06 DIAGNOSIS — D631 Anemia in chronic kidney disease: Secondary | ICD-10-CM | POA: Diagnosis present

## 2016-07-06 DIAGNOSIS — R05 Cough: Secondary | ICD-10-CM | POA: Diagnosis not present

## 2016-07-06 DIAGNOSIS — D62 Acute posthemorrhagic anemia: Secondary | ICD-10-CM | POA: Diagnosis not present

## 2016-07-06 DIAGNOSIS — N2581 Secondary hyperparathyroidism of renal origin: Secondary | ICD-10-CM | POA: Diagnosis present

## 2016-07-06 DIAGNOSIS — E1122 Type 2 diabetes mellitus with diabetic chronic kidney disease: Secondary | ICD-10-CM | POA: Diagnosis present

## 2016-07-06 DIAGNOSIS — D509 Iron deficiency anemia, unspecified: Secondary | ICD-10-CM | POA: Diagnosis present

## 2016-07-06 DIAGNOSIS — D508 Other iron deficiency anemias: Secondary | ICD-10-CM | POA: Diagnosis not present

## 2016-07-06 DIAGNOSIS — E1151 Type 2 diabetes mellitus with diabetic peripheral angiopathy without gangrene: Secondary | ICD-10-CM | POA: Diagnosis present

## 2016-07-06 DIAGNOSIS — Z992 Dependence on renal dialysis: Secondary | ICD-10-CM

## 2016-07-06 DIAGNOSIS — Z8673 Personal history of transient ischemic attack (TIA), and cerebral infarction without residual deficits: Secondary | ICD-10-CM | POA: Diagnosis not present

## 2016-07-06 DIAGNOSIS — N281 Cyst of kidney, acquired: Secondary | ICD-10-CM | POA: Diagnosis not present

## 2016-07-06 DIAGNOSIS — R109 Unspecified abdominal pain: Secondary | ICD-10-CM | POA: Diagnosis not present

## 2016-07-06 DIAGNOSIS — R918 Other nonspecific abnormal finding of lung field: Secondary | ICD-10-CM

## 2016-07-06 DIAGNOSIS — Z89512 Acquired absence of left leg below knee: Secondary | ICD-10-CM | POA: Diagnosis not present

## 2016-07-06 DIAGNOSIS — E785 Hyperlipidemia, unspecified: Secondary | ICD-10-CM | POA: Diagnosis present

## 2016-07-06 DIAGNOSIS — R1111 Vomiting without nausea: Secondary | ICD-10-CM | POA: Diagnosis not present

## 2016-07-06 DIAGNOSIS — R531 Weakness: Secondary | ICD-10-CM | POA: Diagnosis not present

## 2016-07-06 DIAGNOSIS — Z789 Other specified health status: Secondary | ICD-10-CM

## 2016-07-06 DIAGNOSIS — K661 Hemoperitoneum: Secondary | ICD-10-CM | POA: Diagnosis present

## 2016-07-06 DIAGNOSIS — K683 Retroperitoneal hematoma: Secondary | ICD-10-CM | POA: Diagnosis present

## 2016-07-06 DIAGNOSIS — R112 Nausea with vomiting, unspecified: Secondary | ICD-10-CM | POA: Diagnosis not present

## 2016-07-06 DIAGNOSIS — R059 Cough, unspecified: Secondary | ICD-10-CM

## 2016-07-06 DIAGNOSIS — J984 Other disorders of lung: Secondary | ICD-10-CM | POA: Diagnosis not present

## 2016-07-06 DIAGNOSIS — Z79899 Other long term (current) drug therapy: Secondary | ICD-10-CM

## 2016-07-06 HISTORY — DX: Hemorrhage, not elsewhere classified: R58

## 2016-07-06 LAB — GLUCOSE, CAPILLARY
GLUCOSE-CAPILLARY: 187 mg/dL — AB (ref 65–99)
GLUCOSE-CAPILLARY: 97 mg/dL (ref 65–99)
Glucose-Capillary: 102 mg/dL — ABNORMAL HIGH (ref 65–99)

## 2016-07-06 LAB — CBC WITH DIFFERENTIAL/PLATELET
Basophils Absolute: 0 10*3/uL (ref 0.0–0.1)
Basophils Relative: 0 %
Eosinophils Absolute: 0 10*3/uL (ref 0.0–0.7)
Eosinophils Relative: 0 %
HCT: 30.9 % — ABNORMAL LOW (ref 36.0–46.0)
HEMOGLOBIN: 9.5 g/dL — AB (ref 12.0–15.0)
LYMPHS ABS: 1.1 10*3/uL (ref 0.7–4.0)
LYMPHS PCT: 9 %
MCH: 23.8 pg — AB (ref 26.0–34.0)
MCHC: 30.7 g/dL (ref 30.0–36.0)
MCV: 77.4 fL — AB (ref 78.0–100.0)
Monocytes Absolute: 1.1 10*3/uL — ABNORMAL HIGH (ref 0.1–1.0)
Monocytes Relative: 9 %
NEUTROS ABS: 10.1 10*3/uL — AB (ref 1.7–7.7)
NEUTROS PCT: 82 %
Platelets: 188 10*3/uL (ref 150–400)
RBC: 3.99 MIL/uL (ref 3.87–5.11)
RDW: 20.3 % — ABNORMAL HIGH (ref 11.5–15.5)
WBC: 12.3 10*3/uL — AB (ref 4.0–10.5)

## 2016-07-06 LAB — CBC
HCT: 30.7 % — ABNORMAL LOW (ref 36.0–46.0)
Hemoglobin: 9.1 g/dL — ABNORMAL LOW (ref 12.0–15.0)
MCH: 22.6 pg — ABNORMAL LOW (ref 26.0–34.0)
MCHC: 29.6 g/dL — ABNORMAL LOW (ref 30.0–36.0)
MCV: 76.4 fL — ABNORMAL LOW (ref 78.0–100.0)
Platelets: 212 10*3/uL (ref 150–400)
RBC: 4.02 MIL/uL (ref 3.87–5.11)
RDW: 20.3 % — ABNORMAL HIGH (ref 11.5–15.5)
WBC: 13.2 10*3/uL — ABNORMAL HIGH (ref 4.0–10.5)

## 2016-07-06 LAB — BASIC METABOLIC PANEL
Anion gap: 17 — ABNORMAL HIGH (ref 5–15)
Anion gap: 13 (ref 5–15)
BUN: 53 mg/dL — ABNORMAL HIGH (ref 6–20)
BUN: 25 mg/dL — ABNORMAL HIGH (ref 6–20)
CHLORIDE: 93 mmol/L — AB (ref 101–111)
CO2: 25 mmol/L (ref 22–32)
CO2: 27 mmol/L (ref 22–32)
Calcium: 8.1 mg/dL — ABNORMAL LOW (ref 8.9–10.3)
Calcium: 8.6 mg/dL — ABNORMAL LOW (ref 8.9–10.3)
Chloride: 96 mmol/L — ABNORMAL LOW (ref 101–111)
Creatinine, Ser: 8.86 mg/dL — ABNORMAL HIGH (ref 0.44–1.00)
Creatinine, Ser: 4.9 mg/dL — ABNORMAL HIGH (ref 0.44–1.00)
GFR calc Af Amer: 5 mL/min — ABNORMAL LOW (ref >60)
GFR calc Af Amer: 9 mL/min — ABNORMAL LOW (ref >60)
GFR calc non Af Amer: 4 mL/min — ABNORMAL LOW (ref >60)
GFR calc non Af Amer: 8 mL/min — ABNORMAL LOW (ref >60)
Glucose, Bld: 184 mg/dL — ABNORMAL HIGH (ref 65–99)
Glucose, Bld: 98 mg/dL (ref 65–99)
POTASSIUM: 4.1 mmol/L (ref 3.5–5.1)
Potassium: 3.3 mmol/L — ABNORMAL LOW (ref 3.5–5.1)
SODIUM: 135 mmol/L (ref 135–145)
Sodium: 136 mmol/L (ref 135–145)

## 2016-07-06 LAB — IRON AND TIBC
Iron: 14 ug/dL — ABNORMAL LOW (ref 28–170)
Saturation Ratios: 7 % — ABNORMAL LOW (ref 10.4–31.8)
TIBC: 213 ug/dL — ABNORMAL LOW (ref 250–450)
UIBC: 199 ug/dL

## 2016-07-06 LAB — HEPATITIS B SURFACE ANTIGEN: Hepatitis B Surface Ag: NEGATIVE

## 2016-07-06 MED ORDER — ONDANSETRON 4 MG PO TBDP
4.0000 mg | ORAL_TABLET | Freq: Once | ORAL | Status: AC
  Administered 2016-07-06: 4 mg via ORAL
  Filled 2016-07-06: qty 1

## 2016-07-06 MED ORDER — INSULIN ASPART 100 UNIT/ML ~~LOC~~ SOLN
0.0000 [IU] | Freq: Three times a day (TID) | SUBCUTANEOUS | Status: DC
Start: 2016-07-06 — End: 2016-07-12
  Administered 2016-07-06 – 2016-07-08 (×3): 2 [IU] via SUBCUTANEOUS
  Administered 2016-07-08: 3 [IU] via SUBCUTANEOUS
  Administered 2016-07-08 – 2016-07-10 (×3): 2 [IU] via SUBCUTANEOUS
  Administered 2016-07-10 (×2): 3 [IU] via SUBCUTANEOUS
  Administered 2016-07-11: 2 [IU] via SUBCUTANEOUS
  Administered 2016-07-11: 3 [IU] via SUBCUTANEOUS
  Administered 2016-07-11 – 2016-07-12 (×2): 2 [IU] via SUBCUTANEOUS

## 2016-07-06 MED ORDER — CALCITRIOL 0.25 MCG PO CAPS
0.2500 ug | ORAL_CAPSULE | ORAL | Status: DC
  Administered 2016-07-07 – 2016-07-12 (×3): 0.25 ug via ORAL
  Filled 2016-07-06 (×3): qty 1

## 2016-07-06 MED ORDER — INSULIN ASPART 100 UNIT/ML ~~LOC~~ SOLN
0.0000 [IU] | Freq: Every day | SUBCUTANEOUS | Status: DC
  Administered 2016-07-08: 3 [IU] via SUBCUTANEOUS
  Administered 2016-07-10: 2 [IU] via SUBCUTANEOUS

## 2016-07-06 MED ORDER — FENTANYL CITRATE (PF) 100 MCG/2ML IJ SOLN
50.0000 ug | Freq: Once | INTRAMUSCULAR | Status: AC
  Administered 2016-07-06: 50 ug via RESPIRATORY_TRACT

## 2016-07-06 MED ORDER — FENTANYL CITRATE (PF) 100 MCG/2ML IJ SOLN
50.0000 ug | Freq: Once | INTRAMUSCULAR | Status: AC
  Administered 2016-07-06: 50 ug via NASAL
  Filled 2016-07-06: qty 2

## 2016-07-06 MED ORDER — LIDOCAINE HCL (PF) 2 % IJ SOLN
INTRAMUSCULAR | Status: AC
  Filled 2016-07-06: qty 10

## 2016-07-06 MED ORDER — ONDANSETRON HCL 4 MG/2ML IJ SOLN: 4.0000 mg | Freq: Four times a day (QID) | INTRAMUSCULAR | Status: DC | PRN

## 2016-07-06 MED ORDER — FENTANYL CITRATE (PF) 100 MCG/2ML IJ SOLN
50.0000 ug | Freq: Once | INTRAMUSCULAR | Status: AC
  Administered 2016-07-06: 50 ug via RESPIRATORY_TRACT
  Filled 2016-07-06: qty 2

## 2016-07-06 MED ORDER — ONDANSETRON 4 MG PO TBDP
4.0000 mg | ORAL_TABLET | Freq: Three times a day (TID) | ORAL | Status: DC | PRN
  Administered 2016-07-06: 4 mg via ORAL
  Filled 2016-07-06: qty 1

## 2016-07-06 MED ORDER — SODIUM CHLORIDE 0.9 % IV SOLN: 125.0000 mg | INTRAVENOUS | Status: DC

## 2016-07-06 MED ORDER — CALCIUM ACETATE (PHOS BINDER) 667 MG PO CAPS
1334.0000 mg | ORAL_CAPSULE | Freq: Three times a day (TID) | ORAL | Status: DC
  Administered 2016-07-07 – 2016-07-12 (×12): 1334 mg via ORAL
  Filled 2016-07-06 (×13): qty 2

## 2016-07-06 MED ORDER — SODIUM CHLORIDE 0.9 % IV SOLN
125.0000 mg | Freq: Once | INTRAVENOUS | Status: AC
  Administered 2016-07-06: 125 mg via INTRAVENOUS
  Filled 2016-07-06 (×2): qty 10

## 2016-07-06 MED ORDER — RENA-VITE PO TABS
1.0000 | ORAL_TABLET | Freq: Every day | ORAL | Status: DC
  Administered 2016-07-07 – 2016-07-11 (×5): 1 via ORAL
  Filled 2016-07-06 (×5): qty 1

## 2016-07-06 MED ORDER — FENTANYL CITRATE (PF) 100 MCG/2ML IJ SOLN
INTRAMUSCULAR | Status: AC
  Filled 2016-07-06: qty 2

## 2016-07-06 MED ORDER — SODIUM CHLORIDE 0.9 % IV SOLN: 125.0000 mg | Freq: Once | INTRAVENOUS | Status: DC

## 2016-07-06 MED ORDER — LIDOCAINE HCL (PF) 1 % IJ SOLN
INTRAMUSCULAR | Status: AC
  Filled 2016-07-06: qty 10

## 2016-07-06 NOTE — Progress Notes (Signed)
IR eval of pt transferred from [facility] to [facility] for large right retroperitoneal and pararenal hemorrhage.  Case reviewed with Dr. Kathlene Cote in case need for embolization arises. Urology also aware of pt. Pt is ESRD pt on HD, has failed LUE AVF and active (R)UE AVF. Is otherwise a poor venous access pt and currently has on IO IV. IR is also asked to place central venous line for better access. PMHx, meds, labs, imaging reviewed. [name] Jehovah witness BP (!) 135/52 (BP Location: Left Wrist)   Pulse 73   Temp 98.2 F (36.8 C) (Oral)   Resp 16   Ht '5\' 3"'$  (1.6 m)   Wt 170 lb (77.1 kg)   SpO2 100%   BMI 30.11 kg/m  Hemodynamically stable at this time. Last Hgb was 9.5, lab having difficulty getting blood draw for repeat  Discussed with pt need for central line placement. Explained procedure, risks, complications. Consent obtained.  Also briefly discussed possible need for renal arteriogram and embolization if she becomes unstable or concern that bleeding has not stopped.  Ascencion Dike PA-C Interventional Radiology 07/06/2016 12:16 PM

## 2016-07-06 NOTE — ED Notes (Signed)
Pt has not vomited since administration of zofran

## 2016-07-06 NOTE — ED Notes (Signed)
IV access attempted by Harrison Mons RN with no success.

## 2016-07-06 NOTE — Consult Note (Signed)
Urology Consult  Referring physician: Dillon Bjork Reason for referral: Retroperitoneal bleeding   Chief Complaint: Retroperitoneal bleeding  History of Present Illness: ESRD patient sent from [facility]. IR placing venous access; vitals normal and Hb 9.5 (baseline re 10.5); presented with right flank pain; no fever or chills; known renal mass seen by Dr Tresa Moore; Ct noted Large right pararenal and retroperitoneal hematoma likely arising from rupture of a a right renal lesion, possibly hemorrhagic cyst or solid mass. A ruptured renal cell carcinoma should be excluded. Both kidneys demonstrate multiple hemorrhagic and nonhemorrhagic cysts. No hydronephrosis. Multiple tiny low-attenuation nodules throughout the liver similar to prior study. This could represent metastasis, infectious process, regenerative nodules.  Right flank pain today; now feels nauseated; does not take blood thinners or ASA  Modifying factors: There are no other modifying factors  Associated signs and symptoms: There are no other associated signs and symptoms Aggravating and relieving factors: There are no other aggravating or relieving factors Severity: Moderate Duration: Persistent  Patient has multiple medical co-morbidities    Past Medical History:  Diagnosis Date  . Arthritis    knee  . Bronchitis   . COPD (chronic obstructive pulmonary disease) ([place])   . Diabetes mellitus   . Diarrhea    not constant- frequent  . ESRD on hemodialysis ([place])    [facility]. Started HD November 26, 2009. 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  Marland Kitchen Family history of anesthesia complication    SON- VOMITS  . Hyperlipidemia   . Hypertension   . Hypothyroidism   . Leg pain   . Peripheral vascular disease ([place])   . 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 Retroperitoneal bleeding 06/2016  . Stroke ([place])   . Thyroid disease    Past Surgical History:  Procedure Laterality Date  . AMPUTATION  Left 07/25/2012   Procedure: LEFT AMPUTATION BELOW KNEE;  Surgeon: Newt Minion, MD;  Location: [place];  Service: Orthopedics;  Laterality: Left;  Left Below Knee Amputation  . AV FISTULA PLACEMENT  05/18/2010  . BASCILIC VEIN TRANSPOSITION Right 11/23/2012   Procedure: RIGHT BASCILIC VEIN TRANSPOSITION ;  Surgeon: Angelia Mould, MD;  Location: [place];  Service: Vascular;  Laterality: Right;  . CHOLECYSTECTOMY    . DG AV DIALYSIS SHUNT ACCESS EXIST*R* OR     working right HD catheter  . FEMUR IM NAIL  05/20/2012   Procedure: INTRAMEDULLARY (IM) RETROGRADE FEMORAL NAILING;  Surgeon: Rozanna Box, MD;  Location: [place];  Service: Orthopedics;  Laterality: Left;    Medications: I have reviewed the patient's current medications. Allergies:  Allergies  Allergen Reactions  . Contrast Media [Iodinated Diagnostic Agents] Itching  . Latex Itching    Family History  Problem Relation Age of Onset  . Diabetes Mother    Social History:  reports that she quit smoking about 4 years ago. Her smoking use included Cigarettes. She has a 15.00 pack-year smoking history. She has never used smokeless tobacco. She reports that she does not drink alcohol or use drugs.  ROS: All systems are reviewed and negative except as noted. Rest negative  Physical Exam:  Vital signs in last 24 hours: Temp:  [98.2 F (36.8 C)-98.4 F (36.9 C)] 98.2 F (36.8 C) (02/07 1053) Pulse Rate:  [72-79] 73 (02/07 1053) Resp:  [16-21] 16 (02/07 1053) BP: (135-161)/(52-62) 135/52 (02/07 1053) SpO2:  [94 %-100 %] 100 % (02/07 1053) Weight:  [77.1  kg (170 lb)] 77.1 kg (170 lb) (02/07 0438)  Cardiovascular: Skin warm; not flushed Respiratory: Breaths quiet; no shortness of breath Abdomen: No masses Neurological: Normal sensation to touch Musculoskeletal: Normal motor function arms and legs Lymphatics: No inguinal adenopathy Skin: No rashes Genitourinary:non-toxic; mild abdominal tenderness  Laboratory Data:   Results for orders placed or performed during the hospital encounter of 07/06/16 (from the past 72 hour(s))  Basic metabolic panel     Status: Abnormal   Collection Time: 07/06/16  7:42 AM  Result Value Ref Range   Sodium 135 135 - 145 mmol/L   Potassium 4.1 3.5 - 5.1 mmol/L   Chloride 93 (L) 101 - 111 mmol/L   CO2 25 22 - 32 mmol/L   Glucose, Bld 184 (H) 65 - 99 mg/dL   BUN 53 (H) 6 - 20 mg/dL   Creatinine, Ser 8.86 (H) 0.44 - 1.00 mg/dL   Calcium 8.1 (L) 8.9 - 10.3 mg/dL   GFR calc non Af Amer 4 (L) >60 mL/min   GFR calc Af Amer 5 (L) >60 mL/min    Comment: (NOTE) The eGFR has been calculated using the CKD EPI equation. This calculation has not been validated in all clinical situations. eGFR's persistently <60 mL/min signify possible Chronic Kidney Disease.    Anion gap 17 (H) 5 - 15  CBC with Differential     Status: Abnormal   Collection Time: 07/06/16  7:42 AM  Result Value Ref Range   WBC 12.3 (H) 4.0 - 10.5 K/uL   RBC 3.99 3.87 - 5.11 MIL/uL   Hemoglobin 9.5 (L) 12.0 - 15.0 g/dL   HCT 30.9 (L) 36.0 - 46.0 %   MCV 77.4 (L) 78.0 - 100.0 fL   MCH 23.8 (L) 26.0 - 34.0 pg   MCHC 30.7 30.0 - 36.0 g/dL   RDW 20.3 (H) 11.5 - 15.5 %   Platelets 188 150 - 400 K/uL   Neutrophils Relative % 82 %   Neutro Abs 10.1 (H) 1.7 - 7.7 K/uL   Lymphocytes Relative 9 %   Lymphs Abs 1.1 0.7 - 4.0 K/uL   Monocytes Relative 9 %   Monocytes Absolute 1.1 (H) 0.1 - 1.0 K/uL   Eosinophils Relative 0 %   Eosinophils Absolute 0.0 0.0 - 0.7 K/uL   Basophils Relative 0 %   Basophils Absolute 0.0 0.0 - 0.1 K/uL  Glucose, capillary     Status: Abnormal   Collection Time: 07/06/16 10:58 AM  Result Value Ref Range   Glucose-Capillary 187 (H) 65 - 99 mg/dL   No results found for this or any previous visit (from the past 240 hour(s)). Creatinine:  Recent Labs  07/06/16 0742  CREATININE 8.86*    Xrays: See report/chart Reviewed CT  Impression/Assessment:  Retroperitoneal bleed likely  from right lower pole mass/cyst Not a good surgical candidate and known to Dr Tresa Moore  Plan:  Follow labs and vitals daily Will follow  Matei Magnone A 07/06/2016, 12:33 PM

## 2016-07-06 NOTE — H&P (Signed)
History and Physical    FELESHA MONCRIEFFE WUJ:[id number] DOB: Jun 03, 1945 DOA: 07/06/2016  PCP: [name], MD  Patient coming from: Home  Chief Complaint: Abdominal pain  HPI: [name] is a 71 y.o. female with medical history significant of ESRD on dialysis (on Tuesday, Thursday, Saturday) who only had a partial dialysis session yesterday, anemia of ESRD, HTN who presents with flank pain that woke patient from sleep. Pain 9/10 and patient felt the need to sit up.  Pain was constant and patient states she has has emesis associated with the pain.  No fever, no chills, no chest pain or pressure, no shortness of breath.  Pain is localized and does not radiate; patient reports pain is dull and constant.  No increase in pain with movement. CT was done in January for evaluation of her appendix.  She was found to have a kidney mass at that time and followed up with a urologist.  Says that the urologist told her that the mass was stable and no further intervention was necessary.    ED Course: patient underwent CT renal stone study which showed Large right pararenal and retroperitoneal hematoma likely arising from rupture of a a right renal lesion, possibly hemorrhagic cyst or solid mass. A ruptured renal cell carcinoma should be excluded. Both kidneys demonstrate multiple hemorrhagic and nonhemorrhagic cysts. No hydronephrosis. Multiple tiny low-attenuation nodules throughout the liver similar to prior study. This could represent metastasis, infectious process, regenerative nodules.   Review of Systems: As per HPI otherwise 10 point review of systems negative.    Past Medical History:  Diagnosis Date  . Arthritis    knee  . Bronchitis   . COPD (chronic obstructive pulmonary disease) ([place])   . Diabetes mellitus   . Diarrhea    not constant- frequent  . ESRD on hemodialysis ([place])    [facility]. Started HD November 26, 2009. 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  Marland Kitchen Family history of anesthesia complication    SON- VOMITS  . Hyperlipidemia   . Hypertension   . Hypothyroidism   . Leg pain   . Peripheral vascular disease ([place])   . 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 Stroke ([place])   . Thyroid disease     Past Surgical History:  Procedure Laterality Date  . AMPUTATION Left 07/25/2012   Procedure: LEFT AMPUTATION BELOW KNEE;  Surgeon: Newt Minion, MD;  Location: [place];  Service: Orthopedics;  Laterality: Left;  Left Below Knee Amputation  . AV FISTULA PLACEMENT  05/18/2010  . BASCILIC VEIN TRANSPOSITION Right 11/23/2012   Procedure: RIGHT BASCILIC VEIN TRANSPOSITION ;  Surgeon: Angelia Mould, MD;  Location: [place];  Service: Vascular;  Laterality: Right;  . CHOLECYSTECTOMY    . DG AV DIALYSIS SHUNT ACCESS EXIST*R* OR     working right HD catheter  . FEMUR IM NAIL  05/20/2012   Procedure: INTRAMEDULLARY (IM) RETROGRADE FEMORAL NAILING;  Surgeon: Rozanna Box, MD;  Location: [place];  Service: Orthopedics;  Laterality: Left;     reports that she quit smoking about 4 years ago. Her smoking use included Cigarettes. She has a 15.00 pack-year smoking history. She has never used smokeless tobacco. She reports that she does not drink alcohol or use drugs.  Allergies  Allergen Reactions  . Contrast Media [Iodinated Diagnostic Agents] Itching  . Latex Itching    Family History  Problem Relation Age of Onset  .  Diabetes Mother      Prior to Admission medications   Medication Sig Start Date End Date Taking? Authorizing Provider  acetaminophen (TYLENOL) 650 MG CR tablet Take 1,300 mg by mouth every 8 (eight) hours as needed for pain.   Yes Historical Provider, MD  amLODipine (NORVASC) 5 MG tablet Take 1 tablet (5 mg total) by mouth daily. 06/04/12  Yes Ripudeep Krystal Eaton, MD  benzonatate (TESSALON) 100 MG capsule Take 1 capsule (100 mg total) by mouth every 8 (eight) hours. 08/22/15  Yes Tanna Furry, MD  calcium acetate (PHOSLO) 667 MG  capsule Take 1 capsule (667 mg total) by mouth 2 (two) times daily with a meal. 06/04/12  Yes Ripudeep Krystal Eaton, MD  HYDROcodone-acetaminophen (NORCO/VICODIN) 5-325 MG per tablet Take 1 tablet by mouth every 6 (six) hours as needed for moderate pain. 05/20/14  Yes Carole Civil, MD  insulin aspart (NOVOLOG) 100 UNIT/ML injection Inject 0-15 Units into the skin every 4 (four) hours. CBG < 70: Drink juice; CBG 70 - 120: 0 units: CBG 121 - 150: 2 units; CBG 151 - 200: 3 units; CBG 201 - 250: 5 units; CBG 251 - 300: 8 units;CBG 301 - 350: 11 units; CBG 351 - 400: 15 units; CBG > 400 : 15 units and Call MD Patient taking differently: Inject 0-15 Units into the skin every 4 (four) hours as needed for high blood sugar. CBG < 70: Drink juice; CBG 70 - 120: 0 units: CBG 121 - 150: 2 units; CBG 151 - 200: 3 units; CBG 201 - 250: 5 units; CBG 251 - 300: 8 units;CBG 301 - 350: 11 units; CBG 351 - 400: 15 units; CBG > 400 : 15 units and Call MD 06/01/12  Yes Ripudeep Krystal Eaton, MD  levofloxacin (LEVAQUIN) 500 MG tablet Take 1 tablet (500 mg total) by mouth daily. 08/22/15  Yes Tanna Furry, MD  levothyroxine (SYNTHROID, LEVOTHROID) 75 MCG tablet Take 75 mcg by mouth daily.     Yes Historical Provider, MD  metoCLOPramide (REGLAN) 5 MG tablet Take 1-2 tablets (5-10 mg total) by mouth every 8 (eight) hours as needed (if ondansetron (ZOFRAN) ineffective.). 06/01/12  Yes Ripudeep Krystal Eaton, MD  multivitamin (RENA-VIT) TABS tablet Take 1 tablet by mouth daily.   Yes Historical Provider, MD  niacin (NIASPAN) 500 MG CR tablet Take 500 mg by mouth daily.    Yes Historical Provider, MD  ondansetron (ZOFRAN) 4 MG tablet Take 1 tablet (4 mg total) by mouth every 6 (six) hours as needed for nausea. 06/01/12  Yes Ripudeep Krystal Eaton, MD  oxyCODONE (ROXICODONE) 5 MG immediate release tablet Take 1 tablet (5 mg total) by mouth every 6 (six) hours as needed for pain. 11/23/12  Yes Gabriel Earing, PA-C    Physical Exam: Vitals:   07/06/16 0451 07/06/16  0600 07/06/16 0653 07/06/16 0700  BP: 155/57 (!) 152/53 147/61 159/58  Pulse: 79 72 72 72  Resp: '17 16 16 19  '$ Temp: 98.2 F (36.8 C)     TempSrc: Oral     SpO2: 97% 94% 97% 100%  Weight:      Height:          Constitutional: NAD, calm, comfortable Vitals:   07/06/16 0451 07/06/16 0600 07/06/16 0653 07/06/16 0700  BP: 155/57 (!) 152/53 147/61 159/58  Pulse: 79 72 72 72  Resp: '17 16 16 19  '$ Temp: 98.2 F (36.8 C)     TempSrc: Oral     SpO2:  97% 94% 97% 100%  Weight:      Height:       Eyes: PERRL, lids and conjunctivae normal ENMT: Mucous membranes are moist. Posterior pharynx clear of any exudate or lesions.Minimal dentition  Neck: normal, supple, no masses, no thyromegaly Respiratory: clear to auscultation bilaterally, no wheezing, no crackles. Normal respiratory effort. No accessory muscle use.  Cardiovascular: Regular rate and rhythm, no murmurs / rubs / gallops. No extremity edema. 2+ pedal pulses. No carotid bruits.  Abdomen: no tenderness, no masses palpated. No hepatosplenomegaly. Bowel sounds positive.  No CVA tenderness, no increased flank pain with palpation Musculoskeletal: no clubbing / cyanosis. Left BKA- well healed, right IO access present, fistula in right upper extremity with palpable thrill Skin: no rashes, lesions, ulcers. No induration Neurologic: CN 2-12 grossly intact. Sensation intact.  Psychiatric: Normal judgment and insight. Alert and oriented x 3. Normal mood.     Labs on Admission: I have personally reviewed following labs and imaging studies  CBC: No results for input(s): WBC, NEUTROABS, HGB, HCT, MCV, PLT in the last 168 hours. Basic Metabolic Panel: No results for input(s): NA, K, CL, CO2, GLUCOSE, BUN, CREATININE, CALCIUM, MG, PHOS in the last 168 hours. GFR: CrCl cannot be calculated (Patient's most recent lab result is older than the maximum 21 days allowed.). Liver Function Tests: No results for input(s): AST, ALT, ALKPHOS, BILITOT,  PROT, ALBUMIN in the last 168 hours. No results for input(s): LIPASE, AMYLASE in the last 168 hours. No results for input(s): AMMONIA in the last 168 hours. Coagulation Profile: No results for input(s): INR, PROTIME in the last 168 hours. Cardiac Enzymes: No results for input(s): CKTOTAL, CKMB, CKMBINDEX, TROPONINI in the last 168 hours. BNP (last 3 results) No results for input(s): PROBNP in the last 8760 hours. HbA1C: No results for input(s): HGBA1C in the last 72 hours. CBG: No results for input(s): GLUCAP in the last 168 hours. Lipid Profile: No results for input(s): CHOL, HDL, LDLCALC, TRIG, CHOLHDL, LDLDIRECT in the last 72 hours. Thyroid Function Tests: No results for input(s): TSH, T4TOTAL, FREET4, T3FREE, THYROIDAB in the last 72 hours. Anemia Panel: No results for input(s): VITAMINB12, FOLATE, FERRITIN, TIBC, IRON, RETICCTPCT in the last 72 hours. Urine analysis:    Component Value Date/Time   COLORURINE YELLOW 02/26/2012 [place] 02/26/2012 2345   LABSPEC 1.015 02/26/2012 2345   PHURINE 7.0 02/26/2012 2345   GLUCOSEU 250 (A) 02/26/2012 2345   HGBUR SMALL (A) 02/26/2012 2345   BILIRUBINUR NEGATIVE 02/26/2012 2345   KETONESUR NEGATIVE 02/26/2012 2345   PROTEINUR 100 (A) 02/26/2012 2345   UROBILINOGEN 0.2 02/26/2012 2345   NITRITE NEGATIVE 02/26/2012 2345   LEUKOCYTESUR SMALL (A) 02/26/2012 2345   Sepsis Labs: !!!!!!!!!!!!!!!!!!!!!!!!!!!!!!!!!!!!!!!!!!!! '@LABRCNTIP'$ (procalcitonin:4,lacticidven:4) )No results found for this or any previous visit (from the past 240 hour(s)).   Radiological Exams on Admission: Ct Renal Stone Study  Result Date: 07/06/2016 CLINICAL DATA:  Right flank pain starting tonight. Vomiting. Dialysis patient. EXAM: CT ABDOMEN AND PELVIS WITHOUT CONTRAST TECHNIQUE: Multidetector CT imaging of the abdomen and pelvis was performed following the standard protocol without IV contrast. COMPARISON:  06/27/2016 FINDINGS: Lower chest:  Calcified granuloma in the right hilum. Atelectasis in the lung bases. Coronary artery calcifications. Hepatobiliary: Multiple low-attenuation nodular parenchymal lesions demonstrated throughout the liver. This is nonspecific in could represent metastasis, infectious process, or regenerative nodules. Similar appearance to previous study. Gallbladder is surgically absent. Mild bile duct dilatation is normal for postoperative physiology. Pancreas: Unremarkable. No pancreatic  ductal dilatation or surrounding inflammatory changes. Spleen: Normal in size without focal abnormality. Adrenals/Urinary Tract: No adrenal gland nodules. Multiple bilateral renal lesions most consistent with cysts and hemorrhagic cysts. Bilateral renal atrophy. No hydronephrosis or hydroureter. Since the previous study, the right kidney has enlarged and has rotated to a horizontal position. There is a large hematoma that appears to be arising from the lower pole of the right kidney, extending into the anterior pararenal space and retroperitoneum. Hemorrhage extends along the right pericolic gutter. This would likely arise from rupture of a renal lesion, possibly a cyst or solid mass. Maximal measurement of the hematoma on the axial plane is 5.2 x 13 cm. Bladder wall is not thickened. Stomach/Bowel: Stomach is within normal limits. Appendix appears normal. No evidence of bowel wall thickening, distention, or inflammatory changes. Vascular/Lymphatic: Aortic calcification. A ectatic aorta without discrete aneurysm. Extensive calcification of iliac, external iliac, and common femoral arteries as well as internal iliac arteries. No significant lymphadenopathy. Reproductive: Calcifications in the uterus consistent with fibroids. No abnormal adnexal masses. Other: No free air in the abdomen. Abdominal wall musculature appears intact. Musculoskeletal: Degenerative changes in the spine. Postoperative change in the left proximal femur. No destructive bone  lesions. IMPRESSION: Large right pararenal and retroperitoneal hematoma likely arising from rupture of a a right renal lesion, possibly hemorrhagic cyst or solid mass. A ruptured renal cell carcinoma should be excluded. Both kidneys demonstrate multiple hemorrhagic and nonhemorrhagic cysts. No hydronephrosis. Multiple tiny low-attenuation nodules throughout the liver similar to prior study. This could represent metastasis, infectious process, regenerative nodules. These results were called by telephone at the time of interpretation on 07/06/2016 at 5:53 am to Dr. Rolland Porter , who verbally acknowledged these results. Electronically Signed   By: Lucienne Capers M.D.   On: 07/06/2016 06:01    EKG: Not done  Assessment/Plan Principal Problem:   Retroperitoneal bleeding Active Problems:   Diabetes mellitus type II, uncontrolled (HCC)   Iron deficiency anemia   Hypertension   Peripheral vascular disease (HCC)   RENAL DISEASE, CHRONIC, STAGE V    Retroperitoneal Bleed - IR and urology consulted by EDP - Urology will need to be called when patient arrives at [facility] - IR physician says to monitor patient, blood count; hoping bleeding will stop on its own - Vital signs stable at this time - holding anticoagulation  Renal Disease, Stage V, ESRD on Dialysis - patient only received half session of dialysis yesterday - Nephrology notified patient [facility] and consulted  Diabetes Mellitus Type II - SSI - HgA1c ordered - NPO for now  HTN - patient on amlodipine at home - keep NPO at this time - consider adding hydralazine PRN if necessary  Iron deficiency Anemia - likely from ESRD - patient is a Sales promotion account executive Witness and Does NOT accept blood products (verified with patient bedside) - monitor H/H q6h   Hypothyroidism - on Synthroid at home - restart Synthroid when patient is no longer NPO   DVT prophylaxis: none pending surgery Code Status: Full code Family Communication:  Patient son bedside Disposition Plan: pending stabilization and improvement Consults called: Urology, nephrology, Interventional Radiology Admission status: Inpatient, Telemetry   Loretha Stapler MD [facility] Pager [phone]  If 7PM-7AM, please contact night-coverage [email or web address] Password [email or web address]  07/06/2016, 7:42 AM

## 2016-07-06 NOTE — Progress Notes (Signed)
Receive pt from [name] with [facility], Pt is a left BKA and has an I/O in her right leg for IV access. Pt has poor venous access. Notified MD and pt will be receiving a central line placement tomorrow.

## 2016-07-06 NOTE — ED Provider Notes (Signed)
[facility] DEPT Provider Note   CSN: [id number] Arrival date & time: 07/06/16  0436  Time seen 04:44 AM   History   Chief Complaint Chief Complaint  Patient presents with  . Flank Pain    HPI [name] is a 71 y.o. female.  HPI  patient reports she was awakened from sleep about 3 AM this morning with right flank pain it's radiating into her right lower quadrant. She describes the pain as constant and aching. Nothing she does makes it feel worse, nothing she does makes it feel better. She has had nausea and vomited twice. She did have diarrhea yesterday once. She states she's never had this pain before. She does not have a family history of kidney stones. She states she can't sit still trying to get comfortable with the pain.  Patient has end-stage renal disease and gets dialysis on Tuesday (yesterday), Thursday, and Saturday. She goes to [facility], her nephrologist is Dr Florene Glen.   Past Medical History:  Diagnosis Date  . Arthritis    knee  . Bronchitis   . COPD (chronic obstructive pulmonary disease) ([place])   . Diabetes mellitus   . Diarrhea    not constant- frequent  . ESRD on hemodialysis ([place])    [facility]. Started HD November 26, 2009. 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  Marland Kitchen Family history of anesthesia complication    SON- VOMITS  . Hyperlipidemia   . Hypertension   . Hypothyroidism   . Leg pain   . Peripheral vascular disease ([place])   . 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 Stroke ([place])   . Thyroid disease     Patient Active Problem List   Diagnosis Date Noted  . Retroperitoneal bleeding 07/06/2016  . End stage renal disease ([place]) 11/21/2012  . Pre-operative cardiovascular examination 11/21/2012  . Ulcer of lower limb, unspecified 07/11/2012  . Atherosclerosis of native arteries of the extremities with ulceration(440.23) 07/11/2012  . Aspiration pneumonia ([place]) 05/31/2012  . ESRD (end stage renal disease)  ([place]) 05/30/2012  . Acute respiratory failure ([place]) 05/24/2012  . Severe sepsis ([place]) 05/24/2012  . Sepsis with metabolic encephalopathy ([place]) 05/24/2012  . Clostridium difficile colitis 05/24/2012  . Septic shock(785.52) 05/24/2012  . Fever 05/22/2012  . Femur fracture, left ([place]) 05/19/2012  . Fall at home 05/19/2012  . RENAL DISEASE, CHRONIC, STAGE V 07/29/2008  . ANEMIA OF RENAL FAILURE 04/14/2008  . UPPER RESPIRATORY INFECTION, VIRAL 04/02/2008  . ABDOMINAL PAIN 04/02/2008  . HYPERKERATOSIS 11/12/2007  . UNSPECIFIED DEBILITY 11/12/2007  . COUGH 10/03/2007  . RHINITIS 09/14/2007  . BACK PAIN, LUMBAR 09/14/2007  . CELLULITIS AND ABSCESS OF LEG EXCEPT FOOT 06/18/2007  . DIABETES MELLITUS, TYPE II, UNCONTROLLED, WITH COMPLICATIONS 20/25/4270  . SYNCOPE 04/09/2007  . LEG CRAMPS 04/02/2007  . ELECTROCARDIOGRAM, ABNORMAL 04/02/2007  . RENAL INSUFFICIENCY, ACUTE 02/16/2007  . EDEMA LEG 10/20/2006  . BRONCHITIS, ACUTE WITH BRONCHOSPASM 09/22/2006  . ROTATOR CUFF INJURY, LEFT SHOULDER 08/08/2006  . FIBROCYSTIC BREAST DISEASE 07/28/2006  . SHOULDER PAIN, LEFT 07/28/2006  . GOITER, MULTINODULAR 05/03/2006  . HYPERLIPIDEMIA 05/03/2006  . ANEMIA-IRON DEFICIENCY 05/03/2006  . SYNDROME, RESTLESS LEGS 05/03/2006  . HYPERTENSION 05/03/2006  . PERIPHERAL VASCULAR DISEASE 05/03/2006  . CONSTIPATION 05/03/2006  . ARTHRITIS 05/03/2006  . PROTEINURIA 05/03/2006  . DIVERTICULITIS, HX OF 05/03/2006    Past Surgical History:  Procedure Laterality Date  . AMPUTATION Left 07/25/2012   Procedure: LEFT AMPUTATION BELOW KNEE;  Surgeon: Newt Minion, MD;  Location: [facility];  Service: Orthopedics;  Laterality: Left;  Left Below Knee Amputation  . AV FISTULA PLACEMENT  05/18/2010  . BASCILIC VEIN TRANSPOSITION Right 11/23/2012   Procedure: RIGHT BASCILIC VEIN TRANSPOSITION ;  Surgeon: Angelia Mould, MD;  Location: [facility];  Service: Vascular;  Laterality: Right;  . CHOLECYSTECTOMY    . DG AV  DIALYSIS SHUNT ACCESS EXIST*R* OR     working right HD catheter  . FEMUR IM NAIL  05/20/2012   Procedure: INTRAMEDULLARY (IM) RETROGRADE FEMORAL NAILING;  Surgeon: Rozanna Box, MD;  Location: [facility];  Service: Orthopedics;  Laterality: Left;    OB History    Gravida Para Term Preterm AB Living             2   SAB TAB Ectopic Multiple Live Births                   Home Medications    Prior to Admission medications   Medication Sig Start Date End Date Taking? Authorizing Provider  acetaminophen (TYLENOL) 650 MG CR tablet Take 1,300 mg by mouth every 8 (eight) hours as needed for pain.   Yes Historical Provider, MD  amLODipine (NORVASC) 5 MG tablet Take 1 tablet (5 mg total) by mouth daily. 06/04/12  Yes Ripudeep Krystal Eaton, MD  benzonatate (TESSALON) 100 MG capsule Take 1 capsule (100 mg total) by mouth every 8 (eight) hours. 08/22/15  Yes Tanna Furry, MD  calcium acetate (PHOSLO) 667 MG capsule Take 1 capsule (667 mg total) by mouth 2 (two) times daily with a meal. 06/04/12  Yes Ripudeep Krystal Eaton, MD  HYDROcodone-acetaminophen (NORCO/VICODIN) 5-325 MG per tablet Take 1 tablet by mouth every 6 (six) hours as needed for moderate pain. 05/20/14  Yes Carole Civil, MD  insulin aspart (NOVOLOG) 100 UNIT/ML injection Inject 0-15 Units into the skin every 4 (four) hours. CBG < 70: Drink juice; CBG 70 - 120: 0 units: CBG 121 - 150: 2 units; CBG 151 - 200: 3 units; CBG 201 - 250: 5 units; CBG 251 - 300: 8 units;CBG 301 - 350: 11 units; CBG 351 - 400: 15 units; CBG > 400 : 15 units and Call MD Patient taking differently: Inject 0-15 Units into the skin every 4 (four) hours as needed for high blood sugar. CBG < 70: Drink juice; CBG 70 - 120: 0 units: CBG 121 - 150: 2 units; CBG 151 - 200: 3 units; CBG 201 - 250: 5 units; CBG 251 - 300: 8 units;CBG 301 - 350: 11 units; CBG 351 - 400: 15 units; CBG > 400 : 15 units and Call MD 06/01/12  Yes Ripudeep Krystal Eaton, MD  levofloxacin (LEVAQUIN) 500 MG tablet Take 1 tablet  (500 mg total) by mouth daily. 08/22/15  Yes Tanna Furry, MD  levothyroxine (SYNTHROID, LEVOTHROID) 75 MCG tablet Take 75 mcg by mouth daily.     Yes Historical Provider, MD  metoCLOPramide (REGLAN) 5 MG tablet Take 1-2 tablets (5-10 mg total) by mouth every 8 (eight) hours as needed (if ondansetron (ZOFRAN) ineffective.). 06/01/12  Yes Ripudeep Krystal Eaton, MD  multivitamin (RENA-VIT) TABS tablet Take 1 tablet by mouth daily.   Yes Historical Provider, MD  niacin (NIASPAN) 500 MG CR tablet Take 500 mg by mouth daily.    Yes Historical Provider, MD  ondansetron (ZOFRAN) 4 MG tablet Take 1 tablet (4 mg total) by mouth every 6 (six) hours as needed for nausea.  06/01/12  Yes Ripudeep Krystal Eaton, MD  oxyCODONE (ROXICODONE) 5 MG immediate release tablet Take 1 tablet (5 mg total) by mouth every 6 (six) hours as needed for pain. 11/23/12  Yes Gabriel Earing, PA-C    Family History Family History  Problem Relation Age of Onset  . Diabetes Mother     Social History Social History  Substance Use Topics  . Smoking status: Former Smoker    Packs/day: 0.50    Years: 30.00    Types: Cigarettes    Quit date: 04/29/2012  . Smokeless tobacco: Never Used  . Alcohol use No     Allergies   Contrast media [iodinated diagnostic agents] and Latex   Review of Systems Review of Systems  All other systems reviewed and are negative.    Physical Exam Updated Vital Signs BP 155/57 (BP Location: Left Arm)   Pulse 79   Temp 98.2 F (36.8 C) (Oral)   Resp 17   Ht '5\' 3"'$  (1.6 m)   Wt 170 lb (77.1 kg)   SpO2 97%   BMI 30.11 kg/m   Vital signs normal    Physical Exam  Constitutional: [name] oriented to person, place, and time. She appears well-developed and well-nourished.  Non-toxic appearance. She does not appear ill. No distress.  HENT:  Head: Normocephalic and atraumatic.  Right Ear: External ear normal.  Left Ear: External ear normal.  Nose: Nose normal. No mucosal edema or rhinorrhea.  Mouth/Throat:  Oropharynx is clear and moist and mucous membranes are normal. No dental abscesses or uvula swelling.  Eyes: Conjunctivae and EOM are normal. Pupils are equal, round, and reactive to light.  Pupils pinpoint bilaterally (states she puts drops in her eyes)  Neck: Normal range of motion and full passive range of motion without pain. Neck supple.  Cardiovascular: Normal rate, regular rhythm and normal heart sounds.  Exam reveals no gallop and no friction rub.   No murmur heard. Pulmonary/Chest: Effort normal and breath sounds normal. No respiratory distress. She has no wheezes. She has no rhonchi. She has no rales. She exhibits no tenderness and no crepitus.  Abdominal: Soft. Normal appearance and bowel sounds are normal. She exhibits no distension. There is no tenderness. There is no rebound and no guarding.  Pt indicates she has pain in her R flank and right abdomen but is nontender to palpation  Musculoskeletal: Normal range of motion. She exhibits no edema or tenderness.  Moves all extremities well.  Pt is s/p left BKA  Neurological: [name] alert and oriented to person, place, and time. She has normal strength. No cranial nerve deficit.  Skin: Skin is warm, dry and intact. No rash noted. No erythema. No pallor.  Psychiatric: She has a normal mood and affect. Her speech is normal and behavior is normal. Her mood appears not anxious.  Nursing note and vitals reviewed.    ED Treatments / Results  Labs (all labs ordered are listed, but only abnormal results are displayed) Labs Reviewed  BASIC METABOLIC PANEL  CBC WITH DIFFERENTIAL/PLATELET    EKG  EKG Interpretation None       Radiology Ct Renal Stone Study  Result Date: 07/06/2016 CLINICAL DATA:  Right flank pain starting tonight. Vomiting. Dialysis patient. EXAM: CT ABDOMEN AND PELVIS WITHOUT CONTRAST TECHNIQUE: Multidetector CT imaging of the abdomen and pelvis was performed following the standard protocol without IV contrast.  COMPARISON:  06/27/2016 FINDINGS: Lower chest: Calcified granuloma in the right hilum. Atelectasis in the lung  bases. Coronary artery calcifications. Hepatobiliary: Multiple low-attenuation nodular parenchymal lesions demonstrated throughout the liver. This is nonspecific in could represent metastasis, infectious process, or regenerative nodules. Similar appearance to previous study. Gallbladder is surgically absent. Mild bile duct dilatation is normal for postoperative physiology. Pancreas: Unremarkable. No pancreatic ductal dilatation or surrounding inflammatory changes. Spleen: Normal in size without focal abnormality. Adrenals/Urinary Tract: No adrenal gland nodules. Multiple bilateral renal lesions most consistent with cysts and hemorrhagic cysts. Bilateral renal atrophy. No hydronephrosis or hydroureter. Since the previous study, the right kidney has enlarged and has rotated to a horizontal position. There is a large hematoma that appears to be arising from the lower pole of the right kidney, extending into the anterior pararenal space and retroperitoneum. Hemorrhage extends along the right pericolic gutter. This would likely arise from rupture of a renal lesion, possibly a cyst or solid mass. Maximal measurement of the hematoma on the axial plane is 5.2 x 13 cm. Bladder wall is not thickened. Stomach/Bowel: Stomach is within normal limits. Appendix appears normal. No evidence of bowel wall thickening, distention, or inflammatory changes. Vascular/Lymphatic: Aortic calcification. A ectatic aorta without discrete aneurysm. Extensive calcification of iliac, external iliac, and common femoral arteries as well as internal iliac arteries. No significant lymphadenopathy. Reproductive: Calcifications in the uterus consistent with fibroids. No abnormal adnexal masses. Other: No free air in the abdomen. Abdominal wall musculature appears intact. Musculoskeletal: Degenerative changes in the spine. Postoperative change  in the left proximal femur. No destructive bone lesions. IMPRESSION: Large right pararenal and retroperitoneal hematoma likely arising from rupture of a a right renal lesion, possibly hemorrhagic cyst or solid mass. A ruptured renal cell carcinoma should be excluded. Both kidneys demonstrate multiple hemorrhagic and nonhemorrhagic cysts. No hydronephrosis. Multiple tiny low-attenuation nodules throughout the liver similar to prior study. This could represent metastasis, infectious process, regenerative nodules. These results were called by telephone at the time of interpretation on 07/06/2016 at 5:53 am to Dr. Rolland Porter , who verbally acknowledged these results. Electronically Signed   By: Lucienne Capers M.D.   On: 07/06/2016 06:01    Procedures IO LINE INSERTION Date/Time: 07/06/2016 7:39 AM Performed by: Tomi Bamberger, Finnlee Silvernail Authorized by: Rolland Porter   Consent:    Consent obtained:  Verbal   Consent given by:  Patient   Risks discussed:  Bleeding, infection, possible loss of function and incorrect placement   Alternatives discussed:  No treatment Pre-procedure details:    Site preparation:  Chlorhexidine Anesthesia (see MAR for exact dosages):    Anesthesia method:  Local infiltration Procedure details:    Insertion site:  R proximal tibia   Insertion device:  Drill device and 15 gauge IO needle   Insertion: Needle was inserted through the bony cortex     Number of attempts:  1   Insertion confirmation:  Aspiration of blood/marrow and stability of the needle Post-procedure details:    Secured with:  Protective shield   Patient tolerance of procedure: Patient had alot of pain when fluids flushed. Comments:     Patient was given fentanyl intranasally just prior to procedure. I used the leftover 2% Xylocaine without preservatives (used filter needle)  to locally infiltrate the skin to the periosteum, about 3 cc. The needle was inserted easily. Bone marrow was aspirated. I then inserted 2 cc of 2%  xylocaine without preservatives then flushed with 10 cc of saline. An addition 1 cc was infused and left in the line.    (including critical care time)  Medications Ordered in ED Medications  fentaNYL (SUBLIMAZE) injection 50 mcg (50 mcg Inhalation Given 07/06/16 0503)  ondansetron (ZOFRAN-ODT) disintegrating tablet 4 mg (4 mg Oral Given 07/06/16 0502)  fentaNYL (SUBLIMAZE) injection 50 mcg (50 mcg Nasal Given 07/06/16 7703)     Initial Impression / Assessment and Plan / ED Course  I have reviewed the triage vital signs and the nursing notes.  Pertinent labs & imaging results that were available during my care of the patient were reviewed by me and considered in my medical decision making (see chart for details).  Patient is difficult IV start, she was given fentanyl intranasally. Renal CT was ordered.  When I looked in her imaging and appendectomy her last CT scan was in June 2016.  5:55 AM radiologist called her CT report. He states she just had a CT done January 29 ordered by urologist Dr. Tresa Moore. This was for following a suspicious solid tumor on her right kidney which was getting larger. He states she now has a large retroperitoneal hemorrhage and some fluid around the right kidney. The retroperitoneal hemorrhage is about 5 x 13 cm in size. He states he thinks is either from a ruptured cyst or possibly from this solid tumor that has ruptured.  6:11 AM I suspected Dr. Louis Meckel, urologist. He states patient needs to be admitted, but he feels it should be to the hospitalist service due to her underlying medical conditions. He will see patient once she is transferred. Feels she should be admitted to [facility] and she will require dialysis. He also was noted to talk to IR about this patient. We discussed possible CT with contrast which may show if there is active bleeding still continuing.  06:26 AM Dr [place] Comment, hospitalist, states to do temp admission orders for [facility] for tele, Dr Marily Memos  07:30 AM Dr  Adair Patter, hospitlist seeing patient.   07:45 AM Dr Kathlene Cote informed about patient, will see patient if needed.  DR Louis Meckel WANTS TO BE CALLED WHEN PATIENT ARRIVES AT [facility]  Final Clinical Impressions(s) / ED Diagnoses   Final diagnoses:  Retroperitoneal bleeding  Renal mass, right  Renal cyst, right    Plan transfer to [facility], MD, Barbette Or, MD 07/06/16 [phone]

## 2016-07-06 NOTE — ED Notes (Signed)
[facility] at bedside 

## 2016-07-06 NOTE — Procedures (Signed)
  I was present at this dialysis session, have reviewed the session itself and made  appropriate changes Kelly Splinter MD [place] pager [phone]   07/06/2016, 3:09 PM

## 2016-07-06 NOTE — ED Notes (Signed)
Patient transported to CT 

## 2016-07-06 NOTE — Consult Note (Signed)
[facility] Renal Consultation Note    Indication for Consultation:  Management of ESRD/hemodialysis; anemia, hypertension/volume and secondary hyperparathyroidism PCP: [name], MD  Referring physician:  Grandville Silos, MD  HPI: [name] is a 71 y.o. female with ESRD (TTS [place]) secondary to DM/HTN on dialysis since June 2011, hx left BKA, prior known renal lesions followed by [facility] (Dr. Tresa Moore).  She had an acute onset of flank pain at 4 am.  She was seen at [facility] and transferred to [facility] for further evaluation and treatment.  CT showed large right parenal and retroperitoneal hematoma arising from a right renal lesion.  She has had vomiting and continues to be nauseated.  Yesterday she only stayed on dialysis 30 minutes due to acute diarrhea.  Her net UF was only 0.4 and she left 2.3 kg above EDW. The patient is Jehovah's Witness. She tells me she has been having coughing worse when lying down.  No CP or SOB.  She had an episode of acute diarrhea on dialysis yesterday and she had to leave early with only a brief treatment.  No further diarrhea since that time.    Her two brothers are with her today.  One of her son's was previously on dialysis elsewhere and died.  His son who is 75 lives with her and helps with her care. [name] very adherent to her dialysis regimen.  Past Medical History:  Diagnosis Date  . Arthritis    knee  . Bronchitis   . COPD (chronic obstructive pulmonary disease) ([place])   . Diabetes mellitus   . Diarrhea    not constant- frequent  . ESRD on hemodialysis ([place])    TTHSat [place] [facility]. Started HD November 26, 2009. 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  Marland Kitchen Family history of anesthesia complication    SON- VOMITS  . Hyperlipidemia   . Hypertension   . Hypothyroidism   . Leg pain   . Peripheral vascular disease ([place])   . 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 Retroperitoneal bleeding 06/2016   . Stroke ([place])   . Thyroid disease    Past Surgical History:  Procedure Laterality Date  . AMPUTATION Left 07/25/2012   Procedure: LEFT AMPUTATION BELOW KNEE;  Surgeon: Newt Minion, MD;  Location: [place];  Service: Orthopedics;  Laterality: Left;  Left Below Knee Amputation  . AV FISTULA PLACEMENT  05/18/2010  . BASCILIC VEIN TRANSPOSITION Right 11/23/2012   Procedure: RIGHT BASCILIC VEIN TRANSPOSITION ;  Surgeon: Angelia Mould, MD;  Location: [place];  Service: Vascular;  Laterality: Right;  . CHOLECYSTECTOMY    . DG AV DIALYSIS SHUNT ACCESS EXIST*R* OR     working right HD catheter  . FEMUR IM NAIL  05/20/2012   Procedure: INTRAMEDULLARY (IM) RETROGRADE FEMORAL NAILING;  Surgeon: Rozanna Box, MD;  Location: [place];  Service: Orthopedics;  Laterality: Left;   Family History  Problem Relation Age of Onset  . Diabetes Mother    Social History:  reports that she quit smoking about 4 years ago. Her smoking use included Cigarettes. She has a 15.00 pack-year smoking history. She has never used smokeless tobacco. She reports that she does not drink alcohol or use drugs. Allergies  Allergen Reactions  . Contrast Media [Iodinated Diagnostic Agents] Itching  . Latex Itching   Prior to Admission medications   Medication Sig Start Date End Date Taking? Authorizing Provider  acetaminophen (  TYLENOL) 650 MG CR tablet Take 1,300 mg by mouth every 8 (eight) hours as needed for pain.   Yes Historical Provider, MD  amLODipine (NORVASC) 5 MG tablet Take 1 tablet (5 mg total) by mouth daily. 06/04/12  Yes Ripudeep Krystal Eaton, MD  benzonatate (TESSALON) 100 MG capsule Take 1 capsule (100 mg total) by mouth every 8 (eight) hours. 08/22/15  Yes Tanna Furry, MD  calcium acetate (PHOSLO) 667 MG capsule Take 1 capsule (667 mg total) by mouth 2 (two) times daily with a meal. 06/04/12  Yes Ripudeep Krystal Eaton, MD  HYDROcodone-acetaminophen (NORCO/VICODIN) 5-325 MG per tablet Take 1 tablet by mouth every 6 (six) hours  as needed for moderate pain. 05/20/14  Yes Carole Civil, MD  insulin aspart (NOVOLOG) 100 UNIT/ML injection Inject 0-15 Units into the skin every 4 (four) hours. CBG < 70: Drink juice; CBG 70 - 120: 0 units: CBG 121 - 150: 2 units; CBG 151 - 200: 3 units; CBG 201 - 250: 5 units; CBG 251 - 300: 8 units;CBG 301 - 350: 11 units; CBG 351 - 400: 15 units; CBG > 400 : 15 units and Call MD Patient taking differently: Inject 0-15 Units into the skin every 4 (four) hours as needed for high blood sugar. CBG < 70: Drink juice; CBG 70 - 120: 0 units: CBG 121 - 150: 2 units; CBG 151 - 200: 3 units; CBG 201 - 250: 5 units; CBG 251 - 300: 8 units;CBG 301 - 350: 11 units; CBG 351 - 400: 15 units; CBG > 400 : 15 units and Call MD 06/01/12  Yes Ripudeep Krystal Eaton, MD  levofloxacin (LEVAQUIN) 500 MG tablet Take 1 tablet (500 mg total) by mouth daily. 08/22/15  Yes Tanna Furry, MD  levothyroxine (SYNTHROID, LEVOTHROID) 75 MCG tablet Take 75 mcg by mouth daily.     Yes Historical Provider, MD  metoCLOPramide (REGLAN) 5 MG tablet Take 1-2 tablets (5-10 mg total) by mouth every 8 (eight) hours as needed (if ondansetron (ZOFRAN) ineffective.). 06/01/12  Yes Ripudeep Krystal Eaton, MD  multivitamin (RENA-VIT) TABS tablet Take 1 tablet by mouth daily.   Yes Historical Provider, MD  niacin (NIASPAN) 500 MG CR tablet Take 500 mg by mouth daily.    Yes Historical Provider, MD  ondansetron (ZOFRAN) 4 MG tablet Take 1 tablet (4 mg total) by mouth every 6 (six) hours as needed for nausea. 06/01/12  Yes Ripudeep Krystal Eaton, MD  oxyCODONE (ROXICODONE) 5 MG immediate release tablet Take 1 tablet (5 mg total) by mouth every 6 (six) hours as needed for pain. 11/23/12  Yes Samantha J Rhyne, PA-C   Current Facility-Administered Medications  Medication Dose Route Frequency Provider Last Rate Last Dose  . insulin aspart (novoLOG) injection 0-5 Units  0-5 Units Subcutaneous QHS Eber Jones, MD      . insulin aspart (novoLOG) injection 0-9 Units  0-9  Units Subcutaneous TID WC Eber Jones, MD   2 Units at 07/06/16 1300  . lidocaine (XYLOCAINE) 2 % injection           . lidocaine (XYLOCAINE) 2 % injection           . ondansetron (ZOFRAN) injection 4 mg  4 mg Intravenous Q6H PRN Waldemar Dickens, MD      . ondansetron (ZOFRAN-ODT) disintegrating tablet 4 mg  4 mg Oral Q8H PRN Waldemar Dickens, MD   4 mg at 07/06/16 1259   Labs: Basic Metabolic Panel:  Recent Labs  Lab 07/06/16 0742  NA 135  K 4.1  CL 93*  CO2 25  GLUCOSE 184*  BUN 53*  CREATININE 8.86*  CALCIUM 8.1*  CBC:  Recent Labs Lab 07/06/16 0742  WBC 12.3*  NEUTROABS 10.1*  HGB 9.5*  HCT 30.9*  MCV 77.4*  PLT 188   CBG:  Recent Labs Lab 07/06/16 1058  GLUCAP 187*   Studies/Results: Ct Renal Stone Study  Result Date: 07/06/2016 CLINICAL DATA:  Right flank pain starting tonight. Vomiting. Dialysis patient. EXAM: CT ABDOMEN AND PELVIS WITHOUT CONTRAST TECHNIQUE: Multidetector CT imaging of the abdomen and pelvis was performed following the standard protocol without IV contrast. COMPARISON:  06/27/2016 FINDINGS: Lower chest: Calcified granuloma in the right hilum. Atelectasis in the lung bases. Coronary artery calcifications. Hepatobiliary: Multiple low-attenuation nodular parenchymal lesions demonstrated throughout the liver. This is nonspecific in could represent metastasis, infectious process, or regenerative nodules. Similar appearance to previous study. Gallbladder is surgically absent. Mild bile duct dilatation is normal for postoperative physiology. Pancreas: Unremarkable. No pancreatic ductal dilatation or surrounding inflammatory changes. Spleen: Normal in size without focal abnormality. Adrenals/Urinary Tract: No adrenal gland nodules. Multiple bilateral renal lesions most consistent with cysts and hemorrhagic cysts. Bilateral renal atrophy. No hydronephrosis or hydroureter. Since the previous study, the right kidney has enlarged and has rotated to a  horizontal position. There is a large hematoma that appears to be arising from the lower pole of the right kidney, extending into the anterior pararenal space and retroperitoneum. Hemorrhage extends along the right pericolic gutter. This would likely arise from rupture of a renal lesion, possibly a cyst or solid mass. Maximal measurement of the hematoma on the axial plane is 5.2 x 13 cm. Bladder wall is not thickened. Stomach/Bowel: Stomach is within normal limits. Appendix appears normal. No evidence of bowel wall thickening, distention, or inflammatory changes. Vascular/Lymphatic: Aortic calcification. A ectatic aorta without discrete aneurysm. Extensive calcification of iliac, external iliac, and common femoral arteries as well as internal iliac arteries. No significant lymphadenopathy. Reproductive: Calcifications in the uterus consistent with fibroids. No abnormal adnexal masses. Other: No free air in the abdomen. Abdominal wall musculature appears intact. Musculoskeletal: Degenerative changes in the spine. Postoperative change in the left proximal femur. No destructive bone lesions. IMPRESSION: Large right pararenal and retroperitoneal hematoma likely arising from rupture of a a right renal lesion, possibly hemorrhagic cyst or solid mass. A ruptured renal cell carcinoma should be excluded. Both kidneys demonstrate multiple hemorrhagic and nonhemorrhagic cysts. No hydronephrosis. Multiple tiny low-attenuation nodules throughout the liver similar to prior study. This could represent metastasis, infectious process, regenerative nodules. These results were called by telephone at the time of interpretation on 07/06/2016 at 5:53 am to Dr. Rolland Porter , who verbally acknowledged these results. Electronically Signed   By: Lucienne Capers M.D.   On: 07/06/2016 06:01    ROS: As per HPI otherwise negative.  Physical Exam: Vitals:   07/06/16 0730 07/06/16 0800 07/06/16 0920 07/06/16 1053  BP: 152/62 148/60 161/60 (!)  135/52  Pulse:   77 73  Resp: '17 21 19 16  '$ Temp:   98.4 F (36.9 C) 98.2 F (36.8 C)  TempSrc:   Oral Oral  SpO2:   100% 100%  Weight:      Height:         General: pleasant  elderly AAF uncomfortable Head: NCAT sclera not icteric MMM Neck: Supple.  Lungs:  Few crackles Breathing is unlabored. Heart: RRR with S1 S2.  Abdomen: soft  +  BS; tender along right flank Lower extremities: left BKA no edema, right 1+ edema no open wounds  Neuro: A & O  X 3. Moves all extremities spontaneously. Psych:  Responds to questions appropriately with a normal affect. Dialysis Access: right upper AVF  Dialysis Orders: [place] [facility] 3.75 hours 2k2.25 Ca 400/800 EDW 76 no heparin, right upper AVF calcitriol 0.25 Mircera 150 given 2/6 Recent labs: hgb 10.3 12% sat Jan, 15% Dec- s/o course of IV Fe for January ferritin 1365 iPTH 307 Ca/P ok  Assessment/Plan: 1. Right retroperitoneal bleed -seen by urology - hx of bilateral renal lesions - heparin has been held since 10/17 due to prolonged bleeding from access and never resumed;  Conservative management for now 2. ESRD -  TTS - off schedule - had only brief 30 min tmt yesterday 2/2 diarrhea - no further diarrhea since then - plan HD today 3. Hypertension/volume  - on norvasc 5 4. Anemia  - hgb 9.5 - this am expect to drop further due to RPB- recheck Fe studies with HD (she is s/p a course of Fe due to low tsat 12% - 150 Mircera given 2/6- Pt is JW- start weekly Fe 5. Metabolic bone disease -  Continue calcitriol and 2 phoslo resume tomorrow- controlled 6. Nutrition - npo for now advance as tol to renal carb mod/renavites resume tomorrow 7. DM - per primary  Myriam Jacobson, PA-C [place] [phone] 07/06/2016, 1:16 PM   Pt seen, examined and agree w A/P as above. 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  Urology is seeing, Hb relatively stable and hemodynamically stable.  Plan HD tonight.    Kelly Splinter MD [employer] pager [phone]   07/06/2016, 3:07 PM

## 2016-07-06 NOTE — Consult Note (Signed)
Urology Consult   Physician requesting consult: Dr. Ilene Qua  Reason for consult: right retroperitoneal hematoma  History of Present Illness: [name] is a 71 y.o. with a history of ESRD on Tu/Th/Sat HD who presented to the [facility] ED this AM with severe right flank pain and nausea/vomiting. She had never experienced any similar pain in the past. A CT scan in the ED showed a right pararenal and retroperitoneal hematoma likely secondary to ruptured hemorrhagic cyst or mass. She was hemodynamically stable and Hb was found to be 9.5. The patient was then transferred to [facility] There has been difficulty obtaining IV access and IR was consulted for placement of central line, in addition to possible need for embolization.  She has bilateral kidneys with known multiple cysts in addition to small bilateral solid masses (2.1cm, 1.1cm, 1.4cm on R, 1.5cm on L) for which she has been seen by Dr. Tresa Moore. Currently on surveillance with no interval change in the past two years. They had discussed plan for possible unilateral or bilateral nephrectomy if masses grew to >2.5-3cm. Had hematuria eval in 09/2014 due to one episode of gross hematuria after heavy NSAID use. Cysto showed no masses.  The patient no longer makes urine. She confirms that she would not want any blood products due to being a [employer]. Currently still has right flank pain that is dull and throbbing in nature. No fevers, chills, nausea, vomiting, lightheadeded, dizziness. No recent falls or trauma. Denies taking blood thinners. Long history of smoking.    Past Medical History:  Diagnosis Date  . Arthritis    knee  . Bronchitis   . COPD (chronic obstructive pulmonary disease) ([place])   . Diabetes mellitus   . Diarrhea    not constant- frequent  . ESRD on hemodialysis ([place])    TTHSat [place] [facility]. Started HD November 26, 2009. 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  Marland Kitchen Family history of anesthesia complication    SON- VOMITS  . Hyperlipidemia    . Hypertension   . Hypothyroidism   . Leg pain   . Peripheral vascular disease ([place])   . 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 Retroperitoneal bleeding 06/2016  . Stroke ([place])   . Thyroid disease     Past Surgical History:  Procedure Laterality Date  . AMPUTATION Left 07/25/2012   Procedure: LEFT AMPUTATION BELOW KNEE;  Surgeon: Newt Minion, MD;  Location: [place];  Service: Orthopedics;  Laterality: Left;  Left Below Knee Amputation  . AV FISTULA PLACEMENT  05/18/2010  . BASCILIC VEIN TRANSPOSITION Right 11/23/2012   Procedure: RIGHT BASCILIC VEIN TRANSPOSITION ;  Surgeon: Angelia Mould, MD;  Location: [place];  Service: Vascular;  Laterality: Right;  . CHOLECYSTECTOMY    . DG AV DIALYSIS SHUNT ACCESS EXIST*R* OR     working right HD catheter  . FEMUR IM NAIL  05/20/2012   Procedure: INTRAMEDULLARY (IM) RETROGRADE FEMORAL NAILING;  Surgeon: Rozanna Box, MD;  Location: [place];  Service: Orthopedics;  Laterality: Left;     Current Hospital Medications:  Scheduled Meds: . insulin aspart  0-5 Units Subcutaneous QHS  . insulin aspart  0-9 Units Subcutaneous TID WC  . lidocaine      . lidocaine       Continuous Infusions: PRN Meds:.  Allergies:  Allergies  Allergen Reactions  . Contrast Media [Iodinated Diagnostic Agents] Itching  . Latex Itching    Family  History  Problem Relation Age of Onset  . Diabetes Mother     Social History:  reports that she quit smoking about 4 years ago. Her smoking use included Cigarettes. She has a 15.00 pack-year smoking history. She has never used smokeless tobacco. She reports that she does not drink alcohol or use drugs.  ROS: A complete review of systems was performed.  All systems are negative except for pertinent findings as noted.  Physical Exam:  Vital signs in last 24 hours: Temp:  [98.2 F (36.8 C)-98.4 F (36.9 C)] 98.2 F (36.8 C) (02/07 1053) Pulse  Rate:  [72-79] 73 (02/07 1053) Resp:  [16-21] 16 (02/07 1053) BP: (135-161)/(52-62) 135/52 (02/07 1053) SpO2:  [94 %-100 %] 100 % (02/07 1053) Weight:  [77.1 kg (170 lb)] 77.1 kg (170 lb) (02/07 0438) Constitutional:  Alert and oriented, No acute distress Cardiovascular: Regular rate and rhythm  Respiratory: Normal respiratory effort on [place] GI: Abdomen is soft, nontender, nondistended  GU: Right CVA tenderness present.  Neurologic: Grossly intact, no focal deficits Psychiatric: Normal mood and affect Extremities: s/p left BKA.  Laboratory Data:   Recent Labs  07/06/16 0742  WBC 12.3*  HGB 9.5*  HCT 30.9*  PLT 188     Recent Labs  07/06/16 0742  NA 135  K 4.1  CL 93*  GLUCOSE 184*  BUN 53*  CALCIUM 8.1*  CREATININE 8.86*     Results for orders placed or performed during the hospital encounter of 07/06/16 (from the past 24 hour(s))  Basic metabolic panel     Status: Abnormal   Collection Time: 07/06/16  7:42 AM  Result Value Ref Range   Sodium 135 135 - 145 mmol/L   Potassium 4.1 3.5 - 5.1 mmol/L   Chloride 93 (L) 101 - 111 mmol/L   CO2 25 22 - 32 mmol/L   Glucose, Bld 184 (H) 65 - 99 mg/dL   BUN 53 (H) 6 - 20 mg/dL   Creatinine, Ser 8.86 (H) 0.44 - 1.00 mg/dL   Calcium 8.1 (L) 8.9 - 10.3 mg/dL   GFR calc non Af Amer 4 (L) >60 mL/min   GFR calc Af Amer 5 (L) >60 mL/min   Anion gap 17 (H) 5 - 15  CBC with Differential     Status: Abnormal   Collection Time: 07/06/16  7:42 AM  Result Value Ref Range   WBC 12.3 (H) 4.0 - 10.5 K/uL   RBC 3.99 3.87 - 5.11 MIL/uL   Hemoglobin 9.5 (L) 12.0 - 15.0 g/dL   HCT 30.9 (L) 36.0 - 46.0 %   MCV 77.4 (L) 78.0 - 100.0 fL   MCH 23.8 (L) 26.0 - 34.0 pg   MCHC 30.7 30.0 - 36.0 g/dL   RDW 20.3 (H) 11.5 - 15.5 %   Platelets 188 150 - 400 K/uL   Neutrophils Relative % 82 %   Neutro Abs 10.1 (H) 1.7 - 7.7 K/uL   Lymphocytes Relative 9 %   Lymphs Abs 1.1 0.7 - 4.0 K/uL   Monocytes Relative 9 %   Monocytes Absolute 1.1 (H)  0.1 - 1.0 K/uL   Eosinophils Relative 0 %   Eosinophils Absolute 0.0 0.0 - 0.7 K/uL   Basophils Relative 0 %   Basophils Absolute 0.0 0.0 - 0.1 K/uL  Glucose, capillary     Status: Abnormal   Collection Time: 07/06/16 10:58 AM  Result Value Ref Range   Glucose-Capillary 187 (H) 65 - 99 mg/dL   No results  found for this or any previous visit (from the past 240 hour(s)).  Renal Function:  Recent Labs  07/06/16 0742  CREATININE 8.86*   Estimated Creatinine Clearance: 5.8 mL/min (by C-G formula based on SCr of 8.86 mg/dL (H)).  Radiologic Imaging: Ct Renal Stone Study  Result Date: 07/06/2016 CLINICAL DATA:  Right flank pain starting tonight. Vomiting. Dialysis patient. EXAM: CT ABDOMEN AND PELVIS WITHOUT CONTRAST TECHNIQUE: Multidetector CT imaging of the abdomen and pelvis was performed following the standard protocol without IV contrast. COMPARISON:  06/27/2016 FINDINGS: Lower chest: Calcified granuloma in the right hilum. Atelectasis in the lung bases. Coronary artery calcifications. Hepatobiliary: Multiple low-attenuation nodular parenchymal lesions demonstrated throughout the liver. This is nonspecific in could represent metastasis, infectious process, or regenerative nodules. Similar appearance to previous study. Gallbladder is surgically absent. Mild bile duct dilatation is normal for postoperative physiology. Pancreas: Unremarkable. No pancreatic ductal dilatation or surrounding inflammatory changes. Spleen: Normal in size without focal abnormality. Adrenals/Urinary Tract: No adrenal gland nodules. Multiple bilateral renal lesions most consistent with cysts and hemorrhagic cysts. Bilateral renal atrophy. No hydronephrosis or hydroureter. Since the previous study, the right kidney has enlarged and has rotated to a horizontal position. There is a large hematoma that appears to be arising from the lower pole of the right kidney, extending into the anterior pararenal space and  retroperitoneum. Hemorrhage extends along the right pericolic gutter. This would likely arise from rupture of a renal lesion, possibly a cyst or solid mass. Maximal measurement of the hematoma on the axial plane is 5.2 x 13 cm. Bladder wall is not thickened. Stomach/Bowel: Stomach is within normal limits. Appendix appears normal. No evidence of bowel wall thickening, distention, or inflammatory changes. Vascular/Lymphatic: Aortic calcification. A ectatic aorta without discrete aneurysm. Extensive calcification of iliac, external iliac, and common femoral arteries as well as internal iliac arteries. No significant lymphadenopathy. Reproductive: Calcifications in the uterus consistent with fibroids. No abnormal adnexal masses. Other: No free air in the abdomen. Abdominal wall musculature appears intact. Musculoskeletal: Degenerative changes in the spine. Postoperative change in the left proximal femur. No destructive bone lesions. IMPRESSION: Large right pararenal and retroperitoneal hematoma likely arising from rupture of a a right renal lesion, possibly hemorrhagic cyst or solid mass. A ruptured renal cell carcinoma should be excluded. Both kidneys demonstrate multiple hemorrhagic and nonhemorrhagic cysts. No hydronephrosis. Multiple tiny low-attenuation nodules throughout the liver similar to prior study. This could represent metastasis, infectious process, regenerative nodules. These results were called by telephone at the time of interpretation on 07/06/2016 at 5:53 am to Dr. Rolland Porter , who verbally acknowledged these results. Electronically Signed   By: Lucienne Capers M.D.   On: 07/06/2016 06:01    I independently reviewed the above imaging studies.  Impression/Recommendation: 71 year old [place] witness with ESRD on HD who presents with right retroperitoneal hematoma likely secondary to spontaneous bleed from right renal mass or hemorrhagic cyst.   1) Retroperitoneal bleed - Recommend serial  hemoglobins every 6-8 hours for the first 24 hours. If evidence of dropping hemoglobin (i.e. Below 8) or hemodynamic instability would be aggressive with right renal arteriogram and embolization given that patient would not want blood transfusion. IR following - greatly appreciate their input.  Would keep on strict bedrest for 24 hours and avoid any anticoagulants. If hemoglobin stabilizes, then can increase activity tomorrow morning and decrease hemogblobin check to every 12 hours for an additional day.   2) Bilateral renal masses - Once acute bleed has resolved,  will likely continue active surveillance for bilateral renal masses. May reimage sooner in a few months once hematoma has resolved.  Pieter Partridge A Hana Trippett 07/06/2016, 12:20 PM

## 2016-07-06 NOTE — ED Triage Notes (Signed)
Pt arrived by EMS from home. Pt right flank pain that started tonight. Pt also vomiting when came to the ER.

## 2016-07-06 NOTE — Progress Notes (Signed)
Spoke with Dr. Tomi Bamberger about patient, retroperitoneal hematoma.  She has been hemodynamically stable.  I have suggested transfer to [facility] so that she is closer to nephrology/dialysis, urology, and interventional radiology.  She should be placed on bed rest until hgb stablizes.  IR should be consulted early so that they are are aware of this patient in the event that she needs to be embolized.  Please contact urology when patient arrives to [place].

## 2016-07-07 ENCOUNTER — Encounter (HOSPITAL_COMMUNITY): Payer: Self-pay | Admitting: Diagnostic Radiology

## 2016-07-07 ENCOUNTER — Inpatient Hospital Stay (HOSPITAL_COMMUNITY): Payer: Medicare Other

## 2016-07-07 DIAGNOSIS — Z992 Dependence on renal dialysis: Secondary | ICD-10-CM

## 2016-07-07 DIAGNOSIS — J189 Pneumonia, unspecified organism: Secondary | ICD-10-CM

## 2016-07-07 DIAGNOSIS — E039 Hypothyroidism, unspecified: Secondary | ICD-10-CM

## 2016-07-07 DIAGNOSIS — R58 Hemorrhage, not elsewhere classified: Secondary | ICD-10-CM

## 2016-07-07 DIAGNOSIS — D508 Other iron deficiency anemias: Secondary | ICD-10-CM

## 2016-07-07 DIAGNOSIS — E1122 Type 2 diabetes mellitus with diabetic chronic kidney disease: Secondary | ICD-10-CM

## 2016-07-07 DIAGNOSIS — Z794 Long term (current) use of insulin: Secondary | ICD-10-CM

## 2016-07-07 DIAGNOSIS — N186 End stage renal disease: Secondary | ICD-10-CM

## 2016-07-07 DIAGNOSIS — E1165 Type 2 diabetes mellitus with hyperglycemia: Secondary | ICD-10-CM

## 2016-07-07 DIAGNOSIS — D62 Acute posthemorrhagic anemia: Secondary | ICD-10-CM | POA: Diagnosis present

## 2016-07-07 DIAGNOSIS — I10 Essential (primary) hypertension: Secondary | ICD-10-CM

## 2016-07-07 HISTORY — PX: IR GENERIC HISTORICAL: IMG1180011

## 2016-07-07 LAB — CBC
HCT: 28 % — ABNORMAL LOW (ref 36.0–46.0)
HCT: 27.4 % — ABNORMAL LOW (ref 36.0–46.0)
HEMATOCRIT: 25.8 % — AB (ref 36.0–46.0)
HEMOGLOBIN: 7.9 g/dL — AB (ref 12.0–15.0)
HEMOGLOBIN: 7.5 g/dL — AB (ref 12.0–15.0)
Hemoglobin: 7.8 g/dL — ABNORMAL LOW (ref 12.0–15.0)
MCH: 21.9 pg — AB (ref 26.0–34.0)
MCH: 22.5 pg — ABNORMAL LOW (ref 26.0–34.0)
MCH: 22.2 pg — ABNORMAL LOW (ref 26.0–34.0)
MCHC: 28.2 g/dL — ABNORMAL LOW (ref 30.0–36.0)
MCHC: 29.1 g/dL — ABNORMAL LOW (ref 30.0–36.0)
MCHC: 28.5 g/dL — ABNORMAL LOW (ref 30.0–36.0)
MCV: 77.8 fL — ABNORMAL LOW (ref 78.0–100.0)
MCV: 77.5 fL — AB (ref 78.0–100.0)
MCV: 77.8 fL — ABNORMAL LOW (ref 78.0–100.0)
PLATELETS: 182 10*3/uL (ref 150–400)
Platelets: 185 10*3/uL (ref 150–400)
Platelets: 197 10*3/uL (ref 150–400)
RBC: 3.6 MIL/uL — AB (ref 3.87–5.11)
RBC: 3.33 MIL/uL — AB (ref 3.87–5.11)
RBC: 3.52 MIL/uL — ABNORMAL LOW (ref 3.87–5.11)
RDW: 20.3 % — ABNORMAL HIGH (ref 11.5–15.5)
RDW: 20.4 % — ABNORMAL HIGH (ref 11.5–15.5)
RDW: 20.4 % — ABNORMAL HIGH (ref 11.5–15.5)
WBC: 9.7 10*3/uL (ref 4.0–10.5)
WBC: 9.4 10*3/uL (ref 4.0–10.5)
WBC: 12.1 10*3/uL — ABNORMAL HIGH (ref 4.0–10.5)

## 2016-07-07 LAB — INFLUENZA PANEL BY PCR (TYPE A & B)
INFLBPCR: NEGATIVE
Influenza A By PCR: NEGATIVE

## 2016-07-07 LAB — COMPREHENSIVE METABOLIC PANEL
ALK PHOS: 60 U/L (ref 38–126)
ALT: 20 U/L (ref 14–54)
ANION GAP: 15 (ref 5–15)
AST: 20 U/L (ref 15–41)
Albumin: 2.9 g/dL — ABNORMAL LOW (ref 3.5–5.0)
BUN: 25 mg/dL — ABNORMAL HIGH (ref 6–20)
CALCIUM: 8 mg/dL — AB (ref 8.9–10.3)
CO2: 28 mmol/L (ref 22–32)
CREATININE: 6.17 mg/dL — AB (ref 0.44–1.00)
Chloride: 93 mmol/L — ABNORMAL LOW (ref 101–111)
GFR, EST AFRICAN AMERICAN: 7 mL/min — AB (ref >60)
GFR, EST NON AFRICAN AMERICAN: 6 mL/min — AB (ref >60)
Glucose, Bld: 113 mg/dL — ABNORMAL HIGH (ref 65–99)
Potassium: 4 mmol/L (ref 3.5–5.1)
SODIUM: 136 mmol/L (ref 135–145)
Total Bilirubin: 0.6 mg/dL (ref 0.3–1.2)
Total Protein: 6.4 g/dL — ABNORMAL LOW (ref 6.5–8.1)

## 2016-07-07 LAB — HEMOGLOBIN A1C
Hgb A1c MFr Bld: 6.2 % — ABNORMAL HIGH (ref 4.8–5.6)
Mean Plasma Glucose: 131 mg/dL

## 2016-07-07 LAB — RENAL FUNCTION PANEL
Albumin: 3.1 g/dL — ABNORMAL LOW (ref 3.5–5.0)
Anion gap: 15 (ref 5–15)
BUN: 11 mg/dL (ref 6–20)
CO2: 27 mmol/L (ref 22–32)
Calcium: 8.1 mg/dL — ABNORMAL LOW (ref 8.9–10.3)
Chloride: 94 mmol/L — ABNORMAL LOW (ref 101–111)
Creatinine, Ser: 3.66 mg/dL — ABNORMAL HIGH (ref 0.44–1.00)
GFR calc Af Amer: 13 mL/min — ABNORMAL LOW (ref >60)
GFR calc non Af Amer: 12 mL/min — ABNORMAL LOW (ref >60)
Glucose, Bld: 163 mg/dL — ABNORMAL HIGH (ref 65–99)
Phosphorus: 2.6 mg/dL (ref 2.5–4.6)
Potassium: 3.9 mmol/L (ref 3.5–5.1)
Sodium: 136 mmol/L (ref 135–145)

## 2016-07-07 LAB — TSH: TSH: 0.655 u[IU]/mL (ref 0.350–4.500)

## 2016-07-07 LAB — MRSA PCR SCREENING: MRSA by PCR: NEGATIVE

## 2016-07-07 LAB — GLUCOSE, CAPILLARY
GLUCOSE-CAPILLARY: 114 mg/dL — AB (ref 65–99)
GLUCOSE-CAPILLARY: 126 mg/dL — AB (ref 65–99)
Glucose-Capillary: 191 mg/dL — ABNORMAL HIGH (ref 65–99)

## 2016-07-07 MED ORDER — ALTEPLASE 2 MG IJ SOLR: 2.0000 mg | Freq: Once | INTRAMUSCULAR | Status: DC | PRN

## 2016-07-07 MED ORDER — LIDOCAINE HCL (PF) 1 % IJ SOLN: 5.0000 mL | INTRAMUSCULAR | Status: DC | PRN

## 2016-07-07 MED ORDER — PENTAFLUOROPROP-TETRAFLUOROETH EX AERO: 1.0000 "application " | INHALATION_SPRAY | CUTANEOUS | Status: DC | PRN

## 2016-07-07 MED ORDER — LIDOCAINE-PRILOCAINE 2.5-2.5 % EX CREA: 1.0000 "application " | TOPICAL_CREAM | CUTANEOUS | Status: DC | PRN

## 2016-07-07 MED ORDER — ALBUTEROL SULFATE (2.5 MG/3ML) 0.083% IN NEBU
2.5000 mg | INHALATION_SOLUTION | Freq: Four times a day (QID) | RESPIRATORY_TRACT | Status: DC
  Administered 2016-07-08 (×2): 2.5 mg via RESPIRATORY_TRACT
  Filled 2016-07-07 (×4): qty 3

## 2016-07-07 MED ORDER — ACETAMINOPHEN 650 MG RE SUPP: 650.0000 mg | Freq: Four times a day (QID) | RECTAL | Status: DC | PRN

## 2016-07-07 MED ORDER — SODIUM CHLORIDE 0.9% FLUSH
10.0000 mL | INTRAVENOUS | Status: DC | PRN
  Administered 2016-07-08: 20 mL
  Administered 2016-07-09 – 2016-07-10 (×2): 10 mL
  Filled 2016-07-07 (×3): qty 40

## 2016-07-07 MED ORDER — METOCLOPRAMIDE HCL 5 MG PO TABS
5.0000 mg | ORAL_TABLET | Freq: Three times a day (TID) | ORAL | Status: DC | PRN
  Filled 2016-07-07: qty 2

## 2016-07-07 MED ORDER — ZOLPIDEM TARTRATE 5 MG PO TABS: 5.0000 mg | ORAL_TABLET | Freq: Every evening | ORAL | Status: DC | PRN

## 2016-07-07 MED ORDER — HEPARIN SODIUM (PORCINE) 1000 UNIT/ML DIALYSIS: 1000.0000 [IU] | INTRAMUSCULAR | Status: DC | PRN

## 2016-07-07 MED ORDER — DEXTROSE 5 % IV SOLN
1.0000 g | INTRAVENOUS | Status: DC
  Administered 2016-07-07 – 2016-07-10 (×4): 1 g via INTRAVENOUS
  Filled 2016-07-07 (×5): qty 10

## 2016-07-07 MED ORDER — SODIUM CHLORIDE 0.9 % IV SOLN: 100.0000 mL | INTRAVENOUS | Status: DC | PRN

## 2016-07-07 MED ORDER — DOCUSATE SODIUM 283 MG RE ENEM
1.0000 | ENEMA | RECTAL | Status: DC | PRN
  Filled 2016-07-07: qty 1

## 2016-07-07 MED ORDER — DEXTROSE 5 % IV SOLN
500.0000 mg | INTRAVENOUS | Status: DC
  Administered 2016-07-07 – 2016-07-09 (×3): 500 mg via INTRAVENOUS
  Filled 2016-07-07 (×4): qty 500

## 2016-07-07 MED ORDER — SORBITOL 70 % SOLN
30.0000 mL | Status: DC | PRN
  Administered 2016-07-10: 30 mL via ORAL
  Filled 2016-07-07: qty 30

## 2016-07-07 MED ORDER — BENZONATATE 100 MG PO CAPS: 200.0000 mg | ORAL_CAPSULE | Freq: Three times a day (TID) | ORAL | Status: DC

## 2016-07-07 MED ORDER — LIDOCAINE HCL (PF) 1 % IJ SOLN
INTRAMUSCULAR | Status: AC
  Filled 2016-07-07: qty 10

## 2016-07-07 MED ORDER — OXYCODONE-ACETAMINOPHEN 5-325 MG PO TABS
1.0000 | ORAL_TABLET | Freq: Four times a day (QID) | ORAL | Status: DC | PRN
  Administered 2016-07-07 (×2): 1 via ORAL
  Filled 2016-07-07 (×2): qty 1

## 2016-07-07 MED ORDER — HYDROCODONE-HOMATROPINE 5-1.5 MG/5ML PO SYRP
5.0000 mL | ORAL_SOLUTION | Freq: Four times a day (QID) | ORAL | Status: DC | PRN
Start: 2016-07-07 — End: 2016-07-12
  Administered 2016-07-07 – 2016-07-11 (×6): 5 mL via ORAL
  Filled 2016-07-07 (×6): qty 5

## 2016-07-07 MED ORDER — ACETAMINOPHEN 325 MG PO TABS
650.0000 mg | ORAL_TABLET | Freq: Four times a day (QID) | ORAL | Status: DC | PRN
  Administered 2016-07-08 – 2016-07-09 (×2): 650 mg via ORAL
  Filled 2016-07-07: qty 2

## 2016-07-07 MED ORDER — ACETAMINOPHEN 325 MG PO TABS
ORAL_TABLET | ORAL | Status: AC
  Filled 2016-07-07: qty 1

## 2016-07-07 MED ORDER — LEVOTHYROXINE SODIUM 75 MCG PO TABS
75.0000 ug | ORAL_TABLET | Freq: Every day | ORAL | Status: DC
  Administered 2016-07-07 – 2016-07-11 (×5): 75 ug via ORAL
  Filled 2016-07-07 (×6): qty 1

## 2016-07-07 MED ORDER — RENAL PO TABS: 1.0000 | ORAL_TABLET | Freq: Every day | ORAL | Status: DC

## 2016-07-07 MED ORDER — HYDROXYZINE HCL 25 MG PO TABS: 25.0000 mg | ORAL_TABLET | Freq: Three times a day (TID) | ORAL | Status: DC | PRN

## 2016-07-07 MED ORDER — AMLODIPINE BESYLATE 5 MG PO TABS
5.0000 mg | ORAL_TABLET | Freq: Every day | ORAL | Status: DC
  Administered 2016-07-07: 5 mg via ORAL
  Filled 2016-07-07 (×3): qty 1

## 2016-07-07 MED ORDER — ONDANSETRON HCL 4 MG PO TABS: 4.0000 mg | ORAL_TABLET | Freq: Four times a day (QID) | ORAL | Status: DC | PRN

## 2016-07-07 MED ORDER — LIDOCAINE HCL 1 % IJ SOLN
INTRAMUSCULAR | Status: DC | PRN
  Administered 2016-07-07: 10 mL

## 2016-07-07 MED ORDER — GUAIFENESIN ER 600 MG PO TB12
1200.0000 mg | ORAL_TABLET | Freq: Two times a day (BID) | ORAL | Status: DC
  Administered 2016-07-07 – 2016-07-12 (×10): 1200 mg via ORAL
  Filled 2016-07-07 (×11): qty 2

## 2016-07-07 MED ORDER — CAMPHOR-MENTHOL 0.5-0.5 % EX LOTN: 1.0000 "application " | TOPICAL_LOTION | Freq: Three times a day (TID) | CUTANEOUS | Status: DC | PRN

## 2016-07-07 MED ORDER — SODIUM CHLORIDE 0.9 % IV SOLN
250.0000 mg | INTRAVENOUS | Status: DC
  Administered 2016-07-09 – 2016-07-12 (×2): 250 mg via INTRAVENOUS
  Filled 2016-07-07 (×4): qty 20

## 2016-07-07 MED ORDER — ONDANSETRON HCL 4 MG/2ML IJ SOLN: 4.0000 mg | Freq: Four times a day (QID) | INTRAMUSCULAR | Status: DC | PRN

## 2016-07-07 MED ORDER — CALCIUM CARBONATE ANTACID 1250 MG/5ML PO SUSP
500.0000 mg | Freq: Four times a day (QID) | ORAL | Status: DC | PRN
  Filled 2016-07-07: qty 5

## 2016-07-07 MED ORDER — NEPRO/CARBSTEADY PO LIQD: 237.0000 mL | Freq: Three times a day (TID) | ORAL | Status: DC | PRN

## 2016-07-07 MED ORDER — ALBUTEROL SULFATE (2.5 MG/3ML) 0.083% IN NEBU: 2.5000 mg | INHALATION_SOLUTION | RESPIRATORY_TRACT | Status: DC | PRN

## 2016-07-07 MED ORDER — RENA-VITE PO TABS
1.0000 | ORAL_TABLET | Freq: Every day | ORAL | Status: DC
  Filled 2016-07-07: qty 1

## 2016-07-07 NOTE — Progress Notes (Signed)
PROGRESS NOTE    [name]  OJJ:[id number] DOB: 1945-12-09 DOA: 07/06/2016 PCP: [name], MD    Brief Narrative:  [name] is a 71 y.o. female with medical history significant of ESRD on dialysis (on Tuesday, Thursday, Saturday) who only had a partial dialysis session yesterday, anemia of ESRD, HTN who presents with flank pain that woke patient from sleep. Pain 9/10 and patient felt the need to sit up.  Pain was constant and patient states she has has emesis associated with the pain.  No fever, no chills, no chest pain or pressure, no shortness of breath.  Pain is localized and does not radiate; patient reports pain is dull and constant.  No increase in pain with movement. CT was done in January for evaluation of her appendix.  She was found to have a kidney mass at that time and followed up with a urologist.  Says that the urologist told her that the mass was stable and no further intervention was necessary.    ED Course: patient underwent CT renal stone study which showed Large right pararenal and retroperitoneal hematoma likely arising from rupture of a a right renal lesion, possibly hemorrhagic cyst or solid mass. A ruptured renal cell carcinoma should be excluded. Both kidneys demonstrate multiple hemorrhagic and nonhemorrhagic cysts. No hydronephrosis. Multiple tiny low-attenuation nodules throughout the liver similar to prior study. This could represent metastasis, infectious process, regenerative nodules   Assessment & Plan:   Principal Problem:   Retroperitoneal bleed Active Problems:   Diabetes mellitus type II, uncontrolled (HCC)   Iron deficiency anemia   Hypertension   Peripheral vascular disease (HCC)   RENAL DISEASE, CHRONIC, STAGE V   ESRD (end stage renal disease) (HCC)   Retroperitoneal bleeding   Acute blood loss anemia   Community acquired pneumonia of left lung   Hypothyroidism  #1 retroperitoneal bleed Per urology likely from the right lower pole  mass/cyst. Per urology patient not a good surgical candidate and recommended conservative treatment at this time. Hemoglobin trending down and currently at 7.5 from 9.5 on admission. Patient is a Jehovah witness and does not receive blood transfusions. Patient recently given IV iron. Follow H&H. Per urology.  #2 end-stage renal disease On hemodialysis Tuesdays Thursdays Saturdays. Patient on the received a brief 30 minutes treatment prior to admission secondary to diarrhea. No further diarrhea. Patient received hemodialysis yesterday and to receive hemodialysis today.  #3 acute blood loss anemia/anemia of chronic disease Secondary to problem #1. Patient received IV iron on 07/06/2015. Patient to get weekly IV iron per nephrology. Follow H&H. Hemoglobin currently at 7.5 from 9.5 on admission. Patient is a Sales promotion account executive Witness and does not receive blood transfusions.  #4 hypertension Stable. Continue Norvasc.  #5 cough/probable pneumonia Patient noted to have a productive cough per nursing. Chest x-ray obtained with left suprahilar pneumonia versus mass. Check a sputum Gram stain and culture. Check a urine Legionella antigen. Check a urine pneumococcus antigen. Placed empirically on IV Rocephin and azithromycin. Mucinex twice a day. Nebulizer treatments. Follow.  #6 diabetes mellitus type 2 Hemoglobin A1c 6.21 07/06/2016. CBGs ranging from 97-191. Sliding scale insulin.  #7 hypothyroidism Check a TSH. Resume home dose Synthroid.   DVT prophylaxis: SCDs Code Status: Full Family Communication: Updated patient. No family at bedside. Disposition Plan: Home once hematoma has improved, hemoglobin stabilized, per urology and nephrology.   Consultants:   Nephrology: Dr.Schertz 07/06/2016  Urology: Dr. Matilde Sprang 07/06/2016  Interventional radiology: Dr. Kathlene Cote  Procedures:   CT  stone protocol 07/06/2016.  Chest x-ray 07/07/2016  Fluoroscopy guided left jugular central line per Dr.Henn  07/07/2016   Antimicrobials:   None   Subjective: Patient c/o right leg pain where interooseous IV was. Patient denies CP. No shortness of breath. Some improvement with back pain. Per nursing patient complaining of productive cough.  Objective: Vitals:   07/06/16 2107 07/07/16 0535 07/07/16 1609 07/07/16 1618  BP: (!) 109/48 (!) 112/49 (!) 131/56 (!) 149/61  Pulse: 76 80 80 77  Resp: '17 18 10   '$ Temp: 99.2 F (37.3 C) 98.7 F (37.1 C) 97.8 F (36.6 C)   TempSrc: Oral Oral    SpO2: 100% 100%    Weight: 80.2 kg (176 lb 12.9 oz)  79 kg (174 lb 2.6 oz)   Height:        Intake/Output Summary (Last 24 hours) at 07/07/16 1648 Last data filed at 07/07/16 0600  Gross per 24 hour  Intake                0 ml  Output              825 ml  Net             -825 ml   Filed Weights   07/06/16 1815 07/06/16 2107 07/07/16 1609  Weight: 77.2 kg (170 lb 3.1 oz) 80.2 kg (176 lb 12.9 oz) 79 kg (174 lb 2.6 oz)    Examination:  General exam: Appears calm and comfortable  Respiratory system: Clear to auscultation. Respiratory effort normal. Cardiovascular system: S1 & S2 heard, RRR. No JVD, murmurs, rubs, gallops or clicks. No pedal edema. Gastrointestinal system: Abdomen is nondistended, soft and nontender. No organomegaly or masses felt. Normal bowel sounds heard. Central nervous system: Alert and oriented. No focal neurological deficits. Extremities: Symmetric 5 x 5 power. Skin: No rashes, lesions or ulcers Psychiatry: Judgement and insight appear normal. Mood & affect appropriate.     Data Reviewed: I have personally reviewed following labs and imaging studies  CBC:  Recent Labs Lab 07/06/16 0742 07/06/16 1602 07/07/16 0236 07/07/16 1051  WBC 12.3* 13.2* 9.7 9.4  NEUTROABS 10.1*  --   --   --   HGB 9.5* 9.1* 7.9* 7.5*  HCT 30.9* 30.7* 28.0* 25.8*  MCV 77.4* 76.4* 77.8* 77.5*  PLT 188 212 182 409   Basic Metabolic Panel:  Recent Labs Lab 07/06/16 0742 07/06/16 1602  07/07/16 0236  NA 135 136 136  K 4.1 3.3* 4.0  CL 93* 96* 93*  CO2 '25 27 28  '$ GLUCOSE 184* 98 113*  BUN 53* 25* 25*  CREATININE 8.86* 4.90* 6.17*  CALCIUM 8.1* 8.6* 8.0*   GFR: Estimated Creatinine Clearance: 8.4 mL/min (by C-G formula based on SCr of 6.17 mg/dL (H)). Liver Function Tests:  Recent Labs Lab 07/07/16 0236  AST 20  ALT 20  ALKPHOS 60  BILITOT 0.6  PROT 6.4*  ALBUMIN 2.9*   No results for input(s): LIPASE, AMYLASE in the last 168 hours. No results for input(s): AMMONIA in the last 168 hours. Coagulation Profile: No results for input(s): INR, PROTIME in the last 168 hours. Cardiac Enzymes: No results for input(s): CKTOTAL, CKMB, CKMBINDEX, TROPONINI in the last 168 hours. BNP (last 3 results) No results for input(s): PROBNP in the last 8760 hours. HbA1C:  Recent Labs  07/06/16 1256  HGBA1C 6.2*   CBG:  Recent Labs Lab 07/06/16 1058 07/06/16 1859 07/06/16 2110 07/07/16 0815 07/07/16 1225  GLUCAP 187* 102* 97  114* 191*   Lipid Profile: No results for input(s): CHOL, HDL, LDLCALC, TRIG, CHOLHDL, LDLDIRECT in the last 72 hours. Thyroid Function Tests: No results for input(s): TSH, T4TOTAL, FREET4, T3FREE, THYROIDAB in the last 72 hours. Anemia Panel:  Recent Labs  07/06/16 1516  TIBC 213*  IRON 14*   Sepsis Labs: No results for input(s): PROCALCITON, LATICACIDVEN in the last 168 hours.  Recent Results (from the past 240 hour(s))  MRSA PCR Screening     Status: None   Collection Time: 07/07/16  8:21 AM  Result Value Ref Range Status   MRSA by PCR NEGATIVE NEGATIVE Final    Comment:        The GeneXpert MRSA Assay (FDA approved for NASAL specimens only), is one component of a comprehensive MRSA colonization surveillance program. It is not intended to diagnose MRSA infection nor to guide or monitor treatment for MRSA infections.          Radiology Studies: Dg Chest 2 View  Result Date: 07/07/2016 CLINICAL DATA:  Productive  cough. EXAM: CHEST  2 VIEW COMPARISON:  08/22/2015 FINDINGS: Left suprahilar opacity with rounded appearance. Indistinct right infrahilar opacity with mild volume loss. No edema, effusion, or pneumothorax. Normal heart size and mediastinal contours. Left IJ central line with tip at the SVC level. IMPRESSION: 1. Left suprahilar pneumonia versus mass. Recommend short follow-up chest x-ray or chest CT. 2. Mild atelectasis and pleural fluid on the right. Electronically Signed   By: Monte Fantasia M.D.   On: 07/07/2016 14:30   Ir Fluoro Guide Cv Line Left  Result Date: 07/07/2016 INDICATION: 71 year old with end-stage renal disease and retroperitoneal bleeding. Patient has poor venous access and needs additional venous access. Plan for central line placement. EXAM: FLUOROSCOPIC AND ULTRASOUND GUIDED PLACEMENT OF A NON TUNNELED CENTRAL LINE Physician: Stephan Minister. Henn, MD MEDICATIONS: None ANESTHESIA/SEDATION: None FLUOROSCOPY TIME:  Fluoroscopy Time: 1 minutes and 30 seconds. 3.9 mGy COMPLICATIONS: None immediate. PROCEDURE: The procedure was explained to the patient. The risks and benefits of the procedure were discussed and the patient's questions were addressed. Informed consent was obtained from the patient. Left internal jugular vein was noted to be patent by ultrasound. Left side of the neck was prepped and draped in a sterile fashion. Maximal barrier sterile technique was utilized including caps, mask, sterile gowns, sterile gloves, sterile drape, hand hygiene and skin antiseptic. Skin was anesthetized with 1% lidocaine and a small incision was made. Using ultrasound guidance, 21 gauge needle directed into the left internal jugular vein. Wire was advanced centrally. Peel-away sheath was placed. A dual lumen Power PICC line was cut to 19 cm. Catheter was advanced through the peel-away sheath and positioned at the superior cavoatrial junction. Catheter was sutured to the skin. Fluoroscopic and ultrasound images were  taken and saved for documentation. FINDINGS: Catheter tip at the superior cavoatrial junction. IMPRESSION: Successful placement of a non tunneled central line with ultrasound and fluoroscopic guidance. Electronically Signed   By: Markus Daft M.D.   On: 07/07/2016 10:40   Ir US Guide Vasc Access Left  Result Date: 07/07/2016 INDICATION: 71 year old with end-stage renal disease and retroperitoneal bleeding. Patient has poor venous access and needs additional venous access. Plan for central line placement. EXAM: FLUOROSCOPIC AND ULTRASOUND GUIDED PLACEMENT OF A NON TUNNELED CENTRAL LINE Physician: Stephan Minister. Henn, MD MEDICATIONS: None ANESTHESIA/SEDATION: None FLUOROSCOPY TIME:  Fluoroscopy Time: 1 minutes and 30 seconds. 3.9 mGy COMPLICATIONS: None immediate. PROCEDURE: The procedure was explained to the patient. The  risks and benefits of the procedure were discussed and the patient's questions were addressed. Informed consent was obtained from the patient. Left internal jugular vein was noted to be patent by ultrasound. Left side of the neck was prepped and draped in a sterile fashion. Maximal barrier sterile technique was utilized including caps, mask, sterile gowns, sterile gloves, sterile drape, hand hygiene and skin antiseptic. Skin was anesthetized with 1% lidocaine and a small incision was made. Using ultrasound guidance, 21 gauge needle directed into the left internal jugular vein. Wire was advanced centrally. Peel-away sheath was placed. A dual lumen Power PICC line was cut to 19 cm. Catheter was advanced through the peel-away sheath and positioned at the superior cavoatrial junction. Catheter was sutured to the skin. Fluoroscopic and ultrasound images were taken and saved for documentation. FINDINGS: Catheter tip at the superior cavoatrial junction. IMPRESSION: Successful placement of a non tunneled central line with ultrasound and fluoroscopic guidance. Electronically Signed   By: Markus Daft M.D.   On:  07/07/2016 10:40   Ct Renal Stone Study  Result Date: 07/06/2016 CLINICAL DATA:  Right flank pain starting tonight. Vomiting. Dialysis patient. EXAM: CT ABDOMEN AND PELVIS WITHOUT CONTRAST TECHNIQUE: Multidetector CT imaging of the abdomen and pelvis was performed following the standard protocol without IV contrast. COMPARISON:  06/27/2016 FINDINGS: Lower chest: Calcified granuloma in the right hilum. Atelectasis in the lung bases. Coronary artery calcifications. Hepatobiliary: Multiple low-attenuation nodular parenchymal lesions demonstrated throughout the liver. This is nonspecific in could represent metastasis, infectious process, or regenerative nodules. Similar appearance to previous study. Gallbladder is surgically absent. Mild bile duct dilatation is normal for postoperative physiology. Pancreas: Unremarkable. No pancreatic ductal dilatation or surrounding inflammatory changes. Spleen: Normal in size without focal abnormality. Adrenals/Urinary Tract: No adrenal gland nodules. Multiple bilateral renal lesions most consistent with cysts and hemorrhagic cysts. Bilateral renal atrophy. No hydronephrosis or hydroureter. Since the previous study, the right kidney has enlarged and has rotated to a horizontal position. There is a large hematoma that appears to be arising from the lower pole of the right kidney, extending into the anterior pararenal space and retroperitoneum. Hemorrhage extends along the right pericolic gutter. This would likely arise from rupture of a renal lesion, possibly a cyst or solid mass. Maximal measurement of the hematoma on the axial plane is 5.2 x 13 cm. Bladder wall is not thickened. Stomach/Bowel: Stomach is within normal limits. Appendix appears normal. No evidence of bowel wall thickening, distention, or inflammatory changes. Vascular/Lymphatic: Aortic calcification. A ectatic aorta without discrete aneurysm. Extensive calcification of iliac, external iliac, and common femoral  arteries as well as internal iliac arteries. No significant lymphadenopathy. Reproductive: Calcifications in the uterus consistent with fibroids. No abnormal adnexal masses. Other: No free air in the abdomen. Abdominal wall musculature appears intact. Musculoskeletal: Degenerative changes in the spine. Postoperative change in the left proximal femur. No destructive bone lesions. IMPRESSION: Large right pararenal and retroperitoneal hematoma likely arising from rupture of a a right renal lesion, possibly hemorrhagic cyst or solid mass. A ruptured renal cell carcinoma should be excluded. Both kidneys demonstrate multiple hemorrhagic and nonhemorrhagic cysts. No hydronephrosis. Multiple tiny low-attenuation nodules throughout the liver similar to prior study. This could represent metastasis, infectious process, regenerative nodules. These results were called by telephone at the time of interpretation on 07/06/2016 at 5:53 am to Dr. Rolland Porter , who verbally acknowledged these results. Electronically Signed   By: Lucienne Capers M.D.   On: 07/06/2016 06:01  Scheduled Meds: . benzonatate  200 mg Oral TID  . calcitRIOL  0.25 mcg Oral Q T,Th,Sa-HD  . calcium acetate  1,334 mg Oral TID WC  . [START ON 07/09/2016] ferric gluconate (FERRLECIT/NULECIT) IV  250 mg Intravenous Q T,Th,Sa-HD  . insulin aspart  0-5 Units Subcutaneous QHS  . insulin aspart  0-9 Units Subcutaneous TID WC  . lidocaine (PF)      . multivitamin  1 tablet Oral QHS   Continuous Infusions:   LOS: 1 day    Time spent: 40 minutes    Tamlyn Sides, MD [facility] Pager [phone]  If 7PM-7AM, please contact night-coverage [email or web address] Password [facility] 07/07/2016, 4:48 PM

## 2016-07-07 NOTE — Progress Notes (Signed)
Verified with floor nurse that pt was in HD and to contact HD to draw CBC. VU. Fran Lowes, RN VAST

## 2016-07-07 NOTE — Consult Note (Signed)
           [facility] CM Primary Care Navigator  07/07/2016  [name] 1945-09-18 [id number]   Went to see patient in the room to identify possible discharge needsbut RN states that patient is in dialysis at the moment.  Will try to meet with patient at another time when available.  For questions, please contact:  Dannielle Huh, BSN, RN- [facility] Primary Care Navigator  Telephone: [phone] [place]

## 2016-07-07 NOTE — Progress Notes (Signed)
Dialysis treatment completed.  2500 mL ultrafiltrated and net fluid removal 2000 mL.    Patient status unchanged. Lung sounds diminished to ausculation in all fields. Generalized edema. Cardiac: NSR.  Disconnected lines and removed needles.  Pressure held for 10 minutes and band aid/gauze dressing applied.  Report given to bedside RN, Jimmie Molly.

## 2016-07-07 NOTE — Progress Notes (Signed)
Subjective:  1 - Retroperitoneal Hematoma - New retroperitoneal bleed from suspect Rt kidney / mass / cyst by ER CT 06/2016 on eval right flank pain. Has known bilateral renal masses stable on surveillance x years given her substantial comorbidity and status as Jehovah's Witness.   Given very poor surgical candidate this has been managed with serial labs, consider embolization if severe drop:  Baseline Hgb approx 10.8 07/06/16 - Hgb 9.1 07/07/16 - Hgb 7.9 --> 7.5   2 - Bilateral renal masses- multifocal enhancing Rt>Lt renal masses orrig noted by imaging 09/2014 and stable on surveillance including 05/2016. No masses >2.5cm.    3 - End Stage Renal Disease - on dialysis via AVF Rt artm TTS likely due to renovascular and intrinsic medial renal disease.    Today "[name]" is feeling subjectively improved. Less abdominal discomfort. NO orthostasis. Some low grade fever as expected with hematoma.    Objective: Vital signs in last 24 hours: Temp:  [98.2 F (36.8 C)-99.4 F (37.4 C)] 98.7 F (37.1 C) (02/08 0535) Pulse Rate:  [70-92] 80 (02/08 0535) Resp:  [16-21] 18 (02/08 0535) BP: (88-161)/(30-80) 112/49 (02/08 0535) SpO2:  [98 %-100 %] 100 % (02/08 0535) Weight:  [77.2 kg (170 lb 3.1 oz)-80.2 kg (176 lb 12.9 oz)] 80.2 kg (176 lb 12.9 oz) (02/07 2107) Last BM Date:  (PTA)  Intake/Output from previous day: 02/07 0701 - 02/08 0700 In: 0  Out: 825  Intake/Output this shift: No intake/output data recorded.  General appearance: alert, cooperative, appears stated age and in hemodialysis, very close to baseline.  Eyes: negative Nose: Nares normal. Septum midline. Mucosa normal. No drainage or sinus tenderness. Throat: lips, mucosa, and tongue normal; teeth and gums normal Back: symmetric, no curvature. ROM normal. No CVA tenderness. Cardio: Nl rate at present by bedside monitor.  GI: soft, non-tender; bowel sounds normal; no masses,  no organomegaly and stable moderate truncal obesity.  No TTP whatsoever.  Extremities: extremities normal, atraumatic, no cyanosis or edema Pulses: 2+ and symmetric Lymph nodes: Cervical, supraclavicular, and axillary nodes normal. Neurologic: Grossly normal  No CVAT or flank ecchymoses.  Lab Results:   Recent Labs  07/06/16 1602 07/07/16 0236  WBC 13.2* 9.7  HGB 9.1* 7.9*  HCT 30.7* 28.0*  PLT 212 182   BMET  Recent Labs  07/06/16 1602 07/07/16 0236  NA 136 136  K 3.3* 4.0  CL 96* 93*  CO2 27 28  GLUCOSE 98 113*  BUN 25* 25*  CREATININE 4.90* 6.17*  CALCIUM 8.6* 8.0*   PT/INR No results for input(s): LABPROT, INR in the last 72 hours. ABG No results for input(s): PHART, HCO3 in the last 72 hours.  Invalid input(s): PCO2, PO2  Studies/Results: Ct Renal Stone Study  Result Date: 07/06/2016 CLINICAL DATA:  Right flank pain starting tonight. Vomiting. Dialysis patient. EXAM: CT ABDOMEN AND PELVIS WITHOUT CONTRAST TECHNIQUE: Multidetector CT imaging of the abdomen and pelvis was performed following the standard protocol without IV contrast. COMPARISON:  06/27/2016 FINDINGS: Lower chest: Calcified granuloma in the right hilum. Atelectasis in the lung bases. Coronary artery calcifications. Hepatobiliary: Multiple low-attenuation nodular parenchymal lesions demonstrated throughout the liver. This is nonspecific in could represent metastasis, infectious process, or regenerative nodules. Similar appearance to previous study. Gallbladder is surgically absent. Mild bile duct dilatation is normal for postoperative physiology. Pancreas: Unremarkable. No pancreatic ductal dilatation or surrounding inflammatory changes. Spleen: Normal in size without focal abnormality. Adrenals/Urinary Tract: No adrenal gland nodules. Multiple bilateral renal lesions most consistent  with cysts and hemorrhagic cysts. Bilateral renal atrophy. No hydronephrosis or hydroureter. Since the previous study, the right kidney has enlarged and has rotated to a  horizontal position. There is a large hematoma that appears to be arising from the lower pole of the right kidney, extending into the anterior pararenal space and retroperitoneum. Hemorrhage extends along the right pericolic gutter. This would likely arise from rupture of a renal lesion, possibly a cyst or solid mass. Maximal measurement of the hematoma on the axial plane is 5.2 x 13 cm. Bladder wall is not thickened. Stomach/Bowel: Stomach is within normal limits. Appendix appears normal. No evidence of bowel wall thickening, distention, or inflammatory changes. Vascular/Lymphatic: Aortic calcification. A ectatic aorta without discrete aneurysm. Extensive calcification of iliac, external iliac, and common femoral arteries as well as internal iliac arteries. No significant lymphadenopathy. Reproductive: Calcifications in the uterus consistent with fibroids. No abnormal adnexal masses. Other: No free air in the abdomen. Abdominal wall musculature appears intact. Musculoskeletal: Degenerative changes in the spine. Postoperative change in the left proximal femur. No destructive bone lesions. IMPRESSION: Large right pararenal and retroperitoneal hematoma likely arising from rupture of a a right renal lesion, possibly hemorrhagic cyst or solid mass. A ruptured renal cell carcinoma should be excluded. Both kidneys demonstrate multiple hemorrhagic and nonhemorrhagic cysts. No hydronephrosis. Multiple tiny low-attenuation nodules throughout the liver similar to prior study. This could represent metastasis, infectious process, regenerative nodules. These results were called by telephone at the time of interpretation on 07/06/2016 at 5:53 am to Dr. Rolland Porter , who verbally acknowledged these results. Electronically Signed   By: Lucienne Capers M.D.   On: 07/06/2016 06:01    Anti-infectives: Anti-infectives    None      Assessment/Plan:  1 - Retroperitoneal Hematoma - again favor conservative approach with bedrest,  serial Hgb, consider angiography / embolization (selective as possible) for Hgb <7 or acute chance in status. I have substantial reservations about her ability to withstand any sort of heroic surgery (unilateral v. Bilateral nephrectomy) given underlying comorbidity and inability to receive blood products. Discussed with pt at length natural history and management options and she is in full agreement with current plan.    2 - Bilateral renal masses- not candidate for curative therapy at this time, continue surveillance.    3 - End Stage Renal Disease - appreciate in house management from nephrology.   Please call me directly with questions anytime.   [facility], Natoya Viscomi 07/07/2016

## 2016-07-07 NOTE — Progress Notes (Signed)
Patient arrived to unit per bed.  Reviewed treatment plan and this RN agrees.  Report received from bedside RN, Angela Nevin.  Consent verified.  Patient A & O X 4. Lung sounds diminished to ausculation in all fields. Genearlized edema. Cardiac: NSR.  Prepped RUAVF with alcohol and cannulated with two 15 gauge needles.  Pulsation of blood noted.  Flushed access well with saline per protocol.  Connected and secured lines and initiated tx at 1618.  UF goal of 2500 mL and net fluid removal of 2000 mL.  Will continue to monitor.

## 2016-07-07 NOTE — Procedures (Signed)
Placement of left jugular central line.  Tip at SVC/RA junction.  No immediate complication.  Ready to use.

## 2016-07-07 NOTE — Progress Notes (Signed)
[facility] Progress Note   Subjective: abd pain is much better.  Hb down to 7.9  Vitals:   07/06/16 1815 07/06/16 1854 07/06/16 2107 07/07/16 0535  BP: (!) 111/48 (!) 107/45 (!) 109/48 (!) 112/49  Pulse: 92 80 76 80  Resp: '18 16 17 18  '$ Temp: 98.4 F (36.9 C) 99.4 F (37.4 C) 99.2 F (37.3 C) 98.7 F (37.1 C)  TempSrc: Oral Oral Oral Oral  SpO2: 98% 98% 100% 100%  Weight: 77.2 kg (170 lb 3.1 oz)  80.2 kg (176 lb 12.9 oz)   Height:        Inpatient medications: . calcitRIOL  0.25 mcg Oral Q T,Th,Sa-HD  . calcium acetate  1,334 mg Oral TID WC  . [START ON 07/12/2016] ferric gluconate (FERRLECIT/NULECIT) IV  125 mg Intravenous Q Tue-HD  . insulin aspart  0-5 Units Subcutaneous QHS  . insulin aspart  0-9 Units Subcutaneous TID WC  . lidocaine (PF)      . multivitamin  1 tablet Oral QHS    lidocaine, ondansetron (ZOFRAN) IV, ondansetron  Exam: General: pleasant  elderly AAF uncomfortable Head: NCAT sclera not icteric MMM Neck: Supple.  Lungs:  Few crackles Breathing is unlabored. Heart: RRR with S1 S2.  Abdomen: soft  + BS; tender along right flank Lower extremities: left BKA no edema, right 1+ edema no open wounds  Neuro: A & O  X 3. Moves all extremities spontaneously. Psych:  Responds to questions appropriately with a normal affect. Dialysis Access: right upper AVF  Dialysis Orders: [place] [facility]  3h 44mn   2/2.25 bath   76kg   Hep none   RUA AVF calcitriol 0.25 Mircera 150 given 2/6 Recent labs: hgb 10.3 12% sat Jan, 15% Dec- s/o course of IV Fe for January ferritin 1365 iPTH 307 Ca/P ok  Assessment: 1. Right retroperitoneal bleed -seen by urology, poor surg candidaet, conservative management for now. Hb down 7.9. Pain better.  2. ESRD - TTS HD 3. Hypertension/volume  - on norvasc 5 4. Anemia  - hgb 9.5 - this am expect to drop further due to RPB- recheck Fe studies with HD she is s/p a course of Fe due to low tsat 12% - 150 Mircera given 2/6-  Pt is JW- start weekly Fe. She refuses blood transfusion even in life-threatening situations, have d/w her today. 5. Metabolic bone disease -  Continue calcitriol and 2 phoslo resume tomorrow- controlled 6. Nutrition - npo for now advance as tol to renal carb mod/renavites resume tomorrow 7. DM - per primary   Plan - HD again today to get back on schedule.    RKelly SplinterMD [facility] pager [phone]  07/07/2016, 10:07 AM    Recent Labs Lab 07/06/16 0742 07/06/16 1602 07/07/16 0236  NA 135 136 136  K 4.1 3.3* 4.0  CL 93* 96* 93*  CO2 '25 27 28  '$ GLUCOSE 184* 98 113*  BUN 53* 25* 25*  CREATININE 8.86* 4.90* 6.17*  CALCIUM 8.1* 8.6* 8.0*    Recent Labs Lab 07/07/16 0236  AST 20  ALT 20  ALKPHOS 60  BILITOT 0.6  PROT 6.4*  ALBUMIN 2.9*    Recent Labs Lab 07/06/16 0742 07/06/16 1602 07/07/16 0236  WBC 12.3* 13.2* 9.7  NEUTROABS 10.1*  --   --   HGB 9.5* 9.1* 7.9*  HCT 30.9* 30.7* 28.0*  MCV 77.4* 76.4* 77.8*  PLT 188 212 182   Iron/TIBC/Ferritin/ %Sat    Component Value  Date/Time   IRON 14 (L) 07/06/2016 1516   TIBC 213 (L) 07/06/2016 1516   FERRITIN 596 (H) 11/17/2009 1014   IRONPCTSAT 7 (L) 07/06/2016 1516

## 2016-07-08 LAB — CBC WITH DIFFERENTIAL/PLATELET
Basophils Absolute: 0 10*3/uL (ref 0.0–0.1)
Basophils Relative: 0 %
Eosinophils Absolute: 0 10*3/uL (ref 0.0–0.7)
Eosinophils Relative: 0 %
HEMATOCRIT: 26.2 % — AB (ref 36.0–46.0)
Hemoglobin: 7.7 g/dL — ABNORMAL LOW (ref 12.0–15.0)
LYMPHS ABS: 1.5 10*3/uL (ref 0.7–4.0)
LYMPHS PCT: 13 %
MCH: 23 pg — AB (ref 26.0–34.0)
MCHC: 29.4 g/dL — AB (ref 30.0–36.0)
MCV: 78.2 fL (ref 78.0–100.0)
MONO ABS: 1 10*3/uL (ref 0.1–1.0)
MONOS PCT: 9 %
NEUTROS ABS: 9.1 10*3/uL — AB (ref 1.7–7.7)
Neutrophils Relative %: 78 %
Platelets: 184 10*3/uL (ref 150–400)
RBC: 3.35 MIL/uL — ABNORMAL LOW (ref 3.87–5.11)
RDW: 20.9 % — AB (ref 11.5–15.5)
WBC: 11.6 10*3/uL — ABNORMAL HIGH (ref 4.0–10.5)

## 2016-07-08 LAB — GLUCOSE, CAPILLARY
GLUCOSE-CAPILLARY: 173 mg/dL — AB (ref 65–99)
GLUCOSE-CAPILLARY: 222 mg/dL — AB (ref 65–99)
GLUCOSE-CAPILLARY: 261 mg/dL — AB (ref 65–99)
Glucose-Capillary: 189 mg/dL — ABNORMAL HIGH (ref 65–99)

## 2016-07-08 LAB — RENAL FUNCTION PANEL
ALBUMIN: 2.9 g/dL — AB (ref 3.5–5.0)
Anion gap: 15 (ref 5–15)
BUN: 14 mg/dL (ref 6–20)
CALCIUM: 7.9 mg/dL — AB (ref 8.9–10.3)
CHLORIDE: 91 mmol/L — AB (ref 101–111)
CO2: 26 mmol/L (ref 22–32)
CREATININE: 4.51 mg/dL — AB (ref 0.44–1.00)
GFR, EST AFRICAN AMERICAN: 10 mL/min — AB (ref >60)
GFR, EST NON AFRICAN AMERICAN: 9 mL/min — AB (ref >60)
Glucose, Bld: 222 mg/dL — ABNORMAL HIGH (ref 65–99)
PHOSPHORUS: 3 mg/dL (ref 2.5–4.6)
Potassium: 4.1 mmol/L (ref 3.5–5.1)
SODIUM: 132 mmol/L — AB (ref 135–145)

## 2016-07-08 LAB — HIV ANTIBODY (ROUTINE TESTING W REFLEX): HIV Screen 4th Generation wRfx: NONREACTIVE

## 2016-07-08 MED ORDER — ALBUTEROL SULFATE (2.5 MG/3ML) 0.083% IN NEBU
2.5000 mg | INHALATION_SOLUTION | Freq: Three times a day (TID) | RESPIRATORY_TRACT | Status: DC
  Filled 2016-07-08: qty 3

## 2016-07-08 NOTE — Consult Note (Signed)
[facility] CM Primary Care Navigator  07/08/2016  [name] [birth year] [id number]  Met with patient at the bedside to identify possible discharge needs. Patient had constant flank pain and she reports productive coughing that led to this admission.  Patient endorses Dr. [name] with [name] MD office as the primary care provider.    Patient shared using [facility] in [place] and [facility] to obtain medications without difficulty.   She reports managing her own medications at home straight out of the containers.   Patient mentioned of R Cat's transportation bringing her to dialysis. She states that grandson ([name]) provides transportation to her doctors' appointments.  Grandson lives with patient and assists her with care needs. Patient's son [name]) and sister [name]) will be able to provide assistance for her at home as well, per patient.   Anticipated discharge plan is home with family according to patient.  Patient voiced understanding to call primary care provider's office when she returns home, for a post discharge follow-up appointment within a week or sooner if needs arise. Patient letter (with PCP's contact number) was provided as a reminder.  Patient reports being a diabetic on insulin that is self administered. She states monitoring her blood sugar daily but not recording results. Patient admits decreased awareness with DM management. [name] unclear with signs and symptoms of low or increased blood sugar, insulin administration based on sliding scale and when to call the doctor for help. Patient was unsure with DM diet compliance as well. Most recent A1c is 6.2. Patient verbally agreed to [facility] care management referral for disease management of DM.  Referral to [place] care management made to follow-up with patient after hospital discharge. For additional questions please contact:  [name],  BSN, RN-BC [facility] PRIMARY CARE Navigator Cell: [phone]

## 2016-07-08 NOTE — Progress Notes (Signed)
[facility] Progress Note   Subjective: No c/os Denies abd pain Hgb 7.7  Vitals:   07/08/16 0310 07/08/16 0548 07/08/16 0804 07/08/16 1020  BP:  (!) 127/30 (!) 138/41 (!) 103/38  Pulse:  87 89 88  Resp:  '18 16 14  '$ Temp:  (!) 100.4 F (38 C) 100 F (37.8 C) 98.8 F (37.1 C)  TempSrc:   Oral Oral  SpO2: 95% 99% 100%   Weight:  77 kg (169 lb 12.1 oz)    Height:        Inpatient medications: . albuterol  2.5 mg Nebulization Q6H  . azithromycin  500 mg Intravenous Q24H  . calcitRIOL  0.25 mcg Oral Q T,Th,Sa-HD  . calcium acetate  1,334 mg Oral TID WC  . cefTRIAXone (ROCEPHIN)  IV  1 g Intravenous Q24H  . [START ON 07/09/2016] ferric gluconate (FERRLECIT/NULECIT) IV  250 mg Intravenous Q T,Th,Sa-HD  . guaiFENesin  1,200 mg Oral BID  . insulin aspart  0-5 Units Subcutaneous QHS  . insulin aspart  0-9 Units Subcutaneous TID WC  . levothyroxine  75 mcg Oral QAC breakfast  . multivitamin  1 tablet Oral QHS    sodium chloride, sodium chloride, acetaminophen **OR** acetaminophen, albuterol, alteplase, calcium carbonate (dosed in mg elemental calcium), camphor-menthol **AND** hydrOXYzine, docusate sodium, feeding supplement (NEPRO CARB STEADY), heparin, HYDROcodone-homatropine, lidocaine (PF), lidocaine, lidocaine-prilocaine, metoCLOPramide, ondansetron **OR** ondansetron (ZOFRAN) IV, oxyCODONE-acetaminophen, pentafluoroprop-tetrafluoroeth, sodium chloride flush, sorbitol, zolpidem  Exam: General: pleasant  elderly [name] uncomfortable Lungs:  Few crackles Breathing is unlabored. Heart: RRR with S1 S2.  Abdomen: soft  + BS; tender along right flank Lower extremities: left BKA no edema, right 1+ edema no open wounds  Neuro: A & O  X 3. Moves all extremities spontaneously. Dialysis Access: right upper AVF +thrill   Dialysis Orders: [place] [facility]  3h 59mn   2/2.25 bath   76kg   Hep none   RUA AVF calcitriol 0.25 Mircera 150 given 2/6 Recent labs: hgb 10.3 12% sat Jan,  15% Dec- s/o course of IV Fe for January ferritin 1365 iPTH 307 Ca/P ok  Assessment: 1. Right retroperitoneal bleed -seen by urology, poor surg candidaet, conservative management for now. Hb trending down. Pain better.  2. Cough/- CXR with L suprahilar opacity - per primary on IV abx for pneumonia  3. ESRD - TTS HD 4. Hypertension/volume  - BP controlled on norvasc 5 5. Anemia  - hgb 7.7 Tsat 7% expected  drop due to RPB-  she is s/p a course of Fe due to low tsat 12% - 150 Mircera given 2/6- Pt is JW- start weekly Fe. She refuses blood transfusion even in life-threatening situations, has been d/w her 6. Metabolic bone disease -  Continue calcitriol and 2 phoslo resume controlled 7. Nutrition - npo for now advance as tol to renal carb mod/vitamins  8. DM - per primary   Plan - HD tomorrow -follow hgb , cont Fe load with HD    OLynnda ChildPA-C CKnoxvillePager [phone]/01/2017,1:59 PM  Pt seen, examined and agree w A/P as above.  Sheila SplinterMD [facility] pager [phone]  07/08/2016, 2:39 PM     Recent Labs Lab 07/07/16 0236 07/07/16 2000 07/08/16 0356  NA 136 136 132*  K 4.0 3.9 4.1  CL 93* 94* 91*  CO2 '28 27 26  '$ GLUCOSE 113* 163* 222*  BUN 25* 11 14  CREATININE 6.17* 3.66* 4.51*  CALCIUM 8.0*  8.1* 7.9*  PHOS  --  2.6 3.0    Recent Labs Lab 07/07/16 0236 07/07/16 2000 07/08/16 0356  AST 20  --   --   ALT 20  --   --   ALKPHOS 60  --   --   BILITOT 0.6  --   --   PROT 6.4*  --   --   ALBUMIN 2.9* 3.1* 2.9*    Recent Labs Lab 07/06/16 0742  07/07/16 1051 07/07/16 2000 07/08/16 0356  WBC 12.3*  < > 9.4 12.1* 11.6*  NEUTROABS 10.1*  --   --   --  9.1*  HGB 9.5*  < > 7.5* 7.8* 7.7*  HCT 30.9*  < > 25.8* 27.4* 26.2*  MCV 77.4*  < > 77.5* 77.8* 78.2  PLT 188  < > 185 197 184  < > = values in this interval not displayed. Iron/TIBC/Ferritin/ %Sat    Component Value Date/Time   IRON 14 (L) 07/06/2016 1516    TIBC 213 (L) 07/06/2016 1516   FERRITIN 596 (H) 11/17/2009 1014   IRONPCTSAT 7 (L) 07/06/2016 1516

## 2016-07-08 NOTE — Progress Notes (Signed)
PROGRESS NOTE    [name]  SLH:[id number] DOB: 1946-02-12 DOA: 07/06/2016 PCP: [name], MD    Brief Narrative:  [name] is a 71 y.o. female with medical history significant of ESRD on dialysis (on Tuesday, Thursday, Saturday) who only had a partial dialysis session yesterday, anemia of ESRD, HTN who presents with flank pain that woke patient from sleep. Pain 9/10 and patient felt the need to sit up.  Pain was constant and patient states she has has emesis associated with the pain.  No fever, no chills, no chest pain or pressure, no shortness of breath.  Pain is localized and does not radiate; patient reports pain is dull and constant.  No increase in pain with movement. CT was done in January for evaluation of her appendix.  She was found to have a kidney mass at that time and followed up with a urologist.  Says that the urologist told her that the mass was stable and no further intervention was necessary.    ED Course: patient underwent CT renal stone study which showed Large right pararenal and retroperitoneal hematoma likely arising from rupture of a a right renal lesion, possibly hemorrhagic cyst or solid mass. A ruptured renal cell carcinoma should be excluded. Both kidneys demonstrate multiple hemorrhagic and nonhemorrhagic cysts. No hydronephrosis. Multiple tiny low-attenuation nodules throughout the liver similar to prior study. This could represent metastasis, infectious process, regenerative nodules   Assessment & Plan:   Principal Problem:   Retroperitoneal bleed Active Problems:   Diabetes mellitus type II, uncontrolled (HCC)   Iron deficiency anemia   Hypertension   Peripheral vascular disease (HCC)   RENAL DISEASE, CHRONIC, STAGE V   ESRD (end stage renal disease) (HCC)   Retroperitoneal bleeding   Acute blood loss anemia   Community acquired pneumonia of left lung   Hypothyroidism  #1 retroperitoneal bleed Per urology likely from the right lower pole  mass/cyst. Per urology patient not a good surgical candidate and recommended conservative treatment at this time. Hemoglobin trending down and currently at 7.7 from 9.5 on admission. Patient is a Jehovah witness and does not receive blood transfusions. Patient recently given IV iron. Follow H&H. Per urology.  #2 end-stage renal disease On hemodialysis Tuesdays Thursdays Saturdays. Patient received a brief 30 minutes treatment prior to admission secondary to diarrhea. No further diarrhea. Patient received hemodialysis yesterday and hemodialysis today. Per nephrology.  #3 acute blood loss anemia/anemia of chronic disease Secondary to problem #1. Patient received IV iron on 07/06/2015. Patient to get weekly IV iron per nephrology. Follow H&H. Hemoglobin currently at 7.7 from 9.5 on admission. Patient is a Sales promotion account executive Witness and does not receive blood transfusions.  #4 hypertension Blood pressure borderline this morning after hemodialysis. Discontinue Norvasc. Resume Norvasc as needed.   #5 cough/probable pneumonia Patient noted to have a productive cough per nursing. Chest x-ray obtained with left suprahilar pneumonia versus mass. Sputum Gram stain and culture pending. Blood cultures pending. Urine Legionella and urine pneumococcus antigen pending. Continue empiric IV Rocephin and azithromycin, Mucinex, nebulizer treatments.   #6 diabetes mellitus type 2 Hemoglobin A1c 6.21 07/06/2016. CBGs ranging from 126-222. Sliding scale insulin.  #7 hypothyroidism TSH is 0.655. Resumed home dose Synthroid.   DVT prophylaxis: SCDs Code Status: Full Family Communication: Updated patient. No family at bedside. Disposition Plan: Home once hematoma has improved, hemoglobin stabilized, per urology and nephrology.   Consultants:   Nephrology: Dr.Schertz 07/06/2016  Urology: Dr. Matilde Sprang 07/06/2016  Interventional radiology: Dr. Kathlene Cote  Procedures:   CT stone protocol 07/06/2016.  Chest x-ray  07/07/2016  Fluoroscopy guided left jugular central line per Dr.Henn 07/07/2016   Antimicrobials:   None   Subjective: Patient denies CP. No shortness of breath. Patient states back pain is improving.  Objective: Vitals:   07/08/16 0310 07/08/16 0548 07/08/16 0804 07/08/16 1020  BP:  (!) 127/30 (!) 138/41 (!) 103/38  Pulse:  87 89 88  Resp:  '18 16 14  '$ Temp:  (!) 100.4 F (38 C) 100 F (37.8 C) 98.8 F (37.1 C)  TempSrc:   Oral Oral  SpO2: 95% 99% 100%   Weight:  77 kg (169 lb 12.1 oz)    Height:        Intake/Output Summary (Last 24 hours) at 07/08/16 1225 Last data filed at 07/08/16 0600  Gross per 24 hour  Intake              420 ml  Output             2000 ml  Net            -1580 ml   Filed Weights   07/07/16 1609 07/07/16 1918 07/08/16 0548  Weight: 79 kg (174 lb 2.6 oz) 77 kg (169 lb 12.1 oz) 77 kg (169 lb 12.1 oz)    Examination:  General exam: Appears calm and comfortable  Respiratory system: Clear to auscultation. Respiratory effort normal. Cardiovascular system: S1 & S2 heard, RRR. No JVD, murmurs, rubs, gallops or clicks. No pedal edema. Gastrointestinal system: Abdomen is nondistended, soft and nontender. No organomegaly or masses felt. Normal bowel sounds heard. Central nervous system: Alert and oriented. No focal neurological deficits. Extremities: Symmetric 5 x 5 power. Skin: No rashes, lesions or ulcers Psychiatry: Judgement and insight appear normal. Mood & affect appropriate.     Data Reviewed: I have personally reviewed following labs and imaging studies  CBC:  Recent Labs Lab 07/06/16 0742 07/06/16 1602 07/07/16 0236 07/07/16 1051 07/07/16 2000 07/08/16 0356  WBC 12.3* 13.2* 9.7 9.4 12.1* 11.6*  NEUTROABS 10.1*  --   --   --   --  9.1*  HGB 9.5* 9.1* 7.9* 7.5* 7.8* 7.7*  HCT 30.9* 30.7* 28.0* 25.8* 27.4* 26.2*  MCV 77.4* 76.4* 77.8* 77.5* 77.8* 78.2  PLT 188 212 182 185 197 800   Basic Metabolic Panel:  Recent Labs Lab  07/06/16 0742 07/06/16 1602 07/07/16 0236 07/07/16 2000 07/08/16 0356  NA 135 136 136 136 132*  K 4.1 3.3* 4.0 3.9 4.1  CL 93* 96* 93* 94* 91*  CO2 '25 27 28 27 26  '$ GLUCOSE 184* 98 113* 163* 222*  BUN 53* 25* 25* 11 14  CREATININE 8.86* 4.90* 6.17* 3.66* 4.51*  CALCIUM 8.1* 8.6* 8.0* 8.1* 7.9*  PHOS  --   --   --  2.6 3.0   GFR: Estimated Creatinine Clearance: 11.4 mL/min (by C-G formula based on SCr of 4.51 mg/dL (H)). Liver Function Tests:  Recent Labs Lab 07/07/16 0236 07/07/16 2000 07/08/16 0356  AST 20  --   --   ALT 20  --   --   ALKPHOS 60  --   --   BILITOT 0.6  --   --   PROT 6.4*  --   --   ALBUMIN 2.9* 3.1* 2.9*   No results for input(s): LIPASE, AMYLASE in the last 168 hours. No results for input(s): AMMONIA in the last 168 hours. Coagulation Profile: No results for input(s): INR,  PROTIME in the last 168 hours. Cardiac Enzymes: No results for input(s): CKTOTAL, CKMB, CKMBINDEX, TROPONINI in the last 168 hours. BNP (last 3 results) No results for input(s): PROBNP in the last 8760 hours. HbA1C:  Recent Labs  07/06/16 1256  HGBA1C 6.2*   CBG:  Recent Labs Lab 07/07/16 0815 07/07/16 1225 07/07/16 1953 07/08/16 0812 07/08/16 1219  GLUCAP 114* 191* 126* 173* 222*   Lipid Profile: No results for input(s): CHOL, HDL, LDLCALC, TRIG, CHOLHDL, LDLDIRECT in the last 72 hours. Thyroid Function Tests:  Recent Labs  07/07/16 1730  TSH 0.655   Anemia Panel:  Recent Labs  07/06/16 1516  TIBC 213*  IRON 14*   Sepsis Labs: No results for input(s): PROCALCITON, LATICACIDVEN in the last 168 hours.  Recent Results (from the past 240 hour(s))  MRSA PCR Screening     Status: None   Collection Time: 07/07/16  8:21 AM  Result Value Ref Range Status   MRSA by PCR NEGATIVE NEGATIVE Final    Comment:        The GeneXpert MRSA Assay (FDA approved for NASAL specimens only), is one component of a comprehensive MRSA colonization surveillance program.  It is not intended to diagnose MRSA infection nor to guide or monitor treatment for MRSA infections.          Radiology Studies: Dg Chest 2 View  Result Date: 07/07/2016 CLINICAL DATA:  Productive cough. EXAM: CHEST  2 VIEW COMPARISON:  08/22/2015 FINDINGS: Left suprahilar opacity with rounded appearance. Indistinct right infrahilar opacity with mild volume loss. No edema, effusion, or pneumothorax. Normal heart size and mediastinal contours. Left IJ central line with tip at the SVC level. IMPRESSION: 1. Left suprahilar pneumonia versus mass. Recommend short follow-up chest x-ray or chest CT. 2. Mild atelectasis and pleural fluid on the right. Electronically Signed   By: Monte Fantasia M.D.   On: 07/07/2016 14:30   Ir Fluoro Guide Cv Line Left  Result Date: 07/07/2016 INDICATION: 71 year old with end-stage renal disease and retroperitoneal bleeding. Patient has poor venous access and needs additional venous access. Plan for central line placement. EXAM: FLUOROSCOPIC AND ULTRASOUND GUIDED PLACEMENT OF A NON TUNNELED CENTRAL LINE Physician: Stephan Minister. Henn, MD MEDICATIONS: None ANESTHESIA/SEDATION: None FLUOROSCOPY TIME:  Fluoroscopy Time: 1 minutes and 30 seconds. 3.9 mGy COMPLICATIONS: None immediate. PROCEDURE: The procedure was explained to the patient. The risks and benefits of the procedure were discussed and the patient's questions were addressed. Informed consent was obtained from the patient. Left internal jugular vein was noted to be patent by ultrasound. Left side of the neck was prepped and draped in a sterile fashion. Maximal barrier sterile technique was utilized including caps, mask, sterile gowns, sterile gloves, sterile drape, hand hygiene and skin antiseptic. Skin was anesthetized with 1% lidocaine and a small incision was made. Using ultrasound guidance, 21 gauge needle directed into the left internal jugular vein. Wire was advanced centrally. Peel-away sheath was placed. A dual lumen  Power PICC line was cut to 19 cm. Catheter was advanced through the peel-away sheath and positioned at the superior cavoatrial junction. Catheter was sutured to the skin. Fluoroscopic and ultrasound images were taken and saved for documentation. FINDINGS: Catheter tip at the superior cavoatrial junction. IMPRESSION: Successful placement of a non tunneled central line with ultrasound and fluoroscopic guidance. Electronically Signed   By: Markus Daft M.D.   On: 07/07/2016 10:40   Ir US Guide Vasc Access Left  Result Date: 07/07/2016 INDICATION: 71 year old with end-stage renal  disease and retroperitoneal bleeding. Patient has poor venous access and needs additional venous access. Plan for central line placement. EXAM: FLUOROSCOPIC AND ULTRASOUND GUIDED PLACEMENT OF A NON TUNNELED CENTRAL LINE Physician: Stephan Minister. Henn, MD MEDICATIONS: None ANESTHESIA/SEDATION: None FLUOROSCOPY TIME:  Fluoroscopy Time: 1 minutes and 30 seconds. 3.9 mGy COMPLICATIONS: None immediate. PROCEDURE: The procedure was explained to the patient. The risks and benefits of the procedure were discussed and the patient's questions were addressed. Informed consent was obtained from the patient. Left internal jugular vein was noted to be patent by ultrasound. Left side of the neck was prepped and draped in a sterile fashion. Maximal barrier sterile technique was utilized including caps, mask, sterile gowns, sterile gloves, sterile drape, hand hygiene and skin antiseptic. Skin was anesthetized with 1% lidocaine and a small incision was made. Using ultrasound guidance, 21 gauge needle directed into the left internal jugular vein. Wire was advanced centrally. Peel-away sheath was placed. A dual lumen Power PICC line was cut to 19 cm. Catheter was advanced through the peel-away sheath and positioned at the superior cavoatrial junction. Catheter was sutured to the skin. Fluoroscopic and ultrasound images were taken and saved for documentation. FINDINGS:  Catheter tip at the superior cavoatrial junction. IMPRESSION: Successful placement of a non tunneled central line with ultrasound and fluoroscopic guidance. Electronically Signed   By: Markus Daft M.D.   On: 07/07/2016 10:40        Scheduled Meds: . albuterol  2.5 mg Nebulization Q6H  . azithromycin  500 mg Intravenous Q24H  . calcitRIOL  0.25 mcg Oral Q T,Th,Sa-HD  . calcium acetate  1,334 mg Oral TID WC  . cefTRIAXone (ROCEPHIN)  IV  1 g Intravenous Q24H  . [START ON 07/09/2016] ferric gluconate (FERRLECIT/NULECIT) IV  250 mg Intravenous Q T,Th,Sa-HD  . guaiFENesin  1,200 mg Oral BID  . insulin aspart  0-5 Units Subcutaneous QHS  . insulin aspart  0-9 Units Subcutaneous TID WC  . levothyroxine  75 mcg Oral QAC breakfast  . multivitamin  1 tablet Oral QHS   Continuous Infusions:   LOS: 2 days    Time spent: 40 minutes    THOMPSON,DANIEL, MD [facility] Pager [phone]  If 7PM-7AM, please contact night-coverage [email or web address] Password [email or web address] 07/08/2016, 12:25 PM

## 2016-07-09 ENCOUNTER — Encounter (HOSPITAL_COMMUNITY): Payer: Self-pay | Admitting: *Deleted

## 2016-07-09 ENCOUNTER — Inpatient Hospital Stay (HOSPITAL_COMMUNITY): Payer: Medicare Other

## 2016-07-09 LAB — CBC
HCT: 24.7 % — ABNORMAL LOW (ref 36.0–46.0)
HCT: 24.7 % — ABNORMAL LOW (ref 36.0–46.0)
Hemoglobin: 7.1 g/dL — ABNORMAL LOW (ref 12.0–15.0)
Hemoglobin: 7.1 g/dL — ABNORMAL LOW (ref 12.0–15.0)
MCH: 22.3 pg — AB (ref 26.0–34.0)
MCH: 22.1 pg — ABNORMAL LOW (ref 26.0–34.0)
MCHC: 28.7 g/dL — ABNORMAL LOW (ref 30.0–36.0)
MCHC: 28.7 g/dL — ABNORMAL LOW (ref 30.0–36.0)
MCV: 77.4 fL — ABNORMAL LOW (ref 78.0–100.0)
MCV: 76.9 fL — ABNORMAL LOW (ref 78.0–100.0)
PLATELETS: 198 10*3/uL (ref 150–400)
PLATELETS: 204 10*3/uL (ref 150–400)
RBC: 3.19 MIL/uL — ABNORMAL LOW (ref 3.87–5.11)
RBC: 3.21 MIL/uL — AB (ref 3.87–5.11)
RDW: 20.5 % — AB (ref 11.5–15.5)
RDW: 20.4 % — AB (ref 11.5–15.5)
WBC: 14 10*3/uL — ABNORMAL HIGH (ref 4.0–10.5)
WBC: 14.3 10*3/uL — AB (ref 4.0–10.5)

## 2016-07-09 LAB — RENAL FUNCTION PANEL
Albumin: 2.6 g/dL — ABNORMAL LOW (ref 3.5–5.0)
Anion gap: 17 — ABNORMAL HIGH (ref 5–15)
BUN: 26 mg/dL — AB (ref 6–20)
CHLORIDE: 90 mmol/L — AB (ref 101–111)
CO2: 27 mmol/L (ref 22–32)
CREATININE: 6.77 mg/dL — AB (ref 0.44–1.00)
Calcium: 8.2 mg/dL — ABNORMAL LOW (ref 8.9–10.3)
GFR calc Af Amer: 6 mL/min — ABNORMAL LOW (ref >60)
GFR calc non Af Amer: 6 mL/min — ABNORMAL LOW (ref >60)
GLUCOSE: 179 mg/dL — AB (ref 65–99)
Phosphorus: 4 mg/dL (ref 2.5–4.6)
Potassium: 4.6 mmol/L (ref 3.5–5.1)
Sodium: 134 mmol/L — ABNORMAL LOW (ref 135–145)

## 2016-07-09 LAB — GLUCOSE, CAPILLARY
GLUCOSE-CAPILLARY: 101 mg/dL — AB (ref 65–99)
Glucose-Capillary: 162 mg/dL — ABNORMAL HIGH (ref 65–99)
Glucose-Capillary: 183 mg/dL — ABNORMAL HIGH (ref 65–99)

## 2016-07-09 MED ORDER — ORAL CARE MOUTH RINSE
15.0000 mL | Freq: Two times a day (BID) | OROMUCOSAL | Status: DC
  Administered 2016-07-10 (×2): 15 mL via OROMUCOSAL

## 2016-07-09 MED ORDER — CALCITRIOL 0.25 MCG PO CAPS
ORAL_CAPSULE | ORAL | Status: AC
  Filled 2016-07-09: qty 1

## 2016-07-09 MED ORDER — NEPRO/CARBSTEADY PO LIQD
237.0000 mL | Freq: Two times a day (BID) | ORAL | Status: DC
  Administered 2016-07-10 – 2016-07-12 (×2): 237 mL via ORAL

## 2016-07-09 MED ORDER — ACETAMINOPHEN 325 MG PO TABS
ORAL_TABLET | ORAL | Status: AC
  Filled 2016-07-09: qty 2

## 2016-07-09 NOTE — Progress Notes (Signed)
PROGRESS NOTE    [name]  VQQ:[id number] DOB: 1946/02/01 DOA: 07/06/2016 PCP: [name], MD    Brief Narrative:  [name] is a 71 y.o. female with medical history significant of ESRD on dialysis (on Tuesday, Thursday, Saturday) who only had a partial dialysis session yesterday, anemia of ESRD, HTN who presents with flank pain that woke patient from sleep. Pain 9/10 and patient felt the need to sit up.  Pain was constant and patient states she has has emesis associated with the pain.  No fever, no chills, no chest pain or pressure, no shortness of breath.  Pain is localized and does not radiate; patient reports pain is dull and constant.  No increase in pain with movement. CT was done in January for evaluation of her appendix.  She was found to have a kidney mass at that time and followed up with a urologist.  Says that the urologist told her that the mass was stable and no further intervention was necessary.    ED Course: patient underwent CT renal stone study which showed Large right pararenal and retroperitoneal hematoma likely arising from rupture of a a right renal lesion, possibly hemorrhagic cyst or solid mass. A ruptured renal cell carcinoma should be excluded. Both kidneys demonstrate multiple hemorrhagic and nonhemorrhagic cysts. No hydronephrosis. Multiple tiny low-attenuation nodules throughout the liver similar to prior study. This could represent metastasis, infectious process, regenerative nodules   Assessment & Plan:   Principal Problem:   Retroperitoneal bleed Active Problems:   Diabetes mellitus type II, uncontrolled (HCC)   Iron deficiency anemia   Hypertension   Peripheral vascular disease (HCC)   RENAL DISEASE, CHRONIC, STAGE V   ESRD (end stage renal disease) (HCC)   Retroperitoneal bleeding   Acute blood loss anemia   Community acquired pneumonia of left lung   Hypothyroidism  #1 retroperitoneal bleed Per urology likely from the right lower pole  mass/cyst. Per urology patient not a good surgical candidate and recommended conservative treatment at this time. Hemoglobin trending down and currently at 7.1 from 9.5 on admission. Patient is a Jehovah witness and does not receive blood transfusions. Patient recently given IV iron. Follow H&H. Per urology.  #2 end-stage renal disease On hemodialysis Tuesdays Thursdays Saturdays. Patient received a brief 30 minutes treatment prior to admission secondary to diarrhea. No further diarrhea. Patient receiving hemodialysis. Per nephrology.  #3 acute blood loss anemia/anemia of chronic disease Secondary to problem #1. Patient received IV iron on 07/06/2015. Patient to getting IV iron during HD per nephrology. Follow H&H. Hemoglobin currently at 7.1 from 9.5 on admission. Patient is a Sales promotion account executive Witness and does not receive blood transfusions.  #4 hypertension Blood pressure borderline and improved with discontinuation of Norvasc. Patient on hemodialysis.   #5 cough/probable pneumonia Patient noted to have a productive cough per nursing. Chest x-ray obtained with left suprahilar pneumonia versus mass. Sputum Gram stain and culture pending. Blood cultures pending. Urine Legionella and urine pneumococcus antigen pending. Continue empiric IV Rocephin and azithromycin, Mucinex, nebulizer treatments.   #6 diabetes mellitus type 2 Hemoglobin A1c 6.21 07/06/2016. CBGs ranging from 101-261. Sliding scale insulin.  #7 hypothyroidism TSH is 0.655. Resumed home dose Synthroid.   DVT prophylaxis: SCDs Code Status: Full Family Communication: Updated patient. No family at bedside. Disposition Plan: Home once hematoma has improved, hemoglobin stabilized, per urology and nephrology.   Consultants:   Nephrology: Dr.Schertz 07/06/2016  Urology: Dr. Matilde Sprang 07/06/2016  Interventional radiology: Dr. Kathlene Cote  Procedures:   CT  stone protocol 07/06/2016.  Chest x-ray 07/07/2016  Fluoroscopy guided left  jugular central line per Dr.Henn 07/07/2016   Antimicrobials:   IV azithromycin 07/07/2016  IV Rocephin 07/07/2016   Subjective: Patient denies CP. No shortness of breath. Patient states back pain is improving. Patient feeling better. Patient states cough is improved. Patient in hemodialysis.  Objective: Vitals:   07/09/16 0949 07/09/16 1015 07/09/16 1045 07/09/16 1110  BP: (!) 98/56 128/65 (!) 118/46 (!) 123/49  Pulse: 82 79 80 85  Resp: '17 17 17 17  '$ Temp:    98.3 F (36.8 C)  TempSrc:    Oral  SpO2:      Weight:      Height:        Intake/Output Summary (Last 24 hours) at 07/09/16 1211 Last data filed at 07/09/16 1110  Gross per 24 hour  Intake              680 ml  Output             1000 ml  Net             -320 ml   Filed Weights   07/08/16 0548 07/08/16 2053 07/09/16 0710  Weight: 77 kg (169 lb 12.1 oz) 77.1 kg (169 lb 15.6 oz) 77 kg (169 lb 12.1 oz)    Examination:  General exam: In HD Respiratory system: Clear to auscultation anterior lung fields. Respiratory effort normal. Cardiovascular system: S1 & S2 heard, RRR. No JVD, murmurs, rubs, gallops or clicks. No pedal edema. Gastrointestinal system: Abdomen is nondistended, soft and nontender. No organomegaly or masses felt. Normal bowel sounds heard. Central nervous system: Alert and oriented. No focal neurological deficits. Extremities: Symmetric 5 x 5 power. Skin: No rashes, lesions or ulcers Psychiatry: Judgement and insight appear normal. Mood & affect appropriate.     Data Reviewed: I have personally reviewed following labs and imaging studies  CBC:  Recent Labs Lab 07/06/16 0742  07/07/16 1051 07/07/16 2000 07/08/16 0356 07/09/16 0351 07/09/16 0924  WBC 12.3*  < > 9.4 12.1* 11.6* 14.0* 14.3*  NEUTROABS 10.1*  --   --   --  9.1*  --   --   HGB 9.5*  < > 7.5* 7.8* 7.7* 7.1* 7.1*  HCT 30.9*  < > 25.8* 27.4* 26.2* 24.7* 24.7*  MCV 77.4*  < > 77.5* 77.8* 78.2 77.4* 76.9*  PLT 188  < > 185 197  184 198 204  < > = values in this interval not displayed. Basic Metabolic Panel:  Recent Labs Lab 07/06/16 1602 07/07/16 0236 07/07/16 2000 07/08/16 0356 07/09/16 0351  NA 136 136 136 132* 134*  K 3.3* 4.0 3.9 4.1 4.6  CL 96* 93* 94* 91* 90*  CO2 '27 28 27 26 27  '$ GLUCOSE 98 113* 163* 222* 179*  BUN 25* 25* 11 14 26*  CREATININE 4.90* 6.17* 3.66* 4.51* 6.77*  CALCIUM 8.6* 8.0* 8.1* 7.9* 8.2*  PHOS  --   --  2.6 3.0 4.0   GFR: Estimated Creatinine Clearance: 7.6 mL/min (by C-G formula based on SCr of 6.77 mg/dL (H)). Liver Function Tests:  Recent Labs Lab 07/07/16 0236 07/07/16 2000 07/08/16 0356 07/09/16 0351  AST 20  --   --   --   ALT 20  --   --   --   ALKPHOS 60  --   --   --   BILITOT 0.6  --   --   --   PROT 6.4*  --   --   --  ALBUMIN 2.9* 3.1* 2.9* 2.6*   No results for input(s): LIPASE, AMYLASE in the last 168 hours. No results for input(s): AMMONIA in the last 168 hours. Coagulation Profile: No results for input(s): INR, PROTIME in the last 168 hours. Cardiac Enzymes: No results for input(s): CKTOTAL, CKMB, CKMBINDEX, TROPONINI in the last 168 hours. BNP (last 3 results) No results for input(s): PROBNP in the last 8760 hours. HbA1C:  Recent Labs  07/06/16 1256  HGBA1C 6.2*   CBG:  Recent Labs Lab 07/07/16 1953 07/08/16 0812 07/08/16 1219 07/08/16 1623 07/08/16 2046  GLUCAP 126* 173* 222* 189* 261*   Lipid Profile: No results for input(s): CHOL, HDL, LDLCALC, TRIG, CHOLHDL, LDLDIRECT in the last 72 hours. Thyroid Function Tests:  Recent Labs  07/07/16 1730  TSH 0.655   Anemia Panel:  Recent Labs  07/06/16 1516  TIBC 213*  IRON 14*   Sepsis Labs: No results for input(s): PROCALCITON, LATICACIDVEN in the last 168 hours.  Recent Results (from the past 240 hour(s))  MRSA PCR Screening     Status: None   Collection Time: 07/07/16  8:21 AM  Result Value Ref Range Status   MRSA by PCR NEGATIVE NEGATIVE Final    Comment:          The GeneXpert MRSA Assay (FDA approved for NASAL specimens only), is one component of a comprehensive MRSA colonization surveillance program. It is not intended to diagnose MRSA infection nor to guide or monitor treatment for MRSA infections.   Culture, blood (Routine X 2) w Reflex to ID Panel     Status: None (Preliminary result)   Collection Time: 07/08/16 12:52 PM  Result Value Ref Range Status   Specimen Description BLOOD LEFT HAND  Final   Special Requests IN PEDIATRIC BOTTLE 4CC  Final   Culture NO GROWTH < 24 HOURS  Final   Report Status PENDING  Incomplete  Culture, blood (Routine X 2) w Reflex to ID Panel     Status: None (Preliminary result)   Collection Time: 07/08/16 12:52 PM  Result Value Ref Range Status   Specimen Description BLOOD LEFT HAND  Final   Special Requests BOTTLES DRAWN AEROBIC AND ANAEROBIC 5CC  Final   Culture NO GROWTH < 24 HOURS  Final   Report Status PENDING  Incomplete         Radiology Studies: Dg Chest 2 View  Result Date: 07/07/2016 CLINICAL DATA:  Productive cough. EXAM: CHEST  2 VIEW COMPARISON:  08/22/2015 FINDINGS: Left suprahilar opacity with rounded appearance. Indistinct right infrahilar opacity with mild volume loss. No edema, effusion, or pneumothorax. Normal heart size and mediastinal contours. Left IJ central line with tip at the SVC level. IMPRESSION: 1. Left suprahilar pneumonia versus mass. Recommend short follow-up chest x-ray or chest CT. 2. Mild atelectasis and pleural fluid on the right. Electronically Signed   By: Monte Fantasia M.D.   On: 07/07/2016 14:30        Scheduled Meds: . azithromycin  500 mg Intravenous Q24H  . calcitRIOL  0.25 mcg Oral Q T,Th,Sa-HD  . calcium acetate  1,334 mg Oral TID WC  . cefTRIAXone (ROCEPHIN)  IV  1 g Intravenous Q24H  . feeding supplement (NEPRO CARB STEADY)  237 mL Oral BID BM  . ferric gluconate (FERRLECIT/NULECIT) IV  250 mg Intravenous Q T,Th,Sa-HD  . guaiFENesin  1,200 mg Oral  BID  . insulin aspart  0-5 Units Subcutaneous QHS  . insulin aspart  0-9 Units Subcutaneous TID WC  .  levothyroxine  75 mcg Oral QAC breakfast  . mouth rinse  15 mL Mouth Rinse BID  . multivitamin  1 tablet Oral QHS   Continuous Infusions:   LOS: 3 days    Time spent: 33 minutes    Abubakar Crispo, MD [facility] Pager [phone]  If 7PM-7AM, please contact night-coverage [email or web address] Password [email or web address] 07/09/2016, 12:11 PM

## 2016-07-09 NOTE — Progress Notes (Signed)
Subjective: Patient reports no significant abdominal pain.  She has a fair appetite.  Objective: Vital signs in last 24 hours: Temp:  [98.3 F (36.8 C)-100.9 F (38.3 C)] 98.4 F (36.9 C) (02/10 1224) Pulse Rate:  [79-111] 86 (02/10 1224) Resp:  [14-18] 18 (02/10 1224) BP: (98-141)/(37-99) 112/42 (02/10 1224) SpO2:  [94 %-100 %] 95 % (02/10 1224) Weight:  [76 kg (167 lb 8.8 oz)-77.1 kg (169 lb 15.6 oz)] 76 kg (167 lb 8.8 oz) (02/10 1110)  Intake/Output from previous day: 02/09 0701 - 02/10 0700 In: 800 [P.O.:480; I.V.:20; IV Piggyback:300] Out: 0  Intake/Output this shift: Total I/O In: -  Out: 1000 [Other:1000]  Physical Exam:  Constitutional: Vital signs reviewed. WD WN in NAD   Eyes: PERRL, No scleral icterus.   Cardiovascular: RRR Pulmonary/Chest: Normal effort Abdominal: Soft.  No significant tenderness   Lab Results:  Recent Labs  07/08/16 0356 07/09/16 0351 07/09/16 0924  HGB 7.7* 7.1* 7.1*  HCT 26.2* 24.7* 24.7*   BMET  Recent Labs  07/08/16 0356 07/09/16 0351  NA 132* 134*  K 4.1 4.6  CL 91* 90*  CO2 26 27  GLUCOSE 222* 179*  BUN 14 26*  CREATININE 4.51* 6.77*  CALCIUM 7.9* 8.2*   No results for input(s): LABPT, INR in the last 72 hours. No results for input(s): LABURIN in the last 72 hours. Results for orders placed or performed during the hospital encounter of 07/06/16  MRSA PCR Screening     Status: None   Collection Time: 07/07/16  8:21 AM  Result Value Ref Range Status   MRSA by PCR NEGATIVE NEGATIVE Final    Comment:        The GeneXpert MRSA Assay (FDA approved for NASAL specimens only), is one component of a comprehensive MRSA colonization surveillance program. It is not intended to diagnose MRSA infection nor to guide or monitor treatment for MRSA infections.   Culture, blood (Routine X 2) w Reflex to ID Panel     Status: None (Preliminary result)   Collection Time: 07/08/16 12:52 PM  Result Value Ref Range Status   Specimen Description BLOOD LEFT HAND  Final   Special Requests IN PEDIATRIC BOTTLE 4CC  Final   Culture NO GROWTH < 24 HOURS  Final   Report Status PENDING  Incomplete  Culture, blood (Routine X 2) w Reflex to ID Panel     Status: None (Preliminary result)   Collection Time: 07/08/16 12:52 PM  Result Value Ref Range Status   Specimen Description BLOOD LEFT HAND  Final   Special Requests BOTTLES DRAWN AEROBIC AND ANAEROBIC 5CC  Final   Culture NO GROWTH < 24 HOURS  Final   Report Status PENDING  Incomplete    Studies/Results: Dg Chest 2 View  Result Date: 07/07/2016 CLINICAL DATA:  Productive cough. EXAM: CHEST  2 VIEW COMPARISON:  08/22/2015 FINDINGS: Left suprahilar opacity with rounded appearance. Indistinct right infrahilar opacity with mild volume loss. No edema, effusion, or pneumothorax. Normal heart size and mediastinal contours. Left IJ central line with tip at the SVC level. IMPRESSION: 1. Left suprahilar pneumonia versus mass. Recommend short follow-up chest x-ray or chest CT. 2. Mild atelectasis and pleural fluid on the right. Electronically Signed   By: Monte Fantasia M.D.   On: 07/07/2016 14:30    Assessment/Plan:   Retroperitoneal bleed, most likely from right renal mass.  Currently stable.  Her hemoglobin has been stable.  She does have low-grade temperature and mild leukocytosis.  At  this point, I do not think repeat scanning is necessary.    We will continue to follow   LOS: 3 days   Franchot Gallo M 07/09/2016, 1:36 PM

## 2016-07-09 NOTE — Progress Notes (Signed)
[facility] Progress Note   Subjective: Seen on HD Tolerating well. No c/os no abd pain   Vitals:   07/09/16 0730 07/09/16 0800 07/09/16 0830 07/09/16 0900  BP: 105/66 (!) 141/99 (!) 106/41 (!) 130/49  Pulse: 97 90 87 85  Resp: '18 17 17 16  '$ Temp:  98.8 F (37.1 C)    TempSrc:  Oral    SpO2:      Weight:      Height:        Inpatient medications: . acetaminophen      . calcitRIOL      . azithromycin  500 mg Intravenous Q24H  . calcitRIOL  0.25 mcg Oral Q T,Th,Sa-HD  . calcium acetate  1,334 mg Oral TID WC  . cefTRIAXone (ROCEPHIN)  IV  1 g Intravenous Q24H  . ferric gluconate (FERRLECIT/NULECIT) IV  250 mg Intravenous Q T,Th,Sa-HD  . guaiFENesin  1,200 mg Oral BID  . insulin aspart  0-5 Units Subcutaneous QHS  . insulin aspart  0-9 Units Subcutaneous TID WC  . levothyroxine  75 mcg Oral QAC breakfast  . mouth rinse  15 mL Mouth Rinse BID  . multivitamin  1 tablet Oral QHS    sodium chloride, sodium chloride, acetaminophen **OR** acetaminophen, albuterol, alteplase, calcium carbonate (dosed in mg elemental calcium), camphor-menthol **AND** hydrOXYzine, docusate sodium, feeding supplement (NEPRO CARB STEADY), heparin, HYDROcodone-homatropine, lidocaine (PF), lidocaine, lidocaine-prilocaine, metoCLOPramide, ondansetron **OR** ondansetron (ZOFRAN) IV, oxyCODONE-acetaminophen, pentafluoroprop-tetrafluoroeth, sodium chloride flush, sorbitol, zolpidem  Exam: General: pleasant  elderly AAF uncomfortable Lungs:  Few crackles Breathing is unlabored. Heart: RRR with S1 S2.  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 Moves all extremities spontaneously. Dialysis Access: right upper AVF cannulated on HD   Dialysis Orders: [facility]  3h 62mn   2/2.25 bath   76kg   Hep none   RUA AVF calcitriol 0.25 Mircera 150 given 2/6 Recent labs: hgb 10.3 12% sat Jan, 15% Dec- s/o course of IV Fe for  January ferritin 1365 iPTH 307 Ca/P ok  Assessment: 1. Right retroperitoneal bleed -seen by urology, poor surg candidate conservative management for now. Hb trending down. Pain better.  2. Cough/- CXR with L suprahilar opacity - per primary on IV abx for pneumonia. Have ordered CT chest as this may be lung mass.  3. ESRD - TTS HD 4. Hypertension/volume  - BP controlled on norvasc 5 5. Anemia  - hgb 7.1 Tsat 7% expected  drop due to RPB-  she is s/p a course of Fe due to low tsat 12% - 150 Mircera given 2/6- Pt is JW- start Fe load q HD She refuses blood transfusion even in life-threatening situations, has been d/w her 6. Metabolic bone disease -  Continue calcitriol and 2 phoslo resume controlled 7. Nutrition - Renal diet/vitamins/protein supp    8. DM - per primary   Plan - HD TTS follow Hgb    OLynnda ChildPA-C CMerrimacPager [phone]/02/2017,9:18 AM  Pt seen, examined, agree w assess/plan as above with additions as indicated.  RKelly SplinterMD C[facility] pager [phone]   cell [phone]/02/2017, 1:23 PM       Recent Labs Lab 07/07/16 2000 07/08/16 0356 07/09/16 0351  NA 136 132* 134*  K 3.9 4.1 4.6  CL 94* 91* 90*  CO2 '27 26 27  '$ GLUCOSE 163* 222* 179*  BUN 11 14 26*  CREATININE 3.66* 4.51* 6.77*  CALCIUM 8.1* 7.9* 8.2*  PHOS 2.6 3.0 4.0    Recent Labs Lab 07/07/16 0236 07/07/16 2000 07/08/16 0356 07/09/16 0351  AST 20  --   --   --   ALT 20  --   --   --   ALKPHOS 60  --   --   --   BILITOT 0.6  --   --   --   PROT 6.4*  --   --   --   ALBUMIN 2.9* 3.1* 2.9* 2.6*    Recent Labs Lab 07/06/16 0742  07/07/16 2000 07/08/16 0356 07/09/16 0351  WBC 12.3*  < > 12.1* 11.6* 14.0*  NEUTROABS 10.1*  --   --  9.1*  --   HGB 9.5*  < > 7.8* 7.7* 7.1*  HCT 30.9*  < > 27.4* 26.2* 24.7*  MCV 77.4*  < > 77.8* 78.2 77.4*  PLT 188  < > 197 184 198  < > = values in this interval not displayed. Iron/TIBC/Ferritin/  %Sat    Component Value Date/Time   IRON 14 (L) 07/06/2016 1516   TIBC 213 (L) 07/06/2016 1516   FERRITIN 596 (H) 11/17/2009 1014   IRONPCTSAT 7 (L) 07/06/2016 1516

## 2016-07-10 DIAGNOSIS — R918 Other nonspecific abnormal finding of lung field: Secondary | ICD-10-CM | POA: Diagnosis present

## 2016-07-10 DIAGNOSIS — N2889 Other specified disorders of kidney and ureter: Secondary | ICD-10-CM | POA: Diagnosis present

## 2016-07-10 DIAGNOSIS — N281 Cyst of kidney, acquired: Secondary | ICD-10-CM

## 2016-07-10 DIAGNOSIS — J189 Pneumonia, unspecified organism: Secondary | ICD-10-CM | POA: Diagnosis present

## 2016-07-10 LAB — GLUCOSE, CAPILLARY
GLUCOSE-CAPILLARY: 212 mg/dL — AB (ref 65–99)
Glucose-Capillary: 184 mg/dL — ABNORMAL HIGH (ref 65–99)
Glucose-Capillary: 231 mg/dL — ABNORMAL HIGH (ref 65–99)
Glucose-Capillary: 239 mg/dL — ABNORMAL HIGH (ref 65–99)

## 2016-07-10 LAB — RENAL FUNCTION PANEL
Albumin: 2.5 g/dL — ABNORMAL LOW (ref 3.5–5.0)
Anion gap: 15 (ref 5–15)
BUN: 18 mg/dL (ref 6–20)
CHLORIDE: 90 mmol/L — AB (ref 101–111)
CO2: 28 mmol/L (ref 22–32)
CREATININE: 4.34 mg/dL — AB (ref 0.44–1.00)
Calcium: 8.1 mg/dL — ABNORMAL LOW (ref 8.9–10.3)
GFR calc Af Amer: 11 mL/min — ABNORMAL LOW (ref >60)
GFR calc non Af Amer: 9 mL/min — ABNORMAL LOW (ref >60)
GLUCOSE: 180 mg/dL — AB (ref 65–99)
Phosphorus: 4.2 mg/dL (ref 2.5–4.6)
Potassium: 3.8 mmol/L (ref 3.5–5.1)
Sodium: 133 mmol/L — ABNORMAL LOW (ref 135–145)

## 2016-07-10 MED ORDER — AMOXICILLIN-POT CLAVULANATE 500-125 MG PO TABS
1.0000 | ORAL_TABLET | ORAL | Status: DC
  Administered 2016-07-11 – 2016-07-12 (×2): 500 mg via ORAL
  Filled 2016-07-10 (×2): qty 1

## 2016-07-10 MED ORDER — AMOXICILLIN-POT CLAVULANATE 875-125 MG PO TABS: 1.0000 | ORAL_TABLET | Freq: Two times a day (BID) | ORAL | Status: DC

## 2016-07-10 MED ORDER — DARBEPOETIN ALFA 150 MCG/0.3ML IJ SOSY
150.0000 ug | PREFILLED_SYRINGE | INTRAMUSCULAR | Status: DC
  Administered 2016-07-12: 150 ug via INTRAVENOUS
  Filled 2016-07-10: qty 0.3

## 2016-07-10 NOTE — Consult Note (Signed)
Name: [name] MRN: [id number] DOB: 26-Mar-1946    ADMISSION DATE:  07/06/2016 CONSULTATION DATE:  2/11  REFERRING MD :  Nile Riggs [place] Silos  CHIEF COMPLAINT:  Lung mass  BRIEF PATIENT DESCRIPTION:  71 yo female with a plethora of health issues that are well documented who presented 2/7 with abd pain. She had extensive workup and a left upper lobe spiculated mass was found and PCCM was asked to arrange outpatient for FOB. [name] a Jehovah witness and her hemoglobin at time of this note was 7.1. She can follow up with Pulmonary for further evaluation as an outpatient by calling [phone] to arrange appointment. With her extensive collection health issue she may not be a candidate for FOB.   SIGNIFICANT EVENTS    HISTORY OF PRESENT ILLNESS:   71 yo female with a plethora of health issues that are well documented who presented 2/7 with abd pain. She had extensive workup and a left upper lobe spiculated mass was found and PCCM was asked to arrange outpatient for FOB. [name] a Jehovah witness and her hemoglobin at time of this note was 7.1. She can follow up with Pulmonary for further evaluation as an outpatient by calling [phone] to arrange appointment. With her extensive collection health issue she may not be a candidate for FOB.   PAST MEDICAL HISTORY :   has a past medical history of Arthritis; Bronchitis; COPD (chronic obstructive pulmonary disease) ([place]); Diabetes mellitus; Diarrhea; ESRD on hemodialysis ([place]); Family history of anesthesia complication; Hyperlipidemia; Hypertension; Hypothyroidism; Leg pain; Peripheral vascular disease ([place]); Refusal of blood transfusions as patient is Jehovah's Witness; Retroperitoneal bleeding (06/2016); Stroke [facility]); and Thyroid disease.  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   Prior to Admission medications   Medication Sig Start Date End Date Taking? Authorizing Provider  acetaminophen (TYLENOL) 650 MG CR tablet Take 1,300 mg by mouth every 8 (eight) hours as needed for pain.   Yes Historical Provider, MD  amLODipine (NORVASC) 5 MG tablet Take 1 tablet (5 mg total) by mouth daily. 06/04/12  Yes Ripudeep Krystal Eaton, MD  calcium acetate (PHOSLO) 667 MG capsule Take 1 capsule (667 mg total) by mouth 2 (two) times daily with a meal. 06/04/12  Yes Ripudeep K [name], MD  insulin aspart (NOVOLOG) 100 UNIT/ML injection Inject 0-15 Units into the skin every 4 (four) hours. CBG < 70: Drink juice; CBG 70 - 120: 0 units: CBG 121 - 150: 2 units; CBG 151 - 200: 3 units; CBG 201 - 250: 5 units; CBG 251 - 300: 8 units;CBG 301 - 350: 11 units; CBG 351 - 400: 15 units; CBG > 400 : 15 units and Call MD Patient taking differently: Inject 0-15 Units into the skin every 4 (four) hours as needed for high blood sugar. CBG < 70: Drink juice; CBG 70 - 120: 0 units: CBG 121 - 150: 2 units; CBG 151 - 200: 3 units; CBG 201 - 250: 5 units; CBG 251 - 300: 8 units;CBG 301 - 350: 11 units; CBG 351 - 400: 15 units; CBG > 400 : 15 units and Call MD 06/01/12  Yes Ripudeep K [name], MD  insulin glargine (LANTUS) 100 UNIT/ML injection Inject 30 Units into the skin at bedtime.   Yes Historical Provider, MD  levothyroxine (  SYNTHROID, LEVOTHROID) 75 MCG tablet Take 75 mcg by mouth daily.     Yes Historical Provider, MD  linagliptin (TRADJENTA) 5 MG TABS tablet Take 5 mg by mouth daily.   Yes Historical Provider, MD  metoCLOPramide (REGLAN) 5 MG tablet Take 1-2 tablets (5-10 mg total) by mouth every 8 (eight) hours as needed (if ondansetron (ZOFRAN) ineffective.). 06/01/12  Yes Ripudeep Krystal Eaton, MD  Multiple Vitamins-Minerals (RENAL) TABS Take 1 tablet by mouth daily.   Yes Historical Provider, MD  ondansetron (ZOFRAN) 4 MG  tablet Take 1 tablet (4 mg total) by mouth every 6 (six) hours as needed for nausea. 06/01/12  Yes Ripudeep Krystal Eaton, MD  benzonatate (TESSALON) 100 MG capsule Take 1 capsule (100 mg total) by mouth every 8 (eight) hours. Patient not taking: Reported on 07/06/2016 08/22/15   Tanna Furry, MD  HYDROcodone-acetaminophen (NORCO/VICODIN) 5-325 MG per tablet Take 1 tablet by mouth every 6 (six) hours as needed for moderate pain. Patient not taking: Reported on 07/06/2016 05/20/14   Carole Civil, MD  levofloxacin (LEVAQUIN) 500 MG tablet Take 1 tablet (500 mg total) by mouth daily. Patient not taking: Reported on 07/06/2016 08/22/15   Tanna Furry, MD  oxyCODONE (ROXICODONE) 5 MG immediate release tablet Take 1 tablet (5 mg total) by mouth every 6 (six) hours as needed for pain. Patient not taking: Reported on 07/06/2016 11/23/12   Hulen Shouts Rhyne, PA-C   Allergies  Allergen Reactions  . Contrast Media [Iodinated Diagnostic Agents] Itching  . Latex Itching    FAMILY HISTORY:  family history includes Diabetes in her mother. SOCIAL HISTORY:  reports that she quit smoking about 4 years ago. Her smoking use included Cigarettes. She has a 15.00 pack-year smoking history. She has never used smokeless tobacco. She reports that she does not drink alcohol or use drugs.  REVIEW OF SYSTEMS:   10 point review of system taken, please see HPI for positives and negatives.   SUBJECTIVE: No events overnight, no new complaints  VITAL SIGNS: Temp:  [98.1 F (36.7 C)-100 F (37.8 C)] 98.4 F (36.9 C) (02/11 0930) Pulse Rate:  [79-87] 84 (02/11 0930) Resp:  [18-20] 20 (02/11 0930) BP: (107-116)/(40-48) 115/46 (02/11 0930) SpO2:  [92 %-98 %] 98 % (02/11 0930) Weight:  [164 lb 11.2 oz (74.7 kg)] 164 lb 11.2 oz (74.7 kg) (02/10 2115)  PHYSICAL EXAMINATION: General:  Chronically ill appearing female, NAD Neuro:  Alert and interactive, moving all ext to command HEENT:  [place]/AT, PERRL, EOM-I and MMM Cardiovascular:  RRR,  Nl S1/S2, -M/R/G. Lungs:  CTA bilaterally Abdomen:  Soft, ND and +BS, tenderness to deep palpation Musculoskeletal:  -edema and -tenderness Skin:  Intact.   Recent Labs Lab 07/08/16 0356 07/09/16 0351 07/10/16 0407  NA 132* 134* 133*  K 4.1 4.6 3.8  CL 91* 90* 90*  CO2 '26 27 28  '$ BUN 14 26* 18  CREATININE 4.51* 6.77* 4.34*  GLUCOSE 222* 179* 180*    Recent Labs Lab 07/08/16 0356 07/09/16 0351 07/09/16 0924  HGB 7.7* 7.1* 7.1*  HCT 26.2* 24.7* 24.7*  WBC 11.6* 14.0* 14.3*  PLT 184 198 204   Ct Chest Wo Contrast  Result Date: 07/09/2016 CLINICAL DATA:  Possible mass on recent chest x-ray EXAM: CT CHEST WITHOUT CONTRAST TECHNIQUE: Multidetector CT imaging of the chest was performed following the standard protocol without IV contrast. COMPARISON:  07/07/2016 FINDINGS: Cardiovascular: Somewhat limited without IV contrast. Aortic calcifications are seen without aneurysmal dilatation. Coronary calcifications are seen.  Mild cardiac enlargement is noted. Left jugular central line is seen in satisfactory position. Mediastinum/Nodes: Rim calcified lesion is noted in the left thyroid this measures approximately 3 cm in greatest dimension. This is stable from previous exam dating back to 2011 and consistent with a benign etiology. No definitive hilar or mediastinal adenopathy is identified. The esophagus as visualized is within normal limits. Lungs/Pleura: Lungs are well aerated bilaterally with emphysematous changes. Mild atelectatic changes are noted in the right lower lobe. In the left upper lobe there is a somewhat ovoid spiculated lesion which measures at least 3.2 by 2.9 cm. Some focal extension towards the pleura is noted. 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 Upper Abdomen: Cystic changes are noted in both kidneys which have been better evaluated on recent CT examination. Changes from the known hematoma are again seen and stable. Musculoskeletal:  Degenerative changes of the thoracic spine are noted. 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 Bronchoscopic biopsy may be helpful for further evaluation. Stable left thyroid lesion likely of a benign etiology. Cystic changes in the kidneys as well as a retroperitoneal hematoma stable from recent exam. Electronically Signed   By: Inez Catalina M.D.   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 She had extensive workup and a left upper lobe spiculated mass was found and PCCM was asked to arrange outpatient for FOB. [name] a Jehovah witness and her hemoglobin at time of this note was 7.1. She can follow up with Pulmonary for further evaluation as an outpatient by calling [phone] to arrange appointment. With her extensive collection health issue she may not be a candidate for FOB.      PLAN:  Call office on monday [phone] and make an appointment.  Richardson Landry Minor ACNP Maryanna Shape PCCM Pager [phone] till 3 pm If no answer page [phone] 07/10/2016, 4:41 PM  Attending Note:  71 year old female [place] witness presenting with a retroperitoneal bleed with Hg of 7 who had an incidental lung finding of a LUL lung mass.  PCCM was called on consultation for a bronchoscopy.  On exam, lungs are clear but decreased at the bases.  I reviewed CT myself, LUL mass noted and approachable bronchoscopically.  However, the  patient had a retroperitoneal bleed now and Hg is borderline, I can not transfuse her incase of further bleeding from another procedure.  [name] also on levaquin.  Recommend finishing the course of levaquin.  Allow patient sometime to build her Hg then f/u with PCCM as outpatient for the mass as this is clearly not emergent and can be handled after the acute phase has been complete.  Plan was discussed with Dr. Grandville Silos and he is to arrange for a follow up 2-4 wks post discharge with PCCM as outpatient.  PCCM will sign off, please call back if needed.  Patient seen  and examined, agree with above note.  I dictated the care and orders written for this patient under my direction.  Rush Farmer, MD [phone]

## 2016-07-10 NOTE — Progress Notes (Addendum)
[facility] Progress Note   Subjective: in bed, no abd pain, tired of being in hospital    Vitals:   07/09/16 1700 07/09/16 2115 07/10/16 0651 07/10/16 0930  BP: (!) 107/40  (!) 116/48 (!) 115/46  Pulse: 87 86 79 84  Resp: '18 18 18 20  '$ Temp: 98.1 F (36.7 C) 100 F (37.8 C) 98.9 F (37.2 C) 98.4 F (36.9 C)  TempSrc: Oral Oral Oral Oral  SpO2: 96% 98% 92% 98%  Weight:  74.7 kg (164 lb 11.2 oz)    Height:        Inpatient medications: . azithromycin  500 mg Intravenous Q24H  . calcitRIOL  0.25 mcg Oral Q T,Th,Sa-HD  . calcium acetate  1,334 mg Oral TID WC  . cefTRIAXone (ROCEPHIN)  IV  1 g Intravenous Q24H  . feeding supplement (NEPRO CARB STEADY)  237 mL Oral BID BM  . ferric gluconate (FERRLECIT/NULECIT) IV  250 mg Intravenous Q T,Th,Sa-HD  . guaiFENesin  1,200 mg Oral BID  . insulin aspart  0-5 Units Subcutaneous QHS  . insulin aspart  0-9 Units Subcutaneous TID WC  . levothyroxine  75 mcg Oral QAC breakfast  . mouth rinse  15 mL Mouth Rinse BID  . multivitamin  1 tablet Oral QHS    acetaminophen **OR** acetaminophen, albuterol, calcium carbonate (dosed in mg elemental calcium), camphor-menthol **AND** hydrOXYzine, docusate sodium, feeding supplement (NEPRO CARB STEADY), HYDROcodone-homatropine, lidocaine, metoCLOPramide, ondansetron **OR** ondansetron (ZOFRAN) IV, oxyCODONE-acetaminophen, sodium chloride flush, sorbitol, zolpidem  Exam: General: pleasant  elderly AAF uncomfortable Lungs:  Few crackles Breathing is unlabored. Heart: RRR with S1 S2.  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 Moves all extremities spontaneously. Dialysis Access: right upper AVF cannulated on HD   Dialysis Orders: [place] TTS  3h 59mn   2/2.25 bath   76kg   Hep none   RUA AVF calcitriol 0.25 Mircera 150 given 2/6 Recent labs: hgb 10.3 12% sat Jan, 15% Dec- s/o course of IV Fe for January  ferritin 1365 iPTH 307 Ca/P ok  Assessment: 1. Right retroperitoneal bleed -seen by urology, poor surg candidate conservative management for now. Hb trending down. Pain better.  2. 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 Per primary.  3. ESRD - TTS HD 4. Hypertension/volume  - BP controlled on norvasc 5 5. Anemia  - hgb 7.1 Tsat 7% expected  drop due to RPB-  she is s/p a course of Fe due to low tsat 12%; started 1 gm Fe load here, is due two more 250 mg doses.  Last 150 Mircera given 2/6, will get darbe on Tues if still here 150ug.  Pt is JW and she refuses blood transfusion even in life-threatening situations, has been d/w her 6. Metabolic bone disease -  Continue calcitriol and 2 phoslo resume controlled 7. Nutrition - Renal diet/vitamins/protein supp    8. DM - per primary   Plan - HD TTS follow Hgb , IV Fe load, darbe tues if here   RKelly SplinterMD [facility] [phone]  07/10/2016, 1:30 PM        Recent Labs Lab 07/08/16 0356 07/09/16 0351 07/10/16 0407  NA 132* 134* 133*  K 4.1 4.6 3.8  CL 91* 90* 90*  CO2 '26 27 28  '$ GLUCOSE 222* 179* 180*  BUN 14 26* 18  CREATININE  4.51* 6.77* 4.34*  CALCIUM 7.9* 8.2* 8.1*  PHOS 3.0 4.0 4.2    Recent Labs Lab 07/07/16 0236  07/08/16 0356 07/09/16 0351 07/10/16 0407  AST 20  --   --   --   --   ALT 20  --   --   --   --   ALKPHOS 60  --   --   --   --   BILITOT 0.6  --   --   --   --   PROT 6.4*  --   --   --   --   ALBUMIN 2.9*  < > 2.9* 2.6* 2.5*  < > = values in this interval not displayed.  Recent Labs Lab 07/06/16 0742  07/08/16 0356 07/09/16 0351 07/09/16 0924  WBC 12.3*  < > 11.6* 14.0* 14.3*  NEUTROABS 10.1*  --  9.1*  --   --   HGB 9.5*  < > 7.7* 7.1* 7.1*  HCT 30.9*  < > 26.2* 24.7* 24.7*  MCV 77.4*  < > 78.2 77.4* 76.9*  PLT 188  < > 184 198 204  < > = values in this interval not displayed. Iron/TIBC/Ferritin/ %Sat    Component Value Date/Time   IRON 14  (L) 07/06/2016 1516   TIBC 213 (L) 07/06/2016 1516   FERRITIN 596 (H) 11/17/2009 1014   IRONPCTSAT 7 (L) 07/06/2016 1516

## 2016-07-10 NOTE — Progress Notes (Signed)
PROGRESS NOTE    [name]  JGO:[id number] DOB: 06-15-1945 DOA: 07/06/2016 PCP: [name], MD    Brief Narrative:  [name] is a 71 y.o. female with medical history significant of ESRD on dialysis (on Tuesday, Thursday, Saturday) who only had a partial dialysis session yesterday, anemia of ESRD, HTN who presents with flank pain that woke patient from sleep. Pain 9/10 and patient felt the need to sit up.  Pain was constant and patient states she has has emesis associated with the pain.  No fever, no chills, no chest pain or pressure, no shortness of breath.  Pain is localized and does not radiate; patient reports pain is dull and constant.  No increase in pain with movement. CT was done in January for evaluation of her appendix.  She was found to have a kidney mass at that time and followed up with a urologist.  Says that the urologist told her that the mass was stable and no further intervention was necessary.    ED Course: patient underwent CT renal stone study which showed Large right pararenal and retroperitoneal hematoma likely arising from rupture of a a right renal lesion, possibly hemorrhagic cyst or solid mass. A ruptured renal cell carcinoma should be excluded. Both kidneys demonstrate multiple hemorrhagic and nonhemorrhagic cysts. No hydronephrosis. Multiple tiny low-attenuation nodules throughout the liver similar to prior study. This could represent metastasis, infectious process, regenerative nodules   Assessment & Plan:   Principal Problem:   Retroperitoneal bleed Active Problems:   Mass of upper lobe of left lung   Diabetes mellitus type II, uncontrolled (HCC)   Iron deficiency anemia   Hypertension   Peripheral vascular disease (HCC)   RENAL DISEASE, CHRONIC, STAGE V   ESRD (end stage renal disease) (HCC)   Retroperitoneal bleeding   Acute blood loss anemia   Community acquired pneumonia of left lung   Hypothyroidism  #1 retroperitoneal bleed Per urology  likely from the right lower pole renal mass/cyst. Per urology patient not a good surgical candidate and recommended conservative treatment at this time. Hemoglobin trending down and currently at 7.1 from 9.5 on admission. Patient is a Jehovah witness and does not receive blood transfusions. Patient recently given IV iron. Follow H&H. Per urology.  #2 end-stage renal disease On hemodialysis Tuesdays Thursdays Saturdays. Patient received a brief 30 minutes treatment prior to admission secondary to diarrhea. No further diarrhea. Patient receiving hemodialysis. Per nephrology.  #3 acute blood loss anemia/anemia of chronic disease Secondary to problem #1. Patient received IV iron on 07/06/2015. Patient to getting IV iron during HD per nephrology. Follow H&H. Hemoglobin currently at 7.1 from 9.5 on admission. Patient is a Sales promotion account executive Witness and does not receive blood transfusions. Will draw labs in pediatric tubes.  #4 hypertension Blood pressure borderline and improved with discontinuation of Norvasc. Patient on hemodialysis.   #5 cough/probable pneumonia Patient noted to have a productive cough per nursing. Chest x-ray obtained with left suprahilar pneumonia versus mass. Sputum Gram stain and culture pending. Blood cultures pending. Urine Legionella and urine pneumococcus antigen pending. Change IV Rocephin and azithromycin to oral Augmentin. Continue Mucinex, nebulizer treatments.   #6 diabetes mellitus type 2 Hemoglobin A1c 6.21 07/06/2016. CBGs ranging from 184-239. Sliding scale insulin.  #7 hypothyroidism TSH is 0.655. Resumed home dose Synthroid.  #8 spiculated left upper lobe lung mass Per CT chest. Case has been discussed with pulmonary were recommending outpatient follow-up at this time for further evaluation and possible bronchoscopy as patient with  an acute illness with retroperitoneal bleeding, patient is a Sales promotion account executive Witness and doesn't accept blood transfusions and patient currently on  antibiotics. Pulmonary/critical care to formally consult.   DVT prophylaxis: SCDs Code Status: Full Family Communication: Updated patient and grandson at bedside. Disposition Plan: Home once hematoma has improved, hemoglobin stabilized, per urology and nephrology.   Consultants:   Nephrology: Dr.Schertz 07/06/2016  Urology: Dr. Matilde Sprang 07/06/2016  Interventional radiology: Dr. Kathlene Cote  Procedures:   CT stone protocol 07/06/2016.  Chest x-ray 07/07/2016  Fluoroscopy guided left jugular central line per Dr.Henn 07/07/2016   CT chest 07/09/2016  Antimicrobials:   IV azithromycin 07/07/2016>>>>> 07/10/2016  IV Rocephin 07/07/2016>>>>> 07/10/2016  Augmentin 07/10/2016   Subjective: Patient denies CP. No shortness of breath. Patient states back pain is improving. Patient feeling better. Patient states cough is improved. Patient complaining of some heel pain.   Objective: Vitals:   07/09/16 1700 07/09/16 2115 07/10/16 0651 07/10/16 0930  BP: (!) 107/40  (!) 116/48 (!) 115/46  Pulse: 87 86 79 84  Resp: '18 18 18 20  '$ Temp: 98.1 F (36.7 C) 100 F (37.8 C) 98.9 F (37.2 C) 98.4 F (36.9 C)  TempSrc: Oral Oral Oral Oral  SpO2: 96% 98% 92% 98%  Weight:  74.7 kg (164 lb 11.2 oz)    Height:        Intake/Output Summary (Last 24 hours) at 07/10/16 1312 Last data filed at 07/10/16 0931  Gross per 24 hour  Intake              970 ml  Output                0 ml  Net              970 ml   Filed Weights   07/09/16 0710 07/09/16 1110 07/09/16 2115  Weight: 77 kg (169 lb 12.1 oz) 76 kg (167 lb 8.8 oz) 74.7 kg (164 lb 11.2 oz)    Examination:  General exam: NAD Respiratory system: Clear to auscultation anterior lung fields. Respiratory effort normal. Cardiovascular system: S1 & S2 heard, RRR. No JVD, murmurs, rubs, gallops or clicks. No pedal edema. Gastrointestinal system: Abdomen is nondistended, soft and nontender. No organomegaly or masses felt. Normal bowel  sounds heard. Central nervous system: Alert and oriented. No focal neurological deficits. Extremities: Symmetric 5 x 5 power. Skin: No rashes, lesions or ulcers Psychiatry: Judgement and insight appear normal. Mood & affect appropriate.     Data Reviewed: I have personally reviewed following labs and imaging studies  CBC:  Recent Labs Lab 07/06/16 0742  07/07/16 1051 07/07/16 2000 07/08/16 0356 07/09/16 0351 07/09/16 0924  WBC 12.3*  < > 9.4 12.1* 11.6* 14.0* 14.3*  NEUTROABS 10.1*  --   --   --  9.1*  --   --   HGB 9.5*  < > 7.5* 7.8* 7.7* 7.1* 7.1*  HCT 30.9*  < > 25.8* 27.4* 26.2* 24.7* 24.7*  MCV 77.4*  < > 77.5* 77.8* 78.2 77.4* 76.9*  PLT 188  < > 185 197 184 198 204  < > = values in this interval not displayed. Basic Metabolic Panel:  Recent Labs Lab 07/07/16 0236 07/07/16 2000 07/08/16 0356 07/09/16 0351 07/10/16 0407  NA 136 136 132* 134* 133*  K 4.0 3.9 4.1 4.6 3.8  CL 93* 94* 91* 90* 90*  CO2 '28 27 26 27 28  '$ GLUCOSE 113* 163* 222* 179* 180*  BUN 25* 11 14 26* 18  CREATININE 6.17* 3.66* 4.51* 6.77* 4.34*  CALCIUM 8.0* 8.1* 7.9* 8.2* 8.1*  PHOS  --  2.6 3.0 4.0 4.2   GFR: Estimated Creatinine Clearance: 11.7 mL/min (by C-G formula based on SCr of 4.34 mg/dL (H)). Liver Function Tests:  Recent Labs Lab 07/07/16 0236 07/07/16 2000 07/08/16 0356 07/09/16 0351 07/10/16 0407  AST 20  --   --   --   --   ALT 20  --   --   --   --   ALKPHOS 60  --   --   --   --   BILITOT 0.6  --   --   --   --   PROT 6.4*  --   --   --   --   ALBUMIN 2.9* 3.1* 2.9* 2.6* 2.5*   No results for input(s): LIPASE, AMYLASE in the last 168 hours. No results for input(s): AMMONIA in the last 168 hours. Coagulation Profile: No results for input(s): INR, PROTIME in the last 168 hours. Cardiac Enzymes: No results for input(s): CKTOTAL, CKMB, CKMBINDEX, TROPONINI in the last 168 hours. BNP (last 3 results) No results for input(s): PROBNP in the last 8760  hours. HbA1C: No results for input(s): HGBA1C in the last 72 hours. CBG:  Recent Labs Lab 07/09/16 1221 07/09/16 1637 07/09/16 2114 07/10/16 0735 07/10/16 1124  GLUCAP 101* 162* 183* 184* 231*   Lipid Profile: No results for input(s): CHOL, HDL, LDLCALC, TRIG, CHOLHDL, LDLDIRECT in the last 72 hours. Thyroid Function Tests:  Recent Labs  07/07/16 1730  TSH 0.655   Anemia Panel: No results for input(s): VITAMINB12, FOLATE, FERRITIN, TIBC, IRON, RETICCTPCT in the last 72 hours. Sepsis Labs: No results for input(s): PROCALCITON, LATICACIDVEN in the last 168 hours.  Recent Results (from the past 240 hour(s))  MRSA PCR Screening     Status: None   Collection Time: 07/07/16  8:21 AM  Result Value Ref Range Status   MRSA by PCR NEGATIVE NEGATIVE Final    Comment:        The GeneXpert MRSA Assay (FDA approved for NASAL specimens only), is one component of a comprehensive MRSA colonization surveillance program. It is not intended to diagnose MRSA infection nor to guide or monitor treatment for MRSA infections.   Culture, blood (Routine X 2) w Reflex to ID Panel     Status: None (Preliminary result)   Collection Time: 07/08/16 12:52 PM  Result Value Ref Range Status   Specimen Description BLOOD LEFT HAND  Final   Special Requests IN PEDIATRIC BOTTLE 4CC  Final   Culture NO GROWTH < 24 HOURS  Final   Report Status PENDING  Incomplete  Culture, blood (Routine X 2) w Reflex to ID Panel     Status: None (Preliminary result)   Collection Time: 07/08/16 12:52 PM  Result Value Ref Range Status   Specimen Description BLOOD LEFT HAND  Final   Special Requests BOTTLES DRAWN AEROBIC AND ANAEROBIC 5CC  Final   Culture NO GROWTH < 24 HOURS  Final   Report Status PENDING  Incomplete         Radiology Studies: Ct Chest Wo Contrast  Result Date: 07/09/2016 CLINICAL DATA:  Possible mass on recent chest x-ray EXAM: CT CHEST WITHOUT CONTRAST TECHNIQUE: Multidetector CT imaging  of the chest was performed following the standard protocol without IV contrast. COMPARISON:  07/07/2016 FINDINGS: Cardiovascular: Somewhat limited without IV contrast. Aortic calcifications are seen without aneurysmal dilatation. Coronary calcifications are seen. Mild cardiac  enlargement is noted. Left jugular central line is seen in satisfactory position. Mediastinum/Nodes: Rim calcified lesion is noted in the left thyroid this measures approximately 3 cm in greatest dimension. This is stable from previous exam dating back to 2011 and consistent with a benign etiology. No definitive hilar or mediastinal adenopathy is identified. The esophagus as visualized is within normal limits. Lungs/Pleura: Lungs are well aerated bilaterally with emphysematous changes. Mild atelectatic changes are noted in the right lower lobe. In the left upper lobe there is a somewhat ovoid spiculated lesion which measures at least 3.2 by 2.9 cm. Some focal extension towards the pleura is noted. 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 Upper Abdomen: Cystic changes are noted in both kidneys which have been better evaluated on recent CT examination. Changes from the known hematoma are again seen and stable. Musculoskeletal: Degenerative changes of the thoracic spine are noted. 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 Bronchoscopic biopsy may be helpful for further evaluation. Stable left thyroid lesion likely of a benign etiology. Cystic changes in the kidneys as well as a retroperitoneal hematoma stable from recent exam. Electronically Signed   By: Inez Catalina M.D.   On: 07/09/2016 16:19        Scheduled Meds: . azithromycin  500 mg Intravenous Q24H  . calcitRIOL  0.25 mcg Oral Q T,Th,Sa-HD  . calcium acetate  1,334 mg Oral TID WC  . cefTRIAXone (ROCEPHIN)  IV  1 g Intravenous Q24H  . feeding supplement (NEPRO CARB STEADY)  237 mL Oral BID BM   . ferric gluconate (FERRLECIT/NULECIT) IV  250 mg Intravenous Q T,Th,Sa-HD  . guaiFENesin  1,200 mg Oral BID  . insulin aspart  0-5 Units Subcutaneous QHS  . insulin aspart  0-9 Units Subcutaneous TID WC  . levothyroxine  75 mcg Oral QAC breakfast  . mouth rinse  15 mL Mouth Rinse BID  . multivitamin  1 tablet Oral QHS   Continuous Infusions:   LOS: 4 days    Time spent: 58 minutes    Roye Gustafson, MD [facility] Pager [phone]  If 7PM-7AM, please contact night-coverage [email or web address] Password [email or web address] 07/10/2016, 1:12 PM

## 2016-07-10 NOTE — Progress Notes (Signed)
Subjective: Patient reports no complaints. No flank or abd pain.   Objective: Vital signs in last 24 hours: Temp:  [98.1 F (36.7 C)-100 F (37.8 C)] 98.4 F (36.9 C) (02/11 0930) Pulse Rate:  [79-87] 84 (02/11 0930) Resp:  [18-20] 20 (02/11 0930) BP: (107-116)/(40-48) 115/46 (02/11 0930) SpO2:  [92 %-98 %] 98 % (02/11 0930) Weight:  [74.7 kg (164 lb 11.2 oz)] 74.7 kg (164 lb 11.2 oz) (02/10 2115)  Intake/Output from previous day: 02/10 0701 - 02/11 0700 In: 850 [P.O.:420; I.V.:10; IV Piggyback:420] Out: 1000  Intake/Output this shift: Total I/O In: 360 [P.O.:360] Out: 0   Physical Exam:  General: NAD Abd - soft, NT No CVAT  Lab Results:  Recent Labs  07/08/16 0356 07/09/16 0351 07/09/16 0924  HGB 7.7* 7.1* 7.1*  HCT 26.2* 24.7* 24.7*   BMET  Recent Labs  07/09/16 0351 07/10/16 0407  NA 134* 133*  K 4.6 3.8  CL 90* 90*  CO2 27 28  GLUCOSE 179* 180*  BUN 26* 18  CREATININE 6.77* 4.34*  CALCIUM 8.2* 8.1*   No results for input(s): LABPT, INR in the last 72 hours. No results for input(s): LABURIN in the last 72 hours. Results for orders placed or performed during the hospital encounter of 07/06/16  MRSA PCR Screening     Status: None   Collection Time: 07/07/16  8:21 AM  Result Value Ref Range Status   MRSA by PCR NEGATIVE NEGATIVE Final    Comment:        The GeneXpert MRSA Assay (FDA approved for NASAL specimens only), is one component of a comprehensive MRSA colonization surveillance program. It is not intended to diagnose MRSA infection nor to guide or monitor treatment for MRSA infections.   Culture, blood (Routine X 2) w Reflex to ID Panel     Status: None (Preliminary result)   Collection Time: 07/08/16 12:52 PM  Result Value Ref Range Status   Specimen Description BLOOD LEFT HAND  Final   Special Requests IN PEDIATRIC BOTTLE 4CC  Final   Culture NO GROWTH 2 DAYS  Final   Report Status PENDING  Incomplete  Culture, blood (Routine X  2) w Reflex to ID Panel     Status: None (Preliminary result)   Collection Time: 07/08/16 12:52 PM  Result Value Ref Range Status   Specimen Description BLOOD LEFT HAND  Final   Special Requests BOTTLES DRAWN AEROBIC AND ANAEROBIC 5CC  Final   Culture NO GROWTH 2 DAYS  Final   Report Status PENDING  Incomplete    Studies/Results: Ct Chest Wo Contrast  Result Date: 07/09/2016 CLINICAL DATA:  Possible mass on recent chest x-ray EXAM: CT CHEST WITHOUT CONTRAST TECHNIQUE: Multidetector CT imaging of the chest was performed following the standard protocol without IV contrast. COMPARISON:  07/07/2016 FINDINGS: Cardiovascular: Somewhat limited without IV contrast. Aortic calcifications are seen without aneurysmal dilatation. Coronary calcifications are seen. Mild cardiac enlargement is noted. Left jugular central line is seen in satisfactory position. Mediastinum/Nodes: Rim calcified lesion is noted in the left thyroid this measures approximately 3 cm in greatest dimension. This is stable from previous exam dating back to 2011 and consistent with a benign etiology. No definitive hilar or mediastinal adenopathy is identified. The esophagus as visualized is within normal limits. Lungs/Pleura: Lungs are well aerated bilaterally with emphysematous changes. Mild atelectatic changes are noted in the right lower lobe. In the left upper lobe there is a somewhat ovoid spiculated lesion which measures at least  3.2 by 2.9 cm. Some focal extension towards the pleura is noted. 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 Upper Abdomen: Cystic changes are noted in both kidneys which have been better evaluated on recent CT examination. Changes from the known hematoma are again seen and stable. Musculoskeletal: Degenerative changes of the thoracic spine are noted. 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 Bronchoscopic biopsy may be helpful for  further evaluation. Stable left thyroid lesion likely of a benign etiology. Cystic changes in the kidneys as well as a retroperitoneal hematoma stable from recent exam. Electronically Signed   By: Inez Catalina M.D.   On: 07/09/2016 16:19    Assessment/Plan: 1 - right RP bleed/hematoma - incompletely imaged on Chest CT yesterday, but what was seen was stable. Hgb stable at 7.1. Continue surveillance.    2 - Bilateral renal masses- not candidate for curative therapy at this time, continue surveillance.    3 - End Stage Renal Disease - appreciate in house management from nephrology.    LOS: 4 days   [name] 07/10/2016, 4:16 PM

## 2016-07-11 LAB — RENAL FUNCTION PANEL
Albumin: 2.5 g/dL — ABNORMAL LOW (ref 3.5–5.0)
Anion gap: 14 (ref 5–15)
BUN: 34 mg/dL — ABNORMAL HIGH (ref 6–20)
CO2: 28 mmol/L (ref 22–32)
Calcium: 8.2 mg/dL — ABNORMAL LOW (ref 8.9–10.3)
Chloride: 91 mmol/L — ABNORMAL LOW (ref 101–111)
Creatinine, Ser: 6.41 mg/dL — ABNORMAL HIGH (ref 0.44–1.00)
GFR calc Af Amer: 7 mL/min — ABNORMAL LOW (ref >60)
GFR calc non Af Amer: 6 mL/min — ABNORMAL LOW (ref >60)
Glucose, Bld: 197 mg/dL — ABNORMAL HIGH (ref 65–99)
Phosphorus: 4.3 mg/dL (ref 2.5–4.6)
Potassium: 3.9 mmol/L (ref 3.5–5.1)
Sodium: 133 mmol/L — ABNORMAL LOW (ref 135–145)

## 2016-07-11 LAB — CBC
HEMATOCRIT: 23.3 % — AB (ref 36.0–46.0)
HEMOGLOBIN: 6.7 g/dL — AB (ref 12.0–15.0)
MCH: 22 pg — ABNORMAL LOW (ref 26.0–34.0)
MCHC: 28.8 g/dL — AB (ref 30.0–36.0)
MCV: 76.4 fL — ABNORMAL LOW (ref 78.0–100.0)
Platelets: 216 10*3/uL (ref 150–400)
RBC: 3.05 MIL/uL — ABNORMAL LOW (ref 3.87–5.11)
RDW: 20.1 % — AB (ref 11.5–15.5)
WBC: 12.5 10*3/uL — AB (ref 4.0–10.5)

## 2016-07-11 LAB — GLUCOSE, CAPILLARY
GLUCOSE-CAPILLARY: 213 mg/dL — AB (ref 65–99)
GLUCOSE-CAPILLARY: 192 mg/dL — AB (ref 65–99)
GLUCOSE-CAPILLARY: 167 mg/dL — AB (ref 65–99)
Glucose-Capillary: 186 mg/dL — ABNORMAL HIGH (ref 65–99)

## 2016-07-11 MED ORDER — INSULIN GLARGINE 100 UNIT/ML ~~LOC~~ SOLN
8.0000 [IU] | Freq: Every day | SUBCUTANEOUS | Status: DC
  Administered 2016-07-11 – 2016-07-12 (×2): 8 [IU] via SUBCUTANEOUS
  Filled 2016-07-11 (×2): qty 0.08

## 2016-07-11 NOTE — Progress Notes (Signed)
Inpatient Diabetes Program Recommendations  AACE/ADA: New Consensus Statement on Inpatient Glycemic Control (2015)  Target Ranges:  Prepandial:   less than 140 mg/dL      Peak postprandial:   less than 180 mg/dL (1-2 hours)      Critically ill patients:  140 - 180 mg/dL   Results for [name], [name] (MRN [id number]) as of 07/11/2016 12:15  Ref. Range 07/10/2016 07:35 07/10/2016 11:24 07/10/2016 17:14 07/10/2016 20:44 07/11/2016 08:17  Glucose-Capillary Latest Ref Range: 65 - 99 mg/dL 184 (H) 231 (H) 239 (H) 212 (H) 213 (H)   Review of Glycemic Control  Diabetes history: DM 2 Outpatient Diabetes medications: Lantus 30, Novolog 0-15 units PRN for high blood sugar, Tradjenta 5 mg Daily Current orders for Inpatient glycemic control: Novolog Sensitive + HS  Inpatient Diabetes Program Recommendations:   Glucose trend in the 200's more consistently. Patient on Lantus at home. Please consider starting a low dose of patient's basal insulin, Lantus 8 units Q24hours.  Thanks,  Tama Headings RN, MSN, [facility] Inpatient Diabetes Coordinator Team Pager [phone] (8a-5p)

## 2016-07-11 NOTE — Care Management Important Message (Signed)
Important Message  Patient Details  Name: [name] MRN: [id number] Date of Birth: [birth year]   Medicare Important Message Given:  Yes    Orbie Pyo 07/11/2016, 4:37 PM

## 2016-07-11 NOTE — Care Management Note (Addendum)
Case Management Note  Patient Details  Name: [name] MRN: [id number] Date of Birth: [birth year]  Subjective/Objective:                 Patient from home, hematoma, conservative treatment is JW- no blood products. Current O2 demand, non dependent. L lung mass pulm following (likely OP follow up, can't bronch at this time). PT eval pending. Patient provided with list of [facility] providers. She states that she has [facility]HA 5 days a week 3 hours a day and her son and grandchildren assist her.    Action/Plan:  CM will continue to follow. Needs HH orders and referral, watch for O2.  Addendum 07/12/16 O2 sat qualifying note 2/12, referral made to [facility] to deliver tank for transport home to room and new O2 set up at home. Patient would like to use [facility] for [facility], referral made to [facility] clinical liaison for [facility] RN and PT, awaiting orders. Anticipate DC today.  Expected Discharge Date:                  Expected Discharge Plan:  [place]  In-House Referral:     Discharge planning Services  CM Consult  Post Acute Care Choice:    Choice offered to:     DME Arranged:    DME Agency:     HH Arranged:    [place] Agency:     Status of Service:  In process, will continue to follow  If discussed at Long Length of Stay Meetings, dates discussed:    Additional Comments:  Carles Collet, RN 07/11/2016, 1:10 PM

## 2016-07-11 NOTE — Evaluation (Signed)
Physical Therapy Evaluation Patient Details Name: [name] MRN: [id number] DOB: 01-17-1946 Today's Date: 07/11/2016   History of Present Illness  71 y.o. female with medical history significant of ESRD on dialysis (on Tuesday, Thursday, Saturday), anemia of ESRD, HTN, PVD, DM, stroke, L BKA and admitted for retroperitoneal bleed; acute blood loss anemia/anemia of chronic disease.  Pt is a Jehovah's Witness.   IV iron during HD per nephrology  Clinical Impression  Pt admitted with above diagnosis. Pt currently with functional limitations due to the deficits listed below (see PT Problem List). Pt will benefit from skilled PT to increase their independence and safety with mobility to allow discharge to the venue listed below.   Pt assisted to sitting EOB and performed a couple exercises.  Pt denies dizziness.  Pt states her son and grandson have been assisting her at home and can do so upon d/c.  Recommend SNF, if family is not able to provide current level of care.  Pt prefers to d/c home.     Follow Up Recommendations Supervision/Assistance - 24 hour;SNF;Home health PT (pt states her son and grandson can assist at home, SNF if they are unable)    Equipment Recommendations  None recommended by PT    Recommendations for Other Services       Precautions / Restrictions Precautions Precautions: Fall Precaution Comments: monitor sats (dropped on room air per previous RN note)     Mobility  Bed Mobility Overal bed mobility: Needs Assistance Bed Mobility: Supine to Sit;Sit to Supine     Supine to sit: Mod assist Sit to supine: Min guard   General bed mobility comments: assist for trunk upright, able to return to supine by sidelying first, difficulty bringing R LE onto bed however no assist required  Transfers                 General transfer comment: pt declined today, would like to start transfers when her w/c arrives (son bringing from home tonight)  Ambulation/Gait                 Stairs            Wheelchair Mobility    Modified Rankin (Stroke Patients Only)       Balance Overall balance assessment: Needs assistance Sitting-balance support: No upper extremity supported;Feet unsupported Sitting balance-Leahy Scale: Fair         Standing balance comment: likely poor to zero - requiring assist for transfers prior to arrival                             Pertinent Vitals/Pain Pain Assessment: No/denies pain    Home Living Family/patient expects to be discharged to:: Private residence Living Arrangements: Other relatives Available Help at Discharge: Family;Available 24 hours/day Type of Home: House Home Access: Level entry     Home Layout: One level Home Equipment: Walker - 2 wheels;Wheelchair - manual      Prior Function Level of Independence: Needs assistance   Gait / Transfers Assistance Needed: pt reports some assist required from her family for transfers however recently requiring extensive assist, transfer from bed to w/c with transfer board  ADL's / Homemaking Assistance Needed: reports she transfer to toilet and into shower with assistance        Hand Dominance        Extremity/Trunk Assessment        Lower Extremity Assessment Lower Extremity Assessment:  Generalized weakness;LLE deficits/detail;RLE deficits/detail RLE Deficits / Details: grossly at least 3/5 except limited ROM in ankle LLE Deficits / Details: hx BKA, knee flexion approx 95*, lacking 3-4* of extension, states she has prosthesis but never wears       Communication   Communication: No difficulties  Cognition Arousal/Alertness: Awake/alert Behavior During Therapy: WFL for tasks assessed/performed Overall Cognitive Status: Within Functional Limits for tasks assessed                      General Comments General comments (skin integrity, edema, etc.): Pt reports no skin breakdown to her knowledge, states her son "is  like a nurse" and does check for this.  Pt encouraged to turn frequently for pressure relief.    Exercises General Exercises - Lower Extremity Ankle Circles/Pumps: AROM;Limitations;10 reps;Right Ankle Circles/Pumps Limitations: limited AROM Long Arc Quad: AROM;Both;10 reps;Seated Hip Flexion/Marching: AROM;Both;10 reps;Seated   Assessment/Plan    PT Assessment Patient needs continued PT services  PT Problem List Decreased strength;Decreased activity tolerance;Decreased balance;Decreased mobility          PT Treatment Interventions DME instruction;Therapeutic activities;Therapeutic exercise;Functional mobility training;Balance training;Wheelchair mobility training;Patient/family education    PT Goals (Current goals can be found in the Care Plan section)  Acute Rehab PT Goals Patient Stated Goal: home with family assist PT Goal Formulation: With patient Time For Goal Achievement: 07/25/16 Potential to Achieve Goals: Good    Frequency Min 2X/week   Barriers to discharge        Co-evaluation               End of Session Equipment Utilized During Treatment: Oxygen (remained on O2 during session) Activity Tolerance: Patient tolerated treatment well Patient left: in bed;with call bell/phone within reach;with bed alarm set           Time: 3202-3343 PT Time Calculation (min) (ACUTE ONLY): 15 min   Charges:   PT Evaluation $PT Eval Moderate Complexity: 1 Procedure     PT G Codes:        Tiffney Haughton,KATHrine E 07/11/2016, 3:13 PM Carmelia Bake, PT, DPT 07/11/2016 Pager: [phone]

## 2016-07-11 NOTE — Progress Notes (Signed)
PROGRESS NOTE    [name]  QQI:[id number] DOB: [birth year] DOA: 07/06/2016 PCP: [name], MD    Brief Narrative:  [name] is a 71 y.o. female with medical history significant of ESRD on dialysis (on Tuesday, Thursday, Saturday) who only had a partial dialysis session yesterday, anemia of ESRD, HTN who presents with flank pain that woke patient from sleep. Pain 9/10 and patient felt the need to sit up.  Pain was constant and patient states she has has emesis associated with the pain.  No fever, no chills, no chest pain or pressure, no shortness of breath.  Pain is localized and does not radiate; patient reports pain is dull and constant.  No increase in pain with movement. CT was done in January for evaluation of her appendix.  She was found to have a kidney mass at that time and followed up with a urologist.  Says that the urologist told her that the mass was stable and no further intervention was necessary.    ED Course: patient underwent CT renal stone study which showed Large right pararenal and retroperitoneal hematoma likely arising from rupture of a a right renal lesion, possibly hemorrhagic cyst or solid mass. A ruptured renal cell carcinoma should be excluded. Both kidneys demonstrate multiple hemorrhagic and nonhemorrhagic cysts. No hydronephrosis. Multiple tiny low-attenuation nodules throughout the liver similar to prior study. This could represent metastasis, infectious process, regenerative nodules   Assessment & Plan:   Principal Problem:   Retroperitoneal bleed Active Problems:   Lung mass   Diabetes mellitus type II, uncontrolled (HCC)   Iron deficiency anemia   Hypertension   Peripheral vascular disease (HCC)   RENAL DISEASE, CHRONIC, STAGE V   ESRD (end stage renal disease) (HCC)   Retroperitoneal bleeding   Acute blood loss anemia   Community acquired pneumonia of left lung   Hypothyroidism   Community acquired pneumonia   Renal cyst, right   Renal  hemorrhage, right   Bilateral renal masses  #1 retroperitoneal bleed secondary to bilateral renal masses Per urology likely from the right lower pole renal mass/cyst. Per urology patient not a good surgical candidate and recommended conservative treatment at this time. Hemoglobin trending down and currently at 6.7 from 9.5 on admission. Patient is a Jehovah witness and does not receive blood transfusions. Patient receiving IV iron during hemodialysis. Per urology outpatient surveillance of renal masses. Follow H&H. Per urology.  #2 end-stage renal disease On hemodialysis Tuesdays Thursdays Saturdays. Patient received a brief 30 minutes treatment prior to admission secondary to diarrhea. No further diarrhea. Patient receiving hemodialysis. Per nephrology.  #3 acute blood loss anemia/anemia of chronic disease Secondary to problem #1. Patient received IV iron on 07/06/2015. Patient to getting IV iron during HD per nephrology. Follow H&H. Hemoglobin currently at 6.7 from 9.5 on admission. Patient is a Sales promotion account executive Witness and does not receive blood transfusions. Will draw labs in pediatric tubes.  #4 hypertension Blood pressure borderline and improved with discontinuation of Norvasc. Patient on hemodialysis.   #5 cough/probable pneumonia Patient noted to have a productive cough per nursing. Chest x-ray obtained with left suprahilar pneumonia versus mass. Sputum Gram stain and culture pending. Blood cultures pending. Urine Legionella and urine pneumococcus antigen pending. Changed IV Rocephin and azithromycin to oral Augmentin. Continue Mucinex, nebulizer treatments.   #6 diabetes mellitus type 2 Hemoglobin A1c 6.21 07/06/2016. CBGs ranging from 213-239. Place on Lantus 8 units daily. Sliding scale insulin.  #7 hypothyroidism TSH is 0.655. Resumed home dose  Synthroid.  #8 spiculated left upper lobe lung mass Per CT chest. Case has been discussed with pulmonary were recommending outpatient follow-up  at this time for further evaluation and possible bronchoscopy as patient with an acute illness with retroperitoneal bleeding, patient is a Jehovah's Witness and doesn't accept blood transfusions and patient currently on antibiotics. Pulmonary/critical care to formally consult.   DVT prophylaxis: SCDs Code Status: Full Family Communication: Updated patient. No family at bedside. Disposition Plan: Home once hematoma has improved, hemoglobin stabilized, per urology and nephrology, hopefully tomorrow after HD.   Consultants:   Nephrology: Dr.Schertz 07/06/2016  Urology: Dr. Matilde Sprang 07/06/2016  Interventional radiology: Dr. Kathlene Cote  Procedures:   CT stone protocol 07/06/2016.  Chest x-ray 07/07/2016  Fluoroscopy guided left jugular central line per Dr.Henn 07/07/2016   CT chest 07/09/2016  Antimicrobials:   IV azithromycin 07/07/2016>>>>> 07/10/2016  IV Rocephin 07/07/2016>>>>> 07/10/2016  Augmentin 07/10/2016   Subjective: Patient denies CP. No shortness of breath. Patient states back pain is improving. Patient feeling better. Patient states cough is improved.   Objective: Vitals:   07/10/16 1742 07/10/16 2038 07/11/16 0526 07/11/16 0938  BP: 121/75 130/77 (!) 125/107 (!) 115/39  Pulse: 84 80 (!) 51 70  Resp: '20 18 16 18  '$ Temp: 98.5 F (36.9 C) 98.5 F (36.9 C) 97.8 F (36.6 C) 98.3 F (36.8 C)  TempSrc: Oral Oral Oral Oral  SpO2: 98% 91% 99% 100%  Weight:  74.5 kg (164 lb 3.9 oz)    Height:        Intake/Output Summary (Last 24 hours) at 07/11/16 1325 Last data filed at 07/11/16 0900  Gross per 24 hour  Intake              430 ml  Output                0 ml  Net              430 ml   Filed Weights   07/09/16 1110 07/09/16 2115 07/10/16 2038  Weight: 76 kg (167 lb 8.8 oz) 74.7 kg (164 lb 11.2 oz) 74.5 kg (164 lb 3.9 oz)    Examination:  General exam: NAD Respiratory system: Clear to auscultation anterior lung fields. Respiratory effort  normal. Cardiovascular system: S1 & S2 heard, RRR. No JVD, murmurs, rubs, gallops or clicks. No pedal edema. Gastrointestinal system: Abdomen is nondistended, soft and nontender. No organomegaly or masses felt. Normal bowel sounds heard. Central nervous system: Alert and oriented. No focal neurological deficits. Extremities: Symmetric 5 x 5 power. Skin: No rashes, lesions or ulcers Psychiatry: Judgement and insight appear normal. Mood & affect appropriate.     Data Reviewed: I have personally reviewed following labs and imaging studies  CBC:  Recent Labs Lab 07/06/16 0742  07/07/16 2000 07/08/16 0356 07/09/16 0351 07/09/16 0924 07/11/16 0422  WBC 12.3*  < > 12.1* 11.6* 14.0* 14.3* 12.5*  NEUTROABS 10.1*  --   --  9.1*  --   --   --   HGB 9.5*  < > 7.8* 7.7* 7.1* 7.1* 6.7*  HCT 30.9*  < > 27.4* 26.2* 24.7* 24.7* 23.3*  MCV 77.4*  < > 77.8* 78.2 77.4* 76.9* 76.4*  PLT 188  < > 197 184 198 204 216  < > = values in this interval not displayed. Basic Metabolic Panel:  Recent Labs Lab 07/07/16 2000 07/08/16 0356 07/09/16 0351 07/10/16 0407 07/11/16 0422  NA 136 132* 134* 133* 133*  K 3.9 4.1  4.6 3.8 3.9  CL 94* 91* 90* 90* 91*  CO2 '27 26 27 28 28  '$ GLUCOSE 163* 222* 179* 180* 197*  BUN 11 14 26* 18 34*  CREATININE 3.66* 4.51* 6.77* 4.34* 6.41*  CALCIUM 8.1* 7.9* 8.2* 8.1* 8.2*  PHOS 2.6 3.0 4.0 4.2 4.3   GFR: Estimated Creatinine Clearance: 7.9 mL/min (by C-G formula based on SCr of 6.41 mg/dL (H)). Liver Function Tests:  Recent Labs Lab 07/07/16 0236 07/07/16 2000 07/08/16 0356 07/09/16 0351 07/10/16 0407 07/11/16 0422  AST 20  --   --   --   --   --   ALT 20  --   --   --   --   --   ALKPHOS 60  --   --   --   --   --   BILITOT 0.6  --   --   --   --   --   PROT 6.4*  --   --   --   --   --   ALBUMIN 2.9* 3.1* 2.9* 2.6* 2.5* 2.5*   No results for input(s): LIPASE, AMYLASE in the last 168 hours. No results for input(s): AMMONIA in the last 168  hours. Coagulation Profile: No results for input(s): INR, PROTIME in the last 168 hours. Cardiac Enzymes: No results for input(s): CKTOTAL, CKMB, CKMBINDEX, TROPONINI in the last 168 hours. BNP (last 3 results) No results for input(s): PROBNP in the last 8760 hours. HbA1C: No results for input(s): HGBA1C in the last 72 hours. CBG:  Recent Labs Lab 07/10/16 1124 07/10/16 1714 07/10/16 2044 07/11/16 0817 07/11/16 1242  GLUCAP 231* 239* 212* 213* 192*   Lipid Profile: No results for input(s): CHOL, HDL, LDLCALC, TRIG, CHOLHDL, LDLDIRECT in the last 72 hours. Thyroid Function Tests: No results for input(s): TSH, T4TOTAL, FREET4, T3FREE, THYROIDAB in the last 72 hours. Anemia Panel: No results for input(s): VITAMINB12, FOLATE, FERRITIN, TIBC, IRON, RETICCTPCT in the last 72 hours. Sepsis Labs: No results for input(s): PROCALCITON, LATICACIDVEN in the last 168 hours.  Recent Results (from the past 240 hour(s))  MRSA PCR Screening     Status: None   Collection Time: 07/07/16  8:21 AM  Result Value Ref Range Status   MRSA by PCR NEGATIVE NEGATIVE Final    Comment:        The GeneXpert MRSA Assay (FDA approved for NASAL specimens only), is one component of a comprehensive MRSA colonization surveillance program. It is not intended to diagnose MRSA infection nor to guide or monitor treatment for MRSA infections.   Culture, blood (Routine X 2) w Reflex to ID Panel     Status: None (Preliminary result)   Collection Time: 07/08/16 12:52 PM  Result Value Ref Range Status   Specimen Description BLOOD LEFT HAND  Final   Special Requests IN PEDIATRIC BOTTLE 4CC  Final   Culture NO GROWTH 3 DAYS  Final   Report Status PENDING  Incomplete  Culture, blood (Routine X 2) w Reflex to ID Panel     Status: None (Preliminary result)   Collection Time: 07/08/16 12:52 PM  Result Value Ref Range Status   Specimen Description BLOOD LEFT HAND  Final   Special Requests BOTTLES DRAWN AEROBIC  AND ANAEROBIC 5CC  Final   Culture NO GROWTH 3 DAYS  Final   Report Status PENDING  Incomplete         Radiology Studies: Ct Chest Wo Contrast  Result Date: 07/09/2016 CLINICAL DATA:  Possible  mass on recent chest x-ray EXAM: CT CHEST WITHOUT CONTRAST TECHNIQUE: Multidetector CT imaging of the chest was performed following the standard protocol without IV contrast. COMPARISON:  07/07/2016 FINDINGS: Cardiovascular: Somewhat limited without IV contrast. Aortic calcifications are seen without aneurysmal dilatation. Coronary calcifications are seen. Mild cardiac enlargement is noted. Left jugular central line is seen in satisfactory position. Mediastinum/Nodes: Rim calcified lesion is noted in the left thyroid this measures approximately 3 cm in greatest dimension. This is stable from previous exam dating back to 2011 and consistent with a benign etiology. No definitive hilar or mediastinal adenopathy is identified. The esophagus as visualized is within normal limits. Lungs/Pleura: Lungs are well aerated bilaterally with emphysematous changes. Mild atelectatic changes are noted in the right lower lobe. In the left upper lobe there is a somewhat ovoid spiculated lesion which measures at least 3.2 by 2.9 cm. Some focal extension towards the pleura is noted. 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 Upper Abdomen: Cystic changes are noted in both kidneys which have been better evaluated on recent CT examination. Changes from the known hematoma are again seen and stable. Musculoskeletal: Degenerative changes of the thoracic spine are noted. 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 Bronchoscopic biopsy may be helpful for further evaluation. Stable left thyroid lesion likely of a benign etiology. Cystic changes in the kidneys as well as a retroperitoneal hematoma stable from recent exam. Electronically Signed   By: Inez Catalina  M.D.   On: 07/09/2016 16:19        Scheduled Meds: . amoxicillin-clavulanate  1 tablet Oral Q24H  . calcitRIOL  0.25 mcg Oral Q T,Th,Sa-HD  . calcium acetate  1,334 mg Oral TID WC  . [START ON 07/12/2016] darbepoetin (ARANESP) injection - DIALYSIS  150 mcg Intravenous Q Tue-HD  . feeding supplement (NEPRO CARB STEADY)  237 mL Oral BID BM  . ferric gluconate (FERRLECIT/NULECIT) IV  250 mg Intravenous Q T,Th,Sa-HD  . guaiFENesin  1,200 mg Oral BID  . insulin aspart  0-5 Units Subcutaneous QHS  . insulin aspart  0-9 Units Subcutaneous TID WC  . insulin glargine  8 Units Subcutaneous Daily  . levothyroxine  75 mcg Oral QAC breakfast  . mouth rinse  15 mL Mouth Rinse BID  . multivitamin  1 tablet Oral QHS   Continuous Infusions:   LOS: 5 days    Time spent: 35 minutes    THOMPSON,DANIEL, MD [facility] Pager [phone]  If 7PM-7AM, please contact night-coverage [email or web address] Password [email or web address] 07/11/2016, 1:25 PM

## 2016-07-11 NOTE — Progress Notes (Signed)
[facility] Progress Note   Subjective: "I don't hurt. I'm just tired of being here".   Objective Vitals:   07/10/16 0930 07/10/16 1742 07/10/16 2038 07/11/16 0526  BP: (!) 115/46 121/75 130/77 (!) 125/107  Pulse: 84 84 80 (!) 51  Resp: '20 20 18 16  '$ Temp: 98.4 F (36.9 C) 98.5 F (36.9 C) 98.5 F (36.9 C) 97.8 F (36.6 C)  TempSrc: Oral Oral Oral Oral  SpO2: 98% 98% 91% 99%  Weight:   74.5 kg (164 lb 3.9 oz)   Height:       Physical Exam General: Pleasant, NAD  Heart: S1,S2, No M/R/G Lungs: CTAP A/P Abdomen: soft, non-tender. Active BS Extremities: L AKA-no stump edema. No RLE edema Dialysis Access: RUA AVF + bruit  Additional Objective Labs: Basic Metabolic Panel:  Recent Labs Lab 07/09/16 0351 07/10/16 0407 07/11/16 0422  NA 134* 133* 133*  K 4.6 3.8 3.9  CL 90* 90* 91*  CO2 '27 28 28  '$ GLUCOSE 179* 180* 197*  BUN 26* 18 34*  CREATININE 6.77* 4.34* 6.41*  CALCIUM 8.2* 8.1* 8.2*  PHOS 4.0 4.2 4.3   Liver Function Tests:  Recent Labs Lab 07/07/16 0236  07/09/16 0351 07/10/16 0407 07/11/16 0422  AST 20  --   --   --   --   ALT 20  --   --   --   --   ALKPHOS 60  --   --   --   --   BILITOT 0.6  --   --   --   --   PROT 6.4*  --   --   --   --   ALBUMIN 2.9*  < > 2.6* 2.5* 2.5*  < > = values in this interval not displayed. No results for input(s): LIPASE, AMYLASE in the last 168 hours. CBC:  Recent Labs Lab 07/06/16 0742  07/07/16 2000 07/08/16 0356 07/09/16 0351 07/09/16 0924 07/11/16 0422  WBC 12.3*  < > 12.1* 11.6* 14.0* 14.3* 12.5*  NEUTROABS 10.1*  --   --  9.1*  --   --   --   HGB 9.5*  < > 7.8* 7.7* 7.1* 7.1* 6.7*  HCT 30.9*  < > 27.4* 26.2* 24.7* 24.7* 23.3*  MCV 77.4*  < > 77.8* 78.2 77.4* 76.9* 76.4*  PLT 188  < > 197 184 198 204 216  < > = values in this interval not displayed. Blood Culture    Component Value Date/Time   SDES BLOOD LEFT HAND 07/08/2016 1252   SDES BLOOD LEFT HAND 07/08/2016 1252   SPECREQUEST IN PEDIATRIC BOTTLE 4CC 07/08/2016 1252   SPECREQUEST BOTTLES DRAWN AEROBIC AND ANAEROBIC 5CC 07/08/2016 1252   CULT NO GROWTH 2 DAYS 07/08/2016 1252   CULT NO GROWTH 2 DAYS 07/08/2016 1252   REPTSTATUS PENDING 07/08/2016 1252   REPTSTATUS PENDING 07/08/2016 1252    Cardiac Enzymes: No results for input(s): CKTOTAL, CKMB, CKMBINDEX, TROPONINI in the last 168 hours. CBG:  Recent Labs Lab 07/10/16 0735 07/10/16 1124 07/10/16 1714 07/10/16 2044 07/11/16 0817  GLUCAP 184* 231* 239* 212* 213*   Iron Studies: No results for input(s): IRON, TIBC, TRANSFERRIN, FERRITIN in the last 72 hours. '@lablastinr3'$ @ Studies/Results: Ct Chest Wo Contrast  Result Date: 07/09/2016 CLINICAL DATA:  Possible mass on recent chest x-ray EXAM: CT CHEST WITHOUT CONTRAST TECHNIQUE: Multidetector CT imaging of the chest was performed following the standard protocol without IV contrast. COMPARISON:  07/07/2016 FINDINGS: Cardiovascular: Somewhat limited without IV  contrast. Aortic calcifications are seen without aneurysmal dilatation. Coronary calcifications are seen. Mild cardiac enlargement is noted. Left jugular central line is seen in satisfactory position. Mediastinum/Nodes: Rim calcified lesion is noted in the left thyroid this measures approximately 3 cm in greatest dimension. This is stable from previous exam dating back to 2011 and consistent with a benign etiology. No definitive hilar or mediastinal adenopathy is identified. The esophagus as visualized is within normal limits. Lungs/Pleura: Lungs are well aerated bilaterally with emphysematous changes. Mild atelectatic changes are noted in the right lower lobe. In the left upper lobe there is a somewhat ovoid spiculated lesion which measures at least 3.2 by 2.9 cm. Some focal extension towards the pleura is noted. 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 Upper Abdomen: Cystic changes are noted in both kidneys which  have been better evaluated on recent CT examination. Changes from the known hematoma are again seen and stable. Musculoskeletal: Degenerative changes of the thoracic spine are noted. 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 Bronchoscopic biopsy may be helpful for further evaluation. Stable left thyroid lesion likely of a benign etiology. Cystic changes in the kidneys as well as a retroperitoneal hematoma stable from recent exam. Electronically Signed   By: Inez Catalina M.D.   On: 07/09/2016 16:19   Medications:  . amoxicillin-clavulanate  1 tablet Oral Q24H  . calcitRIOL  0.25 mcg Oral Q T,Th,Sa-HD  . calcium acetate  1,334 mg Oral TID WC  . [START ON 07/12/2016] darbepoetin (ARANESP) injection - DIALYSIS  150 mcg Intravenous Q Tue-HD  . feeding supplement (NEPRO CARB STEADY)  237 mL Oral BID BM  . ferric gluconate (FERRLECIT/NULECIT) IV  250 mg Intravenous Q T,Th,Sa-HD  . guaiFENesin  1,200 mg Oral BID  . insulin aspart  0-5 Units Subcutaneous QHS  . insulin aspart  0-9 Units Subcutaneous TID WC  . levothyroxine  75 mcg Oral QAC breakfast  . mouth rinse  15 mL Mouth Rinse BID  . multivitamin  1 tablet Oral QHS     Dialysis Orders: [facility]  3h 68mn   2/2.25 bath  400/Auto 2   76kg   Hep none   RUA AVF calcitriol 0.25 Mircera 150 given 2/6 Recent labs: hgb 10.3 12% sat Jan, 15% Dec- s/o course of IV Fe for January ferritin 1365 iPTH 307 Ca/P ok  Assessment: 1.Right retroperitoneal bleed -seen by urology, Stable on CT 07/09/16.  2. 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 Per primary. Seen by PCCM (Dr. YNelda Marseille. F/U with pulmonary as OP.   3. ESRD- TTS HD 4. Hypertension/volume- BP controlled. Last  HD 07/09/16 Net UF 1 liter Post wt 76 kg. Wt today 74.5 kg. Recheck wt prior to HD tomorrow to determine UFG.  5. Anemia- hgb 6.7 today. Denies SOB/weakness. Tsat 7% expected  drop due to RPB-  she is s/p a course of  Fe due to low tsat 12%; started 1 gm Fe load here, is due two more 250 mg doses.  Last 150 Mircera given 2/6, will get darbe on Tues if still here 150ug.  Pt is JW and she refuses blood transfusion even in life-threatening situations,  6. Metabolic bone disease- Ca 8.2 C Ca 9.4 Phos 4.3 Continue binders/VDRA.  7. Nutrition-Albumin 2.5. Renal diet/vitamins/protein supp    6.  DM - per primary  Alain Deschene H. Vickie Melnik NP-C 07/11/2016, 9:17 AM  [phone]

## 2016-07-11 NOTE — Progress Notes (Signed)
CRITICAL LAB VALUE Hemoglobin: 6.7 On call NP, Schorr, notified.   Ermalinda Memos, RN

## 2016-07-11 NOTE — Progress Notes (Signed)
SATURATION QUALIFICATIONS: (This note is used to comply with regulatory documentation for home oxygen)  Patient Saturations on Room Air at Rest = 78%  Patient Saturations on Room Air while Ambulating = patient doesn't ambulate. Wheelchair bound.  Patient Saturations on 2 Liters of oxygen while at rest = 99%  Please briefly explain why patient needs home oxygen:

## 2016-07-12 LAB — CBC WITH DIFFERENTIAL/PLATELET
BASOS PCT: 0 %
Basophils Absolute: 0 10*3/uL (ref 0.0–0.1)
Eosinophils Absolute: 0.2 10*3/uL (ref 0.0–0.7)
Eosinophils Relative: 2 %
HCT: 24.3 % — ABNORMAL LOW (ref 36.0–46.0)
Hemoglobin: 6.9 g/dL — CL (ref 12.0–15.0)
LYMPHS ABS: 1.4 10*3/uL (ref 0.7–4.0)
Lymphocytes Relative: 12 %
MCH: 21.6 pg — AB (ref 26.0–34.0)
MCHC: 28.4 g/dL — AB (ref 30.0–36.0)
MCV: 76.2 fL — ABNORMAL LOW (ref 78.0–100.0)
Monocytes Absolute: 1 10*3/uL (ref 0.1–1.0)
Monocytes Relative: 9 %
NEUTROS PCT: 77 %
Neutro Abs: 8.4 10*3/uL — ABNORMAL HIGH (ref 1.7–7.7)
PLATELETS: 208 10*3/uL (ref 150–400)
RBC: 3.19 MIL/uL — AB (ref 3.87–5.11)
RDW: 19.9 % — ABNORMAL HIGH (ref 11.5–15.5)
WBC: 10.9 10*3/uL — AB (ref 4.0–10.5)

## 2016-07-12 LAB — GLUCOSE, CAPILLARY
Glucose-Capillary: 147 mg/dL — ABNORMAL HIGH (ref 65–99)
Glucose-Capillary: 92 mg/dL (ref 65–99)
Glucose-Capillary: 195 mg/dL — ABNORMAL HIGH (ref 65–99)

## 2016-07-12 LAB — RENAL FUNCTION PANEL
Albumin: 2.6 g/dL — ABNORMAL LOW (ref 3.5–5.0)
Anion gap: 19 — ABNORMAL HIGH (ref 5–15)
BUN: 48 mg/dL — ABNORMAL HIGH (ref 6–20)
CO2: 24 mmol/L (ref 22–32)
Calcium: 8.4 mg/dL — ABNORMAL LOW (ref 8.9–10.3)
Chloride: 91 mmol/L — ABNORMAL LOW (ref 101–111)
Creatinine, Ser: 8.64 mg/dL — ABNORMAL HIGH (ref 0.44–1.00)
GFR calc Af Amer: 5 mL/min — ABNORMAL LOW (ref >60)
GFR calc non Af Amer: 4 mL/min — ABNORMAL LOW (ref >60)
Glucose, Bld: 155 mg/dL — ABNORMAL HIGH (ref 65–99)
Phosphorus: 4.1 mg/dL (ref 2.5–4.6)
Potassium: 4.1 mmol/L (ref 3.5–5.1)
Sodium: 134 mmol/L — ABNORMAL LOW (ref 135–145)

## 2016-07-12 MED ORDER — SODIUM CHLORIDE 0.9 % IV SOLN: 100.0000 mL | INTRAVENOUS | Status: DC | PRN

## 2016-07-12 MED ORDER — INSULIN GLARGINE 100 UNIT/ML ~~LOC~~ SOLN: 10.0000 [IU] | Freq: Every day | SUBCUTANEOUS | 0 refills | Status: AC

## 2016-07-12 MED ORDER — HYDROCODONE-HOMATROPINE 5-1.5 MG/5ML PO SYRP: 5.0000 mL | ORAL_SOLUTION | Freq: Four times a day (QID) | ORAL | 0 refills | Status: DC | PRN

## 2016-07-12 MED ORDER — OXYCODONE-ACETAMINOPHEN 5-325 MG PO TABS: 1.0000 | ORAL_TABLET | Freq: Four times a day (QID) | ORAL | 0 refills | Status: DC | PRN

## 2016-07-12 MED ORDER — LIDOCAINE-PRILOCAINE 2.5-2.5 % EX CREA: 1.0000 "application " | TOPICAL_CREAM | CUTANEOUS | Status: DC | PRN

## 2016-07-12 MED ORDER — DARBEPOETIN ALFA 150 MCG/0.3ML IJ SOSY
PREFILLED_SYRINGE | INTRAMUSCULAR | Status: AC
  Administered 2016-07-12: 150 ug via INTRAVENOUS
  Filled 2016-07-12: qty 0.3

## 2016-07-12 MED ORDER — CALCITRIOL 0.25 MCG PO CAPS
ORAL_CAPSULE | ORAL | Status: AC
  Administered 2016-07-12: 0.25 ug via ORAL
  Filled 2016-07-12: qty 1

## 2016-07-12 MED ORDER — AMOXICILLIN-POT CLAVULANATE 500-125 MG PO TABS: 1.0000 | ORAL_TABLET | ORAL | 0 refills | Status: AC

## 2016-07-12 MED ORDER — PENTAFLUOROPROP-TETRAFLUOROETH EX AERO: 1.0000 "application " | INHALATION_SPRAY | CUTANEOUS | Status: DC | PRN

## 2016-07-12 MED ORDER — HEPARIN SODIUM (PORCINE) 1000 UNIT/ML DIALYSIS: 1000.0000 [IU] | INTRAMUSCULAR | Status: DC | PRN

## 2016-07-12 MED ORDER — GUAIFENESIN ER 600 MG PO TB12: 1200.0000 mg | ORAL_TABLET | Freq: Two times a day (BID) | ORAL | 0 refills | Status: AC

## 2016-07-12 MED ORDER — ALTEPLASE 2 MG IJ SOLR: 2.0000 mg | Freq: Once | INTRAMUSCULAR | Status: DC | PRN

## 2016-07-12 MED ORDER — LIDOCAINE HCL (PF) 1 % IJ SOLN: 5.0000 mL | INTRAMUSCULAR | Status: DC | PRN

## 2016-07-12 NOTE — Progress Notes (Addendum)
[facility] Progress Note   Subjective: Feeling better but is tired.  Objective Vitals:   07/10/16 0930 07/10/16 1742 07/10/16 2038 07/11/16 0526  BP: (!) 115/46 121/75 130/77 (!) 125/107  Pulse: 84 84 80 (!) 51  Resp: '20 20 18 16  '$ Temp: 98.4 F (36.9 C) 98.5 F (36.9 C) 98.5 F (36.9 C) 97.8 F (36.6 C)  TempSrc: Oral Oral Oral Oral  SpO2: 98% 98% 91% 99%  Weight:   74.5 kg (164 lb 3.9 oz)   Height:       Physical Exam General: Pleasant, NAD  Heart: S1,S2, No M/R/G Lungs: bilateral expiratory rhonchi Abdomen: soft, non-tender. 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 No RLE edema Dialysis Access: RUA AVF + bruit  Additional Objective Labs: Basic Metabolic Panel:  Recent Labs Lab 07/09/16 0351 07/10/16 0407 07/11/16 0422  NA 134* 133* 133*  K 4.6 3.8 3.9  CL 90* 90* 91*  CO2 '27 28 28  '$ GLUCOSE 179* 180* 197*  BUN 26* 18 34*  CREATININE 6.77* 4.34* 6.41*  CALCIUM 8.2* 8.1* 8.2*  PHOS 4.0 4.2 4.3   Liver Function Tests:  Recent Labs Lab 07/07/16 0236  07/09/16 0351 07/10/16 0407 07/11/16 0422  AST 20  --   --   --   --   ALT 20  --   --   --   --   ALKPHOS 60  --   --   --   --   BILITOT 0.6  --   --   --   --   PROT 6.4*  --   --   --   --   ALBUMIN 2.9*  < > 2.6* 2.5* 2.5*  < > = values in this interval not displayed. No results for input(s): LIPASE, AMYLASE in the last 168 hours. CBC:  Recent Labs Lab 07/06/16 0742  07/07/16 2000 07/08/16 0356 07/09/16 0351 07/09/16 0924 07/11/16 0422  WBC 12.3*  < > 12.1* 11.6* 14.0* 14.3* 12.5*  NEUTROABS 10.1*  --   --  9.1*  --   --   --   HGB 9.5*  < > 7.8* 7.7* 7.1* 7.1* 6.7*  HCT 30.9*  < > 27.4* 26.2* 24.7* 24.7* 23.3*  MCV 77.4*  < > 77.8* 78.2 77.4* 76.9* 76.4*  PLT 188  < > 197 184 198 204 216  < > = values in this interval not displayed. Blood Culture    Component Value Date/Time   SDES BLOOD LEFT HAND 07/08/2016 1252   SDES BLOOD LEFT HAND 07/08/2016 1252   SPECREQUEST IN PEDIATRIC BOTTLE 4CC 07/08/2016 1252   SPECREQUEST BOTTLES DRAWN AEROBIC AND ANAEROBIC 5CC 07/08/2016 1252   CULT NO GROWTH 2 DAYS 07/08/2016 1252   CULT NO GROWTH 2 DAYS 07/08/2016 1252   REPTSTATUS PENDING 07/08/2016 1252   REPTSTATUS PENDING 07/08/2016 1252    Cardiac Enzymes: No results for input(s): CKTOTAL, CKMB, CKMBINDEX, TROPONINI in the last 168 hours. CBG:  Recent Labs Lab 07/10/16 0735 07/10/16 1124 07/10/16 1714 07/10/16 2044 07/11/16 0817  GLUCAP 184* 231* 239* 212* 213*   Iron Studies: No results for input(s): IRON, TIBC, TRANSFERRIN, FERRITIN in the last 72 hours. '@lablastinr3'$ @ Studies/Results: Ct Chest Wo Contrast  Result Date: 07/09/2016 CLINICAL DATA:  Possible mass on recent chest x-ray EXAM: CT CHEST WITHOUT CONTRAST TECHNIQUE: Multidetector CT imaging of the chest was performed following the standard protocol without IV contrast. COMPARISON:  07/07/2016 FINDINGS: Cardiovascular: Somewhat limited without IV contrast. Aortic calcifications are  seen without aneurysmal dilatation. Coronary calcifications are seen. Mild cardiac enlargement is noted. Left jugular central line is seen in satisfactory position. Mediastinum/Nodes: Rim calcified lesion is noted in the left thyroid this measures approximately 3 cm in greatest dimension. This is stable from previous exam dating back to 2011 and consistent with a benign etiology. No definitive hilar or mediastinal adenopathy is identified. The esophagus as visualized is within normal limits. Lungs/Pleura: Lungs are well aerated bilaterally with emphysematous changes. Mild atelectatic changes are noted in the right lower lobe. In the left upper lobe there is a somewhat ovoid spiculated lesion which measures at least 3.2 by 2.9 cm. Some focal extension towards the pleura is noted. 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 Upper Abdomen: Cystic changes are noted in both kidneys which  have been better evaluated on recent CT examination. Changes from the known hematoma are again seen and stable. Musculoskeletal: Degenerative changes of the thoracic spine are noted. 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 Bronchoscopic biopsy may be helpful for further evaluation. Stable left thyroid lesion likely of a benign etiology. Cystic changes in the kidneys as well as a retroperitoneal hematoma stable from recent exam. Electronically Signed   By: Inez Catalina M.D.   On: 07/09/2016 16:19   Medications:  . amoxicillin-clavulanate  1 tablet Oral Q24H  . calcitRIOL  0.25 mcg Oral Q T,Th,Sa-HD  . calcium acetate  1,334 mg Oral TID WC  . [START ON 07/12/2016] darbepoetin (ARANESP) injection - DIALYSIS  150 mcg Intravenous Q Tue-HD  . feeding supplement (NEPRO CARB STEADY)  237 mL Oral BID BM  . ferric gluconate (FERRLECIT/NULECIT) IV  250 mg Intravenous Q T,Th,Sa-HD  . guaiFENesin  1,200 mg Oral BID  . insulin aspart  0-5 Units Subcutaneous QHS  . insulin aspart  0-9 Units Subcutaneous TID WC  . levothyroxine  75 mcg Oral QAC breakfast  . mouth rinse  15 mL Mouth Rinse BID  . multivitamin  1 tablet Oral QHS     Dialysis Orders: [facility]  3h 50mn   2/2.25 bath  400/Auto 2   76kg   Hep none   RUA AVF calcitriol 0.25 Mircera 150 given 2/6 Recent labs: hgb 10.3 12% sat Jan, 15% Dec- s/o course of IV Fe for January ferritin 1365 iPTH 307 Ca/P ok  Assessment: 1.Right retroperitoneal bleed -seen by urology, Stable on CT 07/09/16.  2. 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 Per primary. Seen by PCCM (Dr. YNelda Marseille. F/U with pulmonary as OP.   3. ESRD- TTS HD , HD on schedule today 4. Hypertension/volume- BP controlled. Under EDW today (wt 75.7); UF goal 1 L; will need EDW adjustment at discharge  5. Anemia- hgb 6.7-->6.9 today, stable. Denies SOB/weakness. Tsat 7% expected  drop due to RPB-  she is s/p a course of  Fe due to low tsat 12%; started 1 gm Fe load here, 250 mg IV x 4.  Last 150 Mircera given 2/6, Aranesp q weekly 150ug starting 2/13.  Pt is JW and she refuses blood transfusion even in life-threatening situations,  In the setting of her lung mass which is suspicious for malignancy (#2) we need to readdress the outpatient administration of ESA. 6. Metabolic bone disease- Ca 8.2 C Ca 9.4 Phos 4.3 continue calcium acetate 2 capsules AC TID and calcitriol 0.25 mcg with HD 7. Nutrition-Albumin 2.5. Renal diet/vitamins/protein supp    6.  DM - per primary   Madelon Lips MD [place] pgr [phone]

## 2016-07-12 NOTE — Progress Notes (Signed)
[name] to be D/C'd Home per MD order.  Discussed prescriptions and follow up appointments with the patient. Prescriptions given to patient, medication list explained in detail. Pt verbalized understanding.  Allergies as of 07/12/2016      Reactions   Contrast Media [iodinated Diagnostic Agents] Itching   Latex Itching      Medication List    STOP taking these medications   amLODipine 5 MG tablet Commonly known as:  NORVASC   benzonatate 100 MG capsule Commonly known as:  TESSALON   levofloxacin 500 MG tablet Commonly known as:  LEVAQUIN   oxyCODONE 5 MG immediate release tablet Commonly known as:  ROXICODONE     TAKE these medications   acetaminophen 650 MG CR tablet Commonly known as:  TYLENOL Take 1,300 mg by mouth every 8 (eight) hours as needed for pain.   amoxicillin-clavulanate 500-125 MG tablet Commonly known as:  AUGMENTIN Take 1 tablet (500 mg total) by mouth daily. Take for 3 days then stop.   calcium acetate 667 MG capsule Commonly known as:  PHOSLO Take 1 capsule (667 mg total) by mouth 2 (two) times daily with a meal.   guaiFENesin 600 MG 12 hr tablet Commonly known as:  MUCINEX Take 2 tablets (1,200 mg total) by mouth 2 (two) times daily. Take for 3 days then stop.   HYDROcodone-acetaminophen 5-325 MG tablet Commonly known as:  NORCO/VICODIN Take 1 tablet by mouth every 6 (six) hours as needed for moderate pain.   HYDROcodone-homatropine 5-1.5 MG/5ML syrup Commonly known as:  HYCODAN Take 5 mLs by mouth every 6 (six) hours as needed for cough.   insulin aspart 100 UNIT/ML injection Commonly known as:  novoLOG Inject 0-15 Units into the skin every 4 (four) hours. CBG < 70: Drink juice; CBG 70 - 120: 0 units: CBG 121 - 150: 2 units; CBG 151 - 200: 3 units; CBG 201 - 250: 5 units; CBG 251 - 300: 8 units;CBG 301 - 350: 11 units; CBG 351 - 400: 15 units; CBG > 400 : 15 units and Call MD What changed:  when to take this  reasons to take  this  additional instructions   insulin glargine 100 UNIT/ML injection Commonly known as:  LANTUS Inject 0.1 mLs (10 Units total) into the skin at bedtime. What changed:  how much to take   levothyroxine 75 MCG tablet Commonly known as:  SYNTHROID, LEVOTHROID Take 75 mcg by mouth daily.   linagliptin 5 MG Tabs tablet Commonly known as:  TRADJENTA Take 5 mg by mouth daily.   metoCLOPramide 5 MG tablet Commonly known as:  REGLAN Take 1-2 tablets (5-10 mg total) by mouth every 8 (eight) hours as needed (if ondansetron (ZOFRAN) ineffective.).   ondansetron 4 MG tablet Commonly known as:  ZOFRAN Take 1 tablet (4 mg total) by mouth every 6 (six) hours as needed for nausea.   oxyCODONE-acetaminophen 5-325 MG tablet Commonly known as:  PERCOCET/ROXICET Take 1 tablet by mouth every 6 (six) hours as needed for moderate pain or severe pain.   RENAL Tabs Take 1 tablet by mouth daily.            Durable Medical Equipment        Start     Ordered   07/12/16 1343  For home use only DME oxygen  Once    Question Answer Comment  Mode or (Route) Nasal cannula   Liters per Minute 2   Frequency Continuous (stationary and portable oxygen unit  needed)   Oxygen conserving device Yes   Oxygen delivery system Gas      07/12/16 1342      Vitals:   07/12/16 1232 07/12/16 1316  BP: 129/67 130/66  Pulse: 85 85  Resp: 16 17  Temp: 98.3 F (36.8 C) 98.5 F (36.9 C)    Skin clean, dry and intact without evidence of skin break down, no evidence of skin tears noted. IV catheter discontinued intact. Site without signs and symptoms of complications. Dressing and pressure applied. Pt denies pain at this time. No complaints noted.  An After Visit Summary was printed and given to the patient. O2 delivered to patient room. Scripts given to patient Patient to be escorted via [place], and D/C home via private auto.  Retta Mac BSN, RN

## 2016-07-12 NOTE — Progress Notes (Signed)
SATURATION QUALIFICATIONS: (This note is used to comply with regulatory documentation for home oxygen)  Patient Saturations on Room Air at Rest = 87%  Patient Saturations on 2 Liters of oxygen at rest = 95%  Please briefly explain why patient needs home oxygen: to maintain O2 sats.  Patient is wheelchair bound, non-ambulatory.  Jillyn Ledger, MBA, BSN, RN

## 2016-07-12 NOTE — Procedures (Signed)
Patient seen and examined on Hemodialysis. QB 400 UF goal 500 mL.  hgb stable.  Getting IV iron and Aranesp. Treatment adjusted as needed.  Madelon Lips MD [facility] pgr [phone] 10:42 AM

## 2016-07-12 NOTE — Discharge Summary (Signed)
Physician Discharge Summary  [name] MGQ:[id number] DOB: 1946/05/23 DOA: 07/06/2016  PCP: [name], MD  Admit date: 07/06/2016 Discharge date: 07/12/2016  Time spent: 65 minutes  Recommendations for Outpatient Follow-up:  1. Follow-up with Dr. Lamonte Sakai 08/05/2016 for further evaluation of left upper lobe spiculated mass. 2. Follow-up with Dr. Tammi Klippel in 2 weeks for further evaluation of retroperitoneal bleed and bilateral renal masses. 3. Follow-up in hemodialysis center as scheduled on Thursday, 07/14/2016. 4. Follow-up with FANTA,TESFAYE, MD 1-2 weeks. On follow-up patient will need a CBC done to follow-up on H&H.   Discharge Diagnoses:  Principal Problem:   Retroperitoneal bleed Active Problems:   Lung mass   Diabetes mellitus type II, uncontrolled (HCC)   Iron deficiency anemia   Hypertension   Peripheral vascular disease (HCC)   RENAL DISEASE, CHRONIC, STAGE V   ESRD (end stage renal disease) (HCC)   Retroperitoneal bleeding   Acute blood loss anemia   Community acquired pneumonia of left lung   Hypothyroidism   Community acquired pneumonia   Renal cyst, right   Renal hemorrhage, right   Bilateral renal masses   Discharge Condition: Stable and improved  Diet recommendation: heart healthy diet.  Filed Weights   07/11/16 2055 07/12/16 0840 07/12/16 1232  Weight: 75.5 kg (166 lb 8 oz) 75.7 kg (166 lb 14.2 oz) 74.1 kg (163 lb 5.8 oz)    History of present illness:  Per Dr. Girard Cooter is a 71 y.o. female with medical history significant of ESRD on dialysis (on Tuesday, Thursday, Saturday) who only had a partial dialysis session the day prior to admission , anemia of ESRD, HTN who presented with flank pain that woke patient from sleep. Pain 9/10 and patient felt the need to sit up.  Pain was constant and patient stated she has had emesis associated with the pain.  No fever, no chills, no chest pain or pressure, no shortness of breath.  Pain was localized  and did not radiate; patient reported pain was dull and constant.  No increase in pain with movement. CT was done in January for evaluation of her appendix.  She was found to have a kidney mass at that time and followed up with a urologist.  Stated that the urologist told her that the mass was stable and no further intervention was necessary.    ED Course: patient underwent CT renal stone study which showed Large right pararenal and retroperitoneal hematoma likely arising from rupture of a a right renal lesion, possibly hemorrhagic cyst or solid mass. A ruptured renal cell carcinoma should be excluded. Both kidneys demonstrate multiple hemorrhagic and nonhemorrhagic cysts. No hydronephrosis. Multiple tiny low-attenuation nodules throughout the liver similar to prior study. This could represent metastasis, infectious process, regenerative nodules.  Hospital Course:  #1 retroperitoneal bleed secondary to bilateral renal masses Per urology likely from the right lower pole renal mass/cyst. Per urology patient not a good surgical candidate and recommended conservative treatment at this time. Hemoglobin trended down and currently at stabilizer 6.9 from 9.5 on admission. Patient is a Jehovah witness and does not receive blood transfusions. Patient received IV iron during hemodialysis. Per urology outpatient surveillance of renal masses. Patient improved clinically. Patient's flank pain improved. Patient will follow-up with Dr. Tammi Klippel, urology as outpatient for further evaluation and management.  #2 end-stage renal disease On hemodialysis Tuesdays Thursdays Saturdays. Patient received a brief 30 minutes treatment prior to admission secondary to diarrhea. No further diarrhea. Patient was seen by nephrology during the  hospitalization received hemodialysis on her set days. Outpatient follow-up.   #3 acute blood loss anemia/anemia of chronic disease Secondary to problem #1. Patient received IV iron on  07/06/2015. Patient got IV iron during HD per nephrology. Hemoglobin trickled down and stabilized 6.9 from 9.5 on admission. Patient is a Sales promotion account executive Witness and does not receive blood transfusions. Most of patient's labs were drawn in pediatric tubes during the hospitalization. Outpatient follow-up.   #4 hypertension Blood pressure borderline and improved with discontinuation of Norvasc. Patient on hemodialysis. Patient's Norvasc will be resumed on discharge. Outpatient follow-up.  #5 cough/probable pneumonia Patient noted to have a productive cough per nursing. Chest x-ray obtained with left suprahilar pneumonia versus mass. Sputum Gram stain and culture pending at time of discharge. Blood cultures with no growth to date 4 days. Patient initially placed empirically on IV Rocephin and azithromycin and subsequently transitioned to oral Augmentin. Patient will be discharged on 3 more days of oral Augmentin to complete a course of antibiotic treatment. Outpatient follow-up.   #6 diabetes mellitus type 2 Hemoglobin A1c 6.21 07/06/2016. CBGs started to rise during the hospitalization and patient placed on Lantus 8 units daily as well as sliding scale insulin. Outpatient follow-up.  #7 hypothyroidism TSH is 0.655. Resumed home dose Synthroid.  #8 spiculated left upper lobe lung mass Per CT chest. Case was discussed with pulmonary who recommended outpatient follow-up at this time for further evaluation and possible bronchoscopy as patient with an acute illness with retroperitoneal bleeding, patient is a Jehovah's Witness and doesn't accept blood transfusions and patient currently on antibiotics. Patient was seen by Pulmonary/critical care and patient will follow-up with Dr. Lamonte Sakai of pulmonary in the outpatient setting for further evaluation and management.     Procedures:  CT stone protocol 07/06/2016.  Chest x-ray 07/07/2016  Fluoroscopy guided left jugular central line per Dr.Henn  07/07/2016   CT chest 07/09/2016  Consultations:  Nephrology: Dr.Schertz 07/06/2016  Urology: Dr. Matilde Sprang 07/06/2016  Interventional radiology: Dr. Kathlene Cote   Discharge Exam: Vitals:   07/12/16 1232 07/12/16 1316  BP: 129/67 130/66  Pulse: 85 85  Resp: 16 17  Temp: 98.3 F (36.8 C) 98.5 F (36.9 C)    General: NAD Cardiovascular: RRR Respiratory: CTAB anterior lung fields.  Discharge Instructions   Discharge Instructions    Diet - low sodium heart healthy    Complete by:  As directed    Increase activity slowly    Complete by:  As directed      Current Discharge Medication List    START taking these medications   Details  amoxicillin-clavulanate (AUGMENTIN) 500-125 MG tablet Take 1 tablet (500 mg total) by mouth daily. Take for 3 days then stop. Qty: 3 tablet, Refills: 0    guaiFENesin (MUCINEX) 600 MG 12 hr tablet Take 2 tablets (1,200 mg total) by mouth 2 (two) times daily. Take for 3 days then stop. Qty: 12 tablet, Refills: 0    HYDROcodone-homatropine (HYCODAN) 5-1.5 MG/5ML syrup Take 5 mLs by mouth every 6 (six) hours as needed for cough. Qty: 120 mL, Refills: 0    oxyCODONE-acetaminophen (PERCOCET/ROXICET) 5-325 MG tablet Take 1 tablet by mouth every 6 (six) hours as needed for moderate pain or severe pain. Qty: 20 tablet, Refills: 0      CONTINUE these medications which have CHANGED   Details  insulin glargine (LANTUS) 100 UNIT/ML injection Inject 0.1 mLs (10 Units total) into the skin at bedtime. Qty: 10 mL, Refills: 0  CONTINUE these medications which have NOT CHANGED   Details  acetaminophen (TYLENOL) 650 MG CR tablet Take 1,300 mg by mouth every 8 (eight) hours as needed for pain.    calcium acetate (PHOSLO) 667 MG capsule Take 1 capsule (667 mg total) by mouth 2 (two) times daily with a meal.    insulin aspart (NOVOLOG) 100 UNIT/ML injection Inject 0-15 Units into the skin every 4 (four) hours. CBG < 70: Drink juice; CBG 70 - 120:  0 units: CBG 121 - 150: 2 units; CBG 151 - 200: 3 units; CBG 201 - 250: 5 units; CBG 251 - 300: 8 units;CBG 301 - 350: 11 units; CBG 351 - 400: 15 units; CBG > 400 : 15 units and Call MD Qty: 1 vial    levothyroxine (SYNTHROID, LEVOTHROID) 75 MCG tablet Take 75 mcg by mouth daily.      linagliptin (TRADJENTA) 5 MG TABS tablet Take 5 mg by mouth daily.    metoCLOPramide (REGLAN) 5 MG tablet Take 1-2 tablets (5-10 mg total) by mouth every 8 (eight) hours as needed (if ondansetron (ZOFRAN) ineffective.).    Multiple Vitamins-Minerals (RENAL) TABS Take 1 tablet by mouth daily.    ondansetron (ZOFRAN) 4 MG tablet Take 1 tablet (4 mg total) by mouth every 6 (six) hours as needed for nausea. Qty: 20 tablet    HYDROcodone-acetaminophen (NORCO/VICODIN) 5-325 MG per tablet Take 1 tablet by mouth every 6 (six) hours as needed for moderate pain. Qty: 120 tablet, Refills: 0      STOP taking these medications     amLODipine (NORVASC) 5 MG tablet      benzonatate (TESSALON) 100 MG capsule      levofloxacin (LEVAQUIN) 500 MG tablet      oxyCODONE (ROXICODONE) 5 MG immediate release tablet        Allergies  Allergen Reactions  . Contrast Media [Iodinated Diagnostic Agents] Itching  . Latex Itching   Follow-up Information    Collene Gobble., MD Follow up on 08/05/2016.   Specialty:  Pulmonary Disease Why:  f/u at 330pm for evaluation of lung mass. Contact information: [address] [address] [phone]        HD Follow up on 07/14/2016.   Why:  f/u at outpatient hemodialysis center       [facility], Hubbard Robinson, MD. Schedule an appointment as soon as possible for a visit in 2 week(s).   Specialty:  Urology Contact information: [place] [address] [phone]        FANTA,TESFAYE, MD. Schedule an appointment as soon as possible for a visit in 1 week(s).   Specialty:  Internal Medicine Why:  f/u in 1-2 weeks. Contact information: [place] [address] [phone]            The results of significant diagnostics from this hospitalization (including imaging, microbiology, ancillary and laboratory) are listed below for reference.    Significant Diagnostic Studies: Dg Chest 2 View  Result Date: 07/07/2016 CLINICAL DATA:  Productive cough. EXAM: CHEST  2 VIEW COMPARISON:  08/22/2015 FINDINGS: Left suprahilar opacity with rounded appearance. Indistinct right infrahilar opacity with mild volume loss. No edema, effusion, or pneumothorax. Normal heart size and mediastinal contours. Left IJ central line with tip at the SVC level. IMPRESSION: 1. Left suprahilar pneumonia versus mass. Recommend short follow-up chest x-ray or chest CT. 2. Mild atelectasis and pleural fluid on the right. Electronically Signed   By: Neva Seat.D.  On: 07/07/2016 14:30   Ct Chest Wo Contrast  Result Date: 07/09/2016 CLINICAL DATA:  Possible mass on recent chest x-ray EXAM: CT CHEST WITHOUT CONTRAST TECHNIQUE: Multidetector CT imaging of the chest was performed following the standard protocol without IV contrast. COMPARISON:  07/07/2016 FINDINGS: Cardiovascular: Somewhat limited without IV contrast. Aortic calcifications are seen without aneurysmal dilatation. Coronary calcifications are seen. Mild cardiac enlargement is noted. Left jugular central line is seen in satisfactory position. Mediastinum/Nodes: Rim calcified lesion is noted in the left thyroid this measures approximately 3 cm in greatest dimension. This is stable from previous exam dating back to 2011 and consistent with a benign etiology. No definitive hilar or mediastinal adenopathy is identified. The esophagus as visualized is within normal limits. Lungs/Pleura: Lungs are well aerated bilaterally with emphysematous changes. Mild atelectatic changes are noted in the right lower lobe. In the left upper lobe there is a somewhat ovoid spiculated lesion which measures at least 3.2  by 2.9 cm. Some focal extension towards the pleura is noted. 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 Upper Abdomen: Cystic changes are noted in both kidneys which have been better evaluated on recent CT examination. Changes from the known hematoma are again seen and stable. Musculoskeletal: Degenerative changes of the thoracic spine are noted. 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 Bronchoscopic biopsy may be helpful for further evaluation. Stable left thyroid lesion likely of a benign etiology. Cystic changes in the kidneys as well as a retroperitoneal hematoma stable from recent exam. Electronically Signed   By: Inez Catalina M.D.   On: 07/09/2016 16:19   Ir Fluoro Guide Cv Line Left  Result Date: 07/07/2016 INDICATION: 71 year old with end-stage renal disease and retroperitoneal bleeding. Patient has poor venous access and needs additional venous access. Plan for central line placement. EXAM: FLUOROSCOPIC AND ULTRASOUND GUIDED PLACEMENT OF A NON TUNNELED CENTRAL LINE Physician: Stephan Minister. Henn, MD MEDICATIONS: None ANESTHESIA/SEDATION: None FLUOROSCOPY TIME:  Fluoroscopy Time: 1 minutes and 30 seconds. 3.9 mGy COMPLICATIONS: None immediate. PROCEDURE: The procedure was explained to the patient. The risks and benefits of the procedure were discussed and the patient's questions were addressed. Informed consent was obtained from the patient. Left internal jugular vein was noted to be patent by ultrasound. Left side of the neck was prepped and draped in a sterile fashion. Maximal barrier sterile technique was utilized including caps, mask, sterile gowns, sterile gloves, sterile drape, hand hygiene and skin antiseptic. Skin was anesthetized with 1% lidocaine and a small incision was made. Using ultrasound guidance, 21 gauge needle directed into the left internal jugular vein. Wire was advanced centrally. Peel-away sheath was  placed. A dual lumen Power PICC line was cut to 19 cm. Catheter was advanced through the peel-away sheath and positioned at the superior cavoatrial junction. Catheter was sutured to the skin. Fluoroscopic and ultrasound images were taken and saved for documentation. FINDINGS: Catheter tip at the superior cavoatrial junction. IMPRESSION: Successful placement of a non tunneled central line with ultrasound and fluoroscopic guidance. Electronically Signed   By: Markus Daft M.D.   On: 07/07/2016 10:40   Ir US Guide Vasc Access Left  Result Date: 07/07/2016 INDICATION: 71 year old with end-stage renal disease and retroperitoneal bleeding. Patient has poor venous access and needs additional venous access. Plan for central line placement. EXAM: FLUOROSCOPIC AND ULTRASOUND GUIDED PLACEMENT OF A NON TUNNELED CENTRAL LINE Physician: Stephan Minister. Anselm Pancoast, MD MEDICATIONS: None ANESTHESIA/SEDATION: None FLUOROSCOPY TIME:  Fluoroscopy Time: 1 minutes and 30 seconds. 3.9 mGy COMPLICATIONS: None immediate. PROCEDURE: The procedure was explained to the patient. The risks and benefits of the procedure were discussed and the patient's questions were addressed. Informed consent was obtained from the patient. Left internal jugular vein was noted to be patent by ultrasound. Left side of the neck was prepped and draped in a sterile fashion. Maximal barrier sterile technique was utilized including caps, mask, sterile gowns, sterile gloves, sterile drape, hand hygiene and skin antiseptic. Skin was anesthetized with 1% lidocaine and a small incision was made. Using ultrasound guidance, 21 gauge needle directed into the left internal jugular vein. Wire was advanced centrally. Peel-away sheath was placed. A dual lumen Power PICC line was cut to 19 cm. Catheter was advanced through the peel-away sheath and positioned at the superior cavoatrial junction. Catheter was sutured to the skin. Fluoroscopic and ultrasound images were taken and saved for  documentation. FINDINGS: Catheter tip at the superior cavoatrial junction. IMPRESSION: Successful placement of a non tunneled central line with ultrasound and fluoroscopic guidance. Electronically Signed   By: Markus Daft M.D.   On: 07/07/2016 10:40   Ct Renal Stone Study  Result Date: 07/06/2016 CLINICAL DATA:  Right flank pain starting tonight. Vomiting. Dialysis patient. EXAM: CT ABDOMEN AND PELVIS WITHOUT CONTRAST TECHNIQUE: Multidetector CT imaging of the abdomen and pelvis was performed following the standard protocol without IV contrast. COMPARISON:  06/27/2016 FINDINGS: Lower chest: Calcified granuloma in the right hilum. Atelectasis in the lung bases. Coronary artery calcifications. Hepatobiliary: Multiple low-attenuation nodular parenchymal lesions demonstrated throughout the liver. This is nonspecific in could represent metastasis, infectious process, or regenerative nodules. Similar appearance to previous study. Gallbladder is surgically absent. Mild bile duct dilatation is normal for postoperative physiology. Pancreas: Unremarkable. No pancreatic ductal dilatation or surrounding inflammatory changes. Spleen: Normal in size without focal abnormality. Adrenals/Urinary Tract: No adrenal gland nodules. Multiple bilateral renal lesions most consistent with cysts and hemorrhagic cysts. Bilateral renal atrophy. No hydronephrosis or hydroureter. Since the previous study, the right kidney has enlarged and has rotated to a horizontal position. There is a large hematoma that appears to be arising from the lower pole of the right kidney, extending into the anterior pararenal space and retroperitoneum. Hemorrhage extends along the right pericolic gutter. This would likely arise from rupture of a renal lesion, possibly a cyst or solid mass. Maximal measurement of the hematoma on the axial plane is 5.2 x 13 cm. Bladder wall is not thickened. Stomach/Bowel: Stomach is within normal limits. Appendix appears normal. No  evidence of bowel wall thickening, distention, or inflammatory changes. Vascular/Lymphatic: Aortic calcification. A ectatic aorta without discrete aneurysm. Extensive calcification of iliac, external iliac, and common femoral arteries as well as internal iliac arteries. No significant lymphadenopathy. Reproductive: Calcifications in the uterus consistent with fibroids. No abnormal adnexal masses. Other: No free air in the abdomen. Abdominal wall musculature appears intact. Musculoskeletal: Degenerative changes in the spine. Postoperative change in the left proximal femur. No destructive bone lesions. IMPRESSION: Large right pararenal and retroperitoneal hematoma likely arising from rupture of a a right renal lesion, possibly hemorrhagic cyst or solid mass. A ruptured renal cell carcinoma should be excluded. Both kidneys demonstrate multiple hemorrhagic and nonhemorrhagic cysts. No hydronephrosis. Multiple tiny low-attenuation nodules throughout the liver similar to prior study. This could represent metastasis, infectious process, regenerative nodules. These results were called by telephone at the time of interpretation on 07/06/2016 at 5:53 am to Dr. Rolland Porter , who verbally  acknowledged these results. Electronically Signed   By: Lucienne Capers M.D.   On: 07/06/2016 06:01    Microbiology: Recent Results (from the past 240 hour(s))  MRSA PCR Screening     Status: None   Collection Time: 07/07/16  8:21 AM  Result Value Ref Range Status   MRSA by PCR NEGATIVE NEGATIVE Final    Comment:        The GeneXpert MRSA Assay (FDA approved for NASAL specimens only), is one component of a comprehensive MRSA colonization surveillance program. It is not intended to diagnose MRSA infection nor to guide or monitor treatment for MRSA infections.   Culture, blood (Routine X 2) w Reflex to ID Panel     Status: None (Preliminary result)   Collection Time: 07/08/16 12:52 PM  Result Value Ref Range Status   Specimen  Description BLOOD LEFT HAND  Final   Special Requests IN PEDIATRIC BOTTLE 4CC  Final   Culture NO GROWTH 4 DAYS  Final   Report Status PENDING  Incomplete  Culture, blood (Routine X 2) w Reflex to ID Panel     Status: None (Preliminary result)   Collection Time: 07/08/16 12:52 PM  Result Value Ref Range Status   Specimen Description BLOOD LEFT HAND  Final   Special Requests BOTTLES DRAWN AEROBIC AND ANAEROBIC 5CC  Final   Culture NO GROWTH 4 DAYS  Final   Report Status PENDING  Incomplete     Labs: Basic Metabolic Panel:  Recent Labs Lab 07/08/16 0356 07/09/16 0351 07/10/16 0407 07/11/16 0422 07/12/16 0855  NA 132* 134* 133* 133* 134*  K 4.1 4.6 3.8 3.9 4.1  CL 91* 90* 90* 91* 91*  CO2 '26 27 28 28 24  '$ GLUCOSE 222* 179* 180* 197* 155*  BUN 14 26* 18 34* 48*  CREATININE 4.51* 6.77* 4.34* 6.41* 8.64*  CALCIUM 7.9* 8.2* 8.1* 8.2* 8.4*  PHOS 3.0 4.0 4.2 4.3 4.1   Liver Function Tests:  Recent Labs Lab 07/07/16 0236  07/08/16 0356 07/09/16 0351 07/10/16 0407 07/11/16 0422 07/12/16 0855  AST 20  --   --   --   --   --   --   ALT 20  --   --   --   --   --   --   ALKPHOS 60  --   --   --   --   --   --   BILITOT 0.6  --   --   --   --   --   --   PROT 6.4*  --   --   --   --   --   --   ALBUMIN 2.9*  < > 2.9* 2.6* 2.5* 2.5* 2.6*  < > = values in this interval not displayed. No results for input(s): LIPASE, AMYLASE in the last 168 hours. No results for input(s): AMMONIA in the last 168 hours. CBC:  Recent Labs Lab 07/06/16 0742  07/08/16 0356 07/09/16 0351 07/09/16 0924 07/11/16 0422 07/12/16 0854  WBC 12.3*  < > 11.6* 14.0* 14.3* 12.5* 10.9*  NEUTROABS 10.1*  --  9.1*  --   --   --  8.4*  HGB 9.5*  < > 7.7* 7.1* 7.1* 6.7* 6.9*  HCT 30.9*  < > 26.2* 24.7* 24.7* 23.3* 24.3*  MCV 77.4*  < > 78.2 77.4* 76.9* 76.4* 76.2*  PLT 188  < > 184 198 204 216 208  < > = values in this interval not displayed.  Cardiac Enzymes: No results for input(s): CKTOTAL, CKMB,  CKMBINDEX, TROPONINI in the last 168 hours. BNP: BNP (last 3 results) No results for input(s): BNP in the last 8760 hours.  ProBNP (last 3 results) No results for input(s): PROBNP in the last 8760 hours.  CBG:  Recent Labs Lab 07/11/16 1242 07/11/16 1735 07/11/16 2101 07/12/16 0748 07/12/16 1300  GLUCAP 192* 186* 167* 147* 92       Signed:  Antonya Leeder MD.  [facility] 07/12/2016, 4:53 PM

## 2016-07-12 NOTE — Progress Notes (Signed)
OT Cancelation   07/12/16 1100  OT Visit Information  Last OT Received On 07/12/16  Reason Eval/Treat Not Completed Patient at procedure or test/ unavailable (HD)   [facility], OTR/L [phone]

## 2016-07-13 ENCOUNTER — Other Ambulatory Visit: Payer: Self-pay | Admitting: *Deleted

## 2016-07-13 DIAGNOSIS — Z89512 Acquired absence of left leg below knee: Secondary | ICD-10-CM | POA: Diagnosis not present

## 2016-07-13 DIAGNOSIS — Z87891 Personal history of nicotine dependence: Secondary | ICD-10-CM | POA: Diagnosis not present

## 2016-07-13 DIAGNOSIS — J189 Pneumonia, unspecified organism: Secondary | ICD-10-CM | POA: Diagnosis not present

## 2016-07-13 DIAGNOSIS — D509 Iron deficiency anemia, unspecified: Secondary | ICD-10-CM | POA: Diagnosis not present

## 2016-07-13 DIAGNOSIS — E1122 Type 2 diabetes mellitus with diabetic chronic kidney disease: Secondary | ICD-10-CM | POA: Diagnosis not present

## 2016-07-13 DIAGNOSIS — J440 Chronic obstructive pulmonary disease with acute lower respiratory infection: Secondary | ICD-10-CM | POA: Diagnosis not present

## 2016-07-13 DIAGNOSIS — I739 Peripheral vascular disease, unspecified: Secondary | ICD-10-CM | POA: Diagnosis not present

## 2016-07-13 DIAGNOSIS — K661 Hemoperitoneum: Secondary | ICD-10-CM | POA: Diagnosis not present

## 2016-07-13 DIAGNOSIS — D381 Neoplasm of uncertain behavior of trachea, bronchus and lung: Secondary | ICD-10-CM | POA: Diagnosis not present

## 2016-07-13 DIAGNOSIS — E1165 Type 2 diabetes mellitus with hyperglycemia: Secondary | ICD-10-CM | POA: Diagnosis not present

## 2016-07-13 DIAGNOSIS — I120 Hypertensive chronic kidney disease with stage 5 chronic kidney disease or end stage renal disease: Secondary | ICD-10-CM | POA: Diagnosis not present

## 2016-07-13 DIAGNOSIS — Z9981 Dependence on supplemental oxygen: Secondary | ICD-10-CM | POA: Diagnosis not present

## 2016-07-13 DIAGNOSIS — N2889 Other specified disorders of kidney and ureter: Secondary | ICD-10-CM | POA: Diagnosis not present

## 2016-07-13 DIAGNOSIS — D631 Anemia in chronic kidney disease: Secondary | ICD-10-CM | POA: Diagnosis not present

## 2016-07-13 DIAGNOSIS — Z794 Long term (current) use of insulin: Secondary | ICD-10-CM | POA: Diagnosis not present

## 2016-07-13 DIAGNOSIS — N186 End stage renal disease: Secondary | ICD-10-CM | POA: Diagnosis not present

## 2016-07-13 DIAGNOSIS — Z8673 Personal history of transient ischemic attack (TIA), and cerebral infarction without residual deficits: Secondary | ICD-10-CM | POA: Diagnosis not present

## 2016-07-13 DIAGNOSIS — Z992 Dependence on renal dialysis: Secondary | ICD-10-CM | POA: Diagnosis not present

## 2016-07-13 LAB — CULTURE, BLOOD (ROUTINE X 2)
CULTURE: NO GROWTH
Culture: NO GROWTH

## 2016-07-13 NOTE — Patient Outreach (Addendum)
[name]) Care Management  07/13/2016  ALLEN BASISTA 03-Sep-1945 [id number]  Referral from [place] following hospital discharge.  Per history patient discharged from [facility] 07/12/2016 with diagnosis-retroperitoneal bleed & bilateral masses. Other active problems include Community acquired pneumonia, lung mass, DM 2-uncontrolled, iron deficiency anemia, ESRD-on hemodialysis (Tues, Thurs, & sat), HTN, PVD, hypothyroidism.  Telephone call to patient who was advised of reason for call & [facility] care management services. HIPPA verification received from patient.   Patient verifies that she was recently discharged from hospital 2/13 and is currently home. States she in not in pain today & is coughing up white mucus today.  States she has no fever & is using oxygen therapy all the time.  States [facility] will start today.   States she has pneumonia and growth on her kidneys. States son & grandson are helping her. States grandson is currently at school and son has gone grocery shopping for her.   Voices that she has not checked her blood sugar or eaten yet. States she is not completely clear on changes with her insulin based on blood sugars. Discharge instruction on sliding scale reviewed with patient.  Patient is not voicing that she has full understanding of how sliding scale works and importance of checking blood sugar more than one time daily.   Patient was able to identify signs of low blood sugar & voices she will drink orange juice if that happens.   Patient displaying short attention span & not actively listening. States does not know where discharge instructions currently are but she did get them when discharged. Patient states she does administer her own insulin & plans to give her evening dose. Patient states she will give 8 units tonight. RN CM advised patient that discharge instructions advised that dose had been changed to 10 units in the PM.  Patient voices understanding. Patient states she is not following low salt diet and has no problem with blood pressure. (Notation in hospital record states uncontrolled HTN). Unable to complete  medication review; patient does not have all of medications because prescription is being filled & does not have list available to review at this time.   Patient states she will get dialysis on tomorrow Thurs. but will be home after 11:00 am.  Patient consents to [place] care coordinator services.   Recommendations for community care coordinator for complex case management following hospital stay requiring face to face assessments in home setting.    Refer to care management to assign [facility] care coordinator.  Telephonic signing off.   Sherrin Daisy, RN BSN [place] Management Coordinator [facility] Care Management  [phone]

## 2016-07-14 DIAGNOSIS — E1165 Type 2 diabetes mellitus with hyperglycemia: Secondary | ICD-10-CM | POA: Diagnosis not present

## 2016-07-14 DIAGNOSIS — D381 Neoplasm of uncertain behavior of trachea, bronchus and lung: Secondary | ICD-10-CM | POA: Diagnosis not present

## 2016-07-14 DIAGNOSIS — N2581 Secondary hyperparathyroidism of renal origin: Secondary | ICD-10-CM | POA: Diagnosis not present

## 2016-07-14 DIAGNOSIS — E1129 Type 2 diabetes mellitus with other diabetic kidney complication: Secondary | ICD-10-CM | POA: Diagnosis not present

## 2016-07-14 DIAGNOSIS — D509 Iron deficiency anemia, unspecified: Secondary | ICD-10-CM | POA: Diagnosis not present

## 2016-07-14 DIAGNOSIS — J440 Chronic obstructive pulmonary disease with acute lower respiratory infection: Secondary | ICD-10-CM | POA: Diagnosis not present

## 2016-07-14 DIAGNOSIS — D631 Anemia in chronic kidney disease: Secondary | ICD-10-CM | POA: Diagnosis not present

## 2016-07-14 DIAGNOSIS — K661 Hemoperitoneum: Secondary | ICD-10-CM | POA: Diagnosis not present

## 2016-07-14 DIAGNOSIS — N186 End stage renal disease: Secondary | ICD-10-CM | POA: Diagnosis not present

## 2016-07-14 DIAGNOSIS — J189 Pneumonia, unspecified organism: Secondary | ICD-10-CM | POA: Diagnosis not present

## 2016-07-15 DIAGNOSIS — J189 Pneumonia, unspecified organism: Secondary | ICD-10-CM | POA: Diagnosis not present

## 2016-07-15 DIAGNOSIS — D509 Iron deficiency anemia, unspecified: Secondary | ICD-10-CM | POA: Diagnosis not present

## 2016-07-15 DIAGNOSIS — K661 Hemoperitoneum: Secondary | ICD-10-CM | POA: Diagnosis not present

## 2016-07-15 DIAGNOSIS — D381 Neoplasm of uncertain behavior of trachea, bronchus and lung: Secondary | ICD-10-CM | POA: Diagnosis not present

## 2016-07-15 DIAGNOSIS — E1165 Type 2 diabetes mellitus with hyperglycemia: Secondary | ICD-10-CM | POA: Diagnosis not present

## 2016-07-15 DIAGNOSIS — J440 Chronic obstructive pulmonary disease with acute lower respiratory infection: Secondary | ICD-10-CM | POA: Diagnosis not present

## 2016-07-16 DIAGNOSIS — K661 Hemoperitoneum: Secondary | ICD-10-CM | POA: Diagnosis not present

## 2016-07-16 DIAGNOSIS — D381 Neoplasm of uncertain behavior of trachea, bronchus and lung: Secondary | ICD-10-CM | POA: Diagnosis not present

## 2016-07-16 DIAGNOSIS — D509 Iron deficiency anemia, unspecified: Secondary | ICD-10-CM | POA: Diagnosis not present

## 2016-07-16 DIAGNOSIS — E1165 Type 2 diabetes mellitus with hyperglycemia: Secondary | ICD-10-CM | POA: Diagnosis not present

## 2016-07-16 DIAGNOSIS — J189 Pneumonia, unspecified organism: Secondary | ICD-10-CM | POA: Diagnosis not present

## 2016-07-16 DIAGNOSIS — E1129 Type 2 diabetes mellitus with other diabetic kidney complication: Secondary | ICD-10-CM | POA: Diagnosis not present

## 2016-07-16 DIAGNOSIS — N186 End stage renal disease: Secondary | ICD-10-CM | POA: Diagnosis not present

## 2016-07-16 DIAGNOSIS — D631 Anemia in chronic kidney disease: Secondary | ICD-10-CM | POA: Diagnosis not present

## 2016-07-16 DIAGNOSIS — J440 Chronic obstructive pulmonary disease with acute lower respiratory infection: Secondary | ICD-10-CM | POA: Diagnosis not present

## 2016-07-16 DIAGNOSIS — N2581 Secondary hyperparathyroidism of renal origin: Secondary | ICD-10-CM | POA: Diagnosis not present

## 2016-07-18 ENCOUNTER — Other Ambulatory Visit: Payer: Self-pay | Admitting: *Deleted

## 2016-07-18 NOTE — Patient Outreach (Signed)
[place] [facility]) Care Management  07/18/2016  [name] 02-11-1946 [id number]  [name] is a 71 year old female patient with medical history  significant for ESRD (dialysis on Tuesday, Thursday, Saturday), anemia of ESRD, previously known kidney mass, lung mass, diabetes mellitus type II, uncontrolled, peripheral vascular disease, and hypertension.   On 07/06/16, [name] presented to the ED with flank pain and was found to have a retroperitoneal bleed secondary to bilateral renal masses. [name] was admitted and received treatment for the bleed, pneumonia, anemia, and ESRD and discharged to home on 07/12/16.   Upon discharge, [name] was referred to [place] [facility] by the primary care navigator. Our telephonic team outreached to [name] who agreed to community case management services and was referred to me.   I spoke with [name] by phone today. She recalled having spoken with my colleague Sherrin Daisy RN, BSN last week.  [name] recalled that one of her main problems while hospitalized was pneumonia. She reported that she is checking her blood sugars twice daily and they have been in the 159-184 range. She says she is taking insulin in the evening and morning only (noted in medication list that patient is on q4h sliding scale). [name] says she has a poor appetite. She denies signs/symptoms of hypoglycemia. She has not seen her primary care provider and has had difficulty getting an appointment. She also reports that her blood pressure medication was discontinued in the hospital.   Plan: I will reach out to Dr. Josephine Cables office to help [name] obtain a post hospital follow up appointment.   I will return a call to [name] and depending on her appointment with Dr. Legrand Rams, I will schedule a home visit with her as she agreed to visits in the community.    [place] Management  [phone]

## 2016-07-19 DIAGNOSIS — N186 End stage renal disease: Secondary | ICD-10-CM | POA: Diagnosis not present

## 2016-07-19 DIAGNOSIS — D509 Iron deficiency anemia, unspecified: Secondary | ICD-10-CM | POA: Diagnosis not present

## 2016-07-19 DIAGNOSIS — E1129 Type 2 diabetes mellitus with other diabetic kidney complication: Secondary | ICD-10-CM | POA: Diagnosis not present

## 2016-07-19 DIAGNOSIS — N2581 Secondary hyperparathyroidism of renal origin: Secondary | ICD-10-CM | POA: Diagnosis not present

## 2016-07-19 DIAGNOSIS — D631 Anemia in chronic kidney disease: Secondary | ICD-10-CM | POA: Diagnosis not present

## 2016-07-20 DIAGNOSIS — J440 Chronic obstructive pulmonary disease with acute lower respiratory infection: Secondary | ICD-10-CM | POA: Diagnosis not present

## 2016-07-20 DIAGNOSIS — J189 Pneumonia, unspecified organism: Secondary | ICD-10-CM | POA: Diagnosis not present

## 2016-07-20 DIAGNOSIS — E1165 Type 2 diabetes mellitus with hyperglycemia: Secondary | ICD-10-CM | POA: Diagnosis not present

## 2016-07-20 DIAGNOSIS — D509 Iron deficiency anemia, unspecified: Secondary | ICD-10-CM | POA: Diagnosis not present

## 2016-07-20 DIAGNOSIS — D381 Neoplasm of uncertain behavior of trachea, bronchus and lung: Secondary | ICD-10-CM | POA: Diagnosis not present

## 2016-07-20 DIAGNOSIS — K661 Hemoperitoneum: Secondary | ICD-10-CM | POA: Diagnosis not present

## 2016-07-21 DIAGNOSIS — D509 Iron deficiency anemia, unspecified: Secondary | ICD-10-CM | POA: Diagnosis not present

## 2016-07-21 DIAGNOSIS — E1129 Type 2 diabetes mellitus with other diabetic kidney complication: Secondary | ICD-10-CM | POA: Diagnosis not present

## 2016-07-21 DIAGNOSIS — D631 Anemia in chronic kidney disease: Secondary | ICD-10-CM | POA: Diagnosis not present

## 2016-07-21 DIAGNOSIS — N2581 Secondary hyperparathyroidism of renal origin: Secondary | ICD-10-CM | POA: Diagnosis not present

## 2016-07-21 DIAGNOSIS — N186 End stage renal disease: Secondary | ICD-10-CM | POA: Diagnosis not present

## 2016-07-22 DIAGNOSIS — J440 Chronic obstructive pulmonary disease with acute lower respiratory infection: Secondary | ICD-10-CM | POA: Diagnosis not present

## 2016-07-22 DIAGNOSIS — D381 Neoplasm of uncertain behavior of trachea, bronchus and lung: Secondary | ICD-10-CM | POA: Diagnosis not present

## 2016-07-22 DIAGNOSIS — D509 Iron deficiency anemia, unspecified: Secondary | ICD-10-CM | POA: Diagnosis not present

## 2016-07-22 DIAGNOSIS — E1165 Type 2 diabetes mellitus with hyperglycemia: Secondary | ICD-10-CM | POA: Diagnosis not present

## 2016-07-22 DIAGNOSIS — K661 Hemoperitoneum: Secondary | ICD-10-CM | POA: Diagnosis not present

## 2016-07-22 DIAGNOSIS — N186 End stage renal disease: Secondary | ICD-10-CM | POA: Diagnosis not present

## 2016-07-22 DIAGNOSIS — D649 Anemia, unspecified: Secondary | ICD-10-CM | POA: Diagnosis not present

## 2016-07-22 DIAGNOSIS — J189 Pneumonia, unspecified organism: Secondary | ICD-10-CM | POA: Diagnosis not present

## 2016-07-22 DIAGNOSIS — R58 Hemorrhage, not elsewhere classified: Secondary | ICD-10-CM | POA: Diagnosis not present

## 2016-07-22 DIAGNOSIS — E1140 Type 2 diabetes mellitus with diabetic neuropathy, unspecified: Secondary | ICD-10-CM | POA: Diagnosis not present

## 2016-07-23 DIAGNOSIS — N2581 Secondary hyperparathyroidism of renal origin: Secondary | ICD-10-CM | POA: Diagnosis not present

## 2016-07-23 DIAGNOSIS — D631 Anemia in chronic kidney disease: Secondary | ICD-10-CM | POA: Diagnosis not present

## 2016-07-23 DIAGNOSIS — D509 Iron deficiency anemia, unspecified: Secondary | ICD-10-CM | POA: Diagnosis not present

## 2016-07-23 DIAGNOSIS — N186 End stage renal disease: Secondary | ICD-10-CM | POA: Diagnosis not present

## 2016-07-23 DIAGNOSIS — E1129 Type 2 diabetes mellitus with other diabetic kidney complication: Secondary | ICD-10-CM | POA: Diagnosis not present

## 2016-07-25 DIAGNOSIS — J440 Chronic obstructive pulmonary disease with acute lower respiratory infection: Secondary | ICD-10-CM | POA: Diagnosis not present

## 2016-07-25 DIAGNOSIS — K661 Hemoperitoneum: Secondary | ICD-10-CM | POA: Diagnosis not present

## 2016-07-25 DIAGNOSIS — D381 Neoplasm of uncertain behavior of trachea, bronchus and lung: Secondary | ICD-10-CM | POA: Diagnosis not present

## 2016-07-25 DIAGNOSIS — D509 Iron deficiency anemia, unspecified: Secondary | ICD-10-CM | POA: Diagnosis not present

## 2016-07-25 DIAGNOSIS — E1165 Type 2 diabetes mellitus with hyperglycemia: Secondary | ICD-10-CM | POA: Diagnosis not present

## 2016-07-25 DIAGNOSIS — J189 Pneumonia, unspecified organism: Secondary | ICD-10-CM | POA: Diagnosis not present

## 2016-07-26 DIAGNOSIS — D509 Iron deficiency anemia, unspecified: Secondary | ICD-10-CM | POA: Diagnosis not present

## 2016-07-26 DIAGNOSIS — N2581 Secondary hyperparathyroidism of renal origin: Secondary | ICD-10-CM | POA: Diagnosis not present

## 2016-07-26 DIAGNOSIS — E1129 Type 2 diabetes mellitus with other diabetic kidney complication: Secondary | ICD-10-CM | POA: Diagnosis not present

## 2016-07-26 DIAGNOSIS — D631 Anemia in chronic kidney disease: Secondary | ICD-10-CM | POA: Diagnosis not present

## 2016-07-26 DIAGNOSIS — N186 End stage renal disease: Secondary | ICD-10-CM | POA: Diagnosis not present

## 2016-07-27 DIAGNOSIS — Z992 Dependence on renal dialysis: Secondary | ICD-10-CM | POA: Diagnosis not present

## 2016-07-27 DIAGNOSIS — D381 Neoplasm of uncertain behavior of trachea, bronchus and lung: Secondary | ICD-10-CM | POA: Diagnosis not present

## 2016-07-27 DIAGNOSIS — K661 Hemoperitoneum: Secondary | ICD-10-CM | POA: Diagnosis not present

## 2016-07-27 DIAGNOSIS — E1165 Type 2 diabetes mellitus with hyperglycemia: Secondary | ICD-10-CM | POA: Diagnosis not present

## 2016-07-27 DIAGNOSIS — N186 End stage renal disease: Secondary | ICD-10-CM | POA: Diagnosis not present

## 2016-07-27 DIAGNOSIS — J440 Chronic obstructive pulmonary disease with acute lower respiratory infection: Secondary | ICD-10-CM | POA: Diagnosis not present

## 2016-07-27 DIAGNOSIS — E1129 Type 2 diabetes mellitus with other diabetic kidney complication: Secondary | ICD-10-CM | POA: Diagnosis not present

## 2016-07-27 DIAGNOSIS — J189 Pneumonia, unspecified organism: Secondary | ICD-10-CM | POA: Diagnosis not present

## 2016-07-27 DIAGNOSIS — D509 Iron deficiency anemia, unspecified: Secondary | ICD-10-CM | POA: Diagnosis not present

## 2016-07-28 DIAGNOSIS — D381 Neoplasm of uncertain behavior of trachea, bronchus and lung: Secondary | ICD-10-CM | POA: Diagnosis not present

## 2016-07-28 DIAGNOSIS — E1129 Type 2 diabetes mellitus with other diabetic kidney complication: Secondary | ICD-10-CM | POA: Diagnosis not present

## 2016-07-28 DIAGNOSIS — N186 End stage renal disease: Secondary | ICD-10-CM | POA: Diagnosis not present

## 2016-07-28 DIAGNOSIS — J440 Chronic obstructive pulmonary disease with acute lower respiratory infection: Secondary | ICD-10-CM | POA: Diagnosis not present

## 2016-07-28 DIAGNOSIS — K661 Hemoperitoneum: Secondary | ICD-10-CM | POA: Diagnosis not present

## 2016-07-28 DIAGNOSIS — D509 Iron deficiency anemia, unspecified: Secondary | ICD-10-CM | POA: Diagnosis not present

## 2016-07-28 DIAGNOSIS — J189 Pneumonia, unspecified organism: Secondary | ICD-10-CM | POA: Diagnosis not present

## 2016-07-28 DIAGNOSIS — N2581 Secondary hyperparathyroidism of renal origin: Secondary | ICD-10-CM | POA: Diagnosis not present

## 2016-07-28 DIAGNOSIS — E1165 Type 2 diabetes mellitus with hyperglycemia: Secondary | ICD-10-CM | POA: Diagnosis not present

## 2016-07-29 DIAGNOSIS — D509 Iron deficiency anemia, unspecified: Secondary | ICD-10-CM | POA: Diagnosis not present

## 2016-07-29 DIAGNOSIS — D381 Neoplasm of uncertain behavior of trachea, bronchus and lung: Secondary | ICD-10-CM | POA: Diagnosis not present

## 2016-07-29 DIAGNOSIS — J189 Pneumonia, unspecified organism: Secondary | ICD-10-CM | POA: Diagnosis not present

## 2016-07-29 DIAGNOSIS — E1165 Type 2 diabetes mellitus with hyperglycemia: Secondary | ICD-10-CM | POA: Diagnosis not present

## 2016-07-29 DIAGNOSIS — K661 Hemoperitoneum: Secondary | ICD-10-CM | POA: Diagnosis not present

## 2016-07-29 DIAGNOSIS — J440 Chronic obstructive pulmonary disease with acute lower respiratory infection: Secondary | ICD-10-CM | POA: Diagnosis not present

## 2016-07-30 DIAGNOSIS — D509 Iron deficiency anemia, unspecified: Secondary | ICD-10-CM | POA: Diagnosis not present

## 2016-07-30 DIAGNOSIS — E1129 Type 2 diabetes mellitus with other diabetic kidney complication: Secondary | ICD-10-CM | POA: Diagnosis not present

## 2016-07-30 DIAGNOSIS — N2581 Secondary hyperparathyroidism of renal origin: Secondary | ICD-10-CM | POA: Diagnosis not present

## 2016-07-30 DIAGNOSIS — N186 End stage renal disease: Secondary | ICD-10-CM | POA: Diagnosis not present

## 2016-08-01 ENCOUNTER — Other Ambulatory Visit: Payer: Self-pay | Admitting: *Deleted

## 2016-08-01 DIAGNOSIS — D381 Neoplasm of uncertain behavior of trachea, bronchus and lung: Secondary | ICD-10-CM | POA: Diagnosis not present

## 2016-08-01 DIAGNOSIS — J440 Chronic obstructive pulmonary disease with acute lower respiratory infection: Secondary | ICD-10-CM | POA: Diagnosis not present

## 2016-08-01 DIAGNOSIS — E1165 Type 2 diabetes mellitus with hyperglycemia: Secondary | ICD-10-CM | POA: Diagnosis not present

## 2016-08-01 DIAGNOSIS — K661 Hemoperitoneum: Secondary | ICD-10-CM | POA: Diagnosis not present

## 2016-08-01 DIAGNOSIS — J189 Pneumonia, unspecified organism: Secondary | ICD-10-CM | POA: Diagnosis not present

## 2016-08-01 DIAGNOSIS — D509 Iron deficiency anemia, unspecified: Secondary | ICD-10-CM | POA: Diagnosis not present

## 2016-08-01 NOTE — Patient Outreach (Signed)
[facility]) Care Management  08/01/2016  [name] April 11, 1946 [id number]  [name] is a 71 year old female patient with medical history significant for ESRD (dialysis on Tuesday, Thursday, Saturday), anemia of ESRD, previously known kidney mass, lung mass, diabetes mellitus type II, uncontrolled, peripheral vascular disease, and hypertension.   On 07/06/16, [name] presented to the ED with flank pain and was found to have a retroperitoneal bleed secondary to bilateral renal masses. [name] was admitted and received treatment for the bleed, pneumonia, anemia, and ESRD and discharged to home on 07/12/16.   Upon discharge, [name] was referred to [place] Management by the primary care navigator. Our telephonic team outreached to [name] who agreed to community case management services and was referred to me.   I have been unable to reach [name] for follow up until today. We last spoke a few weeks ago when she was referred to [place] [facility] the primary care navigator.  [name] was last hospitalized with pneumonia and has been having difficulty managing her Diabetes. She reported that she is checking her blood sugars twice daily and they have been in the 159-184 range. She says she is taking insulin in the evening and morning only (noted in medication list that patient is on q4h sliding scale). SheilaMullins says she has a poor appetite. She denies signs/symptoms of hypoglycemia. She has not seen her primary care provider and has had difficulty getting an appointment. I reached out to Dr. Josephine Cables office to request assistance.  Today, [name] says she saw Dr. Legrand Rams in the office on Friday. She feels she is doing well and isn't sure she wants to have anyone come see her at home at this time. However, she said she wouldn't mind if I returned a call to her in a few weeks to see how she is doing and whether she needs help.   Plan: I will reach out  to [name] again in 2 weeks by phone.    [place] Management  [phone]

## 2016-08-02 DIAGNOSIS — N2581 Secondary hyperparathyroidism of renal origin: Secondary | ICD-10-CM | POA: Diagnosis not present

## 2016-08-02 DIAGNOSIS — D509 Iron deficiency anemia, unspecified: Secondary | ICD-10-CM | POA: Diagnosis not present

## 2016-08-02 DIAGNOSIS — N186 End stage renal disease: Secondary | ICD-10-CM | POA: Diagnosis not present

## 2016-08-02 DIAGNOSIS — E1129 Type 2 diabetes mellitus with other diabetic kidney complication: Secondary | ICD-10-CM | POA: Diagnosis not present

## 2016-08-03 DIAGNOSIS — J440 Chronic obstructive pulmonary disease with acute lower respiratory infection: Secondary | ICD-10-CM | POA: Diagnosis not present

## 2016-08-03 DIAGNOSIS — D509 Iron deficiency anemia, unspecified: Secondary | ICD-10-CM | POA: Diagnosis not present

## 2016-08-03 DIAGNOSIS — K661 Hemoperitoneum: Secondary | ICD-10-CM | POA: Diagnosis not present

## 2016-08-03 DIAGNOSIS — J189 Pneumonia, unspecified organism: Secondary | ICD-10-CM | POA: Diagnosis not present

## 2016-08-03 DIAGNOSIS — D381 Neoplasm of uncertain behavior of trachea, bronchus and lung: Secondary | ICD-10-CM | POA: Diagnosis not present

## 2016-08-03 DIAGNOSIS — E1165 Type 2 diabetes mellitus with hyperglycemia: Secondary | ICD-10-CM | POA: Diagnosis not present

## 2016-08-04 DIAGNOSIS — D509 Iron deficiency anemia, unspecified: Secondary | ICD-10-CM | POA: Diagnosis not present

## 2016-08-04 DIAGNOSIS — E1129 Type 2 diabetes mellitus with other diabetic kidney complication: Secondary | ICD-10-CM | POA: Diagnosis not present

## 2016-08-04 DIAGNOSIS — N2581 Secondary hyperparathyroidism of renal origin: Secondary | ICD-10-CM | POA: Diagnosis not present

## 2016-08-04 DIAGNOSIS — N186 End stage renal disease: Secondary | ICD-10-CM | POA: Diagnosis not present

## 2016-08-05 ENCOUNTER — Inpatient Hospital Stay: Payer: Medicare Other | Admitting: Emergency Medicine

## 2016-08-05 DIAGNOSIS — K661 Hemoperitoneum: Secondary | ICD-10-CM | POA: Diagnosis not present

## 2016-08-05 DIAGNOSIS — D509 Iron deficiency anemia, unspecified: Secondary | ICD-10-CM | POA: Diagnosis not present

## 2016-08-05 DIAGNOSIS — J189 Pneumonia, unspecified organism: Secondary | ICD-10-CM | POA: Diagnosis not present

## 2016-08-05 DIAGNOSIS — E1165 Type 2 diabetes mellitus with hyperglycemia: Secondary | ICD-10-CM | POA: Diagnosis not present

## 2016-08-05 DIAGNOSIS — D381 Neoplasm of uncertain behavior of trachea, bronchus and lung: Secondary | ICD-10-CM | POA: Diagnosis not present

## 2016-08-05 DIAGNOSIS — J440 Chronic obstructive pulmonary disease with acute lower respiratory infection: Secondary | ICD-10-CM | POA: Diagnosis not present

## 2016-08-06 DIAGNOSIS — N186 End stage renal disease: Secondary | ICD-10-CM | POA: Diagnosis not present

## 2016-08-06 DIAGNOSIS — N2581 Secondary hyperparathyroidism of renal origin: Secondary | ICD-10-CM | POA: Diagnosis not present

## 2016-08-06 DIAGNOSIS — E1129 Type 2 diabetes mellitus with other diabetic kidney complication: Secondary | ICD-10-CM | POA: Diagnosis not present

## 2016-08-06 DIAGNOSIS — D509 Iron deficiency anemia, unspecified: Secondary | ICD-10-CM | POA: Diagnosis not present

## 2016-08-09 DIAGNOSIS — D509 Iron deficiency anemia, unspecified: Secondary | ICD-10-CM | POA: Diagnosis not present

## 2016-08-09 DIAGNOSIS — N186 End stage renal disease: Secondary | ICD-10-CM | POA: Diagnosis not present

## 2016-08-09 DIAGNOSIS — E1129 Type 2 diabetes mellitus with other diabetic kidney complication: Secondary | ICD-10-CM | POA: Diagnosis not present

## 2016-08-09 DIAGNOSIS — N2581 Secondary hyperparathyroidism of renal origin: Secondary | ICD-10-CM | POA: Diagnosis not present

## 2016-08-11 DIAGNOSIS — E1129 Type 2 diabetes mellitus with other diabetic kidney complication: Secondary | ICD-10-CM | POA: Diagnosis not present

## 2016-08-11 DIAGNOSIS — D509 Iron deficiency anemia, unspecified: Secondary | ICD-10-CM | POA: Diagnosis not present

## 2016-08-11 DIAGNOSIS — N2581 Secondary hyperparathyroidism of renal origin: Secondary | ICD-10-CM | POA: Diagnosis not present

## 2016-08-11 DIAGNOSIS — N186 End stage renal disease: Secondary | ICD-10-CM | POA: Diagnosis not present

## 2016-08-12 DIAGNOSIS — D381 Neoplasm of uncertain behavior of trachea, bronchus and lung: Secondary | ICD-10-CM | POA: Diagnosis not present

## 2016-08-12 DIAGNOSIS — J440 Chronic obstructive pulmonary disease with acute lower respiratory infection: Secondary | ICD-10-CM | POA: Diagnosis not present

## 2016-08-12 DIAGNOSIS — E1165 Type 2 diabetes mellitus with hyperglycemia: Secondary | ICD-10-CM | POA: Diagnosis not present

## 2016-08-12 DIAGNOSIS — D509 Iron deficiency anemia, unspecified: Secondary | ICD-10-CM | POA: Diagnosis not present

## 2016-08-12 DIAGNOSIS — J189 Pneumonia, unspecified organism: Secondary | ICD-10-CM | POA: Diagnosis not present

## 2016-08-12 DIAGNOSIS — K661 Hemoperitoneum: Secondary | ICD-10-CM | POA: Diagnosis not present

## 2016-08-13 DIAGNOSIS — E1129 Type 2 diabetes mellitus with other diabetic kidney complication: Secondary | ICD-10-CM | POA: Diagnosis not present

## 2016-08-13 DIAGNOSIS — N2581 Secondary hyperparathyroidism of renal origin: Secondary | ICD-10-CM | POA: Diagnosis not present

## 2016-08-13 DIAGNOSIS — N186 End stage renal disease: Secondary | ICD-10-CM | POA: Diagnosis not present

## 2016-08-13 DIAGNOSIS — D509 Iron deficiency anemia, unspecified: Secondary | ICD-10-CM | POA: Diagnosis not present

## 2016-08-15 ENCOUNTER — Encounter: Payer: Self-pay | Admitting: *Deleted

## 2016-08-15 ENCOUNTER — Other Ambulatory Visit: Payer: Self-pay | Admitting: *Deleted

## 2016-08-15 NOTE — Patient Outreach (Signed)
[facility]) Care Management  08/15/2016  ROX MCGRIFF 09-Dec-1945 [id number]   [name] is a 71 year old female patient with medical history significant forESRD (dialysis on Tuesday, Thursday, Saturday), anemia of ESRD, previously known kidney mass, lung mass, diabetes mellitus type II, uncontrolled, peripheral vascular disease,and hypertension.   [name] was discharged to home on 07/12/16  from the hospital after admission for pneumonia, anemia, and a retroperitoneal bleed secondary to bilateral renal masses.    Upon discharge, [name] was referred to [place] Management by the primary care navigator. Our telephonic team outreached to [name] who agreed to community case management services and was referred to me. When I spoke with her a few weeks ago, she'd just seen Dr. Legrand [name] in the office and felt she was doing well and wasn't sure she wanted to have anyone come see her at home.  She agreed to allow me to call her to check in.   I reached out to [name] by phone today. She reports that her fasting cbg this morning was 98. [name] eating three meals daily, exercising (walking), taking medications as prescribed, seeing her providers as scheduled and does not feel she needs case management services at this time.   I provided my contact information for [name] and offered to receive her call at any time in the future should she feel she is in need of case management services.   Plan: Case Closure.    [place] Management  [phone]

## 2016-08-16 DIAGNOSIS — N2581 Secondary hyperparathyroidism of renal origin: Secondary | ICD-10-CM | POA: Diagnosis not present

## 2016-08-16 DIAGNOSIS — D509 Iron deficiency anemia, unspecified: Secondary | ICD-10-CM | POA: Diagnosis not present

## 2016-08-16 DIAGNOSIS — N186 End stage renal disease: Secondary | ICD-10-CM | POA: Diagnosis not present

## 2016-08-16 DIAGNOSIS — E1129 Type 2 diabetes mellitus with other diabetic kidney complication: Secondary | ICD-10-CM | POA: Diagnosis not present

## 2016-08-18 DIAGNOSIS — N186 End stage renal disease: Secondary | ICD-10-CM | POA: Diagnosis not present

## 2016-08-18 DIAGNOSIS — N2581 Secondary hyperparathyroidism of renal origin: Secondary | ICD-10-CM | POA: Diagnosis not present

## 2016-08-18 DIAGNOSIS — E1129 Type 2 diabetes mellitus with other diabetic kidney complication: Secondary | ICD-10-CM | POA: Diagnosis not present

## 2016-08-18 DIAGNOSIS — D509 Iron deficiency anemia, unspecified: Secondary | ICD-10-CM | POA: Diagnosis not present

## 2016-08-19 DIAGNOSIS — D381 Neoplasm of uncertain behavior of trachea, bronchus and lung: Secondary | ICD-10-CM | POA: Diagnosis not present

## 2016-08-19 DIAGNOSIS — J189 Pneumonia, unspecified organism: Secondary | ICD-10-CM | POA: Diagnosis not present

## 2016-08-19 DIAGNOSIS — J440 Chronic obstructive pulmonary disease with acute lower respiratory infection: Secondary | ICD-10-CM | POA: Diagnosis not present

## 2016-08-19 DIAGNOSIS — D509 Iron deficiency anemia, unspecified: Secondary | ICD-10-CM | POA: Diagnosis not present

## 2016-08-19 DIAGNOSIS — E1165 Type 2 diabetes mellitus with hyperglycemia: Secondary | ICD-10-CM | POA: Diagnosis not present

## 2016-08-19 DIAGNOSIS — K661 Hemoperitoneum: Secondary | ICD-10-CM | POA: Diagnosis not present

## 2016-08-20 DIAGNOSIS — E1129 Type 2 diabetes mellitus with other diabetic kidney complication: Secondary | ICD-10-CM | POA: Diagnosis not present

## 2016-08-20 DIAGNOSIS — N2581 Secondary hyperparathyroidism of renal origin: Secondary | ICD-10-CM | POA: Diagnosis not present

## 2016-08-20 DIAGNOSIS — N186 End stage renal disease: Secondary | ICD-10-CM | POA: Diagnosis not present

## 2016-08-20 DIAGNOSIS — D509 Iron deficiency anemia, unspecified: Secondary | ICD-10-CM | POA: Diagnosis not present

## 2016-08-23 DIAGNOSIS — E1129 Type 2 diabetes mellitus with other diabetic kidney complication: Secondary | ICD-10-CM | POA: Diagnosis not present

## 2016-08-23 DIAGNOSIS — N2581 Secondary hyperparathyroidism of renal origin: Secondary | ICD-10-CM | POA: Diagnosis not present

## 2016-08-23 DIAGNOSIS — D509 Iron deficiency anemia, unspecified: Secondary | ICD-10-CM | POA: Diagnosis not present

## 2016-08-23 DIAGNOSIS — N186 End stage renal disease: Secondary | ICD-10-CM | POA: Diagnosis not present

## 2016-08-24 DIAGNOSIS — E1165 Type 2 diabetes mellitus with hyperglycemia: Secondary | ICD-10-CM | POA: Diagnosis not present

## 2016-08-24 DIAGNOSIS — K661 Hemoperitoneum: Secondary | ICD-10-CM | POA: Diagnosis not present

## 2016-08-24 DIAGNOSIS — J440 Chronic obstructive pulmonary disease with acute lower respiratory infection: Secondary | ICD-10-CM | POA: Diagnosis not present

## 2016-08-24 DIAGNOSIS — D381 Neoplasm of uncertain behavior of trachea, bronchus and lung: Secondary | ICD-10-CM | POA: Diagnosis not present

## 2016-08-24 DIAGNOSIS — J189 Pneumonia, unspecified organism: Secondary | ICD-10-CM | POA: Diagnosis not present

## 2016-08-24 DIAGNOSIS — D509 Iron deficiency anemia, unspecified: Secondary | ICD-10-CM | POA: Diagnosis not present

## 2016-08-25 DIAGNOSIS — E1129 Type 2 diabetes mellitus with other diabetic kidney complication: Secondary | ICD-10-CM | POA: Diagnosis not present

## 2016-08-25 DIAGNOSIS — D509 Iron deficiency anemia, unspecified: Secondary | ICD-10-CM | POA: Diagnosis not present

## 2016-08-25 DIAGNOSIS — N186 End stage renal disease: Secondary | ICD-10-CM | POA: Diagnosis not present

## 2016-08-25 DIAGNOSIS — N2581 Secondary hyperparathyroidism of renal origin: Secondary | ICD-10-CM | POA: Diagnosis not present

## 2016-08-27 DIAGNOSIS — D509 Iron deficiency anemia, unspecified: Secondary | ICD-10-CM | POA: Diagnosis not present

## 2016-08-27 DIAGNOSIS — N186 End stage renal disease: Secondary | ICD-10-CM | POA: Diagnosis not present

## 2016-08-27 DIAGNOSIS — Z992 Dependence on renal dialysis: Secondary | ICD-10-CM | POA: Diagnosis not present

## 2016-08-27 DIAGNOSIS — N2581 Secondary hyperparathyroidism of renal origin: Secondary | ICD-10-CM | POA: Diagnosis not present

## 2016-08-27 DIAGNOSIS — E1129 Type 2 diabetes mellitus with other diabetic kidney complication: Secondary | ICD-10-CM | POA: Diagnosis not present

## 2016-08-29 DIAGNOSIS — D509 Iron deficiency anemia, unspecified: Secondary | ICD-10-CM | POA: Diagnosis not present

## 2016-08-29 DIAGNOSIS — J440 Chronic obstructive pulmonary disease with acute lower respiratory infection: Secondary | ICD-10-CM | POA: Diagnosis not present

## 2016-08-29 DIAGNOSIS — D381 Neoplasm of uncertain behavior of trachea, bronchus and lung: Secondary | ICD-10-CM | POA: Diagnosis not present

## 2016-08-29 DIAGNOSIS — K661 Hemoperitoneum: Secondary | ICD-10-CM | POA: Diagnosis not present

## 2016-08-29 DIAGNOSIS — J189 Pneumonia, unspecified organism: Secondary | ICD-10-CM | POA: Diagnosis not present

## 2016-08-29 DIAGNOSIS — E1165 Type 2 diabetes mellitus with hyperglycemia: Secondary | ICD-10-CM | POA: Diagnosis not present

## 2016-08-30 DIAGNOSIS — D509 Iron deficiency anemia, unspecified: Secondary | ICD-10-CM | POA: Diagnosis not present

## 2016-08-30 DIAGNOSIS — N186 End stage renal disease: Secondary | ICD-10-CM | POA: Diagnosis not present

## 2016-08-30 DIAGNOSIS — E1129 Type 2 diabetes mellitus with other diabetic kidney complication: Secondary | ICD-10-CM | POA: Diagnosis not present

## 2016-08-30 DIAGNOSIS — N2581 Secondary hyperparathyroidism of renal origin: Secondary | ICD-10-CM | POA: Diagnosis not present

## 2016-09-01 DIAGNOSIS — N2581 Secondary hyperparathyroidism of renal origin: Secondary | ICD-10-CM | POA: Diagnosis not present

## 2016-09-01 DIAGNOSIS — D509 Iron deficiency anemia, unspecified: Secondary | ICD-10-CM | POA: Diagnosis not present

## 2016-09-01 DIAGNOSIS — N186 End stage renal disease: Secondary | ICD-10-CM | POA: Diagnosis not present

## 2016-09-01 DIAGNOSIS — E1129 Type 2 diabetes mellitus with other diabetic kidney complication: Secondary | ICD-10-CM | POA: Diagnosis not present

## 2016-09-03 DIAGNOSIS — N2581 Secondary hyperparathyroidism of renal origin: Secondary | ICD-10-CM | POA: Diagnosis not present

## 2016-09-03 DIAGNOSIS — D509 Iron deficiency anemia, unspecified: Secondary | ICD-10-CM | POA: Diagnosis not present

## 2016-09-03 DIAGNOSIS — E1129 Type 2 diabetes mellitus with other diabetic kidney complication: Secondary | ICD-10-CM | POA: Diagnosis not present

## 2016-09-03 DIAGNOSIS — N186 End stage renal disease: Secondary | ICD-10-CM | POA: Diagnosis not present

## 2016-09-05 DIAGNOSIS — D381 Neoplasm of uncertain behavior of trachea, bronchus and lung: Secondary | ICD-10-CM | POA: Diagnosis not present

## 2016-09-05 DIAGNOSIS — K661 Hemoperitoneum: Secondary | ICD-10-CM | POA: Diagnosis not present

## 2016-09-05 DIAGNOSIS — E1165 Type 2 diabetes mellitus with hyperglycemia: Secondary | ICD-10-CM | POA: Diagnosis not present

## 2016-09-05 DIAGNOSIS — J189 Pneumonia, unspecified organism: Secondary | ICD-10-CM | POA: Diagnosis not present

## 2016-09-05 DIAGNOSIS — J440 Chronic obstructive pulmonary disease with acute lower respiratory infection: Secondary | ICD-10-CM | POA: Diagnosis not present

## 2016-09-05 DIAGNOSIS — D509 Iron deficiency anemia, unspecified: Secondary | ICD-10-CM | POA: Diagnosis not present

## 2016-09-06 DIAGNOSIS — N186 End stage renal disease: Secondary | ICD-10-CM | POA: Diagnosis not present

## 2016-09-06 DIAGNOSIS — E1129 Type 2 diabetes mellitus with other diabetic kidney complication: Secondary | ICD-10-CM | POA: Diagnosis not present

## 2016-09-06 DIAGNOSIS — D509 Iron deficiency anemia, unspecified: Secondary | ICD-10-CM | POA: Diagnosis not present

## 2016-09-06 DIAGNOSIS — N2581 Secondary hyperparathyroidism of renal origin: Secondary | ICD-10-CM | POA: Diagnosis not present

## 2016-09-08 DIAGNOSIS — N186 End stage renal disease: Secondary | ICD-10-CM | POA: Diagnosis not present

## 2016-09-08 DIAGNOSIS — E1129 Type 2 diabetes mellitus with other diabetic kidney complication: Secondary | ICD-10-CM | POA: Diagnosis not present

## 2016-09-08 DIAGNOSIS — N2581 Secondary hyperparathyroidism of renal origin: Secondary | ICD-10-CM | POA: Diagnosis not present

## 2016-09-08 DIAGNOSIS — D509 Iron deficiency anemia, unspecified: Secondary | ICD-10-CM | POA: Diagnosis not present

## 2016-09-10 DIAGNOSIS — E1129 Type 2 diabetes mellitus with other diabetic kidney complication: Secondary | ICD-10-CM | POA: Diagnosis not present

## 2016-09-10 DIAGNOSIS — N2581 Secondary hyperparathyroidism of renal origin: Secondary | ICD-10-CM | POA: Diagnosis not present

## 2016-09-10 DIAGNOSIS — D509 Iron deficiency anemia, unspecified: Secondary | ICD-10-CM | POA: Diagnosis not present

## 2016-09-10 DIAGNOSIS — N186 End stage renal disease: Secondary | ICD-10-CM | POA: Diagnosis not present

## 2016-09-13 DIAGNOSIS — N186 End stage renal disease: Secondary | ICD-10-CM | POA: Diagnosis not present

## 2016-09-13 DIAGNOSIS — D509 Iron deficiency anemia, unspecified: Secondary | ICD-10-CM | POA: Diagnosis not present

## 2016-09-13 DIAGNOSIS — N2581 Secondary hyperparathyroidism of renal origin: Secondary | ICD-10-CM | POA: Diagnosis not present

## 2016-09-13 DIAGNOSIS — E1129 Type 2 diabetes mellitus with other diabetic kidney complication: Secondary | ICD-10-CM | POA: Diagnosis not present

## 2016-09-15 DIAGNOSIS — N186 End stage renal disease: Secondary | ICD-10-CM | POA: Diagnosis not present

## 2016-09-15 DIAGNOSIS — N2581 Secondary hyperparathyroidism of renal origin: Secondary | ICD-10-CM | POA: Diagnosis not present

## 2016-09-15 DIAGNOSIS — D509 Iron deficiency anemia, unspecified: Secondary | ICD-10-CM | POA: Diagnosis not present

## 2016-09-15 DIAGNOSIS — E1129 Type 2 diabetes mellitus with other diabetic kidney complication: Secondary | ICD-10-CM | POA: Diagnosis not present

## 2016-09-17 DIAGNOSIS — N186 End stage renal disease: Secondary | ICD-10-CM | POA: Diagnosis not present

## 2016-09-17 DIAGNOSIS — E1129 Type 2 diabetes mellitus with other diabetic kidney complication: Secondary | ICD-10-CM | POA: Diagnosis not present

## 2016-09-17 DIAGNOSIS — D509 Iron deficiency anemia, unspecified: Secondary | ICD-10-CM | POA: Diagnosis not present

## 2016-09-17 DIAGNOSIS — N2581 Secondary hyperparathyroidism of renal origin: Secondary | ICD-10-CM | POA: Diagnosis not present

## 2016-09-20 DIAGNOSIS — N2581 Secondary hyperparathyroidism of renal origin: Secondary | ICD-10-CM | POA: Diagnosis not present

## 2016-09-20 DIAGNOSIS — D509 Iron deficiency anemia, unspecified: Secondary | ICD-10-CM | POA: Diagnosis not present

## 2016-09-20 DIAGNOSIS — E1129 Type 2 diabetes mellitus with other diabetic kidney complication: Secondary | ICD-10-CM | POA: Diagnosis not present

## 2016-09-20 DIAGNOSIS — N186 End stage renal disease: Secondary | ICD-10-CM | POA: Diagnosis not present

## 2016-09-22 DIAGNOSIS — E1129 Type 2 diabetes mellitus with other diabetic kidney complication: Secondary | ICD-10-CM | POA: Diagnosis not present

## 2016-09-22 DIAGNOSIS — N186 End stage renal disease: Secondary | ICD-10-CM | POA: Diagnosis not present

## 2016-09-22 DIAGNOSIS — N2581 Secondary hyperparathyroidism of renal origin: Secondary | ICD-10-CM | POA: Diagnosis not present

## 2016-09-22 DIAGNOSIS — D509 Iron deficiency anemia, unspecified: Secondary | ICD-10-CM | POA: Diagnosis not present

## 2016-09-24 DIAGNOSIS — D509 Iron deficiency anemia, unspecified: Secondary | ICD-10-CM | POA: Diagnosis not present

## 2016-09-24 DIAGNOSIS — N186 End stage renal disease: Secondary | ICD-10-CM | POA: Diagnosis not present

## 2016-09-24 DIAGNOSIS — E1129 Type 2 diabetes mellitus with other diabetic kidney complication: Secondary | ICD-10-CM | POA: Diagnosis not present

## 2016-09-24 DIAGNOSIS — N2581 Secondary hyperparathyroidism of renal origin: Secondary | ICD-10-CM | POA: Diagnosis not present

## 2016-09-26 DIAGNOSIS — N186 End stage renal disease: Secondary | ICD-10-CM | POA: Diagnosis not present

## 2016-09-26 DIAGNOSIS — Z992 Dependence on renal dialysis: Secondary | ICD-10-CM | POA: Diagnosis not present

## 2016-09-26 DIAGNOSIS — E1129 Type 2 diabetes mellitus with other diabetic kidney complication: Secondary | ICD-10-CM | POA: Diagnosis not present

## 2016-09-27 DIAGNOSIS — N2581 Secondary hyperparathyroidism of renal origin: Secondary | ICD-10-CM | POA: Diagnosis not present

## 2016-09-27 DIAGNOSIS — E1129 Type 2 diabetes mellitus with other diabetic kidney complication: Secondary | ICD-10-CM | POA: Diagnosis not present

## 2016-09-27 DIAGNOSIS — D631 Anemia in chronic kidney disease: Secondary | ICD-10-CM | POA: Diagnosis not present

## 2016-09-27 DIAGNOSIS — N186 End stage renal disease: Secondary | ICD-10-CM | POA: Diagnosis not present

## 2016-09-27 DIAGNOSIS — D509 Iron deficiency anemia, unspecified: Secondary | ICD-10-CM | POA: Diagnosis not present

## 2016-09-29 DIAGNOSIS — N2581 Secondary hyperparathyroidism of renal origin: Secondary | ICD-10-CM | POA: Diagnosis not present

## 2016-09-29 DIAGNOSIS — E1129 Type 2 diabetes mellitus with other diabetic kidney complication: Secondary | ICD-10-CM | POA: Diagnosis not present

## 2016-09-29 DIAGNOSIS — D631 Anemia in chronic kidney disease: Secondary | ICD-10-CM | POA: Diagnosis not present

## 2016-09-29 DIAGNOSIS — D509 Iron deficiency anemia, unspecified: Secondary | ICD-10-CM | POA: Diagnosis not present

## 2016-09-29 DIAGNOSIS — N186 End stage renal disease: Secondary | ICD-10-CM | POA: Diagnosis not present

## 2016-10-01 DIAGNOSIS — N2581 Secondary hyperparathyroidism of renal origin: Secondary | ICD-10-CM | POA: Diagnosis not present

## 2016-10-01 DIAGNOSIS — D631 Anemia in chronic kidney disease: Secondary | ICD-10-CM | POA: Diagnosis not present

## 2016-10-01 DIAGNOSIS — D509 Iron deficiency anemia, unspecified: Secondary | ICD-10-CM | POA: Diagnosis not present

## 2016-10-01 DIAGNOSIS — E1129 Type 2 diabetes mellitus with other diabetic kidney complication: Secondary | ICD-10-CM | POA: Diagnosis not present

## 2016-10-01 DIAGNOSIS — N186 End stage renal disease: Secondary | ICD-10-CM | POA: Diagnosis not present

## 2016-10-04 DIAGNOSIS — N186 End stage renal disease: Secondary | ICD-10-CM | POA: Diagnosis not present

## 2016-10-04 DIAGNOSIS — E1129 Type 2 diabetes mellitus with other diabetic kidney complication: Secondary | ICD-10-CM | POA: Diagnosis not present

## 2016-10-04 DIAGNOSIS — N2581 Secondary hyperparathyroidism of renal origin: Secondary | ICD-10-CM | POA: Diagnosis not present

## 2016-10-04 DIAGNOSIS — D631 Anemia in chronic kidney disease: Secondary | ICD-10-CM | POA: Diagnosis not present

## 2016-10-04 DIAGNOSIS — D509 Iron deficiency anemia, unspecified: Secondary | ICD-10-CM | POA: Diagnosis not present

## 2016-10-06 DIAGNOSIS — E1129 Type 2 diabetes mellitus with other diabetic kidney complication: Secondary | ICD-10-CM | POA: Diagnosis not present

## 2016-10-06 DIAGNOSIS — D631 Anemia in chronic kidney disease: Secondary | ICD-10-CM | POA: Diagnosis not present

## 2016-10-06 DIAGNOSIS — D509 Iron deficiency anemia, unspecified: Secondary | ICD-10-CM | POA: Diagnosis not present

## 2016-10-06 DIAGNOSIS — N186 End stage renal disease: Secondary | ICD-10-CM | POA: Diagnosis not present

## 2016-10-06 DIAGNOSIS — N2581 Secondary hyperparathyroidism of renal origin: Secondary | ICD-10-CM | POA: Diagnosis not present

## 2016-10-08 DIAGNOSIS — D631 Anemia in chronic kidney disease: Secondary | ICD-10-CM | POA: Diagnosis not present

## 2016-10-08 DIAGNOSIS — N2581 Secondary hyperparathyroidism of renal origin: Secondary | ICD-10-CM | POA: Diagnosis not present

## 2016-10-08 DIAGNOSIS — N186 End stage renal disease: Secondary | ICD-10-CM | POA: Diagnosis not present

## 2016-10-08 DIAGNOSIS — E1129 Type 2 diabetes mellitus with other diabetic kidney complication: Secondary | ICD-10-CM | POA: Diagnosis not present

## 2016-10-08 DIAGNOSIS — D509 Iron deficiency anemia, unspecified: Secondary | ICD-10-CM | POA: Diagnosis not present

## 2016-10-11 DIAGNOSIS — D509 Iron deficiency anemia, unspecified: Secondary | ICD-10-CM | POA: Diagnosis not present

## 2016-10-11 DIAGNOSIS — N2581 Secondary hyperparathyroidism of renal origin: Secondary | ICD-10-CM | POA: Diagnosis not present

## 2016-10-11 DIAGNOSIS — N186 End stage renal disease: Secondary | ICD-10-CM | POA: Diagnosis not present

## 2016-10-11 DIAGNOSIS — E1129 Type 2 diabetes mellitus with other diabetic kidney complication: Secondary | ICD-10-CM | POA: Diagnosis not present

## 2016-10-11 DIAGNOSIS — D631 Anemia in chronic kidney disease: Secondary | ICD-10-CM | POA: Diagnosis not present

## 2016-10-13 DIAGNOSIS — D631 Anemia in chronic kidney disease: Secondary | ICD-10-CM | POA: Diagnosis not present

## 2016-10-13 DIAGNOSIS — N2581 Secondary hyperparathyroidism of renal origin: Secondary | ICD-10-CM | POA: Diagnosis not present

## 2016-10-13 DIAGNOSIS — D509 Iron deficiency anemia, unspecified: Secondary | ICD-10-CM | POA: Diagnosis not present

## 2016-10-13 DIAGNOSIS — E1129 Type 2 diabetes mellitus with other diabetic kidney complication: Secondary | ICD-10-CM | POA: Diagnosis not present

## 2016-10-13 DIAGNOSIS — N186 End stage renal disease: Secondary | ICD-10-CM | POA: Diagnosis not present

## 2016-10-15 DIAGNOSIS — D509 Iron deficiency anemia, unspecified: Secondary | ICD-10-CM | POA: Diagnosis not present

## 2016-10-15 DIAGNOSIS — N186 End stage renal disease: Secondary | ICD-10-CM | POA: Diagnosis not present

## 2016-10-15 DIAGNOSIS — N2581 Secondary hyperparathyroidism of renal origin: Secondary | ICD-10-CM | POA: Diagnosis not present

## 2016-10-15 DIAGNOSIS — E1129 Type 2 diabetes mellitus with other diabetic kidney complication: Secondary | ICD-10-CM | POA: Diagnosis not present

## 2016-10-15 DIAGNOSIS — D631 Anemia in chronic kidney disease: Secondary | ICD-10-CM | POA: Diagnosis not present

## 2016-10-18 DIAGNOSIS — E1129 Type 2 diabetes mellitus with other diabetic kidney complication: Secondary | ICD-10-CM | POA: Diagnosis not present

## 2016-10-18 DIAGNOSIS — D509 Iron deficiency anemia, unspecified: Secondary | ICD-10-CM | POA: Diagnosis not present

## 2016-10-18 DIAGNOSIS — N186 End stage renal disease: Secondary | ICD-10-CM | POA: Diagnosis not present

## 2016-10-18 DIAGNOSIS — N2581 Secondary hyperparathyroidism of renal origin: Secondary | ICD-10-CM | POA: Diagnosis not present

## 2016-10-18 DIAGNOSIS — D631 Anemia in chronic kidney disease: Secondary | ICD-10-CM | POA: Diagnosis not present

## 2016-10-20 DIAGNOSIS — N2581 Secondary hyperparathyroidism of renal origin: Secondary | ICD-10-CM | POA: Diagnosis not present

## 2016-10-20 DIAGNOSIS — D509 Iron deficiency anemia, unspecified: Secondary | ICD-10-CM | POA: Diagnosis not present

## 2016-10-20 DIAGNOSIS — D631 Anemia in chronic kidney disease: Secondary | ICD-10-CM | POA: Diagnosis not present

## 2016-10-20 DIAGNOSIS — N186 End stage renal disease: Secondary | ICD-10-CM | POA: Diagnosis not present

## 2016-10-20 DIAGNOSIS — E1129 Type 2 diabetes mellitus with other diabetic kidney complication: Secondary | ICD-10-CM | POA: Diagnosis not present

## 2016-10-21 DIAGNOSIS — E1342 Other specified diabetes mellitus with diabetic polyneuropathy: Secondary | ICD-10-CM | POA: Diagnosis not present

## 2016-10-21 DIAGNOSIS — I10 Essential (primary) hypertension: Secondary | ICD-10-CM | POA: Diagnosis not present

## 2016-10-21 DIAGNOSIS — Z89612 Acquired absence of left leg above knee: Secondary | ICD-10-CM | POA: Diagnosis not present

## 2016-10-21 DIAGNOSIS — N186 End stage renal disease: Secondary | ICD-10-CM | POA: Diagnosis not present

## 2016-10-22 DIAGNOSIS — N186 End stage renal disease: Secondary | ICD-10-CM | POA: Diagnosis not present

## 2016-10-22 DIAGNOSIS — E1129 Type 2 diabetes mellitus with other diabetic kidney complication: Secondary | ICD-10-CM | POA: Diagnosis not present

## 2016-10-22 DIAGNOSIS — D631 Anemia in chronic kidney disease: Secondary | ICD-10-CM | POA: Diagnosis not present

## 2016-10-22 DIAGNOSIS — N2581 Secondary hyperparathyroidism of renal origin: Secondary | ICD-10-CM | POA: Diagnosis not present

## 2016-10-22 DIAGNOSIS — D509 Iron deficiency anemia, unspecified: Secondary | ICD-10-CM | POA: Diagnosis not present

## 2016-10-25 DIAGNOSIS — D509 Iron deficiency anemia, unspecified: Secondary | ICD-10-CM | POA: Diagnosis not present

## 2016-10-25 DIAGNOSIS — D631 Anemia in chronic kidney disease: Secondary | ICD-10-CM | POA: Diagnosis not present

## 2016-10-25 DIAGNOSIS — N186 End stage renal disease: Secondary | ICD-10-CM | POA: Diagnosis not present

## 2016-10-25 DIAGNOSIS — E1129 Type 2 diabetes mellitus with other diabetic kidney complication: Secondary | ICD-10-CM | POA: Diagnosis not present

## 2016-10-25 DIAGNOSIS — N2581 Secondary hyperparathyroidism of renal origin: Secondary | ICD-10-CM | POA: Diagnosis not present

## 2016-10-27 DIAGNOSIS — D631 Anemia in chronic kidney disease: Secondary | ICD-10-CM | POA: Diagnosis not present

## 2016-10-27 DIAGNOSIS — N2581 Secondary hyperparathyroidism of renal origin: Secondary | ICD-10-CM | POA: Diagnosis not present

## 2016-10-27 DIAGNOSIS — N186 End stage renal disease: Secondary | ICD-10-CM | POA: Diagnosis not present

## 2016-10-27 DIAGNOSIS — D509 Iron deficiency anemia, unspecified: Secondary | ICD-10-CM | POA: Diagnosis not present

## 2016-10-27 DIAGNOSIS — E1129 Type 2 diabetes mellitus with other diabetic kidney complication: Secondary | ICD-10-CM | POA: Diagnosis not present

## 2016-10-27 DIAGNOSIS — Z992 Dependence on renal dialysis: Secondary | ICD-10-CM | POA: Diagnosis not present

## 2016-10-29 DIAGNOSIS — E1129 Type 2 diabetes mellitus with other diabetic kidney complication: Secondary | ICD-10-CM | POA: Diagnosis not present

## 2016-10-29 DIAGNOSIS — D631 Anemia in chronic kidney disease: Secondary | ICD-10-CM | POA: Diagnosis not present

## 2016-10-29 DIAGNOSIS — D509 Iron deficiency anemia, unspecified: Secondary | ICD-10-CM | POA: Diagnosis not present

## 2016-10-29 DIAGNOSIS — N186 End stage renal disease: Secondary | ICD-10-CM | POA: Diagnosis not present

## 2016-10-29 DIAGNOSIS — N2581 Secondary hyperparathyroidism of renal origin: Secondary | ICD-10-CM | POA: Diagnosis not present

## 2016-10-29 DIAGNOSIS — E8779 Other fluid overload: Secondary | ICD-10-CM | POA: Diagnosis not present

## 2016-11-01 DIAGNOSIS — D631 Anemia in chronic kidney disease: Secondary | ICD-10-CM | POA: Diagnosis not present

## 2016-11-01 DIAGNOSIS — N186 End stage renal disease: Secondary | ICD-10-CM | POA: Diagnosis not present

## 2016-11-01 DIAGNOSIS — N2581 Secondary hyperparathyroidism of renal origin: Secondary | ICD-10-CM | POA: Diagnosis not present

## 2016-11-01 DIAGNOSIS — D509 Iron deficiency anemia, unspecified: Secondary | ICD-10-CM | POA: Diagnosis not present

## 2016-11-01 DIAGNOSIS — E1129 Type 2 diabetes mellitus with other diabetic kidney complication: Secondary | ICD-10-CM | POA: Diagnosis not present

## 2016-11-01 DIAGNOSIS — E8779 Other fluid overload: Secondary | ICD-10-CM | POA: Diagnosis not present

## 2016-11-03 DIAGNOSIS — E1129 Type 2 diabetes mellitus with other diabetic kidney complication: Secondary | ICD-10-CM | POA: Diagnosis not present

## 2016-11-03 DIAGNOSIS — N2581 Secondary hyperparathyroidism of renal origin: Secondary | ICD-10-CM | POA: Diagnosis not present

## 2016-11-03 DIAGNOSIS — N186 End stage renal disease: Secondary | ICD-10-CM | POA: Diagnosis not present

## 2016-11-03 DIAGNOSIS — D509 Iron deficiency anemia, unspecified: Secondary | ICD-10-CM | POA: Diagnosis not present

## 2016-11-03 DIAGNOSIS — D631 Anemia in chronic kidney disease: Secondary | ICD-10-CM | POA: Diagnosis not present

## 2016-11-03 DIAGNOSIS — E8779 Other fluid overload: Secondary | ICD-10-CM | POA: Diagnosis not present

## 2016-11-04 DIAGNOSIS — N2581 Secondary hyperparathyroidism of renal origin: Secondary | ICD-10-CM | POA: Diagnosis not present

## 2016-11-04 DIAGNOSIS — E1129 Type 2 diabetes mellitus with other diabetic kidney complication: Secondary | ICD-10-CM | POA: Diagnosis not present

## 2016-11-04 DIAGNOSIS — D631 Anemia in chronic kidney disease: Secondary | ICD-10-CM | POA: Diagnosis not present

## 2016-11-04 DIAGNOSIS — E8779 Other fluid overload: Secondary | ICD-10-CM | POA: Diagnosis not present

## 2016-11-04 DIAGNOSIS — D509 Iron deficiency anemia, unspecified: Secondary | ICD-10-CM | POA: Diagnosis not present

## 2016-11-04 DIAGNOSIS — N186 End stage renal disease: Secondary | ICD-10-CM | POA: Diagnosis not present

## 2016-11-08 DIAGNOSIS — E1129 Type 2 diabetes mellitus with other diabetic kidney complication: Secondary | ICD-10-CM | POA: Diagnosis not present

## 2016-11-08 DIAGNOSIS — N186 End stage renal disease: Secondary | ICD-10-CM | POA: Diagnosis not present

## 2016-11-08 DIAGNOSIS — N2581 Secondary hyperparathyroidism of renal origin: Secondary | ICD-10-CM | POA: Diagnosis not present

## 2016-11-08 DIAGNOSIS — D509 Iron deficiency anemia, unspecified: Secondary | ICD-10-CM | POA: Diagnosis not present

## 2016-11-08 DIAGNOSIS — D631 Anemia in chronic kidney disease: Secondary | ICD-10-CM | POA: Diagnosis not present

## 2016-11-08 DIAGNOSIS — E8779 Other fluid overload: Secondary | ICD-10-CM | POA: Diagnosis not present

## 2016-11-09 DIAGNOSIS — B351 Tinea unguium: Secondary | ICD-10-CM | POA: Diagnosis not present

## 2016-11-09 DIAGNOSIS — E1140 Type 2 diabetes mellitus with diabetic neuropathy, unspecified: Secondary | ICD-10-CM | POA: Diagnosis not present

## 2016-11-09 DIAGNOSIS — E1151 Type 2 diabetes mellitus with diabetic peripheral angiopathy without gangrene: Secondary | ICD-10-CM | POA: Diagnosis not present

## 2016-11-10 DIAGNOSIS — N2581 Secondary hyperparathyroidism of renal origin: Secondary | ICD-10-CM | POA: Diagnosis not present

## 2016-11-10 DIAGNOSIS — D509 Iron deficiency anemia, unspecified: Secondary | ICD-10-CM | POA: Diagnosis not present

## 2016-11-10 DIAGNOSIS — D631 Anemia in chronic kidney disease: Secondary | ICD-10-CM | POA: Diagnosis not present

## 2016-11-10 DIAGNOSIS — N186 End stage renal disease: Secondary | ICD-10-CM | POA: Diagnosis not present

## 2016-11-10 DIAGNOSIS — E8779 Other fluid overload: Secondary | ICD-10-CM | POA: Diagnosis not present

## 2016-11-10 DIAGNOSIS — E1129 Type 2 diabetes mellitus with other diabetic kidney complication: Secondary | ICD-10-CM | POA: Diagnosis not present

## 2016-11-12 DIAGNOSIS — N186 End stage renal disease: Secondary | ICD-10-CM | POA: Diagnosis not present

## 2016-11-12 DIAGNOSIS — E1129 Type 2 diabetes mellitus with other diabetic kidney complication: Secondary | ICD-10-CM | POA: Diagnosis not present

## 2016-11-12 DIAGNOSIS — N2581 Secondary hyperparathyroidism of renal origin: Secondary | ICD-10-CM | POA: Diagnosis not present

## 2016-11-12 DIAGNOSIS — D631 Anemia in chronic kidney disease: Secondary | ICD-10-CM | POA: Diagnosis not present

## 2016-11-12 DIAGNOSIS — D509 Iron deficiency anemia, unspecified: Secondary | ICD-10-CM | POA: Diagnosis not present

## 2016-11-12 DIAGNOSIS — E8779 Other fluid overload: Secondary | ICD-10-CM | POA: Diagnosis not present

## 2016-11-15 DIAGNOSIS — E1129 Type 2 diabetes mellitus with other diabetic kidney complication: Secondary | ICD-10-CM | POA: Diagnosis not present

## 2016-11-15 DIAGNOSIS — D631 Anemia in chronic kidney disease: Secondary | ICD-10-CM | POA: Diagnosis not present

## 2016-11-15 DIAGNOSIS — N186 End stage renal disease: Secondary | ICD-10-CM | POA: Diagnosis not present

## 2016-11-15 DIAGNOSIS — E8779 Other fluid overload: Secondary | ICD-10-CM | POA: Diagnosis not present

## 2016-11-15 DIAGNOSIS — N2581 Secondary hyperparathyroidism of renal origin: Secondary | ICD-10-CM | POA: Diagnosis not present

## 2016-11-15 DIAGNOSIS — D509 Iron deficiency anemia, unspecified: Secondary | ICD-10-CM | POA: Diagnosis not present

## 2016-11-17 DIAGNOSIS — E8779 Other fluid overload: Secondary | ICD-10-CM | POA: Diagnosis not present

## 2016-11-17 DIAGNOSIS — D631 Anemia in chronic kidney disease: Secondary | ICD-10-CM | POA: Diagnosis not present

## 2016-11-17 DIAGNOSIS — N186 End stage renal disease: Secondary | ICD-10-CM | POA: Diagnosis not present

## 2016-11-17 DIAGNOSIS — E1129 Type 2 diabetes mellitus with other diabetic kidney complication: Secondary | ICD-10-CM | POA: Diagnosis not present

## 2016-11-17 DIAGNOSIS — D509 Iron deficiency anemia, unspecified: Secondary | ICD-10-CM | POA: Diagnosis not present

## 2016-11-17 DIAGNOSIS — N2581 Secondary hyperparathyroidism of renal origin: Secondary | ICD-10-CM | POA: Diagnosis not present

## 2016-11-19 DIAGNOSIS — N186 End stage renal disease: Secondary | ICD-10-CM | POA: Diagnosis not present

## 2016-11-19 DIAGNOSIS — D631 Anemia in chronic kidney disease: Secondary | ICD-10-CM | POA: Diagnosis not present

## 2016-11-19 DIAGNOSIS — N2581 Secondary hyperparathyroidism of renal origin: Secondary | ICD-10-CM | POA: Diagnosis not present

## 2016-11-19 DIAGNOSIS — E1129 Type 2 diabetes mellitus with other diabetic kidney complication: Secondary | ICD-10-CM | POA: Diagnosis not present

## 2016-11-19 DIAGNOSIS — E8779 Other fluid overload: Secondary | ICD-10-CM | POA: Diagnosis not present

## 2016-11-19 DIAGNOSIS — D509 Iron deficiency anemia, unspecified: Secondary | ICD-10-CM | POA: Diagnosis not present

## 2016-11-22 DIAGNOSIS — E1129 Type 2 diabetes mellitus with other diabetic kidney complication: Secondary | ICD-10-CM | POA: Diagnosis not present

## 2016-11-22 DIAGNOSIS — D631 Anemia in chronic kidney disease: Secondary | ICD-10-CM | POA: Diagnosis not present

## 2016-11-22 DIAGNOSIS — E8779 Other fluid overload: Secondary | ICD-10-CM | POA: Diagnosis not present

## 2016-11-22 DIAGNOSIS — D509 Iron deficiency anemia, unspecified: Secondary | ICD-10-CM | POA: Diagnosis not present

## 2016-11-22 DIAGNOSIS — N186 End stage renal disease: Secondary | ICD-10-CM | POA: Diagnosis not present

## 2016-11-22 DIAGNOSIS — N2581 Secondary hyperparathyroidism of renal origin: Secondary | ICD-10-CM | POA: Diagnosis not present

## 2016-11-23 DIAGNOSIS — N186 End stage renal disease: Secondary | ICD-10-CM | POA: Diagnosis not present

## 2016-11-23 DIAGNOSIS — D631 Anemia in chronic kidney disease: Secondary | ICD-10-CM | POA: Diagnosis not present

## 2016-11-23 DIAGNOSIS — N2581 Secondary hyperparathyroidism of renal origin: Secondary | ICD-10-CM | POA: Diagnosis not present

## 2016-11-23 DIAGNOSIS — E1129 Type 2 diabetes mellitus with other diabetic kidney complication: Secondary | ICD-10-CM | POA: Diagnosis not present

## 2016-11-23 DIAGNOSIS — E8779 Other fluid overload: Secondary | ICD-10-CM | POA: Diagnosis not present

## 2016-11-23 DIAGNOSIS — D509 Iron deficiency anemia, unspecified: Secondary | ICD-10-CM | POA: Diagnosis not present

## 2016-11-24 DIAGNOSIS — D631 Anemia in chronic kidney disease: Secondary | ICD-10-CM | POA: Diagnosis not present

## 2016-11-24 DIAGNOSIS — N2581 Secondary hyperparathyroidism of renal origin: Secondary | ICD-10-CM | POA: Diagnosis not present

## 2016-11-24 DIAGNOSIS — N186 End stage renal disease: Secondary | ICD-10-CM | POA: Diagnosis not present

## 2016-11-24 DIAGNOSIS — E1129 Type 2 diabetes mellitus with other diabetic kidney complication: Secondary | ICD-10-CM | POA: Diagnosis not present

## 2016-11-24 DIAGNOSIS — D509 Iron deficiency anemia, unspecified: Secondary | ICD-10-CM | POA: Diagnosis not present

## 2016-11-24 DIAGNOSIS — E8779 Other fluid overload: Secondary | ICD-10-CM | POA: Diagnosis not present

## 2016-11-26 DIAGNOSIS — Z992 Dependence on renal dialysis: Secondary | ICD-10-CM | POA: Diagnosis not present

## 2016-11-26 DIAGNOSIS — E1129 Type 2 diabetes mellitus with other diabetic kidney complication: Secondary | ICD-10-CM | POA: Diagnosis not present

## 2016-11-26 DIAGNOSIS — D509 Iron deficiency anemia, unspecified: Secondary | ICD-10-CM | POA: Diagnosis not present

## 2016-11-26 DIAGNOSIS — E8779 Other fluid overload: Secondary | ICD-10-CM | POA: Diagnosis not present

## 2016-11-26 DIAGNOSIS — N186 End stage renal disease: Secondary | ICD-10-CM | POA: Diagnosis not present

## 2016-11-26 DIAGNOSIS — N2581 Secondary hyperparathyroidism of renal origin: Secondary | ICD-10-CM | POA: Diagnosis not present

## 2016-11-26 DIAGNOSIS — D631 Anemia in chronic kidney disease: Secondary | ICD-10-CM | POA: Diagnosis not present

## 2016-11-29 DIAGNOSIS — D631 Anemia in chronic kidney disease: Secondary | ICD-10-CM | POA: Diagnosis not present

## 2016-11-29 DIAGNOSIS — N186 End stage renal disease: Secondary | ICD-10-CM | POA: Diagnosis not present

## 2016-11-29 DIAGNOSIS — N2581 Secondary hyperparathyroidism of renal origin: Secondary | ICD-10-CM | POA: Diagnosis not present

## 2016-11-29 DIAGNOSIS — D509 Iron deficiency anemia, unspecified: Secondary | ICD-10-CM | POA: Diagnosis not present

## 2016-11-29 DIAGNOSIS — E1129 Type 2 diabetes mellitus with other diabetic kidney complication: Secondary | ICD-10-CM | POA: Diagnosis not present

## 2016-12-01 ENCOUNTER — Other Ambulatory Visit (HOSPITAL_COMMUNITY): Payer: Self-pay | Admitting: Nephrology

## 2016-12-01 DIAGNOSIS — N2581 Secondary hyperparathyroidism of renal origin: Secondary | ICD-10-CM | POA: Diagnosis not present

## 2016-12-01 DIAGNOSIS — E1129 Type 2 diabetes mellitus with other diabetic kidney complication: Secondary | ICD-10-CM | POA: Diagnosis not present

## 2016-12-01 DIAGNOSIS — R918 Other nonspecific abnormal finding of lung field: Secondary | ICD-10-CM

## 2016-12-01 DIAGNOSIS — R222 Localized swelling, mass and lump, trunk: Secondary | ICD-10-CM

## 2016-12-01 DIAGNOSIS — D509 Iron deficiency anemia, unspecified: Secondary | ICD-10-CM | POA: Diagnosis not present

## 2016-12-01 DIAGNOSIS — D631 Anemia in chronic kidney disease: Secondary | ICD-10-CM | POA: Diagnosis not present

## 2016-12-01 DIAGNOSIS — N186 End stage renal disease: Secondary | ICD-10-CM | POA: Diagnosis not present

## 2016-12-03 DIAGNOSIS — E1129 Type 2 diabetes mellitus with other diabetic kidney complication: Secondary | ICD-10-CM | POA: Diagnosis not present

## 2016-12-03 DIAGNOSIS — D509 Iron deficiency anemia, unspecified: Secondary | ICD-10-CM | POA: Diagnosis not present

## 2016-12-03 DIAGNOSIS — D631 Anemia in chronic kidney disease: Secondary | ICD-10-CM | POA: Diagnosis not present

## 2016-12-03 DIAGNOSIS — N2581 Secondary hyperparathyroidism of renal origin: Secondary | ICD-10-CM | POA: Diagnosis not present

## 2016-12-03 DIAGNOSIS — N186 End stage renal disease: Secondary | ICD-10-CM | POA: Diagnosis not present

## 2016-12-06 DIAGNOSIS — D631 Anemia in chronic kidney disease: Secondary | ICD-10-CM | POA: Diagnosis not present

## 2016-12-06 DIAGNOSIS — N2581 Secondary hyperparathyroidism of renal origin: Secondary | ICD-10-CM | POA: Diagnosis not present

## 2016-12-06 DIAGNOSIS — E1129 Type 2 diabetes mellitus with other diabetic kidney complication: Secondary | ICD-10-CM | POA: Diagnosis not present

## 2016-12-06 DIAGNOSIS — N186 End stage renal disease: Secondary | ICD-10-CM | POA: Diagnosis not present

## 2016-12-06 DIAGNOSIS — D509 Iron deficiency anemia, unspecified: Secondary | ICD-10-CM | POA: Diagnosis not present

## 2016-12-07 ENCOUNTER — Other Ambulatory Visit (HOSPITAL_COMMUNITY): Payer: Self-pay | Admitting: Nephrology

## 2016-12-07 ENCOUNTER — Ambulatory Visit (HOSPITAL_COMMUNITY)
Admission: RE | Admit: 2016-12-07 | Discharge: 2016-12-07 | Disposition: A | Discharge status: Home / Self Care | Payer: Medicare Other | Source: Ambulatory Visit | Attending: Nephrology | Admitting: Nephrology

## 2016-12-07 DIAGNOSIS — K7689 Other specified diseases of liver: Secondary | ICD-10-CM | POA: Insufficient documentation

## 2016-12-07 DIAGNOSIS — J439 Emphysema, unspecified: Secondary | ICD-10-CM | POA: Diagnosis not present

## 2016-12-07 DIAGNOSIS — R918 Other nonspecific abnormal finding of lung field: Secondary | ICD-10-CM | POA: Insufficient documentation

## 2016-12-07 DIAGNOSIS — E079 Disorder of thyroid, unspecified: Secondary | ICD-10-CM | POA: Diagnosis not present

## 2016-12-08 DIAGNOSIS — N186 End stage renal disease: Secondary | ICD-10-CM | POA: Diagnosis not present

## 2016-12-08 DIAGNOSIS — E1129 Type 2 diabetes mellitus with other diabetic kidney complication: Secondary | ICD-10-CM | POA: Diagnosis not present

## 2016-12-08 DIAGNOSIS — D509 Iron deficiency anemia, unspecified: Secondary | ICD-10-CM | POA: Diagnosis not present

## 2016-12-08 DIAGNOSIS — N2581 Secondary hyperparathyroidism of renal origin: Secondary | ICD-10-CM | POA: Diagnosis not present

## 2016-12-08 DIAGNOSIS — D631 Anemia in chronic kidney disease: Secondary | ICD-10-CM | POA: Diagnosis not present

## 2016-12-10 DIAGNOSIS — N2581 Secondary hyperparathyroidism of renal origin: Secondary | ICD-10-CM | POA: Diagnosis not present

## 2016-12-10 DIAGNOSIS — D509 Iron deficiency anemia, unspecified: Secondary | ICD-10-CM | POA: Diagnosis not present

## 2016-12-10 DIAGNOSIS — D631 Anemia in chronic kidney disease: Secondary | ICD-10-CM | POA: Diagnosis not present

## 2016-12-10 DIAGNOSIS — N186 End stage renal disease: Secondary | ICD-10-CM | POA: Diagnosis not present

## 2016-12-10 DIAGNOSIS — E1129 Type 2 diabetes mellitus with other diabetic kidney complication: Secondary | ICD-10-CM | POA: Diagnosis not present

## 2016-12-13 DIAGNOSIS — D509 Iron deficiency anemia, unspecified: Secondary | ICD-10-CM | POA: Diagnosis not present

## 2016-12-13 DIAGNOSIS — D631 Anemia in chronic kidney disease: Secondary | ICD-10-CM | POA: Diagnosis not present

## 2016-12-13 DIAGNOSIS — E1129 Type 2 diabetes mellitus with other diabetic kidney complication: Secondary | ICD-10-CM | POA: Diagnosis not present

## 2016-12-13 DIAGNOSIS — N186 End stage renal disease: Secondary | ICD-10-CM | POA: Diagnosis not present

## 2016-12-13 DIAGNOSIS — N2581 Secondary hyperparathyroidism of renal origin: Secondary | ICD-10-CM | POA: Diagnosis not present

## 2016-12-15 DIAGNOSIS — N186 End stage renal disease: Secondary | ICD-10-CM | POA: Diagnosis not present

## 2016-12-15 DIAGNOSIS — D631 Anemia in chronic kidney disease: Secondary | ICD-10-CM | POA: Diagnosis not present

## 2016-12-15 DIAGNOSIS — N2581 Secondary hyperparathyroidism of renal origin: Secondary | ICD-10-CM | POA: Diagnosis not present

## 2016-12-15 DIAGNOSIS — D509 Iron deficiency anemia, unspecified: Secondary | ICD-10-CM | POA: Diagnosis not present

## 2016-12-15 DIAGNOSIS — E1129 Type 2 diabetes mellitus with other diabetic kidney complication: Secondary | ICD-10-CM | POA: Diagnosis not present

## 2016-12-17 DIAGNOSIS — D631 Anemia in chronic kidney disease: Secondary | ICD-10-CM | POA: Diagnosis not present

## 2016-12-17 DIAGNOSIS — D509 Iron deficiency anemia, unspecified: Secondary | ICD-10-CM | POA: Diagnosis not present

## 2016-12-17 DIAGNOSIS — E1129 Type 2 diabetes mellitus with other diabetic kidney complication: Secondary | ICD-10-CM | POA: Diagnosis not present

## 2016-12-17 DIAGNOSIS — N2581 Secondary hyperparathyroidism of renal origin: Secondary | ICD-10-CM | POA: Diagnosis not present

## 2016-12-17 DIAGNOSIS — N186 End stage renal disease: Secondary | ICD-10-CM | POA: Diagnosis not present

## 2016-12-19 ENCOUNTER — Other Ambulatory Visit (HOSPITAL_COMMUNITY): Payer: Self-pay | Admitting: Pulmonary Disease

## 2016-12-19 DIAGNOSIS — C649 Malignant neoplasm of unspecified kidney, except renal pelvis: Secondary | ICD-10-CM | POA: Diagnosis not present

## 2016-12-19 DIAGNOSIS — R938 Abnormal findings on diagnostic imaging of other specified body structures: Secondary | ICD-10-CM | POA: Diagnosis not present

## 2016-12-19 DIAGNOSIS — R918 Other nonspecific abnormal finding of lung field: Secondary | ICD-10-CM

## 2016-12-20 DIAGNOSIS — E1129 Type 2 diabetes mellitus with other diabetic kidney complication: Secondary | ICD-10-CM | POA: Diagnosis not present

## 2016-12-20 DIAGNOSIS — N2581 Secondary hyperparathyroidism of renal origin: Secondary | ICD-10-CM | POA: Diagnosis not present

## 2016-12-20 DIAGNOSIS — D509 Iron deficiency anemia, unspecified: Secondary | ICD-10-CM | POA: Diagnosis not present

## 2016-12-20 DIAGNOSIS — N186 End stage renal disease: Secondary | ICD-10-CM | POA: Diagnosis not present

## 2016-12-20 DIAGNOSIS — D631 Anemia in chronic kidney disease: Secondary | ICD-10-CM | POA: Diagnosis not present

## 2016-12-22 DIAGNOSIS — N2581 Secondary hyperparathyroidism of renal origin: Secondary | ICD-10-CM | POA: Diagnosis not present

## 2016-12-22 DIAGNOSIS — D509 Iron deficiency anemia, unspecified: Secondary | ICD-10-CM | POA: Diagnosis not present

## 2016-12-22 DIAGNOSIS — N186 End stage renal disease: Secondary | ICD-10-CM | POA: Diagnosis not present

## 2016-12-22 DIAGNOSIS — E1129 Type 2 diabetes mellitus with other diabetic kidney complication: Secondary | ICD-10-CM | POA: Diagnosis not present

## 2016-12-22 DIAGNOSIS — D631 Anemia in chronic kidney disease: Secondary | ICD-10-CM | POA: Diagnosis not present

## 2016-12-24 DIAGNOSIS — N2581 Secondary hyperparathyroidism of renal origin: Secondary | ICD-10-CM | POA: Diagnosis not present

## 2016-12-24 DIAGNOSIS — D631 Anemia in chronic kidney disease: Secondary | ICD-10-CM | POA: Diagnosis not present

## 2016-12-24 DIAGNOSIS — N186 End stage renal disease: Secondary | ICD-10-CM | POA: Diagnosis not present

## 2016-12-24 DIAGNOSIS — E1129 Type 2 diabetes mellitus with other diabetic kidney complication: Secondary | ICD-10-CM | POA: Diagnosis not present

## 2016-12-24 DIAGNOSIS — D509 Iron deficiency anemia, unspecified: Secondary | ICD-10-CM | POA: Diagnosis not present

## 2016-12-27 DIAGNOSIS — N186 End stage renal disease: Secondary | ICD-10-CM | POA: Diagnosis not present

## 2016-12-27 DIAGNOSIS — Z992 Dependence on renal dialysis: Secondary | ICD-10-CM | POA: Diagnosis not present

## 2016-12-27 DIAGNOSIS — E1129 Type 2 diabetes mellitus with other diabetic kidney complication: Secondary | ICD-10-CM | POA: Diagnosis not present

## 2016-12-27 DIAGNOSIS — N2581 Secondary hyperparathyroidism of renal origin: Secondary | ICD-10-CM | POA: Diagnosis not present

## 2016-12-27 DIAGNOSIS — D631 Anemia in chronic kidney disease: Secondary | ICD-10-CM | POA: Diagnosis not present

## 2016-12-27 DIAGNOSIS — D509 Iron deficiency anemia, unspecified: Secondary | ICD-10-CM | POA: Diagnosis not present

## 2016-12-29 DIAGNOSIS — N186 End stage renal disease: Secondary | ICD-10-CM | POA: Diagnosis not present

## 2016-12-29 DIAGNOSIS — D509 Iron deficiency anemia, unspecified: Secondary | ICD-10-CM | POA: Diagnosis not present

## 2016-12-29 DIAGNOSIS — N2581 Secondary hyperparathyroidism of renal origin: Secondary | ICD-10-CM | POA: Diagnosis not present

## 2016-12-29 DIAGNOSIS — D631 Anemia in chronic kidney disease: Secondary | ICD-10-CM | POA: Diagnosis not present

## 2016-12-29 DIAGNOSIS — E1129 Type 2 diabetes mellitus with other diabetic kidney complication: Secondary | ICD-10-CM | POA: Diagnosis not present

## 2016-12-30 ENCOUNTER — Encounter (HOSPITAL_COMMUNITY)
Admission: RE | Admit: 2016-12-30 | Discharge: 2016-12-30 | Disposition: A | Discharge status: Home / Self Care | Payer: Medicare Other | Source: Ambulatory Visit | Attending: Pulmonary Disease | Admitting: Pulmonary Disease

## 2016-12-30 DIAGNOSIS — C3490 Malignant neoplasm of unspecified part of unspecified bronchus or lung: Secondary | ICD-10-CM | POA: Diagnosis not present

## 2016-12-30 DIAGNOSIS — R918 Other nonspecific abnormal finding of lung field: Secondary | ICD-10-CM | POA: Diagnosis not present

## 2016-12-30 LAB — GLUCOSE, CAPILLARY: Glucose-Capillary: 93 mg/dL (ref 65–99)

## 2016-12-30 MED ORDER — FLUDEOXYGLUCOSE F - 18 (FDG) INJECTION
8.0000 | Freq: Once | INTRAVENOUS | Status: AC | PRN
  Administered 2016-12-30: 8 via INTRAVENOUS

## 2016-12-31 DIAGNOSIS — N2581 Secondary hyperparathyroidism of renal origin: Secondary | ICD-10-CM | POA: Diagnosis not present

## 2016-12-31 DIAGNOSIS — D631 Anemia in chronic kidney disease: Secondary | ICD-10-CM | POA: Diagnosis not present

## 2016-12-31 DIAGNOSIS — E1129 Type 2 diabetes mellitus with other diabetic kidney complication: Secondary | ICD-10-CM | POA: Diagnosis not present

## 2016-12-31 DIAGNOSIS — D509 Iron deficiency anemia, unspecified: Secondary | ICD-10-CM | POA: Diagnosis not present

## 2016-12-31 DIAGNOSIS — N186 End stage renal disease: Secondary | ICD-10-CM | POA: Diagnosis not present

## 2017-01-03 DIAGNOSIS — N2581 Secondary hyperparathyroidism of renal origin: Secondary | ICD-10-CM | POA: Diagnosis not present

## 2017-01-03 DIAGNOSIS — E1129 Type 2 diabetes mellitus with other diabetic kidney complication: Secondary | ICD-10-CM | POA: Diagnosis not present

## 2017-01-03 DIAGNOSIS — D631 Anemia in chronic kidney disease: Secondary | ICD-10-CM | POA: Diagnosis not present

## 2017-01-03 DIAGNOSIS — N186 End stage renal disease: Secondary | ICD-10-CM | POA: Diagnosis not present

## 2017-01-03 DIAGNOSIS — D509 Iron deficiency anemia, unspecified: Secondary | ICD-10-CM | POA: Diagnosis not present

## 2017-01-04 ENCOUNTER — Other Ambulatory Visit (HOSPITAL_COMMUNITY): Payer: Self-pay | Admitting: Pulmonary Disease

## 2017-01-04 DIAGNOSIS — R160 Hepatomegaly, not elsewhere classified: Secondary | ICD-10-CM

## 2017-01-05 DIAGNOSIS — N186 End stage renal disease: Secondary | ICD-10-CM | POA: Diagnosis not present

## 2017-01-05 DIAGNOSIS — N2581 Secondary hyperparathyroidism of renal origin: Secondary | ICD-10-CM | POA: Diagnosis not present

## 2017-01-05 DIAGNOSIS — D509 Iron deficiency anemia, unspecified: Secondary | ICD-10-CM | POA: Diagnosis not present

## 2017-01-05 DIAGNOSIS — D631 Anemia in chronic kidney disease: Secondary | ICD-10-CM | POA: Diagnosis not present

## 2017-01-05 DIAGNOSIS — E1129 Type 2 diabetes mellitus with other diabetic kidney complication: Secondary | ICD-10-CM | POA: Diagnosis not present

## 2017-01-07 DIAGNOSIS — E1129 Type 2 diabetes mellitus with other diabetic kidney complication: Secondary | ICD-10-CM | POA: Diagnosis not present

## 2017-01-07 DIAGNOSIS — N2581 Secondary hyperparathyroidism of renal origin: Secondary | ICD-10-CM | POA: Diagnosis not present

## 2017-01-07 DIAGNOSIS — N186 End stage renal disease: Secondary | ICD-10-CM | POA: Diagnosis not present

## 2017-01-07 DIAGNOSIS — D509 Iron deficiency anemia, unspecified: Secondary | ICD-10-CM | POA: Diagnosis not present

## 2017-01-07 DIAGNOSIS — D631 Anemia in chronic kidney disease: Secondary | ICD-10-CM | POA: Diagnosis not present

## 2017-01-10 DIAGNOSIS — N186 End stage renal disease: Secondary | ICD-10-CM | POA: Diagnosis not present

## 2017-01-10 DIAGNOSIS — N2581 Secondary hyperparathyroidism of renal origin: Secondary | ICD-10-CM | POA: Diagnosis not present

## 2017-01-10 DIAGNOSIS — D631 Anemia in chronic kidney disease: Secondary | ICD-10-CM | POA: Diagnosis not present

## 2017-01-10 DIAGNOSIS — E1129 Type 2 diabetes mellitus with other diabetic kidney complication: Secondary | ICD-10-CM | POA: Diagnosis not present

## 2017-01-10 DIAGNOSIS — D509 Iron deficiency anemia, unspecified: Secondary | ICD-10-CM | POA: Diagnosis not present

## 2017-01-11 DIAGNOSIS — D631 Anemia in chronic kidney disease: Secondary | ICD-10-CM | POA: Diagnosis not present

## 2017-01-11 DIAGNOSIS — D509 Iron deficiency anemia, unspecified: Secondary | ICD-10-CM | POA: Diagnosis not present

## 2017-01-11 DIAGNOSIS — N186 End stage renal disease: Secondary | ICD-10-CM | POA: Diagnosis not present

## 2017-01-11 DIAGNOSIS — E1129 Type 2 diabetes mellitus with other diabetic kidney complication: Secondary | ICD-10-CM | POA: Diagnosis not present

## 2017-01-11 DIAGNOSIS — N2581 Secondary hyperparathyroidism of renal origin: Secondary | ICD-10-CM | POA: Diagnosis not present

## 2017-01-12 ENCOUNTER — Ambulatory Visit (HOSPITAL_COMMUNITY): Admission: RE | Admit: 2017-01-12 | Payer: Medicare Other | Source: Ambulatory Visit

## 2017-01-14 DIAGNOSIS — D631 Anemia in chronic kidney disease: Secondary | ICD-10-CM | POA: Diagnosis not present

## 2017-01-14 DIAGNOSIS — N2581 Secondary hyperparathyroidism of renal origin: Secondary | ICD-10-CM | POA: Diagnosis not present

## 2017-01-14 DIAGNOSIS — N186 End stage renal disease: Secondary | ICD-10-CM | POA: Diagnosis not present

## 2017-01-14 DIAGNOSIS — D509 Iron deficiency anemia, unspecified: Secondary | ICD-10-CM | POA: Diagnosis not present

## 2017-01-14 DIAGNOSIS — E1129 Type 2 diabetes mellitus with other diabetic kidney complication: Secondary | ICD-10-CM | POA: Diagnosis not present

## 2017-01-17 ENCOUNTER — Other Ambulatory Visit: Payer: Self-pay | Admitting: Radiology

## 2017-01-17 DIAGNOSIS — N2581 Secondary hyperparathyroidism of renal origin: Secondary | ICD-10-CM | POA: Diagnosis not present

## 2017-01-17 DIAGNOSIS — E1129 Type 2 diabetes mellitus with other diabetic kidney complication: Secondary | ICD-10-CM | POA: Diagnosis not present

## 2017-01-17 DIAGNOSIS — D631 Anemia in chronic kidney disease: Secondary | ICD-10-CM | POA: Diagnosis not present

## 2017-01-17 DIAGNOSIS — N186 End stage renal disease: Secondary | ICD-10-CM | POA: Diagnosis not present

## 2017-01-17 DIAGNOSIS — D509 Iron deficiency anemia, unspecified: Secondary | ICD-10-CM | POA: Diagnosis not present

## 2017-01-18 ENCOUNTER — Encounter (HOSPITAL_COMMUNITY): Payer: Self-pay

## 2017-01-18 ENCOUNTER — Ambulatory Visit (HOSPITAL_COMMUNITY)
Admission: RE | Admit: 2017-01-18 | Discharge: 2017-01-18 | Disposition: A | Discharge status: Home / Self Care | Payer: Medicare Other | Source: Ambulatory Visit | Attending: Pulmonary Disease | Admitting: Pulmonary Disease

## 2017-01-18 DIAGNOSIS — R918 Other nonspecific abnormal finding of lung field: Secondary | ICD-10-CM | POA: Insufficient documentation

## 2017-01-18 DIAGNOSIS — Z992 Dependence on renal dialysis: Secondary | ICD-10-CM | POA: Diagnosis not present

## 2017-01-18 DIAGNOSIS — C801 Malignant (primary) neoplasm, unspecified: Secondary | ICD-10-CM | POA: Diagnosis not present

## 2017-01-18 DIAGNOSIS — E1151 Type 2 diabetes mellitus with diabetic peripheral angiopathy without gangrene: Secondary | ICD-10-CM | POA: Diagnosis not present

## 2017-01-18 DIAGNOSIS — Z9049 Acquired absence of other specified parts of digestive tract: Secondary | ICD-10-CM | POA: Diagnosis not present

## 2017-01-18 DIAGNOSIS — Z87891 Personal history of nicotine dependence: Secondary | ICD-10-CM | POA: Insufficient documentation

## 2017-01-18 DIAGNOSIS — E039 Hypothyroidism, unspecified: Secondary | ICD-10-CM | POA: Diagnosis not present

## 2017-01-18 DIAGNOSIS — C787 Secondary malignant neoplasm of liver and intrahepatic bile duct: Secondary | ICD-10-CM | POA: Diagnosis not present

## 2017-01-18 DIAGNOSIS — I120 Hypertensive chronic kidney disease with stage 5 chronic kidney disease or end stage renal disease: Secondary | ICD-10-CM | POA: Diagnosis not present

## 2017-01-18 DIAGNOSIS — J449 Chronic obstructive pulmonary disease, unspecified: Secondary | ICD-10-CM | POA: Insufficient documentation

## 2017-01-18 DIAGNOSIS — E1122 Type 2 diabetes mellitus with diabetic chronic kidney disease: Secondary | ICD-10-CM | POA: Insufficient documentation

## 2017-01-18 DIAGNOSIS — Z8673 Personal history of transient ischemic attack (TIA), and cerebral infarction without residual deficits: Secondary | ICD-10-CM | POA: Insufficient documentation

## 2017-01-18 DIAGNOSIS — R160 Hepatomegaly, not elsewhere classified: Secondary | ICD-10-CM | POA: Insufficient documentation

## 2017-01-18 DIAGNOSIS — Z89512 Acquired absence of left leg below knee: Secondary | ICD-10-CM | POA: Insufficient documentation

## 2017-01-18 DIAGNOSIS — E785 Hyperlipidemia, unspecified: Secondary | ICD-10-CM | POA: Insufficient documentation

## 2017-01-18 DIAGNOSIS — K769 Liver disease, unspecified: Secondary | ICD-10-CM | POA: Diagnosis present

## 2017-01-18 DIAGNOSIS — N186 End stage renal disease: Secondary | ICD-10-CM | POA: Insufficient documentation

## 2017-01-18 DIAGNOSIS — K7689 Other specified diseases of liver: Secondary | ICD-10-CM | POA: Diagnosis not present

## 2017-01-18 LAB — APTT: aPTT: 34 seconds (ref 24–36)

## 2017-01-18 LAB — PROTIME-INR
INR: 1.1
PROTHROMBIN TIME: 14.3 s (ref 11.4–15.2)

## 2017-01-18 LAB — CBC
HCT: 33.7 % — ABNORMAL LOW (ref 36.0–46.0)
Hemoglobin: 10.1 g/dL — ABNORMAL LOW (ref 12.0–15.0)
MCH: 25.4 pg — AB (ref 26.0–34.0)
MCHC: 30 g/dL (ref 30.0–36.0)
MCV: 84.9 fL (ref 78.0–100.0)
PLATELETS: 246 10*3/uL (ref 150–400)
RBC: 3.97 MIL/uL (ref 3.87–5.11)
RDW: 16.8 % — ABNORMAL HIGH (ref 11.5–15.5)
WBC: 9.9 10*3/uL (ref 4.0–10.5)

## 2017-01-18 LAB — GLUCOSE, CAPILLARY: Glucose-Capillary: 95 mg/dL (ref 65–99)

## 2017-01-18 MED ORDER — SODIUM CHLORIDE 0.9 % IV SOLN: INTRAVENOUS | Status: DC

## 2017-01-18 MED ORDER — LIDOCAINE HCL (PF) 1 % IJ SOLN
INTRAMUSCULAR | Status: AC
  Filled 2017-01-18: qty 30

## 2017-01-18 MED ORDER — FENTANYL CITRATE (PF) 100 MCG/2ML IJ SOLN
INTRAMUSCULAR | Status: AC | PRN
  Administered 2017-01-18 (×2): 25 ug via INTRAVENOUS

## 2017-01-18 MED ORDER — MIDAZOLAM HCL 2 MG/2ML IJ SOLN
INTRAMUSCULAR | Status: AC | PRN
  Administered 2017-01-18: 1 mg via INTRAVENOUS

## 2017-01-18 MED ORDER — FENTANYL CITRATE (PF) 100 MCG/2ML IJ SOLN
INTRAMUSCULAR | Status: AC
  Filled 2017-01-18: qty 2

## 2017-01-18 MED ORDER — SODIUM CHLORIDE 0.9 % IV SOLN
INTRAVENOUS | Status: AC | PRN
  Administered 2017-01-18: 10 mL/h via INTRAVENOUS

## 2017-01-18 MED ORDER — MIDAZOLAM HCL 2 MG/2ML IJ SOLN
INTRAMUSCULAR | Status: AC
  Filled 2017-01-18: qty 2

## 2017-01-18 MED ORDER — GELATIN ABSORBABLE 12-7 MM EX MISC
CUTANEOUS | Status: AC
  Filled 2017-01-18: qty 1

## 2017-01-18 NOTE — H&P (Signed)
Chief Complaint: liver mass  Referring Physician:Dr. Sinda Du  Supervising Physician: Marybelle Killings  Patient Status: [facility] - Out-pt  HPI: [name] is a 71 y.o. female who has been found to have a left upper lobe lung mass on a CT scan in July.   She was referred to Dr. Luan Pulling who has then ordered a PET scan.  This revealed diffuse liver mets along with a hypermetabolic mass in the left lung.  She has been referred to IR for a biopsy of one of the liver lesions to confirm a diagnosis.    Past Medical History:  Past Medical History:  Diagnosis Date  . Arthritis    knee  . Bronchitis   . COPD (chronic obstructive pulmonary disease) ([place])   . Diabetes mellitus   . Diarrhea    not constant- frequent  . ESRD on hemodialysis ([place])    [facility] [place] [facility]. Started HD November 26, 2009. 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  Marland Kitchen Family history of anesthesia complication    SON- VOMITS  . Hyperlipidemia   . Hypertension   . Hypothyroidism   . Leg pain   . Peripheral vascular disease ([place])   . 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 Retroperitoneal bleeding 06/2016  . Stroke ([place])   . Thyroid disease     Past Surgical History:  Past Surgical History:  Procedure Laterality Date  . AMPUTATION Left 07/25/2012   Procedure: LEFT AMPUTATION BELOW KNEE;  Surgeon: Newt Minion, MD;  Location: [facility];  Service: Orthopedics;  Laterality: Left;  Left Below Knee Amputation  . AV FISTULA PLACEMENT  05/18/2010  . BASCILIC VEIN TRANSPOSITION Right 11/23/2012   Procedure: RIGHT BASCILIC VEIN TRANSPOSITION ;  Surgeon: Angelia Mould, MD;  Location: [facility];  Service: Vascular;  Laterality: Right;  . CHOLECYSTECTOMY    . DG AV DIALYSIS SHUNT ACCESS EXIST*R* OR     working right HD catheter  . FEMUR IM NAIL  05/20/2012   Procedure: INTRAMEDULLARY (IM) RETROGRADE FEMORAL NAILING;  Surgeon: Rozanna Box, MD;  Location: [facility];  Service: Orthopedics;   Laterality: Left;  . IR GENERIC HISTORICAL  07/07/2016   IR US GUIDE VASC ACCESS LEFT 07/07/2016 Markus Daft, MD [facility]-INTERV RAD  . IR GENERIC HISTORICAL  07/07/2016   IR FLUORO GUIDE CV LINE LEFT 07/07/2016 Markus Daft, MD [facility]-INTERV RAD    Family History:  Family History  Problem Relation Age of Onset  . Diabetes Mother     Social History:  reports that she quit smoking about 4 years ago. Her smoking use included Cigarettes. She has a 15.00 pack-year smoking history. She has never used smokeless tobacco. She reports that she does not drink alcohol or use drugs.  Allergies:  Allergies  Allergen Reactions  . Contrast Media [Iodinated Diagnostic Agents] Itching  . Latex Itching    Medications: Medications reviewed in epic  Please HPI for pertinent positives, otherwise complete 10 system ROS negative.  Mallampati Score: MD Evaluation Airway: WNL Heart: WNL Abdomen: WNL Chest/ Lungs: WNL ASA  Classification: 3 Mallampati/Airway Score: Three  Physical Exam: BP (!) 159/59   Pulse 79   Temp 98.5 F (36.9 C) (Oral)   Resp 18   Ht 5\' 3"  (1.6 m)   Wt 163 lb (73.9 kg)   SpO2 99%   BMI 28.87 kg/m  Body mass index is 28.87 kg/m. General: pleasant, WD, WN black female who  is laying in bed in NAD HEENT: head is normocephalic, atraumatic.  Sclera are noninjected.  PERRL.  Ears and nose without any masses or lesions.  Mouth is pink and moist Heart: regular, rate, and rhythm.  Normal s1,s2. No obvious murmurs, gallops, or rubs noted.  Palpable radial pulses bilaterally Lungs: CTAB, no wheezes, rhonchi, or rales noted.  Respiratory effort nonlabored Abd: soft, NT, ND, +BS, no masses, hernias, or organomegaly Psych: A&Ox3 with an appropriate affect.   Labs: Results for orders placed or performed during the hospital encounter of 01/18/17 (from the past 48 hour(s))  Glucose, capillary     Status: None   Collection Time: 01/18/17  7:21 AM  Result Value Ref Range   Glucose-Capillary 95 65 - 99  mg/dL  APTT upon arrival     Status: None   Collection Time: 01/18/17  7:33 AM  Result Value Ref Range   aPTT 34 24 - 36 seconds  CBC upon arrival     Status: Abnormal   Collection Time: 01/18/17  7:33 AM  Result Value Ref Range   WBC 9.9 4.0 - 10.5 K/uL   RBC 3.97 3.87 - 5.11 MIL/uL   Hemoglobin 10.1 (L) 12.0 - 15.0 g/dL   HCT 33.7 (L) 36.0 - 46.0 %   MCV 84.9 78.0 - 100.0 fL   MCH 25.4 (L) 26.0 - 34.0 pg   MCHC 30.0 30.0 - 36.0 g/dL   RDW 16.8 (H) 11.5 - 15.5 %   Platelets 246 150 - 400 K/uL  Protime-INR upon arrival     Status: None   Collection Time: 01/18/17  7:33 AM  Result Value Ref Range   Prothrombin Time 14.3 11.4 - 15.2 seconds   INR 1.10     Imaging: No results found.  Assessment/Plan 1. Liver lesion  We will proceed today with a liver lesion biopsy to help determine an etiology, which is likely lung cancer.  Her labs and vitals have been reviewed.  Risks and benefits discussed with the patient including, but not limited to bleeding, infection, damage to adjacent structures or low yield requiring additional tests. All of the patient's questions were answered, patient is agreeable to proceed. Consent signed and in chart.  Thank you for this interesting consult.  I greatly enjoyed meeting [name] and look forward to participating in their care.  A copy of this report was sent to the requesting provider on this date.  Electronically Signed: Henreitta Cea 01/18/2017, 7:59 AM   I spent a total of  30 Minutes   in face to face in clinical consultation, greater than 50% of which was counseling/coordinating care for liver lesion

## 2017-01-18 NOTE — Procedures (Signed)
L lobe liver lesion Bx 18 g times two EBL 0 Comp 0

## 2017-01-18 NOTE — Discharge Instructions (Addendum)
Liver Biopsy, Care After °These instructions give you information on caring for yourself after your procedure. Your doctor may also give you more specific instructions. Call your doctor if you have any problems or questions after your procedure. °Follow these instructions at home: °· Rest at home for 1-2 days or as told by your doctor. °· Have someone stay with you for at least 24 hours. °· Do not do these things in the first 24 hours: °? Drive. °? Use machinery. °? Take care of other people. °? Sign legal documents. °? Take a bath or shower. °· There are many different ways to close and cover a cut (incision). For example, a cut can be closed with stitches, skin glue, or adhesive strips. Follow your doctor's instructions on: °? Taking care of your cut. °? Changing and removing your bandage (dressing). °? Removing whatever was used to close your cut. °· Do not drink alcohol in the first week. °· Do not lift more than 5 pounds or play contact sports for the first 2 weeks. °· Take medicines only as told by your doctor. For 1 week, do not take medicine that has aspirin in it or medicines like ibuprofen. °· Get your test results. °Contact a doctor if: °· A cut bleeds and leaves more than just a small spot of blood. °· A cut is red, puffs up (swells), or hurts more than before. °· Fluid or something else comes from a cut. °· A cut smells bad. °· You have a fever or chills. °Get help right away if: °· You have swelling, bloating, or pain in your belly (abdomen). °· You get dizzy or faint. °· You have a rash. °· You feel sick to your stomach (nauseous) or throw up (vomit). °· You have trouble breathing, feel short of breath, or feel faint. °· Your chest hurts. °· You have problems talking or seeing. °· You have trouble balancing or moving your arms or legs. °This information is not intended to replace advice given to you by your health care provider. Make sure you discuss any questions you have with your health care  provider. °Document Released: 02/23/2008 Document Revised: 10/22/2015 Document Reviewed: 07/12/2013 °Elsevier Interactive Patient Education © 2018 Elsevier Inc. ° ° ° ° °Moderate Conscious Sedation, Adult, Care After °These instructions provide you with information about caring for yourself after your procedure. Your health care provider may also give you more specific instructions. Your treatment has been planned according to current medical practices, but problems sometimes occur. Call your health care provider if you have any problems or questions after your procedure. °What can I expect after the procedure? °After your procedure, it is common: °· To feel sleepy for several hours. °· To feel clumsy and have poor balance for several hours. °· To have poor judgment for several hours. °· To vomit if you eat too soon. ° °Follow these instructions at home: °For at least 24 hours after the procedure: ° °· Do not: °? Participate in activities where you could fall or become injured. °? Drive. °? Use heavy machinery. °? Drink alcohol. °? Take sleeping pills or medicines that cause drowsiness. °? Make important decisions or sign legal documents. °? Take care of children on your own. °· Rest. °Eating and drinking °· Follow the diet recommended by your health care provider. °· If you vomit: °? Drink water, juice, or soup when you can drink without vomiting. °? Make sure you have little or no nausea before eating solid foods. °General instructions °·   Have a responsible adult stay with you until you are awake and alert. °· Take over-the-counter and prescription medicines only as told by your health care provider. °· If you smoke, do not smoke without supervision. °· Keep all follow-up visits as told by your health care provider. This is important. °Contact a health care provider if: °· You keep feeling nauseous or you keep vomiting. °· You feel light-headed. °· You develop a rash. °· You have a fever. °Get help right away  if: °· You have trouble breathing. °This information is not intended to replace advice given to you by your health care provider. Make sure you discuss any questions you have with your health care provider. °Document Released: 03/06/2013 Document Revised: 10/19/2015 Document Reviewed: 09/05/2015 °Elsevier Interactive Patient Education © 2018 Elsevier Inc. ° ° °

## 2017-01-19 DIAGNOSIS — N2581 Secondary hyperparathyroidism of renal origin: Secondary | ICD-10-CM | POA: Diagnosis not present

## 2017-01-19 DIAGNOSIS — D631 Anemia in chronic kidney disease: Secondary | ICD-10-CM | POA: Diagnosis not present

## 2017-01-19 DIAGNOSIS — E1129 Type 2 diabetes mellitus with other diabetic kidney complication: Secondary | ICD-10-CM | POA: Diagnosis not present

## 2017-01-19 DIAGNOSIS — D509 Iron deficiency anemia, unspecified: Secondary | ICD-10-CM | POA: Diagnosis not present

## 2017-01-19 DIAGNOSIS — N186 End stage renal disease: Secondary | ICD-10-CM | POA: Diagnosis not present

## 2017-01-21 DIAGNOSIS — N186 End stage renal disease: Secondary | ICD-10-CM | POA: Diagnosis not present

## 2017-01-21 DIAGNOSIS — E1129 Type 2 diabetes mellitus with other diabetic kidney complication: Secondary | ICD-10-CM | POA: Diagnosis not present

## 2017-01-21 DIAGNOSIS — D631 Anemia in chronic kidney disease: Secondary | ICD-10-CM | POA: Diagnosis not present

## 2017-01-21 DIAGNOSIS — N2581 Secondary hyperparathyroidism of renal origin: Secondary | ICD-10-CM | POA: Diagnosis not present

## 2017-01-21 DIAGNOSIS — D509 Iron deficiency anemia, unspecified: Secondary | ICD-10-CM | POA: Diagnosis not present

## 2017-01-24 DIAGNOSIS — D631 Anemia in chronic kidney disease: Secondary | ICD-10-CM | POA: Diagnosis not present

## 2017-01-24 DIAGNOSIS — N186 End stage renal disease: Secondary | ICD-10-CM | POA: Diagnosis not present

## 2017-01-24 DIAGNOSIS — D509 Iron deficiency anemia, unspecified: Secondary | ICD-10-CM | POA: Diagnosis not present

## 2017-01-24 DIAGNOSIS — N2581 Secondary hyperparathyroidism of renal origin: Secondary | ICD-10-CM | POA: Diagnosis not present

## 2017-01-24 DIAGNOSIS — E1129 Type 2 diabetes mellitus with other diabetic kidney complication: Secondary | ICD-10-CM | POA: Diagnosis not present

## 2017-01-25 DIAGNOSIS — C787 Secondary malignant neoplasm of liver and intrahepatic bile duct: Secondary | ICD-10-CM | POA: Diagnosis not present

## 2017-01-25 DIAGNOSIS — R918 Other nonspecific abnormal finding of lung field: Secondary | ICD-10-CM | POA: Diagnosis not present

## 2017-01-25 DIAGNOSIS — E1140 Type 2 diabetes mellitus with diabetic neuropathy, unspecified: Secondary | ICD-10-CM | POA: Diagnosis not present

## 2017-01-25 DIAGNOSIS — N186 End stage renal disease: Secondary | ICD-10-CM | POA: Diagnosis not present

## 2017-01-25 DIAGNOSIS — J9611 Chronic respiratory failure with hypoxia: Secondary | ICD-10-CM | POA: Diagnosis not present

## 2017-01-26 DIAGNOSIS — D631 Anemia in chronic kidney disease: Secondary | ICD-10-CM | POA: Diagnosis not present

## 2017-01-26 DIAGNOSIS — N186 End stage renal disease: Secondary | ICD-10-CM | POA: Diagnosis not present

## 2017-01-26 DIAGNOSIS — D509 Iron deficiency anemia, unspecified: Secondary | ICD-10-CM | POA: Diagnosis not present

## 2017-01-26 DIAGNOSIS — N2581 Secondary hyperparathyroidism of renal origin: Secondary | ICD-10-CM | POA: Diagnosis not present

## 2017-01-26 DIAGNOSIS — E1129 Type 2 diabetes mellitus with other diabetic kidney complication: Secondary | ICD-10-CM | POA: Diagnosis not present

## 2017-01-27 DIAGNOSIS — Z992 Dependence on renal dialysis: Secondary | ICD-10-CM | POA: Diagnosis not present

## 2017-01-27 DIAGNOSIS — N186 End stage renal disease: Secondary | ICD-10-CM | POA: Diagnosis not present

## 2017-01-27 DIAGNOSIS — E1129 Type 2 diabetes mellitus with other diabetic kidney complication: Secondary | ICD-10-CM | POA: Diagnosis not present

## 2017-01-28 DIAGNOSIS — D631 Anemia in chronic kidney disease: Secondary | ICD-10-CM | POA: Diagnosis not present

## 2017-01-28 DIAGNOSIS — Z992 Dependence on renal dialysis: Secondary | ICD-10-CM | POA: Diagnosis not present

## 2017-01-28 DIAGNOSIS — Z23 Encounter for immunization: Secondary | ICD-10-CM | POA: Diagnosis not present

## 2017-01-28 DIAGNOSIS — N2581 Secondary hyperparathyroidism of renal origin: Secondary | ICD-10-CM | POA: Diagnosis not present

## 2017-01-28 DIAGNOSIS — N186 End stage renal disease: Secondary | ICD-10-CM | POA: Diagnosis not present

## 2017-01-28 DIAGNOSIS — D509 Iron deficiency anemia, unspecified: Secondary | ICD-10-CM | POA: Diagnosis not present

## 2017-01-28 DIAGNOSIS — E1129 Type 2 diabetes mellitus with other diabetic kidney complication: Secondary | ICD-10-CM | POA: Diagnosis not present

## 2017-01-31 DIAGNOSIS — D509 Iron deficiency anemia, unspecified: Secondary | ICD-10-CM | POA: Diagnosis not present

## 2017-01-31 DIAGNOSIS — N186 End stage renal disease: Secondary | ICD-10-CM | POA: Diagnosis not present

## 2017-01-31 DIAGNOSIS — Z23 Encounter for immunization: Secondary | ICD-10-CM | POA: Diagnosis not present

## 2017-01-31 DIAGNOSIS — D631 Anemia in chronic kidney disease: Secondary | ICD-10-CM | POA: Diagnosis not present

## 2017-01-31 DIAGNOSIS — E1129 Type 2 diabetes mellitus with other diabetic kidney complication: Secondary | ICD-10-CM | POA: Diagnosis not present

## 2017-01-31 DIAGNOSIS — N2581 Secondary hyperparathyroidism of renal origin: Secondary | ICD-10-CM | POA: Diagnosis not present

## 2017-02-01 ENCOUNTER — Telehealth: Payer: Self-pay | Admitting: *Deleted

## 2017-02-01 ENCOUNTER — Encounter (HOSPITAL_COMMUNITY): Payer: Medicare Other | Attending: Oncology | Admitting: Oncology

## 2017-02-01 ENCOUNTER — Encounter (HOSPITAL_COMMUNITY): Payer: Self-pay | Admitting: Oncology

## 2017-02-01 VITALS — BP 126/40 | HR 76 | Resp 16 | Ht 63.0 in | Wt 173.0 lb

## 2017-02-01 DIAGNOSIS — C787 Secondary malignant neoplasm of liver and intrahepatic bile duct: Secondary | ICD-10-CM | POA: Diagnosis not present

## 2017-02-01 DIAGNOSIS — C3490 Malignant neoplasm of unspecified part of unspecified bronchus or lung: Secondary | ICD-10-CM | POA: Insufficient documentation

## 2017-02-01 DIAGNOSIS — C3412 Malignant neoplasm of upper lobe, left bronchus or lung: Secondary | ICD-10-CM | POA: Diagnosis not present

## 2017-02-01 DIAGNOSIS — C3492 Malignant neoplasm of unspecified part of left bronchus or lung: Secondary | ICD-10-CM

## 2017-02-01 DIAGNOSIS — C772 Secondary and unspecified malignant neoplasm of intra-abdominal lymph nodes: Secondary | ICD-10-CM

## 2017-02-01 MED ORDER — PROCHLORPERAZINE MALEATE 10 MG PO TABS: 10.0000 mg | ORAL_TABLET | Freq: Four times a day (QID) | ORAL | 1 refills | Status: DC | PRN

## 2017-02-01 MED ORDER — DEXAMETHASONE 4 MG PO TABS: 8.0000 mg | ORAL_TABLET | Freq: Two times a day (BID) | ORAL | 1 refills | Status: DC

## 2017-02-01 MED ORDER — LIDOCAINE-PRILOCAINE 2.5-2.5 % EX CREA: TOPICAL_CREAM | CUTANEOUS | 3 refills | Status: DC

## 2017-02-01 MED ORDER — DEXAMETHASONE 4 MG PO TABS: 8.0000 mg | ORAL_TABLET | Freq: Every day | ORAL | 1 refills | Status: DC

## 2017-02-01 NOTE — Telephone Encounter (Signed)
[name] Sat[facility] call rolled over to [facility] in [place], transferred to Triage.   "[facility] calling for clarification of which Dexamethasone order to use for this patient."  Only one dexamethasone order on medication list per EPIC shared with pharmacist.    "No.  These two orders are the same dose, quantity, one refill.  Difference is one order reads before Taxotere and take three days after chemotherapy, the other order reads take starting the day after chemotherapy.  Insurance will not allow both."  Checked EPIC order review of "PAT orders" for today's orders.  Initial order at 1419 for 8 mg dexamethasone, beginning before Taxotere and then three days after was discontinued by ordering provider.  Second Dexamethasone order sent and received today at 1420 is current EMR order per EPIC.  Dexamethasone 8 mg starting the day after chemotherapy for two days.  Routing to notify provider to call Pharmacy or document communication to pharmacy via Surescripts orders of any cancellations, revisions, discontinued or special instructions for medication orders.

## 2017-02-01 NOTE — Progress Notes (Signed)
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  HISTORY OF PRESENTING ILLNESS: [name] 71 y.o. female is here because of new diagnosis of lung cancer. Her oncologic history is as follows:    Squamous cell carcinoma of lung, stage IV (HCC)   07/07/2016 Imaging    CXR: Left suprahilar pneumonia versus mass. Recommend short follow-up chest x-ray or chest CT.      07/09/2016 Imaging    CT chest w/o contrast: Spiculated mass lesion in the left upper lobe consistent with primary neoplasm. Bronchoscopic biopsy may be helpful for further evaluation.      12/07/2016 Imaging    CT chest: The anterior left upper lobe lung mass measures 4.8 x 7.0 cm which is increased in size. On the prior study, it measures 3.2 x 2.9 cm. There is a background of significant emphysema. There are associated reticulonodular opacities superior to the mass within the left upper lobe. Subsegmental atelectasis at the right base.  Innumerable lesions are present throughout the liver. There are nonspecific lesions in the bilateral kidneys. Adrenal glands are unremarkable.      12/30/2016 PET scan    5.5 cm hypermetabolic mass in medial left upper lobe, consistent with bronchogenic carcinoma.  Hypermetabolic mild left hilar lymphadenopathy, consistent with lymph node metastasis.  Diffuse liver metastases.  Hypermetabolic portacaval lymph node in the abdomen, suspicious for lymph node metastasis.  No evidence of metastatic disease within the neck or pelvis.      01/07/2017 Procedure    Diagnosis Liver, needle/core biopsy METASTATIC POORLY DIFFERENTIATED CARCINOMA  Microscopic Comment The neoplasm has high mitoses, apotosis and necrosis, which  shows positive for p63, ck5/6, TTF-1(focal), p53, negative for synatophysin, chromogranin and p16. The differential diagnosis includes basaloid squamous cell carcinoma of the lung.      01/07/2017 Initial Diagnosis    Squamous cell carcinoma of lung, stage IV [facility])      Patient presents today with her brother and grandson. Patient was initially diagnosed with left spiculated lung mass in February 2018 with a follow-up CT chest. Somehow she was never biopsied at that time. She got a repeat follow-up CT chest in July 2018 which demonstrated doubling of the lung mass and new innumerable liver metastasis. 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 She states that she uses home oxygen and is trying to get a portable oxygen. She states that her appetite is decreased, and she may have had some small amount of weight loss. She states that she has right upper quadrant pain especially with deep inspiration. She denies any chest pain. She feels fatigued. Patient currently does not smoke. [name] a former smoker and smoked 1/2 ppd for 30 years but quit in 2013. She denies any hemoptysis or coughing up of any blood.   Review of Systems - Oncology ROS as per HPI otherwise 12 point ROS is negative.   MEDICAL HISTORY: Past Medical History:  Diagnosis Date  . Arthritis    knee  . Bronchitis   . COPD (chronic obstructive pulmonary disease) (HCC)   . Diabetes mellitus   . Diarrhea    not constant- frequent  . ESRD on hemodialysis (HCC)  [facility]. Started HD November 26, 2009. 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  Marland Kitchen Family history of anesthesia complication    SON- VOMITS  . Hyperlipidemia   . Hypertension   . Hypothyroidism   . Leg pain   . Peripheral vascular disease (HCC)   . Refusal of blood transfusions as patient is Jehovah's Witness    patient is Fish farm manager witness  .  Retroperitoneal bleeding 06/2016  . Stroke (HCC)   . Thyroid disease     SURGICAL HISTORY: Past Surgical History:  Procedure Laterality Date  . AMPUTATION Left 07/25/2012   Procedure: LEFT AMPUTATION BELOW KNEE;  Surgeon: Nadara Mustard, MD;  Location: [facility] OR;  Service: Orthopedics;  Laterality: Left;  Left Below Knee Amputation  . AV FISTULA PLACEMENT  05/18/2010  . BASCILIC VEIN TRANSPOSITION Right 11/23/2012   Procedure: RIGHT BASCILIC VEIN TRANSPOSITION ;  Surgeon: Chuck Hint, MD;  Location[facility];  Service: Vascular;  Laterality: Right;  . CHOLECYSTECTOMY    . DG AV DIALYSIS SHUNT ACCESS EXIST*R* OR     working right HD catheter  . FEMUR IM NAIL  05/20/2012   Procedure: INTRAMEDULLARY (IM) RETROGRADE FEMORAL NAILING;  Surgeon: Budd Palmer, MD;  Location: [facility] OR;  Service: Orthopedics;  Laterality: Left;  . IR GENERIC HISTORICAL  07/07/2016   IR US GUIDE VASC ACCESS LEFT 07/07/2016 Richarda Overlie, MD [facility]  . IR GENERIC HISTORICAL  07/07/2016   IR FLUORO GUIDE CV LINE LEFT 07/07/2016 Richarda Overlie, MD [facility]    SOCIAL HISTORY: Social History   Social History  . Marital status: Legally Separated    Spouse name: N/A  . Number of children: N/A  . Years of education: N/A   Occupational History  . Not on file.   Social History Main Topics  . Smoking status: Former Smoker    Packs/day: 0.50    Years: 30.00    Types: Cigarettes    Quit date: 04/29/2012  . Smokeless tobacco: Never Used  . Alcohol use No  . Drug use: No  . Sexual activity: Not on file   Other Topics Concern  . Not on file   Social History Narrative  . No narrative on file    FAMILY HISTORY Family History  Problem Relation Age of Onset  . Diabetes Mother     ALLERGIES:  is allergic to contrast media [iodinated diagnostic agents] and latex.  MEDICATIONS:  Current Outpatient Prescriptions  Medication Sig Dispense Refill  . acetaminophen (TYLENOL) 500 MG tablet Take 2,000 mg by mouth 3  (three) times daily as needed for moderate pain or headache.    Marland Kitchen amLODipine (NORVASC) 5 MG tablet Take 5 mg by mouth daily.    . calcium acetate (PHOSLO) 667 MG capsule Take 1 capsule (667 mg total) by mouth 2 (two) times daily with a meal. (Patient taking differently: Take 1,334 mg by mouth 3 (three) times daily after meals. )    . insulin glargine (LANTUS) 100 UNIT/ML injection Inject 0.1 mLs (10 Units total) into the skin at bedtime. (Patient taking differently: Inject 20 Units into the skin at bedtime. ) 10 mL 0  . insulin lispro (HUMALOG) 100 UNIT/ML injection Inject 8 Units into the skin daily after breakfast.    . levothyroxine (SYNTHROID, LEVOTHROID) 75 MCG tablet Take 75 mcg by mouth daily.      Marland Kitchen linagliptin (TRADJENTA) 5 MG TABS tablet Take 5 mg by mouth daily.    . metoCLOPramide (REGLAN) 5 MG tablet  Take 1-2 tablets (5-10 mg total) by mouth every 8 (eight) hours as needed (if ondansetron (ZOFRAN) ineffective.).    Marland Kitchen Multiple Vitamins-Minerals (RENAL) TABS Take 1 tablet by mouth daily.    . ondansetron (ZOFRAN) 4 MG tablet Take 1 tablet (4 mg total) by mouth every 6 (six) hours as needed for nausea. 20 tablet    No current facility-administered medications for this visit.     PHYSICAL EXAMINATION:  ECOG PERFORMANCE STATUS: 1 - Symptomatic but completely ambulatory   Vitals:   02/01/17 1307  BP: (!) 126/40  Pulse: 76  Resp: 16  SpO2: 96%    Filed Weights   02/01/17 1307  Weight: 173 lb (78.5 kg)     Physical Exam Constitutional: Well-developed, well-nourished, and in no distress.   HENT:  Head: Normocephalic and atraumatic.  Mouth/Throat: No oropharyngeal exudate. Mucosa moist. Eyes: Pupils are equal, round, and reactive to light. Conjunctivae are normal. No scleral icterus.  Neck: Normal range of motion. Neck supple. No JVD present.  Cardiovascular: Normal rate, regular rhythm and normal heart sounds.  Exam reveals no gallop and no friction rub.   No murmur  heard. Pulmonary/Chest: Effort normal and breath sounds normal. No respiratory distress. No wheezes.No rales.  Abdominal: Soft. Bowel sounds are normal. No distension. There is no tenderness. There is no guarding.  Musculoskeletal: left BKA. Mild 1+ RLE edema. RUE AV graft for dialysis. Lymphadenopathy:    No cervical or supraclavicular adenopathy.  Neurological: Alert and oriented to person, place, and time. No cranial nerve deficit.  Skin: Skin is warm and dry. No rash noted. No erythema. No pallor.  Psychiatric: Affect and judgment normal.   LABORATORY DATA: I have personally reviewed the data as listed:  Hospital Outpatient Visit on 01/18/2017  Component Date Value Ref Range Status  . aPTT 01/18/2017 34  24 - 36 seconds Final  . WBC 01/18/2017 9.9  4.0 - 10.5 K/uL Final  . RBC 01/18/2017 3.97  3.87 - 5.11 MIL/uL Final  . Hemoglobin 01/18/2017 10.1* 12.0 - 15.0 g/dL Final  . HCT 84/13/2440 33.7* 36.0 - 46.0 % Final  . MCV 01/18/2017 84.9  78.0 - 100.0 fL Final  . MCH 01/18/2017 25.4* 26.0 - 34.0 pg Final  . MCHC 01/18/2017 30.0  30.0 - 36.0 g/dL Final  . RDW 03/26/2535 16.8* 11.5 - 15.5 % Final  . Platelets 01/18/2017 246  150 - 400 K/uL Final  . Prothrombin Time 01/18/2017 14.3  11.4 - 15.2 seconds Final  . INR 01/18/2017 1.10   Final  . Glucose-Capillary 01/18/2017 95  65 - 99 mg/dL Final   PATHOLOGY: Patient: KATRIEL, WARZECHA Collected: 01/18/2017 Client: [facility] Accession: [id number] Received: 01/18/2017 Art Hoss DOB: Oct 14, 1945 Age: 75 Gender: F Reported: 01/23/2017 [address] Patient Ph: [phone] MRN #: [id number] [place], [place] [address] Visit #: [id number] Chart #: Phone: NULL Fax: CC: Kari Baars REPORT OF SURGICAL PATHOLOGY FINAL DIAGNOSIS Diagnosis Liver, needle/core biopsy METASTATIC POORLY DIFFERENTIATED CARCINOMA Microscopic Comment The neoplasm has high mitoses, apotosis and necrosis, which shows positive for p63, ck5/6,  TTF-1(focal), p53, negative for synatophysin, chromogranin and p16. The differential diagnosis includes basaloid squamous cell carcinoma of the lung. 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  Plan: -I have reviewed diagnosis of stage IV squamous cell carcinoma of the once with metastasis to the liver and abdominal lymph nodes in detail with her and her family today.  -I have discussed with her that given that she is stage IV disease, her lung malignancy is not curable however we can give chemotherapy for palliation and also to slow down progression of her disease.  -I have discussed chemotherapy with carboplatin and Taxol every 3 weeks in detail with her and her family including side effects of chemotherapy. Patient is willing to proceed with chemotherapy. Therefore we'll get a chemotherapy port placed ASAP and start her chemotherapy once 2 days after her port is placed. Patient normally gets dialysis on Tuesday, Thursdays, Saturdays therefore we'll schedule her for chemotherapy treatment to be on Monday or Wednesday. Patient is set to see Dr. Lovell Sheehan for chemoport placement evaluation on 02/09/17 and we will tentatively plan to start her chemo on 02/13/17. -Return to clinic 10 days after her first cycle chemotherapy for a nadir check.  All questions were answered. The patient knows to call the clinic with any problems, questions or concerns.  This note was electronically signed.    Ralene Cork, MD  02/01/2017 1:21 PM

## 2017-02-02 DIAGNOSIS — N2581 Secondary hyperparathyroidism of renal origin: Secondary | ICD-10-CM | POA: Diagnosis not present

## 2017-02-02 DIAGNOSIS — Z23 Encounter for immunization: Secondary | ICD-10-CM | POA: Diagnosis not present

## 2017-02-02 DIAGNOSIS — D509 Iron deficiency anemia, unspecified: Secondary | ICD-10-CM | POA: Diagnosis not present

## 2017-02-02 DIAGNOSIS — N186 End stage renal disease: Secondary | ICD-10-CM | POA: Diagnosis not present

## 2017-02-02 DIAGNOSIS — E1129 Type 2 diabetes mellitus with other diabetic kidney complication: Secondary | ICD-10-CM | POA: Diagnosis not present

## 2017-02-02 DIAGNOSIS — D631 Anemia in chronic kidney disease: Secondary | ICD-10-CM | POA: Diagnosis not present

## 2017-02-02 NOTE — Telephone Encounter (Signed)
Called and spoke with Tameka at [facility] in [place]. She states patient had already picked up the medication. I made sure the prescription was correct. The pharmacy did use the last prescription sent which was the correct one for the dexamethasone 8 mg starting the day after chemo for 2 days.

## 2017-02-04 DIAGNOSIS — Z23 Encounter for immunization: Secondary | ICD-10-CM | POA: Diagnosis not present

## 2017-02-04 DIAGNOSIS — N186 End stage renal disease: Secondary | ICD-10-CM | POA: Diagnosis not present

## 2017-02-04 DIAGNOSIS — N2581 Secondary hyperparathyroidism of renal origin: Secondary | ICD-10-CM | POA: Diagnosis not present

## 2017-02-04 DIAGNOSIS — E1129 Type 2 diabetes mellitus with other diabetic kidney complication: Secondary | ICD-10-CM | POA: Diagnosis not present

## 2017-02-04 DIAGNOSIS — D509 Iron deficiency anemia, unspecified: Secondary | ICD-10-CM | POA: Diagnosis not present

## 2017-02-04 DIAGNOSIS — D631 Anemia in chronic kidney disease: Secondary | ICD-10-CM | POA: Diagnosis not present

## 2017-02-07 ENCOUNTER — Other Ambulatory Visit (HOSPITAL_COMMUNITY): Payer: Self-pay | Admitting: Oncology

## 2017-02-07 DIAGNOSIS — D509 Iron deficiency anemia, unspecified: Secondary | ICD-10-CM | POA: Diagnosis not present

## 2017-02-07 DIAGNOSIS — N2581 Secondary hyperparathyroidism of renal origin: Secondary | ICD-10-CM | POA: Diagnosis not present

## 2017-02-07 DIAGNOSIS — Z23 Encounter for immunization: Secondary | ICD-10-CM | POA: Diagnosis not present

## 2017-02-07 DIAGNOSIS — E1129 Type 2 diabetes mellitus with other diabetic kidney complication: Secondary | ICD-10-CM | POA: Diagnosis not present

## 2017-02-07 DIAGNOSIS — N186 End stage renal disease: Secondary | ICD-10-CM | POA: Diagnosis not present

## 2017-02-07 DIAGNOSIS — D631 Anemia in chronic kidney disease: Secondary | ICD-10-CM | POA: Diagnosis not present

## 2017-02-07 MED ORDER — ONDANSETRON HCL 8 MG PO TABS: 8.0000 mg | ORAL_TABLET | Freq: Three times a day (TID) | ORAL | 2 refills | Status: AC | PRN

## 2017-02-07 NOTE — Patient Instructions (Signed)
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  We are going to treat you with carboplatin and taxol.  This will be given every 3 weeks.  This treatment is with palliative intent, which means you are treatable but not curable.   You will see the doctor regularly throughout treatment.  We monitor your lab work prior to every treatment.  The doctor monitors your response to treatment by the way you are feeling, your blood work, and scans periodically.  There will be wait times while you are here for treatment.  It will take about 30 minutes to 1 hour for lab work to result.  Then there will be wait times while pharmacy mixes your medications.     You will have the following premedications prior to receiving chemotherapy: Premeds: Benadryl:  Help prevent a reaction to the chemotherapy.  Pepcid: antihistamine premed Aloxi - high powered nausea/vomiting prevention medication used for chemotherapy patients.  Dexamethasone - steroid - given to reduce the risk of you having an allergic type reaction to the chemotherapy. Dex can cause you to feel energized, nervous/anxious/jittery, make you have trouble sleeping, and/or make you feel hot/flushed in the face/neck and/or look pink/red in the face/neck. These side effects will pass as the Dex wears off. (takes 20 minutes to infuse)   You will receive neulasta after each chemo treatment: Neulasta - this medication is not chemo but being given because you have had chemo. It is usually given 24-27 hours after the completion of chemotherapy. This medication works by boosting your bone marrow's supply of white blood cells. White blood cells are what protect our bodies against infection. The medication is given in the form of a subcutaneous injection. It is given in the fatty tissue of your abdomen. It is a short needle. The major side effect of this medication is bone  or muscle pain. The drug of choice to relieve or lessen the pain is Aleve or Ibuprofen. If a physician has ever told you not to take Aleve or Ibuprofen - then don't take it. You should then take Tylenol/acetaminophen. Take either medication as the bottle directs you to.  The level of pain you experience as a result of this injection can range from none, to mild or moderate, or severe. Please let us know if you develop moderate or severe bone pain.   You can take Claritin 10 mg over the counter for a few days after receiving neulasta to help with the bone aches and pains.    **DO NOT expose the Neulasta On-body injector to diagnostic imaging (CT scans, MRI, Ultrasound, X-ray), radiation treatment, or oxygen rich environments, such as hyperbaric chambers. **    POTENTIAL SIDE EFFECTS OF TREATMENT:  Carboplatin (Generic Name) Other Names: Paraplatin, CBDCA  About This Drug Carboplatin is a drug used to treat cancer. This drug is given in the vein (IV). This drug takes about 30 minutes to infuse.   Possible Side Effects (More Common) . Nausea and throwing up (vomiting). These symptoms may happen within a few hours after your treatment and may last up to 24 hours. Medicines are available to stop or lessen these side effects. . Bone marrow depression. This is a decrease in the number of white blood cells, red blood cells, and platelets. This may raise your risk of infection, make you tired and weak (fatigue), and raise your risk of bleeding. . Soreness of  the mouth and throat. You may have red areas, white patches, or sores that hurt. . This drug may affect how your kidneys work. Your kidney function will be checked as needed. . Electrolyte changes. Your blood will be checked for electrolyte changes as needed.  Side Effects (Less Common) . Hair loss. Some patients lose their hair on the scalp and body. You may notice your hair thinning seven to 14 days after getting this drug. . Effects on the  nerves are called peripheral neuropathy. You may feel numbness, tingling, or pain in your hands and feet. It may be hard for you to button your clothes, open jars, or walk as usual. The effect on the nerves may get worse with more doses of the drug. These effects get better in some people after the drug is stopped but it does not get better in all people. . Loose bowel movements (diarrhea) that may last for several days . Decreased hearing or ringing in the ears . Changes in the way food and drinks taste . Changes in liver function. Your liver function will be checked as needed.  Allergic Reactions Serious allergic reactions including anaphylaxis are rare. While you are getting this drug in your vein (IV), tell your nurse right away if you have any of these symptoms of an allergic reaction: . Trouble catching your breath . Feeling like your tongue or throat are swelling . Feeling your heart beat quickly or in a not normal way (palpitations) . Feeling dizzy or lightheaded . Flushing, itching, rash, and/or hives  Treating Side Effects . Drink 6-8 cups of fluids each day unless your doctor has told you to limit your fluid intake due to some other health problem. A cup is 8 ounces of fluid. If you throw up or have loose bowel movements, you should drink more fluids so that you do not become dehydrated (lack water in the body from losing too much fluid). . Mouth care is very important. Your mouth care should consist of routine, gentle cleaning of your teeth or dentures and rinsing your mouth with a mixture of 1/2 teaspoon of salt in 8 ounces of water or  teaspoon of baking soda in 8 ounces of water. This should be done at least after each meal and at bedtime. . If you have mouth sores, avoid mouthwash that has alcohol. Avoid alcohol and smoking because they can bother your mouth and throat. . If you have numbness and tingling in your hands and feet, be careful when cooking, walking, and handling sharp  objects and hot liquids. . Talk with your nurse about getting a wig before you lose your hair. Also, call the [facility] at 800-ACS-2345 to find out information about the "Look Good, Feel Better" program close to where you live. It is a free program where women getting chemotherapy can learn about wigs, turbans and scarves as well as makeup techniques and skin and nail care.  Food and Drug Interactions There are no known interactions of carboplatin with food. This drug may interact with other medicines. Tell your doctor and pharmacist about all the medicines and dietary supplements (vitamins, minerals, herbs and others) that you are taking at this time. The safety and use of dietary supplements and alternative diets are often not known. Using these might affect your cancer or interfere with your treatment. Until more is known, you should not use dietary supplements or alternative diets without your doctor's help.  When to Call the Doctor Call your doctor  or nurse right away if you have any of these symptoms: . Fever of 100.5 F (38 C) or above; chills . Bleeding or bruising that is not normal . Wheezing or trouble breathing . Nausea that stops you from eating or drinking . Throwing up more than once a day . Rash or itching . Loose bowel movements (diarrhea) more than four times a day or diarrhea with weakness or feeling lightheaded . Call your doctor or nurse as soon as possible if any of these symptoms happen: . Numbness, tingling, decreased feeling or weakness in fingers, toes, arms, or legs . Change in hearing, ringing in the ears . Blurred vision or other changes in eyesight . Decreased urine . Yellowing of skin or eyes  Problems and Reproductive Concerns Sexual problems and reproduction concerns may happen. In both men and women, this drug may affect your ability to have children. This cannot be determined before your treatment. Talk with your doctor or nurse if you plan to  have children. Ask for information on sperm or egg banking. In men, this drug may interfere with your ability to make sperm, but it should not change your ability to have sexual relations. In women, menstrual bleeding may become irregular or stop while you are getting this drug. Do not assume that you cannot become pregnant if you do not have a menstrual period. Women may go through signs of menopause (change of life) like vaginal dryness or itching. Vaginal lubricants can be used to lessen vaginal dryness, itching, and pain during sexual relations. Genetic counseling is available for you to talk about the effects of this drug therapy on future pregnancies. Also, a genetic counselor can look at the possible risk of problems in the unborn baby due to this medicine if an exposure happens during pregnancy. . Pregnancy warning: This drug may have harmful effects on the unborn child, so effective methods of birth control should be used during your cancer treatment. . Breast feeding warning: It is not known if this drug passes into breast milk. For this reason, women should talk to their doctor about the risks and benefits of breast feeding during treatment with this drug because this drug may enter the breast milk and badly harm a breast feeding baby.   Paclitaxel (Taxol)  Paclitaxel is a drug used to treat cancer. It is given in the vein (IV).  This will take 3 hours to infuse.   Possible Side Effects . Hair loss. Hair loss is often temporary, although with certain medicine, hair loss can sometimes be permanent. Hair loss may happen suddenly or gradually. If you lose hair, you may lose it from your head, face, armpits, pubic area, chest, and/or legs. You may also notice your hair getting thin. . Swelling of your legs, ankles and/or feet (edema) . Flushing . Nausea and throwing up (vomiting) . Loose bowel movements (diarrhea) . Bone marrow depression. This is a decrease in the number of white blood  cells, red blood cells, and platelets. This may raise your risk of infection, make you tired and weak (fatigue), and raise your risk of bleeding. . Effects on the nerves are called peripheral neuropathy. You may feel numbness, tingling, or pain in your hands and feet. It may be hard for you to button your clothes, open jars, or walk as usual. The effect on the nerves may get worse with more doses of the drug. These effects get better in some people after the drug is stopped but it does not  get better in all people. . Changes in your liver function . Bone, joint and muscle pain . Abnormal EKG . Allergic reaction: Allergic reactions, including anaphylaxis are rare but may happen in some patients. Signs of allergic reaction to this drug may be swelling of the face, feeling like your tongue or throat are swelling, trouble breathing, rash, itching, fever, chills, feeling dizzy, and/or feeling that your heart is beating in a fast or not normal way. If this happens, do not take another dose of this drug. You should get urgent medical treatment. . Infection . Changes in your kidney function. Note: Each of the side effects above was reported in 20% or greater of patients treated with paclitaxel. Not all possible side effects are included above. Warnings and Precautions . Severe bone marrow depression  Side Effects . To help with hair loss, wash with a mild shampoo and avoid washing your hair every day. . Avoid rubbing your scalp, instead, pat your hair or scalp dry . Avoid coloring your hair . Limit your use of hair spray, electric curlers, blow dryers, and curling irons. . If you are interested in getting a wig, talk to your nurse. You can also call the [facility] at 800-ACS-2345 to find out information about the "Look Good, Feel Better" program close to where you live. It is a free program where women getting chemotherapy can learn about wigs, turbans and scarves as well as makeup techniques  and skin and nail care. . Ask your doctor or nurse about medicines that are available to help stop or lessen diarrhea and/or nausea. . To help with nausea and vomiting, eat small, frequent meals instead of three large meals a day. Choose foods and drinks that are at room temperature. Ask your nurse or doctor about other helpful tips and medicine that is available to help or stop lessen these symptoms. . If you get diarrhea, eat low-fiber foods that are high in protein and calories and avoid foods that can irritate your digestive tracts or lead to cramping. Ask your nurse or doctor about medicine that can lessen or stop your diarrhea. . Mouth care is very important. Your mouth care should consist of routine, gentle cleaning of your teeth or dentures and rinsing your mouth with a mixture of 1/2 teaspoon of salt in 8 ounces of water or  teaspoon of baking soda in 8 ounces of water. This should be done at least after each meal and at bedtime. . If you have mouth sores, avoid mouthwash that has alcohol. Also avoid alcohol and smoking because they can bother your mouth and throat. . Drink plenty of fluids (a minimum of eight glasses per day is recommended). . Take your temperature as your doctor or nurse tells you, and whenever you feel like you may have a fever. . Talk to your doctor or nurse about precautions you can take to avoid infections and bleeding. . Be careful when cooking, walking, and handling sharp objects and hot liquids.  Food and Drug Interactions . There are no known interactions of paclitaxel with food. . This drug may interact with other medicines. Tell your doctor and pharmacist about all the medicines and dietary supplements (vitamins, minerals, herbs and others) that you are taking at this time. . The safety and use of dietary supplements and alternative diets are often not known. Using these might affect your cancer or interfere with your treatment. Until more is known, you should not  use dietary supplements or alternative diets  without your cancer doctor's help.  When to Call the Doctor Call your doctor or nurse if you have any of the following symptoms and/or any new or unusual symptoms: . Fever of 100.5 F (38 C) or above . Chills . Redness, pain, warmth, or swelling at the IV site during the infusion . Signs of allergic reaction: swelling of the face, feeling like your tongue or throat are swelling, trouble breathing, rash, itching, fever, chills, feeling dizzy, and/or feeling that your heart is beating in a fast or not normal way . Feeling that your heart is beating in a fast or not normal way (palpitations) . Weight gain of 5 pounds in one week (fluid retention) . Decreased urine or very dark urine . Signs of liver problems: dark urine, pale bowel movements, bad stomach pain, feeling very tired and weak, unusual itching, or yellowing of the eyes or skin . Heavy menstrual period that lasts longer than normal . Easy bruising or bleeding . Nausea that stops you from eating or drinking, and/or that is not relieved by prescribed medicines. . Loose bowel movements (diarrhea) more than 4 times a day or diarrhea with weakness or lightheadedness . Pain in your mouth or throat that makes it hard to eat or drink . Lasting loss of appetite or rapid weight loss of five pounds in a week . Signs of peripheral neuropathy: numbness, tingling, or decreased feeling in fingers or toes; trouble walking or changes in the way you walk; or feeling clumsy when buttoning clothes, opening jars, or other routine activities . Joint and muscle pain that is not relieved by prescribed medicines . Extreme fatigue that interferes with normal activities . While you are getting this drug, please tell your nurse right away if you have any pain, redness, or swelling at the site of the IV infusion. . If you think you are pregnant.  Reproduction Warnings . Pregnancy warning: This drug may have harmful  effects on the unborn child, it is recommended that effective methods of birth control should be used during your cancer treatment. Let your doctor know right away if you think you may be pregnant. . Breast feeding warning: Women should not breast feed during treatment because this drug could enter the breast milk and cause harm to a breast feeding baby.     SELF CARE ACTIVITIES WHILE ON CHEMOTHERAPY: Hydration Increase your fluid intake 48 hours prior to treatment and drink at least 8 to 12 cups (64 ounces) of water/decaff beverages per day after treatment. You can still have your cup of coffee or soda but these beverages do not count as part of your 8 to 12 cups that you need to drink daily. No alcohol intake.  Medications Continue taking your normal prescription medication as prescribed.  If you start any new herbal or new supplements please let us know first to make sure it is safe.  Mouth Care Have teeth cleaned professionally before starting treatment. Keep dentures and partial plates clean. Use soft toothbrush and do not use mouthwashes that contain alcohol. Biotene is a good mouthwash that is available at most pharmacies or may be ordered by calling [phone]. Use warm salt water gargles (1 teaspoon salt per 1 quart warm water) before and after meals and at bedtime. Or you may rinse with 2 tablespoons of three-percent hydrogen peroxide mixed in eight ounces of water. If you are still having problems with your mouth or sores in your mouth please call the clinic. If you need dental  work, please let the doctor know before you go for your appointment so that we can coordinate the best possible time for you in regards to your chemo regimen. You need to also let your dentist know that you are actively taking chemo. We may need to do labs prior to your dental appointment.   Skin Care Always use sunscreen that has not expired and with SPF (Sun Protection Factor) of 50 or higher. Wear hats to  protect your head from the sun. Remember to use sunscreen on your hands, ears, face, & feet.  Use good moisturizing lotions such as udder cream, eucerin, or even Vaseline. Some chemotherapies can cause dry skin, color changes in your skin and nails.    . Avoid long, hot showers or baths. . Use gentle, fragrance-free soaps and laundry detergent. . Use moisturizers, preferably creams or ointments rather than lotions because the thicker consistency is better at preventing skin dehydration. Apply the cream or ointment within 15 minutes of showering. Reapply moisturizer at night, and moisturize your hands every time after you wash them.  Hair Loss (if your doctor says your hair will fall out)  . If your doctor says that your hair is likely to fall out, decide before you begin chemo whether you want to wear a wig. You may want to shop before treatment to match your hair color. . Hats, turbans, and scarves can also camouflage hair loss, although some people prefer to leave their heads uncovered. If you go bare-headed outdoors, be sure to use sunscreen on your scalp. . Cut your hair short. It eases the inconvenience of shedding lots of hair, but it also can reduce the emotional impact of watching your hair fall out. . Don't perm or color your hair during chemotherapy. Those chemical treatments are already damaging to hair and can enhance hair loss. Once your chemo treatments are done and your hair has grown back, it's OK to resume dyeing or perming hair. With chemotherapy, hair loss is almost always temporary. But when it grows back, it may be a different color or texture. In older adults who still had hair color before chemotherapy, the new growth may be completely gray.  Often, new hair is very fine and soft.  Infection Prevention Please wash your hands for at least 30 seconds using warm soapy water. Handwashing is the #1 way to prevent the spread of germs. Stay away from sick people or people who are  getting over a cold. If you develop respiratory systems such as green/yellow mucus production or productive cough or persistent cough let us know and we will see if you need an antibiotic. It is a good idea to keep a pair of gloves on when going into grocery stores/[employer] to decrease your risk of coming into contact with germs on the carts, etc. Carry alcohol hand gel with you at all times and use it frequently if out in public. If your temperature reaches 100.5 or higher please call the clinic and let us know.  If it is after hours or on the weekend please go to the ER if your temperature is over 100.5.  Please have your own personal thermometer at home to use.    Sex and bodily fluids If you are going to have sex, a condom must be used to protect the person that isn't taking chemotherapy. Chemo can decrease your libido (sex drive). For a few days after chemotherapy, chemotherapy can be excreted through your bodily fluids.  When using the toilet  please close the lid and flush the toilet twice.  Do this for a few day after you have had chemotherapy.   Effects of chemotherapy on your sex life Some changes are simple and won't last long. They won't affect your sex life permanently. Sometimes you may feel: . too tired . not strong enough to be very active . sick or sore  . not in the mood . anxious or low Your anxiety might not seem related to sex. For example, you may be worried about the cancer and how your treatment is going. Or you may be worried about money, or about how you family are coping with your illness. These things can cause stress, which can affect your interest in sex. It's important to talk to your partner about how you feel. Remember - the changes to your sex life don't usually last long. There's usually no medical reason to stop having sex during chemo. The drugs won't have any long term physical effects on your performance or enjoyment of sex. Cancer can't be passed on to your  partner during sex  Contraception It's important to use reliable contraception during treatment. Avoid getting pregnant while you or your partner are having chemotherapy. This is because the drugs may harm the baby. Sometimes chemotherapy drugs can leave a man or woman infertile.  This means you would not be able to have children in the future. You might want to talk to someone about permanent infertility. It can be very difficult to learn that you may no longer be able to have children. Some people find counselling helpful. There might be ways to preserve your fertility, although this is easier for men than for women. You may want to speak to a fertility expert. You can talk about sperm banking or harvesting your eggs. You can also ask about other fertility options, such as donor eggs. If you have or have had breast cancer, your doctor might advise you not to take the contraceptive pill. This is because the hormones in it might affect the cancer.  It is not known for sure whether or not chemotherapy drugs can be passed on through semen or secretions from the vagina. Because of this some doctors advise people to use a barrier method if you have sex during treatment. This applies to vaginal, anal or oral sex. Generally, doctors advise a barrier method only for the time you are actually having the treatment and for about a week after your treatment. Advice like this can be worrying, but this does not mean that you have to avoid being intimate with your partner. You can still have close contact with your partner and continue to enjoy sex.  Animals If you have cats or birds we just ask that you not change the litter or change the cage.  Please have someone else do this for you while you are on chemotherapy.   Food Safety During and After Cancer Treatment Food safety is important for people both during and after cancer treatment. Cancer and cancer treatments, such as chemotherapy, radiation therapy, and  stem cell/bone marrow transplantation, often weaken the immune system. This makes it harder for your body to protect itself from foodborne illness, also called food poisoning. Foodborne illness is caused by eating food that contains harmful bacteria, parasites, or viruses.  Foods to avoid Some foods have a higher risk of becoming tainted with bacteria. These include: [place] [employer] Unwashed fresh fruit and vegetables, especially leafy vegetables that can hide dirt and other contaminants .  Raw sprouts, such as alfalfa sprouts . Raw or undercooked beef, especially ground beef, or other raw or undercooked meat and poultry . Fatty, fried, or spicy foods immediately before or after treatment.  These can sit heavy on your stomach and make you feel nauseous. . Raw or undercooked shellfish, such as oysters. . Sushi and sashimi, which often contain raw fish.  . Unpasteurized beverages, such as unpasteurized fruit juices, raw milk, raw yogurt, or cider . Undercooked eggs, such as soft boiled, over easy, and poached; raw, unpasteurized eggs; or foods made with raw egg, such as homemade raw cookie dough and homemade mayonnaise Simple steps for food safety Shop smart. . Do not buy food stored or displayed in an unclean area. . Do not buy bruised or damaged fruits or vegetables. . Do not buy cans that have cracks, dents, or bulges. . Pick up foods that can spoil at the end of your shopping trip and store them in a cooler on the way home. Prepare and clean up foods carefully. . Rinse all fresh fruits and vegetables under running water, and dry them with a clean towel or paper towel. . Clean the top of cans before opening them. . After preparing food, wash your hands for 20 seconds with hot water and soap. Pay special attention to areas between fingers and under nails. . Clean your utensils and dishes with hot water and soap. Marland Kitchen Disinfect your kitchen and cutting boards using 1 teaspoon of liquid, unscented bleach mixed  into 1 quart of water.   Dispose of old food. . Eat canned and packaged food before its expiration date (the "use by" or "best before" date). . Consume refrigerated leftovers within 3 to 4 days. After that time, throw out the food. Even if the food does not smell or look spoiled, it still may be unsafe. Some bacteria, such as Listeria, can grow even on foods stored in the refrigerator if they are kept for too long. Take precautions when eating out. . At restaurants, avoid buffets and salad bars where food sits out for a long time and comes in contact with many people. Food can become contaminated when someone with a virus, often a norovirus, or another "bug" handles it. . Put any leftover food in a "to-go" container yourself, rather than having the server do it. And, refrigerate leftovers as soon as you get home. . Choose restaurants that are clean and that are willing to prepare your food as you order it cooked.    MEDICATIONS: Dexamethasone 4 mg tablet:  Take 2 tablets (8 mg total) by mouth daily. Start the day after chemotherapy for 2 days.  Take with food.  Zofran/Ondansetron 8mg  tablet. Take 1 tablet every 8 hours as needed for nausea/vomiting. (#1 nausea med to take, this can constipate)  Compazine/Prochlorperazine 10mg  tablet. Take 1 tablet every 6 hours as needed for nausea/vomiting. (#2 nausea med to take, this can make you sleepy)   EMLA cream. Apply a quarter size amount to port site 1 hour prior to chemo. Do not rub in. Cover with plastic wrap.    Over-the-Counter Meds:  Miralax 1 capful in 8 oz of fluid daily. May increase to two times a day if needed. This is a stool softener. If this doesn't work proceed you can add:  Senokot S-start with 1 tablet two times a day and increase to 4 tablets two times a day if needed.  (total of 8 tablets in a 24 hour period). This is a stimulant laxative.   Call us if this does not help your bowels move.   Imodium 2mg  capsule. Take 2 capsules after the 1st loose stool and then 1 capsule every 2 hours until you go a total of 12 hours without having a loose stool. Call the [facility] if loose stools continue. If diarrhea occurs @ bedtime, take 2 capsules @ bedtime. Then take 2 capsules every 4 hours until morning. Call [facility].   Diarrhea Sheet  If you are having loose stools/diarrhea, please purchase Imodium and begin taking as outlined:  At the first sign of poorly formed or loose stools you should begin taking Imodium(loperamide) 2 mg capsules.  Take two caplets (4mg ) followed by one caplet (2mg ) every 2 hours until you have had no diarrhea for 12 hours.  During the night take two caplets (4mg ) at bedtime and continue every 4 hours during the night until the morning.  Stop taking Imodium only after there is no sign of diarrhea for 12 hours.    Always call the [facility] if you are having loose stools/diarrhea that you can't get under control.  Loose stools/disrrhea leads to dehydration (loss of water) in your body.  We have other options of trying to get the loose stools/diarrhea to stopped but you must let us know!    Constipation Sheet *Miralax in 8 oz of fluid daily.  May increase to two times a day if needed.  This is a stool softener.  If this not enough to keep your bowel regular:  You can add:  *Senokot S, start with one tablet twice a day and can increase to 4 tablets twice a day if needed.  This is a stimulant laxative.   Sometimes when you take pain medication you need BOTH a medicine to keep your stool soft and a medicine to help your bowel push it out!  Please call if the above does not work for you.   Do not go more than 2 days without a bowel movement.  It is very important that you do not become constipated.  It will make you feel sick to your  stomach (nausea) and can cause abdominal pain and vomiting.    Nausea Sheet  Zofran/Ondansetron 8mg  tablet. Take 1 tablet every 8 hours as needed for nausea/vomiting. (#1 nausea med to take, this can constipate)  Compazine/Prochlorperazine 10mg  tablet. Take 1 tablet every 6 hours as needed for nausea/vomiting. (#2 nausea med to take, this can make you sleepy)  You can take these medications together or separately.  We would first like for you to try the Ondansetron by itself and then take the Prochloperizine if needed. But you are  allowed to take both medications at the same time if your nausea is that severe.  If you are having persistent nausea (nausea that does not stop) please take these medications on a staggered schedule so that the nausea medication stays in your body.  Please call the [facility] and let us know the amount of nausea that you are experiencing.  If you begin to vomit, you need to call the [facility] and if it is the weekend and you have vomited more than one time and cant get it to stop-go to the Emergency Room.  Persistent nausea/vomiting can lead to dehydration (loss of fluid in your body) and will make you feel terrible.   Ice chips, sips of clear liquids, foods that are @ room temperature, crackers, and toast tend to be better tolerated.     SYMPTOMS TO REPORT AS SOON AS POSSIBLE AFTER TREATMENT:  FEVER GREATER THAN 100.5 F  CHILLS WITH OR WITHOUT FEVER  NAUSEA AND VOMITING THAT IS NOT CONTROLLED WITH YOUR NAUSEA MEDICATION  UNUSUAL SHORTNESS OF BREATH  UNUSUAL BRUISING OR BLEEDING  TENDERNESS IN MOUTH AND THROAT WITH OR WITHOUT PRESENCE OF ULCERS  URINARY PROBLEMS  BOWEL PROBLEMS  UNUSUAL RASH    Wear comfortable clothing and clothing appropriate for easy access to any Portacath or PICC line. Let us know if there is anything that we can do to make your therapy better!    What to do if you need assistance after hours or on the weekends: CALL  [phone].  HOLD on the line, do not hang up.  You will hear multiple messages but at the end you will be connected with a nurse triage line.  They will contact the doctor if necessary.  Most of the time they will be able to assist you.  Do not call the hospital operator.    I have been informed and understand all of the instructions given to me and have received a copy. I have been instructed to call the clinic [phone] or my family physician as soon as possible for continued medical care, if indicated. I do not have any more questions at this time but understand that I may call the [facility] or the Patient Navigator at [phone] during office hours should I have questions or need assistance in obtaining follow-up care.

## 2017-02-09 ENCOUNTER — Ambulatory Visit (INDEPENDENT_AMBULATORY_CARE_PROVIDER_SITE_OTHER): Payer: Medicare Other | Admitting: General Surgery

## 2017-02-09 ENCOUNTER — Encounter: Payer: Self-pay | Admitting: General Surgery

## 2017-02-09 ENCOUNTER — Encounter (HOSPITAL_COMMUNITY)
Admission: RE | Admit: 2017-02-09 | Discharge: 2017-02-09 | Disposition: A | Discharge status: Home / Self Care | Payer: Medicare Other | Source: Ambulatory Visit | Attending: General Surgery | Admitting: General Surgery

## 2017-02-09 ENCOUNTER — Encounter (HOSPITAL_COMMUNITY): Payer: Self-pay

## 2017-02-09 VITALS — BP 120/47 | HR 84 | Temp 98.9°F | Resp 18 | Ht 63.0 in | Wt 173.0 lb

## 2017-02-09 DIAGNOSIS — J40 Bronchitis, not specified as acute or chronic: Secondary | ICD-10-CM | POA: Diagnosis not present

## 2017-02-09 DIAGNOSIS — R9431 Abnormal electrocardiogram [ECG] [EKG]: Secondary | ICD-10-CM | POA: Insufficient documentation

## 2017-02-09 DIAGNOSIS — Z9889 Other specified postprocedural states: Secondary | ICD-10-CM | POA: Insufficient documentation

## 2017-02-09 DIAGNOSIS — Z833 Family history of diabetes mellitus: Secondary | ICD-10-CM | POA: Insufficient documentation

## 2017-02-09 DIAGNOSIS — E1122 Type 2 diabetes mellitus with diabetic chronic kidney disease: Secondary | ICD-10-CM | POA: Diagnosis not present

## 2017-02-09 DIAGNOSIS — E039 Hypothyroidism, unspecified: Secondary | ICD-10-CM | POA: Diagnosis not present

## 2017-02-09 DIAGNOSIS — Z8673 Personal history of transient ischemic attack (TIA), and cerebral infarction without residual deficits: Secondary | ICD-10-CM | POA: Insufficient documentation

## 2017-02-09 DIAGNOSIS — D509 Iron deficiency anemia, unspecified: Secondary | ICD-10-CM | POA: Diagnosis not present

## 2017-02-09 DIAGNOSIS — Z794 Long term (current) use of insulin: Secondary | ICD-10-CM | POA: Diagnosis not present

## 2017-02-09 DIAGNOSIS — C3490 Malignant neoplasm of unspecified part of unspecified bronchus or lung: Secondary | ICD-10-CM | POA: Diagnosis not present

## 2017-02-09 DIAGNOSIS — Z01810 Encounter for preprocedural cardiovascular examination: Secondary | ICD-10-CM | POA: Diagnosis not present

## 2017-02-09 DIAGNOSIS — Z79899 Other long term (current) drug therapy: Secondary | ICD-10-CM | POA: Insufficient documentation

## 2017-02-09 DIAGNOSIS — N186 End stage renal disease: Secondary | ICD-10-CM | POA: Diagnosis not present

## 2017-02-09 DIAGNOSIS — Z9049 Acquired absence of other specified parts of digestive tract: Secondary | ICD-10-CM | POA: Insufficient documentation

## 2017-02-09 DIAGNOSIS — N2581 Secondary hyperparathyroidism of renal origin: Secondary | ICD-10-CM | POA: Diagnosis not present

## 2017-02-09 DIAGNOSIS — Z91040 Latex allergy status: Secondary | ICD-10-CM | POA: Insufficient documentation

## 2017-02-09 DIAGNOSIS — J449 Chronic obstructive pulmonary disease, unspecified: Secondary | ICD-10-CM | POA: Insufficient documentation

## 2017-02-09 DIAGNOSIS — E1129 Type 2 diabetes mellitus with other diabetic kidney complication: Secondary | ICD-10-CM | POA: Diagnosis not present

## 2017-02-09 DIAGNOSIS — Z01812 Encounter for preprocedural laboratory examination: Secondary | ICD-10-CM | POA: Insufficient documentation

## 2017-02-09 DIAGNOSIS — M1710 Unilateral primary osteoarthritis, unspecified knee: Secondary | ICD-10-CM | POA: Diagnosis not present

## 2017-02-09 DIAGNOSIS — C3492 Malignant neoplasm of unspecified part of left bronchus or lung: Secondary | ICD-10-CM | POA: Diagnosis not present

## 2017-02-09 DIAGNOSIS — I120 Hypertensive chronic kidney disease with stage 5 chronic kidney disease or end stage renal disease: Secondary | ICD-10-CM | POA: Diagnosis not present

## 2017-02-09 DIAGNOSIS — Z87891 Personal history of nicotine dependence: Secondary | ICD-10-CM | POA: Insufficient documentation

## 2017-02-09 DIAGNOSIS — Z91041 Radiographic dye allergy status: Secondary | ICD-10-CM | POA: Diagnosis not present

## 2017-02-09 DIAGNOSIS — Z23 Encounter for immunization: Secondary | ICD-10-CM | POA: Diagnosis not present

## 2017-02-09 DIAGNOSIS — D631 Anemia in chronic kidney disease: Secondary | ICD-10-CM | POA: Diagnosis not present

## 2017-02-09 HISTORY — DX: Malignant (primary) neoplasm, unspecified: C80.1

## 2017-02-09 NOTE — Progress Notes (Signed)
[name]; [id number]; 1945/10/09   HPI Patient is a 71 year old black female who was referred to my care for Port-A-Cath insertion. She was referred by Dr. Talbert Cage of oncology. [name] about to undergo chemotherapy for lung cancer. She has multiple medical problems and is on dialysis Tuesdays, Thursdays, and Saturdays. She will be undergoing chemotherapy next week. She currently has no pain. [name] somewhat wheelchair bound. Past Medical History:  Diagnosis Date  . Arthritis    knee  . Bronchitis   . COPD (chronic obstructive pulmonary disease) ([place])   . Diabetes mellitus   . Diarrhea    not constant- frequent  . ESRD on hemodialysis ([place])    [facility]. Started HD November 26, 2009. 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  Marland Kitchen Family history of anesthesia complication    SON- VOMITS  . Hyperlipidemia   . Hypertension   . Hypothyroidism   . Leg pain   . Peripheral vascular disease ([place])   . 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 Retroperitoneal bleeding 06/2016  . Stroke ([place])   . Thyroid disease     Past Surgical History:  Procedure Laterality Date  . AMPUTATION Left 07/25/2012   Procedure: LEFT AMPUTATION BELOW KNEE;  Surgeon: Newt Minion, MD;  Location: [facility];  Service: Orthopedics;  Laterality: Left;  Left Below Knee Amputation  . AV FISTULA PLACEMENT  05/18/2010  . BASCILIC VEIN TRANSPOSITION Right 11/23/2012   Procedure: RIGHT BASCILIC VEIN TRANSPOSITION ;  Surgeon: Angelia Mould, MD;  Location: [facility];  Service: Vascular;  Laterality: Right;  . CHOLECYSTECTOMY    . DG AV DIALYSIS SHUNT ACCESS EXIST*R* OR     working right HD catheter  . FEMUR IM NAIL  05/20/2012   Procedure: INTRAMEDULLARY (IM) RETROGRADE FEMORAL NAILING;  Surgeon: Rozanna Box, MD;  Location: [facility];  Service: Orthopedics;  Laterality: Left;  . IR GENERIC HISTORICAL  07/07/2016   IR US GUIDE VASC ACCESS LEFT 07/07/2016 Markus Daft, MD [facility]  . IR  GENERIC HISTORICAL  07/07/2016   IR FLUORO GUIDE CV LINE LEFT 07/07/2016 Markus Daft, MD [facility]    Family History  Problem Relation Age of Onset  . Diabetes Mother     Current Outpatient Prescriptions on File Prior to Visit  Medication Sig Dispense Refill  . acetaminophen (TYLENOL) 500 MG tablet Take 2,000 mg by mouth 3 (three) times daily as needed for moderate pain or headache.    Marland Kitchen amLODipine (NORVASC) 5 MG tablet Take 5 mg by mouth daily.    . calcium acetate (PHOSLO) 667 MG capsule Take 1 capsule (667 mg total) by mouth 2 (two) times daily with a meal. (Patient taking differently: Take 1,334 mg by mouth 3 (three) times daily after meals. )    . dexamethasone (DECADRON) 4 MG tablet Take 2 tablets (8 mg total) by mouth daily. Start the day after chemotherapy for 2 days. 30 tablet 1  . insulin glargine (LANTUS) 100 UNIT/ML injection Inject 0.1 mLs (10 Units total) into the skin at bedtime. (Patient taking differently: Inject 20 Units into the skin at bedtime. ) 10 mL 0  . insulin lispro (HUMALOG) 100 UNIT/ML injection Inject 8 Units into the skin daily after breakfast.    . levothyroxine (SYNTHROID, LEVOTHROID) 75 MCG tablet Take 75 mcg by mouth daily.      Marland Kitchen lidocaine-prilocaine (EMLA) cream Apply to affected area once 30 g 3  .  linagliptin (TRADJENTA) 5 MG TABS tablet Take 5 mg by mouth daily.    . metoCLOPramide (REGLAN) 5 MG tablet Take 1-2 tablets (5-10 mg total) by mouth every 8 (eight) hours as needed (if ondansetron (ZOFRAN) ineffective.).    Marland Kitchen Multiple Vitamins-Minerals (RENAL) TABS Take 1 tablet by mouth daily.    . prochlorperazine (COMPAZINE) 10 MG tablet Take 1 tablet (10 mg total) by mouth every 6 (six) hours as needed (Nausea or vomiting). 30 tablet 1   No current facility-administered medications on file prior to visit.     Allergies  Allergen Reactions  . Contrast Media [Iodinated Diagnostic Agents] Itching  . Latex Itching    History  Alcohol Use No     History  Smoking Status  . Former Smoker  . Packs/day: 0.50  . Years: 30.00  . Types: Cigarettes  . Quit date: 04/29/2012  Smokeless Tobacco  . Never Used    Review of Systems  Constitutional: Positive for chills and malaise/fatigue.  HENT: Negative.   Eyes: Positive for blurred vision.  Respiratory: Positive for cough.   Cardiovascular: Negative.   Gastrointestinal: Positive for abdominal pain and nausea.  Genitourinary: Negative.   Musculoskeletal: Positive for neck pain.  Skin: Negative.   Neurological: Negative.   Psychiatric/Behavioral: Negative.     Objective   Vitals:   02/09/17 1115  BP: (!) 120/47  Pulse: 84  Resp: 18  Temp: 98.9 F (37.2 C)    Physical Exam  Constitutional: [name] oriented to person, place, and time.  Wheelchair bound, well-nourished, in no acute distress.  HENT:  Head: Normocephalic and atraumatic.  Cardiovascular: Normal rate, regular rhythm and normal heart sounds.  Exam reveals no gallop and no friction rub.   No murmur heard. Pulmonary/Chest: Effort normal and breath sounds normal. No respiratory distress. She has no wheezes. She has no rales.  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  Skin: Skin is warm and dry.   Dr. Laverle Patter notes reviewed. Assessment  Lung carcinoma, end-stage renal disease, need for central venous access Plan   Patient is scheduled for Port-A-Cath insertion on 02/13/2017. The risks and benefits of the procedure including bleeding, infection, and pneumothorax were fully explained to the patient, who gave informed consent.

## 2017-02-09 NOTE — Patient Instructions (Addendum)
[name]  02/09/2017     @PREFPERIOPPHARMACY @   Your procedure is scheduled on 02/13/2017.  Report to [name] at 11:30 A.M.  Call this number if you have problems the morning of surgery:  [phone]   Remember:  Do not eat food or drink liquids after midnight.  Take these medicines the morning of surgery with A SIP OF WATER Amlodipine, Synthroid, Zofran if needed  Do not take diabetic medications morning of procedure  Take 1/2 dose of insulin evening prior   Do not wear jewelry, make-up or nail polish.  Do not wear lotions, powders, or perfumes, or deoderant.  Do not shave 48 hours prior to surgery.  Men may shave face and neck.  Do not bring valuables to the hospital.  [facility] is not responsible for any belongings or valuables.  Contacts, dentures or bridgework may not be worn into surgery.  Leave your suitcase in the car.  After surgery it may be brought to your room.  For patients admitted to the hospital, discharge time will be determined by your treatment team.  Patients discharged the day of surgery will not be allowed to drive home.    Please read over the following fact sheets that you were given. Surgical Site Infection Prevention and Anesthesia Post-op Instructions     PATIENT INSTRUCTIONS POST-ANESTHESIA  IMMEDIATELY FOLLOWING SURGERY:  Do not drive or operate machinery for the first twenty four hours after surgery.  Do not make any important decisions for twenty four hours after surgery or while taking narcotic pain medications or sedatives.  If you develop intractable nausea and vomiting or a severe headache please notify your doctor immediately.  FOLLOW-UP:  Please make an appointment with your surgeon as instructed. You do not need to follow up with anesthesia unless specifically instructed to do so.  WOUND CARE INSTRUCTIONS (if applicable):  Keep a dry clean dressing on the anesthesia/puncture wound site if there is drainage.  Once the wound  has quit draining you may leave it open to air.  Generally you should leave the bandage intact for twenty four hours unless there is drainage.  If the epidural site drains for more than 36-48 hours please call the anesthesia department.  QUESTIONS?:  Please feel free to call your physician or the hospital operator if you have any questions, and they will be happy to assist you.      Implanted Port Insertion Implanted port insertion is a procedure to put in a port and catheter. The port is a device with an injectable disk that can be accessed by your health care provider. The port is connected to a vein in the chest or neck by a small flexible tube (catheter). There are different types of ports. The implanted port may be used as a long-term IV access for:  Medicines, such as chemotherapy.  Fluids.  Liquid nutrition, such as total parenteral nutrition (TPN).  Blood samples.  Having a port means that your health care provider will not need to use the veins in your arms for these procedures. Tell a health care provider about:  Any allergies you have.  All medicines you are taking, especially blood thinners, as well as any vitamins, herbs, eye drops, creams, over-the-counter medicines, and steroids.  Any problems you or family members have had with anesthetic medicines.  Any blood disorders you have.  Any surgeries you have had.  Any medical conditions you have, including diabetes or kidney problems.  Whether you are  pregnant or may be pregnant. What are the risks? Generally, this is a safe procedure. However, problems may occur, including:  Allergic reactions to medicines or dyes.  Damage to other structures or organs.  Infection.  Damage to the blood vessel, bruising, or bleeding at the puncture site.  Blood clot.  Breakdown of the skin over the port.  A collection of air in the chest that can cause one of the lungs to collapse (pneumothorax). This is rare.  What happens  before the procedure? Staying hydrated Follow instructions from your health care provider about hydration, which may include:  Up to 2 hours before the procedure - you may continue to drink clear liquids, such as water, clear fruit juice, black coffee, and plain tea.  Eating and drinking restrictions  Follow instructions from your health care provider about eating and drinking, which may include: ? 8 hours before the procedure - stop eating heavy meals or foods such as meat, fried foods, or fatty foods. ? 6 hours before the procedure - stop eating light meals or foods, such as toast or cereal. ? 6 hours before the procedure - stop drinking milk or drinks that contain milk. ? 2 hours before the procedure - stop drinking clear liquids. Medicines  Ask your health care provider about: ? Changing or stopping your regular medicines. This is especially important if you are taking diabetes medicines or blood thinners. ? Taking medicines such as aspirin and ibuprofen. These medicines can thin your blood. Do not take these medicines before your procedure if your health care provider instructs you not to.  You may be given antibiotic medicine to help prevent infection. General instructions  Plan to have someone take you home from the hospital or clinic.  If you will be going home right after the procedure, plan to have someone with you for 24 hours.  You may have blood tests.  You may be asked to shower with a germ-killing soap. What happens during the procedure?  To lower your risk of infection: ? Your health care team will wash or sanitize their hands. ? Your skin will be washed with soap. ? Hair may be removed from the surgical area.  An IV tube will be inserted into one of your veins.  You will be given one or more of the following: ? A medicine to help you relax (sedative). ? A medicine to numb the area (local anesthetic).  Two small cuts (incisions) will be made to insert the  port. ? One incision will be made in your neck to get access to the vein where the catheter will lie. ? The other incision will be made in the upper chest. This is where the port will lie.  The procedure may be done using continuous X-ray (fluoroscopy) or other imaging tools for guidance.  The port and catheter will be placed. There may be a small, raised area where the port is.  The port will be flushed with a salt solution (saline), and blood will be drawn to make sure that it is working correctly.  The incisions will be closed.  Bandages (dressings) may be placed over the incisions. The procedure may vary among health care providers and hospitals. What happens after the procedure?  Your blood pressure, heart rate, breathing rate, and blood oxygen level will be monitored until the medicines you were given have worn off.  Do not drive for 24 hours if you were given a sedative.  You will be given a manufacturer's  information card for the type of port that you have. Keep this with you.  Your port will need to be flushed and checked as told by your health care provider, usually every few weeks.  A chest X-ray will be done to: ? Check the placement of the port. ? Make sure there is no injury to your lung. Summary  Implanted port insertion is a procedure to put in a port and catheter.  The implanted port is used as a long-term IV access.  The port will need to be flushed and checked as told by your health care provider, usually every few weeks.  Keep your manufacturer's information card with you at all times. This information is not intended to replace advice given to you by your health care provider. Make sure you discuss any questions you have with your health care provider. Document Released: 03/06/2013 Document Revised: 04/06/2016 Document Reviewed: 04/06/2016 Elsevier Interactive Patient Education  2017 [employer].

## 2017-02-09 NOTE — Patient Instructions (Signed)
Implanted Port Insertion Implanted port insertion is a procedure to put in a port and catheter. The port is a device with an injectable disk that can be accessed by your health care provider. The port is connected to a vein in the chest or neck by a small flexible tube (catheter). There are different types of ports. The implanted port may be used as a long-term IV access for:  Medicines, such as chemotherapy.  Fluids.  Liquid nutrition, such as total parenteral nutrition (TPN).  Blood samples.  Having a port means that your health care provider will not need to use the veins in your arms for these procedures. Tell a health care provider about:  Any allergies you have.  All medicines you are taking, especially blood thinners, as well as any vitamins, herbs, eye drops, creams, over-the-counter medicines, and steroids.  Any problems you or family members have had with anesthetic medicines.  Any blood disorders you have.  Any surgeries you have had.  Any medical conditions you have, including diabetes or kidney problems.  Whether you are pregnant or may be pregnant. What are the risks? Generally, this is a safe procedure. However, problems may occur, including:  Allergic reactions to medicines or dyes.  Damage to other structures or organs.  Infection.  Damage to the blood vessel, bruising, or bleeding at the puncture site.  Blood clot.  Breakdown of the skin over the port.  A collection of air in the chest that can cause one of the lungs to collapse (pneumothorax). This is rare.  What happens before the procedure? Staying hydrated Follow instructions from your health care provider about hydration, which may include:  Up to 2 hours before the procedure - you may continue to drink clear liquids, such as water, clear fruit juice, black coffee, and plain tea.  Eating and drinking restrictions  Follow instructions from your health care provider about eating and drinking,  which may include: ? 8 hours before the procedure - stop eating heavy meals or foods such as meat, fried foods, or fatty foods. ? 6 hours before the procedure - stop eating light meals or foods, such as toast or cereal. ? 6 hours before the procedure - stop drinking milk or drinks that contain milk. ? 2 hours before the procedure - stop drinking clear liquids. Medicines  Ask your health care provider about: ? Changing or stopping your regular medicines. This is especially important if you are taking diabetes medicines or blood thinners. ? Taking medicines such as aspirin and ibuprofen. These medicines can thin your blood. Do not take these medicines before your procedure if your health care provider instructs you not to.  You may be given antibiotic medicine to help prevent infection. General instructions  Plan to have someone take you home from the hospital or clinic.  If you will be going home right after the procedure, plan to have someone with you for 24 hours.  You may have blood tests.  You may be asked to shower with a germ-killing soap. What happens during the procedure?  To lower your risk of infection: ? Your health care team will wash or sanitize their hands. ? Your skin will be washed with soap. ? Hair may be removed from the surgical area.  An IV tube will be inserted into one of your veins.  You will be given one or more of the following: ? A medicine to help you relax (sedative). ? A medicine to numb the area (local anesthetic).    Two small cuts (incisions) will be made to insert the port. ? One incision will be made in your neck to get access to the vein where the catheter will lie. ? The other incision will be made in the upper chest. This is where the port will lie.  The procedure may be done using continuous X-ray (fluoroscopy) or other imaging tools for guidance.  The port and catheter will be placed. There may be a small, raised area where the port  is.  The port will be flushed with a salt solution (saline), and blood will be drawn to make sure that it is working correctly.  The incisions will be closed.  Bandages (dressings) may be placed over the incisions. The procedure may vary among health care providers and hospitals. What happens after the procedure?  Your blood pressure, heart rate, breathing rate, and blood oxygen level will be monitored until the medicines you were given have worn off.  Do not drive for 24 hours if you were given a sedative.  You will be given a manufacturer's information card for the type of port that you have. Keep this with you.  Your port will need to be flushed and checked as told by your health care provider, usually every few weeks.  A chest X-ray will be done to: ? Check the placement of the port. ? Make sure there is no injury to your lung. Summary  Implanted port insertion is a procedure to put in a port and catheter.  The implanted port is used as a long-term IV access.  The port will need to be flushed and checked as told by your health care provider, usually every few weeks.  Keep your manufacturer's information card with you at all times. This information is not intended to replace advice given to you by your health care provider. Make sure you discuss any questions you have with your health care provider. Document Released: 03/06/2013 Document Revised: 04/06/2016 Document Reviewed: 04/06/2016 Elsevier Interactive Patient Education  2017 [employer] Implanted Port Home Guide An implanted port is a type of central line that is placed under the skin. Central lines are used to provide IV access when treatment or nutrition needs to be given through a person's veins. Implanted ports are used for long-term IV access. An implanted port may be placed because:  You need IV medicine that would be irritating to the small veins in your hands or arms.  You need long-term IV medicines, such as  antibiotics.  You need IV nutrition for a long period.  You need frequent blood draws for lab tests.  You need dialysis.  Implanted ports are usually placed in the chest area, but they can also be placed in the upper arm, the abdomen, or the leg. An implanted port has two main parts:  Reservoir. The reservoir is round and will appear as a small, raised area under your skin. The reservoir is the part where a needle is inserted to give medicines or draw blood.  Catheter. The catheter is a thin, flexible tube that extends from the reservoir. The catheter is placed into a large vein. Medicine that is inserted into the reservoir goes into the catheter and then into the vein.  How will I care for my incision site? Do not get the incision site wet. Bathe or shower as directed by your health care provider. How is my port accessed? Special steps must be taken to access the port:  Before the port is accessed,   a numbing cream can be placed on the skin. This helps numb the skin over the port site.  Your health care provider uses a sterile technique to access the port. ? Your health care provider must put on a mask and sterile gloves. ? The skin over your port is cleaned carefully with an antiseptic and allowed to dry. ? The port is gently pinched between sterile gloves, and a needle is inserted into the port.  Only "non-coring" port needles should be used to access the port. Once the port is accessed, a blood return should be checked. This helps ensure that the port is in the vein and is not clogged.  If your port needs to remain accessed for a constant infusion, a clear (transparent) bandage will be placed over the needle site. The bandage and needle will need to be changed every week, or as directed by your health care provider.  Keep the bandage covering the needle clean and dry. Do not get it wet. Follow your health care provider's instructions on how to take a shower or bath while the port is  accessed.  If your port does not need to stay accessed, no bandage is needed over the port.  What is flushing? Flushing helps keep the port from getting clogged. Follow your health care provider's instructions on how and when to flush the port. Ports are usually flushed with saline solution or a medicine called heparin. The need for flushing will depend on how the port is used.  If the port is used for intermittent medicines or blood draws, the port will need to be flushed: ? After medicines have been given. ? After blood has been drawn. ? As part of routine maintenance.  If a constant infusion is running, the port may not need to be flushed.  How long will my port stay implanted? The port can stay in for as long as your health care provider thinks it is needed. When it is time for the port to come out, surgery will be done to remove it. The procedure is similar to the one performed when the port was put in. When should I seek immediate medical care? When you have an implanted port, you should seek immediate medical care if:  You notice a bad smell coming from the incision site.  You have swelling, redness, or drainage at the incision site.  You have more swelling or pain at the port site or the surrounding area.  You have a fever that is not controlled with medicine.  This information is not intended to replace advice given to you by your health care provider. Make sure you discuss any questions you have with your health care provider. Document Released: 05/16/2005 Document Revised: 10/22/2015 Document Reviewed: 01/21/2013 Elsevier Interactive Patient Education  2017 [employer] Inc.  

## 2017-02-09 NOTE — H&P (Signed)
[name]; [id number]; 23-Aug-1945   HPI Patient is a 71 year old black female who was referred to my care for Port-A-Cath insertion. She was referred by Dr. Talbert Cage of oncology. [name] about to undergo chemotherapy for lung cancer. She has multiple medical problems and is on dialysis Tuesdays, Thursdays, and Saturdays. She will be undergoing chemotherapy next week. She currently has no pain. [name] somewhat wheelchair bound. Past Medical History:  Diagnosis Date  . Arthritis    knee  . Bronchitis   . COPD (chronic obstructive pulmonary disease) ([place])   . Diabetes mellitus   . Diarrhea    not constant- frequent  . ESRD on hemodialysis ([place])    [facility]. Started HD November 26, 2009. 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  Marland Kitchen Family history of anesthesia complication    SON- VOMITS  . Hyperlipidemia   . Hypertension   . Hypothyroidism   . Leg pain   . Peripheral vascular disease ([place])   . 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 Retroperitoneal bleeding 06/2016  . Stroke ([place])   . Thyroid disease     Past Surgical History:  Procedure Laterality Date  . AMPUTATION Left 07/25/2012   Procedure: LEFT AMPUTATION BELOW KNEE;  Surgeon: Newt Minion, MD;  Location: [facility];  Service: Orthopedics;  Laterality: Left;  Left Below Knee Amputation  . AV FISTULA PLACEMENT  05/18/2010  . BASCILIC VEIN TRANSPOSITION Right 11/23/2012   Procedure: RIGHT BASCILIC VEIN TRANSPOSITION ;  Surgeon: Angelia Mould, MD;  Location: [place];  Service: Vascular;  Laterality: Right;  . CHOLECYSTECTOMY    . DG AV DIALYSIS SHUNT ACCESS EXIST*R* OR     working right HD catheter  . FEMUR IM NAIL  05/20/2012   Procedure: INTRAMEDULLARY (IM) RETROGRADE FEMORAL NAILING;  Surgeon: Rozanna Box, MD;  Location: [facility];  Service: Orthopedics;  Laterality: Left;  . IR GENERIC HISTORICAL  07/07/2016   IR US GUIDE VASC ACCESS LEFT 07/07/2016 Markus Daft, MD [facility]  . IR  GENERIC HISTORICAL  07/07/2016   IR FLUORO GUIDE CV LINE LEFT 07/07/2016 Markus Daft, MD [facility]    Family History  Problem Relation Age of Onset  . Diabetes Mother     Current Outpatient Prescriptions on File Prior to Visit  Medication Sig Dispense Refill  . acetaminophen (TYLENOL) 500 MG tablet Take 2,000 mg by mouth 3 (three) times daily as needed for moderate pain or headache.    Marland Kitchen amLODipine (NORVASC) 5 MG tablet Take 5 mg by mouth daily.    . calcium acetate (PHOSLO) 667 MG capsule Take 1 capsule (667 mg total) by mouth 2 (two) times daily with a meal. (Patient taking differently: Take 1,334 mg by mouth 3 (three) times daily after meals. )    . dexamethasone (DECADRON) 4 MG tablet Take 2 tablets (8 mg total) by mouth daily. Start the day after chemotherapy for 2 days. 30 tablet 1  . insulin glargine (LANTUS) 100 UNIT/ML injection Inject 0.1 mLs (10 Units total) into the skin at bedtime. (Patient taking differently: Inject 20 Units into the skin at bedtime. ) 10 mL 0  . insulin lispro (HUMALOG) 100 UNIT/ML injection Inject 8 Units into the skin daily after breakfast.    . levothyroxine (SYNTHROID, LEVOTHROID) 75 MCG tablet Take 75 mcg by mouth daily.      Marland Kitchen lidocaine-prilocaine (EMLA) cream Apply to affected area once 30 g 3  .  linagliptin (TRADJENTA) 5 MG TABS tablet Take 5 mg by mouth daily.    . metoCLOPramide (REGLAN) 5 MG tablet Take 1-2 tablets (5-10 mg total) by mouth every 8 (eight) hours as needed (if ondansetron (ZOFRAN) ineffective.).    Marland Kitchen Multiple Vitamins-Minerals (RENAL) TABS Take 1 tablet by mouth daily.    . prochlorperazine (COMPAZINE) 10 MG tablet Take 1 tablet (10 mg total) by mouth every 6 (six) hours as needed (Nausea or vomiting). 30 tablet 1   No current facility-administered medications on file prior to visit.     Allergies  Allergen Reactions  . Contrast Media [Iodinated Diagnostic Agents] Itching  . Latex Itching    History  Alcohol Use No     History  Smoking Status  . Former Smoker  . Packs/day: 0.50  . Years: 30.00  . Types: Cigarettes  . Quit date: 04/29/2012  Smokeless Tobacco  . Never Used    Review of Systems  Constitutional: Positive for chills and malaise/fatigue.  HENT: Negative.   Eyes: Positive for blurred vision.  Respiratory: Positive for cough.   Cardiovascular: Negative.   Gastrointestinal: Positive for abdominal pain and nausea.  Genitourinary: Negative.   Musculoskeletal: Positive for neck pain.  Skin: Negative.   Neurological: Negative.   Psychiatric/Behavioral: Negative.     Objective   Vitals:   02/09/17 1115  BP: (!) 120/47  Pulse: 84  Resp: 18  Temp: 98.9 F (37.2 C)    Physical Exam  Constitutional: [name] oriented to person, place, and time.  Wheelchair bound, well-nourished, in no acute distress.  HENT:  Head: Normocephalic and atraumatic.  Cardiovascular: Normal rate, regular rhythm and normal heart sounds.  Exam reveals no gallop and no friction rub.   No murmur heard. Pulmonary/Chest: Effort normal and breath sounds normal. No respiratory distress. She has no wheezes. She has no rales.  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  Skin: Skin is warm and dry.   Dr. Laverle Patter notes reviewed. Assessment  Lung carcinoma, end-stage renal disease, need for central venous access Plan   Patient is scheduled for Port-A-Cath insertion on 02/13/2017. The risks and benefits of the procedure including bleeding, infection, and pneumothorax were fully explained to the patient, who gave informed consent.

## 2017-02-11 DIAGNOSIS — N2581 Secondary hyperparathyroidism of renal origin: Secondary | ICD-10-CM | POA: Diagnosis not present

## 2017-02-11 DIAGNOSIS — Z23 Encounter for immunization: Secondary | ICD-10-CM | POA: Diagnosis not present

## 2017-02-11 DIAGNOSIS — D631 Anemia in chronic kidney disease: Secondary | ICD-10-CM | POA: Diagnosis not present

## 2017-02-11 DIAGNOSIS — D509 Iron deficiency anemia, unspecified: Secondary | ICD-10-CM | POA: Diagnosis not present

## 2017-02-11 DIAGNOSIS — E1129 Type 2 diabetes mellitus with other diabetic kidney complication: Secondary | ICD-10-CM | POA: Diagnosis not present

## 2017-02-11 DIAGNOSIS — N186 End stage renal disease: Secondary | ICD-10-CM | POA: Diagnosis not present

## 2017-02-13 ENCOUNTER — Ambulatory Visit (HOSPITAL_COMMUNITY)
Admission: RE | Admit: 2017-02-13 | Discharge: 2017-02-13 | Disposition: A | Discharge status: Home / Self Care | Payer: Medicare Other | Source: Ambulatory Visit | Attending: General Surgery | Admitting: General Surgery

## 2017-02-13 ENCOUNTER — Ambulatory Visit (HOSPITAL_COMMUNITY): Payer: Medicare Other

## 2017-02-13 ENCOUNTER — Encounter (HOSPITAL_COMMUNITY): Payer: Self-pay

## 2017-02-13 ENCOUNTER — Ambulatory Visit (HOSPITAL_COMMUNITY): Payer: Medicare Other | Admitting: Anesthesiology

## 2017-02-13 ENCOUNTER — Encounter (HOSPITAL_COMMUNITY): Payer: Self-pay | Admitting: *Deleted

## 2017-02-13 ENCOUNTER — Encounter (HOSPITAL_COMMUNITY)
Admission: RE | Disposition: A | Discharge status: Home / Self Care | Payer: Self-pay | Source: Ambulatory Visit | Attending: General Surgery

## 2017-02-13 DIAGNOSIS — I120 Hypertensive chronic kidney disease with stage 5 chronic kidney disease or end stage renal disease: Secondary | ICD-10-CM | POA: Diagnosis not present

## 2017-02-13 DIAGNOSIS — M1990 Unspecified osteoarthritis, unspecified site: Secondary | ICD-10-CM | POA: Insufficient documentation

## 2017-02-13 DIAGNOSIS — C3492 Malignant neoplasm of unspecified part of left bronchus or lung: Secondary | ICD-10-CM | POA: Diagnosis not present

## 2017-02-13 DIAGNOSIS — E1122 Type 2 diabetes mellitus with diabetic chronic kidney disease: Secondary | ICD-10-CM | POA: Diagnosis not present

## 2017-02-13 DIAGNOSIS — Z79899 Other long term (current) drug therapy: Secondary | ICD-10-CM | POA: Insufficient documentation

## 2017-02-13 DIAGNOSIS — Z8673 Personal history of transient ischemic attack (TIA), and cerebral infarction without residual deficits: Secondary | ICD-10-CM | POA: Diagnosis not present

## 2017-02-13 DIAGNOSIS — E785 Hyperlipidemia, unspecified: Secondary | ICD-10-CM | POA: Insufficient documentation

## 2017-02-13 DIAGNOSIS — Z794 Long term (current) use of insulin: Secondary | ICD-10-CM | POA: Diagnosis not present

## 2017-02-13 DIAGNOSIS — J449 Chronic obstructive pulmonary disease, unspecified: Secondary | ICD-10-CM | POA: Insufficient documentation

## 2017-02-13 DIAGNOSIS — Z87891 Personal history of nicotine dependence: Secondary | ICD-10-CM | POA: Insufficient documentation

## 2017-02-13 DIAGNOSIS — E039 Hypothyroidism, unspecified: Secondary | ICD-10-CM | POA: Diagnosis not present

## 2017-02-13 DIAGNOSIS — Z452 Encounter for adjustment and management of vascular access device: Secondary | ICD-10-CM | POA: Diagnosis not present

## 2017-02-13 DIAGNOSIS — C3490 Malignant neoplasm of unspecified part of unspecified bronchus or lung: Secondary | ICD-10-CM | POA: Insufficient documentation

## 2017-02-13 DIAGNOSIS — Z95828 Presence of other vascular implants and grafts: Secondary | ICD-10-CM

## 2017-02-13 HISTORY — PX: PORTACATH PLACEMENT: SHX2246

## 2017-02-13 LAB — POCT I-STAT, CHEM 8
BUN: 48 mg/dL — ABNORMAL HIGH (ref 6–20)
CALCIUM ION: 1.03 mmol/L — AB (ref 1.15–1.40)
CREATININE: 7.9 mg/dL — AB (ref 0.44–1.00)
Chloride: 97 mmol/L — ABNORMAL LOW (ref 101–111)
GLUCOSE: 92 mg/dL (ref 65–99)
HCT: 29 % — ABNORMAL LOW (ref 36.0–46.0)
HEMOGLOBIN: 9.9 g/dL — AB (ref 12.0–15.0)
Potassium: 4.1 mmol/L (ref 3.5–5.1)
Sodium: 135 mmol/L (ref 135–145)
TCO2: 26 mmol/L (ref 22–32)

## 2017-02-13 LAB — GLUCOSE, CAPILLARY: GLUCOSE-CAPILLARY: 93 mg/dL (ref 65–99)

## 2017-02-13 SURGERY — INSERTION, TUNNELED CENTRAL VENOUS DEVICE, WITH PORT
Anesthesia: Monitor Anesthesia Care | Site: Chest | Laterality: Left

## 2017-02-13 MED ORDER — MIDAZOLAM HCL 2 MG/2ML IJ SOLN
INTRAMUSCULAR | Status: AC
  Filled 2017-02-13: qty 2

## 2017-02-13 MED ORDER — SODIUM CHLORIDE 0.9 % IV SOLN
INTRAVENOUS | Status: DC
  Administered 2017-02-13: 11:00:00 via INTRAVENOUS

## 2017-02-13 MED ORDER — PROPOFOL 500 MG/50ML IV EMUL
INTRAVENOUS | Status: DC | PRN
  Administered 2017-02-13: 50 ug/kg/min via INTRAVENOUS

## 2017-02-13 MED ORDER — HEPARIN SOD (PORK) LOCK FLUSH 100 UNIT/ML IV SOLN
INTRAVENOUS | Status: AC
  Filled 2017-02-13: qty 5

## 2017-02-13 MED ORDER — HEPARIN SOD (PORK) LOCK FLUSH 100 UNIT/ML IV SOLN
INTRAVENOUS | Status: DC | PRN
  Administered 2017-02-13: 500 [IU]

## 2017-02-13 MED ORDER — SODIUM CHLORIDE 0.9 % IV SOLN: INTRAVENOUS | Status: DC | PRN

## 2017-02-13 MED ORDER — SODIUM CHLORIDE 0.9 % IV SOLN
INTRAVENOUS | Status: AC | PRN
  Administered 2017-02-13: 500 mL via INTRAMUSCULAR

## 2017-02-13 MED ORDER — CHLORHEXIDINE GLUCONATE CLOTH 2 % EX PADS: 6.0000 | MEDICATED_PAD | Freq: Once | CUTANEOUS | Status: DC

## 2017-02-13 MED ORDER — LIDOCAINE HCL (PF) 1 % IJ SOLN
INTRAMUSCULAR | Status: DC | PRN
  Administered 2017-02-13: 10 mL

## 2017-02-13 MED ORDER — LIDOCAINE HCL (PF) 1 % IJ SOLN
INTRAMUSCULAR | Status: AC
  Filled 2017-02-13: qty 30

## 2017-02-13 MED ORDER — MIDAZOLAM HCL 2 MG/2ML IJ SOLN
1.0000 mg | INTRAMUSCULAR | Status: AC
  Administered 2017-02-13: 2 mg via INTRAVENOUS

## 2017-02-13 MED ORDER — LACTATED RINGERS IV SOLN: INTRAVENOUS | Status: DC | PRN

## 2017-02-13 MED ORDER — CEFAZOLIN SODIUM-DEXTROSE 2-4 GM/100ML-% IV SOLN
2.0000 g | INTRAVENOUS | Status: AC
  Administered 2017-02-13: 2 g via INTRAVENOUS
  Filled 2017-02-13: qty 100

## 2017-02-13 SURGICAL SUPPLY — 37 items
APPLIER CLIP 9.375 SM OPEN (CLIP)
BAG DECANTER FOR FLEXI CONT (MISCELLANEOUS) ×3 IMPLANT
BAG HAMPER (MISCELLANEOUS) ×3 IMPLANT
CATH HICKMAN DUAL 12.0 (CATHETERS) IMPLANT
CHLORAPREP W/TINT 10.5 ML (MISCELLANEOUS) ×3 IMPLANT
CLIP APPLIE 9.375 SM OPEN (CLIP) IMPLANT
CLOTH BEACON ORANGE TIMEOUT ST (SAFETY) ×3 IMPLANT
COVER LIGHT HANDLE STERIS (MISCELLANEOUS) ×6 IMPLANT
DECANTER SPIKE VIAL GLASS SM (MISCELLANEOUS) ×3 IMPLANT
DERMABOND ADVANCED (GAUZE/BANDAGES/DRESSINGS) ×2
DERMABOND ADVANCED .7 DNX12 (GAUZE/BANDAGES/DRESSINGS) ×1 IMPLANT
DRAPE C-ARM FOLDED MOBILE STRL (DRAPES) ×3 IMPLANT
ELECT REM PT RETURN 9FT ADLT (ELECTROSURGICAL) ×3
ELECTRODE REM PT RTRN 9FT ADLT (ELECTROSURGICAL) ×1 IMPLANT
GLOVE BIOGEL PI IND STRL 6.5 (GLOVE) ×1 IMPLANT
GLOVE BIOGEL PI IND STRL 7.0 (GLOVE) ×3 IMPLANT
GLOVE BIOGEL PI INDICATOR 6.5 (GLOVE) ×2
GLOVE BIOGEL PI INDICATOR 7.0 (GLOVE) ×6
GLOVE SURG SS PI 6.5 STRL IVOR (GLOVE) ×3 IMPLANT
GLOVE SURG SS PI 7.5 STRL IVOR (GLOVE) ×3 IMPLANT
GOWN STRL REUS W/TWL LRG LVL3 (GOWN DISPOSABLE) ×6 IMPLANT
IV NS 500ML (IV SOLUTION) ×2
IV NS 500ML BAXH (IV SOLUTION) ×1 IMPLANT
KIT PORT POWER 8FR ISP MRI (Port) ×3 IMPLANT
KIT ROOM TURNOVER APOR (KITS) ×3 IMPLANT
MANIFOLD NEPTUNE II (INSTRUMENTS) ×3 IMPLANT
NEEDLE HYPO 25X1 1.5 SAFETY (NEEDLE) ×3 IMPLANT
PACK MINOR (CUSTOM PROCEDURE TRAY) ×3 IMPLANT
PAD ARMBOARD 7.5X6 YLW CONV (MISCELLANEOUS) ×3 IMPLANT
SET BASIN LINEN APH (SET/KITS/TRAYS/PACK) ×3 IMPLANT
SHEATH COOK PEEL AWAY SET 8F (SHEATH) IMPLANT
SUT PROLENE 3 0 PS 2 (SUTURE) IMPLANT
SUT VIC AB 3-0 SH 27 (SUTURE) ×2
SUT VIC AB 3-0 SH 27X BRD (SUTURE) ×1 IMPLANT
SUT VIC AB 4-0 PS2 27 (SUTURE) ×3 IMPLANT
SYR 20CC LL (SYRINGE) ×3 IMPLANT
SYR CONTROL 10ML LL (SYRINGE) ×3 IMPLANT

## 2017-02-13 NOTE — Discharge Instructions (Signed)
Implanted Port Home Guide °An implanted port is a type of central line that is placed under the skin. Central lines are used to provide IV access when treatment or nutrition needs to be given through a person’s veins. Implanted ports are used for long-term IV access. An implanted port may be placed because: °· You need IV medicine that would be irritating to the small veins in your hands or arms. °· You need long-term IV medicines, such as antibiotics. °· You need IV nutrition for a long period. °· You need frequent blood draws for lab tests. °· You need dialysis. ° °Implanted ports are usually placed in the chest area, but they can also be placed in the upper arm, the abdomen, or the leg. An implanted port has two main parts: °· Reservoir. The reservoir is round and will appear as a small, raised area under your skin. The reservoir is the part where a needle is inserted to give medicines or draw blood. °· Catheter. The catheter is a thin, flexible tube that extends from the reservoir. The catheter is placed into a large vein. Medicine that is inserted into the reservoir goes into the catheter and then into the vein. ° °How will I care for my incision site? °Do not get the incision site wet. Bathe or shower as directed by your health care provider. °How is my port accessed? °Special steps must be taken to access the port: °· Before the port is accessed, a numbing cream can be placed on the skin. This helps numb the skin over the port site. °· Your health care provider uses a sterile technique to access the port. °? Your health care provider must put on a mask and sterile gloves. °? The skin over your port is cleaned carefully with an antiseptic and allowed to dry. °? The port is gently pinched between sterile gloves, and a needle is inserted into the port. °· Only "non-coring" port needles should be used to access the port. Once the port is accessed, a blood return should be checked. This helps ensure that the port  is in the vein and is not clogged. °· If your port needs to remain accessed for a constant infusion, a clear (transparent) bandage will be placed over the needle site. The bandage and needle will need to be changed every week, or as directed by your health care provider. °· Keep the bandage covering the needle clean and dry. Do not get it wet. Follow your health care provider’s instructions on how to take a shower or bath while the port is accessed. °· If your port does not need to stay accessed, no bandage is needed over the port. ° °What is flushing? °Flushing helps keep the port from getting clogged. Follow your health care provider’s instructions on how and when to flush the port. Ports are usually flushed with saline solution or a medicine called heparin. The need for flushing will depend on how the port is used. °· If the port is used for intermittent medicines or blood draws, the port will need to be flushed: °? After medicines have been given. °? After blood has been drawn. °? As part of routine maintenance. °· If a constant infusion is running, the port may not need to be flushed. ° °How long will my port stay implanted? °The port can stay in for as long as your health care provider thinks it is needed. When it is time for the port to come out, surgery will be   done to remove it. The procedure is similar to the one performed when the port was put in. °When should I seek immediate medical care? °When you have an implanted port, you should seek immediate medical care if: °· You notice a bad smell coming from the incision site. °· You have swelling, redness, or drainage at the incision site. °· You have more swelling or pain at the port site or the surrounding area. °· You have a fever that is not controlled with medicine. ° °This information is not intended to replace advice given to you by your health care provider. Make sure you discuss any questions you have with your health care provider. °Document  Released: 05/16/2005 Document Revised: 10/22/2015 Document Reviewed: 01/21/2013 °Elsevier Interactive Patient Education © 2017 [employer] Inc[employer] °Implanted Port Insertion, Care After °This sheet gives you information about how to care for yourself after your procedure. Your health care provider may also give you more specific instructions. If you have problems or questions, contact your health care provider. °What can I expect after the procedure? °After your procedure, it is common to have: °· Discomfort at the port insertion site. °· Bruising on the skin over the port. This should improve over 3-4 days. ° °Follow these instructions at home: °Port care °· After your port is placed, you will get a manufacturer's information card. The card has information about your port. Keep this card with you at all times. °· Take care of the port as told by your health care provider. Ask your health care provider if you or a family member can get training for taking care of the port at home. A home health care nurse may also take care of the port. °· Make sure to remember what type of port you have. °Incision care °· Follow instructions from your health care provider about how to take care of your port insertion site. Make sure you: °? Wash your hands with soap and water before you change your bandage (dressing). If soap and water are not available, use hand sanitizer. °? Change your dressing as told by your health care provider. °? Leave stitches (sutures), skin glue, or adhesive strips in place. These skin closures may need to stay in place for 2 weeks or longer. If adhesive strip edges start to loosen and curl up, you may trim the loose edges. Do not remove adhesive strips completely unless your health care provider tells you to do that. °· Check your port insertion site every day for signs of infection. Check for: °? More redness, swelling, or pain. °? More fluid or blood. °? Warmth. °? Pus or a bad smell. °General  instructions °· Do not take baths, swim, or use a hot tub until your health care provider approves. °· Do not lift anything that is heavier than 10 lb (4.5 kg) for a week, or as told by your health care provider. °· Ask your health care provider when it is okay to: °? Return to work or school. °? Resume usual physical activities or sports. °· Do not drive for 24 hours if you were given a medicine to help you relax (sedative). °· Take over-the-counter and prescription medicines only as told by your health care provider. °· Wear a medical alert bracelet in case of an emergency. This will tell any health care providers that you have a port. °· Keep all follow-up visits as told by your health care provider. This is important. °Contact a health care provider if: °· You cannot   flush your port with saline as directed, or you cannot draw blood from the port. °· You have a fever or chills. °· You have more redness, swelling, or pain around your port insertion site. °· You have more fluid or blood coming from your port insertion site. °· Your port insertion site feels warm to the touch. °· You have pus or a bad smell coming from the port insertion site. °Get help right away if: °· You have chest pain or shortness of breath. °· You have bleeding from your port that you cannot control. °Summary °· Take care of the port as told by your health care provider. °· Change your dressing as told by your health care provider. °· Keep all follow-up visits as told by your health care provider. °This information is not intended to replace advice given to you by your health care provider. Make sure you discuss any questions you have with your health care provider. °Document Released: 03/06/2013 Document Revised: 04/06/2016 Document Reviewed: 04/06/2016 °Elsevier Interactive Patient Education © 2017 [employer] Inc. ° °

## 2017-02-13 NOTE — Op Note (Signed)
Patient:  [name]  DOB:  June 29, 1945  MRN:  [id number]   Preop Diagnosis:  Lung carcinoma  Postop Diagnosis:  Same  Procedure:  Port-A-Cath insertion  Surgeon:  Aviva Signs, M.D.  Anes:  Mac  Indications:  Patient is a 71 year old black female with multiple medical problems who was referred to my care for central venous access due to the need for chemotherapy for treatment of lung cancer. The risks and benefits of the procedure including bleeding, infection, and pneumothorax were fully explained to the patient, who gave informed consent.  Procedure note:  The patient was placed in the Trendelenburg position after the left upper chest was prepped and draped using the usual sterile technique with DuraPrep. Surgical site confirmation was performed. One percent Xylocaine was used for local anesthesia.  An incision was made below the left clavicle. A subcutaneous pocket was formed. The needles advanced into the left subclavian vein using the Seldinger technique without difficulty. A guidewire was then advanced into the right atrium under fluoroscopic guidance. An introducer and peel-away sheath were placed over the guidewire. The catheter was then inserted through the peel-away sheath and the peel-away sheath was removed. The catheter was then attached to the port and the port placed in subcutaneous pocket. Adequate positioning was confirmed by fluoroscopy. Good backflow of blood was noted on aspiration of the port. The port was flushed with heparin flush. The subcutaneous layer was reapproximated using a 3-0 Vicryl interrupted suture. The skin was closed using a 4-0 Vicryl subcuticular suture. Dermabond was applied.  All tape and needle counts were correct at the end of the procedure. The patient was awakened and transferred to PACU in stable condition. A chest x-ray will be performed at that time.  Complications:  None  EBL:  Minimal  Specimen:  None

## 2017-02-13 NOTE — Interval H&P Note (Signed)
History and Physical Interval Note:  02/13/2017 11:09 AM  [name]  has presented today for surgery, with the diagnosis of lung cancer  The various methods of treatment have been discussed with the patient and family. After consideration of risks, benefits and other options for treatment, the patient has consented to  Procedure(s) with comments: INSERTION PORT-A-CATH (Left) - pt knows to arrive at 10:20 as a surgical intervention .  The patient's history has been reviewed, patient examined, no change in status, stable for surgery.  I have reviewed the patient's chart and labs.  Questions were answered to the patient's satisfaction.     Aviva Signs

## 2017-02-13 NOTE — Anesthesia Preprocedure Evaluation (Addendum)
Anesthesia Evaluation  Patient identified by MRN, date of birth, ID band Patient awake    Reviewed: Allergy & Precautions, H&P , NPO status , Patient's Chart, lab work & pertinent test results  History of Anesthesia Complications (+) Family history of anesthesia reaction  Airway Mallampati: II   Neck ROM: full    Dental  (+) Edentulous Upper   Pulmonary COPD, former smoker,    breath sounds clear to auscultation       Cardiovascular hypertension, + Peripheral Vascular Disease   Rhythm:Regular Rate:Normal     Neuro/Psych CVA    GI/Hepatic   Endo/Other  diabetes, Type 2Hypothyroidism   Renal/GU ESRF and DialysisRenal disease     Musculoskeletal  (+) Arthritis ,   Abdominal   Peds  Hematology  (+) [facility]'S WITNESS  Anesthesia Other Findings   Reproductive/Obstetrics                            Anesthesia Physical Anesthesia Plan  ASA: III  Anesthesia Plan: MAC   Post-op Pain Management:    Induction: Intravenous  PONV Risk Score and Plan:   Airway Management Planned: Simple Face Mask  Additional Equipment:   Intra-op Plan:   Post-operative Plan:   Informed Consent: I have reviewed the patients History and Physical, chart, labs and discussed the procedure including the risks, benefits and alternatives for the proposed anesthesia with the patient or authorized representative who has indicated his/her understanding and acceptance.     Plan Discussed with:   Anesthesia Plan Comments:         Anesthesia Quick Evaluation

## 2017-02-13 NOTE — Anesthesia Postprocedure Evaluation (Signed)
Anesthesia Post Note  Patient: [name]  Procedure(s) Performed: Procedure(s) (LRB): INSERTION PORT-A-CATH LEFT SUBCLAVIAN (Left)  Patient location during evaluation: PACU Anesthesia Type: MAC Level of consciousness: awake and alert, oriented and patient cooperative Pain management: pain level controlled Vital Signs Assessment: post-procedure vital signs reviewed and stable Respiratory status: spontaneous breathing and patient connected to face mask oxygen Cardiovascular status: stable Postop Assessment: no apparent nausea or vomiting Anesthetic complications: no     Last Vitals:  Vitals:   02/13/17 1043 02/13/17 1127  BP:  (!) 96/45  Resp:  (!) 30  Temp: 37.4 C   SpO2:  99%    Last Pain:  Vitals:   02/13/17 1043  TempSrc: Oral  PainSc: 0-No pain                 ADAMS, AMY A

## 2017-02-13 NOTE — Transfer of Care (Signed)
Immediate Anesthesia Transfer of Care Note  Patient: [name]  Procedure(s) Performed: Procedure(s): INSERTION PORT-A-CATH LEFT SUBCLAVIAN (Left)  Patient Location: PACU  Anesthesia Type:MAC  Level of Consciousness: awake, alert , oriented and patient cooperative  Airway & Oxygen Therapy: Patient Spontanous Breathing and Patient connected to face mask oxygen  Post-op Assessment: Report given to RN and Post -op Vital signs reviewed and stable  Post vital signs: Reviewed and stable  Last Vitals:  Vitals:   02/13/17 1043 02/13/17 1127  BP:  (!) 96/45  Resp:  (!) 30  Temp: 37.4 C   SpO2:  99%    Last Pain:  Vitals:   02/13/17 1043  TempSrc: Oral  PainSc: 0-No pain      Patients Stated Pain Goal: 7 (29/19/16 6060)  Complications: No apparent anesthesia complications

## 2017-02-13 NOTE — Anesthesia Procedure Notes (Signed)
Procedure Name: MAC Date/Time: 02/13/2017 11:28 AM Performed by: Andree Elk, AMY A Pre-anesthesia Checklist: Patient identified, Timeout performed, Emergency Drugs available, Suction available and Patient being monitored Oxygen Delivery Method: Simple face mask

## 2017-02-14 ENCOUNTER — Telehealth (HOSPITAL_COMMUNITY): Payer: Self-pay | Admitting: Emergency Medicine

## 2017-02-14 ENCOUNTER — Encounter (HOSPITAL_COMMUNITY): Payer: Self-pay | Admitting: General Surgery

## 2017-02-14 DIAGNOSIS — E1129 Type 2 diabetes mellitus with other diabetic kidney complication: Secondary | ICD-10-CM | POA: Diagnosis not present

## 2017-02-14 DIAGNOSIS — N186 End stage renal disease: Secondary | ICD-10-CM | POA: Diagnosis not present

## 2017-02-14 DIAGNOSIS — D631 Anemia in chronic kidney disease: Secondary | ICD-10-CM | POA: Diagnosis not present

## 2017-02-14 DIAGNOSIS — Z23 Encounter for immunization: Secondary | ICD-10-CM | POA: Diagnosis not present

## 2017-02-14 DIAGNOSIS — N2581 Secondary hyperparathyroidism of renal origin: Secondary | ICD-10-CM | POA: Diagnosis not present

## 2017-02-14 DIAGNOSIS — D509 Iron deficiency anemia, unspecified: Secondary | ICD-10-CM | POA: Diagnosis not present

## 2017-02-14 NOTE — Telephone Encounter (Signed)
Called pt to let her know to bring the medication that we had called in to go along with chemo.  We would go over everything tomorrow in teaching.  She verbalized understanding.

## 2017-02-15 ENCOUNTER — Encounter (HOSPITAL_BASED_OUTPATIENT_CLINIC_OR_DEPARTMENT_OTHER): Payer: Medicare Other

## 2017-02-15 ENCOUNTER — Encounter (HOSPITAL_COMMUNITY): Payer: Self-pay

## 2017-02-15 ENCOUNTER — Encounter (HOSPITAL_COMMUNITY): Payer: Medicare Other

## 2017-02-15 VITALS — BP 128/46 | HR 73 | Temp 98.9°F | Resp 18

## 2017-02-15 DIAGNOSIS — C772 Secondary and unspecified malignant neoplasm of intra-abdominal lymph nodes: Secondary | ICD-10-CM

## 2017-02-15 DIAGNOSIS — Z5111 Encounter for antineoplastic chemotherapy: Secondary | ICD-10-CM | POA: Diagnosis present

## 2017-02-15 DIAGNOSIS — C787 Secondary malignant neoplasm of liver and intrahepatic bile duct: Secondary | ICD-10-CM | POA: Diagnosis not present

## 2017-02-15 DIAGNOSIS — C3412 Malignant neoplasm of upper lobe, left bronchus or lung: Secondary | ICD-10-CM | POA: Diagnosis not present

## 2017-02-15 DIAGNOSIS — C3492 Malignant neoplasm of unspecified part of left bronchus or lung: Secondary | ICD-10-CM | POA: Insufficient documentation

## 2017-02-15 DIAGNOSIS — E876 Hypokalemia: Secondary | ICD-10-CM

## 2017-02-15 LAB — COMPREHENSIVE METABOLIC PANEL
ALBUMIN: 2.3 g/dL — AB (ref 3.5–5.0)
ALT: 11 U/L — ABNORMAL LOW (ref 14–54)
AST: 98 U/L — AB (ref 15–41)
Alkaline Phosphatase: 215 U/L — ABNORMAL HIGH (ref 38–126)
Anion gap: 13 (ref 5–15)
BILIRUBIN TOTAL: 0.7 mg/dL (ref 0.3–1.2)
BUN: 26 mg/dL — AB (ref 6–20)
CO2: 27 mmol/L (ref 22–32)
Calcium: 7.8 mg/dL — ABNORMAL LOW (ref 8.9–10.3)
Chloride: 94 mmol/L — ABNORMAL LOW (ref 101–111)
Creatinine, Ser: 5.31 mg/dL — ABNORMAL HIGH (ref 0.44–1.00)
GFR calc Af Amer: 9 mL/min — ABNORMAL LOW (ref >60)
GFR calc non Af Amer: 7 mL/min — ABNORMAL LOW (ref >60)
Glucose, Bld: 67 mg/dL (ref 65–99)
POTASSIUM: 3.3 mmol/L — AB (ref 3.5–5.1)
Sodium: 134 mmol/L — ABNORMAL LOW (ref 135–145)
TOTAL PROTEIN: 6.9 g/dL (ref 6.5–8.1)

## 2017-02-15 LAB — CBC WITH DIFFERENTIAL/PLATELET
BASOS ABS: 0 10*3/uL (ref 0.0–0.1)
BASOS PCT: 0 %
Eosinophils Absolute: 0 10*3/uL (ref 0.0–0.7)
Eosinophils Relative: 0 %
HEMATOCRIT: 29 % — AB (ref 36.0–46.0)
HEMOGLOBIN: 8.6 g/dL — AB (ref 12.0–15.0)
Lymphocytes Relative: 5 %
Lymphs Abs: 0.5 10*3/uL — ABNORMAL LOW (ref 0.7–4.0)
MCH: 25 pg — ABNORMAL LOW (ref 26.0–34.0)
MCHC: 29.7 g/dL — ABNORMAL LOW (ref 30.0–36.0)
MCV: 84.3 fL (ref 78.0–100.0)
MONO ABS: 1.2 10*3/uL — AB (ref 0.1–1.0)
Monocytes Relative: 11 %
NEUTROS ABS: 8.6 10*3/uL — AB (ref 1.7–7.7)
Neutrophils Relative %: 84 %
Platelets: 261 10*3/uL (ref 150–400)
RBC: 3.44 MIL/uL — AB (ref 3.87–5.11)
RDW: 17.4 % — AB (ref 11.5–15.5)
WBC: 10.3 10*3/uL (ref 4.0–10.5)

## 2017-02-15 MED ORDER — FAMOTIDINE IN NACL 20-0.9 MG/50ML-% IV SOLN
20.0000 mg | Freq: Once | INTRAVENOUS | Status: AC
  Administered 2017-02-15: 20 mg via INTRAVENOUS
  Filled 2017-02-15: qty 50

## 2017-02-15 MED ORDER — DIPHENHYDRAMINE HCL 50 MG/ML IJ SOLN
50.0000 mg | Freq: Once | INTRAMUSCULAR | Status: AC
  Administered 2017-02-15: 50 mg via INTRAVENOUS
  Filled 2017-02-15: qty 1

## 2017-02-15 MED ORDER — HEPARIN SOD (PORK) LOCK FLUSH 100 UNIT/ML IV SOLN
500.0000 [IU] | Freq: Once | INTRAVENOUS | Status: AC | PRN
  Administered 2017-02-15: 500 [IU]
  Filled 2017-02-15: qty 5

## 2017-02-15 MED ORDER — SODIUM CHLORIDE 0.9% FLUSH
10.0000 mL | INTRAVENOUS | Status: DC | PRN
  Administered 2017-02-15: 10 mL
  Filled 2017-02-15: qty 10

## 2017-02-15 MED ORDER — CARBOPLATIN CHEMO INJECTION 450 MG/45ML
185.0000 mg | Freq: Once | INTRAVENOUS | Status: AC
  Administered 2017-02-15: 190 mg via INTRAVENOUS
  Filled 2017-02-15: qty 19

## 2017-02-15 MED ORDER — POTASSIUM CHLORIDE CRYS ER 20 MEQ PO TBCR
40.0000 meq | EXTENDED_RELEASE_TABLET | Freq: Once | ORAL | Status: AC
  Administered 2017-02-15: 40 meq via ORAL
  Filled 2017-02-15: qty 2

## 2017-02-15 MED ORDER — PEGFILGRASTIM 6 MG/0.6ML ~~LOC~~ PSKT
6.0000 mg | PREFILLED_SYRINGE | Freq: Once | SUBCUTANEOUS | Status: AC
  Administered 2017-02-15: 6 mg via SUBCUTANEOUS
  Filled 2017-02-15: qty 0.6

## 2017-02-15 MED ORDER — PALONOSETRON HCL INJECTION 0.25 MG/5ML
0.2500 mg | Freq: Once | INTRAVENOUS | Status: AC
  Administered 2017-02-15: 0.25 mg via INTRAVENOUS
  Filled 2017-02-15: qty 5

## 2017-02-15 MED ORDER — SODIUM CHLORIDE 0.9 % IV SOLN
Freq: Once | INTRAVENOUS | Status: AC
  Administered 2017-02-15: 10:00:00 via INTRAVENOUS

## 2017-02-15 MED ORDER — PACLITAXEL CHEMO INJECTION 300 MG/50ML
175.0000 mg/m2 | Freq: Once | INTRAVENOUS | Status: AC
  Administered 2017-02-15: 330 mg via INTRAVENOUS
  Filled 2017-02-15: qty 55

## 2017-02-15 MED ORDER — DEXAMETHASONE SODIUM PHOSPHATE 100 MG/10ML IJ SOLN
20.0000 mg | Freq: Once | INTRAMUSCULAR | Status: AC
  Administered 2017-02-15: 20 mg via INTRAVENOUS
  Filled 2017-02-15: qty 2

## 2017-02-15 NOTE — Patient Instructions (Signed)
[facility] Discharge Instructions for Patients Receiving Chemotherapy   Beginning January 23rd 2017 lab work for the Cancer Center will be done in the  Main lab at [facility] on 1st floor. If you have a lab appointment with the Cancer Center please come in thru the  Main Entrance and check in at the main information desk   Today you received the following chemotherapy agents Taxol and Carboplatin as well as Neulasta on-pro. Follow-up as scheduled. Call clinic for any questions or concerns  To help prevent nausea and vomiting after your treatment, we encourage you to take your nausea medication   If you develop nausea and vomiting, or diarrhea that is not controlled by your medication, call the clinic.  The clinic phone number is [phone]. Office hours are Monday-Friday 8:30am-5:00pm.  BELOW ARE SYMPTOMS THAT SHOULD BE REPORTED IMMEDIATELY:  *FEVER GREATER THAN 101.0 F  *CHILLS WITH OR WITHOUT FEVER  NAUSEA AND VOMITING THAT IS NOT CONTROLLED WITH YOUR NAUSEA MEDICATION  *UNUSUAL SHORTNESS OF BREATH  *UNUSUAL BRUISING OR BLEEDING  TENDERNESS IN MOUTH AND THROAT WITH OR WITHOUT PRESENCE OF ULCERS  *URINARY PROBLEMS  *BOWEL PROBLEMS  UNUSUAL RASH Items with * indicate a potential emergency and should be followed up as soon as possible. If you have an emergency after office hours please contact your primary care physician or go to the nearest emergency department.  Please call the clinic during office hours if you have any questions or concerns.   You may also contact the Patient Navigator at [phone] should you have any questions or need assistance in obtaining follow up care.      Resources For Cancer Patients and their Caregivers ? [employer]: Can assist with transportation, wigs, general needs, runs Look Good Feel Better.        1-888-227-6333 ? Cancer Care: Provides financial assistance, online support groups,  medication/co-pay assistance.  1-800-813-HOPE (4673) ? [facility] Assists [place] cancer patients and their families through emotional , educational and financial support.  336-427-4357 ? [place] DSS Where to apply for food stamps, Medicaid and utility assistance. 336-342-1394 ? [facility]: Transportation to medical appointments. 336-347-2287 ? Social Security Administration: May apply for disability if have a Stage IV cancer. 336-342-7796 1-800-772-1213 ? [place] [facility]: Assists with nutrition, care and transit needs. 336-349-2343         

## 2017-02-15 NOTE — Progress Notes (Signed)
1000 Labs reviewed with Dr. Talbert Cage and pt approved for chemo tx today along with Potassium 40 meq PO as well.                                                                                      [name] tolerated chemo tx well without complaints or incident. VSS upon discharge. Pt discharged with Neulasta on-pro applied to pt's left arm with green indicator light flashing. Instructions regarding Neulasta and when to remove it was given to pt and her grandson with understanding verbalized. Pt discharged via wheelchair in satisfactory condition accompanied by her grandson

## 2017-02-15 NOTE — Patient Instructions (Signed)
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  We are going to treat you with carboplatin and taxol.  You will have 6 cycles.  1 cycle is 3 weeks.  This treatment is with palliative intent, which means you are treatable but not curable.   You will see the doctor regularly throughout treatment.  We monitor your lab work prior to every treatment.  The doctor monitors your response to treatment by the way you are feeling, your blood work, and scans periodically.  There are wait times while you are here for treatment.  It will take about 30 minutes to 1 hour for your lab work to result.  There will be wait times while pharmacy mixes your medication.    You will have the following premedications prior to receiving chemotherapy: Premeds: Benadryl:  Help prevent a reaction to the chemotherapy.  Pepcid: antihistamine premed Aloxi - high powered nausea/vomiting prevention medication used for chemotherapy patients.  Dexamethasone - steroid - given to reduce the risk of you having an allergic type reaction to the chemotherapy. Dex can cause you to feel energized, nervous/anxious/jittery, make you have trouble sleeping, and/or make you feel hot/flushed in the face/neck and/or look pink/red in the face/neck. These side effects will pass as the Dex wears off. (takes 20 minutes to infuse)  POTENTIAL SIDE EFFECTS OF TREATMENT:  Carboplatin (Generic Name) Other Names: Paraplatin, CBDCA  About This Drug Carboplatin is a drug used to treat cancer. This drug is given in the vein (IV). This drug takes about 30 minutes to infuse.   Possible Side Effects (More Common) . Nausea and throwing up (vomiting). These symptoms may happen within a few hours after your treatment and may last up to 24 hours. Medicines are available to stop or lessen these side effects. . Bone marrow depression. This is a decrease in the number  of white blood cells, red blood cells, and platelets. This may raise your risk of infection, make you tired and weak (fatigue), and raise your risk of bleeding. . Soreness of the mouth and throat. You may have red areas, white patches, or sores that hurt. . This drug may affect how your kidneys work. Your kidney function will be checked as needed. . Electrolyte changes. Your blood will be checked for electrolyte changes as needed.  Side Effects (Less Common) . Hair loss. Some patients lose their hair on the scalp and body. You may notice your hair thinning seven to 14 days after getting this drug. . Effects on the nerves are called peripheral neuropathy. You may feel numbness, tingling, or pain in your hands and feet. It may be hard for you to button your clothes, open jars, or walk as usual. The effect on the nerves may get worse with more doses of the drug. These effects get better in some people after the drug is stopped but it does not get better in all people. . Loose bowel movements (diarrhea) that may last for several days . Decreased hearing or ringing in the ears . Changes in the way food and drinks taste . Changes in liver function. Your liver function will be checked as needed.  Allergic Reactions Serious allergic reactions including anaphylaxis are rare. While you are getting this drug in your vein (IV), tell your nurse right away if you have any of these symptoms of an allergic reaction: . Trouble catching your breath . Feeling  like your tongue or throat are swelling . Feeling your heart beat quickly or in a not normal way (palpitations) . Feeling dizzy or lightheaded . Flushing, itching, rash, and/or hives  Treating Side Effects . Drink 6-8 cups of fluids each day unless your doctor has told you to limit your fluid intake due to some other health problem. A cup is 8 ounces of fluid. If you throw up or have loose bowel movements, you should drink more fluids so that you do not  become dehydrated (lack water in the body from losing too much fluid). . Mouth care is very important. Your mouth care should consist of routine, gentle cleaning of your teeth or dentures and rinsing your mouth with a mixture of 1/2 teaspoon of salt in 8 ounces of water or  teaspoon of baking soda in 8 ounces of water. This should be done at least after each meal and at bedtime. . If you have mouth sores, avoid mouthwash that has alcohol. Avoid alcohol and smoking because they can bother your mouth and throat. . If you have numbness and tingling in your hands and feet, be careful when cooking, walking, and handling sharp objects and hot liquids. . Talk with your nurse about getting a wig before you lose your hair. Also, call the [facility] at 800-ACS-2345 to find out information about the "Look Good, Feel Better" program close to where you live. It is a free program where women getting chemotherapy can learn about wigs, turbans and scarves as well as makeup techniques and skin and nail care.  Food and Drug Interactions There are no known interactions of carboplatin with food. This drug may interact with other medicines. Tell your doctor and pharmacist about all the medicines and dietary supplements (vitamins, minerals, herbs and others) that you are taking at this time. The safety and use of dietary supplements and alternative diets are often not known. Using these might affect your cancer or interfere with your treatment. Until more is known, you should not use dietary supplements or alternative diets without your doctor's help.  When to Call the Doctor Call your doctor or nurse right away if you have any of these symptoms: . Fever of 100.5 F (38 C) or above; chills . Bleeding or bruising that is not normal . Wheezing or trouble breathing . Nausea that stops you from eating or drinking . Throwing up more than once a day . Rash or itching . Loose bowel movements (diarrhea) more than  four times a day or diarrhea with weakness or feeling lightheaded . Call your doctor or nurse as soon as possible if any of these symptoms happen: . Numbness, tingling, decreased feeling or weakness in fingers, toes, arms, or legs . Change in hearing, ringing in the ears . Blurred vision or other changes in eyesight . Decreased urine . Yellowing of skin or eyes  Problems and Reproductive Concerns Sexual problems and reproduction concerns may happen. In both men and women, this drug may affect your ability to have children. This cannot be determined before your treatment. Talk with your doctor or nurse if you plan to have children. Ask for information on sperm or egg banking. In men, this drug may interfere with your ability to make sperm, but it should not change your ability to have sexual relations. In women, menstrual bleeding may become irregular or stop while you are getting this drug. Do not assume that you cannot become pregnant if you do not have a menstrual  period. Women may go through signs of menopause (change of life) like vaginal dryness or itching. Vaginal lubricants can be used to lessen vaginal dryness, itching, and pain during sexual relations. Genetic counseling is available for you to talk about the effects of this drug therapy on future pregnancies. Also, a genetic counselor can look at the possible risk of problems in the unborn baby due to this medicine if an exposure happens during pregnancy. . Pregnancy warning: This drug may have harmful effects on the unborn child, so effective methods of birth control should be used during your cancer treatment. . Breast feeding warning: It is not known if this drug passes into breast milk. For this reason, women should talk to their doctor about the risks and benefits of breast feeding during treatment with this drug because this drug may enter the breast milk and badly harm a breast feeding baby.   Paclitaxel (Taxol)  Paclitaxel is a  drug used to treat cancer. It is given in the vein (IV).  This will take 3 hours to infuse.   Possible Side Effects . Hair loss. Hair loss is often temporary, although with certain medicine, hair loss can sometimes be permanent. Hair loss may happen suddenly or gradually. If you lose hair, you may lose it from your head, face, armpits, pubic area, chest, and/or legs. You may also notice your hair getting thin. . Swelling of your legs, ankles and/or feet (edema) . Flushing . Nausea and throwing up (vomiting) . Loose bowel movements (diarrhea) . Bone marrow depression. This is a decrease in the number of white blood cells, red blood cells, and platelets. This may raise your risk of infection, make you tired and weak (fatigue), and raise your risk of bleeding. . Effects on the nerves are called peripheral neuropathy. You may feel numbness, tingling, or pain in your hands and feet. It may be hard for you to button your clothes, open jars, or walk as usual. The effect on the nerves may get worse with more doses of the drug. These effects get better in some people after the drug is stopped but it does not get better in all people. . Changes in your liver function . Bone, joint and muscle pain . Abnormal EKG . Allergic reaction: Allergic reactions, including anaphylaxis are rare but may happen in some patients. Signs of allergic reaction to this drug may be swelling of the face, feeling like your tongue or throat are swelling, trouble breathing, rash, itching, fever, chills, feeling dizzy, and/or feeling that your heart is beating in a fast or not normal way. If this happens, do not take another dose of this drug. You should get urgent medical treatment. . Infection . Changes in your kidney function. Note: Each of the side effects above was reported in 20% or greater of patients treated with paclitaxel. Not all possible side effects are included above. Warnings and Precautions . Severe bone marrow  depression  Side Effects . To help with hair loss, wash with a mild shampoo and avoid washing your hair every day. . Avoid rubbing your scalp, instead, pat your hair or scalp dry . Avoid coloring your hair . Limit your use of hair spray, electric curlers, blow dryers, and curling irons. . If you are interested in getting a wig, talk to your nurse. You can also call the [facility] at 800-ACS-2345 to find out information about the "Look Good, Feel Better" program close to where you live. It is a free program  where women getting chemotherapy can learn about wigs, turbans and scarves as well as makeup techniques and skin and nail care. . Ask your doctor or nurse about medicines that are available to help stop or lessen diarrhea and/or nausea. . To help with nausea and vomiting, eat small, frequent meals instead of three large meals a day. Choose foods and drinks that are at room temperature. Ask your nurse or doctor about other helpful tips and medicine that is available to help or stop lessen these symptoms. . If you get diarrhea, eat low-fiber foods that are high in protein and calories and avoid foods that can irritate your digestive tracts or lead to cramping. Ask your nurse or doctor about medicine that can lessen or stop your diarrhea. . Mouth care is very important. Your mouth care should consist of routine, gentle cleaning of your teeth or dentures and rinsing your mouth with a mixture of 1/2 teaspoon of salt in 8 ounces of water or  teaspoon of baking soda in 8 ounces of water. This should be done at least after each meal and at bedtime. . If you have mouth sores, avoid mouthwash that has alcohol. Also avoid alcohol and smoking because they can bother your mouth and throat. . Drink plenty of fluids (a minimum of eight glasses per day is recommended). . Take your temperature as your doctor or nurse tells you, and whenever you feel like you may have a fever. . Talk to your doctor or  nurse about precautions you can take to avoid infections and bleeding. . Be careful when cooking, walking, and handling sharp objects and hot liquids.  Food and Drug Interactions . There are no known interactions of paclitaxel with food. . This drug may interact with other medicines. Tell your doctor and pharmacist about all the medicines and dietary supplements (vitamins, minerals, herbs and others) that you are taking at this time. . The safety and use of dietary supplements and alternative diets are often not known. Using these might affect your cancer or interfere with your treatment. Until more is known, you should not use dietary supplements or alternative diets without your cancer doctor's help.  When to Call the Doctor Call your doctor or nurse if you have any of the following symptoms and/or any new or unusual symptoms: . Fever of 100.5 F (38 C) or above . Chills . Redness, pain, warmth, or swelling at the IV site during the infusion . Signs of allergic reaction: swelling of the face, feeling like your tongue or throat are swelling, trouble breathing, rash, itching, fever, chills, feeling dizzy, and/or feeling that your heart is beating in a fast or not normal way . Feeling that your heart is beating in a fast or not normal way (palpitations) . Weight gain of 5 pounds in one week (fluid retention) . Decreased urine or very dark urine . Signs of liver problems: dark urine, pale bowel movements, bad stomach pain, feeling very tired and weak, unusual itching, or yellowing of the eyes or skin . Heavy menstrual period that lasts longer than normal . Easy bruising or bleeding . Nausea that stops you from eating or drinking, and/or that is not relieved by prescribed medicines. . Loose bowel movements (diarrhea) more than 4 times a day or diarrhea with weakness or lightheadedness . Pain in your mouth or throat that makes it hard to eat or drink . Lasting loss of appetite or rapid weight loss  of five pounds in a week . Signs  of peripheral neuropathy: numbness, tingling, or decreased feeling in fingers or toes; trouble walking or changes in the way you walk; or feeling clumsy when buttoning clothes, opening jars, or other routine activities . Joint and muscle pain that is not relieved by prescribed medicines . Extreme fatigue that interferes with normal activities . While you are getting this drug, please tell your nurse right away if you have any pain, redness, or swelling at the site of the IV infusion. . If you think you are pregnant.  Reproduction Warnings . Pregnancy warning: This drug may have harmful effects on the unborn child, it is recommended that effective methods of birth control should be used during your cancer treatment. Let your doctor know right away if you think you may be pregnant. . Breast feeding warning: Women should not breast feed during treatment because this drug could enter the breast milk and cause harm to a breast feeding baby.     SELF CARE ACTIVITIES WHILE ON CHEMOTHERAPY: Hydration Increase your fluid intake 48 hours prior to treatment and drink at least 8 to 12 cups (64 ounces) of water/decaff beverages per day after treatment. You can still have your cup of coffee or soda but these beverages do not count as part of your 8 to 12 cups that you need to drink daily. No alcohol intake.  Medications Continue taking your normal prescription medication as prescribed.  If you start any new herbal or new supplements please let us know first to make sure it is safe.  Mouth Care Have teeth cleaned professionally before starting treatment. Keep dentures and partial plates clean. Use soft toothbrush and do not use mouthwashes that contain alcohol. Biotene is a good mouthwash that is available at most pharmacies or may be ordered by calling [phone]. Use warm salt water gargles (1 teaspoon salt per 1 quart warm water) before and after meals and at bedtime.  Or you may rinse with 2 tablespoons of three-percent hydrogen peroxide mixed in eight ounces of water. If you are still having problems with your mouth or sores in your mouth please call the clinic. If you need dental work, please let the doctor know before you go for your appointment so that we can coordinate the best possible time for you in regards to your chemo regimen. You need to also let your dentist know that you are actively taking chemo. We may need to do labs prior to your dental appointment.   Skin Care Always use sunscreen that has not expired and with SPF (Sun Protection Factor) of 50 or higher. Wear hats to protect your head from the sun. Remember to use sunscreen on your hands, ears, face, & feet.  Use good moisturizing lotions such as udder cream, eucerin, or even Vaseline. Some chemotherapies can cause dry skin, color changes in your skin and nails.    . Avoid long, hot showers or baths. . Use gentle, fragrance-free soaps and laundry detergent. . Use moisturizers, preferably creams or ointments rather than lotions because the thicker consistency is better at preventing skin dehydration. Apply the cream or ointment within 15 minutes of showering. Reapply moisturizer at night, and moisturize your hands every time after you wash them.  Hair Loss (if your doctor says your hair will fall out)  . If your doctor says that your hair is likely to fall out, decide before you begin chemo whether you want to wear a wig. You may want to shop before treatment to match your hair color. Marland Kitchen  Hats, turbans, and scarves can also camouflage hair loss, although some people prefer to leave their heads uncovered. If you go bare-headed outdoors, be sure to use sunscreen on your scalp. . Cut your hair short. It eases the inconvenience of shedding lots of hair, but it also can reduce the emotional impact of watching your hair fall out. . Don't perm or color your hair during chemotherapy. Those chemical  treatments are already damaging to hair and can enhance hair loss. Once your chemo treatments are done and your hair has grown back, it's OK to resume dyeing or perming hair. With chemotherapy, hair loss is almost always temporary. But when it grows back, it may be a different color or texture. In older adults who still had hair color before chemotherapy, the new growth may be completely gray.  Often, new hair is very fine and soft.  Infection Prevention Please wash your hands for at least 30 seconds using warm soapy water. Handwashing is the #1 way to prevent the spread of germs. Stay away from sick people or people who are getting over a cold. If you develop respiratory systems such as green/yellow mucus production or productive cough or persistent cough let us know and we will see if you need an antibiotic. It is a good idea to keep a pair of gloves on when going into grocery stores/[employer] to decrease your risk of coming into contact with germs on the carts, etc. Carry alcohol hand gel with you at all times and use it frequently if out in public. If your temperature reaches 100.5 or higher please call the clinic and let us know.  If it is after hours or on the weekend please go to the ER if your temperature is over 100.5.  Please have your own personal thermometer at home to use.    Sex and bodily fluids If you are going to have sex, a condom must be used to protect the person that isn't taking chemotherapy. Chemo can decrease your libido (sex drive). For a few days after chemotherapy, chemotherapy can be excreted through your bodily fluids.  When using the toilet please close the lid and flush the toilet twice.  Do this for a few day after you have had chemotherapy.   Effects of chemotherapy on your sex life Some changes are simple and won't last long. They won't affect your sex life permanently. Sometimes you may feel: . too tired . not strong enough to be very active . sick or sore  . not in  the mood . anxious or low Your anxiety might not seem related to sex. For example, you may be worried about the cancer and how your treatment is going. Or you may be worried about money, or about how you family are coping with your illness. These things can cause stress, which can affect your interest in sex. It's important to talk to your partner about how you feel. Remember - the changes to your sex life don't usually last long. There's usually no medical reason to stop having sex during chemo. The drugs won't have any long term physical effects on your performance or enjoyment of sex. Cancer can't be passed on to your partner during sex  Contraception It's important to use reliable contraception during treatment. Avoid getting pregnant while you or your partner are having chemotherapy. This is because the drugs may harm the baby. Sometimes chemotherapy drugs can leave a man or woman infertile.  This means you would not be able  to have children in the future. You might want to talk to someone about permanent infertility. It can be very difficult to learn that you may no longer be able to have children. Some people find counselling helpful. There might be ways to preserve your fertility, although this is easier for men than for women. You may want to speak to a fertility expert. You can talk about sperm banking or harvesting your eggs. You can also ask about other fertility options, such as donor eggs. If you have or have had breast cancer, your doctor might advise you not to take the contraceptive pill. This is because the hormones in it might affect the cancer.  It is not known for sure whether or not chemotherapy drugs can be passed on through semen or secretions from the vagina. Because of this some doctors advise people to use a barrier method if you have sex during treatment. This applies to vaginal, anal or oral sex. Generally, doctors advise a barrier method only for the time you are actually  having the treatment and for about a week after your treatment. Advice like this can be worrying, but this does not mean that you have to avoid being intimate with your partner. You can still have close contact with your partner and continue to enjoy sex.  Animals If you have cats or birds we just ask that you not change the litter or change the cage.  Please have someone else do this for you while you are on chemotherapy.   Food Safety During and After Cancer Treatment Food safety is important for people both during and after cancer treatment. Cancer and cancer treatments, such as chemotherapy, radiation therapy, and stem cell/bone marrow transplantation, often weaken the immune system. This makes it harder for your body to protect itself from foodborne illness, also called food poisoning. Foodborne illness is caused by eating food that contains harmful bacteria, parasites, or viruses.  Foods to avoid Some foods have a higher risk of becoming tainted with bacteria. These include: Marland Kitchen Unwashed fresh fruit and vegetables, especially leafy vegetables that can hide dirt and other contaminants . Raw sprouts, such as alfalfa sprouts . Raw or undercooked beef, especially ground beef, or other raw or undercooked meat and poultry . Fatty, fried, or spicy foods immediately before or after treatment.  These can sit heavy on your stomach and make you feel nauseous. . Raw or undercooked shellfish, such as oysters. . Sushi and sashimi, which often contain raw fish.  . Unpasteurized beverages, such as unpasteurized fruit juices, raw milk, raw yogurt, or cider . Undercooked eggs, such as soft boiled, over easy, and poached; raw, unpasteurized eggs; or foods made with raw egg, such as homemade raw cookie dough and homemade mayonnaise Simple steps for food safety Shop smart. . Do not buy food stored or displayed in an unclean area. . Do not buy bruised or damaged fruits or vegetables. . Do not buy cans that have  cracks, dents, or bulges. . Pick up foods that can spoil at the end of your shopping trip and store them in a cooler on the way home. Prepare and clean up foods carefully. . Rinse all fresh fruits and vegetables under running water, and dry them with a clean towel or paper towel. . Clean the top of cans before opening them. . After preparing food, wash your hands for 20 seconds with hot water and soap. Pay special attention to areas between fingers and under nails. . Clean your  utensils and dishes with hot water and soap. Marland Kitchen Disinfect your kitchen and cutting boards using 1 teaspoon of liquid, unscented bleach mixed into 1 quart of water.   Dispose of old food. . Eat canned and packaged food before its expiration date (the "use by" or "best before" date). . Consume refrigerated leftovers within 3 to 4 days. After that time, throw out the food. Even if the food does not smell or look spoiled, it still may be unsafe. Some bacteria, such as Listeria, can grow even on foods stored in the refrigerator if they are kept for too long. Take precautions when eating out. . At restaurants, avoid buffets and salad bars where food sits out for a long time and comes in contact with many people. Food can become contaminated when someone with a virus, often a norovirus, or another "bug" handles it. . Put any leftover food in a "to-go" container yourself, rather than having the server do it. And, refrigerate leftovers as soon as you get home. . Choose restaurants that are clean and that are willing to prepare your food as you order it cooked.    MEDICATIONS: Dexamethasone 4 mg tablet:   Take 2 tablets (8 mg total) by mouth daily. Start the day after chemotherapy for 2 days.  Take with food.    Zofran/Ondansetron 8mg  tablet. Take 1 tablet every 8 hours as needed for nausea/vomiting. (#1 nausea med to take, this can constipate)  Compazine/Prochlorperazine 10mg  tablet. Take 1 tablet every 6 hours as needed for  nausea/vomiting. (#2 nausea med to take, this can make you sleepy)   EMLA cream. Apply a quarter size amount to port site 1 hour prior to chemo. Do not rub in. Cover with plastic wrap.   Over-the-Counter Meds:  Miralax 1 capful in 8 oz of fluid daily. May increase to two times a day if needed. This is a stool softener. If this doesn't work proceed you can add:  Senokot S-start with 1 tablet two times a day and increase to 4 tablets two times a day if needed. (total of 8 tablets in a 24 hour period). This is a stimulant laxative.   Call us if this does not help your bowels move.   Imodium 2mg  capsule. Take 2 capsules after the 1st loose stool and then 1 capsule every 2 hours until you go a total of 12 hours without having a loose stool. Call the [facility] if loose stools continue. If diarrhea occurs @ bedtime, take 2 capsules @ bedtime. Then take 2 capsules every 4 hours until morning. Call [facility].   Diarrhea Sheet  If you are having loose stools/diarrhea, please purchase Imodium and begin taking as outlined:  At the first sign of poorly formed or loose stools you should begin taking Imodium(loperamide) 2 mg capsules.  Take two caplets (4mg ) followed by one caplet (2mg ) every 2 hours until you have had no diarrhea for 12 hours.  During the night take two caplets (4mg ) at bedtime and continue every 4 hours during the night until the morning.  Stop taking Imodium only after there is no sign of diarrhea for 12 hours.    Always call the [facility] if you are having loose stools/diarrhea that you can't get under control.  Loose stools/disrrhea leads to dehydration (loss of water) in your body.  We have other options of trying to get the loose stools/diarrhea to stopped but you must let us know!    Constipation Sheet *Miralax in 8  oz of fluid daily.  May increase to two times a day if needed.  This is a stool softener.  If this not enough to keep your bowel regular:  You can  add:  *Senokot S, start with one tablet twice a day and can increase to 4 tablets twice a day if needed.  This is a stimulant laxative.   Sometimes when you take pain medication you need BOTH a medicine to keep your stool soft and a medicine to help your bowel push it out!  Please call if the above does not work for you.   Do not go more than 2 days without a bowel movement.  It is very important that you do not become constipated.  It will make you feel sick to your stomach (nausea) and can cause abdominal pain and vomiting.    Nausea Sheet  Zofran/Ondansetron 8mg  tablet. Take 1 tablet every 8 hours as needed for nausea/vomiting. (#1 nausea med to take, this can constipate)  Compazine/Prochlorperazine 10mg  tablet. Take 1 tablet every 6 hours as needed for nausea/vomiting. (#2 nausea med to take, this can make you sleepy)  You can take these medications together or separately.  We would first like for you to try the Ondansetron by itself and then take the Prochloperizine if needed. But you are allowed to take both medications at the same time if your nausea is that severe.  If you are having persistent nausea (nausea that does not stop) please take these medications on a staggered schedule so that the nausea medication stays in your body.  Please call the [facility] and let us know the amount of nausea that you are experiencing.  If you begin to vomit, you need to call the [facility] and if it is the weekend and you have vomited more than one time and cant get it to stop-go to the Emergency Room.  Persistent nausea/vomiting can lead to dehydration (loss of fluid in your body) and will make you feel terrible.   Ice chips, sips of clear liquids, foods that are @ room temperature, crackers, and toast tend to be better tolerated.     SYMPTOMS TO REPORT AS SOON AS POSSIBLE AFTER TREATMENT:  FEVER GREATER THAN 100.5 F  CHILLS WITH OR WITHOUT FEVER  NAUSEA AND VOMITING THAT IS NOT  CONTROLLED WITH YOUR NAUSEA MEDICATION  UNUSUAL SHORTNESS OF BREATH  UNUSUAL BRUISING OR BLEEDING  TENDERNESS IN MOUTH AND THROAT WITH OR WITHOUT PRESENCE OF ULCERS  URINARY PROBLEMS  BOWEL PROBLEMS  UNUSUAL RASH    Wear comfortable clothing and clothing appropriate for easy access to any Portacath or PICC line. Let us know if there is anything that we can do to make your therapy better!    What to do if you need assistance after hours or on the weekends: CALL [phone].  HOLD on the line, do not hang up.  You will hear multiple messages but at the end you will be connected with a nurse triage line.  They will contact the doctor if necessary.  Most of the time they will be able to assist you.  Do not call the hospital operator.    I have been informed and understand all of the instructions given to me and have received a copy. I have been instructed to call the clinic [phone] or my family physician as soon as possible for continued medical care, if indicated. I do not have any more questions at this time but understand that  I may call the [facility] or the Patient Navigator at [phone] during office hours should I have questions or need assistance in obtaining follow-up care.

## 2017-02-15 NOTE — Progress Notes (Signed)
Chemotherapy teaching completed.  Consent signed.  Extensive teaching packet given.   

## 2017-02-16 ENCOUNTER — Telehealth (HOSPITAL_COMMUNITY): Payer: Self-pay

## 2017-02-16 NOTE — Telephone Encounter (Signed)
See telephone encounter note.

## 2017-02-17 ENCOUNTER — Emergency Department (HOSPITAL_COMMUNITY)
Admission: EM | Admit: 2017-02-17 | Discharge: 2017-02-17 | Disposition: A | Discharge status: Home / Self Care | Payer: Medicare Other | Source: Home / Self Care | Attending: Emergency Medicine | Admitting: Emergency Medicine

## 2017-02-17 ENCOUNTER — Emergency Department (HOSPITAL_COMMUNITY): Payer: Medicare Other

## 2017-02-17 ENCOUNTER — Encounter (HOSPITAL_COMMUNITY): Payer: Self-pay | Admitting: Cardiology

## 2017-02-17 DIAGNOSIS — Z8673 Personal history of transient ischemic attack (TIA), and cerebral infarction without residual deficits: Secondary | ICD-10-CM | POA: Insufficient documentation

## 2017-02-17 DIAGNOSIS — N186 End stage renal disease: Secondary | ICD-10-CM | POA: Diagnosis not present

## 2017-02-17 DIAGNOSIS — E039 Hypothyroidism, unspecified: Secondary | ICD-10-CM

## 2017-02-17 DIAGNOSIS — Z794 Long term (current) use of insulin: Secondary | ICD-10-CM

## 2017-02-17 DIAGNOSIS — Z79899 Other long term (current) drug therapy: Secondary | ICD-10-CM

## 2017-02-17 DIAGNOSIS — C3490 Malignant neoplasm of unspecified part of unspecified bronchus or lung: Secondary | ICD-10-CM

## 2017-02-17 DIAGNOSIS — E1129 Type 2 diabetes mellitus with other diabetic kidney complication: Secondary | ICD-10-CM | POA: Diagnosis not present

## 2017-02-17 DIAGNOSIS — Z87891 Personal history of nicotine dependence: Secondary | ICD-10-CM | POA: Insufficient documentation

## 2017-02-17 DIAGNOSIS — J449 Chronic obstructive pulmonary disease, unspecified: Secondary | ICD-10-CM | POA: Insufficient documentation

## 2017-02-17 DIAGNOSIS — K591 Functional diarrhea: Secondary | ICD-10-CM

## 2017-02-17 DIAGNOSIS — I120 Hypertensive chronic kidney disease with stage 5 chronic kidney disease or end stage renal disease: Secondary | ICD-10-CM

## 2017-02-17 DIAGNOSIS — C787 Secondary malignant neoplasm of liver and intrahepatic bile duct: Secondary | ICD-10-CM | POA: Diagnosis not present

## 2017-02-17 DIAGNOSIS — D631 Anemia in chronic kidney disease: Secondary | ICD-10-CM | POA: Diagnosis not present

## 2017-02-17 DIAGNOSIS — Z992 Dependence on renal dialysis: Secondary | ICD-10-CM | POA: Insufficient documentation

## 2017-02-17 DIAGNOSIS — E1122 Type 2 diabetes mellitus with diabetic chronic kidney disease: Secondary | ICD-10-CM | POA: Insufficient documentation

## 2017-02-17 DIAGNOSIS — D509 Iron deficiency anemia, unspecified: Secondary | ICD-10-CM | POA: Diagnosis not present

## 2017-02-17 DIAGNOSIS — N2581 Secondary hyperparathyroidism of renal origin: Secondary | ICD-10-CM | POA: Diagnosis not present

## 2017-02-17 DIAGNOSIS — Z23 Encounter for immunization: Secondary | ICD-10-CM | POA: Diagnosis not present

## 2017-02-17 LAB — BASIC METABOLIC PANEL
Anion gap: 15 (ref 5–15)
BUN: 45 mg/dL — AB (ref 6–20)
CO2: 23 mmol/L (ref 22–32)
CREATININE: 6.67 mg/dL — AB (ref 0.44–1.00)
Calcium: 7.4 mg/dL — ABNORMAL LOW (ref 8.9–10.3)
Chloride: 97 mmol/L — ABNORMAL LOW (ref 101–111)
GFR calc Af Amer: 6 mL/min — ABNORMAL LOW (ref >60)
GFR, EST NON AFRICAN AMERICAN: 6 mL/min — AB (ref >60)
Glucose, Bld: 308 mg/dL — ABNORMAL HIGH (ref 65–99)
POTASSIUM: 5 mmol/L (ref 3.5–5.1)
SODIUM: 135 mmol/L (ref 135–145)

## 2017-02-17 LAB — CBC WITH DIFFERENTIAL/PLATELET
BASOS ABS: 0 10*3/uL (ref 0.0–0.1)
BASOS PCT: 0 %
Band Neutrophils: 0 %
EOS ABS: 0 10*3/uL (ref 0.0–0.7)
EOS PCT: 0 %
HCT: 25.9 % — ABNORMAL LOW (ref 36.0–46.0)
Hemoglobin: 7.9 g/dL — ABNORMAL LOW (ref 12.0–15.0)
LYMPHS ABS: 0.1 10*3/uL — AB (ref 0.7–4.0)
Lymphocytes Relative: 1 %
MCH: 25.3 pg — AB (ref 26.0–34.0)
MCHC: 30.5 g/dL (ref 30.0–36.0)
MCV: 83 fL (ref 78.0–100.0)
Monocytes Absolute: 0 10*3/uL — ABNORMAL LOW (ref 0.1–1.0)
Monocytes Relative: 0 %
NEUTROS PCT: 98 %
Neutro Abs: 28.3 10*3/uL — ABNORMAL HIGH (ref 1.7–7.7)
PLATELETS: 249 10*3/uL (ref 150–400)
RBC: 3.12 MIL/uL — ABNORMAL LOW (ref 3.87–5.11)
RDW: 17.3 % — ABNORMAL HIGH (ref 11.5–15.5)
WBC: 28.8 10*3/uL — AB (ref 4.0–10.5)

## 2017-02-17 MED ORDER — HYDROXYZINE HCL 25 MG PO TABS
25.0000 mg | ORAL_TABLET | Freq: Once | ORAL | Status: AC
  Administered 2017-02-17: 25 mg via ORAL
  Filled 2017-02-17: qty 1

## 2017-02-17 NOTE — ED Notes (Signed)
Patient's family members requested that o2 be increased from 2 lpm to 3 lpm due to "patient is having trouble breathing".  I went and patient's o2 sat 98%.  I advised Dr. Roderic Palau and he requested that we just increase to 3 lpm to satisfy family members.   RN notified and she increased o2 to 3 lpm

## 2017-02-17 NOTE — ED Triage Notes (Addendum)
Diarrhea times one week.  States unable to complete last 3 dialysis treatments due to diarrhea.  Vomiting off and on times one week. Denies any pain.

## 2017-02-17 NOTE — Discharge Instructions (Signed)
Follow-up with your family doctor next week if your diarrhea continues

## 2017-02-17 NOTE — ED Provider Notes (Signed)
[email or web address] DEPT Provider Note   CSN: [id number] Arrival date & time: 02/17/17  1708     History   Chief Complaint Chief Complaint  Patient presents with  . Diarrhea    HPI [name] is a 71 y.o. female.  Patient states for 3 weeks she's been having diarrhea on her dialysis days. She states she has about 3 episodes of diarrhea on the day no other complaints   The history is provided by the patient.  Diarrhea   This is a new problem. The current episode started more than 1 week ago. The problem occurs 2 to 4 times per day. The problem has not changed since onset.The stool consistency is described as watery. There has been no fever. Pertinent negatives include no abdominal pain, no chills, no headaches and no cough.    Past Medical History:  Diagnosis Date  . Arthritis    knee  . Bronchitis   . Cancer ([place]) 2018   Lung Cancer  . COPD (chronic obstructive pulmonary disease) ([place])   . Diabetes mellitus   . Diarrhea    not constant- frequent  . ESRD on hemodialysis ([place])    [facility]. Started HD November 26, 2009. 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  [facility] Kitchen Hyperlipidemia   . Hypertension   . Hypothyroidism   . Leg pain   . Peripheral vascular disease ([place])   . 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 Retroperitoneal bleeding 06/2016  . Stroke ([place])   . Thyroid disease     Patient Active Problem List   Diagnosis Date Noted  . Squamous carcinoma of lung, left ([place])   . Squamous cell carcinoma of lung, stage IV ([place]) 02/01/2017  . Lung mass 07/10/2016  . Community acquired pneumonia   . Renal cyst, right   . Renal hemorrhage, right   . Renal mass, right   . Acute blood loss anemia 07/07/2016  . Community acquired pneumonia of left lung   . Hypothyroidism   . Retroperitoneal bleeding 07/06/2016  . Retroperitoneal bleed 07/06/2016  . End stage renal disease ([place]) 11/21/2012  . Pre-operative cardiovascular  examination 11/21/2012  . Ulcer of lower limb, unspecified 07/11/2012  . Atherosclerosis of native arteries of the extremities with ulceration(440.23) 07/11/2012  . Aspiration pneumonia ([place]) 05/31/2012  . ESRD (end stage renal disease) ([place]) 05/30/2012  . Acute respiratory failure ([place]) 05/24/2012  . Severe sepsis ([place]) 05/24/2012  . Sepsis with metabolic encephalopathy ([place]) 05/24/2012  . Clostridium difficile colitis 05/24/2012  . Septic shock(785.52) 05/24/2012  . Fever 05/22/2012  . Femur fracture, left ([place]) 05/19/2012  . Fall at home 05/19/2012  . RENAL DISEASE, CHRONIC, STAGE V 07/29/2008  . ANEMIA OF RENAL FAILURE 04/14/2008  . UPPER RESPIRATORY INFECTION, VIRAL 04/02/2008  . ABDOMINAL PAIN 04/02/2008  . HYPERKERATOSIS 11/12/2007  . UNSPECIFIED DEBILITY 11/12/2007  . COUGH 10/03/2007  . RHINITIS 09/14/2007  . BACK PAIN, LUMBAR 09/14/2007  . CELLULITIS AND ABSCESS OF LEG EXCEPT FOOT 06/18/2007  . Diabetes mellitus type II, uncontrolled ([place]) 05/30/2007  . SYNCOPE 04/09/2007  . LEG CRAMPS 04/02/2007  . ELECTROCARDIOGRAM, ABNORMAL 04/02/2007  . RENAL INSUFFICIENCY, ACUTE 02/16/2007  . EDEMA LEG 10/20/2006  . BRONCHITIS, ACUTE WITH BRONCHOSPASM 09/22/2006  . ROTATOR CUFF INJURY, LEFT SHOULDER 08/08/2006  . FIBROCYSTIC BREAST DISEASE 07/28/2006  . SHOULDER PAIN, LEFT 07/28/2006  . GOITER, MULTINODULAR 05/03/2006  . HYPERLIPIDEMIA 05/03/2006  . Iron deficiency anemia 05/03/2006  .  SYNDROME, RESTLESS LEGS 05/03/2006  . Hypertension 05/03/2006  . Peripheral vascular disease ([place]) 05/03/2006  . CONSTIPATION 05/03/2006  . ARTHRITIS 05/03/2006  . PROTEINURIA 05/03/2006  . DIVERTICULITIS, HX OF 05/03/2006    Past Surgical History:  Procedure Laterality Date  . AMPUTATION Left 07/25/2012   Procedure: LEFT AMPUTATION BELOW KNEE;  Surgeon: Newt Minion, MD;  Location: [facility];  Service: Orthopedics;  Laterality: Left;  Left Below Knee Amputation  . AV FISTULA PLACEMENT   05/18/2010  . BASCILIC VEIN TRANSPOSITION Right 11/23/2012   Procedure: RIGHT BASCILIC VEIN TRANSPOSITION ;  Surgeon: Angelia Mould, MD;  Location: [facility];  Service: Vascular;  Laterality: Right;  . CHOLECYSTECTOMY    . DG AV DIALYSIS SHUNT ACCESS EXIST*R* OR     working right HD catheter  . FEMUR IM NAIL  05/20/2012   Procedure: INTRAMEDULLARY (IM) RETROGRADE FEMORAL NAILING;  Surgeon: Rozanna Box, MD;  Location: [facility];  Service: Orthopedics;  Laterality: Left;  . IR GENERIC HISTORICAL  07/07/2016   IR US GUIDE VASC ACCESS LEFT 07/07/2016 Markus Daft, MD [facility]  . IR GENERIC HISTORICAL  07/07/2016   IR FLUORO GUIDE CV LINE LEFT 07/07/2016 Markus Daft, MD [facility]  . PORTACATH PLACEMENT Left 02/13/2017   Procedure: INSERTION PORT-A-CATH LEFT SUBCLAVIAN;  Surgeon: Aviva Signs, MD;  Location: [facility];  Service: General;  Laterality: Left;    OB History    Gravida Para Term Preterm AB Living             2   SAB TAB Ectopic Multiple Live Births                   Home Medications    Prior to Admission medications   Medication Sig Start Date End Date Taking? Authorizing Provider  acetaminophen (TYLENOL) 500 MG tablet Take 2,000 mg by mouth 3 (three) times daily as needed for moderate pain or headache.   Yes [provider]  amLODipine (NORVASC) 5 MG tablet Take 5 mg by mouth daily.   Yes [provider]  calcium acetate (PHOSLO) 667 MG capsule Take 1 capsule (667 mg total) by mouth 2 (two) times daily with a meal. Patient taking differently: Take 1,334 mg by mouth 3 (three) times daily after meals.  06/04/12  Yes [name], Ripudeep K, MD  CARBOPLATIN IV Inject into the vein. Every 3 weeks   Yes [provider]  dexamethasone (DECADRON) 4 MG tablet Take 2 tablets (8 mg total) by mouth daily. Start the day after chemotherapy for 2 days. 02/01/17  Yes [name], MD  diclofenac sodium (VOLTAREN) 1 % GEL Apply 2 g topically daily as needed (for pain).  11/24/16   Yes [provider]  insulin glargine (LANTUS) 100 UNIT/ML injection Inject 0.1 mLs (10 Units total) into the skin at bedtime. Patient taking differently: Inject 20 Units into the skin at bedtime.  07/12/16  Yes Eugenie Filler, MD  insulin lispro (HUMALOG) 100 UNIT/ML injection Inject 8 Units into the skin daily after breakfast.   Yes [provider]  levothyroxine (SYNTHROID, LEVOTHROID) 75 MCG tablet Take 75 mcg by mouth daily.     Yes [provider]  lidocaine-prilocaine (EMLA) cream Apply to affected area once Patient taking differently: Apply 1 application topically as directed. Apply to affected area once 02/01/17  Yes [name], MD  linagliptin (TRADJENTA) 5 MG TABS tablet Take 5 mg by mouth daily.   Yes [provider]  metoCLOPramide (  REGLAN) 5 MG tablet Take 1-2 tablets (5-10 mg total) by mouth every 8 (eight) hours as needed (if ondansetron (ZOFRAN) ineffective.). 06/01/12  Yes [name], Ripudeep K, MD  Multiple Vitamins-Minerals (RENAL) TABS Take 1 tablet by mouth daily.   Yes [provider]  ondansetron (ZOFRAN) 8 MG tablet Take 1 tablet (8 mg total) by mouth every 8 (eight) hours as needed for nausea or vomiting. 02/07/17  Yes [name], MD  PACLitaxel (TAXOL IV) Inject into the vein. Every 3 weeks   Yes [provider]  Pegfilgrastim (NEULASTA ONPRO [place]) Inject into the skin. Every 3 weeks   Yes [provider]  prochlorperazine (COMPAZINE) 10 MG tablet Take 1 tablet (10 mg total) by mouth every 6 (six) hours as needed (Nausea or vomiting). 02/01/17  Yes [name], MD    Family History Family History  Problem Relation Age of Onset  . Diabetes Mother     Social History Social History  Substance Use Topics  . Smoking status: Former Smoker    Packs/day: 0.50    Years: 30.00    Types: Cigarettes    Quit date: 04/29/2012  . Smokeless tobacco: Never Used  . Alcohol use No     Allergies   Contrast media  [iodinated diagnostic agents] and Latex   Review of Systems Review of Systems  Constitutional: Negative for appetite change, chills and fatigue.  HENT: Negative for congestion, ear discharge and sinus pressure.   Eyes: Negative for discharge.  Respiratory: Negative for cough.   Cardiovascular: Negative for chest pain.  Gastrointestinal: Positive for diarrhea. Negative for abdominal pain.  Genitourinary: Negative for frequency and hematuria.  Musculoskeletal: Negative for back pain.  Skin: Negative for rash.  Neurological: Negative for seizures and headaches.  Psychiatric/Behavioral: Negative for hallucinations.     Physical Exam Updated Vital Signs BP (!) 120/42 (BP Location: Right Arm)   Pulse 92   Temp 98.5 F (36.9 C) (Oral)   Resp 20   Ht 5\' 3"  (1.6 m)   Wt 77.6 kg (171 lb)   SpO2 91%   BMI 30.29 kg/m   Physical Exam  Constitutional: [name] oriented to person, place, and time. She appears well-developed.  HENT:  Head: Normocephalic.  Eyes: Conjunctivae and EOM are normal. No scleral icterus.  Neck: Neck supple. No thyromegaly present.  Cardiovascular: Normal rate and regular rhythm.  Exam reveals no gallop and no friction rub.   No murmur heard. Pulmonary/Chest: No stridor. She has no wheezes. She has no rales. She exhibits no tenderness.  Abdominal: She exhibits no distension. There is no tenderness. There is no rebound.  Musculoskeletal: Normal range of motion. She exhibits no edema.  BKA on left  Lymphadenopathy:    She has no cervical adenopathy.  Neurological: [name] oriented to person, place, and time. She exhibits normal muscle tone. Coordination normal.  Skin: No rash noted. No erythema.  Psychiatric: She has a normal mood and affect. Her behavior is normal.     ED Treatments / Results  Labs (all labs ordered are listed, but only abnormal results are displayed) Labs Reviewed  CBC WITH DIFFERENTIAL/PLATELET - Abnormal; Notable for the following:        Result Value   WBC 28.8 (*)    RBC 3.12 (*)    Hemoglobin 7.9 (*)    HCT 25.9 (*)    MCH 25.3 (*)    RDW 17.3 (*)    All other components within normal limits  BASIC METABOLIC  PANEL - Abnormal; Notable for the following:    Chloride 97 (*)    Glucose, Bld 308 (*)    BUN 45 (*)    Creatinine, Ser 6.67 (*)    Calcium 7.4 (*)    GFR calc non Af Amer 6 (*)    GFR calc Af Amer 6 (*)    All other components within normal limits  C DIFFICILE QUICK SCREEN W PCR REFLEX    EKG  EKG Interpretation None       Radiology Ct Abdomen Pelvis Wo Contrast  Result Date: 02/17/2017 CLINICAL DATA:  Abdominal distention. History of metastatic lung cancer. EXAM: CT ABDOMEN AND PELVIS WITHOUT CONTRAST TECHNIQUE: Multidetector CT imaging of the abdomen and pelvis was performed following the standard protocol without IV contrast. COMPARISON:  07/06/2016 and 06/27/2016 as well as PET-CT 12/30/2016 FINDINGS: Lower chest: Lung bases unchanged. Calcified plaque over the coronary arteries unchanged. Calcified plaque over the descending thoracic aorta. Hepatobiliary: Interval worsening of numerous hypodense liver metastases. Previous cholecystectomy. Biliary tree within normal. Pancreas: Within normal. Spleen: Within normal. Adrenals/Urinary Tract: Adrenal glands normal. Kidneys are normal in size with multiple hypo and hyperdense renal cortical lesions such unchanged likely simple and hemorrhagic cysts. No hydronephrosis. Several vascular calcifications over the corticomedullary junction bilaterally. Ureters and bladder are normal. Stomach/Bowel: Stomach and small bowel are normal. The appendix is normal. There is diverticulosis of the colon. Vascular/Lymphatic: Moderate calcified plaque over the abdominal aorta and iliac arteries. No definite adenopathy. Reproductive: Unremarkable. Other: No significant free fluid or focal inflammatory change. Musculoskeletal: Mild degenerative change of the spine and hips.  Intramedullary rod over the left femur. IMPRESSION: No acute findings in the abdomen/pelvis. Interval worsening of numerous liver metastases from patient's known lung cancer. Multiple stable hyper and hypodense renal cortical lesions likely simple and hemorrhagic cysts. Colonic diverticulosis. Atherosclerotic coronary artery disease. Aortic Atherosclerosis (ICD10-I70.0). Electronically Signed   By: Marin Olp M.D.   On: 02/17/2017 18:35    Procedures Procedures (including critical care time)  Medications Ordered in ED Medications - No data to display   Initial Impression / Assessment and Plan / ED Course  I have reviewed the triage vital signs and the nursing notes.  Pertinent labs & imaging results that were available during my care of the patient were reviewed by me and considered in my medical decision making (see chart for details).     Patient had leukocytosis but she is on Decadron. Also her CT of the abdomen was unremarkable. She will be discharged home to follow-up with her primary care doctor  Final Clinical Impressions(s) / ED Diagnoses   Final diagnoses:  Functional diarrhea    New Prescriptions New Prescriptions   No medications on file     Milton Ferguson, MD 02/17/17 2057

## 2017-02-17 NOTE — ED Notes (Signed)
Pt taken to ct 

## 2017-02-18 DIAGNOSIS — N186 End stage renal disease: Secondary | ICD-10-CM | POA: Diagnosis not present

## 2017-02-18 DIAGNOSIS — Z23 Encounter for immunization: Secondary | ICD-10-CM | POA: Diagnosis not present

## 2017-02-18 DIAGNOSIS — D509 Iron deficiency anemia, unspecified: Secondary | ICD-10-CM | POA: Diagnosis not present

## 2017-02-18 DIAGNOSIS — E1129 Type 2 diabetes mellitus with other diabetic kidney complication: Secondary | ICD-10-CM | POA: Diagnosis not present

## 2017-02-18 DIAGNOSIS — D631 Anemia in chronic kidney disease: Secondary | ICD-10-CM | POA: Diagnosis not present

## 2017-02-18 DIAGNOSIS — N2581 Secondary hyperparathyroidism of renal origin: Secondary | ICD-10-CM | POA: Diagnosis not present

## 2017-02-19 ENCOUNTER — Emergency Department (HOSPITAL_COMMUNITY): Payer: Medicare Other

## 2017-02-19 ENCOUNTER — Inpatient Hospital Stay (HOSPITAL_COMMUNITY)
Admission: EM | Admit: 2017-02-19 | Discharge: 2017-02-27 | DRG: 871 | Disposition: E | Payer: Medicare Other | Attending: Internal Medicine | Admitting: Internal Medicine

## 2017-02-19 ENCOUNTER — Encounter (HOSPITAL_COMMUNITY): Payer: Self-pay | Admitting: Emergency Medicine

## 2017-02-19 DIAGNOSIS — J810 Acute pulmonary edema: Secondary | ICD-10-CM | POA: Diagnosis not present

## 2017-02-19 DIAGNOSIS — E43 Unspecified severe protein-calorie malnutrition: Secondary | ICD-10-CM | POA: Diagnosis present

## 2017-02-19 DIAGNOSIS — Z515 Encounter for palliative care: Secondary | ICD-10-CM | POA: Diagnosis not present

## 2017-02-19 DIAGNOSIS — R061 Stridor: Secondary | ICD-10-CM | POA: Diagnosis not present

## 2017-02-19 DIAGNOSIS — I120 Hypertensive chronic kidney disease with stage 5 chronic kidney disease or end stage renal disease: Secondary | ICD-10-CM | POA: Diagnosis not present

## 2017-02-19 DIAGNOSIS — C7900 Secondary malignant neoplasm of unspecified kidney and renal pelvis: Secondary | ICD-10-CM | POA: Diagnosis present

## 2017-02-19 DIAGNOSIS — E11649 Type 2 diabetes mellitus with hypoglycemia without coma: Secondary | ICD-10-CM | POA: Diagnosis not present

## 2017-02-19 DIAGNOSIS — Z66 Do not resuscitate: Secondary | ICD-10-CM | POA: Diagnosis present

## 2017-02-19 DIAGNOSIS — I1311 Hypertensive heart and chronic kidney disease without heart failure, with stage 5 chronic kidney disease, or end stage renal disease: Secondary | ICD-10-CM | POA: Diagnosis present

## 2017-02-19 DIAGNOSIS — Z91041 Radiographic dye allergy status: Secondary | ICD-10-CM

## 2017-02-19 DIAGNOSIS — A419 Sepsis, unspecified organism: Principal | ICD-10-CM | POA: Diagnosis present

## 2017-02-19 DIAGNOSIS — E1121 Type 2 diabetes mellitus with diabetic nephropathy: Secondary | ICD-10-CM | POA: Diagnosis present

## 2017-02-19 DIAGNOSIS — J9811 Atelectasis: Secondary | ICD-10-CM | POA: Diagnosis present

## 2017-02-19 DIAGNOSIS — Z9115 Patient's noncompliance with renal dialysis: Secondary | ICD-10-CM

## 2017-02-19 DIAGNOSIS — Y95 Nosocomial condition: Secondary | ICD-10-CM | POA: Diagnosis present

## 2017-02-19 DIAGNOSIS — J440 Chronic obstructive pulmonary disease with acute lower respiratory infection: Secondary | ICD-10-CM | POA: Diagnosis present

## 2017-02-19 DIAGNOSIS — E039 Hypothyroidism, unspecified: Secondary | ICD-10-CM | POA: Diagnosis present

## 2017-02-19 DIAGNOSIS — Z531 Procedure and treatment not carried out because of patient's decision for reasons of belief and group pressure: Secondary | ICD-10-CM | POA: Diagnosis present

## 2017-02-19 DIAGNOSIS — J189 Pneumonia, unspecified organism: Secondary | ICD-10-CM | POA: Diagnosis present

## 2017-02-19 DIAGNOSIS — E872 Acidosis: Secondary | ICD-10-CM | POA: Diagnosis present

## 2017-02-19 DIAGNOSIS — Z6828 Body mass index (BMI) 28.0-28.9, adult: Secondary | ICD-10-CM

## 2017-02-19 DIAGNOSIS — C3492 Malignant neoplasm of unspecified part of left bronchus or lung: Secondary | ICD-10-CM | POA: Diagnosis not present

## 2017-02-19 DIAGNOSIS — E1151 Type 2 diabetes mellitus with diabetic peripheral angiopathy without gangrene: Secondary | ICD-10-CM | POA: Diagnosis present

## 2017-02-19 DIAGNOSIS — R402421 Glasgow coma scale score 9-12, in the field [EMT or ambulance]: Secondary | ICD-10-CM | POA: Diagnosis not present

## 2017-02-19 DIAGNOSIS — E1129 Type 2 diabetes mellitus with other diabetic kidney complication: Secondary | ICD-10-CM | POA: Diagnosis present

## 2017-02-19 DIAGNOSIS — E032 Hypothyroidism due to medicaments and other exogenous substances: Secondary | ICD-10-CM

## 2017-02-19 DIAGNOSIS — R6520 Severe sepsis without septic shock: Secondary | ICD-10-CM

## 2017-02-19 DIAGNOSIS — Z87891 Personal history of nicotine dependence: Secondary | ICD-10-CM

## 2017-02-19 DIAGNOSIS — Z89512 Acquired absence of left leg below knee: Secondary | ICD-10-CM

## 2017-02-19 DIAGNOSIS — G9341 Metabolic encephalopathy: Secondary | ICD-10-CM | POA: Diagnosis present

## 2017-02-19 DIAGNOSIS — D631 Anemia in chronic kidney disease: Secondary | ICD-10-CM | POA: Diagnosis present

## 2017-02-19 DIAGNOSIS — E785 Hyperlipidemia, unspecified: Secondary | ICD-10-CM | POA: Diagnosis present

## 2017-02-19 DIAGNOSIS — N2581 Secondary hyperparathyroidism of renal origin: Secondary | ICD-10-CM | POA: Diagnosis present

## 2017-02-19 DIAGNOSIS — Z794 Long term (current) use of insulin: Secondary | ICD-10-CM | POA: Diagnosis not present

## 2017-02-19 DIAGNOSIS — C3490 Malignant neoplasm of unspecified part of unspecified bronchus or lung: Secondary | ICD-10-CM | POA: Diagnosis present

## 2017-02-19 DIAGNOSIS — T451X5A Adverse effect of antineoplastic and immunosuppressive drugs, initial encounter: Secondary | ICD-10-CM | POA: Diagnosis not present

## 2017-02-19 DIAGNOSIS — Z833 Family history of diabetes mellitus: Secondary | ICD-10-CM

## 2017-02-19 DIAGNOSIS — Z8673 Personal history of transient ischemic attack (TIA), and cerebral infarction without residual deficits: Secondary | ICD-10-CM

## 2017-02-19 DIAGNOSIS — J9600 Acute respiratory failure, unspecified whether with hypoxia or hypercapnia: Secondary | ICD-10-CM

## 2017-02-19 DIAGNOSIS — E033 Postinfectious hypothyroidism: Secondary | ICD-10-CM | POA: Diagnosis not present

## 2017-02-19 DIAGNOSIS — Z91040 Latex allergy status: Secondary | ICD-10-CM

## 2017-02-19 DIAGNOSIS — G9340 Encephalopathy, unspecified: Secondary | ICD-10-CM | POA: Diagnosis not present

## 2017-02-19 DIAGNOSIS — E0821 Diabetes mellitus due to underlying condition with diabetic nephropathy: Secondary | ICD-10-CM | POA: Diagnosis not present

## 2017-02-19 DIAGNOSIS — Z7189 Other specified counseling: Secondary | ICD-10-CM | POA: Diagnosis not present

## 2017-02-19 DIAGNOSIS — J9621 Acute and chronic respiratory failure with hypoxia: Secondary | ICD-10-CM | POA: Diagnosis not present

## 2017-02-19 DIAGNOSIS — C787 Secondary malignant neoplasm of liver and intrahepatic bile duct: Secondary | ICD-10-CM | POA: Diagnosis present

## 2017-02-19 DIAGNOSIS — E1122 Type 2 diabetes mellitus with diabetic chronic kidney disease: Secondary | ICD-10-CM | POA: Diagnosis present

## 2017-02-19 DIAGNOSIS — N186 End stage renal disease: Secondary | ICD-10-CM | POA: Diagnosis not present

## 2017-02-19 DIAGNOSIS — R0602 Shortness of breath: Secondary | ICD-10-CM | POA: Diagnosis not present

## 2017-02-19 DIAGNOSIS — J9601 Acute respiratory failure with hypoxia: Secondary | ICD-10-CM | POA: Diagnosis present

## 2017-02-19 DIAGNOSIS — Z9049 Acquired absence of other specified parts of digestive tract: Secondary | ICD-10-CM

## 2017-02-19 DIAGNOSIS — Z9221 Personal history of antineoplastic chemotherapy: Secondary | ICD-10-CM

## 2017-02-19 DIAGNOSIS — Z992 Dependence on renal dialysis: Secondary | ICD-10-CM

## 2017-02-19 DIAGNOSIS — Z79899 Other long term (current) drug therapy: Secondary | ICD-10-CM

## 2017-02-19 DIAGNOSIS — D61810 Antineoplastic chemotherapy induced pancytopenia: Secondary | ICD-10-CM | POA: Diagnosis not present

## 2017-02-19 LAB — COMPREHENSIVE METABOLIC PANEL
ALK PHOS: 181 U/L — AB (ref 38–126)
ALT: 12 U/L — ABNORMAL LOW (ref 14–54)
ANION GAP: 13 (ref 5–15)
AST: 139 U/L — ABNORMAL HIGH (ref 15–41)
Albumin: 2.3 g/dL — ABNORMAL LOW (ref 3.5–5.0)
BILIRUBIN TOTAL: 0.8 mg/dL (ref 0.3–1.2)
BUN: 28 mg/dL — ABNORMAL HIGH (ref 6–20)
CALCIUM: 7.6 mg/dL — AB (ref 8.9–10.3)
CO2: 26 mmol/L (ref 22–32)
CREATININE: 4.15 mg/dL — AB (ref 0.44–1.00)
Chloride: 100 mmol/L — ABNORMAL LOW (ref 101–111)
GFR calc non Af Amer: 10 mL/min — ABNORMAL LOW (ref >60)
GFR, EST AFRICAN AMERICAN: 11 mL/min — AB (ref >60)
GLUCOSE: 209 mg/dL — AB (ref 65–99)
Potassium: 4.5 mmol/L (ref 3.5–5.1)
Sodium: 139 mmol/L (ref 135–145)
TOTAL PROTEIN: 7 g/dL (ref 6.5–8.1)

## 2017-02-19 LAB — CBC WITH DIFFERENTIAL/PLATELET
Basophils Absolute: 0 10*3/uL (ref 0.0–0.1)
Basophils Relative: 0 %
EOS ABS: 0 10*3/uL (ref 0.0–0.7)
Eosinophils Relative: 0 %
HCT: 26.6 % — ABNORMAL LOW (ref 36.0–46.0)
Hemoglobin: 8.1 g/dL — ABNORMAL LOW (ref 12.0–15.0)
LYMPHS ABS: 0.7 10*3/uL (ref 0.7–4.0)
Lymphocytes Relative: 4 %
MCH: 25.3 pg — AB (ref 26.0–34.0)
MCHC: 30.5 g/dL (ref 30.0–36.0)
MCV: 83.1 fL (ref 78.0–100.0)
MONO ABS: 0.5 10*3/uL (ref 0.1–1.0)
MONOS PCT: 3 %
NEUTROS PCT: 93 %
Neutro Abs: 16.6 10*3/uL — ABNORMAL HIGH (ref 1.7–7.7)
Platelets: 208 10*3/uL (ref 150–400)
RBC: 3.2 MIL/uL — ABNORMAL LOW (ref 3.87–5.11)
RDW: 17.4 % — ABNORMAL HIGH (ref 11.5–15.5)
WBC: 17.9 10*3/uL — ABNORMAL HIGH (ref 4.0–10.5)

## 2017-02-19 LAB — HEMOGLOBIN A1C
HEMOGLOBIN A1C: 5.8 % — AB (ref 4.8–5.6)
MEAN PLASMA GLUCOSE: 119.76 mg/dL

## 2017-02-19 LAB — GLUCOSE, CAPILLARY
GLUCOSE-CAPILLARY: 120 mg/dL — AB (ref 65–99)
GLUCOSE-CAPILLARY: 103 mg/dL — AB (ref 65–99)
Glucose-Capillary: 160 mg/dL — ABNORMAL HIGH (ref 65–99)
Glucose-Capillary: 102 mg/dL — ABNORMAL HIGH (ref 65–99)

## 2017-02-19 LAB — I-STAT CG4 LACTIC ACID, ED
LACTIC ACID, VENOUS: 1.15 mmol/L (ref 0.5–1.9)
Lactic Acid, Venous: 1.52 mmol/L (ref 0.5–1.9)

## 2017-02-19 MED ORDER — IBUPROFEN 400 MG PO TABS
400.0000 mg | ORAL_TABLET | Freq: Three times a day (TID) | ORAL | Status: DC | PRN
  Administered 2017-02-19 – 2017-02-20 (×2): 400 mg via ORAL
  Filled 2017-02-19 (×2): qty 1

## 2017-02-19 MED ORDER — PIPERACILLIN-TAZOBACTAM 3.375 G IVPB 30 MIN
3.3750 g | Freq: Once | INTRAVENOUS | Status: AC
Start: 2017-02-19 — End: 2017-02-19
  Administered 2017-02-19: 3.375 g via INTRAVENOUS
  Filled 2017-02-19: qty 50

## 2017-02-19 MED ORDER — ACETAMINOPHEN 650 MG RE SUPP
650.0000 mg | RECTAL | Status: DC | PRN
  Administered 2017-02-19 – 2017-02-20 (×3): 650 mg via RECTAL
  Filled 2017-02-19 (×3): qty 1

## 2017-02-19 MED ORDER — HEPARIN SODIUM (PORCINE) 5000 UNIT/ML IJ SOLN
5000.0000 [IU] | Freq: Three times a day (TID) | INTRAMUSCULAR | Status: DC
  Administered 2017-02-19 – 2017-02-24 (×14): 5000 [IU] via SUBCUTANEOUS
  Filled 2017-02-19 (×14): qty 1

## 2017-02-19 MED ORDER — SODIUM CHLORIDE 0.9% FLUSH: 3.0000 mL | INTRAVENOUS | Status: DC | PRN

## 2017-02-19 MED ORDER — ACETAMINOPHEN 650 MG RE SUPP
RECTAL | Status: AC
  Filled 2017-02-19: qty 1

## 2017-02-19 MED ORDER — SODIUM CHLORIDE 0.9 % IV BOLUS (SEPSIS): 500.0000 mL | Freq: Once | INTRAVENOUS | Status: DC

## 2017-02-19 MED ORDER — PIPERACILLIN-TAZOBACTAM 3.375 G IVPB
3.3750 g | Freq: Two times a day (BID) | INTRAVENOUS | Status: DC
  Administered 2017-02-19 – 2017-02-23 (×10): 3.375 g via INTRAVENOUS
  Filled 2017-02-19 (×10): qty 50

## 2017-02-19 MED ORDER — VANCOMYCIN HCL IN DEXTROSE 1-5 GM/200ML-% IV SOLN
1000.0000 mg | Freq: Once | INTRAVENOUS | Status: AC
  Administered 2017-02-19: 1000 mg via INTRAVENOUS
  Filled 2017-02-19: qty 200

## 2017-02-19 MED ORDER — SODIUM CHLORIDE 0.9 % IV BOLUS (SEPSIS)
1000.0000 mL | Freq: Once | INTRAVENOUS | Status: AC
  Administered 2017-02-19: 1000 mL via INTRAVENOUS

## 2017-02-19 MED ORDER — SODIUM CHLORIDE 0.9% FLUSH
3.0000 mL | Freq: Two times a day (BID) | INTRAVENOUS | Status: DC
  Administered 2017-02-19 – 2017-02-25 (×12): 3 mL via INTRAVENOUS

## 2017-02-19 MED ORDER — INSULIN ASPART 100 UNIT/ML ~~LOC~~ SOLN
0.0000 [IU] | Freq: Three times a day (TID) | SUBCUTANEOUS | Status: DC
  Administered 2017-02-19 – 2017-02-23 (×3): 2 [IU] via SUBCUTANEOUS

## 2017-02-19 MED ORDER — ACETAMINOPHEN 650 MG RE SUPP
650.0000 mg | Freq: Once | RECTAL | Status: AC
  Administered 2017-02-19: 650 mg via RECTAL

## 2017-02-19 MED ORDER — LEVOTHYROXINE SODIUM 75 MCG PO TABS
75.0000 ug | ORAL_TABLET | Freq: Every day | ORAL | Status: DC
  Administered 2017-02-19 – 2017-02-23 (×3): 75 ug via ORAL
  Filled 2017-02-19 (×4): qty 1

## 2017-02-19 MED ORDER — INSULIN GLARGINE 100 UNIT/ML ~~LOC~~ SOLN
20.0000 [IU] | Freq: Every day | SUBCUTANEOUS | Status: DC
  Administered 2017-02-19 – 2017-02-20 (×2): 20 [IU] via SUBCUTANEOUS
  Filled 2017-02-19 (×3): qty 0.2

## 2017-02-19 MED ORDER — ONDANSETRON HCL 4 MG PO TABS
4.0000 mg | ORAL_TABLET | Freq: Four times a day (QID) | ORAL | Status: DC | PRN
  Administered 2017-02-23: 4 mg via ORAL
  Filled 2017-02-19: qty 1

## 2017-02-19 MED ORDER — VANCOMYCIN HCL IN DEXTROSE 750-5 MG/150ML-% IV SOLN
750.0000 mg | INTRAVENOUS | Status: DC
  Administered 2017-02-20: 750 mg via INTRAVENOUS
  Filled 2017-02-19 (×2): qty 150

## 2017-02-19 MED ORDER — SODIUM CHLORIDE 0.9 % IV SOLN: 250.0000 mL | INTRAVENOUS | Status: DC | PRN

## 2017-02-19 MED ORDER — ONDANSETRON HCL 4 MG/2ML IJ SOLN
4.0000 mg | Freq: Four times a day (QID) | INTRAMUSCULAR | Status: DC | PRN
  Administered 2017-02-23: 4 mg via INTRAVENOUS
  Filled 2017-02-19: qty 2

## 2017-02-19 NOTE — ED Provider Notes (Signed)
[facility] DEPT Provider Note   CSN: [id number] Arrival date & time: 02/05/2017  0015     History   Chief Complaint Chief Complaint  Patient presents with  . Code Sepsis   LEVEL 5 CAVEAT DUE TO ACUITY OF CONDITION  HPI [name] is a 71 y.o. female.  The history is provided by the EMS personnel and a relative.  Fever   This is a new problem. Episode onset: unknown. The problem occurs constantly. The problem has been gradually worsening. Associated symptoms comments: Confusion .  Patient with h/o ESRD, lung CA presents with fever and altered mental status She was recently seen for diarrhea, this improved but over past day has been confused Last dialysis earlier today No other details are known at arrival due to patient confusion   Past Medical History:  Diagnosis Date  . Arthritis    knee  . Bronchitis   . Cancer ([place]) 2018   Lung Cancer  . COPD (chronic obstructive pulmonary disease) ([place])   . Diabetes mellitus   . Diarrhea    not constant- frequent  . ESRD on hemodialysis ([place])    [facility]. Started HD November 26, 2009. 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  Marland Kitchen Hyperlipidemia   . Hypertension   . Hypothyroidism   . Leg pain   . Peripheral vascular disease ([place])   . 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 Retroperitoneal bleeding 06/2016  . Stroke ([place])   . Thyroid disease     Patient Active Problem List   Diagnosis Date Noted  . Squamous carcinoma of lung, left ([place])   . Squamous cell carcinoma of lung, stage IV ([place]) 02/01/2017  . Lung mass 07/10/2016  . Community acquired pneumonia   . Renal cyst, right   . Renal hemorrhage, right   . Renal mass, right   . Acute blood loss anemia 07/07/2016  . Community acquired pneumonia of left lung   . Hypothyroidism   . Retroperitoneal bleeding 07/06/2016  . Retroperitoneal bleed 07/06/2016  . End stage renal disease ([place]) 11/21/2012  . Pre-operative  cardiovascular examination 11/21/2012  . Ulcer of lower limb, unspecified 07/11/2012  . Atherosclerosis of native arteries of the extremities with ulceration(440.23) 07/11/2012  . Aspiration pneumonia ([place]) 05/31/2012  . ESRD (end stage renal disease) ([place]) 05/30/2012  . Acute respiratory failure ([place]) 05/24/2012  . Severe sepsis ([place]) 05/24/2012  . Sepsis with metabolic encephalopathy ([place]) 05/24/2012  . Clostridium difficile colitis 05/24/2012  . Septic shock(785.52) 05/24/2012  . Fever 05/22/2012  . Femur fracture, left ([place]) 05/19/2012  . Fall at home 05/19/2012  . RENAL DISEASE, CHRONIC, STAGE V 07/29/2008  . ANEMIA OF RENAL FAILURE 04/14/2008  . UPPER RESPIRATORY INFECTION, VIRAL 04/02/2008  . ABDOMINAL PAIN 04/02/2008  . HYPERKERATOSIS 11/12/2007  . UNSPECIFIED DEBILITY 11/12/2007  . COUGH 10/03/2007  . RHINITIS 09/14/2007  . BACK PAIN, LUMBAR 09/14/2007  . CELLULITIS AND ABSCESS OF LEG EXCEPT FOOT 06/18/2007  . Diabetes mellitus type II, uncontrolled ([place]) 05/30/2007  . SYNCOPE 04/09/2007  . LEG CRAMPS 04/02/2007  . ELECTROCARDIOGRAM, ABNORMAL 04/02/2007  . RENAL INSUFFICIENCY, ACUTE 02/16/2007  . EDEMA LEG 10/20/2006  . BRONCHITIS, ACUTE WITH BRONCHOSPASM 09/22/2006  . ROTATOR CUFF INJURY, LEFT SHOULDER 08/08/2006  . FIBROCYSTIC BREAST DISEASE 07/28/2006  . SHOULDER PAIN, LEFT 07/28/2006  . GOITER, MULTINODULAR 05/03/2006  . HYPERLIPIDEMIA 05/03/2006  . Iron deficiency anemia 05/03/2006  . SYNDROME, RESTLESS LEGS 05/03/2006  .  Hypertension 05/03/2006  . Peripheral vascular disease ([place]) 05/03/2006  . CONSTIPATION 05/03/2006  . ARTHRITIS 05/03/2006  . PROTEINURIA 05/03/2006  . DIVERTICULITIS, HX OF 05/03/2006    Past Surgical History:  Procedure Laterality Date  . AMPUTATION Left 07/25/2012   Procedure: LEFT AMPUTATION BELOW KNEE;  Surgeon: Newt Minion, MD;  Location: [facility];  Service: Orthopedics;  Laterality: Left;  Left Below Knee Amputation  . AV FISTULA  PLACEMENT  05/18/2010  . BASCILIC VEIN TRANSPOSITION Right 11/23/2012   Procedure: RIGHT BASCILIC VEIN TRANSPOSITION ;  Surgeon: Angelia Mould, MD;  Location: [facility];  Service: Vascular;  Laterality: Right;  . CHOLECYSTECTOMY    . DG AV DIALYSIS SHUNT ACCESS EXIST*R* OR     working right HD catheter  . FEMUR IM NAIL  05/20/2012   Procedure: INTRAMEDULLARY (IM) RETROGRADE FEMORAL NAILING;  Surgeon: Rozanna Box, MD;  Location: [facility];  Service: Orthopedics;  Laterality: Left;  . IR GENERIC HISTORICAL  07/07/2016   IR US GUIDE VASC ACCESS LEFT 07/07/2016 Markus Daft, MD [facility]  . IR GENERIC HISTORICAL  07/07/2016   IR FLUORO GUIDE CV LINE LEFT 07/07/2016 Markus Daft, MD [facility]  . PORTACATH PLACEMENT Left 02/13/2017   Procedure: INSERTION PORT-A-CATH LEFT SUBCLAVIAN;  Surgeon: Aviva Signs, MD;  Location: [facility];  Service: General;  Laterality: Left;    OB History    Gravida Para Term Preterm AB Living             2   SAB TAB Ectopic Multiple Live Births                   Home Medications    Prior to Admission medications   Medication Sig Start Date End Date Taking? Authorizing Provider  acetaminophen (TYLENOL) 500 MG tablet Take 2,000 mg by mouth 3 (three) times daily as needed for moderate pain or headache.    [provider]  amLODipine (NORVASC) 5 MG tablet Take 5 mg by mouth daily.    [provider]  calcium acetate (PHOSLO) 667 MG capsule Take 1 capsule (667 mg total) by mouth 2 (two) times daily with a meal. Patient taking differently: Take 1,334 mg by mouth 3 (three) times daily after meals.  06/04/12   [name], Sheila Emerald, MD  CARBOPLATIN IV Inject into the vein. Every 3 weeks    [provider]  dexamethasone (DECADRON) 4 MG tablet Take 2 tablets (8 mg total) by mouth daily. Start the day after chemotherapy for 2 days. 02/01/17   [name], MD  diclofenac sodium (VOLTAREN) 1 % GEL Apply 2 g topically daily as needed (for pain).  11/24/16    [provider]  insulin glargine (LANTUS) 100 UNIT/ML injection Inject 0.1 mLs (10 Units total) into the skin at bedtime. Patient taking differently: Inject 20 Units into the skin at bedtime.  07/12/16   Eugenie Filler, MD  insulin lispro (HUMALOG) 100 UNIT/ML injection Inject 8 Units into the skin daily after breakfast.    [provider]  levothyroxine (SYNTHROID, LEVOTHROID) 75 MCG tablet Take 75 mcg by mouth daily.      [provider]  lidocaine-prilocaine (EMLA) cream Apply to affected area once Patient taking differently: Apply 1 application topically as directed. Apply to affected area once 02/01/17   [name], MD  linagliptin (TRADJENTA) 5 MG TABS tablet Take 5 mg by mouth daily.    [provider]  metoCLOPramide (REGLAN) 5 MG tablet Take 1-2  tablets (5-10 mg total) by mouth every 8 (eight) hours as needed (if ondansetron (ZOFRAN) ineffective.). 06/01/12   [name], Sheila Emerald, MD  Multiple Vitamins-Minerals (RENAL) TABS Take 1 tablet by mouth daily.    [provider]  ondansetron (ZOFRAN) 8 MG tablet Take 1 tablet (8 mg total) by mouth every 8 (eight) hours as needed for nausea or vomiting. 02/07/17   [name], MD  PACLitaxel (TAXOL IV) Inject into the vein. Every 3 weeks    [provider]  Pegfilgrastim (NEULASTA ONPRO [place]) Inject into the skin. Every 3 weeks    [provider]  prochlorperazine (COMPAZINE) 10 MG tablet Take 1 tablet (10 mg total) by mouth every 6 (six) hours as needed (Nausea or vomiting). 02/01/17   [name], MD    Family History Family History  Problem Relation Age of Onset  . Diabetes Mother     Social History Social History  Substance Use Topics  . Smoking status: Former Smoker    Packs/day: 0.50    Years: 30.00    Types: Cigarettes    Quit date: 04/29/2012  . Smokeless tobacco: Never Used  . Alcohol use No     Allergies   Contrast media [iodinated diagnostic agents] and  Latex   Review of Systems Review of Systems  Unable to perform ROS: Acuity of condition  Constitutional: Positive for fever.     Physical Exam Updated Vital Signs BP 132/85   Pulse 93   Temp (!) 102.5 F (39.2 C) (Rectal)   Resp (!) 33   SpO2 96%   Physical Exam CONSTITUTIONAL: Chronically ill appearing HEAD: Normocephalic/atraumatic EYES: EOMI ENMT: Mucous membranes moist NECK: supple no meningeal signs CV: S1/S2 noted, no murmurs/rubs/gallops noted, tachycardic LUNGS: mild tachypnea noted, coarse BS noted bilatearlly ABDOMEN: soft, nontender  GU:no erythema/tenderness noted, nurse chaperone present NEURO: Pt is somnolent but arousable, appears confused but she can follow commands   EXTREMITIES: pulses normal/equal, full ROM, s/p left BKA No wounds noted to extremities Dialysis access to right UE, thrill noted SKIN: warm, color normal, incision from port-a-cath noted to left chest PSYCH: unable to assess   ED Treatments / Results  Labs (all labs ordered are listed, but only abnormal results are displayed) Labs Reviewed  COMPREHENSIVE METABOLIC PANEL - Abnormal; Notable for the following:       Result Value   Chloride 100 (*)    Glucose, Bld 209 (*)    BUN 28 (*)    Creatinine, Ser 4.15 (*)    Calcium 7.6 (*)    Albumin 2.3 (*)    AST 139 (*)    ALT 12 (*)    Alkaline Phosphatase 181 (*)    GFR calc non Af Amer 10 (*)    GFR calc Af Amer 11 (*)    All other components within normal limits  CBC WITH DIFFERENTIAL/PLATELET - Abnormal; Notable for the following:    WBC 17.9 (*)    RBC 3.20 (*)    Hemoglobin 8.1 (*)    HCT 26.6 (*)    MCH 25.3 (*)    RDW 17.4 (*)    Neutro Abs 16.6 (*)    All other components within normal limits  CULTURE, BLOOD (ROUTINE X 2)  CULTURE, BLOOD (ROUTINE X 2)  URINALYSIS, ROUTINE W REFLEX MICROSCOPIC  I-STAT CG4 LACTIC ACID, ED  I-STAT CG4 LACTIC ACID, ED    EKG  EKG Interpretation None       Radiology Ct Abdomen  Pelvis  Wo Contrast  Result Date: 02/17/2017 CLINICAL DATA:  Abdominal distention. History of metastatic lung cancer. EXAM: CT ABDOMEN AND PELVIS WITHOUT CONTRAST TECHNIQUE: Multidetector CT imaging of the abdomen and pelvis was performed following the standard protocol without IV contrast. COMPARISON:  07/06/2016 and 06/27/2016 as well as PET-CT 12/30/2016 FINDINGS: Lower chest: Lung bases unchanged. Calcified plaque over the coronary arteries unchanged. Calcified plaque over the descending thoracic aorta. Hepatobiliary: Interval worsening of numerous hypodense liver metastases. Previous cholecystectomy. Biliary tree within normal. Pancreas: Within normal. Spleen: Within normal. Adrenals/Urinary Tract: Adrenal glands normal. Kidneys are normal in size with multiple hypo and hyperdense renal cortical lesions such unchanged likely simple and hemorrhagic cysts. No hydronephrosis. Several vascular calcifications over the corticomedullary junction bilaterally. Ureters and bladder are normal. Stomach/Bowel: Stomach and small bowel are normal. The appendix is normal. There is diverticulosis of the colon. Vascular/Lymphatic: Moderate calcified plaque over the abdominal aorta and iliac arteries. No definite adenopathy. Reproductive: Unremarkable. Other: No significant free fluid or focal inflammatory change. Musculoskeletal: Mild degenerative change of the spine and hips. Intramedullary rod over the left femur. IMPRESSION: No acute findings in the abdomen/pelvis. Interval worsening of numerous liver metastases from patient's known lung cancer. Multiple stable hyper and hypodense renal cortical lesions likely simple and hemorrhagic cysts. Colonic diverticulosis. Atherosclerotic coronary artery disease. Aortic Atherosclerosis (ICD10-I70.0). Electronically Signed   By: Marin Olp M.D.   On: 02/17/2017 18:35   Dg Chest Portable 1 View  Result Date: 02/11/2017 CLINICAL DATA:  Altered mental status and difficulty  breathing EXAM: PORTABLE CHEST 1 VIEW COMPARISON:  Chest radiographs 02/17/2017 FINDINGS: Unchanged appearance of left hilar mass. Developing opacities in the right lower lobe are unchanged. Heart remains enlarged. Mild pulmonary edema. Left subclavian approach Port-A-Cath is unchanged with tip in the lower SVC. IMPRESSION: Unchanged chest radiograph with left hilar mass and developing right basilar opacities. Mild pulmonary edema. Electronically Signed   By: Ulyses Jarred M.D.   On: 02/10/2017 03:27   Dg Chest Port 1 View  Result Date: 02/18/2017 CLINICAL DATA:  Altered mental status. Patient is warm to the touch. Labored breathing. Dialysis and cancer patient. EXAM: PORTABLE CHEST 1 VIEW COMPARISON:  02/13/2017 FINDINGS: Power port type central venous catheter with tip over the mid SVC region. No change in position since previous study. Cardiac enlargement. Left perihilar mass lesion is unchanged since previous study. Developing infiltration or atelectasis in the right lung base. This could indicate a developing pneumonia. No blunting of costophrenic angles. No pneumothorax. Surgical clips in the left axilla. Degenerative changes in the shoulders. IMPRESSION: Developing infiltration or atelectasis in the right lung base. Left hilar mass without change since prior study. Cardiac enlargement. Electronically Signed   By: Lucienne Capers M.D.   On: 02/12/2017 01:28    Procedures Procedures  CRITICAL CARE Performed by: Sharyon Cable Total critical care time: 40 minutes Critical care time was exclusive of separately billable procedures and treating other patients. Critical care was necessary to treat or prevent imminent or life-threatening deterioration. Critical care was time spent personally by me on the following activities: development of treatment plan with patient and/or surrogate as well as nursing, discussions with consultants, evaluation of patient's response to treatment, examination of  patient, obtaining history from patient or surrogate, ordering and performing treatments and interventions, ordering and review of laboratory studies, ordering and review of radiographic studies, pulse oximetry and re-evaluation of patient's condition. PATIENT WITH SEPSIS AN RESPIRATORY FAILURE REQUIRING BIPAP AND ADMISSION   Medications Ordered in ED  Medications  sodium chloride 0.9 % bolus 1,000 mL (0 mLs Intravenous Stopped 02/15/2017 0301)    And  sodium chloride 0.9 % bolus 1,000 mL (0 mLs Intravenous Stopped 01/29/2017 0301)    And  sodium chloride 0.9 % bolus 500 mL (not administered)  piperacillin-tazobactam (ZOSYN) IVPB 3.375 g (0 g Intravenous Stopped 02/17/2017 0127)  vancomycin (VANCOCIN) IVPB 1000 mg/200 mL premix (0 mg Intravenous Stopped 02/22/2017 0304)  acetaminophen (TYLENOL) suppository 650 mg (650 mg Rectal Given 02/16/2017 0101)     Initial Impression / Assessment and Plan / ED Course  I have reviewed the triage vital signs and the nursing notes.  Pertinent labs & imaging results that were available during my care of the patient were reviewed by me and considered in my medical decision making (see chart for details).     1:23 AM Pt ill appearing Code sepsis Unclear cause of fever, though bacteremia possible as she is a dialysis patient and also had recent catheter placed for chemo 3:56 AM PT MONITORED FOR SEVERAL HOURS SHE RECEIVED APPROXIMATELY 1000ML, MINIMAL CHANGE ON CXR [name] TACHYPNEIC, THOUGH NOW [name] MORE ALERT SHE WILL NEED DIALYSIS LATER TODAY WILL TRY BIPAP TO ASSIST WITH RESPIRATORY EFFORT THOUGH DOES NOT REQUIRE ANY FURTHER RESPIRATORY INTERVENTION  WILL ADMIT 4:36 AM D/w dr Darrick Meigs for admission   Final Clinical Impressions(s) / ED Diagnoses   Final diagnoses:  HCAP (healthcare-associated pneumonia)  Acute respiratory failure, unspecified whether with hypoxia or hypercapnia ([place])  ESRD (end stage renal disease) ([place])    New Prescriptions New  Prescriptions   No medications on file     Ripley Fraise, MD 02/20/2017 [phone]

## 2017-02-19 NOTE — Progress Notes (Signed)
Dr. Jerilee Hoh informed about temp going down and into see patient.  New orders noted. Will continue to monitor patient closely.

## 2017-02-19 NOTE — ED Triage Notes (Signed)
EMS called out due to altered mental status, pt warm to the touch, pt not answering questions, labored breathing, dialysis and cancer pt

## 2017-02-19 NOTE — ED Notes (Signed)
I-Stat Lactic: 1.15

## 2017-02-19 NOTE — ED Notes (Signed)
Purewick Catheter placed on patient

## 2017-02-19 NOTE — ED Notes (Signed)
EDP Dr. Christy Gentles advised of pt BP

## 2017-02-19 NOTE — H&P (Signed)
TRH H&P    Patient Demographics:    [name], is a 71 y.o. female  MRN: [id number]  DOB - 1945/09/12  Admit Date - 01/31/2017  Referring MD/NP/PA: Dr. Christy Gentles  Outpatient Primary MD for the patient is [name], MD  Patient coming from: Home  Chief Complaint  Patient presents with  . Code Sepsis      HPI:    [name]  is a 71 y.o. female, With history of COPD, squamous cell lung cancer stage IV on chemotherapy, hypothyroidism, end-stage renal disease on hemodialysis Monday Wednesday Friday, diabetes mellitus was brought to hospital with altered mental status and fever. Patient initially was unable to provide any history, she was given IV fluid bolus for possible sepsis from healthcare associated pneumonia. Patient mental status improved but patient bending to mild pulmonary edema. BiPAP placed.  Patient is alert and oriented 3 at this time, she denies any pain, no chest pain. Has mild shortness of breath. She denies nausea and vomiting but had diarrhea at home. Patient has been getting dialysis as scheduled Monday Wednesday Friday but has been getting shorter treatments As per nursing staff.       Review of systems:      All other systems reviewed and are negative.   With Past History of the following :    Past Medical History:  Diagnosis Date  . Arthritis    knee  . Bronchitis   . Cancer ([place]) 2018   Lung Cancer  . COPD (chronic obstructive pulmonary disease) ([place])   . Diabetes mellitus   . Diarrhea    not constant- frequent  . ESRD on hemodialysis ([place])    [facility]. Started HD November 26, 2009. 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  [place] Kitchen Hyperlipidemia   . Hypertension   . Hypothyroidism   . Leg pain   . Peripheral vascular disease ([place])   . 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 Retroperitoneal bleeding 06/2016  . Stroke  ([place])   . Thyroid disease       Past Surgical History:  Procedure Laterality Date  . AMPUTATION Left 07/25/2012   Procedure: LEFT AMPUTATION BELOW KNEE;  Surgeon: Newt Minion, MD;  Location: [facility];  Service: Orthopedics;  Laterality: Left;  Left Below Knee Amputation  . AV FISTULA PLACEMENT  05/18/2010  . BASCILIC VEIN TRANSPOSITION Right 11/23/2012   Procedure: RIGHT BASCILIC VEIN TRANSPOSITION ;  Surgeon: Angelia Mould, MD;  Location: [facility];  Service: Vascular;  Laterality: Right;  . CHOLECYSTECTOMY    . DG AV DIALYSIS SHUNT ACCESS EXIST*R* OR     working right HD catheter  . FEMUR IM NAIL  05/20/2012   Procedure: INTRAMEDULLARY (IM) RETROGRADE FEMORAL NAILING;  Surgeon: Rozanna Box, MD;  Location: [facility];  Service: Orthopedics;  Laterality: Left;  . IR GENERIC HISTORICAL  07/07/2016   IR US GUIDE VASC ACCESS LEFT 07/07/2016 Markus Daft, MD [facility]  . IR GENERIC HISTORICAL  07/07/2016  IR FLUORO GUIDE CV LINE LEFT 07/07/2016 Markus Daft, MD [facility]  . PORTACATH PLACEMENT Left 02/13/2017   Procedure: INSERTION PORT-A-CATH LEFT SUBCLAVIAN;  Surgeon: Aviva Signs, MD;  Location: [facility];  Service: General;  Laterality: Left;      Social History:      Social History  Substance Use Topics  . Smoking status: Former Smoker    Packs/day: 0.50    Years: 30.00    Types: Cigarettes    Quit date: 04/29/2012  . Smokeless tobacco: Never Used  . Alcohol use No       Family History :     Family History  Problem Relation Age of Onset  . Diabetes Mother       Home Medications:   Prior to Admission medications   Medication Sig Start Date End Date Taking? Authorizing Provider  acetaminophen (TYLENOL) 500 MG tablet Take 2,000 mg by mouth 3 (three) times daily as needed for moderate pain or headache.    [provider]  amLODipine (NORVASC) 5 MG tablet Take 5 mg by mouth daily.    [provider]  calcium acetate (PHOSLO) 667 MG capsule Take 1 capsule  (667 mg total) by mouth 2 (two) times daily with a meal. Patient taking differently: Take 1,334 mg by mouth 3 (three) times daily after meals.  06/04/12   [name], [name], MD  CARBOPLATIN IV Inject into the vein. Every 3 weeks    [provider]  dexamethasone (DECADRON) 4 MG tablet Take 2 tablets (8 mg total) by mouth daily. Start the day after chemotherapy for 2 days. 02/01/17   [name], MD  diclofenac sodium (VOLTAREN) 1 % GEL Apply 2 g topically daily as needed (for pain).  11/24/16   [provider]  insulin glargine (LANTUS) 100 UNIT/ML injection Inject 0.1 mLs (10 Units total) into the skin at bedtime. Patient taking differently: Inject 20 Units into the skin at bedtime.  07/12/16   Eugenie Filler, MD  insulin lispro (HUMALOG) 100 UNIT/ML injection Inject 8 Units into the skin daily after breakfast.    [provider]  levothyroxine (SYNTHROID, LEVOTHROID) 75 MCG tablet Take 75 mcg by mouth daily.      [provider]  lidocaine-prilocaine (EMLA) cream Apply to affected area once Patient taking differently: Apply 1 application topically as directed. Apply to affected area once 02/01/17   [name], MD  linagliptin (TRADJENTA) 5 MG TABS tablet Take 5 mg by mouth daily.    [provider]  metoCLOPramide (REGLAN) 5 MG tablet Take 1-2 tablets (5-10 mg total) by mouth every 8 (eight) hours as needed (if ondansetron (ZOFRAN) ineffective.). 06/01/12   [name], [name], MD  Multiple Vitamins-Minerals (RENAL) TABS Take 1 tablet by mouth daily.    [provider]  ondansetron (ZOFRAN) 8 MG tablet Take 1 tablet (8 mg total) by mouth every 8 (eight) hours as needed for nausea or vomiting. 02/07/17   [name], MD  PACLitaxel (TAXOL IV) Inject into the vein. Every 3 weeks    [provider]  Pegfilgrastim (NEULASTA ONPRO [place]) Inject into the skin. Every 3 weeks    [provider]  prochlorperazine (COMPAZINE) 10 MG tablet Take 1  tablet (10 mg total) by mouth every 6 (six) hours as needed (Nausea or vomiting). 02/01/17   [name], MD     Allergies:     Allergies  Allergen Reactions  . Contrast Media [Iodinated Diagnostic Agents] Itching  . Latex  Itching     Physical Exam:   Vitals  Blood pressure (!) 125/54, pulse 87, temperature (!) 101.5 F (38.6 C), temperature source Rectal, resp. rate 15, SpO2 100 %.  1.  General: Patient currently on BiPAP  2. Psychiatric:  Intact judgement and  insight, awake alert, oriented x 3.  3. Neurologic: No focal neurological deficits, all cranial nerves intact.Strength 5/5 all 4 extremities, sensation intact all 4 extremities, plantars down going.  4. Eyes :  anicteric sclerae, moist conjunctivae with no lid lag. PERRLA.  5. ENMT:  Oropharynx clear with moist mucous membranes and good dentition  6. Neck:  supple, no cervical lymphadenopathy appriciated, No thyromegaly  7. Respiratory : Normal respiratory effort, decreased breath sounds bilaterally  8. Cardiovascular : RRR, no gallops, rubs or murmurs, no leg edema  9. Gastrointestinal:  Positive bowel sounds, abdomen soft, non-tender to palpation,no hepatosplenomegaly, no rigidity or guarding       10. Skin:  No cyanosis, normal texture and turgor, no rash, lesions or ulcers  11.Musculoskeletal:  Good muscle tone,  joints appear normal , no effusions,  normal range of motion    Data Review:    CBC  Recent Labs Lab 02/13/17 1118 02/15/17 0901 02/17/17 1945 02/03/2017 0034  WBC  --  10.3 28.8* 17.9*  HGB 9.9* 8.6* 7.9* 8.1*  HCT 29.0* 29.0* 25.9* 26.6*  PLT  --  261 249 208  MCV  --  84.3 83.0 83.1  MCH  --  25.0* 25.3* 25.3*  MCHC  --  29.7* 30.5 30.5  RDW  --  17.4* 17.3* 17.4*  LYMPHSABS  --  0.5* 0.1* 0.7  MONOABS  --  1.2* 0* 0.5  EOSABS  --  0.0 0 0.0  BASOSABS  --  0.0 0 0.0    ------------------------------------------------------------------------------------------------------------------  Chemistries   Recent Labs Lab 02/13/17 1118 02/15/17 0901 02/17/17 1945 01/28/2017 0034  NA 135 134* 135 139  K 4.1 3.3* 5.0 4.5  CL 97* 94* 97* 100*  CO2  --  27 23 26   GLUCOSE 92 67 308* 209*  BUN 48* 26* 45* 28*  CREATININE 7.90* 5.31* 6.67* 4.15*  CALCIUM  --  7.8* 7.4* 7.6*  AST  --  98*  --  139*  ALT  --  11*  --  12*  ALKPHOS  --  215*  --  181*  BILITOT  --  0.7  --  0.8   ------------------------------------------------------------------------------------------------------------------  ------------------------------------------------------------------------------------------------------------------ GFR: Estimated Creatinine Clearance: 12.3 mL/min (A) (by C-G formula based on SCr of 4.15 mg/dL (H)). Liver Function Tests:  Recent Labs Lab 02/15/17 0901 02/03/2017 0034  AST 98* 139*  ALT 11* 12*  ALKPHOS 215* 181*  BILITOT 0.7 0.8  PROT 6.9 7.0  ALBUMIN 2.3* 2.3*   No results for input(s): LIPASE, AMYLASE in the last 168 hours. No results for input(s): AMMONIA in the last 168 hours. Coagulation Profile: No results for input(s): INR, PROTIME in the last 168 hours. Cardiac Enzymes: No results for input(s): CKTOTAL, CKMB, CKMBINDEX, TROPONINI in the last 168 hours. BNP (last 3 results) No results for input(s): PROBNP in the last 8760 hours. HbA1C: No results for input(s): HGBA1C in the last 72 hours. CBG:  Recent Labs Lab 02/13/17 1219  GLUCAP 93   Lipid Profile: No results for input(s): CHOL, HDL, LDLCALC, TRIG, CHOLHDL, LDLDIRECT in the last 72 hours. Thyroid Function Tests: No results for input(s): TSH, T4TOTAL, FREET4, T3FREE, THYROIDAB in the last 72 hours. Anemia Panel: No  results for input(s): VITAMINB12, FOLATE, FERRITIN, TIBC, IRON, RETICCTPCT in the last 72  hours.  --------------------------------------------------------------------------------------------------------------- Urine analysis:    Component Value Date/Time   COLORURINE YELLOW 02/26/2012 2345   APPEARANCEUR CLEAR 02/26/2012 2345   LABSPEC 1.015 02/26/2012 2345   PHURINE 7.0 02/26/2012 2345   GLUCOSEU 250 (A) 02/26/2012 2345   HGBUR SMALL (A) 02/26/2012 2345   BILIRUBINUR NEGATIVE 02/26/2012 2345   KETONESUR NEGATIVE 02/26/2012 2345   PROTEINUR 100 (A) 02/26/2012 2345   UROBILINOGEN 0.2 02/26/2012 2345   NITRITE NEGATIVE 02/26/2012 2345   LEUKOCYTESUR SMALL (A) 02/26/2012 2345      Imaging Results:    Ct Abdomen Pelvis Wo Contrast  Result Date: 02/17/2017 CLINICAL DATA:  Abdominal distention. History of metastatic lung cancer. EXAM: CT ABDOMEN AND PELVIS WITHOUT CONTRAST TECHNIQUE: Multidetector CT imaging of the abdomen and pelvis was performed following the standard protocol without IV contrast. COMPARISON:  07/06/2016 and 06/27/2016 as well as PET-CT 12/30/2016 FINDINGS: Lower chest: Lung bases unchanged. Calcified plaque over the coronary arteries unchanged. Calcified plaque over the descending thoracic aorta. Hepatobiliary: Interval worsening of numerous hypodense liver metastases. Previous cholecystectomy. Biliary tree within normal. Pancreas: Within normal. Spleen: Within normal. Adrenals/Urinary Tract: Adrenal glands normal. Kidneys are normal in size with multiple hypo and hyperdense renal cortical lesions such unchanged likely simple and hemorrhagic cysts. No hydronephrosis. Several vascular calcifications over the corticomedullary junction bilaterally. Ureters and bladder are normal. Stomach/Bowel: Stomach and small bowel are normal. The appendix is normal. There is diverticulosis of the colon. Vascular/Lymphatic: Moderate calcified plaque over the abdominal aorta and iliac arteries. No definite adenopathy. Reproductive: Unremarkable. Other: No significant free fluid or  focal inflammatory change. Musculoskeletal: Mild degenerative change of the spine and hips. Intramedullary rod over the left femur. IMPRESSION: No acute findings in the abdomen/pelvis. Interval worsening of numerous liver metastases from patient's known lung cancer. Multiple stable hyper and hypodense renal cortical lesions likely simple and hemorrhagic cysts. Colonic diverticulosis. Atherosclerotic coronary artery disease. Aortic Atherosclerosis (ICD10-I70.0). Electronically Signed   By: Marin Olp M.D.   On: 02/17/2017 18:35   Dg Chest Portable 1 View  Result Date: 02/22/2017 CLINICAL DATA:  Altered mental status and difficulty breathing EXAM: PORTABLE CHEST 1 VIEW COMPARISON:  Chest radiographs 02/14/2017 FINDINGS: Unchanged appearance of left hilar mass. Developing opacities in the right lower lobe are unchanged. Heart remains enlarged. Mild pulmonary edema. Left subclavian approach Port-A-Cath is unchanged with tip in the lower SVC. IMPRESSION: Unchanged chest radiograph with left hilar mass and developing right basilar opacities. Mild pulmonary edema. Electronically Signed   By: Ulyses Jarred M.D.   On: 02/14/2017 03:27   Dg Chest Port 1 View  Result Date: 01/29/2017 CLINICAL DATA:  Altered mental status. Patient is warm to the touch. Labored breathing. Dialysis and cancer patient. EXAM: PORTABLE CHEST 1 VIEW COMPARISON:  02/13/2017 FINDINGS: Power port type central venous catheter with tip over the mid SVC region. No change in position since previous study. Cardiac enlargement. Left perihilar mass lesion is unchanged since previous study. Developing infiltration or atelectasis in the right lung base. This could indicate a developing pneumonia. No blunting of costophrenic angles. No pneumothorax. Surgical clips in the left axilla. Degenerative changes in the shoulders. IMPRESSION: Developing infiltration or atelectasis in the right lung base. Left hilar mass without change since prior study. Cardiac  enlargement. Electronically Signed   By: Lucienne Capers M.D.   On: 02/25/2017 01:28    My personal review of EKG: Rhythm NSR  Assessment & Plan:    Active Problems:   Sepsis with metabolic encephalopathy ([place])   ESRD (end stage renal disease) ([place])   Hypothyroidism   Community acquired pneumonia   Squamous cell carcinoma of lung, stage IV ([place])   Sepsis ([place])   1. Altered mental status-improved after patient started on antibiotics, on BiPAP. Chest x-ray showed developing pneumonia. Currently patient is on vancomycin and Zosyn. [name] mentally back to her baseline. 2. Sepsis due to healthcare associated pneumonia- chest showed developing pneumonia. Continue vancomycin and Zosyn. Follow blood culture results. 3. End-stage renal disease-patient is on dialysis Monday Wednesday Friday. She might require dialysis as she went into pulmonary edema after she was given IV fluids. We'll consult nephrology in a.m. 4. Diabetes mellitus- will start sliding scale insulin with NovoLog. Continue Lantus 20 units subcutaneous daily. 5. Squamous cell carcinoma of lung, stage IV-CT abdomen pelvis done 2 days ago shows stable liver metastasis, patient is on chemotherapy as outpatient. 6. Hypothyroidism-continue Synthroid.   DVT Prophylaxis-   Lovenox   AM Labs Ordered, also please review Full Orders  Family Communication: Admission, patients condition and plan of care including tests being ordered have been discussed with the patient  who indicate understanding and agree with the plan and Code Status.  Code Status:  Full code  Admission status: Inpatient    Time spent in minutes : 60 minutes   Roux Brandy S M.D on 02/18/2017 at 5:02 AM  Between 7am to 7pm - Pager - [phone]. After 7pm go to [email or web address] - password [facility]  [facility] - Office  [phone]

## 2017-02-19 NOTE — Progress Notes (Signed)
Pharmacy Antibiotic Note  [name] is a 71 y.o. female admitted on 01/30/2017 with sepsis.  Pharmacy has been consulted for vancomycin and zosyn dosing.Initial dose of zosyn 3.375gm and vancomycin 1gm given earlier today  Plan: Vancomycin 750 mg IV after each HD Zosyn 3.375 gm IV q12 hours F/u cultures and clinical course  Height: 5\' 3"  (160 cm) Weight: 158 lb 8.2 oz (71.9 kg) IBW/kg (Calculated) : 52.4  Temp (24hrs), Avg:101.1 F (38.4 C), Min:99.5 F (37.5 C), Max:102.5 F (39.2 C)   Recent Labs Lab 02/13/17 1118 02/15/17 0901 02/17/17 1945 02/15/2017 0034 02/17/2017 0041 02/13/2017 0349  WBC  --  10.3 28.8* 17.9*  --   --   CREATININE 7.90* 5.31* 6.67* 4.15*  --   --   LATICACIDVEN  --   --   --   --  1.52 1.15    Estimated Creatinine Clearance: 11.8 mL/min (A) (by C-G formula based on SCr of 4.15 mg/dL (H)).    Allergies  Allergen Reactions  . Contrast Media [Iodinated Diagnostic Agents] Itching  . Latex Itching     Thank you for allowing pharmacy to be a part of this patient's care.  Excell Seltzer Poteet 02/11/2017 10:29 AM

## 2017-02-19 NOTE — Progress Notes (Signed)
Patient on 3LNC with 02 saturations at 93%. Patient in no distress. BIPAP taken out of room. RT will continue to monitor.

## 2017-02-19 NOTE — Progress Notes (Signed)
Dr. Jerilee Hoh informed about patient fever new orders noted. Placed cool wash cloth on forehead and limited blankets on patient.  Will continue to monitor patient.

## 2017-02-19 NOTE — Progress Notes (Signed)
Transported on BiPAP to unit 14/8 [id number].

## 2017-02-19 NOTE — ED Notes (Signed)
CONTACT INFORMATION [facility]  [phone]

## 2017-02-19 NOTE — Progress Notes (Signed)
Patient seen and examined, database reviewed. No family members at bedside. Patient admitted a few hours ago due to confusion and fever. She was found to have pneumonia on chest x-ray. She has a history of end-stage renal disease and is dialyzed on Monday Wednesday and Friday. She did require BiPAP initially however has been weaned successfully to nasal cannula. She did receive sepsis driven fluid protocol and I suspect that this given her end-stage renal disease has caused some pulmonary edema and increased oxygen requirement. I see no need for emergent dialysis today, although will consult nephrology to keep her on schedule for dialysis tomorrow. Continue vancomycin and Zosyn, await culture data. [name] more awake today. Will keep in ICU and continue to monitor closely today.  Domingo Mend, MD [facility] Pager: [phone]

## 2017-02-19 NOTE — ED Notes (Signed)
Report to Thayer Headings, RN ICU

## 2017-02-20 ENCOUNTER — Encounter (HOSPITAL_COMMUNITY): Payer: Self-pay | Admitting: Primary Care

## 2017-02-20 DIAGNOSIS — J9621 Acute and chronic respiratory failure with hypoxia: Secondary | ICD-10-CM

## 2017-02-20 DIAGNOSIS — Z515 Encounter for palliative care: Secondary | ICD-10-CM

## 2017-02-20 DIAGNOSIS — Z7189 Other specified counseling: Secondary | ICD-10-CM

## 2017-02-20 LAB — RENAL FUNCTION PANEL
Albumin: 1.9 g/dL — ABNORMAL LOW (ref 3.5–5.0)
Anion gap: 15 (ref 5–15)
BUN: 50 mg/dL — AB (ref 6–20)
CHLORIDE: 102 mmol/L (ref 101–111)
CO2: 24 mmol/L (ref 22–32)
CREATININE: 6.31 mg/dL — AB (ref 0.44–1.00)
Calcium: 7.2 mg/dL — ABNORMAL LOW (ref 8.9–10.3)
GFR calc Af Amer: 7 mL/min — ABNORMAL LOW (ref >60)
GFR, EST NON AFRICAN AMERICAN: 6 mL/min — AB (ref >60)
Glucose, Bld: 76 mg/dL (ref 65–99)
POTASSIUM: 4.4 mmol/L (ref 3.5–5.1)
Phosphorus: 6.2 mg/dL — ABNORMAL HIGH (ref 2.5–4.6)
Sodium: 141 mmol/L (ref 135–145)

## 2017-02-20 LAB — CBC
HEMATOCRIT: 23.7 % — AB (ref 36.0–46.0)
Hemoglobin: 7.2 g/dL — ABNORMAL LOW (ref 12.0–15.0)
MCH: 24.9 pg — AB (ref 26.0–34.0)
MCHC: 30.4 g/dL (ref 30.0–36.0)
MCV: 82 fL (ref 78.0–100.0)
Platelets: 113 10*3/uL — ABNORMAL LOW (ref 150–400)
RBC: 2.89 MIL/uL — AB (ref 3.87–5.11)
RDW: 17.1 % — AB (ref 11.5–15.5)
WBC: 3.7 10*3/uL — AB (ref 4.0–10.5)

## 2017-02-20 LAB — GLUCOSE, CAPILLARY
Glucose-Capillary: 97 mg/dL (ref 65–99)
Glucose-Capillary: 75 mg/dL (ref 65–99)
Glucose-Capillary: 74 mg/dL (ref 65–99)
Glucose-Capillary: 146 mg/dL — ABNORMAL HIGH (ref 65–99)

## 2017-02-20 LAB — MRSA PCR SCREENING: MRSA BY PCR: NEGATIVE

## 2017-02-20 MED ORDER — SODIUM CHLORIDE 0.9 % IV SOLN: 100.0000 mL | INTRAVENOUS | Status: DC | PRN

## 2017-02-20 MED ORDER — MIDODRINE HCL 5 MG PO TABS
10.0000 mg | ORAL_TABLET | ORAL | Status: DC | PRN
  Administered 2017-02-20 – 2017-02-22 (×2): 10 mg via ORAL
  Filled 2017-02-20 (×3): qty 2

## 2017-02-20 MED ORDER — SODIUM CHLORIDE 0.9 % IV SOLN
100.0000 mL | INTRAVENOUS | Status: DC | PRN
Start: 2017-02-20 — End: 2017-02-25

## 2017-02-20 MED ORDER — ALPRAZOLAM 0.5 MG PO TABS
0.5000 mg | ORAL_TABLET | Freq: Once | ORAL | Status: AC
  Administered 2017-02-20: 0.5 mg via ORAL
  Filled 2017-02-20: qty 1

## 2017-02-20 MED ORDER — LIDOCAINE-PRILOCAINE 2.5-2.5 % EX CREA
1.0000 "application " | TOPICAL_CREAM | CUTANEOUS | Status: DC | PRN
  Filled 2017-02-20: qty 5

## 2017-02-20 MED ORDER — PENTAFLUOROPROP-TETRAFLUOROETH EX AERO
1.0000 "application " | INHALATION_SPRAY | CUTANEOUS | Status: DC | PRN
  Filled 2017-02-20: qty 30

## 2017-02-20 MED ORDER — LIDOCAINE HCL (PF) 1 % IJ SOLN
5.0000 mL | INTRAMUSCULAR | Status: DC | PRN
Start: 2017-02-20 — End: 2017-02-25

## 2017-02-20 MED ORDER — ALTEPLASE 2 MG IJ SOLR
2.0000 mg | Freq: Once | INTRAMUSCULAR | Status: DC | PRN
  Filled 2017-02-20: qty 2

## 2017-02-20 MED ORDER — HEPARIN SODIUM (PORCINE) 1000 UNIT/ML DIALYSIS
1000.0000 [IU] | INTRAMUSCULAR | Status: DC | PRN
  Administered 2017-02-24: 1000 [IU] via INTRAVENOUS_CENTRAL
  Filled 2017-02-20 (×2): qty 1

## 2017-02-20 NOTE — Progress Notes (Signed)
Pt continuously pulling [place] off. States she is breathing fine and does not want [place]. [place] remove. Pt in no distress. Saturating at 99% without [place].

## 2017-02-20 NOTE — Progress Notes (Signed)
Pt found with frothy, thick sputum and desaturated to 78%. Suctioned mouth and called respiratory. Pt back on BiPap on 80 FiO2. Currently saturating at 95% Paged Dr. Darrick Meigs.

## 2017-02-20 NOTE — Progress Notes (Signed)
PROGRESS NOTE    [name]  RCV:[id number] DOB: [birth year] DOA: 02/18/2017 PCP: [name], MD     Brief Narrative:  71 year old woman admitted from home on 9/23 due to altered mental status and fever. She has multiple medical comorbidities including end-stage renal disease on hemodialysis, squamous cell cancer of the lung currently undergoing chemotherapy, COPD, left issues. She received fluid boluses due to sepsis protocol and admission and developed pulmonary edema and has been placed on BiPAP overnight. [name] for dialysis today. She was found to have  pneumonia and was started on antibiotics.   Assessment & Plan:   Active Problems:   Sepsis with metabolic encephalopathy (HCC)   ESRD (end stage renal disease) ([place])   Hypothyroidism   Community acquired pneumonia   Squamous cell carcinoma of lung, stage IV (HCC)   Sepsis ([place])   Goals of care, counseling/discussion   Palliative care encounter   Sepsis with acute metabolic encephalopathy -Presumed due to hospital-acquired pneumonia. -Encephalopathy has improved overnight. -Also developed acute hypoxemic respiratory failure, I believe in response to fluids that she received in ED due to sepsis protocol. [name] having emergent dialysis today. She has required BiPAP overnight due to pulmonary edema. -Continue vancomycin and Zosyn, blood cultures remain negative to date. -Leukocytosis is improving. If blood cultures positive will need to consider line infection. -Is receiving dialysis today for end-stage renal disease. -Given her active lung cancer and severe acute illness I believe it is appropriate to involve palliative care to further determine goals of care.  Hypothyroidism -Continue Synthroid  Diabetes -Well controlled, continue current regimen.     DVT prophylaxis: Subcutaneous heparin Code Status: Full code Family Communication: Daughter, granddaughter and grandson at bedside updated on plan of care and  questions answered Disposition Plan: Pending medical improvement  Consultants:   Nephrology  Procedures:   None  Antimicrobials:  Anti-infectives    Start     Dose/Rate Route Frequency Ordered Stop   02/20/17 1600  vancomycin (VANCOCIN) IVPB 750 mg/150 ml premix     750 mg 150 mL/hr over 60 Minutes Intravenous Every M-W-F (Hemodialysis) 02/12/2017 1031     02/10/2017 1200  piperacillin-tazobactam (ZOSYN) IVPB 3.375 g     3.375 g 12.5 mL/hr over 240 Minutes Intravenous Every 12 hours 02/16/2017 0757     02/03/2017 0100  piperacillin-tazobactam (ZOSYN) IVPB 3.375 g     3.375 g 100 mL/hr over 30 Minutes Intravenous  Once 01/30/2017 0054 01/31/2017 0127   02/13/2017 0100  vancomycin (VANCOCIN) IVPB 1000 mg/200 mL premix     1,000 mg 200 mL/hr over 60 Minutes Intravenous  Once 02/02/2017 0054 02/18/2017 0304       Subjective: On BiPAP, opens eyes to voice  Objective: Vitals:   02/20/17 1500 02/20/17 1532 02/20/17 1544 02/20/17 1600  BP: (!) 97/40   (!) 84/41  Pulse:   69 70  Resp: (!) 25   (!) 26  Temp:  99.6 F (37.6 C)    TempSrc:  Oral    SpO2:   100% 98%  Weight:      Height:        Intake/Output Summary (Last 24 hours) at 02/20/17 1628 Last data filed at 02/01/2017 2206  Gross per 24 hour  Intake               53 ml  Output                0 ml  Net  53 ml   Filed Weights   02/24/2017 0652  Weight: 71.9 kg (158 lb 8.2 oz)    Examination:  General exam: Opens eyes to voice, on BiPAP Respiratory system: Bilateral crackles Cardiovascular system:RRR. No murmurs, rubs, gallops. Gastrointestinal system: Abdomen is nondistended, soft and nontender. No organomegaly or masses felt. Normal bowel sounds heard. Central nervous system: Alert and oriented. No focal neurological deficits. Extremities: Status post left BKA, right 1+ edema Skin: No rashes, lesions or ulcers Psychiatry: Unable to assess given current mental state    Data Reviewed: I have personally  reviewed following labs and imaging studies  CBC:  Recent Labs Lab 02/15/17 0901 02/17/17 1945 02/08/2017 0034  WBC 10.3 28.8* 17.9*  NEUTROABS 8.6* 28.3* 16.6*  HGB 8.6* 7.9* 8.1*  HCT 29.0* 25.9* 26.6*  MCV 84.3 83.0 83.1  PLT 261 249 332   Basic Metabolic Panel:  Recent Labs Lab 02/15/17 0901 02/17/17 1945 02/03/2017 0034  NA 134* 135 139  K 3.3* 5.0 4.5  CL 94* 97* 100*  CO2 27 23 26   GLUCOSE 67 308* 209*  BUN 26* 45* 28*  CREATININE 5.31* 6.67* 4.15*  CALCIUM 7.8* 7.4* 7.6*   GFR: Estimated Creatinine Clearance: 11.8 mL/min (A) (by C-G formula based on SCr of 4.15 mg/dL (H)). Liver Function Tests:  Recent Labs Lab 02/15/17 0901 02/16/2017 0034  AST 98* 139*  ALT 11* 12*  ALKPHOS 215* 181*  BILITOT 0.7 0.8  PROT 6.9 7.0  ALBUMIN 2.3* 2.3*   No results for input(s): LIPASE, AMYLASE in the last 168 hours. No results for input(s): AMMONIA in the last 168 hours. Coagulation Profile: No results for input(s): INR, PROTIME in the last 168 hours. Cardiac Enzymes: No results for input(s): CKTOTAL, CKMB, CKMBINDEX, TROPONINI in the last 168 hours. BNP (last 3 results) No results for input(s): PROBNP in the last 8760 hours. HbA1C:  Recent Labs  02/08/2017 0720  HGBA1C 5.8*   CBG:  Recent Labs Lab 02/12/2017 1125 02/22/2017 1703 02/02/2017 2203 02/20/17 0708 02/20/17 1102  GLUCAP 120* 103* 102* 97 75   Lipid Profile: No results for input(s): CHOL, HDL, LDLCALC, TRIG, CHOLHDL, LDLDIRECT in the last 72 hours. Thyroid Function Tests: No results for input(s): TSH, T4TOTAL, FREET4, T3FREE, THYROIDAB in the last 72 hours. Anemia Panel: No results for input(s): VITAMINB12, FOLATE, FERRITIN, TIBC, IRON, RETICCTPCT in the last 72 hours. Urine analysis:    Component Value Date/Time   COLORURINE YELLOW 02/26/2012 [place] 02/26/2012 2345   LABSPEC 1.015 02/26/2012 2345   PHURINE 7.0 02/26/2012 2345   GLUCOSEU 250 (A) 02/26/2012 2345   HGBUR SMALL  (A) 02/26/2012 2345   BILIRUBINUR NEGATIVE 02/26/2012 2345   KETONESUR NEGATIVE 02/26/2012 2345   PROTEINUR 100 (A) 02/26/2012 2345   UROBILINOGEN 0.2 02/26/2012 2345   NITRITE NEGATIVE 02/26/2012 2345   LEUKOCYTESUR SMALL (A) 02/26/2012 2345   Sepsis Labs: @LABRCNTIP (procalcitonin:4,lacticidven:4)  ) Recent Results (from the past 240 hour(s))  Blood Culture (routine x 2)     Status: None (Preliminary result)   Collection Time: 02/01/2017 12:40 AM  Result Value Ref Range Status   Specimen Description BLOOD LEFT HAND  Final   Special Requests   Final    BOTTLES DRAWN AEROBIC AND ANAEROBIC Blood Culture adequate volume   Culture NO GROWTH 1 DAY  Final   Report Status PENDING  Incomplete  Blood Culture (routine x 2)     Status: None (Preliminary result)   Collection Time: 02/13/2017 12:48 AM  Result Value Ref Range Status   Specimen Description RIGHT ANTECUBITAL  Final   Special Requests   Final    BOTTLES DRAWN AEROBIC ONLY Blood Culture results may not be optimal due to an inadequate volume of blood received in culture bottles   Culture NO GROWTH 1 DAY  Final   Report Status PENDING  Incomplete  MRSA PCR Screening     Status: None   Collection Time: 02/01/2017  7:00 AM  Result Value Ref Range Status   MRSA by PCR NEGATIVE NEGATIVE Final    Comment:        The GeneXpert MRSA Assay (FDA approved for NASAL specimens only), is one component of a comprehensive MRSA colonization surveillance program. It is not intended to diagnose MRSA infection nor to guide or monitor treatment for MRSA infections.          Radiology Studies: Dg Chest Portable 1 View  Result Date: 02/08/2017 CLINICAL DATA:  Altered mental status and difficulty breathing EXAM: PORTABLE CHEST 1 VIEW COMPARISON:  Chest radiographs 01/28/2017 FINDINGS: Unchanged appearance of left hilar mass. Developing opacities in the right lower lobe are unchanged. Heart remains enlarged. Mild pulmonary edema. Left subclavian  approach Port-A-Cath is unchanged with tip in the lower SVC. IMPRESSION: Unchanged chest radiograph with left hilar mass and developing right basilar opacities. Mild pulmonary edema. Electronically Signed   By: Ulyses Jarred M.D.   On: 01/30/2017 03:27   Dg Chest Port 1 View  Result Date: 02/10/2017 CLINICAL DATA:  Altered mental status. Patient is warm to the touch. Labored breathing. Dialysis and cancer patient. EXAM: PORTABLE CHEST 1 VIEW COMPARISON:  02/13/2017 FINDINGS: Power port type central venous catheter with tip over the mid SVC region. No change in position since previous study. Cardiac enlargement. Left perihilar mass lesion is unchanged since previous study. Developing infiltration or atelectasis in the right lung base. This could indicate a developing pneumonia. No blunting of costophrenic angles. No pneumothorax. Surgical clips in the left axilla. Degenerative changes in the shoulders. IMPRESSION: Developing infiltration or atelectasis in the right lung base. Left hilar mass without change since prior study. Cardiac enlargement. Electronically Signed   By: Lucienne Capers M.D.   On: 02/01/2017 01:28        Scheduled Meds: . heparin  5,000 Units Subcutaneous Q8H  . insulin aspart  0-9 Units Subcutaneous TID WC  . insulin glargine  20 Units Subcutaneous QHS  . levothyroxine  75 mcg Oral QAC breakfast  . sodium chloride flush  3 mL Intravenous Q12H   Continuous Infusions: . sodium chloride    . sodium chloride    . sodium chloride    . piperacillin-tazobactam (ZOSYN)  IV Stopped (02/20/17 1300)  . vancomycin       LOS: 1 day    Critical care time spent: 65 minutes. Greater than 50% of this time was spent in direct contact with the patient coordinating care.     Lelon Frohlich, MD [facility] Pager [phone]  If 7PM-7AM, please contact night-coverage [email or web address] Password [facility] 02/20/2017, 4:28 PM

## 2017-02-20 NOTE — Plan of Care (Signed)
Problem: Safety: Goal: Ability to remain free from injury will improve Outcome: Progressing Patient resting in bed with lowest postion, siderails up x3, family presence encouraged.

## 2017-02-20 NOTE — Progress Notes (Addendum)
While turning pt to clean and change sheet, and administer tylenol suppository, pt went into a 7 beat SVT. Handled positioning well. HOB elevated. Pt laying supine. Still refusing to wear [place]. Saturating at 99% with no difficulty. Pt resting.

## 2017-02-20 NOTE — Progress Notes (Signed)
Pt continues to be anxious and restless. Desaturated to low 90's high 80's. [place] 2L placed back on. Saturated to 96% Dr. Darrick Meigs paged, 0.5 mg xanax ordered.

## 2017-02-20 NOTE — Consult Note (Signed)
Reason for Consult: End-stage renal disease Referring Physician: Dr. Jiles Harold [name] is an 71 y.o. female.  HPI: She the patient was history of diabetes, squamous cell cancer of the lung, COPD and end-stage renal disease presently brought to the hospital because of fever and altered mental status. Since patient at this moment seems to be somewhat sleepy most of the information was received from [name] was at bedside. According to [name] patient was having some chronic diarrhea and abdominal pain. [name] also receiving some chemotherapy for Sheila lung cancer. Because of Sheila diarrhea she was not able to complete Sheila dialysis treatment. Sheila regular dialysis days are Monday Wednesday Friday. Since she missed Sheila dialysis on Friday patient was last dialyzed on Saturday. After she went home patient becomes more confused and lethargic and hence brought to the emergency room. Presently patient was found to have sepsis and oral antibiotics. At this moment unable to get any additional history from patient.  Past Medical History:  Diagnosis Date  . Arthritis    knee  . Bronchitis   . Cancer ([place]) 2018   Lung Cancer  . COPD (chronic obstructive pulmonary disease) ([place])   . Diabetes mellitus   . Diarrhea    not constant- frequent  . ESRD on hemodialysis ([place])    [facility] [place] [facility]. Started HD November 26, 2009. 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  [place] Hyperlipidemia   . Hypertension   . Hypothyroidism   . Leg pain   . Peripheral vascular disease ([place])   . 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 Retroperitoneal bleeding 06/2016  . Stroke ([place])   . Thyroid disease     Past Surgical History:  Procedure Laterality Date  . AMPUTATION Left 07/25/2012   Procedure: LEFT AMPUTATION BELOW KNEE;  Surgeon: Newt Minion, MD;  Location: [facility];  Service: Orthopedics;  Laterality: Left;  Left Below Knee Amputation  . AV FISTULA PLACEMENT  05/18/2010  .  BASCILIC VEIN TRANSPOSITION Right 11/23/2012   Procedure: RIGHT BASCILIC VEIN TRANSPOSITION ;  Surgeon: Angelia Mould, MD;  Location: [facility];  Service: Vascular;  Laterality: Right;  . CHOLECYSTECTOMY    . DG AV DIALYSIS SHUNT ACCESS EXIST*R* OR     working right HD catheter  . FEMUR IM NAIL  05/20/2012   Procedure: INTRAMEDULLARY (IM) RETROGRADE FEMORAL NAILING;  Surgeon: Rozanna Box, MD;  Location: [facility];  Service: Orthopedics;  Laterality: Left;  . IR GENERIC HISTORICAL  07/07/2016   IR US GUIDE VASC ACCESS LEFT 07/07/2016 Markus Daft, MD [facility]  . IR GENERIC HISTORICAL  07/07/2016   IR FLUORO GUIDE CV LINE LEFT 07/07/2016 Markus Daft, MD [facility]  . PORTACATH PLACEMENT Left 02/13/2017   Procedure: INSERTION PORT-A-CATH LEFT SUBCLAVIAN;  Surgeon: Aviva Signs, MD;  Location: [facility];  Service: General;  Laterality: Left;    Family History  Problem Relation Age of Onset  . Diabetes Mother     Social History:  reports that she quit smoking about 4 years ago. Sheila smoking use included Cigarettes. She has a 15.00 pack-year smoking history. She has never used smokeless tobacco. She reports that she does not drink alcohol or use drugs.  Allergies:  Allergies  Allergen Reactions  . Contrast Media [Iodinated Diagnostic Agents] Itching  . Latex Itching    Medications: I have reviewed the patient's current medications.  Results for orders placed or performed during the  hospital encounter of 01/29/2017 (from the past 48 hour(s))  Comprehensive metabolic panel     Status: Abnormal   Collection Time: 02/12/2017 12:34 AM  Result Value Ref Range   Sodium 139 135 - 145 mmol/L   Potassium 4.5 3.5 - 5.1 mmol/L   Chloride 100 (L) 101 - 111 mmol/L   CO2 26 22 - 32 mmol/L   Glucose, Bld 209 (H) 65 - 99 mg/dL   BUN 28 (H) 6 - 20 mg/dL   Creatinine, Ser 4.15 (H) 0.44 - 1.00 mg/dL    Comment: DELTA CHECK NOTED   Calcium 7.6 (L) 8.9 - 10.3 mg/dL   Total Protein 7.0 6.5 - 8.1 g/dL   Albumin  2.3 (L) 3.5 - 5.0 g/dL   AST 139 (H) 15 - 41 U/L   ALT 12 (L) 14 - 54 U/L   Alkaline Phosphatase 181 (H) 38 - 126 U/L   Total Bilirubin 0.8 0.3 - 1.2 mg/dL   GFR calc non Af Amer 10 (L) >60 mL/min   GFR calc Af Amer 11 (L) >60 mL/min    Comment: (NOTE) The eGFR has been calculated using the CKD EPI equation. This calculation has not been validated in all clinical situations. eGFR's persistently <60 mL/min signify possible Chronic Kidney Disease.    Anion gap 13 5 - 15  CBC WITH DIFFERENTIAL     Status: Abnormal   Collection Time: 02/20/2017 12:34 AM  Result Value Ref Range   WBC 17.9 (H) 4.0 - 10.5 K/uL   RBC 3.20 (L) 3.87 - 5.11 MIL/uL   Hemoglobin 8.1 (L) 12.0 - 15.0 g/dL   HCT 26.6 (L) 36.0 - 46.0 %   MCV 83.1 78.0 - 100.0 fL   MCH 25.3 (L) 26.0 - 34.0 pg   MCHC 30.5 30.0 - 36.0 g/dL   RDW 17.4 (H) 11.5 - 15.5 %   Platelets 208 150 - 400 K/uL   Neutrophils Relative % 93 %   Neutro Abs 16.6 (H) 1.7 - 7.7 K/uL   Lymphocytes Relative 4 %   Lymphs Abs 0.7 0.7 - 4.0 K/uL   Monocytes Relative 3 %   Monocytes Absolute 0.5 0.1 - 1.0 K/uL   Eosinophils Relative 0 %   Eosinophils Absolute 0.0 0.0 - 0.7 K/uL   Basophils Relative 0 %   Basophils Absolute 0.0 0.0 - 0.1 K/uL  Blood Culture (routine x 2)     Status: None (Preliminary result)   Collection Time: 02/02/2017 12:40 AM  Result Value Ref Range   Specimen Description BLOOD LEFT HAND    Special Requests      BOTTLES DRAWN AEROBIC AND ANAEROBIC Blood Culture adequate volume   Culture NO GROWTH 1 DAY    Report Status PENDING   I-Stat CG4 Lactic Acid, ED  (not at  [facility])     Status: None   Collection Time: 02/09/2017 12:41 AM  Result Value Ref Range   Lactic Acid, Venous 1.52 0.5 - 1.9 mmol/L  Blood Culture (routine x 2)     Status: None (Preliminary result)   Collection Time: 02/23/2017 12:48 AM  Result Value Ref Range   Specimen Description RIGHT ANTECUBITAL    Special Requests      BOTTLES DRAWN AEROBIC ONLY Blood Culture  results may not be optimal due to an inadequate volume of blood received in culture bottles   Culture NO GROWTH 1 DAY    Report Status PENDING   I-Stat CG4 Lactic Acid, ED  (not at  [facility])     Status: None   Collection Time: 02/16/2017  3:49 AM  Result Value Ref Range   Lactic Acid, Venous 1.15 0.5 - 1.9 mmol/L  MRSA PCR Screening     Status: None   Collection Time: 02/24/2017  7:00 AM  Result Value Ref Range   MRSA by PCR NEGATIVE NEGATIVE    Comment:        The GeneXpert MRSA Assay (FDA approved for NASAL specimens only), is one component of a comprehensive MRSA colonization surveillance program. It is not intended to diagnose MRSA infection nor to guide or monitor treatment for MRSA infections.   Hemoglobin A1c     Status: Abnormal   Collection Time: 01/28/2017  7:20 AM  Result Value Ref Range   Hgb A1c MFr Bld 5.8 (H) 4.8 - 5.6 %    Comment: (NOTE) Pre diabetes:          5.7%-6.4% Diabetes:              >6.4% Glycemic control for   <7.0% adults with diabetes    Mean Plasma Glucose 119.76 mg/dL    Comment: Performed at [facility] [address] [place], [place] [address]  Glucose, capillary     Status: Abnormal   Collection Time: 02/12/2017  8:01 AM  Result Value Ref Range   Glucose-Capillary 160 (H) 65 - 99 mg/dL  Glucose, capillary     Status: Abnormal   Collection Time: 02/07/2017 11:25 AM  Result Value Ref Range   Glucose-Capillary 120 (H) 65 - 99 mg/dL  Glucose, capillary     Status: Abnormal   Collection Time: 02/14/2017  5:03 PM  Result Value Ref Range   Glucose-Capillary 103 (H) 65 - 99 mg/dL  Glucose, capillary     Status: Abnormal   Collection Time: 02/21/2017 10:03 PM  Result Value Ref Range   Glucose-Capillary 102 (H) 65 - 99 mg/dL  Glucose, capillary     Status: None   Collection Time: 02/20/17  7:08 AM  Result Value Ref Range   Glucose-Capillary 97 65 - 99 mg/dL   Comment 1 Notify RN     Dg Chest Portable 1 View  Result Date: 02/12/2017 CLINICAL  DATA:  Altered mental status and difficulty breathing EXAM: PORTABLE CHEST 1 VIEW COMPARISON:  Chest radiographs 02/01/2017 FINDINGS: Unchanged appearance of left hilar mass. Developing opacities in the right lower lobe are unchanged. Heart remains enlarged. Mild pulmonary edema. Left subclavian approach Port-A-Cath is unchanged with tip in the lower SVC. IMPRESSION: Unchanged chest radiograph with left hilar mass and developing right basilar opacities. Mild pulmonary edema. Electronically Signed   By: Ulyses Jarred M.D.   On: 02/01/2017 03:27   Dg Chest Port 1 View  Result Date: 02/16/2017 CLINICAL DATA:  Altered mental status. Patient is warm to the touch. Labored breathing. Dialysis and cancer patient. EXAM: PORTABLE CHEST 1 VIEW COMPARISON:  02/13/2017 FINDINGS: Power port type central venous catheter with tip over the mid SVC region. No change in position since previous study. Cardiac enlargement. Left perihilar mass lesion is unchanged since previous study. Developing infiltration or atelectasis in the right lung base. This could indicate a developing pneumonia. No blunting of costophrenic angles. No pneumothorax. Surgical clips in the left axilla. Degenerative changes in the shoulders. IMPRESSION: Developing infiltration or atelectasis in the right lung base. Left hilar mass without change since prior study. Cardiac enlargement. Electronically Signed   By: Lucienne Capers M.D.   On: 02/08/2017 01:28  Review of Systems  Unable to perform ROS: Mental status change  Constitutional: Negative for fever.  Gastrointestinal: Negative for diarrhea.   Blood pressure (!) 114/52, pulse 76, temperature 98.8 F (37.1 C), temperature source Oral, resp. rate 20, height 5' 3"  (1.6 m), weight 71.9 kg (158 lb 8.2 oz), SpO2 96 %. Physical Exam  Constitutional: No distress.  Eyes: No scleral icterus.  Neck: JVD present.  Cardiovascular: Normal rate and regular rhythm.   Respiratory: No respiratory distress.  She has wheezes. She has rales.  GI: She exhibits no distension. There is no tenderness.  Musculoskeletal: She exhibits edema.  Neurological:  The patient is somnolent but arousable. Presently she doesn't answer questions.    Assessment/Plan: Problem #1 difficulty in breathing and hypoxemia. Presently patient is on BiPAP. Sheila O2 saturation is 100%. This could be a combination of COPD/lung mass/fluid overload.  Problem #2 end-stage renal disease: [name] status post hemodialysis on Saturday. Sheila potassium is normal. Problem #3 history of squamous cell cancer of Sheila lung. Patient is on chemotherapy. Problem #4 sepsis: Etiology at this moment not clear. Possibly from pneumonia however line infection cannot be ruled out. Patient presently is afebrile and Sheila white blood cell count is improving. Problem #5 anemia: Sheila hemoglobin is below our target goal. At this moment not sure whether patient is getting Epogen as an outpatient because of Sheila lung cancer. Problem #6 diabetes Problem #7 hypothyroidism Plan: 1] We'll make arrangements for patient to get dialysis today for 4 hours 2] We'll try to remove about 3 L if Sheila blood pressure tolerates 3] we'll check Sheila CBC and renal panel in the morning. 4] we'll hold Epogen until we get feedback from oncology about its use because of Sheila lung cancer.  Ainsleigh Kakos S 02/20/2017, 8:46 AM

## 2017-02-20 NOTE — Progress Notes (Signed)
Pt restless and crying. States she misses her children and wants [place]. Pt reassured and repositioned to get her more comfortable. Television turned on.

## 2017-02-20 NOTE — Consult Note (Signed)
Consultation Note Date: 02/20/2017   Patient Name: [name]  DOB: Jun 11, 1945  MRN: [id number]  Age / Sex: 71 y.o., female  PCP: [name], MD Referring Physician: Isaac Bliss, Olam Idler*  Reason for Consultation: Establishing goals of care and Psychosocial/spiritual support  HPI/Patient Profile: 71 y.o. female  with past medical history of squamous cell lung cancer stage IV on chemotherapy, in stage renal disease on hemodialysis Monday Wednesday Friday, diabetes, hypothyroidism, COPD admitted on 02/17/2017 with sepsis due to healthcare associated pneumonia with altered mental status.   Clinical Assessment and Goals of Care: [name] is resting quietly in bed. [name] able to open her eyes, making eye contact when I touch her arm. [name] not able to communicate effectively, due to BiPAP in place. [name] able to nod yes and no when asked questions.. She understands that she will have dialysis today, and that hopefully we can unburden her from BiPAP, and speak more tomorrow. No family at bedside at this time. Conference with nursing staff related to patient care, decision-makers.  Call to grandson, Loredana [name] at his cell phone number. He shares that he does help make decisions for his grandmother, but major decisions are made by [name] brother, [name]. We talk about [name] treatment plan including dialysis today, IV fluids were given for superinfection, and we need to take all fluid. Mateo Flow states that his understanding is that his grandmother has had dialysis for about 6 years now. We talk about white blood cells of 28 on admission, now reduced to 17. Mateo Flow states that he can meet tomorrow morning at bedside. I share that we may be able to meet on Wednesday, but that I will call him with updates as needed.  Healthcare power of attorney NEXT OF KIN - per nursing staff, grandson  [name] is primary decision maker. Call to [place] who shares that he does help make some decisions for his grandmother, but that he leans on his uncle, Crystalann settles brother, [name].   SUMMARY OF RECOMMENDATIONS   continue to treat the treatable. Code status discussions.  Code Status/Advance Care Planning:  Full code  Symptom Management:  per hospitalist, no additional needs at this time.   Palliative Prophylaxis:   Aspiration and Turn Reposition  Additional Recommendations (Limitations, Scope, Preferences):  Full Scope Treatment  Psycho-social/Spiritual:   Desire for further Chaplaincy support:no  Additional Recommendations: Caregiving  Support/Resources  Prognosis:   < 12 months, would not be surprising. Obviously much less if [name] decides at any point to stop dialysis.  Discharge Planning: To Be Determined      Primary Diagnoses: Present on Admission: . Sepsis ([place]) . Squamous cell carcinoma of lung, stage IV ([place]) . Sepsis with metabolic encephalopathy ([place]) . Hypothyroidism . ESRD (end stage renal disease) ([place]) . Community acquired pneumonia   I have reviewed the medical record, interviewed the patient and family, and examined the patient. The following aspects are pertinent.  Past Medical History:  Diagnosis Date  . Arthritis  knee  . Bronchitis   . Cancer ([place]) 2018   Lung Cancer  . COPD (chronic obstructive pulmonary disease) ([place])   . Diabetes mellitus   . Diarrhea    not constant- frequent  . ESRD on hemodialysis ([place])    [facility]. Started HD November 26, 2009. 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  [place] Kitchen Hyperlipidemia   . Hypertension   . Hypothyroidism   . Leg pain   . Peripheral vascular disease ([place])   . 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 Retroperitoneal bleeding 06/2016  . Stroke ([place])   . Thyroid disease    Social History   Social History  . Marital status:  Legally Separated    Spouse name: N/A  . Number of children: N/A  . Years of education: N/A   Social History Main Topics  . Smoking status: Former Smoker    Packs/day: 0.50    Years: 30.00    Types: Cigarettes    Quit date: 04/29/2012  . Smokeless tobacco: Never Used  . Alcohol use No  . Drug use: No  . Sexual activity: Not Asked   Other Topics Concern  . None   Social History Narrative  . None   Family History  Problem Relation Age of Onset  . Diabetes Mother    Scheduled Meds: . heparin  5,000 Units Subcutaneous Q8H  . insulin aspart  0-9 Units Subcutaneous TID WC  . insulin glargine  20 Units Subcutaneous QHS  . levothyroxine  75 mcg Oral QAC breakfast  . sodium chloride flush  3 mL Intravenous Q12H   Continuous Infusions: . sodium chloride    . sodium chloride    . sodium chloride    . piperacillin-tazobactam (ZOSYN)  IV Stopped (02/20/17 1300)  . vancomycin     PRN Meds:.sodium chloride, sodium chloride, sodium chloride, acetaminophen, alteplase, heparin, ibuprofen, lidocaine (PF), lidocaine-prilocaine, ondansetron **OR** ondansetron (ZOFRAN) IV, pentafluoroprop-tetrafluoroeth, sodium chloride flush Medications Prior to Admission:  Prior to Admission medications   Medication Sig Start Date End Date Taking? Authorizing Provider  acetaminophen (TYLENOL) 500 MG tablet Take 2,000 mg by mouth 3 (three) times daily as needed for moderate pain or headache.   Yes [provider]  amLODipine (NORVASC) 5 MG tablet Take 5 mg by mouth daily.   Yes [provider]  b complex-vitamin c-folic acid (NEPHRO-VITE) 0.8 MG TABS tablet Take 1 tablet by mouth daily.   Yes [provider]  calcium acetate (PHOSLO) 667 MG capsule Take 1 capsule (667 mg total) by mouth 2 (two) times daily with a meal. Patient taking differently: Take 1,334 mg by mouth 3 (three) times daily after meals.  06/04/12  Yes [name], Ripudeep K, MD  dexamethasone (DECADRON) 4 MG tablet Take 2  tablets (8 mg total) by mouth daily. Start the day after chemotherapy for 2 days. 02/01/17  Yes [name], MD  diclofenac sodium (VOLTAREN) 1 % GEL Apply 2 g topically daily as needed (for pain).  11/24/16  Yes [provider]  insulin glargine (LANTUS) 100 UNIT/ML injection Inject 0.1 mLs (10 Units total) into the skin at bedtime. Patient taking differently: Inject 20 Units into the skin at bedtime.  07/12/16  Yes Eugenie Filler, MD  insulin lispro (HUMALOG) 100 UNIT/ML injection Inject 5 Units into the skin daily after breakfast.    Yes [provider]  levothyroxine (SYNTHROID, LEVOTHROID) 75 MCG tablet Take 75 mcg by mouth  daily.     Yes [provider]  lidocaine-prilocaine (EMLA) cream Apply to affected area once Patient taking differently: Apply 1 application topically as directed. Apply to affected area once 02/01/17  Yes [name], MD  linagliptin (TRADJENTA) 5 MG TABS tablet Take 5 mg by mouth daily.   Yes [provider]  metoCLOPramide (REGLAN) 5 MG tablet Take 1-2 tablets (5-10 mg total) by mouth every 8 (eight) hours as needed (if ondansetron (ZOFRAN) ineffective.). 06/01/12  Yes [name], Ripudeep K, MD  Multiple Vitamins-Minerals (RENAL) TABS Take 1 tablet by mouth daily.   Yes [provider]  ondansetron (ZOFRAN) 8 MG tablet Take 1 tablet (8 mg total) by mouth every 8 (eight) hours as needed for nausea or vomiting. Patient taking differently: Take 4 mg by mouth every 6 (six) hours as needed for nausea or vomiting.  02/07/17  Yes [name], MD  prochlorperazine (COMPAZINE) 10 MG tablet Take 1 tablet (10 mg total) by mouth every 6 (six) hours as needed (Nausea or vomiting). 02/01/17  Yes [name], MD  CARBOPLATIN IV Inject into the vein. Every 3 weeks    [provider]  PACLitaxel (TAXOL IV) Inject into the vein. Every 3 weeks    [provider]  Pegfilgrastim (NEULASTA ONPRO [place]) Inject into the skin. Every 3 weeks     [provider]   Allergies  Allergen Reactions  . Contrast Media [Iodinated Diagnostic Agents] Itching  . Latex Itching   Review of Systems  Unable to perform ROS: Acuity of condition    Physical Exam  Constitutional: No distress.  Appears frail, acutely/chronically ill. Briefly makes but does not keep eye contact.  HENT:  Head: Atraumatic.  Cardiovascular: Normal rate and regular rhythm.   Pulmonary/Chest:  BiPAP in place, rate normal  Abdominal: Soft. She exhibits no distension.  Neurological: [name] alert.  BiPAP in place, unable to determine orientation  Skin: Skin is warm and dry.  Nursing note and vitals reviewed.   Vital Signs: BP (!) 103/42   Pulse 75   Temp 100 F (37.8 C) (Oral)   Resp 18   Ht 5\' 3"  (1.6 m)   Wt 71.9 kg (158 lb 8.2 oz)   SpO2 100%   BMI 28.08 kg/m  Pain Assessment: PAINAD   Pain Score: 6    SpO2: SpO2: 100 % O2 Device:SpO2: 100 % O2 Flow Rate: .O2 Flow Rate (L/min): 3 L/min  IO: Intake/output summary:  Intake/Output Summary (Last 24 hours) at 02/20/17 1503 Last data filed at 01/29/2017 2206  Gross per 24 hour  Intake              103 ml  Output                0 ml  Net              103 ml    LBM: Last BM Date: 02/18/17 Baseline Weight: Weight: 71.9 kg (158 lb 8.2 oz) Most recent weight: Weight: 71.9 kg (158 lb 8.2 oz)     Palliative Assessment/Data:   Flowsheet Rows     Most Recent Value  Intake Tab  Referral Department  Hospitalist  Unit at Time of Referral  ICU  Palliative Care Primary Diagnosis  Sepsis/Infectious Disease  Date Notified  02/20/17  Palliative Care Type  New Palliative care  Reason for referral  Clarify Goals of Care  Date of Admission  02/05/2017  Date first seen by Palliative Care  02/20/17  # of days Palliative referral response time  0 Day(s)  # of days IP prior to Palliative referral  1  Clinical Assessment  Palliative Performance Scale Score  30%  Pain Max last 24 hours  Not able to  report  Pain Min Last 24 hours  Not able to report  Dyspnea Max Last 24 Hours  Not able to report  Dyspnea Min Last 24 hours  Not able to report  Psychosocial & Spiritual Assessment  Palliative Care Outcomes  Patient/Family meeting held?  Yes  Who was at the meeting?  grandson via phone.   Palliative Care Outcomes  Provided advance care planning, Provided psychosocial or spiritual support, Clarified goals of care      Time In:     0930    1410 Time Out:  0165    5374 Time Total: 20+20 = 40 minutes Greater than 50%  of this time was spent counseling and coordinating care related to the above assessment and plan.  Signed by: Drue Novel, NP   Please contact Palliative Medicine Team phone at [phone] for questions and concerns.  For individual provider: See Shea Evans

## 2017-02-21 DIAGNOSIS — E033 Postinfectious hypothyroidism: Secondary | ICD-10-CM

## 2017-02-21 LAB — CBC
HCT: 25.8 % — ABNORMAL LOW (ref 36.0–46.0)
Hemoglobin: 7.8 g/dL — ABNORMAL LOW (ref 12.0–15.0)
MCH: 25.2 pg — ABNORMAL LOW (ref 26.0–34.0)
MCHC: 30.2 g/dL (ref 30.0–36.0)
MCV: 83.5 fL (ref 78.0–100.0)
RBC: 3.09 MIL/uL — ABNORMAL LOW (ref 3.87–5.11)
RDW: 17.4 % — ABNORMAL HIGH (ref 11.5–15.5)
WBC: 2.1 K/uL — ABNORMAL LOW (ref 4.0–10.5)

## 2017-02-21 LAB — RENAL FUNCTION PANEL
Albumin: 1.9 g/dL — ABNORMAL LOW (ref 3.5–5.0)
Anion gap: 12 (ref 5–15)
BUN: 26 mg/dL — AB (ref 6–20)
CHLORIDE: 99 mmol/L — AB (ref 101–111)
CO2: 29 mmol/L (ref 22–32)
Calcium: 7.4 mg/dL — ABNORMAL LOW (ref 8.9–10.3)
Creatinine, Ser: 3.24 mg/dL — ABNORMAL HIGH (ref 0.44–1.00)
GFR calc Af Amer: 15 mL/min — ABNORMAL LOW (ref >60)
GFR calc non Af Amer: 13 mL/min — ABNORMAL LOW (ref >60)
GLUCOSE: 154 mg/dL — AB (ref 65–99)
POTASSIUM: 4 mmol/L (ref 3.5–5.1)
Phosphorus: 3.4 mg/dL (ref 2.5–4.6)
Sodium: 140 mmol/L (ref 135–145)

## 2017-02-21 LAB — GLUCOSE, CAPILLARY
GLUCOSE-CAPILLARY: 120 mg/dL — AB (ref 65–99)
GLUCOSE-CAPILLARY: 90 mg/dL (ref 65–99)
Glucose-Capillary: 105 mg/dL — ABNORMAL HIGH (ref 65–99)
Glucose-Capillary: 64 mg/dL — ABNORMAL LOW (ref 65–99)
Glucose-Capillary: 67 mg/dL (ref 65–99)

## 2017-02-21 LAB — HEPATITIS B SURFACE ANTIGEN: Hepatitis B Surface Ag: NEGATIVE

## 2017-02-21 MED ORDER — INSULIN GLARGINE 100 UNIT/ML ~~LOC~~ SOLN
10.0000 [IU] | Freq: Every day | SUBCUTANEOUS | Status: DC
  Administered 2017-02-22 – 2017-02-23 (×2): 10 [IU] via SUBCUTANEOUS
  Filled 2017-02-21 (×3): qty 0.1

## 2017-02-21 MED ORDER — SODIUM CHLORIDE 0.9 % IV BOLUS (SEPSIS)
250.0000 mL | Freq: Once | INTRAVENOUS | Status: AC
  Administered 2017-02-21: 250 mL via INTRAVENOUS

## 2017-02-21 MED ORDER — OXYCODONE-ACETAMINOPHEN 5-325 MG PO TABS: 1.0000 | ORAL_TABLET | Freq: Four times a day (QID) | ORAL | Status: DC | PRN

## 2017-02-21 MED ORDER — MORPHINE SULFATE (PF) 4 MG/ML IV SOLN
4.0000 mg | Freq: Once | INTRAVENOUS | Status: AC
  Administered 2017-02-21: 4 mg via INTRAVENOUS
  Filled 2017-02-21: qty 1

## 2017-02-21 MED ORDER — OXYCODONE-ACETAMINOPHEN 5-325 MG PO TABS
1.0000 | ORAL_TABLET | Freq: Four times a day (QID) | ORAL | Status: DC | PRN
  Administered 2017-02-21 (×2): 1 via ORAL
  Administered 2017-02-22 – 2017-02-24 (×6): 2 via ORAL
  Filled 2017-02-21 (×3): qty 2
  Filled 2017-02-21: qty 1
  Filled 2017-02-21 (×2): qty 2
  Filled 2017-02-21: qty 1
  Filled 2017-02-21: qty 2

## 2017-02-21 MED ORDER — DEXTROSE 50 % IV SOLN
1.0000 | Freq: Once | INTRAVENOUS | Status: AC
  Administered 2017-02-21: 50 mL via INTRAVENOUS
  Filled 2017-02-21: qty 50

## 2017-02-21 NOTE — Progress Notes (Addendum)
PROGRESS NOTE    [name]  QMG:[id number] DOB: [birth year] DOA: 02/06/2017 PCP: [name], MD     Brief Narrative:  71 year old woman admitted from home on 9/23 due to altered mental status and fever. She has multiple medical comorbidities including end-stage renal disease on hemodialysis, squamous cell cancer of the lung currently undergoing chemotherapy, COPD, left issues. She received fluid boluses due to sepsis protocol and admission and developed pulmonary edema and has been placed on BiPAP overnight on 9/23. She received dialysis . on 9/24 and was able to come off BiPAP She was found to have  pneumonia and was started on antibiotics. family is to meet with palliative care today.   Assessment & Plan:   Active Problems:   Sepsis with metabolic encephalopathy (HCC)   ESRD (end stage renal disease) ([place])   Hypothyroidism   Community acquired pneumonia   Squamous cell carcinoma of lung, stage IV (HCC)   Sepsis ([place])   Goals of care, counseling/discussion   Palliative care encounter   Sepsis with acute metabolic encephalopathy -Presumed due to hospital-acquired pneumonia. -Encephalopathy has improved overnight. Per daughter at bedside she was able to eat today without assistance and has been recognizing family members. When I went to see her she was drowsy, daughter states this is because she had received pain medication about 15-20 minutes prior. -Also developed acute hypoxemic respiratory failure, I believe in response to fluids that she received in ED causing pulmonary edema due to sepsis protocol. She had dialysis on 9/24 and was able to come off BiPAP.  -Continue Zosyn. Due to negative MRSA PCR vancomycin will be discontinued. -Leukocytosis is improving. If blood cultures positive will need to consider line infection. -Is  on dialysis for end-stage renal disease. Appreciate nephrology input and recommendations. -Given her active lung cancer and severe acute illness I  believe it is appropriate to involve palliative care to further determine goals of care. Family meeting scheduled for 9/25.  Hypothyroidism -Continue Synthroid  Diabetes -With multiple hypoglycemic episodes. -Will decrease Lantus from 20 units to 10 units to start tonight.  Anemia of chronic disease -Due to end-stage renal disease. -Hemoglobin is around 7.8. -No transfusions planned unless less than 7.  Lung cancer -To continue outpatient follow-up with oncology if after palliative care meeting family decides to pursue aggressive management.     DVT prophylaxis: Subcutaneous heparin Code Status: Full code Family Communication: Daughter at bedside updated on plan of care and questions answered Disposition Plan: Pending medical improvement  Consultants:   Nephrology  Procedures:   None  Antimicrobials:  Anti-infectives    Start     Dose/Rate Route Frequency Ordered Stop   02/20/17 1600  vancomycin (VANCOCIN) IVPB 750 mg/150 ml premix     750 mg 150 mL/hr over 60 Minutes Intravenous Every M-W-F (Hemodialysis) 02/21/2017 1031     02/07/2017 1200  piperacillin-tazobactam (ZOSYN) IVPB 3.375 g     3.375 g 12.5 mL/hr over 240 Minutes Intravenous Every 12 hours 02/18/2017 0757     02/07/2017 0100  piperacillin-tazobactam (ZOSYN) IVPB 3.375 g     3.375 g 100 mL/hr over 30 Minutes Intravenous  Once 02/12/2017 0054 02/02/2017 0127   02/02/2017 0100  vancomycin (VANCOCIN) IVPB 1000 mg/200 mL premix     1,000 mg 200 mL/hr over 60 Minutes Intravenous  Once 02/12/2017 0054 02/17/2017 0304       Subjective: Lying in bed, drowsy, opens eyes to voice but falls quickly back to sleep.  Objective: Vitals:  02/21/17 1300 02/21/17 1330 02/21/17 1400 02/21/17 1430  BP: (!) 94/42 (!) 104/41 (!) 115/42 (!) 112/48  Pulse: 74 75 78 77  Resp: (!) 32 (!) 34 (!) 27 (!) 25  Temp:      TempSrc:      SpO2: 93% 93% (!) 89% 93%  Weight:      Height:        Intake/Output Summary (Last 24 hours) at  02/21/17 1500 Last data filed at 02/20/17 2030  Gross per 24 hour  Intake                0 ml  Output             2500 ml  Net            -2500 ml   Filed Weights   02/18/2017 0652 02/20/17 1615  Weight: 71.9 kg (158 lb 8.2 oz) 71.9 kg (158 lb 8.2 oz)    Examination:  General exam: Drowsy, opens eyes to voice Respiratory system: Mild bilateral crackles Cardiovascular system:RRR. No murmurs, rubs, gallops. Gastrointestinal system: Abdomen is nondistended, soft and nontender. No organomegaly or masses felt. Normal bowel sounds heard. Central nervous system: Unable to assess given current mental state Extremities: Status post left BKA, right with 1+ pedal edema Skin: No rashes, lesions or ulcers Psychiatry: Unable to fully assess given current mental state     Data Reviewed: I have personally reviewed following labs and imaging studies  CBC:  Recent Labs Lab 02/15/17 0901 02/17/17 1945 02/08/2017 0034 02/20/17 1642 02/21/17 0425  WBC 10.3 28.8* 17.9* 3.7* 2.1*  NEUTROABS 8.6* 28.3* 16.6*  --   --   HGB 8.6* 7.9* 8.1* 7.2* 7.8*  HCT 29.0* 25.9* 26.6* 23.7* 25.8*  MCV 84.3 83.0 83.1 82.0 83.5  PLT 261 249 208 113* SPECIMEN CHECKED FOR CLOTS   Basic Metabolic Panel:  Recent Labs Lab 02/15/17 0901 02/17/17 1945 02/10/2017 0034 02/20/17 1642 02/21/17 0425  NA 134* 135 139 141 140  K 3.3* 5.0 4.5 4.4 4.0  CL 94* 97* 100* 102 99*  CO2 27 23 26 24 29   GLUCOSE 67 308* 209* 76 154*  BUN 26* 45* 28* 50* 26*  CREATININE 5.31* 6.67* 4.15* 6.31* 3.24*  CALCIUM 7.8* 7.4* 7.6* 7.2* 7.4*  PHOS  --   --   --  6.2* 3.4   GFR: Estimated Creatinine Clearance: 15.1 mL/min (A) (by C-G formula based on SCr of 3.24 mg/dL (H)). Liver Function Tests:  Recent Labs Lab 02/15/17 0901 02/01/2017 0034 02/20/17 1642 02/21/17 0425  AST 98* 139*  --   --   ALT 11* 12*  --   --   ALKPHOS 215* 181*  --   --   BILITOT 0.7 0.8  --   --   PROT 6.9 7.0  --   --   ALBUMIN 2.3* 2.3* 1.9* 1.9*    No results for input(s): LIPASE, AMYLASE in the last 168 hours. No results for input(s): AMMONIA in the last 168 hours. Coagulation Profile: No results for input(s): INR, PROTIME in the last 168 hours. Cardiac Enzymes: No results for input(s): CKTOTAL, CKMB, CKMBINDEX, TROPONINI in the last 168 hours. BNP (last 3 results) No results for input(s): PROBNP in the last 8760 hours. HbA1C:  Recent Labs  02/14/2017 0720  HGBA1C 5.8*   CBG:  Recent Labs Lab 02/20/17 1635 02/20/17 2205 02/21/17 0737 02/21/17 1142 02/21/17 1209  GLUCAP 74 146* 105* 64* 67   Lipid Profile: No  results for input(s): CHOL, HDL, LDLCALC, TRIG, CHOLHDL, LDLDIRECT in the last 72 hours. Thyroid Function Tests: No results for input(s): TSH, T4TOTAL, FREET4, T3FREE, THYROIDAB in the last 72 hours. Anemia Panel: No results for input(s): VITAMINB12, FOLATE, FERRITIN, TIBC, IRON, RETICCTPCT in the last 72 hours. Urine analysis:    Component Value Date/Time   COLORURINE YELLOW 02/26/2012 [place] 02/26/2012 2345   LABSPEC 1.015 02/26/2012 2345   PHURINE 7.0 02/26/2012 2345   GLUCOSEU 250 (A) 02/26/2012 2345   HGBUR SMALL (A) 02/26/2012 2345   BILIRUBINUR NEGATIVE 02/26/2012 2345   KETONESUR NEGATIVE 02/26/2012 2345   PROTEINUR 100 (A) 02/26/2012 2345   UROBILINOGEN 0.2 02/26/2012 2345   NITRITE NEGATIVE 02/26/2012 2345   LEUKOCYTESUR SMALL (A) 02/26/2012 2345   Sepsis Labs: @LABRCNTIP (procalcitonin:4,lacticidven:4)  ) Recent Results (from the past 240 hour(s))  Blood Culture (routine x 2)     Status: None (Preliminary result)   Collection Time: 02/18/2017 12:40 AM  Result Value Ref Range Status   Specimen Description BLOOD LEFT HAND  Final   Special Requests   Final    BOTTLES DRAWN AEROBIC AND ANAEROBIC Blood Culture adequate volume   Culture NO GROWTH 2 DAYS  Final   Report Status PENDING  Incomplete  Blood Culture (routine x 2)     Status: None (Preliminary result)    Collection Time: 02/18/2017 12:48 AM  Result Value Ref Range Status   Specimen Description RIGHT ANTECUBITAL  Final   Special Requests   Final    BOTTLES DRAWN AEROBIC ONLY Blood Culture results may not be optimal due to an inadequate volume of blood received in culture bottles   Culture NO GROWTH 2 DAYS  Final   Report Status PENDING  Incomplete  MRSA PCR Screening     Status: None   Collection Time: 02/01/2017  7:00 AM  Result Value Ref Range Status   MRSA by PCR NEGATIVE NEGATIVE Final    Comment:        The GeneXpert MRSA Assay (FDA approved for NASAL specimens only), is one component of a comprehensive MRSA colonization surveillance program. It is not intended to diagnose MRSA infection nor to guide or monitor treatment for MRSA infections.          Radiology Studies: No results found.      Scheduled Meds: . heparin  5,000 Units Subcutaneous Q8H  . insulin aspart  0-9 Units Subcutaneous TID WC  . insulin glargine  20 Units Subcutaneous QHS  . levothyroxine  75 mcg Oral QAC breakfast  . sodium chloride flush  3 mL Intravenous Q12H   Continuous Infusions: . sodium chloride    . sodium chloride    . sodium chloride    . piperacillin-tazobactam (ZOSYN)  IV Stopped (02/21/17 1349)  . vancomycin Stopped (02/20/17 2032)     LOS: 2 days    Time spent: 35 minutes. Greater than 50% of this time was spent in direct contact with the patient coordinating care.     Lelon Frohlich, MD [facility] Pager [phone]  If 7PM-7AM, please contact night-coverage [email or web address] Password [email or web address] 02/21/2017, 3:00 PM

## 2017-02-21 NOTE — Progress Notes (Signed)
Daily Progress Note   Patient Name: [name]       Date: 02/21/2017 DOB: 24-Feb-1946  Age: 71 y.o. MRN#: [id number] Attending Physician: Isaac Bliss, [place] Primary Care Physician: [name], MD Admit Date: 02/24/2017  Reason for Consultation/Follow-up: Establishing goals of care and Psychosocial/spiritual support  Subjective: Caregiver, [name], IS NOT biological child. [name] NOT a Media planner.  [name] is resting quietly in bed. She does not open her eyes or respond in any meaningful way to a hard sternal rub. Nursing staff at bedside. Vital signs stable. No family of bedside at this time.  Call to grandson [name] after initial message left with brother [name]. He states that he can make a family meeting tomorrow at 61 AM. We talk briefly about code status.  Call to brother, [name], left generic voicemail message. Called again after speaking with grandson [name] to advise of family meeting 9/26 at 11 AM at bed side, Mr. [name] states that he can attend this family meeting. Mr. [name] states that [name]'s husband is deceased, she has one son, but he is "a drug addict". Mr. [name] states that [name] has live-in caregivers including [name], and grandson [name].  I share my concerns over [name] current health state. We talk about the realities of CPR and intubation. I discussed the concept of treat the treatable but no extraordinary measures. Mr. [name] states that his sister has discussed this with him, but he is reluctant to make any changes at this time. I encourage him to think about what's important to his sister, what gives her pleasure, and to consider what her life would be like if she were to survive CPR. I share that this would not change her  dialysis or cancer. He agrees.  Length of Stay: 2  Current Medications: Scheduled Meds:  . heparin  5,000 Units Subcutaneous Q8H  . insulin aspart  0-9 Units Subcutaneous TID WC  . insulin glargine  10 Units Subcutaneous QHS  . levothyroxine  75 mcg Oral QAC breakfast  . sodium chloride flush  3 mL Intravenous Q12H    Continuous Infusions: . sodium chloride    . sodium chloride    . sodium chloride    . piperacillin-tazobactam (ZOSYN)  IV Stopped (02/21/17 1349)    PRN Meds: sodium  chloride, sodium chloride, sodium chloride, acetaminophen, alteplase, heparin, ibuprofen, lidocaine (PF), lidocaine-prilocaine, midodrine, ondansetron **OR** ondansetron (ZOFRAN) IV, oxyCODONE-acetaminophen, pentafluoroprop-tetrafluoroeth, sodium chloride flush  Physical Exam  Constitutional: No distress.  does not open eyes to sternal rub  HENT:  Head: Atraumatic.  Cardiovascular: Normal rate.   No murmur heard. Pulmonary/Chest: Effort normal. No respiratory distress.  Abdominal: Soft. She exhibits no distension.  Musculoskeletal: She exhibits no edema.  Neurological:  Does not open eyes to sternal rub  Skin: Skin is warm and dry.  Nursing note and vitals reviewed.           Vital Signs: BP (!) 112/48   Pulse 77   Temp 98.8 F (37.1 C) (Oral)   Resp (!) 25   Ht 5\' 3"  (1.6 m)   Wt 71.9 kg (158 lb 8.2 oz)   SpO2 93%   BMI 28.08 kg/m  SpO2: SpO2: 93 % O2 Device: O2 Device: Nasal Cannula O2 [name] Rate: O2 [name] Rate (L/min): 2 L/min  Intake/output summary:  Intake/Output Summary (Last 24 hours) at 02/21/17 1522 Last data filed at 02/20/17 2030  Gross per 24 hour  Intake                0 ml  Output             2500 ml  Net            -2500 ml   LBM: Last BM Date: 02/18/17 Baseline Weight: Weight: 71.9 kg (158 lb 8.2 oz) Most recent weight: Weight: 71.9 kg (158 lb 8.2 oz)       Palliative Assessment/Data:    Flowsheet Rows     Most Recent Value  Intake Tab  Referral  Department  Hospitalist  Unit at Time of Referral  ICU  Palliative Care Primary Diagnosis  Sepsis/Infectious Disease  Date Notified  02/20/17  Palliative Care Type  New Palliative care  Reason for referral  Clarify Goals of Care  Date of Admission  02/21/2017  Date first seen by Palliative Care  02/20/17  # of days Palliative referral response time  0 Day(s)  # of days IP prior to Palliative referral  1  Clinical Assessment  Palliative Performance Scale Score  30%  Pain Max last 24 hours  Not able to report  Pain Min Last 24 hours  Not able to report  Dyspnea Max Last 24 Hours  Not able to report  Dyspnea Min Last 24 hours  Not able to report  Psychosocial & Spiritual Assessment  Palliative Care Outcomes  Patient/Family meeting held?  Yes  Who was at the meeting?  grandson via phone.   Palliative Care Outcomes  Provided advance care planning, Provided psychosocial or spiritual support, Clarified goals of care      Patient Active Problem List   Diagnosis Date Noted  . Goals of care, counseling/discussion   . Palliative care encounter   . Sepsis ([place]) 01/28/2017  . Squamous carcinoma of lung, left ([place])   . Squamous cell carcinoma of lung, stage IV ([place]) 02/01/2017  . Lung mass 07/10/2016  . Community acquired pneumonia   . Renal cyst, right   . Renal hemorrhage, right   . Renal mass, right   . Acute blood loss anemia 07/07/2016  . Community acquired pneumonia of left lung   . Hypothyroidism   . Retroperitoneal bleeding 07/06/2016  . Retroperitoneal bleed 07/06/2016  . End stage renal disease ([place]) 11/21/2012  . Pre-operative cardiovascular examination 11/21/2012  . Ulcer  of lower limb, unspecified 07/11/2012  . Atherosclerosis of native arteries of the extremities with ulceration(440.23) 07/11/2012  . Aspiration pneumonia ([place]) 05/31/2012  . ESRD (end stage renal disease) ([place]) 05/30/2012  . Acute respiratory failure ([place]) 05/24/2012  . Severe sepsis ([place]) 05/24/2012  .  Sepsis with metabolic encephalopathy ([place]) 05/24/2012  . Clostridium difficile colitis 05/24/2012  . Septic shock(785.52) 05/24/2012  . Fever 05/22/2012  . Femur fracture, left ([place]) 05/19/2012  . Fall at home 05/19/2012  . RENAL DISEASE, CHRONIC, STAGE V 07/29/2008  . ANEMIA OF RENAL FAILURE 04/14/2008  . UPPER RESPIRATORY INFECTION, VIRAL 04/02/2008  . ABDOMINAL PAIN 04/02/2008  . HYPERKERATOSIS 11/12/2007  . UNSPECIFIED DEBILITY 11/12/2007  . COUGH 10/03/2007  . RHINITIS 09/14/2007  . BACK PAIN, LUMBAR 09/14/2007  . CELLULITIS AND ABSCESS OF LEG EXCEPT FOOT 06/18/2007  . Diabetes mellitus type II, uncontrolled ([place]) 05/30/2007  . SYNCOPE 04/09/2007  . LEG CRAMPS 04/02/2007  . ELECTROCARDIOGRAM, ABNORMAL 04/02/2007  . RENAL INSUFFICIENCY, ACUTE 02/16/2007  . EDEMA LEG 10/20/2006  . BRONCHITIS, ACUTE WITH BRONCHOSPASM 09/22/2006  . ROTATOR CUFF INJURY, LEFT SHOULDER 08/08/2006  . FIBROCYSTIC BREAST DISEASE 07/28/2006  . SHOULDER PAIN, LEFT 07/28/2006  . GOITER, MULTINODULAR 05/03/2006  . HYPERLIPIDEMIA 05/03/2006  . Iron deficiency anemia 05/03/2006  . SYNDROME, RESTLESS LEGS 05/03/2006  . Hypertension 05/03/2006  . Peripheral vascular disease ([place]) 05/03/2006  . CONSTIPATION 05/03/2006  . ARTHRITIS 05/03/2006  . PROTEINURIA 05/03/2006  . DIVERTICULITIS, HX OF 05/03/2006    Palliative Care Assessment & Plan   Patient Profile: 71 y.o. female  with past medical history of squamous cell lung cancer stage IV on chemotherapy, in stage renal disease on hemodialysis Monday Wednesday Friday, diabetes, hypothyroidism, COPD admitted on 02/22/2017 with sepsis due to healthcare associated pneumonia with altered mental status.   Assessment: sepsis due to healthcare associated pneumonia with altered mental status:  Respiratory failure requiring the use of BiPAP. Able to come off BiPAP 9/24 after dialysis.Continues IV Zosyn. Checking blood cultures  Recommendations/Plan:  At this  point, continues with full scope treatment.  Continue code status discussions.  Goals of Care and Additional Recommendations:  Limitations on Scope of Treatment: Full Scope Treatment  Code Status:    Code Status Orders        Start     Ordered   02/11/2017 0651  Full code  Continuous     02/04/2017 0650    Code Status History    Date Active Date Inactive Code Status Order ID Comments User Context   07/06/2016 10:27 AM 07/12/2016  9:25 PM Full Code [id number]  Eber Jones, MD Inpatient   07/25/2012 12:06 PM 07/27/2012  9:55 PM Full Code [id number]  Newt Minion, MD Inpatient   05/19/2012 11:30 PM 06/04/2012  8:15 PM Full Code [id number]  Derrill Kay, MD Inpatient       Prognosis:   Unable to determine, no improvement in mental state today. 6 to 12 months would not be surprising. Obviously much less if Mrs. Cabello decides at any point to stop dialysis  Discharge Planning:  To Be Determined  Care plan was discussed with nursing staff, case manager, social worker, and Dr. Jerilee Hoh.   Thank you for allowing the Palliative Medicine Team to assist in the care of this patient.   Time In: 1430 Time Out: 1515 Total Time 45 minutes Prolonged Time Billed  no       Greater than 50%  of this time was spent counseling and coordinating care  related to the above assessment and plan.  Drue Novel, NP  Please contact Palliative Medicine Team phone at [phone] for questions and concerns.

## 2017-02-21 NOTE — Progress Notes (Addendum)
Subjective: Interval History: none. Patient remains somnolent and difficult to get information.  Objective: Vital signs in last 24 hours: Temp:  [98 F (36.7 C)-100.2 F (37.9 C)] 99.8 F (37.7 C) (09/25 0442) Pulse Rate:  [25-87] 78 (09/25 0500) Resp:  [16-28] 26 (09/25 0500) BP: (70-127)/(34-58) 87/40 (09/25 0500) SpO2:  [91 %-100 %] 94 % (09/25 0500) Weight:  [71.9 kg (158 lb 8.2 oz)] 71.9 kg (158 lb 8.2 oz) (09/24 1615) Weight change:   Intake/Output from previous day: 09/24 0701 - 09/25 0700 In: -  Out: 2500  Intake/Output this shift: No intake/output data recorded.  Generally patient is still very sleepy, arousable. She she shakes her head To say yes and no. Presently she is on no other oxygen and seems to be comfortable. Chest: Decreased breath sound otherwise seems to be clear Heart exam revealed regular rate and rhythm Extremities no edema. She has left leg amputation  Lab Results:  Recent Labs  02/20/17 1642 02/21/17 0425  WBC 3.7* 2.1*  HGB 7.2* 7.8*  HCT 23.7* 25.8*  PLT 113* SPECIMEN CHECKED FOR CLOTS   BMET:  Recent Labs  02/20/17 1642 02/21/17 0425  NA 141 140  K 4.4 4.0  CL 102 99*  CO2 24 29  GLUCOSE 76 154*  BUN 50* 26*  CREATININE 6.31* 3.24*  CALCIUM 7.2* 7.4*   No results for input(s): PTH in the last 72 hours. Iron Studies: No results for input(s): IRON, TIBC, TRANSFERRIN, FERRITIN in the last 72 hours.  Studies/Results: No results found.  I have reviewed the patient's current medications.  Assessment/Plan: Problem #1 difficulty breathing: Possibly multifactorial. Today she seems to be comfortable. We're able to remove about 2500 with dialysis yesterday. Problem #2 end-stage renal disease: Her potassium is normal Problem #3 anemia: Her hemoglobin is below target goal. Seems to be improving. Patient is not receiving Epogen until cleared by oncology. Problem #4 Squamous cell cancer for lung: [name] on chemotherapy Problem #5 bone  and mineral disorder: Her calcium  is range. Problem #6 sever malnutrition. Her albumin is 1.9.  Problem #7 pneumonia: Patient is on antibiotics. [name] a febrile and her white blood cell count is low. Blood culture is pending. Plan: We'll make arrangements for patient to get dialysis tomorrow We'll check her CBC and renal panel in the morning. We'll continue his present management   LOS: 2 days   Anuradha Chabot S 02/21/2017,8:22 AM

## 2017-02-22 ENCOUNTER — Other Ambulatory Visit (HOSPITAL_COMMUNITY): Payer: Medicare Other

## 2017-02-22 ENCOUNTER — Ambulatory Visit (HOSPITAL_COMMUNITY): Payer: Medicare Other

## 2017-02-22 LAB — CBC
HCT: 25.2 % — ABNORMAL LOW (ref 36.0–46.0)
Hemoglobin: 7.3 g/dL — ABNORMAL LOW (ref 12.0–15.0)
MCH: 24.6 pg — ABNORMAL LOW (ref 26.0–34.0)
MCHC: 29 g/dL — ABNORMAL LOW (ref 30.0–36.0)
MCV: 84.8 fL (ref 78.0–100.0)
PLATELETS: 88 10*3/uL — AB (ref 150–400)
RBC: 2.97 MIL/uL — AB (ref 3.87–5.11)
RDW: 16.9 % — AB (ref 11.5–15.5)
WBC: 1.1 10*3/uL — AB (ref 4.0–10.5)

## 2017-02-22 LAB — BASIC METABOLIC PANEL
ANION GAP: 14 (ref 5–15)
BUN: 40 mg/dL — ABNORMAL HIGH (ref 6–20)
CALCIUM: 7.5 mg/dL — AB (ref 8.9–10.3)
CO2: 26 mmol/L (ref 22–32)
Chloride: 100 mmol/L — ABNORMAL LOW (ref 101–111)
Creatinine, Ser: 4.91 mg/dL — ABNORMAL HIGH (ref 0.44–1.00)
GFR calc non Af Amer: 8 mL/min — ABNORMAL LOW (ref >60)
GFR, EST AFRICAN AMERICAN: 9 mL/min — AB (ref >60)
Glucose, Bld: 95 mg/dL (ref 65–99)
Potassium: 4.1 mmol/L (ref 3.5–5.1)
SODIUM: 140 mmol/L (ref 135–145)

## 2017-02-22 LAB — DIFFERENTIAL
BASOS PCT: 0 %
Band Neutrophils: 0 %
Basophils Absolute: 0 10*3/uL (ref 0.0–0.1)
Blasts: 0 %
EOS ABS: 0 10*3/uL (ref 0.0–0.7)
Eosinophils Relative: 2 %
LYMPHS ABS: 0.5 10*3/uL — AB (ref 0.7–4.0)
LYMPHS PCT: 55 %
MONO ABS: 0.1 10*3/uL (ref 0.1–1.0)
MYELOCYTES: 0 %
Metamyelocytes Relative: 0 %
Monocytes Relative: 13 %
NEUTROS ABS: 0.3 10*3/uL — AB (ref 1.7–7.7)
Neutrophils Relative %: 30 %
OTHER: 0 %
Promyelocytes Absolute: 0 %
nRBC: 0 /100 WBC

## 2017-02-22 LAB — GLUCOSE, CAPILLARY
GLUCOSE-CAPILLARY: 172 mg/dL — AB (ref 65–99)
Glucose-Capillary: 100 mg/dL — ABNORMAL HIGH (ref 65–99)
Glucose-Capillary: 109 mg/dL — ABNORMAL HIGH (ref 65–99)

## 2017-02-22 MED ORDER — MORPHINE SULFATE (PF) 2 MG/ML IV SOLN
2.0000 mg | INTRAVENOUS | Status: DC | PRN
  Administered 2017-02-22 – 2017-02-24 (×16): 2 mg via INTRAVENOUS
  Filled 2017-02-22 (×16): qty 1

## 2017-02-22 MED ORDER — SODIUM CHLORIDE 0.9 % IV SOLN
100.0000 mL | INTRAVENOUS | Status: DC | PRN
Start: 2017-02-22 — End: 2017-02-25

## 2017-02-22 MED ORDER — MORPHINE SULFATE (PF) 2 MG/ML IV SOLN
2.0000 mg | INTRAVENOUS | Status: DC | PRN
  Administered 2017-02-22 (×2): 2 mg via INTRAVENOUS
  Filled 2017-02-22 (×2): qty 1

## 2017-02-22 NOTE — Progress Notes (Signed)
Patient noted more comfortable with better pain control after giving patient PRN Percocet Q6H and using PRN Morphine Q2H for breakthrough pain. By dinner time tonight patient became more alert and talkative with less moaning and crying noted. Patient consumed 100% of her dinner.

## 2017-02-22 NOTE — Progress Notes (Signed)
[name]  MRN: [id number]  DOB/AGE: 1946/03/08 71 y.o.  Primary Care Physician:Fanta, Tesfaye, MD  Admit date: 02/02/2017  Chief Complaint:  Chief Complaint  Patient presents with  . Code Sepsis    S-Pt presented on  02/05/2017 with  Chief Complaint  Patient presents with  . Code Sepsis  .    Pt laying, unable to offer any complaints.   Meds  . heparin  5,000 Units Subcutaneous Q8H  . insulin aspart  0-9 Units Subcutaneous TID WC  . insulin glargine  10 Units Subcutaneous QHS  . levothyroxine  75 mcg Oral QAC breakfast  . sodium chloride flush  3 mL Intravenous Q12H    Physical Exam: Vital signs in last 24 hours: Temp:  [98.8 F (37.1 C)-100.2 F (37.9 C)] 99.2 F (37.3 C) (09/26 0400) Pulse Rate:  [65-80] 69 (09/26 0600) Resp:  [16-34] 16 (09/26 0600) BP: (88-127)/(34-57) 95/37 (09/26 0600) SpO2:  [87 %-99 %] 98 % (09/26 0600) Weight change:  Last BM Date: 02/18/17  Intake/Output from previous day: 09/25 0701 - 09/26 0700 In: 312.5 [IV Piggyback:312.5] Out: 1 [Stool:1] No intake/output data recorded.   Physical Exam: General- pt is resting comfortably, unable to offer any complaints Resp- No acute REsp distress,  Rhonchi+ CVS- S1S2 regular in rate and rhythm GIT- BS+, soft, NT, ND EXT- trace LE Edema, NO Cyanosis Access-right AVF   Lab Results: CBC  Recent Labs  02/21/17 0425 02/22/17 0438  WBC 2.1* 1.1*  HGB 7.8* 7.3*  HCT 25.8* 25.2*  PLT SPECIMEN CHECKED FOR CLOTS 88*    BMET  Recent Labs  02/21/17 0425 02/22/17 0438  NA 140 140  K 4.0 4.1  CL 99* 100*  CO2 29 26  GLUCOSE 154* 95  BUN 26* 40*  CREATININE 3.24* 4.91*  CALCIUM 7.4* 7.5*    MICRO Recent Results (from the past 240 hour(s))  Blood Culture (routine x 2)     Status: None (Preliminary result)   Collection Time: 02/15/2017 12:40 AM  Result Value Ref Range Status   Specimen Description BLOOD LEFT HAND  Final   Special Requests   Final    BOTTLES DRAWN AEROBIC  AND ANAEROBIC Blood Culture adequate volume   Culture NO GROWTH 3 DAYS  Final   Report Status PENDING  Incomplete  Blood Culture (routine x 2)     Status: None (Preliminary result)   Collection Time: 02/22/2017 12:48 AM  Result Value Ref Range Status   Specimen Description RIGHT ANTECUBITAL  Final   Special Requests   Final    BOTTLES DRAWN AEROBIC ONLY Blood Culture results may not be optimal due to an inadequate volume of blood received in culture bottles   Culture NO GROWTH 3 DAYS  Final   Report Status PENDING  Incomplete  MRSA PCR Screening     Status: None   Collection Time: 02/17/2017  7:00 AM  Result Value Ref Range Status   MRSA by PCR NEGATIVE NEGATIVE Final    Comment:        The GeneXpert MRSA Assay (FDA approved for NASAL specimens only), is one component of a comprehensive MRSA colonization surveillance program. It is not intended to diagnose MRSA infection nor to guide or monitor treatment for MRSA infections.       Lab Results  Component Value Date   PTH 317.0 (H) 05/20/2012   CALCIUM 7.5 (L) 02/22/2017   CAION 1.03 (L) 02/13/2017   PHOS 3.4 02/21/2017  Impression: 1)Renal  ESRD on HD                Pt is on Monday/WEdnesday/Friday schedule                Pt will be dialyzed today  2)HTN BP stable   3)Anemia In ESrd The goal for HGb is 9--11 Not sure if  on ESA sec to lung cancer or being held  4)CKD Mineral-Bone Disorder  Secondary Hyperparathyroidism-present. Phosphorus at goal.   5)ID admitted with Sepsis  Primary MD following  6)Electrolytes Normokalemic NOrmonatremic    7)Acid base Co2 at goal  8) Oncology- Lung ca   Undergoing chemo   Anemia and thrombocytopenia   Plan:   Will dialyze today Will use 3k bath  I had discussion with pt grandson about her kidney related issues and her overall prognosis.pt grandson is now 74 years old and is the next of the kin available and active particpant in pt  care.    Latonya Knight S 02/22/2017, 10:25 AM

## 2017-02-22 NOTE — Progress Notes (Addendum)
Daily Progress Note   Patient Name: [name]       Date: 02/22/2017 DOB: [birth year]  Age: 71 y.o. MRN#: [id number] Attending Physician: Annita Brod, MD Primary Care Physician: [name], MD Admit Date: 02/21/2017  Reason for Consultation/Follow-up: Establishing goals of care and Psychosocial/spiritual support  Subjective: [name] is lying quietly in bed she will only briefly open her eyes when I call her name.  She endorses pain, but is unable to tell me more.  Present at bedside today is brother [name], brother Gwyndolyn Saxon, grandson Franklin/Isaiah, sister-in-law [place] and Bethena Roys.  Also present is caregiver, Maudie Mercury who is "like a daughter figure".    We go to my office for a family meeting. Present is brothers Mallie Mussel and Gwyndolyn Saxon, and grandson Franklin/Isaiah. We talk about [name] chronic and acute health problems. We talk about her functional status at home.  We talk what gives her pleasure in life.    They share that she has had decreased mobility over the last few months, and then things "crashed down" after her 1st chemotherapy. Family endorses that after she found out she had stage IV cancer she said, "what's the use?". They share that she has made comments in the past such as "I'm tired". We explore what this means for [name].  I share a diagram of the chronic illness pathway, what is normal and expected.  I share that oncology would likely not give chemotherapy if Mrs. Settled were to remain in this frail state. Family agrees. We review the current treatment plan, and labs.  We talk about the concept of allow a natural death (DNR). All family is in agreement that [name] desire was to allow a natural death. I share that we will continue to treat the treatable.  We talk about the concept of let nature take its course. I share my concern bout [name] ability to wake up, eat and drink.  Family states that after Monday night and it seems like "a switch was flipped".  I share that [name] body will decide whether she is able to improve or not. We talk about 24 to 48 hours for outcomes.  Length of Stay: 3  Current Medications: Scheduled Meds:  . heparin  5,000 Units Subcutaneous Q8H  . insulin aspart  0-9 Units  Subcutaneous TID WC  . insulin glargine  10 Units Subcutaneous QHS  . levothyroxine  75 mcg Oral QAC breakfast  . sodium chloride flush  3 mL Intravenous Q12H    Continuous Infusions: . sodium chloride    . sodium chloride    . sodium chloride    . sodium chloride    . sodium chloride    . piperacillin-tazobactam (ZOSYN)  IV 3.375 g (02/22/17 1246)    PRN Meds: sodium chloride, sodium chloride, sodium chloride, sodium chloride, sodium chloride, acetaminophen, alteplase, heparin, ibuprofen, lidocaine (PF), lidocaine-prilocaine, midodrine, morphine injection, ondansetron **OR** ondansetron (ZOFRAN) IV, oxyCODONE-acetaminophen, pentafluoroprop-tetrafluoroeth, sodium chloride flush  Physical Exam  Constitutional: No distress.  Will only briefly make eye contact, seems calm until touched and then she moans.  HENT:  Head: Atraumatic.  Cardiovascular: Normal rate.   Pulmonary/Chest: Effort normal. No respiratory distress.  Abdominal: Soft. She exhibits no distension.  Musculoskeletal: She exhibits no edema.  Left BKA  Neurological:  Lethargic, will only briefly open eyes when I asked, unable to determine orientation.  Skin: Skin is warm and dry.  Nursing note and vitals reviewed.           Vital Signs: BP (!) 102/37   Pulse 72   Temp 99.2 F (37.3 C) (Oral)   Resp (!) 21   Ht 5\' 3"  (1.6 m)   Wt 71.9 kg (158 lb 8.2 oz)   SpO2 100%   BMI 28.08 kg/m  SpO2: SpO2: 100 % O2 Device: O2 Device: High Flow Nasal Cannula O2  Flow Rate: O2 Flow Rate (L/min): 9 L/min  Intake/output summary:  Intake/Output Summary (Last 24 hours) at 02/22/17 1454 Last data filed at 02/21/17 2230  Gross per 24 hour  Intake            312.5 ml  Output                1 ml  Net            311.5 ml   LBM: Last BM Date: 02/18/17 Baseline Weight: Weight: 71.9 kg (158 lb 8.2 oz) Most recent weight: Weight: 71.9 kg (158 lb 8.2 oz)       Palliative Assessment/Data:    Flowsheet Rows     Most Recent Value  Intake Tab  Referral Department  Hospitalist  Unit at Time of Referral  ICU  Palliative Care Primary Diagnosis  Sepsis/Infectious Disease  Date Notified  02/20/17  Palliative Care Type  New Palliative care  Reason for referral  Clarify Goals of Care  Date of Admission  02/20/2017  Date first seen by Palliative Care  02/20/17  # of days Palliative referral response time  0 Day(s)  # of days IP prior to Palliative referral  1  Clinical Assessment  Palliative Performance Scale Score  30%  Pain Max last 24 hours  Not able to report  Pain Min Last 24 hours  Not able to report  Dyspnea Max Last 24 Hours  Not able to report  Dyspnea Min Last 24 hours  Not able to report  Psychosocial & Spiritual Assessment  Palliative Care Outcomes  Patient/Family meeting held?  Yes  Who was at the meeting?  grandson via phone.   Palliative Care Outcomes  Provided advance care planning, Provided psychosocial or spiritual support, Clarified goals of care      Patient Active Problem List   Diagnosis Date Noted  . Goals of care, counseling/discussion   . Palliative care encounter   .  Sepsis ([place]) 01/31/2017  . Squamous carcinoma of lung, left ([place])   . Squamous cell carcinoma of lung, stage IV ([place]) 02/01/2017  . Lung mass 07/10/2016  . Community acquired pneumonia   . Renal cyst, right   . Renal hemorrhage, right   . Renal mass, right   . Acute blood loss anemia 07/07/2016  . Community acquired pneumonia of left lung   .  Hypothyroidism   . Retroperitoneal bleeding 07/06/2016  . Retroperitoneal bleed 07/06/2016  . End stage renal disease ([place]) 11/21/2012  . Pre-operative cardiovascular examination 11/21/2012  . Ulcer of lower limb, unspecified 07/11/2012  . Atherosclerosis of native arteries of the extremities with ulceration(440.23) 07/11/2012  . Aspiration pneumonia ([place]) 05/31/2012  . ESRD (end stage renal disease) ([place]) 05/30/2012  . Acute respiratory failure ([place]) 05/24/2012  . Severe sepsis ([place]) 05/24/2012  . Sepsis with metabolic encephalopathy ([place]) 05/24/2012  . Clostridium difficile colitis 05/24/2012  . Septic shock(785.52) 05/24/2012  . Fever 05/22/2012  . Femur fracture, left ([place]) 05/19/2012  . Fall at home 05/19/2012  . RENAL DISEASE, CHRONIC, STAGE V 07/29/2008  . ANEMIA OF RENAL FAILURE 04/14/2008  . UPPER RESPIRATORY INFECTION, VIRAL 04/02/2008  . ABDOMINAL PAIN 04/02/2008  . HYPERKERATOSIS 11/12/2007  . UNSPECIFIED DEBILITY 11/12/2007  . COUGH 10/03/2007  . RHINITIS 09/14/2007  . BACK PAIN, LUMBAR 09/14/2007  . CELLULITIS AND ABSCESS OF LEG EXCEPT FOOT 06/18/2007  . Diabetes mellitus type II, uncontrolled ([place]) 05/30/2007  . SYNCOPE 04/09/2007  . LEG CRAMPS 04/02/2007  . ELECTROCARDIOGRAM, ABNORMAL 04/02/2007  . RENAL INSUFFICIENCY, ACUTE 02/16/2007  . EDEMA LEG 10/20/2006  . BRONCHITIS, ACUTE WITH BRONCHOSPASM 09/22/2006  . ROTATOR CUFF INJURY, LEFT SHOULDER 08/08/2006  . FIBROCYSTIC BREAST DISEASE 07/28/2006  . SHOULDER PAIN, LEFT 07/28/2006  . GOITER, MULTINODULAR 05/03/2006  . HYPERLIPIDEMIA 05/03/2006  . Iron deficiency anemia 05/03/2006  . SYNDROME, RESTLESS LEGS 05/03/2006  . Hypertension 05/03/2006  . Peripheral vascular disease ([place]) 05/03/2006  . CONSTIPATION 05/03/2006  . ARTHRITIS 05/03/2006  . PROTEINURIA 05/03/2006  . DIVERTICULITIS, HX OF 05/03/2006    Palliative Care Assessment & Plan   Patient Profile: 71 y.o.femalewith past medical history of  squamous cell lung cancer stage IV on chemotherapy, in stage renal disease on hemodialysis Monday Wednesday Friday, diabetes, hypothyroidism, COPDadmitted on 9/23/2018with sepsis due to healthcare associated pneumonia with altered mental status.   Assessment: sepsis due to healthcare associated pneumonia with altered mental status:  Respiratory failure requiring the use of BiPAP. Able to come off BiPAP 9/24 after dialysis.Continues IV Zosyn. Checking blood cultures, so far negative. 9/26 has two dialysis sessions, but still remains lethargic.  Recommendations/Plan:  At this point, 24 to 48 hours for outcomes.  Goals of Care and Additional Recommendations:  Limitations on Scope of Treatment: continue to treat the treatable, continue dialysis, but no CPR or intubation.  Code Status:    Code Status Orders        Start     Ordered   02/22/17 1221  Do not attempt resuscitation (DNR)  Continuous    Question Answer Comment  In the event of cardiac or respiratory ARREST Do not call a "code blue"   In the event of cardiac or respiratory ARREST Do not perform Intubation, CPR, defibrillation or ACLS   In the event of cardiac or respiratory ARREST Use medication by any route, position, wound care, and other measures to relive pain and suffering. May use oxygen, suction and manual treatment of airway obstruction as needed for comfort.  02/22/17 1220    Code Status History    Date Active Date Inactive Code Status Order ID Comments User Context   02/17/2017  6:50 AM 02/22/2017 12:20 PM Full Code [id number]  Oswald Hillock, MD Inpatient   07/06/2016 10:27 AM 07/12/2016  9:25 PM Full Code [id number]  Eber Jones, MD Inpatient   07/25/2012 12:06 PM 07/27/2012  9:55 PM Full Code [id number]  Newt Minion, MD Inpatient   05/19/2012 11:30 PM 06/04/2012  8:15 PM Full Code [id number]  Derrill Kay, MD Inpatient       Prognosis:  < 6 months, would not be surprising based on frailty, some  functional decline, new diagnosis of stage IV lung cancer. Could possibly be weeks, if Mrs. Cantrell does not improve enough to eat and drink.  Discharge Planning:  To Be Determined  Care plan was discussed with nursing staff, case manager, social worker, and Dr. Gerrie Nordmann.  Thank you for allowing the Palliative Medicine Team to assist in the care of this patient.   Time In: 1105 Time Out: 1210 Total Time 65 minutes Prolonged Time Billed  yes       Greater than 50%  of this time was spent counseling and coordinating care related to the above assessment and plan.  Drue Novel, NP  Please contact Palliative Medicine Team phone at [phone] for questions and concerns.

## 2017-02-22 NOTE — Progress Notes (Signed)
Pharmacy Antibiotic Note  [name] is a 71 y.o. female admitted on 02/24/2017 with sepsis.  Pharmacy has been consulted for zosyn dosing. Tmax 100.2. Encephalophathy has improved. Antibiotic deescalated to zosyn ( MRSA PCR negative). Plan: Continue Zosyn 3.375 gm IV q12 hours F/u cultures and clinical course  Height: 5\' 3"  (160 cm) Weight: 158 lb 8.2 oz (71.9 kg) IBW/kg (Calculated) : 52.4  Temp (24hrs), Avg:99.5 F (37.5 C), Min:98.8 F (37.1 C), Max:100.2 F (37.9 C)   Recent Labs Lab 02/17/17 1945 02/14/2017 0034 02/25/2017 0041 02/13/2017 0349 02/20/17 1642 02/21/17 0425 02/22/17 0438  WBC 28.8* 17.9*  --   --  3.7* 2.1* 1.1*  CREATININE 6.67* 4.15*  --   --  6.31* 3.24* 4.91*  LATICACIDVEN  --   --  1.52 1.15  --   --   --     Estimated Creatinine Clearance: 10 mL/min (A) (by C-G formula based on SCr of 4.91 mg/dL (H)).    Allergies  Allergen Reactions  . Contrast Media [Iodinated Diagnostic Agents] Itching  . Latex Itching    Antimicrobials this admission:  Vanc 9/23 >> 9/25 Zosyn 9/23 >>   Dose adjustments this admission:  ESRD  Microbiology results:  9/23 BCx: ngtd 9/23 MRSA PCR: (-)   Thank you for allowing pharmacy to be a part of this patient's care.  Isac Sarna, BS Pharm D, [place] Clinical Pharmacist Pager [phone]  02/22/2017 8:43 AM

## 2017-02-22 NOTE — Progress Notes (Signed)
Pt placed on 9L HFNC due to dropping O2 saturation. Pt tolerating well. Will continue to monitor closely.

## 2017-02-22 NOTE — Progress Notes (Signed)
Family member at bedside and given an update

## 2017-02-22 NOTE — Progress Notes (Signed)
PROGRESS NOTE    [name]  KDP:[id number] DOB: [birth year] DOA: 02/06/2017 PCP: [name], MD     Brief Narrative:  71 year old woman admitted from home on 9/23 due to altered mental status and fever. She has multiple medical comorbidities including end-stage renal disease on hemodialysis, squamous cell cancer of the lung currently undergoing chemotherapy, COPD, left issues. She received fluid boluses due to sepsis protocol and admission and developed pulmonary edema and has been placed on BiPAP overnight on 9/23. She received dialysis . on 9/24 and was able to come off BiPAP She was found to have  pneumonia and was started on antibiotics. She has since been minimally responsive Family met for formal goals of care palliative care meeting this morning, 9/26. Patient changed to DO NOT RESUSCITATE, but we'll plan for continued treatment over next few days, but if no improvement, follow-up discussion to consider comfort care.  Patient minimally responsive, unable to give me any subjective complaints   Assessment & Plan:   Active Problems:   Sepsis secondary to healthcare associated pneumonia causing acute respiratory failure with hypoxia and with metabolic encephalopathy [facility]): Patient remains minimally responsive. Following dialysis, able to come off of BiPAP. Patient met criteria for sepsis on admission given lactic acidosis, pneumonia source and and/or given  ESRD (end stage renal disease) [facility]): Seen by nephrology. Currently receiving dialysis although in the greater scheme of things, I suspect that she is actively dying.    Hypothyroidism: Continue Synthroid    Squamous cell carcinoma of lung, stage IV (HCC):-To continue outpatient follow-up with oncology if after palliative care meeting family decides to pursue aggressive management.  Anemia of chronic disease: At this time, no transfusion unless hemoglobin greater than 7 although would like to avoid this altogether   DVT  prophylaxis: Subcutaneous heparin Code Status: Full code Family Communication: Left message with family Disposition Plan: Long-term prognosis poor. Awaiting to see how she does have her next few days  Consultants:   Nephrology  Palliative care  Procedures:   None  Antimicrobials:  Anti-infectives    Start     Dose/Rate Route Frequency Ordered Stop   02/20/17 1600  vancomycin (VANCOCIN) IVPB 750 mg/150 ml premix  Status:  Discontinued     750 mg 150 mL/hr over 60 Minutes Intravenous Every M-W-F (Hemodialysis) 02/25/2017 1031 02/21/17 1504   02/08/2017 1200  piperacillin-tazobactam (ZOSYN) IVPB 3.375 g     3.375 g 12.5 mL/hr over 240 Minutes Intravenous Every 12 hours 02/16/2017 0757     02/10/2017 0100  piperacillin-tazobactam (ZOSYN) IVPB 3.375 g     3.375 g 100 mL/hr over 30 Minutes Intravenous  Once 02/16/2017 0054 02/17/2017 0127   02/04/2017 0100  vancomycin (VANCOCIN) IVPB 1000 mg/200 mL premix     1,000 mg 200 mL/hr over 60 Minutes Intravenous  Once 02/16/2017 0054 02/05/2017 0304       Objective: Vitals:   02/22/17 0600 02/22/17 1159 02/22/17 1300 02/22/17 1320  BP: (!) 95/37   (!) 102/37  Pulse: 69  72   Resp: 16 (!) 21    Temp:   99.2 F (37.3 C)   TempSrc:   Oral   SpO2: 98%  100%   Weight:   71.9 kg (158 lb 8.2 oz)   Height:        Intake/Output Summary (Last 24 hours) at 02/22/17 1635 Last data filed at 02/21/17 2230  Gross per 24 hour  Intake  12.5 ml  Output                1 ml  Net             11.5 ml   Filed Weights   02/18/2017 0947 02/20/17 1615 02/22/17 1300  Weight: 71.9 kg (158 lb 8.2 oz) 71.9 kg (158 lb 8.2 oz) 71.9 kg (158 lb 8.2 oz)    Examination:  Gen.: Minimal responsive HEENT: Normocephalic major medical mucous murmurs are dry Neck: Supple, no JVD Respiratory system: Bibasilar crackles, breathing only mildly labored Cardiovascular system: Regular rate and rhythm, S1-S2 Gastrointestinal system: Soft, nondistended and hypoactive  bowel sounds Central nervous system: No focal deficits although exam limited secondary to decreased responsiveness Extremities: Status post left BKA, right with 1+ pedal edema Skin: No rashes, lesions or ulcers Psychiatry: With acute encephalopathy secondary to above  Data Reviewed: I have personally reviewed following labs and imaging studies  CBC:  Recent Labs Lab 02/17/17 1945 01/28/2017 0034 02/20/17 1642 02/21/17 0425 02/22/17 0438  WBC 28.8* 17.9* 3.7* 2.1* 1.1*  NEUTROABS 28.3* 16.6*  --   --  0.3*  HGB 7.9* 8.1* 7.2* 7.8* 7.3*  HCT 25.9* 26.6* 23.7* 25.8* 25.2*  MCV 83.0 83.1 82.0 83.5 84.8  PLT 249 208 113* SPECIMEN CHECKED FOR CLOTS 88*   Basic Metabolic Panel:  Recent Labs Lab 02/17/17 1945 02/14/2017 0034 02/20/17 1642 02/21/17 0425 02/22/17 0438  NA 135 139 141 140 140  K 5.0 4.5 4.4 4.0 4.1  CL 97* 100* 102 99* 100*  CO2 23 26 24 29 26   GLUCOSE 308* 209* 76 154* 95  BUN 45* 28* 50* 26* 40*  CREATININE 6.67* 4.15* 6.31* 3.24* 4.91*  CALCIUM 7.4* 7.6* 7.2* 7.4* 7.5*  PHOS  --   --  6.2* 3.4  --    GFR: Estimated Creatinine Clearance: 10 mL/min (A) (by C-G formula based on SCr of 4.91 mg/dL (H)). Liver Function Tests:  Recent Labs Lab 02/06/2017 0034 02/20/17 1642 02/21/17 0425  AST 139*  --   --   ALT 12*  --   --   ALKPHOS 181*  --   --   BILITOT 0.8  --   --   PROT 7.0  --   --   ALBUMIN 2.3* 1.9* 1.9*   No results for input(s): LIPASE, AMYLASE in the last 168 hours. No results for input(s): AMMONIA in the last 168 hours. Coagulation Profile: No results for input(s): INR, PROTIME in the last 168 hours. Cardiac Enzymes: No results for input(s): CKTOTAL, CKMB, CKMBINDEX, TROPONINI in the last 168 hours. BNP (last 3 results) No results for input(s): PROBNP in the last 8760 hours. HbA1C: No results for input(s): HGBA1C in the last 72 hours. CBG:  Recent Labs Lab 02/21/17 1142 02/21/17 1209 02/21/17 1621 02/21/17 2228 02/22/17 0734    GLUCAP 64* 67 120* 90 100*   Lipid Profile: No results for input(s): CHOL, HDL, LDLCALC, TRIG, CHOLHDL, LDLDIRECT in the last 72 hours. Thyroid Function Tests: No results for input(s): TSH, T4TOTAL, FREET4, T3FREE, THYROIDAB in the last 72 hours. Anemia Panel: No results for input(s): VITAMINB12, FOLATE, FERRITIN, TIBC, IRON, RETICCTPCT in the last 72 hours. Urine analysis:    Component Value Date/Time   COLORURINE YELLOW 02/26/2012 [place] 02/26/2012 2345   LABSPEC 1.015 02/26/2012 2345   PHURINE 7.0 02/26/2012 2345   GLUCOSEU 250 (A) 02/26/2012 2345   HGBUR SMALL (A) 02/26/2012 2345   BILIRUBINUR NEGATIVE 02/26/2012  [place] 02/26/2012 2345   PROTEINUR 100 (A) 02/26/2012 2345   UROBILINOGEN 0.2 02/26/2012 2345   NITRITE NEGATIVE 02/26/2012 2345   LEUKOCYTESUR SMALL (A) 02/26/2012 2345   Sepsis Labs: @LABRCNTIP (procalcitonin:4,lacticidven:4)  ) Recent Results (from the past 240 hour(s))  Blood Culture (routine x 2)     Status: None (Preliminary result)   Collection Time: 01/29/2017 12:40 AM  Result Value Ref Range Status   Specimen Description BLOOD LEFT HAND  Final   Special Requests   Final    BOTTLES DRAWN AEROBIC AND ANAEROBIC Blood Culture adequate volume   Culture NO GROWTH 3 DAYS  Final   Report Status PENDING  Incomplete  Blood Culture (routine x 2)     Status: None (Preliminary result)   Collection Time: 02/23/2017 12:48 AM  Result Value Ref Range Status   Specimen Description RIGHT ANTECUBITAL  Final   Special Requests   Final    BOTTLES DRAWN AEROBIC ONLY Blood Culture results may not be optimal due to an inadequate volume of blood received in culture bottles   Culture NO GROWTH 3 DAYS  Final   Report Status PENDING  Incomplete  MRSA PCR Screening     Status: None   Collection Time: 02/20/2017  7:00 AM  Result Value Ref Range Status   MRSA by PCR NEGATIVE NEGATIVE Final    Comment:        The GeneXpert MRSA Assay  (FDA approved for NASAL specimens only), is one component of a comprehensive MRSA colonization surveillance program. It is not intended to diagnose MRSA infection nor to guide or monitor treatment for MRSA infections.          Radiology Studies: No results found.      Scheduled Meds: . heparin  5,000 Units Subcutaneous Q8H  . insulin aspart  0-9 Units Subcutaneous TID WC  . insulin glargine  10 Units Subcutaneous QHS  . levothyroxine  75 mcg Oral QAC breakfast  . sodium chloride flush  3 mL Intravenous Q12H   Continuous Infusions: . sodium chloride    . sodium chloride    . sodium chloride    . sodium chloride    . sodium chloride    . piperacillin-tazobactam (ZOSYN)  IV 3.375 g (02/22/17 1246)     LOS: 3 days    Time spent: 35 minutes. Greater than 50% of this time was spent in direct contact with the patient coordinating care.     Annita Brod, MD [facility] Pager [phone]  If 7PM-7AM, please contact night-coverage [email or web address] Password [facility] 02/22/2017, 4:35 PM

## 2017-02-23 LAB — BASIC METABOLIC PANEL
Anion gap: 13 (ref 5–15)
BUN: 17 mg/dL (ref 6–20)
CO2: 30 mmol/L (ref 22–32)
CREATININE: 2.69 mg/dL — AB (ref 0.44–1.00)
Calcium: 8.2 mg/dL — ABNORMAL LOW (ref 8.9–10.3)
Chloride: 100 mmol/L — ABNORMAL LOW (ref 101–111)
GFR calc non Af Amer: 17 mL/min — ABNORMAL LOW (ref >60)
GFR, EST AFRICAN AMERICAN: 19 mL/min — AB (ref >60)
GLUCOSE: 123 mg/dL — AB (ref 65–99)
Potassium: 3.6 mmol/L (ref 3.5–5.1)
Sodium: 143 mmol/L (ref 135–145)

## 2017-02-23 LAB — CBC WITH DIFFERENTIAL/PLATELET
BASOS ABS: 0 10*3/uL (ref 0.0–0.1)
Basophils Relative: 0 %
EOS PCT: 2 %
Eosinophils Absolute: 0 10*3/uL (ref 0.0–0.7)
HEMATOCRIT: 25.5 % — AB (ref 36.0–46.0)
Hemoglobin: 7.5 g/dL — ABNORMAL LOW (ref 12.0–15.0)
LYMPHS PCT: 52 %
Lymphs Abs: 0.6 10*3/uL — ABNORMAL LOW (ref 0.7–4.0)
MCH: 25.3 pg — ABNORMAL LOW (ref 26.0–34.0)
MCHC: 29.4 g/dL — AB (ref 30.0–36.0)
MCV: 85.9 fL (ref 78.0–100.0)
MONO ABS: 0.4 10*3/uL (ref 0.1–1.0)
Monocytes Relative: 31 %
Neutro Abs: 0.2 10*3/uL — ABNORMAL LOW (ref 1.7–7.7)
Neutrophils Relative %: 15 %
Platelets: 78 10*3/uL — ABNORMAL LOW (ref 150–400)
RBC: 2.97 MIL/uL — ABNORMAL LOW (ref 3.87–5.11)
RDW: 16.9 % — AB (ref 11.5–15.5)
WBC: 1.1 10*3/uL — CL (ref 4.0–10.5)

## 2017-02-23 LAB — GLUCOSE, CAPILLARY
GLUCOSE-CAPILLARY: 152 mg/dL — AB (ref 65–99)
Glucose-Capillary: 116 mg/dL — ABNORMAL HIGH (ref 65–99)
Glucose-Capillary: 171 mg/dL — ABNORMAL HIGH (ref 65–99)
Glucose-Capillary: 128 mg/dL — ABNORMAL HIGH (ref 65–99)

## 2017-02-23 MED ORDER — SODIUM CHLORIDE 0.9 % IV BOLUS (SEPSIS)
250.0000 mL | Freq: Once | INTRAVENOUS | Status: AC
  Administered 2017-02-23: 250 mL via INTRAVENOUS

## 2017-02-23 MED ORDER — MIDODRINE HCL 5 MG PO TABS
10.0000 mg | ORAL_TABLET | Freq: Once | ORAL | Status: AC
  Administered 2017-02-23: 10 mg via ORAL

## 2017-02-23 NOTE — Progress Notes (Signed)
Patient is requiring PRN pain medication frequently d/t chronic LEFT shoulder pain and cancer pain. PRN morphine noted to be effective only for a short while. Patient cries, moans and yells out in pain frequently. MD notified.

## 2017-02-23 NOTE — Progress Notes (Addendum)
In to see patient at this time. The patient has been becoming more hypotensive throughout the night. The patients bp's have been around 80-85/35-40's. The patient is alert and appears asymptomatic. The patient has had increased pain medication for better pain control and post dialysis today.  MD Olevia Bowens notified at this time of patient current hemodynamic status.  Will give 250 NS bolus and 10mg  midodrine PO once now. Will continue to monitor the patient closely.

## 2017-02-23 NOTE — Progress Notes (Signed)
PROGRESS NOTE    [name]  PFX:[id number] DOB: 10-15-1945 DOA: 02/02/2017 PCP: [name], MD     Brief Narrative:  71 year old woman admitted from home on 9/23 due to altered mental status and fever. She has multiple medical comorbidities including end-stage renal disease on hemodialysis, squamous cell cancer of the lung currently undergoing chemotherapy, COPD, left issues. She received fluid boluses due to sepsis protocol and admission and developed pulmonary edema and has been placed on BiPAP overnight on 9/23. She received dialysis . on 9/24 and was able to come off BiPAP She was found to have  pneumonia and was started on antibiotics. She has since been minimally responsive Family met for formal goals of care palliative care meeting this morning, 9/26. Patient changed to DO NOT RESUSCITATE, but we'll plan for continued treatment over next few days, but if no improvement, follow-up discussion to consider comfort care.  Pt with no complaints although she seems to moan in discomfort or pain when asked to take a deep inspiration.  Blood pressure has been soft over the last 24 hours with systolic ranging in the 32-99.   Assessment & Plan:   Active Problems:   Sepsis secondary to healthcare associated pneumonia causing acute respiratory failure with hypoxia and with metabolic encephalopathy [facility]): Patient remains minimally responsive. Following dialysis, able to come off of BiPAP. Patient met criteria for sepsis on admission given lactic acidosis, pneumonia source and and/or given  ESRD (end stage renal disease) [facility]): Seen by nephrology and last received hemodialysis this morning    Hypothyroidism: Continue Synthroid    Squamous cell carcinoma of lung, stage IV (HCC):-To continue outpatient follow-up with oncology if after palliative care meeting family decides to pursue aggressive management.  Anemia of chronic disease: At this time, no transfusion unless hemoglobin greater than 7  although would like to avoid this altogether  Protein calorie malnutrition: We'll ask nutrition to evaluate   DVT prophylaxis: Subcutaneous heparin Code Status: Full code Family Communication: Grandson at the bedside Disposition Plan: Long-term prognosis poor. Awaiting to see how she does have her next few days  Consultants:   Nephrology  Palliative care  Procedures:   None  Antimicrobials:  Anti-infectives    Start     Dose/Rate Route Frequency Ordered Stop   02/20/17 1600  vancomycin (VANCOCIN) IVPB 750 mg/150 ml premix  Status:  Discontinued     750 mg 150 mL/hr over 60 Minutes Intravenous Every M-W-F (Hemodialysis) 02/02/2017 1031 02/21/17 1504   02/17/2017 1200  piperacillin-tazobactam (ZOSYN) IVPB 3.375 g     3.375 g 12.5 mL/hr over 240 Minutes Intravenous Every 12 hours 02/02/2017 0757     02/24/2017 0100  piperacillin-tazobactam (ZOSYN) IVPB 3.375 g     3.375 g 100 mL/hr over 30 Minutes Intravenous  Once 02/21/2017 0054 02/04/2017 0127   02/18/2017 0100  vancomycin (VANCOCIN) IVPB 1000 mg/200 mL premix     1,000 mg 200 mL/hr over 60 Minutes Intravenous  Once 02/16/2017 0054 02/24/2017 0304       Objective: Vitals:   02/23/17 0808 02/23/17 0840 02/23/17 1200 02/23/17 1342  BP:      Pulse:  72  60  Resp:  (!) 21  10  Temp: (!) 97.4 F (36.3 C)  98.4 F (36.9 C)   TempSrc: Axillary  Axillary   SpO2:  98%  100%  Weight:      Height:        Intake/Output Summary (Last 24 hours) at 02/23/17 1532 Last data  filed at 02/22/17 1720  Gross per 24 hour  Intake                0 ml  Output             1500 ml  Net            -1500 ml   Filed Weights   02/20/17 1615 02/22/17 1300 02/23/17 0500  Weight: 71.9 kg (158 lb 8.2 oz) 71.9 kg (158 lb 8.2 oz) 68.4 kg (150 lb 12.7 oz)    Examination:  Gen.: Alert and oriented 2, no acute distress HEENT: Normocephalic, atraumatic, mucous membranes dry Neck: Supple, no JVD Respiratory system: Bibasilar crackles, poor inspiratory  effort Cardiovascular system: Regular rate and rhythm, S1-S2 Gastrointestinal system: Soft, nondistended and hypoactive bowel sounds Central nervous system: No focal deficits although exam limited secondary to decreased responsiveness Extremities: Status post left BKA, right with 1+ pedal edema Skin: No rashes, lesions or ulcers Psychiatry: Appropriate, no evidence of acute psychoses  Data Reviewed: I have personally reviewed following labs and imaging studies  CBC:  Recent Labs Lab 02/17/17 1945 02/14/2017 0034 02/20/17 1642 02/21/17 0425 02/22/17 0438 02/23/17 0401  WBC 28.8* 17.9* 3.7* 2.1* 1.1* 1.1*  NEUTROABS 28.3* 16.6*  --   --  0.3* 0.2*  HGB 7.9* 8.1* 7.2* 7.8* 7.3* 7.5*  HCT 25.9* 26.6* 23.7* 25.8* 25.2* 25.5*  MCV 83.0 83.1 82.0 83.5 84.8 85.9  PLT 249 208 113* SPECIMEN CHECKED FOR CLOTS 88* 78*   Basic Metabolic Panel:  Recent Labs Lab 01/29/2017 0034 02/20/17 1642 02/21/17 0425 02/22/17 0438 02/23/17 0401  NA 139 141 140 140 143  K 4.5 4.4 4.0 4.1 3.6  CL 100* 102 99* 100* 100*  CO2 26 24 29 26 30   GLUCOSE 209* 76 154* 95 123*  BUN 28* 50* 26* 40* 17  CREATININE 4.15* 6.31* 3.24* 4.91* 2.69*  CALCIUM 7.6* 7.2* 7.4* 7.5* 8.2*  PHOS  --  6.2* 3.4  --   --    GFR: Estimated Creatinine Clearance: 17.8 mL/min (A) (by C-G formula based on SCr of 2.69 mg/dL (H)). Liver Function Tests:  Recent Labs Lab 02/18/2017 0034 02/20/17 1642 02/21/17 0425  AST 139*  --   --   ALT 12*  --   --   ALKPHOS 181*  --   --   BILITOT 0.8  --   --   PROT 7.0  --   --   ALBUMIN 2.3* 1.9* 1.9*   No results for input(s): LIPASE, AMYLASE in the last 168 hours. No results for input(s): AMMONIA in the last 168 hours. Coagulation Profile: No results for input(s): INR, PROTIME in the last 168 hours. Cardiac Enzymes: No results for input(s): CKTOTAL, CKMB, CKMBINDEX, TROPONINI in the last 168 hours. BNP (last 3 results) No results for input(s): PROBNP in the last 8760  hours. HbA1C: No results for input(s): HGBA1C in the last 72 hours. CBG:  Recent Labs Lab 02/22/17 0734 02/22/17 1650 02/22/17 2109 02/23/17 0741 02/23/17 1121  GLUCAP 100* 109* 172* 116* 171*   Lipid Profile: No results for input(s): CHOL, HDL, LDLCALC, TRIG, CHOLHDL, LDLDIRECT in the last 72 hours. Thyroid Function Tests: No results for input(s): TSH, T4TOTAL, FREET4, T3FREE, THYROIDAB in the last 72 hours. Anemia Panel: No results for input(s): VITAMINB12, FOLATE, FERRITIN, TIBC, IRON, RETICCTPCT in the last 72 hours. Urine analysis:    Component Value Date/Time   COLORURINE YELLOW 02/26/2012 [place] 02/26/2012 2345  LABSPEC 1.015 02/26/2012 2345   PHURINE 7.0 02/26/2012 2345   GLUCOSEU 250 (A) 02/26/2012 2345   HGBUR SMALL (A) 02/26/2012 2345   BILIRUBINUR NEGATIVE 02/26/2012 2345   KETONESUR NEGATIVE 02/26/2012 2345   PROTEINUR 100 (A) 02/26/2012 2345   UROBILINOGEN 0.2 02/26/2012 2345   NITRITE NEGATIVE 02/26/2012 2345   LEUKOCYTESUR SMALL (A) 02/26/2012 2345   Sepsis Labs: @LABRCNTIP (procalcitonin:4,lacticidven:4)  ) Recent Results (from the past 240 hour(s))  Blood Culture (routine x 2)     Status: None (Preliminary result)   Collection Time: 02/18/2017 12:40 AM  Result Value Ref Range Status   Specimen Description BLOOD LEFT HAND  Final   Special Requests   Final    BOTTLES DRAWN AEROBIC AND ANAEROBIC Blood Culture adequate volume   Culture NO GROWTH 4 DAYS  Final   Report Status PENDING  Incomplete  Blood Culture (routine x 2)     Status: None (Preliminary result)   Collection Time: 02/05/2017 12:48 AM  Result Value Ref Range Status   Specimen Description RIGHT ANTECUBITAL  Final   Special Requests   Final    BOTTLES DRAWN AEROBIC ONLY Blood Culture results may not be optimal due to an inadequate volume of blood received in culture bottles   Culture NO GROWTH 4 DAYS  Final   Report Status PENDING  Incomplete  MRSA PCR Screening      Status: None   Collection Time: 02/05/2017  7:00 AM  Result Value Ref Range Status   MRSA by PCR NEGATIVE NEGATIVE Final    Comment:        The GeneXpert MRSA Assay (FDA approved for NASAL specimens only), is one component of a comprehensive MRSA colonization surveillance program. It is not intended to diagnose MRSA infection nor to guide or monitor treatment for MRSA infections.          Radiology Studies: No results found.      Scheduled Meds: . heparin  5,000 Units Subcutaneous Q8H  . insulin aspart  0-9 Units Subcutaneous TID WC  . insulin glargine  10 Units Subcutaneous QHS  . levothyroxine  75 mcg Oral QAC breakfast  . sodium chloride flush  3 mL Intravenous Q12H   Continuous Infusions: . sodium chloride    . sodium chloride    . sodium chloride    . sodium chloride    . sodium chloride    . piperacillin-tazobactam (ZOSYN)  IV Stopped (02/23/17 1240)     LOS: 4 days    Time spent: 35 minutes. Greater than 50% of this time was spent in direct contact with the patient coordinating care.     Annita Brod, MD [facility] Pager [phone]  If 7PM-7AM, please contact night-coverage [email or web address] Password [facility] 02/23/2017, 3:32 PM

## 2017-02-23 NOTE — Plan of Care (Signed)
Problem: Pain Managment: Goal: General experience of comfort will improve Outcome: Progressing Patient beginning to show signs of better pain control noted by less crying and moaning, resting more comfortably, starting to eat more and more interactive with staff, patient receiving PRN pain medications that have been effective

## 2017-02-23 NOTE — Progress Notes (Signed)
Subjective: Interval History: Patient is awake his morning. Still she doesn't communicate very well. However she shakes her head to say she doesn't have any pain.  Objective: Vital signs in last 24 hours: Temp:  [97.4 F (36.3 C)-99.2 F (37.3 C)] 97.4 F (36.3 C) (09/27 0808) Pulse Rate:  [52-88] 72 (09/27 0840) Resp:  [10-21] 21 (09/27 0840) BP: (68-126)/(30-53) 73/38 (09/27 0600) SpO2:  [96 %-100 %] 98 % (09/27 0840) Weight:  [68.4 kg (150 lb 12.7 oz)-71.9 kg (158 lb 8.2 oz)] 68.4 kg (150 lb 12.7 oz) (09/27 0500) Weight change:   Intake/Output from previous day: 09/26 0701 - 09/27 0700 In: -  Out: 1500  Intake/Output this shift: No intake/output data recorded.  Generally patient is alert today. Does not answer questions.. Chest: Decreased breath sound otherwise seems to be clear Heart exam revealed regular rate and rhythm Extremities no edema. She has left leg amputation  Lab Results:  Recent Labs  02/22/17 0438 02/23/17 0401  WBC 1.1* 1.1*  HGB 7.3* 7.5*  HCT 25.2* 25.5*  PLT 88* 78*   BMET:   Recent Labs  02/22/17 0438 02/23/17 0401  NA 140 143  K 4.1 3.6  CL 100* 100*  CO2 26 30  GLUCOSE 95 123*  BUN 40* 17  CREATININE 4.91* 2.69*  CALCIUM 7.5* 8.2*   No results for input(s): PTH in the last 72 hours. Iron Studies: No results for input(s): IRON, TIBC, TRANSFERRIN, FERRITIN in the last 72 hours.  Studies/Results: No results found.  I have reviewed the patient's current medications.  Assessment/Plan: Problem #1 difficulty breathing: Possibly multifactorial. [name] status post hemodialysis yesterday. We are able to remove about 1500 mL. [name] on oxygen but seems to be comfortable. Problem #2 end-stage renal disease: Her potassium is normal Problem #3 anemia: Her hemoglobin is below target goal. Patient with pancytopenia. Problem #4 Squamous cell cancer for lung: [name] on chemotherapy Problem #5 bone and mineral disorder: Her calcium  is range  but her phosphorus is high. Patient resently with poor po intake Problem #6 sever malnutrition. Patient remains with poor appetite. Problem #7 pneumonia:  Plan: We'll make arrangements for patient to get dialysis tomorrow We'll check her CBC and renal panel in the morning. We'll continue his present management   LOS: 4 days   Karey Suthers S 02/23/2017,9:12 AM

## 2017-02-24 DIAGNOSIS — R6520 Severe sepsis without septic shock: Secondary | ICD-10-CM

## 2017-02-24 DIAGNOSIS — A419 Sepsis, unspecified organism: Principal | ICD-10-CM

## 2017-02-24 DIAGNOSIS — J189 Pneumonia, unspecified organism: Secondary | ICD-10-CM

## 2017-02-24 DIAGNOSIS — Z794 Long term (current) use of insulin: Secondary | ICD-10-CM

## 2017-02-24 DIAGNOSIS — G9341 Metabolic encephalopathy: Secondary | ICD-10-CM

## 2017-02-24 DIAGNOSIS — D61810 Antineoplastic chemotherapy induced pancytopenia: Secondary | ICD-10-CM | POA: Diagnosis present

## 2017-02-24 DIAGNOSIS — E1129 Type 2 diabetes mellitus with other diabetic kidney complication: Secondary | ICD-10-CM | POA: Diagnosis present

## 2017-02-24 DIAGNOSIS — E0821 Diabetes mellitus due to underlying condition with diabetic nephropathy: Secondary | ICD-10-CM

## 2017-02-24 DIAGNOSIS — N186 End stage renal disease: Secondary | ICD-10-CM

## 2017-02-24 DIAGNOSIS — C3492 Malignant neoplasm of unspecified part of left bronchus or lung: Secondary | ICD-10-CM

## 2017-02-24 DIAGNOSIS — T451X5A Adverse effect of antineoplastic and immunosuppressive drugs, initial encounter: Secondary | ICD-10-CM | POA: Diagnosis present

## 2017-02-24 LAB — RENAL FUNCTION PANEL
ALBUMIN: 1.8 g/dL — AB (ref 3.5–5.0)
Anion gap: 12 (ref 5–15)
BUN: 28 mg/dL — AB (ref 6–20)
CO2: 28 mmol/L (ref 22–32)
CREATININE: 4.02 mg/dL — AB (ref 0.44–1.00)
Calcium: 7.7 mg/dL — ABNORMAL LOW (ref 8.9–10.3)
Chloride: 98 mmol/L — ABNORMAL LOW (ref 101–111)
GFR calc Af Amer: 12 mL/min — ABNORMAL LOW (ref >60)
GFR, EST NON AFRICAN AMERICAN: 10 mL/min — AB (ref >60)
GLUCOSE: 86 mg/dL (ref 65–99)
PHOSPHORUS: 3.7 mg/dL (ref 2.5–4.6)
POTASSIUM: 3.8 mmol/L (ref 3.5–5.1)
SODIUM: 138 mmol/L (ref 135–145)

## 2017-02-24 LAB — GLUCOSE, CAPILLARY
GLUCOSE-CAPILLARY: 73 mg/dL (ref 65–99)
GLUCOSE-CAPILLARY: 100 mg/dL — AB (ref 65–99)
Glucose-Capillary: 77 mg/dL (ref 65–99)
Glucose-Capillary: 64 mg/dL — ABNORMAL LOW (ref 65–99)

## 2017-02-24 LAB — CBC WITH DIFFERENTIAL/PLATELET
BASOS ABS: 0 10*3/uL (ref 0.0–0.1)
BASOS PCT: 0 %
EOS ABS: 0 10*3/uL (ref 0.0–0.7)
EOS PCT: 1 %
HCT: 27 % — ABNORMAL LOW (ref 36.0–46.0)
Hemoglobin: 7.7 g/dL — ABNORMAL LOW (ref 12.0–15.0)
LYMPHS ABS: 0.9 10*3/uL (ref 0.7–4.0)
Lymphocytes Relative: 37 %
MCH: 24.6 pg — ABNORMAL LOW (ref 26.0–34.0)
MCHC: 28.5 g/dL — AB (ref 30.0–36.0)
MCV: 86.3 fL (ref 78.0–100.0)
Monocytes Absolute: 0.7 10*3/uL (ref 0.1–1.0)
Monocytes Relative: 28 %
NEUTROS PCT: 34 %
Neutro Abs: 0.8 10*3/uL — ABNORMAL LOW (ref 1.7–7.7)
PLATELETS: 79 10*3/uL — AB (ref 150–400)
RBC: 3.13 MIL/uL — AB (ref 3.87–5.11)
RDW: 17.4 % — AB (ref 11.5–15.5)
WBC: 2.3 10*3/uL — AB (ref 4.0–10.5)

## 2017-02-24 LAB — CULTURE, BLOOD (ROUTINE X 2)
CULTURE: NO GROWTH
Culture: NO GROWTH
Special Requests: ADEQUATE

## 2017-02-24 LAB — AMMONIA: AMMONIA: 31 umol/L (ref 9–35)

## 2017-02-24 LAB — VITAMIN B12: Vitamin B-12: 1437 pg/mL — ABNORMAL HIGH (ref 180–914)

## 2017-02-24 LAB — TSH: TSH: 2.992 u[IU]/mL (ref 0.350–4.500)

## 2017-02-24 MED ORDER — HEPARIN SODIUM (PORCINE) 1000 UNIT/ML IJ SOLN
INTRAMUSCULAR | Status: AC
  Administered 2017-02-24: 1000 [IU] via INTRAVENOUS_CENTRAL
  Filled 2017-02-24: qty 6

## 2017-02-24 MED ORDER — MORPHINE SULFATE (PF) 2 MG/ML IV SOLN
1.0000 mg | INTRAVENOUS | Status: DC | PRN
  Administered 2017-02-24 – 2017-02-25 (×9): 2 mg via INTRAVENOUS
  Filled 2017-02-24 (×9): qty 1

## 2017-02-24 MED ORDER — DEXTROSE 5 % IV SOLN
1.0000 g | INTRAVENOUS | Status: DC
  Administered 2017-02-24: 1 g via INTRAVENOUS
  Filled 2017-02-24 (×4): qty 10

## 2017-02-24 MED ORDER — DEXTROSE 50 % IV SOLN
25.0000 mL | Freq: Once | INTRAVENOUS | Status: AC
  Administered 2017-02-24: 25 mL via INTRAVENOUS
  Filled 2017-02-24: qty 50

## 2017-02-24 MED ORDER — SODIUM CHLORIDE 0.9 % IV SOLN: 100.0000 mL | INTRAVENOUS | Status: DC | PRN

## 2017-02-24 NOTE — Progress Notes (Addendum)
PROGRESS NOTE    [name]  PNT:[id number] DOB: 1946-01-31 DOA: 02/10/2017 PCP: [name], MD    Brief Narrative:  [name]  is a 71 y.o. 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 She was brought to hospital with altered mental status and fever. In the ED she was febrile with a temperature of 102.5 and with a low-normal blood pressures. Her white blood cell count was 17.9. Her chest x-ray revealed developing infiltrate/atelectasis right lung base and left hilar mass without change since prior study. Blood cultures were ordered and broad-spectrum antibiotics were started. She was given IV fluid boluses for  sepsis from healthcare associated pneumonia. She subsequently developed pulmonary edema which this is a dictated treatment with BiPAP and emergent hemodialysis. Her mental status improved slightly, but she has been minimally responsive. Palliative care was consulted and met with the family on 9/26 for goals of care. Her status was changed to a DNR. Her prognosis is poor and clinical management will depend on her status over the next few days; consider comfort care.  Assessment & Plan:   Principal Problem:   Sepsis with metabolic encephalopathy ([place]) Active Problems:   Healthcare-associated pneumonia   Squamous cell carcinoma of lung, stage IV (HCC)   ESRD (end stage renal disease) (HCC)   Hypothyroidism   Goals of care, counseling/discussion   Palliative care encounter   Diabetes mellitus with renal complications ([place])   Antineoplastic chemotherapy induced pancytopenia ([place])   1. Sepsis secondary to healthcare associated pneumonia causing acute respiratory failure with hypoxia and metabolic acidosis. Patient was started on IV antibiotics with Zosyn and vancomycin. She was bolused with IV fluids in the ED which subsequently led to  pulmonary edema. She was started on BiPAP and given emergent hemodialysis which improved her respiratory status. -Her hypoxia and sepsis parameters have improved. Her white blood cell count has improved and is now in the leukopenic range. Her fever has resolved. Blood cultures negative to date. -We'll discontinue vancomycin and Zosyn due to pancytopenia and resolution of sepsis and negative MRSA screen.. [name] on day 6 of antibiotic treatment. Consider 7-10 days of total treatment.   Anemia of chronic disease/pancytopenia with leukopenia-thrombocytopenia-anemia.  Per chart review, the patient has a history of anemia, thought to be secondary to lung cancer and ESRD. Baseline range appears to be 7-10. It was 8.1 on admission. Her platelet count was within normal limit on admission, but has fallen precipitously. Her white blood cell count was elevated on admission, but has fallen to the leukopenic range.  -Patient had been on chemotherapy, but her platelet count and white blood cell count were apparently within normal limits, so pancytopenia may be impart due to sepsis, possibly antibiotics and with a contribution from heparin causing thrombocytopenia. -Apparently she is scheduled for Neulasta every 3 weeks. -We'll discontinue heparin and start SCDs due to thrombocytopenia. -Transfuse packed red blood cells if hemoglobin falls below 7. -Will discontinue vancomycin and narrow antibiotic tx to Rocephin only.  Diabetes mellitus with nephropathy. Patient is treated chronically with Trajenta, Lantus and Humalog. -Trajenta is on hold. She was started on Lantus and sliding scale NovoLog. -[name] eating very little and her CBGs are on the lower end of normal. Therefore, we'll discontinue Lantus.  End-stage renal disease. Patient is being followed by nephrology. She continues to be treated with hemodialysis for now.  History of hypertension.  Patient is treated chronically with amlodipine. It is being held  due to low-normal blood pressures.  Hypothyroidism. She was continued on Synthroid. Currently stable.  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 The patient is on chemotherapy, currently on hold due to sepsis and poor prognosis. Her prognosis appears to be grave. CT of abdomen/pelvis on 9/21 revealed interval worsening of numerous liver metastases. Patient appears to be a candidate for comfort care; depending on family's wishes. -Palliative care was consulted and is following. DO NOT RESUSCITATE status established.  Acute encephalopathy. Per previous report, the patient's encephalopathy had been subsiding, but she still appears to have some confusion. -We'll check a TSH, ammonia level and vitamin B12 level.     DVT prophylaxis: Subcutaneous heparin Code Status: DO NOT RESUSCITATE Family Communication: Next of kin not available, but discussed with her caretaker, Maudie Mercury Disposition Plan: Disposition depends on clinical course and the patient with long-term poor prognosis   Consultants:   Nephrology  Palliative care  Procedures:   Hemodialysis  BiPAP  Antimicrobials:   Rocephin 9/28>>  Zosyn 9/23>>9//28  Vancomycin 9/24>> 9/28   Subjective: No acute changes overnight per nursing and her caretaker who is in the room, Kim. Patient questioned about pain, but she does not answer. She continues to moan periodically which Maudie Mercury says is constant regardless if she is in pain or not.  Objective: Vitals:   02/24/17 0400 02/24/17 0500 02/24/17 0553 02/24/17 0600  BP: (!) 70/39 (!) 99/36  (!) 93/40  Pulse: 60 66  68  Resp: '11 12  12  '$ Temp:   98.4 F (36.9 C)   TempSrc:   Axillary   SpO2: 95% 100%  100%  Weight:   69.1 kg (152 lb 5.4 oz)   Height:        Intake/Output Summary (Last 24 hours) at 02/24/17 0742 Last data filed at 02/24/17 0500  Gross per 24 hour  Intake              200 ml  Output                0 ml  Net              200 ml   Filed Weights     02/22/17 1300 02/23/17 0500 02/24/17 0553  Weight: 71.9 kg (158 lb 8.2 oz) 68.4 kg (150 lb 12.7 oz) 69.1 kg (152 lb 5.4 oz)    Examination:  General exam: 71 year old African-American woman who is moaning, but does not appear to be in any respiratory or otherwise distress. (Nursing reports patient moans all the time).  Respiratory system: Clear anteriorly but with a few bibasal crackles.Marland Kitchen Respiratory effort normal. Cardiovascular system: S1 & S2 with occasional ectopy and a soft systolic murmur.. No pedal edema. Gastrointestinal system: Abdomen is nondistended, soft and nontender. No organomegaly or masses felt. Normal bowel sounds heard. Central nervous system: Alert, but does not interact or respond to questions. She has eye contact but does not follow directions. No obvious focal neurological deficits. Extremities: Left BKA; trace pedal edema on the left. Skin: No rashes, lesions or ulcers Psychiatry: Judgement and insight difficult to assess. Mood & affect flat     Data Reviewed: I have personally reviewed following labs and imaging studies  CBC:  Recent Labs Lab 02/17/17 1945 02/22/2017 0034 02/20/17 1642 02/21/17 0425 02/22/17 0438 02/23/17 0401 02/24/17 0428  WBC 28.8* 17.9* 3.7* 2.1* 1.1* 1.1* 2.3*  NEUTROABS 28.3* 16.6*  --   --  0.3* 0.2* 0.8*  HGB 7.9* 8.1* 7.2* 7.8* 7.3* 7.5* 7.7*  HCT 25.9* 26.6* 23.7* 25.8* 25.2* 25.5* 27.0*  MCV 83.0 83.1 82.0 83.5 84.8 85.9 86.3  PLT 249 208 113* SPECIMEN CHECKED FOR CLOTS 88* 78* 79*   Basic Metabolic Panel:  Recent Labs Lab 02/20/17 1642 02/21/17 0425 02/22/17 0438 02/23/17 0401 02/24/17 0428  NA 141 140 140 143 138  K 4.4 4.0 4.1 3.6 3.8  CL 102 99* 100* 100* 98*  CO2 _0 GLUCOSE 76 154* 95 123* 86  BUN 50* 26* 40* 17 28*  CREATININE 6.31* 3.24* 4.91* 2.69* 4.02*  CALCIUM 7.2* 7.4* 7.5* 8.2* 7.7*  PHOS 6.2* 3.4  --   --  3.7   GFR: Estimated Creatinine Clearance: 12 mL/min (A) (by C-G formula  based on SCr of 4.02 mg/dL (H)). Liver Function Tests:  Recent Labs Lab 02/04/2017 0034 02/20/17 1642 02/21/17 0425 02/24/17 0428  AST 139*  --   --   --   ALT 12*  --   --   --   ALKPHOS 181*  --   --   --   BILITOT 0.8  --   --   --   PROT 7.0  --   --   --   ALBUMIN 2.3* 1.9* 1.9* 1.8*   No results for input(s): LIPASE, AMYLASE in the last 168 hours. No results for input(s): AMMONIA in the last 168 hours. Coagulation Profile: No results for input(s): INR, PROTIME in the last 168 hours. Cardiac Enzymes: No results for input(s): CKTOTAL, CKMB, CKMBINDEX, TROPONINI in the last 168 hours. BNP (last 3 results) No results for input(s): PROBNP in the last 8760 hours. HbA1C: No results for input(s): HGBA1C in the last 72 hours. CBG:  Recent Labs Lab 02/22/17 2109 02/23/17 0741 02/23/17 1121 02/23/17 1558 02/23/17 2101  GLUCAP 172* 116* 171* 152* 128*   Lipid Profile: No results for input(s): CHOL, HDL, LDLCALC, TRIG, CHOLHDL, LDLDIRECT in the last 72 hours. Thyroid Function Tests: No results for input(s): TSH, T4TOTAL, FREET4, T3FREE, THYROIDAB in the last 72 hours. Anemia Panel: No results for input(s): VITAMINB12, FOLATE, FERRITIN, TIBC, IRON, RETICCTPCT in the last 72 hours. Sepsis Labs:  Recent Labs Lab 01/29/2017 0041 02/22/2017 0349  LATICACIDVEN 1.52 1.15    Recent Results (from the past 240 hour(s))  Blood Culture (routine x 2)     Status: None   Collection Time: 02/12/2017 12:40 AM  Result Value Ref Range Status   Specimen Description BLOOD LEFT HAND  Final   Special Requests   Final    BOTTLES DRAWN AEROBIC AND ANAEROBIC Blood Culture adequate volume   Culture NO GROWTH 5 DAYS  Final   Report Status 02/24/2017 FINAL  Final  Blood Culture (routine x 2)     Status: None   Collection Time: 02/20/2017 12:48 AM  Result Value Ref Range Status   Specimen Description RIGHT ANTECUBITAL  Final   Special Requests   Final    BOTTLES DRAWN AEROBIC ONLY Blood Culture  results may not be optimal due to an inadequate volume of blood received in culture bottles   Culture NO GROWTH 5 DAYS  Final   Report Status 02/24/2017 FINAL  Final  MRSA PCR Screening     Status: None   Collection Time: 02/10/2017  7:00 AM  Result Value Ref Range Status   MRSA by PCR NEGATIVE NEGATIVE Final    Comment:  The GeneXpert MRSA Assay (FDA approved for NASAL specimens only), is one component of a comprehensive MRSA colonization surveillance program. It is not intended to diagnose MRSA infection nor to guide or monitor treatment for MRSA infections.          Radiology Studies: No results found.      Scheduled Meds: . heparin  5,000 Units Subcutaneous Q8H  . insulin aspart  0-9 Units Subcutaneous TID WC  . insulin glargine  10 Units Subcutaneous QHS  . levothyroxine  75 mcg Oral QAC breakfast  . sodium chloride flush  3 mL Intravenous Q12H   Continuous Infusions: . sodium chloride    . sodium chloride    . sodium chloride    . sodium chloride    . sodium chloride    . piperacillin-tazobactam (ZOSYN)  IV Stopped (02/24/17 0100)     LOS: 5 days    Time spent: 45 minutes    Rexene Alberts, MD [facility] Pager [phone]  If 7PM-7AM, please contact night-coverage [email or web address] Password [facility] 02/24/2017, 7:42 AM

## 2017-02-24 NOTE — Progress Notes (Signed)
Daily Progress Note   Patient Name: [name]       Date: 02/24/2017 DOB: 1945/08/19  Age: 71 y.o. MRN#: [id number] Attending Physician: Rexene Alberts, MD Primary Care Physician: [name], MD Admit Date: 02/09/2017  Reason for Consultation/Follow-up: Establishing goals of care, Hospice Evaluation, Inpatient hospice referral and Psychosocial/spiritual support  Subjective: [name] is lying in bed. She will spontaneously open her eyes, but will only briefly make eye contact.She does not attempt to answer my questions in any way.She will only moan and cry out when touched.  She was unable to complete more than 8 minutes of her hemodialysis treatment today. Her blood pressure became so low that treatment was stopped.  [name] not interacting in any meaningful way and will cry out in pain even when not being touched. Her blood pressure remains low in the 70s.  Call to brother, Clide Deutscher decision-maker. We talk in detail about Mrs.  [name] mental status, her inability to complete hemodialysis, her crying out in pain, and her very low albumin at 1.8.  I share hospitalist and nephrology's suggestion for comfort measures only. I share that palliative medicine also recommends comfort measures only. We discuss what this would look like. We discuss discontinuation of hemodialysis. The use of medications for pain. We also discussed transition to hospice. I suggest hospice home of [facility] for comfort and dignity at end-of-life, but Rhetta Mura that family can accept in-home hospice services, if they so desire. I share that in-home hospice services would mean that the family would have to stay 24/7. All questions answered. Mallie Mussel states that he has seen these declines in his sister, but that  his family is in denial. We share some talking points. Mallie Mussel states that he will call his family, and call palliative medicine for decision about direction in care.   Call to brother [name] later in the afternoon. He shares with me that he has not spoken with his siblings at this point, he tells me that they do not get off of work until 5 PM. I share my concerns over [name]'s rapid decline. We talk about her low blood pressure, her low respiratory rate, her heart rate and oxygen levels. I share my concern that even with every intervention, [name] may only have days to live. Brother Mallie Mussel  Javier Glazier states he thinks the same. I share that she's continues to cry out in pain, but we are unable to pain medication due to low blood pressure.  He shares, "do what you can for her", but he does not endorse full comfort care at this time.  Mallie Mussel states that he will reach out to his siblings.   Call to [facility] regarding use of BIPAP, no answer.  Return call to [facility] who states YES to BiPAP, as that it will give him time to talk with his siblings, "and if they can't see reason after that, then that's on them".    Length of Stay: 5  Current Medications: Scheduled Meds:  . heparin      . insulin aspart  0-9 Units Subcutaneous TID WC  . levothyroxine  75 mcg Oral QAC breakfast  . sodium chloride flush  3 mL Intravenous Q12H    Continuous Infusions: . sodium chloride    . sodium chloride    . sodium chloride    . sodium chloride    . sodium chloride    . sodium chloride    . sodium chloride    . cefTRIAXone (ROCEPHIN)  IV Stopped (02/24/17 1000)    PRN Meds: sodium chloride, sodium chloride, sodium chloride, sodium chloride, sodium chloride, sodium chloride, sodium chloride, acetaminophen, alteplase, heparin, ibuprofen, lidocaine (PF), lidocaine-prilocaine, midodrine, morphine injection, ondansetron **OR** ondansetron (ZOFRAN) IV, oxyCODONE-acetaminophen, pentafluoroprop-tetrafluoroeth, sodium  chloride flush  Physical Exam  Constitutional: She appears distressed.  Briefly makes but does not keep eye contact. Moaning out in pain when touched  HENT:  Head: Atraumatic.  Cardiovascular: Normal rate.   Pulmonary/Chest: Effort normal. No respiratory distress.  Abdominal: Soft. She exhibits no distension.  Musculoskeletal: She exhibits no edema.  Left BKA  Neurological:  Spontaneously opens eyes, does not answer questions, even with shaking/nodding head.  Skin: Skin is warm and dry.  Nursing note and vitals reviewed.           Vital Signs: BP (!) 76/31   Pulse 63   Temp 97.9 F (36.6 C) (Axillary)   Resp 11   Ht 5\' 3"  (1.6 m)   Wt 69.1 kg (152 lb 5.4 oz)   SpO2 96%   BMI 26.99 kg/m  SpO2: SpO2: 96 % O2 Device: O2 Device: High Flow Nasal Cannula O2 Flow Rate: O2 Flow Rate (L/min): 6 L/min  Intake/output summary:  Intake/Output Summary (Last 24 hours) at 02/24/17 1134 Last data filed at 02/24/17 0500  Gross per 24 hour  Intake              200 ml  Output                0 ml  Net              200 ml   LBM: Last BM Date: 02/23/17 Baseline Weight: Weight: 71.9 kg (158 lb 8.2 oz) Most recent weight: Weight: 69.1 kg (152 lb 5.4 oz)       Palliative Assessment/Data:    Flowsheet Rows     Most Recent Value  Intake Tab  Referral Department  Hospitalist  Unit at Time of Referral  ICU  Palliative Care Primary Diagnosis  Sepsis/Infectious Disease  Date Notified  02/20/17  Palliative Care Type  New Palliative care  Reason for referral  Clarify Goals of Care  Date of Admission  01/29/2017  Date first seen by Palliative Care  02/20/17  # of days Palliative referral response time  0 Day(s)  # of days IP prior to Palliative referral  1  Clinical Assessment  Palliative Performance Scale Score  30%  Pain Max last 24 hours  Not able to report  Pain Min Last 24 hours  Not able to report  Dyspnea Max Last 24 Hours  Not able to report  Dyspnea Min Last 24 hours  Not able  to report  Psychosocial & Spiritual Assessment  Palliative Care Outcomes  Patient/Family meeting held?  Yes  Who was at the meeting?  grandson via phone.   Palliative Care Outcomes  Provided advance care planning, Provided psychosocial or spiritual support, Clarified goals of care      Patient Active Problem List   Diagnosis Date Noted  . Diabetes mellitus with renal complications ([place]) 93/81/0175  . Antineoplastic chemotherapy induced pancytopenia ([place]) 02/24/2017  . Goals of care, counseling/discussion   . Palliative care encounter   . Sepsis ([place]) 02/22/2017  . Squamous carcinoma of lung, left ([place])   . Squamous cell carcinoma of lung, stage IV ([place]) 02/01/2017  . Lung mass 07/10/2016  . Healthcare-associated pneumonia   . Renal cyst, right   . Renal hemorrhage, right   . Renal mass, right   . Acute blood loss anemia 07/07/2016  . Community acquired pneumonia of left lung   . Hypothyroidism   . Retroperitoneal bleeding 07/06/2016  . Retroperitoneal bleed 07/06/2016  . End stage renal disease ([place]) 11/21/2012  . Pre-operative cardiovascular examination 11/21/2012  . Ulcer of lower limb, unspecified 07/11/2012  . Atherosclerosis of native arteries of the extremities with ulceration(440.23) 07/11/2012  . Aspiration pneumonia ([place]) 05/31/2012  . ESRD (end stage renal disease) ([place]) 05/30/2012  . Acute respiratory failure ([place]) 05/24/2012  . Severe sepsis ([place]) 05/24/2012  . Sepsis with metabolic encephalopathy ([place]) 05/24/2012  . Clostridium difficile colitis 05/24/2012  . Septic shock(785.52) 05/24/2012  . Fever 05/22/2012  . Femur fracture, left ([place]) 05/19/2012  . Fall at home 05/19/2012  . RENAL DISEASE, CHRONIC, STAGE V 07/29/2008  . ANEMIA OF RENAL FAILURE 04/14/2008  . UPPER RESPIRATORY INFECTION, VIRAL 04/02/2008  . ABDOMINAL PAIN 04/02/2008  . HYPERKERATOSIS 11/12/2007  . UNSPECIFIED DEBILITY 11/12/2007  . COUGH 10/03/2007  . RHINITIS 09/14/2007  . BACK PAIN,  LUMBAR 09/14/2007  . CELLULITIS AND ABSCESS OF LEG EXCEPT FOOT 06/18/2007  . Diabetes mellitus type II, uncontrolled ([place]) 05/30/2007  . SYNCOPE 04/09/2007  . LEG CRAMPS 04/02/2007  . ELECTROCARDIOGRAM, ABNORMAL 04/02/2007  . RENAL INSUFFICIENCY, ACUTE 02/16/2007  . EDEMA LEG 10/20/2006  . BRONCHITIS, ACUTE WITH BRONCHOSPASM 09/22/2006  . ROTATOR CUFF INJURY, LEFT SHOULDER 08/08/2006  . FIBROCYSTIC BREAST DISEASE 07/28/2006  . SHOULDER PAIN, LEFT 07/28/2006  . GOITER, MULTINODULAR 05/03/2006  . HYPERLIPIDEMIA 05/03/2006  . Iron deficiency anemia 05/03/2006  . SYNDROME, RESTLESS LEGS 05/03/2006  . Hypertension 05/03/2006  . Peripheral vascular disease ([place]) 05/03/2006  . CONSTIPATION 05/03/2006  . ARTHRITIS 05/03/2006  . PROTEINURIA 05/03/2006  . DIVERTICULITIS, HX OF 05/03/2006    Palliative Care Assessment & Plan   Patient Profile: 71 y.o.femalewith past medical history of squamous cell lung cancer stage IV on chemotherapy, in stage renal disease on hemodialysis Monday Wednesday Friday, diabetes, hypothyroidism, COPDadmitted on 9/23/2018with sepsis due to healthcare associated pneumonia with altered mental status.   Assessment: sepsis due to healthcare associated pneumonia with altered mental status:  Respiratory failure requiring the use of BiPAP. Able to come off BiPAP 9/24 after dialysis.Continues IV Zosyn. Blood cultures negative. Respiratory status has improved, fever has resolved,  discontinue vancomycin and Zosyn,  IV antibiotic to do pancytopenia, likely related to chemotherapy.  Rocephin consider 1710 days of treatment, on day 6 now. Continued lethargy; low blood pressures, unable to tolerate hemodialysis, ammonia relatively normal. Mrs. Yackley seems to be near end of life. Recurrent cancer with metastatic burden.  Recommendations/Plan:  Family is considering transition to comfort measures only. We discuss transition to the Hospice home of [facility] for  comfort and dignity at end of life vs home with hospice services.  Main decision maker, brother [name] states he will contact family to discuss with them.   Goals of Care and Additional Recommendations:  Limitations on Scope of Treatment: at this point, continue to treat the treatable, but no CPR no intubation.  considering comfort measures only, no hemodialysis  Code Status:    Code Status Orders        Start     Ordered   02/22/17 1221  Do not attempt resuscitation (DNR)  Continuous    Question Answer Comment  In the event of cardiac or respiratory ARREST Do not call a "code blue"   In the event of cardiac or respiratory ARREST Do not perform Intubation, CPR, defibrillation or ACLS   In the event of cardiac or respiratory ARREST Use medication by any route, position, wound care, and other measures to relive pain and suffering. May use oxygen, suction and manual treatment of airway obstruction as needed for comfort.      02/22/17 1220    Code Status History    Date Active Date Inactive Code Status Order ID Comments User Context   02/17/2017  6:50 AM 02/22/2017 12:20 PM Full Code [id number]  Oswald Hillock, MD Inpatient   07/06/2016 10:27 AM 07/12/2016  9:25 PM Full Code [id number]  Eber Jones, MD Inpatient   07/25/2012 12:06 PM 07/27/2012  9:55 PM Full Code [id number]  Newt Minion, MD Inpatient   05/19/2012 11:30 PM 06/04/2012  8:15 PM Full Code [id number]  Derrill Kay, MD Inpatient       Prognosis:   < 2 weeks, possibly days,  would not be surprising even with all interventions. Based on albumin of 1.8, unable to tolerate dialysis without low blood pressure, frailty.   Discharge Planning:  family is considering hospice whether at home or at [place] was discussed with nursing staff, case manager, social worker, snd Dr. Caryn Section.  Thank you for allowing the Palliative Medicine Team to assist in the care of this patient.   Time In:  1030 1340 Time Out: 1140 1410 Total Time 70+30=100 minutes Prolonged Time Billed  yes       Greater than 50%  of this time was spent counseling and coordinating care related to the above assessment and plan.  Drue Novel, NP  Please contact Palliative Medicine Team phone at [phone] for questions and concerns.

## 2017-02-24 NOTE — Procedures (Signed)
    HEMODIALYSIS TREATMENT NOTE:   Pre-dialysis BP: 69/33, 77/63.  Hold dialysis today, per Dr. Lowanda Foster.  Rockwell Alexandria, RN, CDN

## 2017-02-24 NOTE — Progress Notes (Signed)
Subjective: Interval History: No significant change in her condition. Patient is somnolent. She moans when attempt was made to talk with her.  Objective: Vital signs in last 24 hours: Temp:  [98.1 F (36.7 C)-98.5 F (36.9 C)] 98.4 F (36.9 C) (09/28 0553) Pulse Rate:  [56-69] 69 (09/28 0900) Resp:  [7-23] 20 (09/28 0900) BP: (66-110)/(33-49) 110/49 (09/28 0900) SpO2:  [89 %-100 %] 96 % (09/28 0900) Weight:  [69.1 kg (152 lb 5.4 oz)] 69.1 kg (152 lb 5.4 oz) (09/28 0553) Weight change: -2.8 kg (-6 lb 2.8 oz)  Intake/Output from previous day: 09/27 0701 - 09/28 0700 In: 200 [IV Piggyback:200] Out: -  Intake/Output this shift: No intake/output data recorded.  Generally patient is sleepy but arousable Chest: Decreased breath sound otherwise seems to be clear Heart exam revealed regular rate and rhythm Extremities no edema. She has left leg amputation  Lab Results:  Recent Labs  02/23/17 0401 02/24/17 0428  WBC 1.1* 2.3*  HGB 7.5* 7.7*  HCT 25.5* 27.0*  PLT 78* 79*   BMET:   Recent Labs  02/23/17 0401 02/24/17 0428  NA 143 138  K 3.6 3.8  CL 100* 98*  CO2 30 28  GLUCOSE 123* 86  BUN 17 28*  CREATININE 2.69* 4.02*  CALCIUM 8.2* 7.7*   No results for input(s): PTH in the last 72 hours. Iron Studies: No results for input(s): IRON, TIBC, TRANSFERRIN, FERRITIN in the last 72 hours.  Studies/Results: No results found.  I have reviewed the patient's current medications.  Assessment/Plan: Problem #1 difficulty breathing: Possibly multifactorial. Patient is on oxygen and seems to be comfortable. Problem #2 end-stage renal disease: [name] status post hemodialysis on Wednesday. Her potassium is normal  Problem #3 anemia: Her hemoglobin is below target goal. Patient with pancytopenia her hemoglobin remains stable. Presently not on Epogen.. Problem #4 Squamous cell cancer for lung: [name] on chemotherapy Problem #5 bone and mineral disorder: Her calcium  And  phosphorus is in range. Problem #6 sever malnutrition. Patient remains with poor appetite. Problem #7 pneumonia: Patient on antibiotics. [name] a febrile  Patient overall was poor prognoses. [name] DO NOT RESUSCITATE. Patient may benefit from comfort only. Plan: We'll make arrangements for patient to get dialysis today We'll check her CBC and renal panel in the morning.   LOS: 5 days   Yuriko Portales S 02/24/2017,9:41 AM

## 2017-02-24 NOTE — Plan of Care (Signed)
Problem: Skin Integrity: Goal: Risk for impaired skin integrity will decrease Outcome: Progressing Patient on every 2 hour rotation to prevent skin breakdown.

## 2017-02-24 NOTE — Progress Notes (Signed)
Patient given morphine because she was moaning and crying out. Patient is very guarded and upset with any touch. Even after patient given 2mg  morphine she is still moaning loudly. At this time family has made the decision to keep things as is and if needed we will use bipap, per Quinn Axe, NP.

## 2017-02-25 ENCOUNTER — Inpatient Hospital Stay (HOSPITAL_COMMUNITY): Payer: Medicare Other

## 2017-02-25 LAB — CBC WITH DIFFERENTIAL/PLATELET
Basophils Absolute: 0 10*3/uL (ref 0.0–0.1)
Basophils Relative: 0 %
EOS ABS: 0.1 10*3/uL (ref 0.0–0.7)
Eosinophils Relative: 1 %
HEMATOCRIT: 28.2 % — AB (ref 36.0–46.0)
HEMOGLOBIN: 8.1 g/dL — AB (ref 12.0–15.0)
LYMPHS PCT: 20 %
Lymphs Abs: 1.1 10*3/uL (ref 0.7–4.0)
MCH: 24.8 pg — ABNORMAL LOW (ref 26.0–34.0)
MCHC: 28.7 g/dL — AB (ref 30.0–36.0)
MCV: 86.5 fL (ref 78.0–100.0)
MONOS PCT: 21 %
Monocytes Absolute: 1.2 10*3/uL — ABNORMAL HIGH (ref 0.1–1.0)
NEUTROS ABS: 3.2 10*3/uL (ref 1.7–7.7)
NEUTROS PCT: 58 %
Platelets: 96 10*3/uL — ABNORMAL LOW (ref 150–400)
RBC: 3.26 MIL/uL — ABNORMAL LOW (ref 3.87–5.11)
RDW: 17.3 % — AB (ref 11.5–15.5)
WBC: 5.6 10*3/uL (ref 4.0–10.5)

## 2017-02-25 LAB — RENAL FUNCTION PANEL
ANION GAP: 12 (ref 5–15)
Albumin: 2 g/dL — ABNORMAL LOW (ref 3.5–5.0)
BUN: 34 mg/dL — ABNORMAL HIGH (ref 6–20)
CHLORIDE: 101 mmol/L (ref 101–111)
CO2: 28 mmol/L (ref 22–32)
Calcium: 8 mg/dL — ABNORMAL LOW (ref 8.9–10.3)
Creatinine, Ser: 5.12 mg/dL — ABNORMAL HIGH (ref 0.44–1.00)
GFR calc non Af Amer: 8 mL/min — ABNORMAL LOW (ref >60)
GFR, EST AFRICAN AMERICAN: 9 mL/min — AB (ref >60)
GLUCOSE: 77 mg/dL (ref 65–99)
POTASSIUM: 4 mmol/L (ref 3.5–5.1)
Phosphorus: 4.4 mg/dL (ref 2.5–4.6)
Sodium: 141 mmol/L (ref 135–145)

## 2017-02-25 LAB — GLUCOSE, CAPILLARY
Glucose-Capillary: 76 mg/dL (ref 65–99)
Glucose-Capillary: 74 mg/dL (ref 65–99)

## 2017-02-25 MED ORDER — SODIUM CHLORIDE 0.9 % IV SOLN
1.0000 mg/h | INTRAVENOUS | Status: DC
  Administered 2017-02-25: 3 mg/h via INTRAVENOUS
  Administered 2017-02-25: 4 mg/h via INTRAVENOUS
  Administered 2017-02-25: 1 mg/h via INTRAVENOUS
  Administered 2017-02-25: 5 mg/h via INTRAVENOUS
  Administered 2017-02-25: 2 mg/h via INTRAVENOUS
  Filled 2017-02-25: qty 10

## 2017-02-25 MED ORDER — ALBUMIN HUMAN 25 % IV SOLN
50.0000 g | Freq: Once | INTRAVENOUS | Status: DC
  Filled 2017-02-25: qty 200

## 2017-02-25 MED ORDER — ALBUTEROL SULFATE (2.5 MG/3ML) 0.083% IN NEBU: 2.5000 mg | INHALATION_SOLUTION | RESPIRATORY_TRACT | Status: DC | PRN

## 2017-02-25 MED ORDER — LORAZEPAM 2 MG/ML IJ SOLN
0.2500 mg | Freq: Four times a day (QID) | INTRAMUSCULAR | Status: DC | PRN
  Administered 2017-02-25: 0.25 mg via INTRAVENOUS
  Filled 2017-02-25: qty 1

## 2017-02-25 MED ORDER — MORPHINE SULFATE (PF) 4 MG/ML IV SOLN
3.0000 mg | Freq: Two times a day (BID) | INTRAVENOUS | Status: DC | PRN
Start: 2017-02-25 — End: 2017-02-26
  Administered 2017-02-25: 3 mg via INTRAVENOUS
  Filled 2017-02-25: qty 1

## 2017-02-25 NOTE — Progress Notes (Signed)
Subjective: Interval History: Patient is sleepy but arousable. She continued to moan when she wakes up. At this moment unable to get any additional information.  Objective: Vital signs in last 24 hours: Temp:  [97.8 F (36.6 C)-98.4 F (36.9 C)] 98 F (36.7 C) (09/29 0713) Pulse Rate:  [60-69] 63 (09/29 0200) Resp:  [8-21] 9 (09/29 0713) BP: (75-110)/(31-63) 93/45 (09/29 0200) SpO2:  [92 %-100 %] 97 % (09/29 0746) Weight:  [69 kg (152 lb 1.9 oz)-69.2 kg (152 lb 8.9 oz)] 69 kg (152 lb 1.9 oz) (09/29 0400) Weight change: 0.1 kg (3.5 oz)  Intake/Output from previous day: 09/28 0701 - 09/29 0700 In: 53 [I.V.:3; IV Piggyback:50] Out: -600  Intake/Output this shift: No intake/output data recorded.  Generally patient is sleepy but arousable Chest: Decreased breath sound otherwise seems to be clear Heart exam revealed regular rate and rhythm Extremities no edema. She has left leg amputation  Lab Results:  Recent Labs  02/24/17 0428 02/25/17 0426  WBC 2.3* 5.6  HGB 7.7* 8.1*  HCT 27.0* 28.2*  PLT 79* 96*   BMET:   Recent Labs  02/24/17 0428 02/25/17 0426  NA 138 141  K 3.8 4.0  CL 98* 101  CO2 28 28  GLUCOSE 86 77  BUN 28* 34*  CREATININE 4.02* 5.12*  CALCIUM 7.7* 8.0*   No results for input(s): PTH in the last 72 hours. Iron Studies: No results for input(s): IRON, TIBC, TRANSFERRIN, FERRITIN in the last 72 hours.  Studies/Results: No results found.  I have reviewed the patient's current medications.  Assessment/Plan: Problem #1 difficulty breathing: Possibly multifactorial. Patient is on oxygen Does not seems to be in significant distress. Problem #2 end-stage renal disease: [name] status post hemodialysis on Wednesday . An attempt was made to do dialysis yesterday but her systolic blood pressure was very low in upper 60s and low 70s and hence dialysis was postponed. Presently her systolic blood pressures in 90s. Still low but seems to be better. Problem #3  anemia: Her hemoglobin is below target goal but improving. Patient with pancytopenia her hemoglobin remains stable. Presently not on Epogen.. Problem #4 Squamous cell cancer for lung: [name] on chemotherapy Problem #5 bone and mineral disorder: Her calcium  And phosphorus is in range. Problem #6 severe malnutrition. Patient remains somnolent . Her albumin remains very low. Problem #7 pneumonia: Patient on antibiotics. [name] a febrile  Patient overall was poor prognoses. [name] DO NOT RESUSCITATE. Patient may benefit from comfort only.  Plan: 1]We'll make arrangements for patient to get dialysis today if her blood pressure tolerates. 2] we'll use albumin during dialysis. 3] we will force her to use pressure support during dialysis but I don't think that will change her prognosis. No family member was around this morning. According to the palliative care note still families didn't consider to put her under hospice. 4] We'll check her CBC and renal panel in the morning.   LOS: 6 days   Hubert Derstine S 02/25/2017,8:40 AM

## 2017-02-25 NOTE — Progress Notes (Addendum)
PROGRESS NOTE    [name]  HUD:[id number] DOB: 10/16/1945 DOA: 02/03/2017 PCP: [name], MD    Brief Narrative:  [name]  is a 71 y.o. 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 She was brought to hospital with altered mental status and fever. In the ED she was febrile with a temperature of 102.5 and with a low-normal blood pressures. Her white blood cell count was 17.9. Her chest x-ray revealed developing infiltrate/atelectasis right lung base and left hilar mass without change since prior study. Blood cultures were ordered and broad-spectrum antibiotics were started. She was given IV fluid boluses for  sepsis from healthcare associated pneumonia. She subsequently developed pulmonary edema which this is a dictated treatment with BiPAP and emergent hemodialysis. Her mental status improved initially, but she has been minimally responsive and continues to moan with encephalopathy with or without evidence of pain. Palliative care, Ms. [name] was consulted and met with the family on 9/26 for goals of care. Her status was changed to a DNR. Her prognosis is poor and clinical management will depend on her status over the next few days. Per Ms. Dove on 9/28, the family is still undecided about comfort care which is what has been recommended. Per nephrology, hemodialysis has been problematic due to her relative hypotension.  Assessment & Plan:   Principal Problem:   Sepsis with metabolic encephalopathy ([place]) Active Problems:   Healthcare-associated pneumonia   Squamous cell carcinoma of lung, stage IV (HCC)   ESRD (end stage renal disease) (HCC)   Hypothyroidism   Goals of care, counseling/discussion   Palliative care encounter   Diabetes mellitus with renal complications ([place])   Antineoplastic chemotherapy induced pancytopenia ([place])   1. Sepsis  secondary to healthcare associated pneumonia causing acute respiratory failure with hypoxia and metabolic acidosis. Patient was started on IV antibiotics with Zosyn and vancomycin. She was bolused with IV fluids in the ED which subsequently led to pulmonary edema. She was started on BiPAP and given emergent hemodialysis which improved her respiratory status. -Her hypoxia and sepsis parameters have improved. Her white blood cell count improved, but became leukopenic. Her fever has resolved. Blood cultures negative to date. -Vancomycin and Zosyn discontinued due to pancytopenia and resolution of sepsis and negative MRSA screen.  -Antibiotic treatment narrowed to Rocephin on 9/28.Marland Kitchen [name] on day #7 of 10 antibiotic treatment.   Hypotension. Patient's blood pressure periodically falls into the 85Y systolically. She had been given several boluses and IV albumin ordered with hemodialysis. Over hydrating her may lead to recurrent acute pulmonary edema. - Oral Midodrine has also been ordered when necessary with hemodialysis. -I do not believe that the patient is a candidate for IV pressors. -Her prognosis is likely days to a couple of weeks.   Anemia of chronic disease/pancytopenia with leukopenia-thrombocytopenia-anemia.  Per chart review, the patient has a history of anemia, thought to be secondary to lung cancer and ESRD. Baseline range appears to be 7-10. It was 8.1 on admission. It fell to a nadir of 7.5. Her platelet count was within normal limit on admission, but had fallen precipitously. Her white blood cell count was elevated on admission, but had fallen to the leukopenic range of 1.1-2.3.  -Patient had been on chemotherapy, but her platelet count and white blood cell count were apparently within normal limits, so pancytopenia may be impart due to sepsis, possibly antibiotics  and with a contribution from heparin causing thrombocytopenia. -Apparently she is scheduled for Neulasta every 3  weeks. -Heparin was discontinued and SCDs started for DVT prophylaxis. Vancomycin and Zosyn were discontinued and antibiotic narrowed to Rocephin only. -Her white blood cell count, platelet count, and hemoglobin have improved. We'll continue to monitor.  Diabetes mellitus with nephropathy. Patient is treated chronically with Trajenta, Lantus and Humalog. -Trajenta is on hold. She was started on Lantus and sliding scale NovoLog. -[name] eating very little and her CBGs are on the lower end of normal. Therefore, Lantus was discontinued. -We'll discontinue SSI.  End-stage renal disease. Patient is being followed by nephrology. She continues to be treated with hemodialysis for now. -Her systolic blood pressure has been in the 75-95 range making it difficult for hemodialysis. Nephrology agreed that the patient would benefit from comfort care, but until that decision, hemodialysis will be tried. -Midodrine ordered when necessary with HD.  History of hypertension. Patient is treated chronically with amlodipine. It is being held due to low-normal blood pressures.  Hypothyroidism. She was continued on Synthroid. Currently stable.  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 The patient is on chemotherapy, currently on hold due to sepsis and poor prognosis. Her prognosis appears to be grave. CT of abdomen/pelvis on 9/21 revealed interval worsening of numerous liver metastases. Patient appears to be a candidate for comfort care; depending on family's wishes. -Palliative care was consulted and is following. DO NOT RESUSCITATE status established. -We'll try to follow-up with the family over the weekend to get an update of their decision.  Acute encephalopathy. Per previous report, the patient's encephalopathy had been subsiding, but she still appears to have some confusion. -TSH, ammonia level, B12 were assessed. TSH was within normal limits. B12 was elevated. Ammonia was  negative. -We'll order noncontrasted head CT for evaluation. If there is evidence of metastatic disease, that would likely help the family make their decision toward comfort care.     DVT prophylaxis: SCDs Code Status: DO NOT RESUSCITATE Family Communication: Next of kin not available. Disposition Plan: Disposition depends on clinical course and the patient with long-term poor prognosis   Consultants:   Nephrology  Palliative care  Procedures:   Hemodialysis  BiPAP  Antimicrobials:   Rocephin 9/28>>  Zosyn 9/23>>9//28  Vancomycin 9/24>> 9/28   Subjective: Patient is sleeping. When her name is called, she opens her eyes, but moans. She does not follow commands. Nursing reports she is not eating.  Objective: Vitals:   02/25/17 0200 02/25/17 0400 02/25/17 0713 02/25/17 0746  BP: (!) 93/45     Pulse: 63     Resp: 10  (!) 9   Temp:  98 F (36.7 C) 98 F (36.7 C)   TempSrc:  Oral Axillary   SpO2: 100%   97%  Weight:  69 kg (152 lb 1.9 oz)    Height:        Intake/Output Summary (Last 24 hours) at 02/25/17 0855 Last data filed at 02/24/17 2226  Gross per 24 hour  Intake               53 ml  Output             -600 ml  Net              653 ml   Filed Weights   02/24/17 0553 02/24/17 0955 02/25/17 0400  Weight: 69.1 kg (152 lb 5.4 oz) 69.2 kg (152 lb 8.9 oz)  69 kg (152 lb 1.9 oz)    Examination:  General exam: 71 year old African-American woman who Is initially asleep and appears calm, but when she was aroused, starts moaning.  Respiratory system: Clear anteriorly, decreased breath sounds in the bases.Marland Kitchen Respiratory effort normal. Cardiovascular system: S1 & S2 with  soft systolic murmur.. No pedal edema. Gastrointestinal system: Abdomen is nondistended, soft and nontender. No organomegaly or masses felt. Normal bowel sounds heard. Central nervous system: [name] sleepy, but opens her eyes to her name, otherwise, not responsive. Extremities: Left BKA; trace  pedal edema on the left. Skin: No rashes, lesions or ulcers Psychiatry: Judgement and insight difficult to assess. Mood & affect  lethargic.    Data Reviewed: I have personally reviewed following labs and imaging studies  CBC:  Recent Labs Lab 02/12/2017 0034  02/21/17 0425 02/22/17 0438 02/23/17 0401 02/24/17 0428 02/25/17 0426  WBC 17.9*  < > 2.1* 1.1* 1.1* 2.3* 5.6  NEUTROABS 16.6*  --   --  0.3* 0.2* 0.8* 3.2  HGB 8.1*  < > 7.8* 7.3* 7.5* 7.7* 8.1*  HCT 26.6*  < > 25.8* 25.2* 25.5* 27.0* 28.2*  MCV 83.1  < > 83.5 84.8 85.9 86.3 86.5  PLT 208  < > SPECIMEN CHECKED FOR CLOTS 88* 78* 79* 96*  < > = values in this interval not displayed. Basic Metabolic Panel:  Recent Labs Lab 02/20/17 1642 02/21/17 0425 02/22/17 0438 02/23/17 0401 02/24/17 0428 02/25/17 0426  NA 141 140 140 143 138 141  K 4.4 4.0 4.1 3.6 3.8 4.0  CL 102 99* 100* 100* 98* 101  CO2 _0 GLUCOSE 76 154* 95 123* 86 77  BUN 50* 26* 40* 17 28* 34*  CREATININE 6.31* 3.24* 4.91* 2.69* 4.02* 5.12*  CALCIUM 7.2* 7.4* 7.5* 8.2* 7.7* 8.0*  PHOS 6.2* 3.4  --   --  3.7 4.4   GFR: Estimated Creatinine Clearance: 9.4 mL/min (A) (by C-G formula based on SCr of 5.12 mg/dL (H)). Liver Function Tests:  Recent Labs Lab 02/17/2017 0034 02/20/17 1642 02/21/17 0425 02/24/17 0428 02/25/17 0426  AST 139*  --   --   --   --   ALT 12*  --   --   --   --   ALKPHOS 181*  --   --   --   --   BILITOT 0.8  --   --   --   --   PROT 7.0  --   --   --   --   ALBUMIN 2.3* 1.9* 1.9* 1.8* 2.0*   No results for input(s): LIPASE, AMYLASE in the last 168 hours.  Recent Labs Lab 02/24/17 0838  AMMONIA 31   Coagulation Profile: No results for input(s): INR, PROTIME in the last 168 hours. Cardiac Enzymes: No results for input(s): CKTOTAL, CKMB, CKMBINDEX, TROPONINI in the last 168 hours. BNP (last 3 results) No results for input(s): PROBNP in the last 8760 hours. HbA1C: No results for input(s): HGBA1C in  the last 72 hours. CBG:  Recent Labs Lab 02/24/17 0755 02/24/17 1146 02/24/17 1627 02/24/17 2122 02/25/17 0714  GLUCAP 73 100* 77 64* 76   Lipid Profile: No results for input(s): CHOL, HDL, LDLCALC, TRIG, CHOLHDL, LDLDIRECT in the last 72 hours. Thyroid Function Tests:  Recent Labs  02/24/17 0838  TSH 2.992   Anemia Panel:  Recent Labs  02/24/17 0838  VITAMINB12 1,437*   Sepsis Labs:  Recent Labs Lab 02/08/2017 0041 02/07/2017  0349  LATICACIDVEN 1.52 1.15    Recent Results (from the past 240 hour(s))  Blood Culture (routine x 2)     Status: None   Collection Time: 02/18/2017 12:40 AM  Result Value Ref Range Status   Specimen Description BLOOD LEFT HAND  Final   Special Requests   Final    BOTTLES DRAWN AEROBIC AND ANAEROBIC Blood Culture adequate volume   Culture NO GROWTH 5 DAYS  Final   Report Status 02/24/2017 FINAL  Final  Blood Culture (routine x 2)     Status: None   Collection Time: 02/09/2017 12:48 AM  Result Value Ref Range Status   Specimen Description RIGHT ANTECUBITAL  Final   Special Requests   Final    BOTTLES DRAWN AEROBIC ONLY Blood Culture results may not be optimal due to an inadequate volume of blood received in culture bottles   Culture NO GROWTH 5 DAYS  Final   Report Status 02/24/2017 FINAL  Final  MRSA PCR Screening     Status: None   Collection Time: 01/30/2017  7:00 AM  Result Value Ref Range Status   MRSA by PCR NEGATIVE NEGATIVE Final    Comment:        The GeneXpert MRSA Assay (FDA approved for NASAL specimens only), is one component of a comprehensive MRSA colonization surveillance program. It is not intended to diagnose MRSA infection nor to guide or monitor treatment for MRSA infections.          Radiology Studies: No results found.      Scheduled Meds: . insulin aspart  0-9 Units Subcutaneous TID WC  . levothyroxine  75 mcg Oral QAC breakfast  . sodium chloride flush  3 mL Intravenous Q12H   Continuous  Infusions: . sodium chloride    . sodium chloride    . sodium chloride    . sodium chloride    . sodium chloride    . sodium chloride    . sodium chloride    . albumin human    . cefTRIAXone (ROCEPHIN)  IV Stopped (02/24/17 1330)     LOS: 6 days    Time spent: 30 minutes    Rexene Alberts, MD [facility] Pager [phone]  If 7PM-7AM, please contact night-coverage [email or web address] Password [email or web address] 02/25/2017, 8:55 AM

## 2017-02-25 NOTE — Progress Notes (Addendum)
ADDENDUM:  I discussed the patient's grave prognosis with [name], her brother and primary caregiver, (via phone call) and 3 sisters who were in the patient's room. I reviewed her clinical course and the conversations that palliative care nurse practitioner, Ms. Dove had with Mallie Mussel. Mallie Mussel and his sisters all voiced that they did not want the patient to suffer and they knew that her end was near. They all acknowledged that she would not likely live much longer. Comfort care was gently advised. They were in agreement. They were also interested in the patient being transferred to hospice home whenever a bed  became available.  Plan: 1. Will discontinue all medications that are not conducive to comfort. 2. Will start a morphine drip titrated to comfort. Will order as needed Ativan, as needed albuterol nebulizer, and continue as needed Zofran. 3. Will consult social work for hospice referral.

## 2017-02-27 NOTE — Plan of Care (Signed)
Problem: Education: Goal: Knowledge of [place] [facility] Education information/materials will improve Outcome: Not Met (add Reason) END OF LIFE;COMFORT CARE  Problem: Health Behavior/Discharge Planning: Goal: Ability to manage health-related needs will improve Outcome: Not Met (add Reason) END OF LIFE;COMFORT CARE  Problem: Pain Managment: Goal: General experience of comfort will improve Outcome: Completed/Met Date Met: 03/02/2017 MORPHINE DRIP FOR COMFORT CARE  Problem: Physical Regulation: Goal: Ability to maintain clinical measurements within normal limits will improve Outcome: Not Met (add Reason) END OF LIFE;COMFORT CARE  Problem: Tissue Perfusion: Goal: Risk factors for ineffective tissue perfusion will decrease Outcome: Not Met (add Reason) END OF LIFE;COMFORT CARE  Problem: Fluid Volume: Goal: Ability to maintain a balanced intake and output will improve Outcome: Not Met (add Reason) END OF LIFE CARE;COMFORT CARE  Problem: Nutrition: Goal: Adequate nutrition will be maintained Outcome: Not Met (add Reason) END OF LIFE;COMFORT CARE

## 2017-02-27 NOTE — Progress Notes (Signed)
Remainder of morphine (approximately 188mL) wasted with Perfecto Kingdom RN.

## 2017-02-27 NOTE — Progress Notes (Signed)
Attempted to contact Grandson on both provided phones numbers with no success. [name], brother, was contacted on provided phone wit no success. Successfully contacted Laverda Sorenson, brother, and he is to be coming to the hospital to make final decisions with other family members.

## 2017-02-27 NOTE — Death Summary Note (Signed)
Physician Discharge Summary  [name] YCX:[id number] DOB: Oct 18, 1945 DOA: 02/22/2017  PCP: [name], MD  Admit date: 02/13/2017 Discharge date: 02-Mar-2017   Discharge Diagnoses:  Principal Problem:   Sepsis with metabolic encephalopathy ([place]) Active Problems:   Healthcare-associated pneumonia   Squamous cell carcinoma of lung, stage IV (HCC)   ESRD (end stage renal disease) ([place])   Hypothyroidism   Goals of care, counseling/discussion   Palliative care encounter   Diabetes mellitus with renal complications [facility])   Antineoplastic chemotherapy induced pancytopenia ([place])    Filed Weights   02/24/17 0955 02/25/17 0400 Mar 02, 2017 0727  Weight: 69.2 kg (152 lb 8.9 oz) 69 kg (152 lb 1.9 oz) 69 kg (152 lb 1.9 oz)    History of present illness:  MarthaSettlewas a 71 y.o.female,with a hx of COPD, squamous cell lung cancer stage IV on chemotherapy, hypothyroidism, end-stage renal disease on hemodialysis Monday Wednesday Friday, and diabetes mellitus. She was brought to hospital with altered mental status and fever. In the ED she was febrile with a temperature of 102.5 and with a low-normal blood pressures. Her white blood cell count was 17.9. Her chest x-ray revealed developing infiltrate/atelectasis right lung base and left hilar mass without change since prior study. Blood cultures were ordered and broad-spectrum antibiotics were started.  Hospital Course:  Patient was started on IV antibiotics with Zosyn and vancomycin. She was bolused with IV fluids in the ED which subsequently led to pulmonary edema. She was started on BiPAP and given emergent hemodialysis which improved her respiratory status. -Her hypoxiaImproved. She developed  leukopenia after her initial WBC was elevated.Marland Kitchen Her fever had resolved. Blood cultures were negative. -Vancomycin and Zosyn were discontinued due to pancytopenia and resolution of sepsis and negative MRSA screen.  -Antibiotic treatment was narrowed to  Rocephin on 9/28. -Although some parameters improved, the patient did not clinically as she barely ate or drank any liquids; she continued to be hypotensive which limited hemodialysis, and she continued to be encephalopathic. She was bolused IV fluids on admission, but this led to acute pulmonary edema which necessitated emergent hemodialysis. Therefore, restarting IV fluids would have been problematic. -Throughout the hospitalization, hemodialysis was limited due to the patient's hypotension. Midorine was ordered as needed with HD. She was not believed to be a candidate for IV pressors. -Patient was recently diagnosed with stage IV lung cancer with metastasis to the liver. Abdominal/pelvis CT 09/21 revealed interval worsening of numerous liver metastasis. It was felt that she was appropriate for palliative care or comfort care. -Patient moaned throughout the hospitalization. She was given IV morphine as needed and palliative care was consulted. -Due to her metastatic stage IV lung cancer and failing to thrive with treatment, palliative care, Ms. Hulan Fray had several conversations with her family, namely her primary caretaker and brother Mallie Mussel. Following their conversation, the patient's status was changed to DNR. Of note, nephrologist, Dr. Lowanda Foster agreed with the DNR status and pursuit of comfort care. -The dictating physician had a follow-up discussion with Mallie Mussel and the patient's other siblings regarding her grave prognosis and her ongoing encephalopathy and moaning. I reviewed her clinical course and the conversations that palliative care nurse practitioner, Ms. Dove had with Mallie Mussel. Mallie Mussel and his sisters all voiced that they did not want the patient to suffer and they knew that her end was near. They all acknowledged that she would not likely live much longer. Comfort care was gently advised. They were in agreement. They were also interested in the patient being  transferred to hospice home whenever a bed is  available. -All medications that were not conducive to comfort were discontinued. A morphine drip was started and titrated to comfort. -On the morning of 03-02-17, the patient passed away. Primary cause of death was sepsis secondary to pneumonia and stage IV metastatic lung cancer.    Procedures:  Hemodialysis  BiPAP  Consultations:  Nephrology  Palliative care.  Discharge Exam: Vitals:   02/25/17 2139 02-Mar-2017 0457  BP: (!) 87/39 (!) 75/27  Pulse: 90 72  Resp: (!) 3 (!) 2  Temp: 98.7 F (37.1 C) 99 F (37.2 C)  SpO2: 100% 99%    Discharge Instructions    Current Discharge Medication List    CONTINUE these medications which have NOT CHANGED   Details  acetaminophen (TYLENOL) 500 MG tablet Take 2,000 mg by mouth 3 (three) times daily as needed for moderate pain or headache.    amLODipine (NORVASC) 5 MG tablet Take 5 mg by mouth daily.    b complex-vitamin c-folic acid (NEPHRO-VITE) 0.8 MG TABS tablet Take 1 tablet by mouth daily.    calcium acetate (PHOSLO) 667 MG capsule Take 1 capsule (667 mg total) by mouth 2 (two) times daily with a meal.    dexamethasone (DECADRON) 4 MG tablet Take 2 tablets (8 mg total) by mouth daily. Start the day after chemotherapy for 2 days. 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     insulin glargine (LANTUS) 100 UNIT/ML injection Inject 0.1 mLs (10 Units total) into the skin at bedtime. Qty: 10 mL, Refills: 0    insulin lispro (HUMALOG) 100 UNIT/ML injection Inject 5 Units into the skin daily after breakfast.     levothyroxine (SYNTHROID, LEVOTHROID) 75 MCG tablet Take 75 mcg by mouth daily.      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    metoCLOPramide (REGLAN) 5 MG tablet Take 1-2 tablets (5-10 mg total) by mouth every 8 (eight) hours as needed (if ondansetron (ZOFRAN) ineffective.).    Multiple Vitamins-Minerals (RENAL) TABS Take 1 tablet by mouth daily.    ondansetron (ZOFRAN) 8 MG tablet Take 1 tablet (8 mg total) by mouth every 8 (eight) hours as needed for nausea or vomiting. Qty: 30 tablet, Refills: 2    prochlorperazine (COMPAZINE) 10 MG tablet Take 1 tablet (10 mg total) by mouth every 6 (six) hours as needed (Nausea or vomiting). 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 Every 3 weeks    PACLitaxel (TAXOL IV) Inject into the vein. Every 3 weeks    Pegfilgrastim (NEULASTA ONPRO Jette) Inject into the skin. Every 3 weeks       Allergies  Allergen Reactions  . Contrast Media [Iodinated Diagnostic Agents] Itching  . Latex Itching      The results of significant diagnostics from this hospitalization (including imaging, microbiology, ancillary and laboratory) are listed below for reference.    Significant Diagnostic Studies: Ct Abdomen Pelvis Wo Contrast  Result Date: 02/17/2017 CLINICAL DATA:  Abdominal distention. History of metastatic  lung cancer. EXAM: CT ABDOMEN AND PELVIS WITHOUT CONTRAST TECHNIQUE: Multidetector CT imaging of the abdomen and pelvis was performed following the standard protocol without IV contrast. COMPARISON:  07/06/2016 and 06/27/2016 as well as PET-CT 12/30/2016 FINDINGS: Lower chest: Lung bases unchanged. Calcified plaque over the coronary arteries unchanged. Calcified plaque over the descending thoracic aorta. Hepatobiliary: Interval worsening of numerous hypodense liver metastases. Previous cholecystectomy. Biliary tree within normal. Pancreas: Within normal. Spleen: Within normal. Adrenals/Urinary Tract: Adrenal glands normal.  Kidneys are normal in size with multiple hypo and hyperdense renal cortical lesions such unchanged likely simple and hemorrhagic cysts. No hydronephrosis. Several vascular calcifications over the corticomedullary junction bilaterally. Ureters and bladder are normal. Stomach/Bowel: Stomach and small bowel are normal. The appendix is normal. There is diverticulosis of the colon. Vascular/Lymphatic: Moderate calcified plaque over the abdominal aorta and iliac arteries. No definite adenopathy. Reproductive: Unremarkable. Other: No significant free fluid or focal inflammatory change. Musculoskeletal: Mild degenerative change of the spine and hips. Intramedullary rod over the left femur. IMPRESSION: No acute findings in the abdomen/pelvis. Interval worsening of numerous liver metastases from patient's known lung cancer. Multiple stable hyper and hypodense renal cortical lesions likely simple and hemorrhagic cysts. Colonic diverticulosis. Atherosclerotic coronary artery disease. Aortic Atherosclerosis (ICD10-I70.0). Electronically Signed   By: Marin Olp M.D.   On: 02/17/2017 18:35   Ct Head Wo Contrast  Result Date: 02/25/2017 CLINICAL DATA:  Encephalopathy.  History of lung cancer. EXAM: CT HEAD WITHOUT CONTRAST TECHNIQUE: Contiguous axial images were obtained from the base of the skull through the vertex without intravenous contrast. COMPARISON:  Brain MRI 02/11/2011 FINDINGS: Brain: There is no evidence of acute infarct, intracranial hemorrhage, mass, midline shift, or extra-axial fluid collection. Patchy cerebral white matter hypodensities are nonspecific but compatible with moderate chronic small vessel ischemic disease, progressed from the prior MRI. There is mild cerebral atrophy. Vascular: Calcified atherosclerosis at the skullbase. No hyperdense vessel. Skull: No fracture or suspicious osseous lesion. Sinuses/Orbits: Small volume secretions in the left sphenoid sinus. Clear mastoid air cells. Bilateral  cataract extraction. Other: None. IMPRESSION: 1. No evidence of acute intracranial abnormality. 2. Moderate chronic small vessel ischemic disease, progressed from 2012. Electronically Signed   By: Logan Bores M.D.   On: 02/25/2017 11:09   Dg Chest Portable 1 View  Result Date: 01/30/2017 CLINICAL DATA:  Altered mental status and difficulty breathing EXAM: PORTABLE CHEST 1 VIEW COMPARISON:  Chest radiographs 02/22/2017 FINDINGS: Unchanged appearance of left hilar mass. Developing opacities in the right lower lobe are unchanged. Heart remains enlarged. Mild pulmonary edema. Left subclavian approach Port-A-Cath is unchanged with tip in the lower SVC. IMPRESSION: Unchanged chest radiograph with left hilar mass and developing right basilar opacities. Mild pulmonary edema. Electronically Signed   By: Ulyses Jarred M.D.   On: 02/22/2017 03:27   Dg Chest Port 1 View  Result Date: 02/10/2017 CLINICAL DATA:  Altered mental status. Patient is warm to the touch. Labored breathing. Dialysis and cancer patient. EXAM: PORTABLE CHEST 1 VIEW COMPARISON:  02/13/2017 FINDINGS: Power port type central venous catheter with tip over the mid SVC region. No change in position since previous study. Cardiac enlargement. Left perihilar mass lesion is unchanged since previous study. Developing infiltration or atelectasis in the right lung base. This could indicate a developing pneumonia. No blunting of costophrenic angles. No pneumothorax. Surgical clips in the left axilla. Degenerative changes in the shoulders. IMPRESSION: Developing infiltration or atelectasis in the right lung base. Left hilar mass without change since prior  study. Cardiac enlargement. Electronically Signed   By: Lucienne Capers M.D.   On: 02/05/2017 01:28   Dg Chest Port 1 View  Result Date: 02/13/2017 CLINICAL DATA:  Left Port-A-Cath placement. EXAM: PORTABLE CHEST 1 VIEW COMPARISON:  PET-CT dated December 30, 2016. Chest x-ray dated May 06, 2017. FINDINGS:  Interval placement of a left chest wall port catheter with the tip projecting over the cavoatrial junction. The cardiomediastinal silhouette is normal in size. Known left upper lobe mass is grossly unchanged allowing for differences in technique. No pleural effusion or pneumothorax. No acute osseous abnormality. IMPRESSION: 1. Interval placement of a left port catheter with the tip near the cavoatrial junction. No pneumothorax. 2. Known left upper lobe mass is grossly unchanged allowing for differences in technique. Electronically Signed   By: Titus Dubin M.D.   On: 02/13/2017 12:39   Dg C-arm 1-60 Min-no Report  Result Date: 02/13/2017 Fluoroscopy was utilized by the requesting physician.  No radiographic interpretation.    Microbiology: Recent Results (from the past 240 hour(s))  Blood Culture (routine x 2)     Status: None   Collection Time: 02/24/2017 12:40 AM  Result Value Ref Range Status   Specimen Description BLOOD LEFT HAND  Final   Special Requests   Final    BOTTLES DRAWN AEROBIC AND ANAEROBIC Blood Culture adequate volume   Culture NO GROWTH 5 DAYS  Final   Report Status 02/24/2017 FINAL  Final  Blood Culture (routine x 2)     Status: None   Collection Time: 02/18/2017 12:48 AM  Result Value Ref Range Status   Specimen Description RIGHT ANTECUBITAL  Final   Special Requests   Final    BOTTLES DRAWN AEROBIC ONLY Blood Culture results may not be optimal due to an inadequate volume of blood received in culture bottles   Culture NO GROWTH 5 DAYS  Final   Report Status 02/24/2017 FINAL  Final  MRSA PCR Screening     Status: None   Collection Time: 02/24/2017  7:00 AM  Result Value Ref Range Status   MRSA by PCR NEGATIVE NEGATIVE Final    Comment:        The GeneXpert MRSA Assay (FDA approved for NASAL specimens only), is one component of a comprehensive MRSA colonization surveillance program. It is not intended to diagnose MRSA infection nor to guide or monitor treatment  for MRSA infections.      Labs: Basic Metabolic Panel:  Recent Labs Lab 02/20/17 1642 02/21/17 0425 02/22/17 0438 02/23/17 0401 02/24/17 0428 02/25/17 0426  NA 141 140 140 143 138 141  K 4.4 4.0 4.1 3.6 3.8 4.0  CL 102 99* 100* 100* 98* 101  CO2 24 29 26 30 28 28   GLUCOSE 76 154* 95 123* 86 77  BUN 50* 26* 40* 17 28* 34*  CREATININE 6.31* 3.24* 4.91* 2.69* 4.02* 5.12*  CALCIUM 7.2* 7.4* 7.5* 8.2* 7.7* 8.0*  PHOS 6.2* 3.4  --   --  3.7 4.4   Liver Function Tests:  Recent Labs Lab 02/20/17 1642 02/21/17 0425 02/24/17 0428 02/25/17 0426  ALBUMIN 1.9* 1.9* 1.8* 2.0*   No results for input(s): LIPASE, AMYLASE in the last 168 hours.  Recent Labs Lab 02/24/17 0838  AMMONIA 31   CBC:  Recent Labs Lab 02/21/17 0425 02/22/17 0438 02/23/17 0401 02/24/17 0428 02/25/17 0426  WBC 2.1* 1.1* 1.1* 2.3* 5.6  NEUTROABS  --  0.3* 0.2* 0.8* 3.2  HGB 7.8* 7.3* 7.5* 7.7* 8.1*  HCT  25.8* 25.2* 25.5* 27.0* 28.2*  MCV 83.5 84.8 85.9 86.3 86.5  PLT SPECIMEN CHECKED FOR CLOTS 88* 78* 79* 96*   Cardiac Enzymes: No results for input(s): CKTOTAL, CKMB, CKMBINDEX, TROPONINI in the last 168 hours. BNP: BNP (last 3 results) No results for input(s): BNP in the last 8760 hours.  ProBNP (last 3 results) No results for input(s): PROBNP in the last 8760 hours.  CBG:  Recent Labs Lab 02/24/17 1146 02/24/17 1627 02/24/17 2122 02/25/17 0714 02/25/17 1157  GLUCAP 100* 77 64* 76 74       Signed:  Armelia Penton MD.  [facility] March 01, 2017, 11:59 AM

## 2017-02-27 DEATH — deceased

## 2017-03-06 ENCOUNTER — Ambulatory Visit (HOSPITAL_COMMUNITY): Payer: Medicare Other

## 2017-03-06 ENCOUNTER — Ambulatory Visit (HOSPITAL_COMMUNITY): Payer: Medicare Other | Admitting: Oncology

## 2017-03-10 ENCOUNTER — Other Ambulatory Visit: Payer: Self-pay | Admitting: Nurse Practitioner
